# Patient Record
Sex: Female | Born: 1960 | Race: White | Hispanic: No | Smoking: Current every day smoker
Health system: Southern US, Community
[De-identification: ages and names within clinical notes are randomized; demographics above are authoritative.]

## PROBLEM LIST (undated history)

## (undated) DIAGNOSIS — K219 Gastro-esophageal reflux disease without esophagitis: Secondary | ICD-10-CM

## (undated) DIAGNOSIS — R0602 Shortness of breath: Secondary | ICD-10-CM

## (undated) DIAGNOSIS — Z8669 Personal history of other diseases of the nervous system and sense organs: Secondary | ICD-10-CM

## (undated) DIAGNOSIS — J449 Chronic obstructive pulmonary disease, unspecified: Secondary | ICD-10-CM

## (undated) DIAGNOSIS — K08109 Complete loss of teeth, unspecified cause, unspecified class: Secondary | ICD-10-CM

## (undated) DIAGNOSIS — F1010 Alcohol abuse, uncomplicated: Secondary | ICD-10-CM

## (undated) DIAGNOSIS — Z972 Presence of dental prosthetic device (complete) (partial): Secondary | ICD-10-CM

## (undated) DIAGNOSIS — B1920 Unspecified viral hepatitis C without hepatic coma: Secondary | ICD-10-CM

## (undated) DIAGNOSIS — M1990 Unspecified osteoarthritis, unspecified site: Secondary | ICD-10-CM

## (undated) DIAGNOSIS — Z8614 Personal history of Methicillin resistant Staphylococcus aureus infection: Secondary | ICD-10-CM

## (undated) DIAGNOSIS — F419 Anxiety disorder, unspecified: Secondary | ICD-10-CM

## (undated) DIAGNOSIS — G4700 Insomnia, unspecified: Secondary | ICD-10-CM

## (undated) HISTORY — PX: OTHER SURGICAL HISTORY: SHX169

## (undated) HISTORY — PX: SHOULDER SURGERY: SHX246

---

## 1988-12-21 HISTORY — PX: HAND SURGERY: SHX662

## 1988-12-21 HISTORY — PX: ORIF ULNAR / RADIAL SHAFT FRACTURE: SUR966

## 2005-06-29 ENCOUNTER — Emergency Department (HOSPITAL_COMMUNITY): Admission: EM | Admit: 2005-06-29 | Discharge: 2005-06-29 | Payer: Self-pay | Admitting: Emergency Medicine

## 2005-07-03 ENCOUNTER — Emergency Department (HOSPITAL_COMMUNITY): Admission: EM | Admit: 2005-07-03 | Discharge: 2005-07-03 | Payer: Self-pay | Admitting: Emergency Medicine

## 2005-12-06 ENCOUNTER — Emergency Department (HOSPITAL_COMMUNITY): Admission: EM | Admit: 2005-12-06 | Discharge: 2005-12-06 | Payer: Self-pay | Admitting: Emergency Medicine

## 2006-02-18 ENCOUNTER — Emergency Department (HOSPITAL_COMMUNITY): Admission: EM | Admit: 2006-02-18 | Discharge: 2006-02-18 | Payer: Self-pay | Admitting: Emergency Medicine

## 2008-07-08 ENCOUNTER — Emergency Department (HOSPITAL_BASED_OUTPATIENT_CLINIC_OR_DEPARTMENT_OTHER): Admission: EM | Admit: 2008-07-08 | Discharge: 2008-07-08 | Payer: Self-pay | Admitting: Emergency Medicine

## 2008-07-16 ENCOUNTER — Emergency Department (HOSPITAL_BASED_OUTPATIENT_CLINIC_OR_DEPARTMENT_OTHER): Admission: EM | Admit: 2008-07-16 | Discharge: 2008-07-16 | Payer: Self-pay | Admitting: Emergency Medicine

## 2008-10-18 ENCOUNTER — Emergency Department (HOSPITAL_BASED_OUTPATIENT_CLINIC_OR_DEPARTMENT_OTHER): Admission: EM | Admit: 2008-10-18 | Discharge: 2008-10-18 | Payer: Self-pay | Admitting: Emergency Medicine

## 2008-12-04 ENCOUNTER — Emergency Department (HOSPITAL_BASED_OUTPATIENT_CLINIC_OR_DEPARTMENT_OTHER): Admission: EM | Admit: 2008-12-04 | Discharge: 2008-12-04 | Payer: Self-pay | Admitting: Emergency Medicine

## 2009-05-26 ENCOUNTER — Emergency Department (HOSPITAL_BASED_OUTPATIENT_CLINIC_OR_DEPARTMENT_OTHER): Admission: EM | Admit: 2009-05-26 | Discharge: 2009-05-26 | Payer: Self-pay | Admitting: Emergency Medicine

## 2009-05-26 ENCOUNTER — Ambulatory Visit: Payer: Self-pay | Admitting: Diagnostic Radiology

## 2009-05-27 ENCOUNTER — Encounter: Admission: RE | Admit: 2009-05-27 | Discharge: 2009-05-27 | Payer: Self-pay | Admitting: Emergency Medicine

## 2009-06-03 ENCOUNTER — Emergency Department (HOSPITAL_BASED_OUTPATIENT_CLINIC_OR_DEPARTMENT_OTHER): Admission: EM | Admit: 2009-06-03 | Discharge: 2009-06-03 | Payer: Self-pay | Admitting: Emergency Medicine

## 2010-05-17 ENCOUNTER — Ambulatory Visit: Payer: Self-pay | Admitting: Diagnostic Radiology

## 2010-05-17 ENCOUNTER — Emergency Department (HOSPITAL_BASED_OUTPATIENT_CLINIC_OR_DEPARTMENT_OTHER): Admission: EM | Admit: 2010-05-17 | Discharge: 2010-05-17 | Payer: Self-pay | Admitting: Emergency Medicine

## 2010-05-21 ENCOUNTER — Emergency Department (HOSPITAL_COMMUNITY): Admission: EM | Admit: 2010-05-21 | Discharge: 2010-05-21 | Payer: Self-pay | Admitting: Family Medicine

## 2011-03-30 LAB — BASIC METABOLIC PANEL
CO2: 26 mEq/L (ref 19–32)
Creatinine, Ser: 0.7 mg/dL (ref 0.4–1.2)
GFR calc Af Amer: >60 mL/min (ref >60)
GFR calc non Af Amer: >60 mL/min (ref >60)
Glucose, Bld: 87 mg/dL (ref 70–99)

## 2011-03-30 LAB — POCT CARDIAC MARKERS
CKMB, poc: <1 ng/mL — ABNORMAL LOW (ref 1.0–8.0)
Myoglobin, poc: 19.7 ng/mL (ref 12–200)
Troponin i, poc: <0.05 ng/mL (ref 0.00–0.09)

## 2011-03-30 LAB — DIFFERENTIAL
Basophils Relative: 1 % (ref 0–1)
Eosinophils Relative: 2 % (ref 0–5)
Lymphocytes Relative: 25 % (ref 12–46)
Monocytes Relative: 6 % (ref 3–12)
Neutro Abs: 7.1 10*3/uL (ref 1.7–7.7)
Neutrophils Relative %: 66 % (ref 43–77)

## 2011-03-30 LAB — CBC
MCV: 96.6 fL (ref 78.0–100.0)
Platelets: 280 10*3/uL (ref 150–400)
RBC: 3.96 MIL/uL (ref 3.87–5.11)
RDW: 12.5 % (ref 11.5–15.5)
WBC: 10.8 10*3/uL — ABNORMAL HIGH (ref 4.0–10.5)

## 2013-08-02 ENCOUNTER — Emergency Department (HOSPITAL_BASED_OUTPATIENT_CLINIC_OR_DEPARTMENT_OTHER)
Admission: EM | Admit: 2013-08-02 | Discharge: 2013-08-02 | Disposition: A | Discharge status: Home / Self Care | Payer: Self-pay | Attending: Emergency Medicine | Admitting: Emergency Medicine

## 2013-08-02 ENCOUNTER — Encounter (HOSPITAL_BASED_OUTPATIENT_CLINIC_OR_DEPARTMENT_OTHER): Payer: Self-pay | Admitting: *Deleted

## 2013-08-02 DIAGNOSIS — F17200 Nicotine dependence, unspecified, uncomplicated: Secondary | ICD-10-CM | POA: Insufficient documentation

## 2013-08-02 DIAGNOSIS — J449 Chronic obstructive pulmonary disease, unspecified: Secondary | ICD-10-CM | POA: Insufficient documentation

## 2013-08-02 DIAGNOSIS — R112 Nausea with vomiting, unspecified: Secondary | ICD-10-CM | POA: Insufficient documentation

## 2013-08-02 DIAGNOSIS — Z79899 Other long term (current) drug therapy: Secondary | ICD-10-CM | POA: Insufficient documentation

## 2013-08-02 DIAGNOSIS — IMO0002 Reserved for concepts with insufficient information to code with codable children: Secondary | ICD-10-CM | POA: Insufficient documentation

## 2013-08-02 DIAGNOSIS — Z23 Encounter for immunization: Secondary | ICD-10-CM | POA: Insufficient documentation

## 2013-08-02 DIAGNOSIS — J4489 Other specified chronic obstructive pulmonary disease: Secondary | ICD-10-CM | POA: Insufficient documentation

## 2013-08-02 DIAGNOSIS — L0291 Cutaneous abscess, unspecified: Secondary | ICD-10-CM

## 2013-08-02 HISTORY — DX: Chronic obstructive pulmonary disease, unspecified: J44.9

## 2013-08-02 MED ORDER — OXYCODONE-ACETAMINOPHEN 5-325 MG PO TABS
2.0000 | ORAL_TABLET | Freq: Once | ORAL | Status: AC
  Administered 2013-08-02: 2 via ORAL
  Filled 2013-08-02 (×2): qty 2

## 2013-08-02 MED ORDER — SULFAMETHOXAZOLE-TRIMETHOPRIM 800-160 MG PO TABS: 1.0000 | ORAL_TABLET | Freq: Two times a day (BID) | ORAL | Status: DC

## 2013-08-02 MED ORDER — SULFAMETHOXAZOLE-TMP DS 800-160 MG PO TABS
1.0000 | ORAL_TABLET | Freq: Once | ORAL | Status: AC
  Administered 2013-08-02: 1 via ORAL
  Filled 2013-08-02: qty 1

## 2013-08-02 MED ORDER — ONDANSETRON 8 MG PO TBDP
8.0000 mg | ORAL_TABLET | Freq: Once | ORAL | Status: AC
  Administered 2013-08-02: 8 mg via ORAL
  Filled 2013-08-02: qty 1

## 2013-08-02 MED ORDER — LIDOCAINE HCL (PF) 1 % IJ SOLN
INTRAMUSCULAR | Status: AC
  Administered 2013-08-02: 05:00:00
  Filled 2013-08-02: qty 10

## 2013-08-02 MED ORDER — OXYCODONE-ACETAMINOPHEN 5-325 MG PO TABS: 2.0000 | ORAL_TABLET | ORAL | Status: DC | PRN

## 2013-08-02 MED ORDER — HYDROMORPHONE HCL PF 1 MG/ML IJ SOLN
1.0000 mg | Freq: Once | INTRAMUSCULAR | Status: AC
  Administered 2013-08-02: 1 mg via INTRAMUSCULAR
  Filled 2013-08-02: qty 1

## 2013-08-02 MED ORDER — TETANUS-DIPHTH-ACELL PERTUSSIS 5-2.5-18.5 LF-MCG/0.5 IM SUSP
0.5000 mL | Freq: Once | INTRAMUSCULAR | Status: AC
  Administered 2013-08-02: 0.5 mL via INTRAMUSCULAR
  Filled 2013-08-02: qty 0.5

## 2013-08-02 NOTE — ED Notes (Signed)
rx x 2 given for septra and percocet- pt has a ride

## 2013-08-02 NOTE — ED Provider Notes (Signed)
CSN: [id number]     Arrival date & time 08/02/13  4098 History     First MD Initiated Contact with Patient 08/02/13 0325     Chief Complaint  Patient presents with  . Wound Infection   (Consider location/radiation/quality/duration/timing/severity/associated sxs/prior Treatment) HPI Comments: Patient presents with a sore on her right arm. She says that it started out as a small pimple and then has progressed over the last 3 days. She's had some surrounding redness. She denies he fevers or chills. She's had some nausea and vomiting associated with the pain. She denies any history of skin infections in the past. She's unsure when her last tetanus shot was.   Past Medical History  Diagnosis Date  . COPD (chronic obstructive pulmonary disease)    Past Surgical History  Procedure Laterality Date  . Arm surgery    . Hand surgery    . Shoulder surgery     No family history on file. History  Substance Use Topics  . Smoking status: Current Every Day Smoker    Types: Cigarettes  . Smokeless tobacco: Never Used  . Alcohol Use: 1.8 oz/week    3 Glasses of wine per week   OB History   Grav Para Term Preterm Abortions TAB SAB Ect Mult Living                 Review of Systems  Constitutional: Negative for fever.  Gastrointestinal: Positive for nausea and vomiting.  Musculoskeletal: Negative for arthralgias.  Skin: Positive for wound. Negative for rash.  Neurological: Negative for dizziness and headaches.    Allergies  Review of patient's allergies indicates no known allergies.  Home Medications   Current Outpatient Rx  Name  Route  Sig  Dispense  Refill  . albuterol-ipratropium (COMBIVENT) 18-103 MCG/ACT inhaler   Inhalation   Inhale 2 puffs into the lungs every 6 (six) hours as needed for wheezing.         . tiotropium (SPIRIVA) 18 MCG inhalation capsule   Inhalation   Place 18 mcg into inhaler and inhale daily.         Marland Kitchen oxyCODONE-acetaminophen (PERCOCET) 5-325 MG  per tablet   Oral   Take 2 tablets by mouth every 4 (four) hours as needed for pain.   20 tablet   0   . sulfamethoxazole-trimethoprim (BACTRIM DS,SEPTRA DS) 800-160 MG per tablet   Oral   Take 1 tablet by mouth 2 (two) times daily. One po bid x 10 days   20 tablet   0    BP 121/93  Pulse 82  Temp(Src) 98.8 F (37.1 C) (Oral)  Resp 22  Ht 5' (1.524 m)  Wt 125 lb (56.7 kg)  BMI 24.41 kg/m2  SpO2 96% Physical Exam  Constitutional: She is oriented to person, place, and time. She appears well-developed and well-nourished.  Cardiovascular: Normal rate.   Pulmonary/Chest: Effort normal.  Neurological: She is alert and oriented to person, place, and time.  Skin: Skin is warm and dry.  There is a 2 cm indurated fluctuant area to the palmar surface of the right mid forearm. There is surrounding area. The amount about 4 cm in diameter. There is no streaking up the arm.    ED Course   INCISION AND DRAINAGE Date/Time: 08/02/2013 4:19 AM Performed by: Jestin Burbach Authorized by: Rolan Bucco Consent: Verbal consent obtained. Risks and benefits: risks, benefits and alternatives were discussed Consent given by: patient Type: abscess Body area: upper extremity Location details: right  arm Anesthesia: local infiltration Local anesthetic: lidocaine 1% without epinephrine Anesthetic total: 2 ml Patient sedated: no Scalpel size: 11 Incision type: elliptical Drainage: purulent Drainage amount: moderate Wound treatment: wound left open Patient tolerance: Patient tolerated the procedure well with no immediate complications.   (including critical care time)  Labs Reviewed - No data to display No results found. 1. Abscess     MDM  Patient started on antibiotics. She was given wound care instructions. Her tetanus shot was updated. She was advised to return in 2 days if her symptoms are not improved or sooner if she has any worsening redness or streaking up her arm.  Rolan Bucco, MD 08/02/13 [phone]

## 2013-08-02 NOTE — ED Notes (Signed)
Wound to right forearm x 3 days

## 2014-01-06 ENCOUNTER — Emergency Department (HOSPITAL_BASED_OUTPATIENT_CLINIC_OR_DEPARTMENT_OTHER): Payer: Self-pay

## 2014-01-06 ENCOUNTER — Emergency Department (HOSPITAL_BASED_OUTPATIENT_CLINIC_OR_DEPARTMENT_OTHER)
Admission: EM | Admit: 2014-01-06 | Discharge: 2014-01-06 | Disposition: A | Discharge status: Home / Self Care | Payer: Self-pay | Attending: Emergency Medicine | Admitting: Emergency Medicine

## 2014-01-06 ENCOUNTER — Encounter (HOSPITAL_BASED_OUTPATIENT_CLINIC_OR_DEPARTMENT_OTHER): Payer: Self-pay | Admitting: Emergency Medicine

## 2014-01-06 DIAGNOSIS — R079 Chest pain, unspecified: Secondary | ICD-10-CM | POA: Insufficient documentation

## 2014-01-06 DIAGNOSIS — Z79899 Other long term (current) drug therapy: Secondary | ICD-10-CM | POA: Insufficient documentation

## 2014-01-06 DIAGNOSIS — R609 Edema, unspecified: Secondary | ICD-10-CM | POA: Insufficient documentation

## 2014-01-06 DIAGNOSIS — J449 Chronic obstructive pulmonary disease, unspecified: Secondary | ICD-10-CM | POA: Insufficient documentation

## 2014-01-06 DIAGNOSIS — J4489 Other specified chronic obstructive pulmonary disease: Secondary | ICD-10-CM | POA: Insufficient documentation

## 2014-01-06 DIAGNOSIS — R0781 Pleurodynia: Secondary | ICD-10-CM

## 2014-01-06 DIAGNOSIS — F17200 Nicotine dependence, unspecified, uncomplicated: Secondary | ICD-10-CM | POA: Insufficient documentation

## 2014-01-06 MED ORDER — HYDROCODONE-ACETAMINOPHEN 5-325 MG PO TABS
1.0000 | ORAL_TABLET | Freq: Once | ORAL | Status: AC
  Administered 2014-01-06: 1 via ORAL
  Filled 2014-01-06: qty 1

## 2014-01-06 MED ORDER — HYDROCODONE-ACETAMINOPHEN 5-325 MG PO TABS: 1.0000 | ORAL_TABLET | Freq: Four times a day (QID) | ORAL | Status: DC | PRN
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
## 2014-01-06 NOTE — Discharge Instructions (Signed)
As discussed, it is important that you follow up with your physician for continued management of your condition.  In addition to the provided medication, please take ibuprofen, 800mg , three times daily for three days.  If you develop any new, or concerning changes in your condition, please return to the emergency department immediately.
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
## 2014-04-23 ENCOUNTER — Emergency Department (HOSPITAL_BASED_OUTPATIENT_CLINIC_OR_DEPARTMENT_OTHER)
Admission: EM | Admit: 2014-04-23 | Discharge: 2014-04-23 | Disposition: A | Discharge status: Home / Self Care | Payer: Self-pay | Attending: Emergency Medicine | Admitting: Emergency Medicine

## 2014-04-23 ENCOUNTER — Emergency Department (HOSPITAL_BASED_OUTPATIENT_CLINIC_OR_DEPARTMENT_OTHER): Payer: Self-pay

## 2014-04-23 ENCOUNTER — Encounter (HOSPITAL_BASED_OUTPATIENT_CLINIC_OR_DEPARTMENT_OTHER): Payer: Self-pay | Admitting: Emergency Medicine

## 2014-04-23 DIAGNOSIS — S301XXA Contusion of abdominal wall, initial encounter: Secondary | ICD-10-CM | POA: Insufficient documentation

## 2014-04-23 DIAGNOSIS — F411 Generalized anxiety disorder: Secondary | ICD-10-CM | POA: Insufficient documentation

## 2014-04-23 DIAGNOSIS — S3011XA Contusion of abdominal wall, initial encounter: Secondary | ICD-10-CM

## 2014-04-23 DIAGNOSIS — R071 Chest pain on breathing: Secondary | ICD-10-CM | POA: Insufficient documentation

## 2014-04-23 DIAGNOSIS — S060X9A Concussion with loss of consciousness of unspecified duration, initial encounter: Secondary | ICD-10-CM | POA: Insufficient documentation

## 2014-04-23 DIAGNOSIS — Z79899 Other long term (current) drug therapy: Secondary | ICD-10-CM | POA: Insufficient documentation

## 2014-04-23 DIAGNOSIS — S4980XA Other specified injuries of shoulder and upper arm, unspecified arm, initial encounter: Secondary | ICD-10-CM | POA: Insufficient documentation

## 2014-04-23 DIAGNOSIS — S52202A Unspecified fracture of shaft of left ulna, initial encounter for closed fracture: Secondary | ICD-10-CM

## 2014-04-23 DIAGNOSIS — Y9389 Activity, other specified: Secondary | ICD-10-CM | POA: Insufficient documentation

## 2014-04-23 DIAGNOSIS — Z792 Long term (current) use of antibiotics: Secondary | ICD-10-CM | POA: Insufficient documentation

## 2014-04-23 DIAGNOSIS — IMO0002 Reserved for concepts with insufficient information to code with codable children: Secondary | ICD-10-CM | POA: Insufficient documentation

## 2014-04-23 DIAGNOSIS — S0280XA Fracture of other specified skull and facial bones, unspecified side, initial encounter for closed fracture: Secondary | ICD-10-CM | POA: Insufficient documentation

## 2014-04-23 DIAGNOSIS — S20219A Contusion of unspecified front wall of thorax, initial encounter: Secondary | ICD-10-CM

## 2014-04-23 DIAGNOSIS — J441 Chronic obstructive pulmonary disease with (acute) exacerbation: Secondary | ICD-10-CM | POA: Insufficient documentation

## 2014-04-23 DIAGNOSIS — Y92410 Unspecified street and highway as the place of occurrence of the external cause: Secondary | ICD-10-CM | POA: Insufficient documentation

## 2014-04-23 DIAGNOSIS — S5000XA Contusion of unspecified elbow, initial encounter: Secondary | ICD-10-CM | POA: Insufficient documentation

## 2014-04-23 DIAGNOSIS — S52209A Unspecified fracture of shaft of unspecified ulna, initial encounter for closed fracture: Secondary | ICD-10-CM | POA: Insufficient documentation

## 2014-04-23 DIAGNOSIS — S199XXA Unspecified injury of neck, initial encounter: Secondary | ICD-10-CM

## 2014-04-23 DIAGNOSIS — F17200 Nicotine dependence, unspecified, uncomplicated: Secondary | ICD-10-CM | POA: Insufficient documentation

## 2014-04-23 DIAGNOSIS — S0990XA Unspecified injury of head, initial encounter: Secondary | ICD-10-CM

## 2014-04-23 DIAGNOSIS — S139XXA Sprain of joints and ligaments of unspecified parts of neck, initial encounter: Secondary | ICD-10-CM | POA: Insufficient documentation

## 2014-04-23 DIAGNOSIS — S0292XA Unspecified fracture of facial bones, initial encounter for closed fracture: Secondary | ICD-10-CM

## 2014-04-23 DIAGNOSIS — S46909A Unspecified injury of unspecified muscle, fascia and tendon at shoulder and upper arm level, unspecified arm, initial encounter: Secondary | ICD-10-CM | POA: Insufficient documentation

## 2014-04-23 DIAGNOSIS — Z9889 Other specified postprocedural states: Secondary | ICD-10-CM | POA: Insufficient documentation

## 2014-04-23 DIAGNOSIS — S0993XA Unspecified injury of face, initial encounter: Secondary | ICD-10-CM | POA: Insufficient documentation

## 2014-04-23 DIAGNOSIS — S161XXA Strain of muscle, fascia and tendon at neck level, initial encounter: Secondary | ICD-10-CM

## 2014-04-23 LAB — CBC WITH DIFFERENTIAL/PLATELET
BASOS PCT: 0 % (ref 0–1)
Basophils Absolute: 0 10*3/uL (ref 0.0–0.1)
EOS ABS: 0 10*3/uL (ref 0.0–0.7)
EOS PCT: 1 % (ref 0–5)
HCT: 39 % (ref 36.0–46.0)
HEMOGLOBIN: 13.2 g/dL (ref 12.0–15.0)
Lymphocytes Relative: 25 % (ref 12–46)
Lymphs Abs: 1.4 10*3/uL (ref 0.7–4.0)
MCH: 37.2 pg — AB (ref 26.0–34.0)
MCHC: 33.8 g/dL (ref 30.0–36.0)
MCV: 109.9 fL — AB (ref 78.0–100.0)
MONO ABS: 0.7 10*3/uL (ref 0.1–1.0)
MONOS PCT: 11 % (ref 3–12)
NEUTROS PCT: 64 % (ref 43–77)
Neutro Abs: 3.7 10*3/uL (ref 1.7–7.7)
Platelets: 212 10*3/uL (ref 150–400)
RBC: 3.55 MIL/uL — ABNORMAL LOW (ref 3.87–5.11)
RDW: 13 % (ref 11.5–15.5)
WBC: 5.8 10*3/uL (ref 4.0–10.5)

## 2014-04-23 LAB — COMPREHENSIVE METABOLIC PANEL
ALBUMIN: 3.9 g/dL (ref 3.5–5.2)
ALT: 70 U/L — ABNORMAL HIGH (ref 0–35)
AST: 158 U/L — ABNORMAL HIGH (ref 0–37)
Alkaline Phosphatase: 68 U/L (ref 39–117)
BUN: 10 mg/dL (ref 6–23)
CALCIUM: 8.9 mg/dL (ref 8.4–10.5)
CO2: 22 mEq/L (ref 19–32)
CREATININE: 0.7 mg/dL (ref 0.50–1.10)
Chloride: 101 mEq/L (ref 96–112)
GFR calc non Af Amer: >90 mL/min (ref >90)
GLUCOSE: 104 mg/dL — AB (ref 70–99)
POTASSIUM: 4.7 meq/L (ref 3.7–5.3)
Sodium: 143 mEq/L (ref 137–147)
TOTAL PROTEIN: 7.5 g/dL (ref 6.0–8.3)
Total Bilirubin: 0.3 mg/dL (ref 0.3–1.2)

## 2014-04-23 MED ORDER — SODIUM CHLORIDE 0.9 % IV BOLUS (SEPSIS)
1000.0000 mL | Freq: Once | INTRAVENOUS | Status: AC
  Administered 2014-04-23: 1000 mL via INTRAVENOUS

## 2014-04-23 MED ORDER — MORPHINE SULFATE 4 MG/ML IJ SOLN
4.0000 mg | Freq: Once | INTRAMUSCULAR | Status: AC
  Administered 2014-04-23: 4 mg via INTRAVENOUS

## 2014-04-23 MED ORDER — OXYCODONE-ACETAMINOPHEN 5-325 MG PO TABS: 1.0000 | ORAL_TABLET | ORAL | Status: DC | PRN

## 2014-04-23 MED ORDER — MORPHINE SULFATE 4 MG/ML IJ SOLN
4.0000 mg | Freq: Once | INTRAMUSCULAR | Status: AC
Start: 2014-04-23 — End: 2014-04-23
  Administered 2014-04-23: 4 mg via INTRAVENOUS
  Filled 2014-04-23: qty 1

## 2014-04-23 MED ORDER — MORPHINE SULFATE 4 MG/ML IJ SOLN
INTRAMUSCULAR | Status: AC
  Filled 2014-04-23: qty 1

## 2014-04-23 MED ORDER — ALBUTEROL SULFATE (2.5 MG/3ML) 0.083% IN NEBU
5.0000 mg | INHALATION_SOLUTION | Freq: Once | RESPIRATORY_TRACT | Status: AC
  Administered 2014-04-23: 5 mg via RESPIRATORY_TRACT
  Filled 2014-04-23: qty 6

## 2014-04-23 MED ORDER — ONDANSETRON HCL 4 MG/2ML IJ SOLN
4.0000 mg | Freq: Once | INTRAMUSCULAR | Status: AC
  Administered 2014-04-23: 4 mg via INTRAVENOUS
  Filled 2014-04-23: qty 2

## 2014-04-23 MED ORDER — MORPHINE SULFATE 4 MG/ML IJ SOLN
4.0000 mg | Freq: Once | INTRAMUSCULAR | Status: AC
  Administered 2014-04-23: 4 mg via INTRAVENOUS
  Filled 2014-04-23: qty 1

## 2014-04-23 MED ORDER — IOHEXOL 300 MG/ML  SOLN
100.0000 mL | Freq: Once | INTRAMUSCULAR | Status: AC | PRN
  Administered 2014-04-23: 100 mL via INTRAVENOUS

## 2014-04-23 NOTE — ED Notes (Signed)
Pt placed on bp, cardiac and pulse ox monitoring.  

## 2014-04-23 NOTE — ED Notes (Signed)
Pt back from xray and ct.

## 2014-04-23 NOTE — ED Notes (Signed)
Mouth swab offered per request d/t npo status.

## 2014-04-23 NOTE — ED Notes (Signed)
Patient transported to X-ray and ct via stretcher.

## 2014-04-23 NOTE — ED Notes (Addendum)
Called fiance to update regarding pts status per pt request.  Blanket placed under left arm for elevation and comfort.
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
## 2014-04-23 NOTE — ED Notes (Signed)
Pt requesting something to drink, notified to remain npo until testing completed.  Verbalized understanding.
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
## 2014-04-23 NOTE — Discharge Instructions (Signed)
Followup with Dr. Izora Ribasoley for your arm fracture, Dr. Burgess Estelleanner for your eye injury, and Dr. Annalee GentaShoemaker for your facial fractures.  Call to arrange followup appointments with these individuals. The contact information has been provided on this discharge summary   Blunt Trauma You have been evaluated for injuries. You have been examined and your caregiver has not found injuries serious enough to require hospitalization. It is common to have multiple bruises and sore muscles following an accident. These tend to feel worse for the first 24 hours. You will feel more stiffness and soreness over the next several hours and worse when you wake up the first morning after your accident. After this point, you should begin to improve with each passing day. The amount of improvement depends on the amount of damage done in the accident. Following your accident, if some part of your body does not work as it should, or if the pain in any area continues to increase, you should return to the Emergency Department for re-evaluation.  HOME CARE INSTRUCTIONS  Routine care for sore areas should include:  Ice to sore areas every 2 hours for 20 minutes while awake for the next 2 days.  Drink extra fluids (not alcohol).  Take a hot or warm shower or bath once or twice a day to increase blood flow to sore muscles. This will help you "limber up".  Activity as tolerated. Lifting may aggravate neck or back pain.  Only take over-the-counter or prescription medicines for pain, discomfort, or fever as directed by your caregiver. Do not use aspirin. This may increase bruising or increase bleeding if there are small areas where this is happening. SEEK IMMEDIATE MEDICAL CARE IF:  Numbness, tingling, weakness, or problem with the use of your arms or legs.  A severe headache is not relieved with medications.  There is a change in bowel or bladder control.  Increasing pain in any areas of the body.  Short of breath or  dizzy.  Nauseated, vomiting, or sweating.  Increasing belly (abdominal) discomfort.  Blood in urine, stool, or vomiting blood.  Pain in either shoulder in an area where a shoulder strap would be.  Feelings of lightheadedness or if you have a fainting episode. Sometimes it is not possible to identify all injuries immediately after the trauma. It is important that you continue to monitor your condition after the emergency department visit. If you feel you are not improving, or improving more slowly than should be expected, call your physician. If you feel your symptoms (problems) are worsening, return to the Emergency Department immediately. Document Released: 09/02/2001 Document Revised: 02/29/2012 Document Reviewed: 07/25/2008 [facility]ExitCare Patient Information 2014 [place], [place].  Facial Fracture A facial fracture is a break in one of the bones of your face. HOME CARE INSTRUCTIONS   Protect the injured part of your face until it is healed.  Do not participate in activities which give chance for re-injury until your doctor approves.  Gently wash and dry your face.  Wear head and facial protection while riding a bicycle, motorcycle, or snowmobile. SEEK MEDICAL CARE IF:   An oral temperature above 102 F (38.9 C) develops.  You have severe headaches or notice changes in your vision.  You have new numbness or tingling in your face.  You develop nausea (feeling sick to your stomach), vomiting or a stiff neck. SEEK IMMEDIATE MEDICAL CARE IF:   You develop difficulty seeing or experience double vision.  You become dizzy, lightheaded, or faint.  You develop trouble  speaking, breathing, or swallowing.  You have a watery discharge from your nose or ear. MAKE SURE YOU:   Understand these instructions.  Will watch your condition.  Will get help right away if you are not doing well or get worse. Document Released: 12/07/2005 Document Revised: 02/29/2012 Document Reviewed:  07/26/2008 [facility] LtdExitCare Patient Information 2014 [place], [place].  Forearm Fracture Your caregiver has diagnosed you as having a broken bone (fracture) of the forearm. This is the part of your arm between the elbow and your wrist. Your forearm is made up of two bones. These are the radius and ulna. A fracture is a break in one or both bones. A cast or splint is used to protect and keep your injured bone from moving. The cast or splint will be on generally for about 5 to 6 weeks, with individual variations. HOME CARE INSTRUCTIONS   Keep the injured part elevated while sitting or lying down. Keeping the injury above the level of your heart (the center of the chest). This will decrease swelling and pain.  Apply ice to the injury for 15-20 minutes, 03-04 times per day while awake, for 2 days. Put the ice in a plastic bag and place a thin towel between the bag of ice and your cast or splint.  If you have a plaster or fiberglass cast:  Do not try to scratch the skin under the cast using sharp or pointed objects.  Check the skin around the cast every day. You may put lotion on any red or sore areas.  Keep your cast dry and clean.  If you have a plaster splint:  Wear the splint as directed.  You may loosen the elastic around the splint if your fingers become numb, tingle, or turn cold or blue.  Do not put pressure on any part of your cast or splint. It may break. Rest your cast only on a pillow the first 24 hours until it is fully hardened.  Your cast or splint can be protected during bathing with a plastic bag. Do not lower the cast or splint into water.  Only take over-the-counter or prescription medicines for pain, discomfort, or fever as directed by your caregiver. SEEK IMMEDIATE MEDICAL CARE IF:   Your cast gets damaged or breaks.  You have more severe pain or swelling than you did before the cast.  Your skin or nails below the injury turn blue or gray, or feel cold or numb.  There is  a bad smell or new stains and/or pus like (purulent) drainage coming from under the cast. MAKE SURE YOU:   Understand these instructions.  Will watch your condition.  Will get help right away if you are not doing well or get worse. Document Released: 12/04/2000 Document Revised: 02/29/2012 Document Reviewed: 07/26/2008 [facility] HealthExit[facility] Patient Information 2014 [place], [place].

## 2014-04-23 NOTE — ED Notes (Signed)
Patient transported to CT via stretcher per tech. 

## 2014-04-30 ENCOUNTER — Encounter (HOSPITAL_BASED_OUTPATIENT_CLINIC_OR_DEPARTMENT_OTHER): Payer: Self-pay | Admitting: Emergency Medicine

## 2014-04-30 ENCOUNTER — Emergency Department (HOSPITAL_BASED_OUTPATIENT_CLINIC_OR_DEPARTMENT_OTHER): Payer: Self-pay

## 2014-04-30 ENCOUNTER — Emergency Department (HOSPITAL_BASED_OUTPATIENT_CLINIC_OR_DEPARTMENT_OTHER)
Admission: EM | Admit: 2014-04-30 | Discharge: 2014-04-30 | Disposition: A | Discharge status: Home / Self Care | Payer: Self-pay | Attending: Emergency Medicine | Admitting: Emergency Medicine

## 2014-04-30 DIAGNOSIS — Y929 Unspecified place or not applicable: Secondary | ICD-10-CM | POA: Insufficient documentation

## 2014-04-30 DIAGNOSIS — Z79899 Other long term (current) drug therapy: Secondary | ICD-10-CM | POA: Insufficient documentation

## 2014-04-30 DIAGNOSIS — J4489 Other specified chronic obstructive pulmonary disease: Secondary | ICD-10-CM | POA: Insufficient documentation

## 2014-04-30 DIAGNOSIS — Z792 Long term (current) use of antibiotics: Secondary | ICD-10-CM | POA: Insufficient documentation

## 2014-04-30 DIAGNOSIS — J449 Chronic obstructive pulmonary disease, unspecified: Secondary | ICD-10-CM | POA: Insufficient documentation

## 2014-04-30 DIAGNOSIS — S52209A Unspecified fracture of shaft of unspecified ulna, initial encounter for closed fracture: Secondary | ICD-10-CM | POA: Insufficient documentation

## 2014-04-30 DIAGNOSIS — S52202A Unspecified fracture of shaft of left ulna, initial encounter for closed fracture: Secondary | ICD-10-CM

## 2014-04-30 DIAGNOSIS — W1809XA Striking against other object with subsequent fall, initial encounter: Secondary | ICD-10-CM | POA: Insufficient documentation

## 2014-04-30 DIAGNOSIS — F17200 Nicotine dependence, unspecified, uncomplicated: Secondary | ICD-10-CM | POA: Insufficient documentation

## 2014-04-30 DIAGNOSIS — Y9389 Activity, other specified: Secondary | ICD-10-CM | POA: Insufficient documentation

## 2014-04-30 MED ORDER — OXYCODONE-ACETAMINOPHEN 5-325 MG PO TABS
1.0000 | ORAL_TABLET | Freq: Once | ORAL | Status: AC
  Administered 2014-04-30: 1 via ORAL
  Filled 2014-04-30: qty 1

## 2014-04-30 MED ORDER — IPRATROPIUM BROMIDE 0.02 % IN SOLN
0.5000 mg | Freq: Once | RESPIRATORY_TRACT | Status: AC
  Administered 2014-04-30: 0.5 mg via RESPIRATORY_TRACT
  Filled 2014-04-30: qty 2.5

## 2014-04-30 MED ORDER — ALBUTEROL SULFATE (2.5 MG/3ML) 0.083% IN NEBU
5.0000 mg | INHALATION_SOLUTION | Freq: Once | RESPIRATORY_TRACT | Status: AC
  Administered 2014-04-30: 5 mg via RESPIRATORY_TRACT
  Filled 2014-04-30: qty 6
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
## 2014-04-30 NOTE — ED Notes (Signed)
[facility] [address] Ponzo [address]

## 2014-04-30 NOTE — Discharge Instructions (Signed)
Forearm Fracture  The forearm is between your elbow and your wrist. It has two bones (ulna and radius). A fracture is a break in one or both of these bones.  HOME CARE  · Raise (elevate) your arm above the level of the heart.  · Put ice on the injured area.  · Put ice in a plastic bag.  · Place a towel between the skin and the bag.  · Leave the ice on for 15-20 minutes, 03-04 times a day.  · If given a plaster or fiberglass cast:  · Do not try to scratch the skin under the cast with sharp or pointed objects.  · Check the skin around the cast every day. You may put lotion on any red or sore areas.  · Keep the cast dry and clean.  · If given a plaster splint:  · Wear the splint as told.  · You may loosen the elastic around the splint if the fingers become numb, tingle, or turn cold or blue.  · Do not put pressure on any part of the cast or splint. It may break. Rest the cast only on a pillow the first 24 hours until it is fully hardened.  · The cast or splint can be protected during bathing with a plastic bag. Do not lower the cast or splint into water.  · Only take medicine as told by your doctor.  GET HELP RIGHT AWAY IF:   · The cast gets damaged or breaks.  · You have pain or puffiness (swelling).  · The skin or nails below the injury turn blue or gray, or feel cold or numb.  · There is a bad smell, new stains, or fluid coming from under the cast.  MAKE SURE YOU:   · Understand these instructions.  · Will watch your condition.  · Will get help right away if you are not doing well or get worse.  Document Released: 05/25/2008 Document Revised: 02/29/2012 Document Reviewed: 05/25/2008  [employer]® Patient Information ©2014 [employer].
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
## 2014-05-22 ENCOUNTER — Encounter (HOSPITAL_BASED_OUTPATIENT_CLINIC_OR_DEPARTMENT_OTHER): Payer: Self-pay | Admitting: *Deleted

## 2014-05-22 NOTE — Progress Notes (Signed)
Pt has copd-had neb tx labs and ekg er 04/23/14-to bring aLL MEDS AND INHALERS -a neighbor taking her home post op

## 2014-05-23 ENCOUNTER — Encounter (HOSPITAL_BASED_OUTPATIENT_CLINIC_OR_DEPARTMENT_OTHER): Payer: Self-pay | Admitting: Certified Registered"

## 2014-05-23 ENCOUNTER — Encounter (HOSPITAL_BASED_OUTPATIENT_CLINIC_OR_DEPARTMENT_OTHER)
Admission: RE | Disposition: A | Discharge status: Home / Self Care | Payer: Self-pay | Source: Ambulatory Visit | Attending: Orthopedic Surgery

## 2014-05-23 ENCOUNTER — Ambulatory Visit (HOSPITAL_BASED_OUTPATIENT_CLINIC_OR_DEPARTMENT_OTHER)
Admission: RE | Admit: 2014-05-23 | Discharge: 2014-05-23 | Disposition: A | Discharge status: Home / Self Care | Payer: Self-pay | Source: Ambulatory Visit | Attending: Orthopedic Surgery | Admitting: Orthopedic Surgery

## 2014-05-23 ENCOUNTER — Ambulatory Visit (HOSPITAL_BASED_OUTPATIENT_CLINIC_OR_DEPARTMENT_OTHER): Payer: Self-pay | Admitting: Certified Registered"

## 2014-05-23 DIAGNOSIS — K219 Gastro-esophageal reflux disease without esophagitis: Secondary | ICD-10-CM | POA: Insufficient documentation

## 2014-05-23 DIAGNOSIS — F411 Generalized anxiety disorder: Secondary | ICD-10-CM | POA: Insufficient documentation

## 2014-05-23 DIAGNOSIS — J4489 Other specified chronic obstructive pulmonary disease: Secondary | ICD-10-CM | POA: Insufficient documentation

## 2014-05-23 DIAGNOSIS — X58XXXA Exposure to other specified factors, initial encounter: Secondary | ICD-10-CM | POA: Insufficient documentation

## 2014-05-23 DIAGNOSIS — M129 Arthropathy, unspecified: Secondary | ICD-10-CM | POA: Insufficient documentation

## 2014-05-23 DIAGNOSIS — Z79899 Other long term (current) drug therapy: Secondary | ICD-10-CM | POA: Insufficient documentation

## 2014-05-23 DIAGNOSIS — S52209A Unspecified fracture of shaft of unspecified ulna, initial encounter for closed fracture: Secondary | ICD-10-CM | POA: Insufficient documentation

## 2014-05-23 DIAGNOSIS — J449 Chronic obstructive pulmonary disease, unspecified: Secondary | ICD-10-CM | POA: Insufficient documentation

## 2014-05-23 DIAGNOSIS — F17200 Nicotine dependence, unspecified, uncomplicated: Secondary | ICD-10-CM | POA: Insufficient documentation

## 2014-05-23 DIAGNOSIS — G4700 Insomnia, unspecified: Secondary | ICD-10-CM | POA: Insufficient documentation

## 2014-05-23 DIAGNOSIS — Y929 Unspecified place or not applicable: Secondary | ICD-10-CM | POA: Insufficient documentation

## 2014-05-23 HISTORY — DX: Insomnia, unspecified: G47.00

## 2014-05-23 HISTORY — DX: Unspecified osteoarthritis, unspecified site: M19.90

## 2014-05-23 HISTORY — DX: Gastro-esophageal reflux disease without esophagitis: K21.9

## 2014-05-23 HISTORY — DX: Complete loss of teeth, unspecified cause, unspecified class: K08.109

## 2014-05-23 HISTORY — DX: Shortness of breath: R06.02

## 2014-05-23 HISTORY — DX: Anxiety disorder, unspecified: F41.9

## 2014-05-23 HISTORY — PX: ORIF ULNAR FRACTURE: SHX5417

## 2014-05-23 HISTORY — DX: Complete loss of teeth, unspecified cause, unspecified class: Z97.2

## 2014-05-23 SURGERY — OPEN REDUCTION INTERNAL FIXATION (ORIF) ULNAR FRACTURE
Anesthesia: Monitor Anesthesia Care | Laterality: Left

## 2014-05-23 MED ORDER — CHLORHEXIDINE GLUCONATE 4 % EX LIQD: 60.0000 mL | Freq: Once | CUTANEOUS | Status: DC

## 2014-05-23 MED ORDER — OXYCODONE-ACETAMINOPHEN 5-325 MG PO TABS: 1.0000 | ORAL_TABLET | Freq: Four times a day (QID) | ORAL | Status: DC | PRN

## 2014-05-23 MED ORDER — MIDAZOLAM HCL 2 MG/2ML IJ SOLN
INTRAMUSCULAR | Status: AC
  Filled 2014-05-23: qty 2

## 2014-05-23 MED ORDER — LIDOCAINE HCL 1 % IJ SOLN
INTRAMUSCULAR | Status: DC | PRN
  Administered 2014-05-23: 2 mL via INTRADERMAL

## 2014-05-23 MED ORDER — PROPOFOL 10 MG/ML IV BOLUS
INTRAVENOUS | Status: DC | PRN
  Administered 2014-05-23 (×2): 20 mg via INTRAVENOUS

## 2014-05-23 MED ORDER — CEFAZOLIN SODIUM-DEXTROSE 2-3 GM-% IV SOLR
INTRAVENOUS | Status: AC
  Filled 2014-05-23: qty 50

## 2014-05-23 MED ORDER — FENTANYL CITRATE 0.05 MG/ML IJ SOLN
INTRAMUSCULAR | Status: AC
  Filled 2014-05-23: qty 2

## 2014-05-23 MED ORDER — ROPIVACAINE HCL 5 MG/ML IJ SOLN
INTRAMUSCULAR | Status: DC | PRN
  Administered 2014-05-23: 40 mL via PERINEURAL

## 2014-05-23 MED ORDER — ONDANSETRON HCL 4 MG/2ML IJ SOLN
INTRAMUSCULAR | Status: DC | PRN
  Administered 2014-05-23: 4 mg via INTRAVENOUS

## 2014-05-23 MED ORDER — METOCLOPRAMIDE HCL 5 MG/ML IJ SOLN: 10.0000 mg | Freq: Once | INTRAMUSCULAR | Status: DC | PRN

## 2014-05-23 MED ORDER — OXYCODONE HCL 5 MG/5ML PO SOLN: 5.0000 mg | Freq: Once | ORAL | Status: DC | PRN

## 2014-05-23 MED ORDER — CEFAZOLIN SODIUM 1-5 GM-% IV SOLN
1.0000 g | Freq: Once | INTRAVENOUS | Status: AC
  Administered 2014-05-23: 1 g via INTRAVENOUS

## 2014-05-23 MED ORDER — LIDOCAINE HCL (CARDIAC) 20 MG/ML IV SOLN
INTRAVENOUS | Status: DC | PRN
  Administered 2014-05-23: 25 mg via INTRAVENOUS

## 2014-05-23 MED ORDER — HYDROMORPHONE HCL PF 1 MG/ML IJ SOLN
0.2500 mg | INTRAMUSCULAR | Status: DC | PRN
  Administered 2014-05-23: 0.5 mg via INTRAVENOUS

## 2014-05-23 MED ORDER — LACTATED RINGERS IV SOLN
INTRAVENOUS | Status: DC
  Administered 2014-05-23 (×2): via INTRAVENOUS

## 2014-05-23 MED ORDER — BUPIVACAINE-EPINEPHRINE (PF) 0.5% -1:200000 IJ SOLN
INTRAMUSCULAR | Status: AC
  Filled 2014-05-23: qty 30

## 2014-05-23 MED ORDER — MIDAZOLAM HCL 5 MG/5ML IJ SOLN
INTRAMUSCULAR | Status: DC | PRN
  Administered 2014-05-23: 2 mg via INTRAVENOUS

## 2014-05-23 MED ORDER — FENTANYL CITRATE 0.05 MG/ML IJ SOLN
INTRAMUSCULAR | Status: DC | PRN
  Administered 2014-05-23 (×6): 25 ug via INTRAVENOUS

## 2014-05-23 MED ORDER — MIDAZOLAM HCL 2 MG/2ML IJ SOLN
1.0000 mg | INTRAMUSCULAR | Status: DC | PRN
  Administered 2014-05-23: 1 mg via INTRAVENOUS
  Administered 2014-05-23: 2 mg via INTRAVENOUS
  Administered 2014-05-23: 1 mg via INTRAVENOUS

## 2014-05-23 MED ORDER — HYDROMORPHONE HCL PF 1 MG/ML IJ SOLN
INTRAMUSCULAR | Status: AC
  Filled 2014-05-23: qty 1

## 2014-05-23 MED ORDER — FENTANYL CITRATE 0.05 MG/ML IJ SOLN
INTRAMUSCULAR | Status: AC
  Filled 2014-05-23: qty 6

## 2014-05-23 MED ORDER — FENTANYL CITRATE 0.05 MG/ML IJ SOLN
50.0000 ug | INTRAMUSCULAR | Status: DC | PRN
  Administered 2014-05-23: 100 ug via INTRAVENOUS

## 2014-05-23 MED ORDER — OXYCODONE HCL 5 MG PO TABS: 5.0000 mg | ORAL_TABLET | Freq: Once | ORAL | Status: DC | PRN

## 2014-05-23 MED ORDER — PROPOFOL INFUSION 10 MG/ML OPTIME
INTRAVENOUS | Status: DC | PRN
  Administered 2014-05-23: 200 ug/kg/min via INTRAVENOUS

## 2014-05-23 MED ORDER — CEFAZOLIN SODIUM-DEXTROSE 2-3 GM-% IV SOLR
INTRAVENOUS | Status: DC | PRN
  Administered 2014-05-23: 2 g via INTRAVENOUS

## 2014-05-23 MED ORDER — CEFAZOLIN SODIUM-DEXTROSE 2-3 GM-% IV SOLR: 2.0000 g | INTRAVENOUS | Status: DC

## 2014-05-23 MED ORDER — CEFAZOLIN SODIUM 1-5 GM-% IV SOLN
INTRAVENOUS | Status: AC
  Filled 2014-05-23: qty 50

## 2014-05-23 SURGICAL SUPPLY — 69 items
BANDAGE ELASTIC 3 VELCRO ST LF (GAUZE/BANDAGES/DRESSINGS) ×4 IMPLANT
BIT DRILL 2.0 (BIT) ×2
BIT DRILL 2.0MM (BIT) ×1
BIT DRILL 2.5X2.75 QC CALB (BIT) ×2 IMPLANT
BIT DRILL 2XNS DISP SS SM FRAG (BIT) IMPLANT
BIT DRILL CALIBRATED 2.7 (BIT) ×1 IMPLANT
BIT DRILL CALIBRATED 2.7MM (BIT) ×1
BIT DRL 2XNS DISP SS SM FRAG (BIT) ×1
BLADE 15 SAFETY STRL DISP (BLADE) ×1 IMPLANT
BNDG CMPR 9X4 STRL LF SNTH (GAUZE/BANDAGES/DRESSINGS) ×1
BNDG ESMARK 4X9 LF (GAUZE/BANDAGES/DRESSINGS) ×2 IMPLANT
CANISTER SUCT 1200ML W/VALVE (MISCELLANEOUS) ×2 IMPLANT
CLOSURE WOUND 1/4X4 (GAUZE/BANDAGES/DRESSINGS)
COVER MAYO STAND STRL (DRAPES) ×3 IMPLANT
COVER TABLE BACK 60X90 (DRAPES) ×3 IMPLANT
CUFF TOURNIQUET SINGLE 18IN (TOURNIQUET CUFF) ×2 IMPLANT
DECANTER SPIKE VIAL GLASS SM (MISCELLANEOUS) IMPLANT
DRAPE EXTREMITY T 121X128X90 (DRAPE) ×3 IMPLANT
DRAPE OEC MINIVIEW 54X84 (DRAPES) ×3 IMPLANT
DRAPE SURG 17X23 STRL (DRAPES) ×3 IMPLANT
DRAPE U-SHAPE 47X51 STRL (DRAPES) IMPLANT
DRSG EMULSION OIL 3X3 NADH (GAUZE/BANDAGES/DRESSINGS) ×3 IMPLANT
DURAPREP 26ML APPLICATOR (WOUND CARE) ×3 IMPLANT
ELECT NDL TIP 2.8 STRL (NEEDLE) IMPLANT
ELECT NEEDLE TIP 2.8 STRL (NEEDLE) IMPLANT
ELECT REM PT RETURN 9FT ADLT (ELECTROSURGICAL) ×3
ELECTRODE REM PT RTRN 9FT ADLT (ELECTROSURGICAL) IMPLANT
GAUZE SPONGE 4X4 12PLY STRL (GAUZE/BANDAGES/DRESSINGS) ×3 IMPLANT
GLOVE BIOGEL PI IND STRL 7.0 (GLOVE) IMPLANT
GLOVE BIOGEL PI IND STRL 8 (GLOVE) ×2 IMPLANT
GLOVE BIOGEL PI INDICATOR 7.0 (GLOVE) ×2
GLOVE BIOGEL PI INDICATOR 8 (GLOVE) ×4
GLOVE ECLIPSE 6.5 STRL STRAW (GLOVE) ×2 IMPLANT
GLOVE ECLIPSE 7.5 STRL STRAW (GLOVE) ×6 IMPLANT
GLOVE EXAM NITRILE LRG STRL (GLOVE) ×2 IMPLANT
GOWN STRL REUS W/ TWL LRG LVL3 (GOWN DISPOSABLE) ×1 IMPLANT
GOWN STRL REUS W/ TWL XL LVL3 (GOWN DISPOSABLE) ×1 IMPLANT
GOWN STRL REUS W/TWL LRG LVL3 (GOWN DISPOSABLE) ×3
GOWN STRL REUS W/TWL XL LVL3 (GOWN DISPOSABLE) ×6 IMPLANT
NDL HYPO 25X1 1.5 SAFETY (NEEDLE) IMPLANT
NEEDLE HYPO 25X1 1.5 SAFETY (NEEDLE) IMPLANT
NS IRRIG 1000ML POUR BTL (IV SOLUTION) ×3 IMPLANT
PACK BASIN DAY SURGERY FS (CUSTOM PROCEDURE TRAY) ×3 IMPLANT
PAD CAST 3X4 CTTN HI CHSV (CAST SUPPLIES) ×1 IMPLANT
PADDING CAST ABS 4INX4YD NS (CAST SUPPLIES) ×2
PADDING CAST ABS COTTON 4X4 ST (CAST SUPPLIES) ×1 IMPLANT
PADDING CAST COTTON 3X4 STRL (CAST SUPPLIES) ×3
PENCIL BUTTON HOLSTER BLD 10FT (ELECTRODE) ×2 IMPLANT
PLATE LOCK COMP 7H FOOT (Plate) ×2 IMPLANT
SCREW CORTICAL 2.7MM  20MM (Screw) ×2 IMPLANT
SCREW CORTICAL 2.7MM 20MM (Screw) IMPLANT
SCREW CORTICAL 3.5MM  16MM (Screw) ×2 IMPLANT
SCREW CORTICAL 3.5MM 16MM (Screw) IMPLANT
SCREW CORTICAL 3.5MM 18MM (Screw) ×2 IMPLANT
SCREW LOCK CORT STAR 3.5X12 (Screw) ×8 IMPLANT
SCREW LOCK CORT STAR 3.5X14 (Screw) ×2 IMPLANT
SCREW LOCK CORT STAR 3.5X16 (Screw) ×2 IMPLANT
SPLINT PLASTER CAST XFAST 4X15 (CAST SUPPLIES) IMPLANT
SPLINT PLASTER XTRA FAST SET 4 (CAST SUPPLIES) ×4
STOCKINETTE 4X48 STRL (DRAPES) ×3 IMPLANT
STRIP CLOSURE SKIN 1/4X4 (GAUZE/BANDAGES/DRESSINGS) ×1 IMPLANT
SUCTION FRAZIER TIP 10 FR DISP (SUCTIONS) ×2 IMPLANT
SYR BULB 3OZ (MISCELLANEOUS) ×3 IMPLANT
SYR CONTROL 10ML LL (SYRINGE) IMPLANT
TOWEL OR 17X24 6PK STRL BLUE (TOWEL DISPOSABLE) ×7 IMPLANT
TOWEL OR NON WOVEN STRL DISP B (DISPOSABLE) ×3 IMPLANT
TUBE CONNECTING 20'X1/4 (TUBING) ×1
TUBE CONNECTING 20X1/4 (TUBING) ×1 IMPLANT
UNDERPAD 30X30 INCONTINENT (UNDERPADS AND DIAPERS) ×3 IMPLANT

## 2014-05-23 NOTE — Anesthesia Postprocedure Evaluation (Signed)
Anesthesia Post Note  Patient: Rhonda Mitchell  Procedure(s) Performed: Procedure(s) (LRB): OPEN REDUCTION INTERNAL FIXATION (ORIF) LEFT ULNAR FRACTURE (Left)  Anesthesia type: MAC  Patient location: PACU  Post pain: Pain level controlled  Post assessment: Patient's Cardiovascular Status Stable  Last Vitals:  Filed Vitals:   05/23/14 1330  BP: 137/122  Pulse: 62  Temp:   Resp: 18    Post vital signs: Reviewed and stable  Level of consciousness: alert  Complications: No apparent anesthesia complications

## 2014-05-23 NOTE — H&P (Signed)
PREOPERATIVE H&P  Chief Complaint: l arm pain  HPI: Rhonda Mitchell is a 53 y.o. female who presents for evaluation of l arm pain. It has been present for 4 weeks and has been worsening. She has failed conservative measures as pt has fractured ulna with ongoing pain even with splinting. Pain is rated as severe.  Past Medical History  Diagnosis Date  . COPD (chronic obstructive pulmonary disease)   . Anxiety   . Shortness of breath   . Arthritis   . GERD (gastroesophageal reflux disease)   . Full dentures   . Insomnia    Past Surgical History  Procedure Laterality Date  . Arm surgery    . Hand surgery  1990    both rt/lt carpal tunnels  . Shoulder surgery      right and left-age [age]  . Orif ulnar / radial shaft fracture  1990    right-got infection-had total 10 surgeies 1990   History   Social History  . Marital Status: Legally Separated    Spouse Name: N/A    Number of Children: N/A  . Years of Education: N/A   Social History Main Topics  . Smoking status: Current Every Day Smoker -- 1.00 packs/day    Types: Cigarettes  . Smokeless tobacco: Never Used  . Alcohol Use: 1.8 oz/week    3 Glasses of wine per week  . Drug Use: No  . Sexual Activity: Yes    Birth Control/ Protection: Post-menopausal   Other Topics Concern  . None   Social History Narrative  . None   History reviewed. No pertinent family history. No Known Allergies Prior to Admission medications   Medication Sig Start Date End Date Taking? Authorizing Provider  albuterol-ipratropium (COMBIVENT) 18-103 MCG/ACT inhaler Inhale 2 puffs into the lungs every 6 (six) hours as needed for wheezing.   Yes Historical Provider, MD  b complex vitamins tablet Take 1 tablet by mouth daily.   Yes Historical Provider, MD  budesonide-formoterol (SYMBICORT) 80-4.5 MCG/ACT inhaler Inhale 2 puffs into the lungs 2 (two) times daily.   Yes Historical Provider, MD  diphenhydramine-acetaminophen (TYLENOL PM) 25-500 MG  TABS Take 1 tablet by mouth at bedtime as needed.   Yes Historical Provider, MD  DULoxetine (CYMBALTA) 60 MG capsule Take 60 mg by mouth daily.   Yes Historical Provider, MD  HYDROcodone-acetaminophen (NORCO/VICODIN) 5-325 MG per tablet Take 1-2 tablets by mouth every 6 (six) hours as needed for moderate pain. 01/06/14  Yes Gerhard Munchobert Lockwood, MD  loratadine (CLARITIN) 10 MG tablet Take 10 mg by mouth daily.   Yes Historical Provider, MD  magnesium citrate SOLN Take 1 Bottle by mouth once.   Yes Historical Provider, MD  oxyCODONE-acetaminophen (PERCOCET) 5-325 MG per tablet Take 1-2 tablets by mouth every 4 (four) hours as needed. 04/23/14  Yes Geoffery Lyonsouglas Delo, MD     Positive ROS: none  All other systems have been reviewed and were otherwise negative with the exception of those mentioned in the HPI and as above.  Physical Exam: Filed Vitals:   05/23/14 1030  BP: 161/89  Pulse: 69  Temp:   Resp: 23    General: Alert, no acute distress Cardiovascular: No pedal edema Respiratory: No cyanosis, no use of accessory musculature GI: No organomegaly, abdomen is soft and non-tender Skin: No lesions in the area of chief complaint Neurologic: Sensation intact distally Psychiatric: Patient is competent for consent with normal mood and affect Lymphatic: No axillary or cervical lymphadenopathy  MUSCULOSKELETAL: l arm  painful rotation  X-ray: displaced ulna fx Assessment/Plan: painful left forearm s/p midshaft ulna fracture Plan for Procedure(s): OPEN REDUCTION INTERNAL FIXATION (ORIF) LEFT ULNAR FRACTURE  The risks benefits and alternatives were discussed with the patient including but not limited to the risks of nonoperative treatment, versus surgical intervention including infection, bleeding, nerve injury, malunion, nonunion, hardware prominence, hardware failure, need for hardware removal, blood clots, cardiopulmonary complications, morbidity, mortality, among others, and they were willing to  proceed.  Predicted outcome is good, although there will be at least a six to nine month expected recovery.  Harvie Junior, MD 05/23/2014 11:16 AM

## 2014-05-23 NOTE — Anesthesia Procedure Notes (Addendum)
Anesthesia Regional Block:  Axillary brachial plexus block  Pre-Anesthetic Checklist: ,, timeout performed, Correct Patient, Correct Site, Correct Laterality, Correct Procedure, Correct Position, site marked, Risks and benefits discussed,  Surgical consent,  Pre-op evaluation,  At surgeon's request and post-op pain management  Laterality: Left  Prep: chloraprep       Needles:   Needle Type: Other     Needle Length: 9cm 9 cm Needle Gauge: 21 and 21 G    Additional Needles:  Procedures: ultrasound guided (picture in chart) Axillary brachial plexus block Narrative:  Start time: 05/23/2014 9:38 AM End time: 05/23/2014 9:48 AM Injection made incrementally with aspirations every 5 mL.  Performed by: Personally    Procedure Name: MAC Date/Time: 05/23/2014 11:43 AM Performed by: [place] Pre-anesthesia Checklist: Patient identified, Emergency Drugs available, Suction available and Patient being monitored Patient Re-evaluated:Patient Re-evaluated prior to inductionOxygen Delivery Method: Simple face mask Preoxygenation: Pre-oxygenation with 100% oxygen Placement Confirmation: positive ETCO2

## 2014-05-23 NOTE — Discharge Instructions (Signed)
Wear sling and elevate left arm above heart.    Post Anesthesia Home Care Instructions  Activity: Get plenty of rest for the remainder of the day. A responsible adult should stay with you for 24 hours following the procedure.  For the next 24 hours, DO NOT: -Drive a car -Advertising copywriter -Drink alcoholic beverages -Take any medication unless instructed by your physician -Make any legal decisions or sign important papers.  Meals: Start with liquid foods such as gelatin or soup. Progress to regular foods as tolerated. Avoid greasy, spicy, heavy foods. If nausea and/or vomiting occur, drink only clear liquids until the nausea and/or vomiting subsides. Call your physician if vomiting continues.  Special Instructions/Symptoms: Your throat may feel dry or sore from the anesthesia or the breathing tube placed in your throat during surgery. If this causes discomfort, gargle with warm salt water. The discomfort should disappear within 24 hours.  Regional Anesthesia Blocks  1. Numbness or the inability to move the "blocked" extremity may last from 3-48 hours after placement. The length of time depends on the medication injected and your individual response to the medication. If the numbness is not going away after 48 hours, call your surgeon.  2. The extremity that is blocked will need to be protected until the numbness is gone and the  Strength has returned. Because you cannot feel it, you will need to take extra care to avoid injury. Because it may be weak, you may have difficulty moving it or using it. You may not know what position it is in without looking at it while the block is in effect.  3. For blocks in the legs and feet, returning to weight bearing and walking needs to be done carefully. You will need to wait until the numbness is entirely gone and the strength has returned. You should be able to move your leg and foot normally before you try and bear weight or walk. You will need  someone to be with you when you first try to ensure you do not fall and possibly risk injury.  4. Bruising and tenderness at the needle site are common side effects and will resolve in a few days.  5. Persistent numbness or new problems with movement should be communicated to the surgeon or the [facility] [phone] [facility] [phone]).

## 2014-05-23 NOTE — Brief Op Note (Signed)
05/23/2014  1:11 PM  PATIENT:  [name]  53 y.o. female  PRE-OPERATIVE DIAGNOSIS:  painful left forearm s/p midshaft ulna fracture  POST-OPERATIVE DIAGNOSIS:  painful left forearm s/p midshaft ulna fracture  PROCEDURE:  Procedure(s): OPEN REDUCTION INTERNAL FIXATION (ORIF) LEFT ULNAR FRACTURE (Left)  SURGEON:  Surgeon(s) and Role:    * Harvie Junior, MD - Primary  PHYSICIAN ASSISTANT:   ASSISTANTS: bethune   ANESTHESIA:   MAC  EBL:  Total I/O In: 900 [I.V.:900] Out: -   BLOOD ADMINISTERED:none  DRAINS: none   LOCAL MEDICATIONS USED:  NONE  SPECIMEN:  No Specimen  DISPOSITION OF SPECIMEN:  N/A  COUNTS:  YES  TOURNIQUET:    DICTATION: .Other Dictation: Dictation Number [id number]  PLAN OF CARE: Discharge to home after PACU  PATIENT DISPOSITION:  PACU - hemodynamically stable.   Delay start of Pharmacological VTE agent (>24hrs) due to surgical blood loss or risk of bleeding: no

## 2014-05-23 NOTE — Transfer of Care (Signed)
Immediate Anesthesia Transfer of Care Note  Patient: Rhonda Mitchell  Procedure(s) Performed: Procedure(s): OPEN REDUCTION INTERNAL FIXATION (ORIF) LEFT ULNAR FRACTURE (Left)  Patient Location: PACU  Anesthesia Type:MAC combined with regional for post-op pain  Level of Consciousness: awake, alert , oriented and patient cooperative  Airway & Oxygen Therapy: Patient Spontanous Breathing and Patient connected to face mask oxygen  Post-op Assessment: Report given to PACU RN and Post -op Vital signs reviewed and stable  Post vital signs: Reviewed and stable  Complications: No apparent anesthesia complications

## 2014-05-23 NOTE — Progress Notes (Signed)
Assisted Dr. Gelene Mink with left, ultrasound guided, axillary block. Side rails up, monitors on throughout procedure. See vital signs in flow sheet. Tolerated Procedure well.

## 2014-05-23 NOTE — Anesthesia Preprocedure Evaluation (Addendum)
Anesthesia Evaluation  Patient identified by MRN, date of birth, ID band Patient awake    Reviewed: Allergy & Precautions, H&P , NPO status , Patient's Chart, lab work & pertinent test results, reviewed documented beta blocker date and time   Airway Mallampati: II TM Distance: >3 FB Neck ROM: full    Dental   Pulmonary neg pulmonary ROS, shortness of breath and with exertion, COPDCurrent Smoker,  breath sounds clear to auscultation        Cardiovascular negative cardio ROS  Rhythm:regular     Neuro/Psych negative neurological ROS  negative psych ROS   GI/Hepatic Neg liver ROS, GERD-  Medicated and Controlled,  Endo/Other  negative endocrine ROS  Renal/GU negative Renal ROS  negative genitourinary   Musculoskeletal   Abdominal   Peds  Hematology negative hematology ROS (+)   Anesthesia Other Findings See surgeon's H&P   Reproductive/Obstetrics negative OB ROS                          Anesthesia Physical Anesthesia Plan  ASA: III  Anesthesia Plan: Regional and MAC   Post-op Pain Management:    Induction: Intravenous  Airway Management Planned: Simple Face Mask  Additional Equipment:   Intra-op Plan:   Post-operative Plan:   Informed Consent: I have reviewed the patients History and Physical, chart, labs and discussed the procedure including the risks, benefits and alternatives for the proposed anesthesia with the patient or authorized representative who has indicated his/her understanding and acceptance.   Dental Advisory Given  Plan Discussed with: CRNA and Surgeon  Anesthesia Plan Comments:        Anesthesia Quick Evaluation

## 2014-05-24 ENCOUNTER — Encounter (HOSPITAL_BASED_OUTPATIENT_CLINIC_OR_DEPARTMENT_OTHER): Payer: Self-pay | Admitting: Orthopedic Surgery

## 2014-05-24 NOTE — Op Note (Signed)
NAMENARELY, DEGRAW            ACCOUNT NO.:  [id number]  MEDICAL RECORD NO.:  [id number]  LOCATION:                                 FACILITY:  PHYSICIAN:  Harvie Junior, M.D.   DATE OF BIRTH:  10-29-1961  DATE OF PROCEDURE:  05/23/2014 DATE OF DISCHARGE:  05/23/2014                              OPERATIVE REPORT   PREOPERATIVE DIAGNOSIS:  Nonunited comminuted humerus fracture.  POSTOPERATIVE DIAGNOSIS:  Nonunited comminuted humerus fracture.  PROCEDURE:  Open reduction and internal fixation of a nonunited humerus fracture.  SURGEON:  Harvie Junior, M.D.  ASSISTANT:  Marshia Ly, P.A.  ANESTHESIA:  General.  BRIEF HISTORY:  Ms. Virrueta is a 53 year old female with a history of having had a displaced comminuted ulnar fracture.  We saw her in the office and felt that open reduction and internal fixation was appropriate.  We talked about potential for nonoperative treatment.  We treated her with a sugar-tong splint for several weeks.  She was getting no evidence of significant healing and she was having worsening pain. Her x-ray showed displacement of the ulna by greater than 50% of the width of the shaft and as well as a comminuted piece.  She is a smoker, and we felt that internal fixation was appropriate.  We encouraged her stopping smoking and we brought her to the operating room for open reduction and internal fixation.  DESCRIPTION OF PROCEDURE:  The patient was taken to the operating room. After adequate anesthesia was obtained with MAC and block, she was placed supine on the operating table.  The left arm was then prepped and draped in usual sterile fashion.  Following this, the arm was exsanguinated, blood pressure tourniquet inflated to 250 mmHg. Following this, a linear incision was made in the subcutaneous tissue down to the level of the ulna and the ulna was identified and the __________ were debrided.  There was no evidence of early healing of this  fracture.  The comminuted piece was completely free and loose. This was taken and debrided and ultimately, we were able to finally achieve what was __________ as good reduction as we could achieve with a level of healing that had happened and smoothing off of the fractured edges and a compressive 7-hole plate was used to put a compressive plate __________ neutral plate initially and then a compressive hole and this gave good compression across the fracture site and 2 locking screws proximally and distally.  Once this was achieved, the butterfly fragmented piece had been wedged under the plate was in good position. The wound was irrigated, suctioned dry.  Fluoro images were used to make sure that we had a good compression plating of the fracture and at this point, the __________ periosteum over the bone was closed completely and then the skin with 0 and 2-0 Vicryl and staples.  Sterile pressure dressing was applied.  The patient was taken to the recovery room and she was noted to be in satisfactory condition.  Estimated blood loss for the procedure was none.  Multiple intraoperative fluoroscopic images were taken to assess the adequacy of reduction as well as the plate position.     Harvie Junior, M.D.  JLG/MEDQ  D:  05/23/2014  T:  05/24/2014  Job:  [id number]

## 2014-11-30 ENCOUNTER — Emergency Department (HOSPITAL_COMMUNITY): Payer: Self-pay

## 2014-11-30 ENCOUNTER — Inpatient Hospital Stay (HOSPITAL_COMMUNITY)
Admission: EM | Admit: 2014-11-30 | Discharge: 2014-12-01 | DRG: 313 | Discharge status: Against Medical Advice | Payer: Self-pay | Attending: Internal Medicine | Admitting: Internal Medicine

## 2014-11-30 ENCOUNTER — Encounter (HOSPITAL_COMMUNITY): Payer: Self-pay | Admitting: Emergency Medicine

## 2014-11-30 DIAGNOSIS — L0291 Cutaneous abscess, unspecified: Secondary | ICD-10-CM

## 2014-11-30 DIAGNOSIS — R000 Tachycardia, unspecified: Secondary | ICD-10-CM | POA: Diagnosis present

## 2014-11-30 DIAGNOSIS — J45909 Unspecified asthma, uncomplicated: Secondary | ICD-10-CM | POA: Diagnosis present

## 2014-11-30 DIAGNOSIS — G4700 Insomnia, unspecified: Secondary | ICD-10-CM | POA: Diagnosis present

## 2014-11-30 DIAGNOSIS — K219 Gastro-esophageal reflux disease without esophagitis: Secondary | ICD-10-CM | POA: Diagnosis present

## 2014-11-30 DIAGNOSIS — L0231 Cutaneous abscess of buttock: Secondary | ICD-10-CM | POA: Diagnosis present

## 2014-11-30 DIAGNOSIS — E860 Dehydration: Secondary | ICD-10-CM

## 2014-11-30 DIAGNOSIS — F419 Anxiety disorder, unspecified: Secondary | ICD-10-CM | POA: Diagnosis present

## 2014-11-30 DIAGNOSIS — L0390 Cellulitis, unspecified: Secondary | ICD-10-CM

## 2014-11-30 DIAGNOSIS — F1010 Alcohol abuse, uncomplicated: Secondary | ICD-10-CM | POA: Diagnosis present

## 2014-11-30 DIAGNOSIS — J449 Chronic obstructive pulmonary disease, unspecified: Secondary | ICD-10-CM | POA: Diagnosis present

## 2014-11-30 DIAGNOSIS — F17210 Nicotine dependence, cigarettes, uncomplicated: Secondary | ICD-10-CM | POA: Diagnosis present

## 2014-11-30 DIAGNOSIS — R0602 Shortness of breath: Secondary | ICD-10-CM

## 2014-11-30 DIAGNOSIS — M1990 Unspecified osteoarthritis, unspecified site: Secondary | ICD-10-CM | POA: Diagnosis present

## 2014-11-30 DIAGNOSIS — J42 Unspecified chronic bronchitis: Secondary | ICD-10-CM

## 2014-11-30 DIAGNOSIS — R079 Chest pain, unspecified: Principal | ICD-10-CM | POA: Diagnosis present

## 2014-11-30 DIAGNOSIS — E876 Hypokalemia: Secondary | ICD-10-CM | POA: Diagnosis present

## 2014-11-30 DIAGNOSIS — R0789 Other chest pain: Secondary | ICD-10-CM | POA: Insufficient documentation

## 2014-11-30 LAB — BASIC METABOLIC PANEL
Anion gap: 20 — ABNORMAL HIGH (ref 5–15)
BUN: 5 mg/dL — AB (ref 6–23)
CO2: 24 mEq/L (ref 19–32)
CREATININE: 0.54 mg/dL (ref 0.50–1.10)
Calcium: 8.6 mg/dL (ref 8.4–10.5)
Chloride: 95 mEq/L — ABNORMAL LOW (ref 96–112)
GFR calc non Af Amer: >90 mL/min (ref >90)
Glucose, Bld: 113 mg/dL — ABNORMAL HIGH (ref 70–99)
Potassium: 3.9 mEq/L (ref 3.7–5.3)
Sodium: 139 mEq/L (ref 137–147)

## 2014-11-30 LAB — CBC
HEMATOCRIT: 36.8 % (ref 36.0–46.0)
HEMATOCRIT: 31.1 % — AB (ref 36.0–46.0)
Hemoglobin: 12.1 g/dL (ref 12.0–15.0)
Hemoglobin: 10.2 g/dL — ABNORMAL LOW (ref 12.0–15.0)
MCH: 36.3 pg — ABNORMAL HIGH (ref 26.0–34.0)
MCH: 36.2 pg — ABNORMAL HIGH (ref 26.0–34.0)
MCHC: 32.9 g/dL (ref 30.0–36.0)
MCHC: 32.8 g/dL (ref 30.0–36.0)
MCV: 110.5 fL — AB (ref 78.0–100.0)
MCV: 110.3 fL — AB (ref 78.0–100.0)
PLATELETS: 246 10*3/uL (ref 150–400)
Platelets: 208 10*3/uL (ref 150–400)
RBC: 3.33 MIL/uL — ABNORMAL LOW (ref 3.87–5.11)
RBC: 2.82 MIL/uL — AB (ref 3.87–5.11)
RDW: 14.3 % (ref 11.5–15.5)
RDW: 14.5 % (ref 11.5–15.5)
WBC: 5.6 10*3/uL (ref 4.0–10.5)
WBC: 4.1 10*3/uL (ref 4.0–10.5)

## 2014-11-30 LAB — COMPREHENSIVE METABOLIC PANEL
ALT: 16 U/L (ref 0–35)
ANION GAP: 16 — AB (ref 5–15)
AST: 37 U/L (ref 0–37)
Albumin: 2.9 g/dL — ABNORMAL LOW (ref 3.5–5.2)
Alkaline Phosphatase: 113 U/L (ref 39–117)
BILIRUBIN TOTAL: 0.8 mg/dL (ref 0.3–1.2)
BUN: 8 mg/dL (ref 6–23)
CO2: 25 mEq/L (ref 19–32)
Calcium: 8.1 mg/dL — ABNORMAL LOW (ref 8.4–10.5)
Chloride: 97 mEq/L (ref 96–112)
Creatinine, Ser: 0.48 mg/dL — ABNORMAL LOW (ref 0.50–1.10)
GFR calc Af Amer: >90 mL/min (ref >90)
GFR calc non Af Amer: >90 mL/min (ref >90)
Glucose, Bld: 90 mg/dL (ref 70–99)
POTASSIUM: 3.9 meq/L (ref 3.7–5.3)
Sodium: 138 mEq/L (ref 137–147)
TOTAL PROTEIN: 7.7 g/dL (ref 6.0–8.3)

## 2014-11-30 LAB — MAGNESIUM: MAGNESIUM: 1.3 mg/dL — AB (ref 1.5–2.5)

## 2014-11-30 LAB — D-DIMER, QUANTITATIVE: D-Dimer, Quant: 3.1 ug/mL-FEU — ABNORMAL HIGH (ref 0.00–0.48)

## 2014-11-30 LAB — CREATININE, SERUM
Creatinine, Ser: 0.53 mg/dL (ref 0.50–1.10)
GFR calc non Af Amer: >90 mL/min (ref >90)

## 2014-11-30 LAB — I-STAT TROPONIN, ED: Troponin i, poc: 0.03 ng/mL (ref 0.00–0.08)

## 2014-11-30 LAB — ETHANOL: Alcohol, Ethyl (B): <11 mg/dL (ref 0–11)

## 2014-11-30 LAB — TROPONIN I

## 2014-11-30 LAB — PHOSPHORUS: PHOSPHORUS: 3.1 mg/dL (ref 2.3–4.6)

## 2014-11-30 MED ORDER — ONDANSETRON HCL 4 MG PO TABS
4.0000 mg | ORAL_TABLET | Freq: Four times a day (QID) | ORAL | Status: DC | PRN
Start: 2014-11-30 — End: 2014-12-01

## 2014-11-30 MED ORDER — GUAIFENESIN ER 600 MG PO TB12
600.0000 mg | ORAL_TABLET | Freq: Two times a day (BID) | ORAL | Status: DC
  Administered 2014-11-30 – 2014-12-01 (×2): 600 mg via ORAL
  Filled 2014-11-30 (×2): qty 1

## 2014-11-30 MED ORDER — ONDANSETRON HCL 4 MG/2ML IJ SOLN: 4.0000 mg | Freq: Four times a day (QID) | INTRAMUSCULAR | Status: DC | PRN

## 2014-11-30 MED ORDER — ACETAMINOPHEN 650 MG RE SUPP: 650.0000 mg | Freq: Four times a day (QID) | RECTAL | Status: DC | PRN

## 2014-11-30 MED ORDER — TRAMADOL HCL 50 MG PO TABS: 100.0000 mg | ORAL_TABLET | Freq: Four times a day (QID) | ORAL | Status: DC | PRN

## 2014-11-30 MED ORDER — BUDESONIDE-FORMOTEROL FUMARATE 80-4.5 MCG/ACT IN AERO
2.0000 | INHALATION_SPRAY | Freq: Two times a day (BID) | RESPIRATORY_TRACT | Status: DC
  Administered 2014-12-01 (×2): 2 via RESPIRATORY_TRACT
  Filled 2014-11-30: qty 6.9

## 2014-11-30 MED ORDER — ACETAMINOPHEN 325 MG PO TABS: 650.0000 mg | ORAL_TABLET | Freq: Four times a day (QID) | ORAL | Status: DC | PRN

## 2014-11-30 MED ORDER — MORPHINE SULFATE 4 MG/ML IJ SOLN
4.0000 mg | Freq: Once | INTRAMUSCULAR | Status: AC
  Administered 2014-11-30: 4 mg via INTRAVENOUS
  Filled 2014-11-30: qty 1

## 2014-11-30 MED ORDER — IPRATROPIUM BROMIDE 0.02 % IN SOLN
0.5000 mg | Freq: Four times a day (QID) | RESPIRATORY_TRACT | Status: DC
  Administered 2014-12-01 (×2): 0.5 mg via RESPIRATORY_TRACT
  Filled 2014-11-30 (×2): qty 2.5

## 2014-11-30 MED ORDER — SODIUM CHLORIDE 0.9 % IV SOLN
INTRAVENOUS | Status: AC
  Administered 2014-11-30: 23:00:00 via INTRAVENOUS

## 2014-11-30 MED ORDER — SODIUM CHLORIDE 0.9 % IV BOLUS (SEPSIS)
1000.0000 mL | Freq: Once | INTRAVENOUS | Status: AC
  Administered 2014-11-30: 1000 mL via INTRAVENOUS

## 2014-11-30 MED ORDER — VITAMIN B-1 100 MG PO TABS
100.0000 mg | ORAL_TABLET | Freq: Every day | ORAL | Status: DC
  Administered 2014-12-01: 100 mg via ORAL
  Filled 2014-11-30: qty 1

## 2014-11-30 MED ORDER — LORAZEPAM 2 MG/ML IJ SOLN
1.0000 mg | Freq: Once | INTRAMUSCULAR | Status: AC
  Administered 2014-11-30: 1 mg via INTRAVENOUS
  Filled 2014-11-30: qty 1

## 2014-11-30 MED ORDER — VANCOMYCIN HCL IN DEXTROSE 750-5 MG/150ML-% IV SOLN
750.0000 mg | Freq: Two times a day (BID) | INTRAVENOUS | Status: DC
  Administered 2014-11-30: 750 mg via INTRAVENOUS
  Filled 2014-11-30 (×3): qty 150

## 2014-11-30 MED ORDER — ALBUTEROL SULFATE (2.5 MG/3ML) 0.083% IN NEBU: 2.5000 mg | INHALATION_SOLUTION | RESPIRATORY_TRACT | Status: DC | PRN

## 2014-11-30 MED ORDER — DOCUSATE SODIUM 100 MG PO CAPS
100.0000 mg | ORAL_CAPSULE | Freq: Two times a day (BID) | ORAL | Status: DC
  Administered 2014-11-30 – 2014-12-01 (×2): 100 mg via ORAL
  Filled 2014-11-30 (×2): qty 1

## 2014-11-30 MED ORDER — DEXTROSE 5 % IV SOLN
1.0000 g | INTRAVENOUS | Status: DC
  Administered 2014-11-30: 1 g via INTRAVENOUS
  Filled 2014-11-30 (×2): qty 10

## 2014-11-30 MED ORDER — ASPIRIN EC 81 MG PO TBEC
81.0000 mg | DELAYED_RELEASE_TABLET | Freq: Every day | ORAL | Status: DC
  Administered 2014-12-01: 81 mg via ORAL
  Filled 2014-11-30: qty 1

## 2014-11-30 MED ORDER — HYDROCODONE-ACETAMINOPHEN 5-325 MG PO TABS
1.0000 | ORAL_TABLET | ORAL | Status: DC | PRN
  Administered 2014-11-30 – 2014-12-01 (×2): 2 via ORAL
  Administered 2014-12-01: 1 via ORAL
  Filled 2014-11-30 (×2): qty 2
  Filled 2014-11-30: qty 1

## 2014-11-30 MED ORDER — SODIUM CHLORIDE 0.9 % IJ SOLN: 3.0000 mL | Freq: Two times a day (BID) | INTRAMUSCULAR | Status: DC

## 2014-11-30 MED ORDER — ASPIRIN 81 MG PO CHEW: 324.0000 mg | CHEWABLE_TABLET | Freq: Once | ORAL | Status: DC

## 2014-11-30 MED ORDER — SODIUM CHLORIDE 0.9 % IV BOLUS (SEPSIS): 1000.0000 mL | Freq: Once | INTRAVENOUS | Status: DC

## 2014-11-30 MED ORDER — DULOXETINE HCL 60 MG PO CPEP
60.0000 mg | ORAL_CAPSULE | Freq: Every day | ORAL | Status: DC
  Administered 2014-12-01: 60 mg via ORAL
  Filled 2014-11-30: qty 1

## 2014-11-30 MED ORDER — HYDROXYZINE HCL 25 MG PO TABS
25.0000 mg | ORAL_TABLET | Freq: Three times a day (TID) | ORAL | Status: DC | PRN
Start: 2014-11-30 — End: 2014-12-01
  Administered 2014-11-30: 25 mg via ORAL
  Filled 2014-11-30: qty 1

## 2014-11-30 MED ORDER — LIDOCAINE-EPINEPHRINE 2 %-1:100000 IJ SOLN
20.0000 mL | Freq: Once | INTRAMUSCULAR | Status: DC
  Filled 2014-11-30: qty 20

## 2014-11-30 MED ORDER — ENOXAPARIN SODIUM 40 MG/0.4ML ~~LOC~~ SOLN
40.0000 mg | SUBCUTANEOUS | Status: DC
  Administered 2014-11-30: 40 mg via SUBCUTANEOUS
  Filled 2014-11-30: qty 0.4

## 2014-11-30 NOTE — H&P (Signed)
PCP: [name], MD    Chief Complaint:  Chest pain  HPI: Rhonda Mitchell is a 53 y.o. female   has a past medical history of COPD (chronic obstructive pulmonary disease); Anxiety; Shortness of breath; Arthritis; GERD (gastroesophageal reflux disease); Full dentures; and Insomnia.   Presented with  Patient reports having chest pain for over 1 month. She has been having worsening shortness of breath she has hx of COPD and continues to smoke. She reports constant chest pain sometimes worse with deep breaths sometimes positional. Patient for the past 1 year having been having ulcerations all over her body. She reports recently having fevers, diarrhea, night sweats. For the past 3 years she has had worsening hoarseness. She has seen ENT in the past for this had no follow up. In ER patietn was noted to have elevated d.dimer and tachycardia. CT angio of the chest was ordered but was unable to be obtained due to difficulties with IV acsess. She reports remote hx of IV drug use.  Patient have been reporting swelling on the inside of her nose with pus drainage and on her left buttocks.  Hospitalist was called for admission for atypical chest pain  Review of Systems:    Pertinent positives include: shortness of breath at rest. Fevers, chills, weight loss lesions.  Constitutional:  No weight loss, night sweats, fatigue, HEENT:  No headaches, Difficulty swallowing,Tooth/dental problems,Sore throat,  No sneezing, itching, ear ache, nasal congestion, post nasal drip,  Cardio-vascular:  No chest pain, Orthopnea, PND, anasarca, dizziness, palpitations.no Bilateral lower extremity swelling  GI:  No heartburn, indigestion, abdominal pain, nausea, vomiting, diarrhea, change in bowel habits, loss of appetite, melena, blood in stool, hematemesis Resp:  no  No dyspnea on exertion, No excess mucus, no productive cough, No non-productive cough, No coughing up of blood.No change in color of mucus.No  wheezing. Skin:  no rash or  No jaundice GU:  no dysuria, change in color of urine, no urgency or frequency. No straining to urinate.  No flank pain.  Musculoskeletal:  No joint pain or no joint swelling. No decreased range of motion. No back pain.  Psych:  No change in mood or affect. No depression or anxiety. No memory loss.  Neuro: no localizing neurological complaints, no tingling, no weakness, no double vision, no gait abnormality, no slurred speech, no confusion  Otherwise ROS are negative except for above, 10 systems were reviewed  Past Medical History: Past Medical History  Diagnosis Date  . COPD (chronic obstructive pulmonary disease)   . Anxiety   . Shortness of breath   . Arthritis   . GERD (gastroesophageal reflux disease)   . Full dentures   . Insomnia    Past Surgical History  Procedure Laterality Date  . Arm surgery    . Hand surgery  1990    both rt/lt carpal tunnels  . Shoulder surgery      right and left-age [age]  . Orif ulnar / radial shaft fracture  1990    right-got infection-had total 10 surgeies 1990  . Orif ulnar fracture Left 05/23/2014    Procedure: OPEN REDUCTION INTERNAL FIXATION (ORIF) LEFT ULNAR FRACTURE;  Surgeon: Harvie Junior, MD;  Location: [facility];  Service: Orthopedics;  Laterality: Left;     Medications: Prior to Admission medications   Medication Sig Start Date End Date Taking? Authorizing Provider  albuterol-ipratropium (COMBIVENT) 18-103 MCG/ACT inhaler Inhale 2 puffs into the lungs every 6 (six) hours as needed for wheezing.  Yes Historical Provider, MD  budesonide-formoterol (SYMBICORT) 80-4.5 MCG/ACT inhaler Inhale 2 puffs into the lungs 2 (two) times daily.   Yes Historical Provider, MD  ibuprofen (ADVIL,MOTRIN) 200 MG tablet Take 400 mg by mouth every 6 (six) hours as needed for mild pain.   Yes Historical Provider, MD  tiotropium (SPIRIVA) 18 MCG inhalation capsule Place 18 mcg into inhaler and inhale daily.    Yes Historical Provider, MD  b complex vitamins tablet Take 1 tablet by mouth daily.    Historical Provider, MD  diphenhydramine-acetaminophen (TYLENOL PM) 25-500 MG TABS Take 1 tablet by mouth at bedtime as needed.    Historical Provider, MD  DULoxetine (CYMBALTA) 60 MG capsule Take 60 mg by mouth daily.    Historical Provider, MD  loratadine (CLARITIN) 10 MG tablet Take 10 mg by mouth daily.    Historical Provider, MD  magnesium citrate SOLN Take 1 Bottle by mouth once.    Historical Provider, MD  oxyCODONE-acetaminophen (PERCOCET) 5-325 MG per tablet Take 1-2 tablets by mouth every 4 (four) hours as needed. Patient not taking: Reported on 11/30/2014 04/23/14   Geoffery Lyons, MD  oxyCODONE-acetaminophen (PERCOCET/ROXICET) 5-325 MG per tablet Take 1-2 tablets by mouth every 6 (six) hours as needed for severe pain. Patient not taking: Reported on 11/30/2014 05/23/14   Matthew Folks, PA-C    Allergies:  No Known Allergies  Social History:  Ambulatory  independently   Lives at home  With family   reports that she has been smoking Cigarettes.  She has been smoking about 1.00 pack per day. She has never used smokeless tobacco. She reports that she drinks about 1.8 oz of alcohol per week. She reports that she does not use illicit drugs.    Family History: family history includes Breast cancer in her mother.    Physical Exam: Patient Vitals for the past 24 hrs:  BP Temp Temp src Pulse Resp SpO2  11/30/14 2015 120/68 mmHg - - - 19 -  11/30/14 1730 129/93 mmHg - - 72 16 97 %  11/30/14 1645 154/99 mmHg - - 83 23 99 %  11/30/14 1637 134/75 mmHg - - - - -  11/30/14 1630 - - - 69 21 94 %  11/30/14 1615 114/94 mmHg - - 78 21 95 %  11/30/14 1600 152/83 mmHg - - 70 17 94 %  11/30/14 1531 - 98.7 F (37.1 C) Oral - - -  11/30/14 1430 (!) 145/121 mmHg - - (!) 123 (!) 31 99 %    1. General:  in No Acute distress 2. Psychological: Alert and  Oriented 3. Head/ENT:   Dry Mucous Membranes                           Head Non traumatic, neck supple                           Poor Dentition 4. SKIN:  decreased Skin turgor,  Skin clean Dry, multiple excoriations, small abscess of the left butock, likely abcsess of the nasal wing on the right.  5. Heart: Regular rate and rhythm no Murmur, Rub or gallop 6. Lungs: no wheezes or crackles   7. Abdomen: Soft, non-tender, Non distended 8. Lower extremities: no clubbing, cyanosis, or edema 9. Neurologically Grossly intact, moving all 4 extremities equally 10. MSK: Normal range of motion  body mass index is unknown because there is no weight  on file.   Labs on Admission:   Results for orders placed or performed during the hospital encounter of 11/30/14 (from the past 24 hour(s))  CBC     Status: Abnormal   Collection Time: 11/30/14  2:01 PM  Result Value Ref Range   WBC 5.6 4.0 - 10.5 K/uL   RBC 3.33 (L) 3.87 - 5.11 MIL/uL   Hemoglobin 12.1 12.0 - 15.0 g/dL   HCT 40.936.8 81.136.0 - 91.446.0 %   MCV 110.5 (H) 78.0 - 100.0 fL   MCH 36.3 (H) 26.0 - 34.0 pg   MCHC 32.9 30.0 - 36.0 g/dL   RDW 78.214.3 95.611.5 - 21.315.5 %   Platelets 246 150 - 400 K/uL  Basic metabolic panel     Status: Abnormal   Collection Time: 11/30/14  2:01 PM  Result Value Ref Range   Sodium 139 137 - 147 mEq/L   Potassium 3.9 3.7 - 5.3 mEq/L   Chloride 95 (L) 96 - 112 mEq/L   CO2 24 19 - 32 mEq/L   Glucose, Bld 113 (H) 70 - 99 mg/dL   BUN 5 (L) 6 - 23 mg/dL   Creatinine, Ser 0.860.54 0.50 - 1.10 mg/dL   Calcium 8.6 8.4 - 57.810.5 mg/dL   GFR calc non Af Amer >90 >90 mL/min   GFR calc Af Amer >90 >90 mL/min   Anion gap 20 (H) 5 - 15  [facility] trop[facility])     Status: None   Collection Time: 11/30/14  2:05 PM  Result Value Ref Range   Troponin i, poc 0.03 0.00 - 0.08 ng/mL   Comment 3          D-dimer, quantitative     Status: Abnormal   Collection Time: 11/30/14  4:11 PM  Result Value Ref Range   D-Dimer, Quant 3.10 (H) 0.00 - 0.48 ug/mL-FEU    UA not obtained  No  results found for: HGBA1C  CrCl cannot be calculated (Unknown ideal weight.).  BNP (last 3 results) No results for input(s): PROBNP in the last 8760 hours.  Other results:  I have pearsonaly reviewed this: ECG REPORT  Rate:73  Rhythm: nSR ST&T Change: no sichemia   There were no vitals filed for this visit.   Cultures: No results found for: SDES, SPECREQUEST, CULT, REPTSTATUS   Radiological Exams on Admission: Dg Chest 2 View  11/30/2014   CLINICAL DATA:  One month history of right-sided chest pain, shortness of breath, numbness and tingling. Three week history of cough and fever.  EXAM: CHEST  2 VIEW  COMPARISON:  Two-view chest x-ray 07/11/2014, 01/06/2014, 05/17/2010. CT chest 04/23/2014.  FINDINGS: Cardiac silhouette normal in size, unchanged. Thoracic aorta atherosclerotic, unchanged. Hilar and mediastinal contours otherwise unremarkable. Mildly prominent bronchovascular markings diffusely and mild central peribronchial thickening, more so than on the most recent prior examination. Lungs otherwise clear. No localized airspace consolidation. No pleural effusions. No pneumothorax. Normal pulmonary vascularity. Healed fractures involving the left anterolateral 4th, 5th and sixth ribs again noted.  IMPRESSION: Stable mild changes of chronic bronchitis and/or asthma. No acute cardiopulmonary disease.   Electronically Signed   By: Hulan Saashomas  Lawrence M.D.   On: 11/30/2014 15:16    Chart has been reviewed  Assessment/Plan  53 year old female with past history of drug abuse presents with chronic chest pain, tachycardia, evidence of dehydration, no mass excoriations with abscess of the left buttock and right side of the nose.   Present on Admission:  . Chest pain -  chronic, given some pleuritic component and evidence of tachycardia shortness of breath we'll attempt CT angiogram in the morning once able to obtain better access meanwhile will cycle cardiac enzymes repeat serial EKG and  obtain echo  . Tachycardia patient appears to be dehydrated we'll give IV fluids check orthostatics  . COPD (chronic obstructive pulmonary disease)  - albuterol as needed scheduled Atrovent currently does not appear to be in acute exacerbation  . Cellulitis and abscess - Regarding abscess of a nose ENT consult in a.m. ER will assist with drainage of abscess of the buttocks, cover with IV antibiotics for now sent for cultures given numerous abscess we'll check for HIV status    Prophylaxis:  Lovenox, Protonix  CODE STATUS:  FULL CODE   Other plan as per orders.  I have spent a total of 55 min on this admission  Naomie Crow 11/30/2014, 8:39 PM  [facility]  Pager [phone]   after 2 AM please page floor coverage PA If 7AM-7PM, please contact the day team taking care of the patient  [email or web address]  Password [email or web address]

## 2014-11-30 NOTE — ED Notes (Signed)
PA at bedside.

## 2014-11-30 NOTE — ED Provider Notes (Signed)
Patient [facility] in the Emergency Department [facility] Patient reports chest pain for one month Exam : awake/alert, very anxious and has chest wall tenderness Plan: will need repeat EKG as initial EKG with significant artifact    Joya Gaskinsonald W Mandi Mattioli, MD 11/30/14 1538

## 2014-11-30 NOTE — ED Notes (Signed)
Pt continues to finish first bolus will start second when finished.

## 2014-11-30 NOTE — Progress Notes (Signed)
ANTIBIOTIC CONSULT NOTE - INITIAL  Pharmacy Consult for vancomycin Indication: buttock wound infection  No Known Allergies  Patient Measurements: height 69 inches, weight 45 kg (per pt)    Vital Signs: Temp: 98.7 F (37.1 C) (12/11 1531) Temp Source: Oral (12/11 1531) BP: 120/68 mmHg (12/11 2015) Pulse Rate: 72 (12/11 1730) Intake/Output from previous day:   Intake/Output from this shift:    Labs:  Recent Labs  11/30/14 1401  WBC 5.6  HGB 12.1  PLT 246  CREATININE 0.54   CrCl cannot be calculated (Unknown ideal weight.). No results for input(s): VANCOTROUGH, VANCOPEAK, VANCORANDOM, GENTTROUGH, GENTPEAK, GENTRANDOM, TOBRATROUGH, TOBRAPEAK, TOBRARND, AMIKACINPEAK, AMIKACINTROU, AMIKACIN in the last 72 hours.   Microbiology: No results found for this or any previous visit (from the past 720 hour(s)).  Medical History: Past Medical History  Diagnosis Date  . COPD (chronic obstructive pulmonary disease)   . Anxiety   . Shortness of breath   . Arthritis   . GERD (gastroesophageal reflux disease)   . Full dentures   . Insomnia     Assessment: Patient is a 53 y.o. F with nose and buttocks abscess-- s/p I&D of buttock abscess in the ED.  To start vancomycin for wound infection.  Goal of Therapy:  Vancomycin trough level 10-15 mcg/ml  Plan:  1) vancomycin 750mg  Iv q12h  Chapin Arduini P 11/30/2014,9:11 PM

## 2014-11-30 NOTE — ED Notes (Signed)
Pt from comes from home with complaints of CP x1 month to the r side denies any radiation.  Pt states in tub and pain got worst 9/10. Vitals with EMS 98%  HR 80 . EMS states unable to get BP because pt tightens arm. Pt received 324 of asprin.

## 2014-11-30 NOTE — ED Provider Notes (Addendum)
Angiocath insertion Performed by: Dagmar HaitWALDEN, WILLIAM Garcia Dalzell  Consent: Verbal consent obtained. Risks and benefits: risks, benefits and alternatives were discussed Time out: Immediately prior to procedure a "time out" was called to verify the correct patient, procedure, equipment, support staff and site/side marked as required.  Preparation: Patient was prepped and draped in the usual sterile fashion.  Vein Location: L AC  Yes Ultrasound Guided  Gauge: 20, short  Normal blood return and flush without difficulty Patient tolerance: Patient tolerated the procedure well with no immediate complications.   IV placed for CT Angio - PE study.   1st IV blew, [facility]  Angiocath insertion Performed by: Dagmar HaitWALDEN, WILLIAM Sabir Charters  Consent: Verbal consent obtained. Risks and benefits: risks, benefits and alternatives were discussed Time out: Immediately prior to procedure a "time out" was called to verify the correct patient, procedure, equipment, support staff and site/side marked as required.  Preparation: Patient was prepped and draped in the usual sterile fashion.  Vein Location: R AC  Yes Ultrasound Guided  Gauge: 20, long  Normal blood return and flush without difficulty Patient tolerance: Patient tolerated the procedure well with no immediate complications.     Elwin MochaBlair Allani Reber, MD 12/01/14 [phone]

## 2014-11-30 NOTE — ED Provider Notes (Signed)
CSN: [id number]     Arrival date & time 11/30/14  1349 History   First MD Initiated Contact with Patient 11/30/14 1406     Chief Complaint  Patient presents with  . Chest Pain     (Consider location/radiation/quality/duration/timing/severity/associated sxs/prior Treatment) HPI Comments: Patient presents to the emergency department with a history of COPD, shortness breath, and GERD with chief complaint of chest pain 1 month. She states that the pain has been constant for the past month. She states that the severity does fluctuate. Patient complains of pain that worsened today while she was getting in the tub. She states the pain was 9 out of 10. She denies any radiating symptoms. She states that she always feels out of breath because of her COPD.  Denies any cardiac history. Denies any history of PE or DVT.  The history is provided by the patient. No language interpreter was used.    Past Medical History  Diagnosis Date  . COPD (chronic obstructive pulmonary disease)   . Anxiety   . Shortness of breath   . Arthritis   . GERD (gastroesophageal reflux disease)   . Full dentures   . Insomnia    Past Surgical History  Procedure Laterality Date  . Arm surgery    . Hand surgery  1990    both rt/lt carpal tunnels  . Shoulder surgery      right and left-age [age]  . Orif ulnar / radial shaft fracture  1990    right-got infection-had total 10 surgeies 1990  . Orif ulnar fracture Left 05/23/2014    Procedure: OPEN REDUCTION INTERNAL FIXATION (ORIF) LEFT ULNAR FRACTURE;  Surgeon: Harvie JuniorJohn L Graves, MD;  Location: [facility];  Service: Orthopedics;  Laterality: Left;   No family history on file. History  Substance Use Topics  . Smoking status: Current Every Day Smoker -- 1.00 packs/day    Types: Cigarettes  . Smokeless tobacco: Never Used  . Alcohol Use: 1.8 oz/week    3 Glasses of wine per week   OB History    No data available     Review of Systems  Constitutional:  Negative for fever and chills.  Respiratory: Negative for shortness of breath.   Cardiovascular: Positive for chest pain.  Gastrointestinal: Negative for nausea, vomiting, diarrhea and constipation.  Genitourinary: Negative for dysuria.      Allergies  Review of patient's allergies indicates no known allergies.  Home Medications   Prior to Admission medications   Medication Sig Start Date End Date Taking? Authorizing Provider  albuterol-ipratropium (COMBIVENT) 18-103 MCG/ACT inhaler Inhale 2 puffs into the lungs every 6 (six) hours as needed for wheezing.   Yes Historical Provider, MD  budesonide-formoterol (SYMBICORT) 80-4.5 MCG/ACT inhaler Inhale 2 puffs into the lungs 2 (two) times daily.   Yes Historical Provider, MD  ibuprofen (ADVIL,MOTRIN) 200 MG tablet Take 400 mg by mouth every 6 (six) hours as needed for mild pain.   Yes Historical Provider, MD  tiotropium (SPIRIVA) 18 MCG inhalation capsule Place 18 mcg into inhaler and inhale daily.   Yes Historical Provider, MD  b complex vitamins tablet Take 1 tablet by mouth daily.    Historical Provider, MD  diphenhydramine-acetaminophen (TYLENOL PM) 25-500 MG TABS Take 1 tablet by mouth at bedtime as needed.    Historical Provider, MD  DULoxetine (CYMBALTA) 60 MG capsule Take 60 mg by mouth daily.    Historical Provider, MD  loratadine (CLARITIN) 10 MG tablet Take 10 mg by mouth  daily.    Historical Provider, MD  magnesium citrate SOLN Take 1 Bottle by mouth once.    Historical Provider, MD  oxyCODONE-acetaminophen (PERCOCET) 5-325 MG per tablet Take 1-2 tablets by mouth every 4 (four) hours as needed. Patient not taking: Reported on 11/30/2014 04/23/14   Geoffery Lyons, MD  oxyCODONE-acetaminophen (PERCOCET/ROXICET) 5-325 MG per tablet Take 1-2 tablets by mouth every 6 (six) hours as needed for severe pain. Patient not taking: Reported on 11/30/2014 05/23/14   Matthew Folks, PA-C   There were no vitals taken for this visit. Physical Exam   Constitutional: She is oriented to person, place, and time. She appears well-developed and well-nourished.  Appears older than stated age, slightly tremulous   HENT:  Head: Normocephalic and atraumatic.  Eyes: Conjunctivae and EOM are normal. Pupils are equal, round, and reactive to light.  Neck: Normal range of motion. Neck supple.  Cardiovascular: Normal rate and regular rhythm.  Exam reveals no gallop and no friction rub.   No murmur heard. Normal rate on my exam  Pulmonary/Chest: Effort normal and breath sounds normal. No respiratory distress. She has no wheezes. She has no rales. She exhibits no tenderness.  Abdominal: Soft. She exhibits no distension and no mass. There is no tenderness. There is no rebound and no guarding.  Musculoskeletal: Normal range of motion. She exhibits no edema or tenderness.  Neurological: She is alert and oriented to person, place, and time.  Skin: Skin is warm and dry.  Scattered sores on face and extremities, no abscess or cellulitis.  Psychiatric: She has a normal mood and affect. Her behavior is normal. Judgment and thought content normal.  Nursing note and vitals reviewed.   ED Course  Procedures (including critical care time) Results for orders placed or performed during the hospital encounter of 11/30/14  CBC  Result Value Ref Range   WBC 5.6 4.0 - 10.5 K/uL   RBC 3.33 (L) 3.87 - 5.11 MIL/uL   Hemoglobin 12.1 12.0 - 15.0 g/dL   HCT 16.1 09.6 - 04.5 %   MCV 110.5 (H) 78.0 - 100.0 fL   MCH 36.3 (H) 26.0 - 34.0 pg   MCHC 32.9 30.0 - 36.0 g/dL   RDW 40.9 81.1 - 91.4 %   Platelets 246 150 - 400 K/uL  Basic metabolic panel  Result Value Ref Range   Sodium 139 137 - 147 mEq/L   Potassium 3.9 3.7 - 5.3 mEq/L   Chloride 95 (L) 96 - 112 mEq/L   CO2 24 19 - 32 mEq/L   Glucose, Bld 113 (H) 70 - 99 mg/dL   BUN 5 (L) 6 - 23 mg/dL   Creatinine, Ser 7.82 0.50 - 1.10 mg/dL   Calcium 8.6 8.4 - 95.6 mg/dL   GFR calc non Af Amer >90 >90 mL/min   GFR  calc Af Amer >90 >90 mL/min   Anion gap 20 (H) 5 - 15  D-dimer, quantitative  Result Value Ref Range   D-Dimer, Quant 3.10 (H) 0.00 - 0.48 ug/mL-FEU  I-stat troponin, ED (not at [facility])  Result Value Ref Range   Troponin i, poc 0.03 0.00 - 0.08 ng/mL   Comment 3           Dg Chest 2 View  11/30/2014   CLINICAL DATA:  One month history of right-sided chest pain, shortness of breath, numbness and tingling. Three week history of cough and fever.  EXAM: CHEST  2 VIEW  COMPARISON:  Two-view chest x-ray 07/11/2014, 01/06/2014,  05/17/2010. CT chest 04/23/2014.  FINDINGS: Cardiac silhouette normal in size, unchanged. Thoracic aorta atherosclerotic, unchanged. Hilar and mediastinal contours otherwise unremarkable. Mildly prominent bronchovascular markings diffusely and mild central peribronchial thickening, more so than on the most recent prior examination. Lungs otherwise clear. No localized airspace consolidation. No pleural effusions. No pneumothorax. Normal pulmonary vascularity. Healed fractures involving the left anterolateral 4th, 5th and sixth ribs again noted.  IMPRESSION: Stable mild changes of chronic bronchitis and/or asthma. No acute cardiopulmonary disease.   Electronically Signed   By: Hulan Saashomas  Lawrence M.D.   On: 11/30/2014 15:16     Imaging Review No results found.   EKG Interpretation   Date/Time:  Friday November 30 2014 13:55:57 EST Ventricular Rate:  90 PR Interval:  139 QRS Duration: 90 QT Interval:  414 QTC Calculation: 507 R Axis:   149 Text Interpretation:  Sinus rhythm Anterior infarct, age indeterminate  Artifact Confirmed by Bebe ShaggyWICKLINE  MD, DONALD ([id number]) on 11/30/2014 2:01:33  PM      MDM   Final diagnoses:  SOB (shortness of breath)  Atypical chest pain    Patient with chest pain 1 month. She states that the pain worsened today while getting into the bathtub. She has known COPD, and states that she always feel short of breath because of this. Cardiac risk  factors include age and smoking history. No history of PE or DVT.  Filed Vitals:   11/30/14 1600  BP: 152/83  Pulse: 70  Temp:   Resp: 17    Patient seen by and discussed with Dr. Bebe ShaggyWickline.  4:51 PM D-dimer elevated to >3.  Will order CT angio.  Patient told to plan for admission.  [facility] signed [facility] to [facility], PA-C.   Roxy Horsemanobert Merlen Gurry, PA-C 11/30/14 1701  Roxy Horsemanobert Isaack Preble, PA-C 12/03/14 1515  Joya Gaskinsonald W Wickline, MD 12/03/14 [phone]

## 2014-11-30 NOTE — ED Notes (Signed)
Transporting patient to new room assignment. 

## 2014-11-30 NOTE — ED Provider Notes (Signed)
Care assumed from Rhonda Horsemanobert Browning, PA-C at shift change. Pt with chest pain, constant for the past month, worsening today. Cardiac workup with non-specific T wave changes on EKG, no old to compare, otherwise negative. HEART score 4. Tachycardic on arrival. D-dimer positive. CT angio pending to evaluate for PE. Pain controlled with morphine. Plan to admit. 8:25 PM Angio-cath placement attempted multiple times, however the catheter does not remain in place, pt most likely pulling it out. Pt will be admitted for VQ scan. Admission accepted by Dr. Adela Glimpseoutova, [facility]. Abscess to left buttock area noticed by Dr. Adela Glimpseoutova. Pt states she gets these frequently. I&D requested.   INCISION AND DRAINAGE Performed by: Rhonda Mitchell, Rhonda Mitchell Consent: Verbal consent obtained. Risks and benefits: risks, benefits and alternatives were discussed Type: abscess  Body area: left buttock area  Anesthesia: local infiltration  Incision was made with a scalpel.  Local anesthetic: lidocaine 1% without epinephrine  Anesthetic total: 2 ml  Complexity: complex Blunt dissection to break up loculations  Drainage: purulent  Drainage amount: small  Packing material: none  Patient tolerance: Patient tolerated the procedure well with no immediate complications.    Discussed with attending Dr. Gwendolyn GrantWalden who also evaluated patient and agrees with plan of care.   Rhonda SpeedRobyn M Abimelec Grochowski, PA-C 11/30/14 2127  Rhonda Speedobyn M Hanifah Royse, PA-C 11/30/14 2127  Rhonda MochaBlair Walden, MD 12/01/14 [phone]

## 2014-11-30 NOTE — ED Notes (Signed)
Spoke with Phlebotomy and they stated they are on the way for LAB.

## 2014-12-01 ENCOUNTER — Inpatient Hospital Stay (HOSPITAL_COMMUNITY): Payer: Self-pay

## 2014-12-01 DIAGNOSIS — R000 Tachycardia, unspecified: Secondary | ICD-10-CM

## 2014-12-01 DIAGNOSIS — R079 Chest pain, unspecified: Principal | ICD-10-CM

## 2014-12-01 LAB — TROPONIN I: Troponin I: <0.3 ng/mL (ref <0.30)

## 2014-12-01 LAB — URINALYSIS, ROUTINE W REFLEX MICROSCOPIC
Bilirubin Urine: NEGATIVE
Glucose, UA: NEGATIVE mg/dL
Hgb urine dipstick: NEGATIVE
KETONES UR: NEGATIVE mg/dL
Leukocytes, UA: NEGATIVE
NITRITE: NEGATIVE
Protein, ur: NEGATIVE mg/dL
Specific Gravity, Urine: >1.046 — ABNORMAL HIGH (ref 1.005–1.030)
UROBILINOGEN UA: 0.2 mg/dL (ref 0.0–1.0)
pH: 6.5 (ref 5.0–8.0)

## 2014-12-01 LAB — CBC
HEMATOCRIT: 32.6 % — AB (ref 36.0–46.0)
Hemoglobin: 10.8 g/dL — ABNORMAL LOW (ref 12.0–15.0)
MCH: 36.6 pg — AB (ref 26.0–34.0)
MCHC: 33.1 g/dL (ref 30.0–36.0)
MCV: 110.5 fL — AB (ref 78.0–100.0)
Platelets: 219 10*3/uL (ref 150–400)
RBC: 2.95 MIL/uL — AB (ref 3.87–5.11)
RDW: 14.6 % (ref 11.5–15.5)
WBC: 4.4 10*3/uL (ref 4.0–10.5)

## 2014-12-01 LAB — COMPREHENSIVE METABOLIC PANEL
ALBUMIN: 2.7 g/dL — AB (ref 3.5–5.2)
ALT: 16 U/L (ref 0–35)
AST: 36 U/L (ref 0–37)
Alkaline Phosphatase: 106 U/L (ref 39–117)
Anion gap: 17 — ABNORMAL HIGH (ref 5–15)
BILIRUBIN TOTAL: 0.5 mg/dL (ref 0.3–1.2)
BUN: 13 mg/dL (ref 6–23)
CHLORIDE: 100 meq/L (ref 96–112)
CO2: 25 mEq/L (ref 19–32)
CREATININE: 0.56 mg/dL (ref 0.50–1.10)
Calcium: 8.4 mg/dL (ref 8.4–10.5)
GFR calc Af Amer: >90 mL/min (ref >90)
GFR calc non Af Amer: >90 mL/min (ref >90)
Glucose, Bld: 109 mg/dL — ABNORMAL HIGH (ref 70–99)
Potassium: 3.6 mEq/L — ABNORMAL LOW (ref 3.7–5.3)
Sodium: 142 mEq/L (ref 137–147)
Total Protein: 7.3 g/dL (ref 6.0–8.3)

## 2014-12-01 LAB — PREALBUMIN: Prealbumin: 18.9 mg/dL (ref 17.0–34.0)

## 2014-12-01 LAB — PHOSPHORUS: PHOSPHORUS: 4.5 mg/dL (ref 2.3–4.6)

## 2014-12-01 LAB — TSH: TSH: 7.17 u[IU]/mL — AB (ref 0.350–4.500)

## 2014-12-01 LAB — RAPID URINE DRUG SCREEN, HOSP PERFORMED
AMPHETAMINES: NOT DETECTED
BARBITURATES: NOT DETECTED
BENZODIAZEPINES: NOT DETECTED
Cocaine: NOT DETECTED
Opiates: POSITIVE — AB
TETRAHYDROCANNABINOL: NOT DETECTED

## 2014-12-01 LAB — HEMOGLOBIN A1C
Hgb A1c MFr Bld: 5.7 % — ABNORMAL HIGH (ref <5.7)
Mean Plasma Glucose: 117 mg/dL — ABNORMAL HIGH (ref <117)

## 2014-12-01 LAB — MAGNESIUM: Magnesium: 1.3 mg/dL — ABNORMAL LOW (ref 1.5–2.5)

## 2014-12-01 LAB — HIV ANTIBODY (ROUTINE TESTING W REFLEX): HIV: NONREACTIVE

## 2014-12-01 MED ORDER — MAGNESIUM SULFATE 2 GM/50ML IV SOLN
2.0000 g | Freq: Once | INTRAVENOUS | Status: AC
  Administered 2014-12-01: 2 g via INTRAVENOUS
  Filled 2014-12-01: qty 50

## 2014-12-01 MED ORDER — LORAZEPAM 1 MG PO TABS
1.0000 mg | ORAL_TABLET | Freq: Once | ORAL | Status: AC
  Administered 2014-12-01: 1 mg via ORAL
  Filled 2014-12-01: qty 1

## 2014-12-01 MED ORDER — IOHEXOL 350 MG/ML SOLN
100.0000 mL | Freq: Once | INTRAVENOUS | Status: AC | PRN
  Administered 2014-12-01: 100 mL via INTRAVENOUS

## 2014-12-01 MED ORDER — SULFAMETHOXAZOLE-TRIMETHOPRIM 800-160 MG PO TABS
1.0000 | ORAL_TABLET | Freq: Two times a day (BID) | ORAL | Status: DC
  Filled 2014-12-01 (×2): qty 1

## 2014-12-01 MED ORDER — DOXYCYCLINE HYCLATE 100 MG IV SOLR: 100.0000 mg | Freq: Two times a day (BID) | INTRAVENOUS | Status: DC

## 2014-12-01 MED ORDER — SULFAMETHOXAZOLE-TRIMETHOPRIM 800-160 MG PO TABS: 1.0000 | ORAL_TABLET | Freq: Two times a day (BID) | ORAL | Status: DC

## 2014-12-01 NOTE — Discharge Summary (Addendum)
Physician Discharge Summary  Rhonda Mitchell [name] DOB: [birth year] DOA: 11/30/2014       Left AMA PCP: [name], [name], MD  Admit date: 11/30/2014 Discharge date: 12/01/2014  Time spent: 35 minutes  Recommendations for Outpatient Follow-up:  1. Follow-up with PCP next week  Discharge Diagnoses:  Active Problems:   Chest pain   Tachycardia   COPD (chronic obstructive pulmonary disease)   Cellulitis and abscess   Discharge Condition: Stable  Diet recommendation: Heart healthy  Filed Weights   11/30/14 2226 12/01/14 0406  Weight: 46.131 kg (101 lb 11.2 oz) 46.04 kg (101 lb 8 oz)    History of present illness:  53 y.o. female   has a past medical history of COPD (chronic obstructive pulmonary disease); Anxiety; Shortness of breath; Arthritis; GERD (gastroesophageal reflux disease); Full dentures; and Insomnia.   Presented with  Patient reports having chest pain for over 1 month. She has been having worsening shortness of breath she has hx of COPD and continues to smoke. She reports constant chest pain sometimes worse with deep breaths sometimes positional. Patient for the past 1 year having been having ulcerations all over her body. She reports recently having fevers, diarrhea, night sweats. For the past 3 years she has had worsening hoarseness. She has seen ENT in the past for this had no follow up. In ER patietn was noted to have elevated d.dimer and tachycardia. CT angio of the chest was ordered but was unable to be obtained due to difficulties with IV acsess. She reports remote hx of IV drug use.   Hospital Course:  Chest pain - CT Angelica the chest is negative for PE. - Cardiac enzymes are negative 3, EKG shows normal sinus rhythm no T wave inversions.  Tachycardia: - Resolved. - UDS was positive for opiates, alcohol level was less than 11.   Cellulitis and abscess - started on vanc and rocephin de-escalate to doxy. - She relates she wants to leave today  and she will follow-up with her primary care doctor, so I will change her antibiotics of Bactrim orally.  - I have explained the risk and benefits to her she understands and she was satting like to go home. - It seems like she's crashing herself as she has multiple scratch over her body question of disc including to her multiple abscesses. HIV is negative.  Hypomagnesemia/Hypokalemia: - repleted, recheck.  EtOH abuse: Started on thiamine and folate check an alcohol level and a UDS. - She does have a history of IV drug abuse.  Procedures:  Ct angio as below  Consultations:  none  Discharge Exam: Filed Vitals:   12/01/14 0908  BP:   Pulse: 74  Temp:   Resp: 18    General: See progress note  Discharge Instructions You were cared for by a hospitalist during your hospital stay. If you have any questions about your discharge medications or the care you received while you were in the hospital after you are discharged, you can call the unit and asked to speak with the hospitalist on call if the hospitalist that took care of you is not available. Once you are discharged, your primary care physician will handle any further medical issues. Please note that NO REFILLS for any discharge medications will be authorized once you are discharged, as it is imperative that you return to your primary care physician (or establish a relationship with a primary care physician if you do not have one) for your aftercare needs so that they can  reassess your need for medications and monitor your lab values.   Current Discharge Medication List    START taking these medications   Details  sulfamethoxazole-trimethoprim (BACTRIM DS,SEPTRA DS) 800-160 MG per tablet Take 1 tablet by mouth every 12 (twelve) hours. Qty: 20 tablet, Refills: 0      CONTINUE these medications which have NOT CHANGED   Details  albuterol-ipratropium (COMBIVENT) 18-103 MCG/ACT inhaler Inhale 2 puffs into the lungs every 6 (six)  hours as needed for wheezing.    budesonide-formoterol (SYMBICORT) 80-4.5 MCG/ACT inhaler Inhale 2 puffs into the lungs 2 (two) times daily.    ibuprofen (ADVIL,MOTRIN) 200 MG tablet Take 400 mg by mouth every 6 (six) hours as needed for mild pain.    tiotropium (SPIRIVA) 18 MCG inhalation capsule Place 18 mcg into inhaler and inhale daily.    b complex vitamins tablet Take 1 tablet by mouth daily.    diphenhydramine-acetaminophen (TYLENOL PM) 25-500 MG TABS Take 1 tablet by mouth at bedtime as needed.    DULoxetine (CYMBALTA) 60 MG capsule Take 60 mg by mouth daily.    loratadine (CLARITIN) 10 MG tablet Take 10 mg by mouth daily.    magnesium citrate SOLN Take 1 Bottle by mouth once.    !! oxyCODONE-acetaminophen (PERCOCET) 5-325 MG per tablet Take 1-2 tablets by mouth every 4 (four) hours as needed. Qty: 25 tablet, Refills: 0    !! oxyCODONE-acetaminophen (PERCOCET/ROXICET) 5-325 MG per tablet Take 1-2 tablets by mouth every 6 (six) hours as needed for severe pain. Qty: 40 tablet, Refills: 0     !! - Potential duplicate medications found. Please discuss with provider.     No Known Allergies    The results of significant diagnostics from this hospitalization (including imaging, microbiology, ancillary and laboratory) are listed below for reference.    Significant Diagnostic Studies: Dg Chest 2 View  11/30/2014   CLINICAL DATA:  One month history of right-sided chest pain, shortness of breath, numbness and tingling. Three week history of cough and fever.  EXAM: CHEST  2 VIEW  COMPARISON:  Two-view chest x-ray 07/11/2014, 01/06/2014, 05/17/2010. CT chest 04/23/2014.  FINDINGS: Cardiac silhouette normal in size, unchanged. Thoracic aorta atherosclerotic, unchanged. Hilar and mediastinal contours otherwise unremarkable. Mildly prominent bronchovascular markings diffusely and mild central peribronchial thickening, more so than on the most recent prior examination. Lungs otherwise  clear. No localized airspace consolidation. No pleural effusions. No pneumothorax. Normal pulmonary vascularity. Healed fractures involving the left anterolateral 4th, 5th and sixth ribs again noted.  IMPRESSION: Stable mild changes of chronic bronchitis and/or asthma. No acute cardiopulmonary disease.   Electronically Signed   By: Hulan Saas M.D.   On: 11/30/2014 15:16   Ct Angio Chest Pe W/cm &/or Wo Cm  12/01/2014   CLINICAL DATA:  Shortness of breath and pain in the right chest upon exertion for months.  EXAM: CT ANGIOGRAPHY CHEST WITH CONTRAST  TECHNIQUE: Multidetector CT imaging of the chest was performed using the standard protocol during bolus administration of intravenous contrast. Multiplanar CT image reconstructions and MIPs were obtained to evaluate the vascular anatomy.  CONTRAST:  OMNIPAQUE IOHEXOL 350 MG/ML SOLN  COMPARISON:  05/26/2009  FINDINGS: Technically adequate study with good opacification of the central and segmental pulmonary arteries. No focal filling defects demonstrated. No evidence for significant pulmonary embolus.  Normal heart size. Normal caliber thoracic aorta. No aortic dissection. Great vessel origins are patent. Esophagus is decompressed. No significant lymphadenopathy in the chest.  Emphysematous changes throughout  the lungs. No focal airspace disease or consolidation. Calcified granuloma in the left lung base. No pleural effusions. No pneumothorax. Multiple old bilateral rib fractures.  Included portions of the upper abdominal organs demonstrate heterogeneous fatty infiltration in the liver. Cysts in the left kidney.  Review of the MIP images confirms the above findings.  IMPRESSION: No evidence of significant pulmonary embolus. Emphysematous changes in the lungs. No evidence of active pulmonary disease. Heterogeneous fatty infiltration of the liver.   Electronically Signed   By: Burman NievesWilliam  Stevens M.D.   On: 12/01/2014 05:56    Microbiology: No results found  for this or any previous visit (from the past 240 hour(s)).   Labs: Basic Metabolic Panel:  Recent Labs Lab 11/30/14 1401 11/30/14 2129 11/30/14 2240 12/01/14 0401  NA 139 138  --  142  K 3.9 3.9  --  3.6*  CL 95* 97  --  100  CO2 24 25  --  25  GLUCOSE 113* 90  --  109*  BUN 5* 8  --  13  CREATININE 0.54 0.48* 0.53 0.56  CALCIUM 8.6 8.1*  --  8.4  MG  --   --  1.3* 1.3*  PHOS  --   --  3.1 4.5   Liver Function Tests:  Recent Labs Lab 11/30/14 2129 12/01/14 0401  AST 37 36  ALT 16 16  ALKPHOS 113 106  BILITOT 0.8 0.5  PROT 7.7 7.3  ALBUMIN 2.9* 2.7*   No results for input(s): LIPASE, AMYLASE in the last 168 hours. No results for input(s): AMMONIA in the last 168 hours. CBC:  Recent Labs Lab 11/30/14 1401 11/30/14 2240 12/01/14 0401  WBC 5.6 4.1 4.4  HGB 12.1 10.2* 10.8*  HCT 36.8 31.1* 32.6*  MCV 110.5* 110.3* 110.5*  PLT 246 208 219   Cardiac Enzymes:  Recent Labs Lab 11/30/14 2240 12/01/14 0401  TROPONINI <0.30 <0.30   BNP: BNP (last 3 results) No results for input(s): PROBNP in the last 8760 hours. CBG: No results for input(s): GLUCAP in the last 168 hours.     Signed:  Marinda ElkFELIZ ORTIZ, ABRAHAM  [facility] 12/01/2014, 9:27 AM

## 2014-12-01 NOTE — Progress Notes (Signed)
[facility] PROGRESS NOTE  Assessment/Plan: Chest pain - CT Angelica the chest is negative for PE. - Cardiac enzymes are negative 3, EKG shows normal sinus rhythm no T wave inversions.  Tachycardia: - Resolved  Cellulitis and abscess - started on vanc and rocephin de-escalate to doxy. - She relates she wants to leave today and she will follow-up with her primary care doctor, so I will change her antibiotics of Bactrim orally. - I have explained the risk and benefits to her she understands and she was satting like to go home.   Hypomagnesemia/Hypokalemia: - repleted, recheck.  EtOH abuse: Started on thiamine and folate check an alcohol level and a UDS. Code Status: full Family Communication: none  Disposition Plan: inpatient   Consultants:  none  Procedures:  CT angio  Antibiotics:  Vank and Rocephin started on the day of admission.  HPI/Subjective: Wants to home today despite her workup has not been done  Objective: Filed Vitals:   11/30/14 2154 11/30/14 2226 12/01/14 0406 12/01/14 0908  BP: 141/114 118/90    Pulse: 82 72  74  Temp:  98.5 F (36.9 C) 98.2 F (36.8 C)   TempSrc:  Oral Oral   Resp: 16   18  Height:  5' (1.524 m)    Weight:  46.131 kg (101 lb 11.2 oz) 46.04 kg (101 lb 8 oz)   SpO2: 95% 94% 98% 100%   No intake or output data in the 24 hours ending 12/01/14 0912 Filed Weights   11/30/14 2226 12/01/14 0406  Weight: 46.131 kg (101 lb 11.2 oz) 46.04 kg (101 lb 8 oz)    Exam:  General: Alert, awake, oriented x3, in no acute distress.  HEENT: No bruits, no goiter.  Heart: Regular rate and rhythm. Lungs: Good air movement, clear Abdomen: Soft, nontender, nondistended, positive bowel sounds.   Data Reviewed: Basic Metabolic Panel:  Recent Labs Lab 11/30/14 1401 11/30/14 2129 11/30/14 2240 12/01/14 0401  NA 139 138  --  142  K 3.9 3.9  --  3.6*  CL 95* 97  --  100  CO2 24 25  --  25  GLUCOSE 113* 90  --  109*  BUN 5*  8  --  13  CREATININE 0.54 0.48* 0.53 0.56  CALCIUM 8.6 8.1*  --  8.4  MG  --   --  1.3* 1.3*  PHOS  --   --  3.1 4.5   Liver Function Tests:  Recent Labs Lab 11/30/14 2129 12/01/14 0401  AST 37 36  ALT 16 16  ALKPHOS 113 106  BILITOT 0.8 0.5  PROT 7.7 7.3  ALBUMIN 2.9* 2.7*   No results for input(s): LIPASE, AMYLASE in the last 168 hours. No results for input(s): AMMONIA in the last 168 hours. CBC:  Recent Labs Lab 11/30/14 1401 11/30/14 2240 12/01/14 0401  WBC 5.6 4.1 4.4  HGB 12.1 10.2* 10.8*  HCT 36.8 31.1* 32.6*  MCV 110.5* 110.3* 110.5*  PLT 246 208 219   Cardiac Enzymes:  Recent Labs Lab 11/30/14 2240 12/01/14 0401  TROPONINI <0.30 <0.30   BNP (last 3 results) No results for input(s): PROBNP in the last 8760 hours. CBG: No results for input(s): GLUCAP in the last 168 hours.  No results found for this or any previous visit (from the past 240 hour(s)).   Studies: Dg Chest 2 View  11/30/2014   CLINICAL DATA:  One month history of right-sided chest pain, shortness of breath, numbness and tingling. Three  week history of cough and fever.  EXAM: CHEST  2 VIEW  COMPARISON:  Two-view chest x-ray 07/11/2014, 01/06/2014, 05/17/2010. CT chest 04/23/2014.  FINDINGS: Cardiac silhouette normal in size, unchanged. Thoracic aorta atherosclerotic, unchanged. Hilar and mediastinal contours otherwise unremarkable. Mildly prominent bronchovascular markings diffusely and mild central peribronchial thickening, more so than on the most recent prior examination. Lungs otherwise clear. No localized airspace consolidation. No pleural effusions. No pneumothorax. Normal pulmonary vascularity. Healed fractures involving the left anterolateral 4th, 5th and sixth ribs again noted.  IMPRESSION: Stable mild changes of chronic bronchitis and/or asthma. No acute cardiopulmonary disease.   Electronically Signed   By: Hulan Saashomas  Lawrence M.D.   On: 11/30/2014 15:16   Ct Angio Chest Pe W/cm &/or  Wo Cm  12/01/2014   CLINICAL DATA:  Shortness of breath and pain in the right chest upon exertion for months.  EXAM: CT ANGIOGRAPHY CHEST WITH CONTRAST  TECHNIQUE: Multidetector CT imaging of the chest was performed using the standard protocol during bolus administration of intravenous contrast. Multiplanar CT image reconstructions and MIPs were obtained to evaluate the vascular anatomy.  CONTRAST:  100mL OMNIPAQUE IOHEXOL 350 MG/ML SOLN  COMPARISON:  05/26/2009  FINDINGS: Technically adequate study with good opacification of the central and segmental pulmonary arteries. No focal filling defects demonstrated. No evidence for significant pulmonary embolus.  Normal heart size. Normal caliber thoracic aorta. No aortic dissection. Great vessel origins are patent. Esophagus is decompressed. No significant lymphadenopathy in the chest.  Emphysematous changes throughout the lungs. No focal airspace disease or consolidation. Calcified granuloma in the left lung base. No pleural effusions. No pneumothorax. Multiple old bilateral rib fractures.  Included portions of the upper abdominal organs demonstrate heterogeneous fatty infiltration in the liver. Cysts in the left kidney.  Review of the MIP images confirms the above findings.  IMPRESSION: No evidence of significant pulmonary embolus. Emphysematous changes in the lungs. No evidence of active pulmonary disease. Heterogeneous fatty infiltration of the liver.   Electronically Signed   By: Burman NievesWilliam  Stevens M.D.   On: 12/01/2014 05:56    Scheduled Meds: . aspirin EC  81 mg Oral Daily  . budesonide-formoterol  2 puff Inhalation BID  . cefTRIAXone (ROCEPHIN) IVPB 1 gram/50 mL D5W  1 g Intravenous Q24H  . docusate sodium  100 mg Oral BID  . DULoxetine  60 mg Oral Daily  . enoxaparin (LOVENOX) injection  40 mg Subcutaneous Q24H  . guaiFENesin  600 mg Oral BID  . ipratropium  0.5 mg Nebulization Q6H  . lidocaine-EPINEPHrine  20 mL Infiltration Once  . sodium chloride   1,000 mL Intravenous Once  . sodium chloride  3 mL Intravenous Q12H  . thiamine  100 mg Oral Daily  . vancomycin  750 mg Intravenous Q12H   Continuous Infusions:    Marinda ElkFELIZ ORTIZ, ABRAHAM  [facility] Pager [phone]. If 8PM-8AM, please contact night-coverage at [email or web address], password [email or web address] 12/01/2014, 9:12 AM  LOS: 1 day

## 2014-12-02 LAB — URINE CULTURE
CULTURE: NO GROWTH
Colony Count: NO GROWTH

## 2014-12-07 ENCOUNTER — Inpatient Hospital Stay (HOSPITAL_COMMUNITY)
Admission: EM | Admit: 2014-12-07 | Discharge: 2014-12-09 | DRG: 894 | Discharge status: Against Medical Advice | Payer: Self-pay | Attending: Internal Medicine | Admitting: Internal Medicine

## 2014-12-07 ENCOUNTER — Encounter (HOSPITAL_COMMUNITY): Payer: Self-pay | Admitting: Emergency Medicine

## 2014-12-07 ENCOUNTER — Emergency Department (HOSPITAL_COMMUNITY): Payer: MEDICAID

## 2014-12-07 DIAGNOSIS — F10230 Alcohol dependence with withdrawal, uncomplicated: Secondary | ICD-10-CM

## 2014-12-07 DIAGNOSIS — R079 Chest pain, unspecified: Secondary | ICD-10-CM

## 2014-12-07 DIAGNOSIS — K219 Gastro-esophageal reflux disease without esophagitis: Secondary | ICD-10-CM | POA: Diagnosis present

## 2014-12-07 DIAGNOSIS — Z720 Tobacco use: Secondary | ICD-10-CM | POA: Diagnosis present

## 2014-12-07 DIAGNOSIS — D539 Nutritional anemia, unspecified: Secondary | ICD-10-CM | POA: Diagnosis present

## 2014-12-07 DIAGNOSIS — F10939 Alcohol use, unspecified with withdrawal, unspecified: Secondary | ICD-10-CM | POA: Diagnosis present

## 2014-12-07 DIAGNOSIS — F1020 Alcohol dependence, uncomplicated: Secondary | ICD-10-CM | POA: Diagnosis present

## 2014-12-07 DIAGNOSIS — F419 Anxiety disorder, unspecified: Secondary | ICD-10-CM | POA: Diagnosis present

## 2014-12-07 DIAGNOSIS — L0233 Carbuncle of buttock: Secondary | ICD-10-CM | POA: Diagnosis present

## 2014-12-07 DIAGNOSIS — R0789 Other chest pain: Secondary | ICD-10-CM | POA: Diagnosis present

## 2014-12-07 DIAGNOSIS — Z6820 Body mass index (BMI) 20.0-20.9, adult: Secondary | ICD-10-CM

## 2014-12-07 DIAGNOSIS — Z803 Family history of malignant neoplasm of breast: Secondary | ICD-10-CM

## 2014-12-07 DIAGNOSIS — J340 Abscess, furuncle and carbuncle of nose: Secondary | ICD-10-CM | POA: Diagnosis present

## 2014-12-07 DIAGNOSIS — E43 Unspecified severe protein-calorie malnutrition: Secondary | ICD-10-CM | POA: Diagnosis present

## 2014-12-07 DIAGNOSIS — L0292 Furuncle, unspecified: Secondary | ICD-10-CM | POA: Diagnosis present

## 2014-12-07 DIAGNOSIS — J449 Chronic obstructive pulmonary disease, unspecified: Secondary | ICD-10-CM | POA: Diagnosis present

## 2014-12-07 DIAGNOSIS — M1990 Unspecified osteoarthritis, unspecified site: Secondary | ICD-10-CM | POA: Diagnosis present

## 2014-12-07 DIAGNOSIS — F10239 Alcohol dependence with withdrawal, unspecified: Principal | ICD-10-CM | POA: Diagnosis present

## 2014-12-07 DIAGNOSIS — L0293 Carbuncle, unspecified: Secondary | ICD-10-CM

## 2014-12-07 LAB — URINALYSIS, ROUTINE W REFLEX MICROSCOPIC
BILIRUBIN URINE: NEGATIVE
Glucose, UA: NEGATIVE mg/dL
HGB URINE DIPSTICK: NEGATIVE
KETONES UR: NEGATIVE mg/dL
Leukocytes, UA: NEGATIVE
NITRITE: NEGATIVE
PROTEIN: NEGATIVE mg/dL
Specific Gravity, Urine: 1.025 (ref 1.005–1.030)
UROBILINOGEN UA: 0.2 mg/dL (ref 0.0–1.0)
pH: 7 (ref 5.0–8.0)

## 2014-12-07 LAB — COMPREHENSIVE METABOLIC PANEL
ALBUMIN: 2.9 g/dL — AB (ref 3.5–5.2)
ALK PHOS: 82 U/L (ref 39–117)
ALT: 18 U/L (ref 0–35)
AST: 63 U/L — AB (ref 0–37)
Anion gap: 18 — ABNORMAL HIGH (ref 5–15)
BUN: 9 mg/dL (ref 6–23)
CALCIUM: 8.6 mg/dL (ref 8.4–10.5)
CO2: 22 mEq/L (ref 19–32)
Chloride: 100 mEq/L (ref 96–112)
Creatinine, Ser: 0.58 mg/dL (ref 0.50–1.10)
GFR calc Af Amer: >90 mL/min (ref >90)
GFR calc non Af Amer: >90 mL/min (ref >90)
Glucose, Bld: 87 mg/dL (ref 70–99)
POTASSIUM: 4.2 meq/L (ref 3.7–5.3)
SODIUM: 140 meq/L (ref 137–147)
TOTAL PROTEIN: 6.8 g/dL (ref 6.0–8.3)
Total Bilirubin: <0.2 mg/dL — ABNORMAL LOW (ref 0.3–1.2)

## 2014-12-07 LAB — CBC WITH DIFFERENTIAL/PLATELET
BASOS ABS: 0.1 10*3/uL (ref 0.0–0.1)
Basophils Relative: 1 % (ref 0–1)
EOS ABS: 0.1 10*3/uL (ref 0.0–0.7)
Eosinophils Relative: 1 % (ref 0–5)
HCT: 31 % — ABNORMAL LOW (ref 36.0–46.0)
Hemoglobin: 10.2 g/dL — ABNORMAL LOW (ref 12.0–15.0)
LYMPHS ABS: 1.6 10*3/uL (ref 0.7–4.0)
Lymphocytes Relative: 24 % (ref 12–46)
MCH: 37 pg — ABNORMAL HIGH (ref 26.0–34.0)
MCHC: 32.9 g/dL (ref 30.0–36.0)
MCV: 112.3 fL — AB (ref 78.0–100.0)
MONOS PCT: 9 % (ref 3–12)
Monocytes Absolute: 0.6 10*3/uL (ref 0.1–1.0)
Neutro Abs: 4.2 10*3/uL (ref 1.7–7.7)
Neutrophils Relative %: 65 % (ref 43–77)
Platelets: 286 10*3/uL (ref 150–400)
RBC: 2.76 MIL/uL — ABNORMAL LOW (ref 3.87–5.11)
RDW: 16.9 % — AB (ref 11.5–15.5)
WBC: 6.6 10*3/uL (ref 4.0–10.5)

## 2014-12-07 LAB — ETHANOL: ALCOHOL ETHYL (B): 69 mg/dL — AB (ref 0–11)

## 2014-12-07 LAB — TROPONIN I: Troponin I: <0.3 ng/mL (ref <0.30)

## 2014-12-07 LAB — RAPID URINE DRUG SCREEN, HOSP PERFORMED
Amphetamines: NOT DETECTED
BARBITURATES: NOT DETECTED
Benzodiazepines: NOT DETECTED
Cocaine: NOT DETECTED
Opiates: NOT DETECTED
TETRAHYDROCANNABINOL: NOT DETECTED

## 2014-12-07 MED ORDER — MAGNESIUM HYDROXIDE 400 MG/5ML PO SUSP: 30.0000 mL | Freq: Every day | ORAL | Status: DC | PRN

## 2014-12-07 MED ORDER — ONDANSETRON HCL 4 MG PO TABS: 4.0000 mg | ORAL_TABLET | Freq: Four times a day (QID) | ORAL | Status: DC | PRN

## 2014-12-07 MED ORDER — SODIUM CHLORIDE 0.9 % IV BOLUS (SEPSIS)
1000.0000 mL | Freq: Once | INTRAVENOUS | Status: AC
  Administered 2014-12-07: 1000 mL via INTRAVENOUS

## 2014-12-07 MED ORDER — OXYCODONE-ACETAMINOPHEN 5-325 MG PO TABS
1.0000 | ORAL_TABLET | Freq: Four times a day (QID) | ORAL | Status: DC | PRN
  Administered 2014-12-08: 1 via ORAL
  Administered 2014-12-08: 2 via ORAL
  Filled 2014-12-07: qty 1
  Filled 2014-12-07: qty 2

## 2014-12-07 MED ORDER — ONDANSETRON HCL 4 MG/2ML IJ SOLN: 4.0000 mg | Freq: Four times a day (QID) | INTRAMUSCULAR | Status: DC | PRN

## 2014-12-07 MED ORDER — DIPHENHYDRAMINE HCL 25 MG PO CAPS: 50.0000 mg | ORAL_CAPSULE | Freq: Four times a day (QID) | ORAL | Status: DC | PRN

## 2014-12-07 MED ORDER — IBUPROFEN 400 MG PO TABS
400.0000 mg | ORAL_TABLET | Freq: Four times a day (QID) | ORAL | Status: DC | PRN
  Filled 2014-12-07: qty 1

## 2014-12-07 MED ORDER — LORAZEPAM 2 MG/ML IJ SOLN
4.0000 mg | Freq: Once | INTRAMUSCULAR | Status: AC
  Administered 2014-12-07: 4 mg via INTRAVENOUS
  Filled 2014-12-07: qty 2

## 2014-12-07 MED ORDER — ENSURE COMPLETE PO LIQD: 237.0000 mL | Freq: Two times a day (BID) | ORAL | Status: DC

## 2014-12-07 MED ORDER — LORAZEPAM 2 MG/ML IJ SOLN
1.0000 mg | Freq: Once | INTRAMUSCULAR | Status: AC
  Administered 2014-12-07: 1 mg via INTRAVENOUS
  Filled 2014-12-07: qty 1

## 2014-12-07 MED ORDER — DULOXETINE HCL 60 MG PO CPEP
60.0000 mg | ORAL_CAPSULE | Freq: Every day | ORAL | Status: DC
  Administered 2014-12-07 – 2014-12-08 (×2): 60 mg via ORAL
  Filled 2014-12-07 (×3): qty 1

## 2014-12-07 MED ORDER — KETOROLAC TROMETHAMINE 30 MG/ML IJ SOLN
30.0000 mg | Freq: Once | INTRAMUSCULAR | Status: AC
  Administered 2014-12-07: 30 mg via INTRAVENOUS
  Filled 2014-12-07: qty 1

## 2014-12-07 MED ORDER — BACITRACIN ZINC 500 UNIT/GM EX OINT
TOPICAL_OINTMENT | Freq: Two times a day (BID) | CUTANEOUS | Status: DC
  Administered 2014-12-07 – 2014-12-08 (×2): via TOPICAL
  Administered 2014-12-08 (×2): 1 via TOPICAL
  Filled 2014-12-07 (×2): qty 28.35

## 2014-12-07 MED ORDER — IPRATROPIUM-ALBUTEROL 0.5-2.5 (3) MG/3ML IN SOLN: 3.0000 mL | Freq: Four times a day (QID) | RESPIRATORY_TRACT | Status: DC | PRN

## 2014-12-07 MED ORDER — BOOST / RESOURCE BREEZE PO LIQD
1.0000 | Freq: Three times a day (TID) | ORAL | Status: DC
  Administered 2014-12-08 (×3): 1 via ORAL

## 2014-12-07 MED ORDER — BUDESONIDE-FORMOTEROL FUMARATE 80-4.5 MCG/ACT IN AERO
2.0000 | INHALATION_SPRAY | Freq: Two times a day (BID) | RESPIRATORY_TRACT | Status: DC
  Administered 2014-12-07 – 2014-12-08 (×3): 2 via RESPIRATORY_TRACT
  Filled 2014-12-07 (×2): qty 6.9

## 2014-12-07 MED ORDER — BACITRACIN 500 UNIT/GM EX OINT
1.0000 "application " | TOPICAL_OINTMENT | Freq: Two times a day (BID) | CUTANEOUS | Status: DC
  Filled 2014-12-07 (×2): qty 0.9

## 2014-12-07 MED ORDER — LORAZEPAM 2 MG/ML IJ SOLN
2.0000 mg | INTRAMUSCULAR | Status: DC
  Administered 2014-12-07 (×2): 2 mg via INTRAVENOUS
  Filled 2014-12-07 (×2): qty 1

## 2014-12-07 MED ORDER — LORAZEPAM 1 MG PO TABS
1.0000 mg | ORAL_TABLET | Freq: Once | ORAL | Status: AC
  Administered 2014-12-07: 1 mg via ORAL
  Filled 2014-12-07: qty 1

## 2014-12-07 MED ORDER — ACETAMINOPHEN 650 MG RE SUPP: 650.0000 mg | Freq: Four times a day (QID) | RECTAL | Status: DC | PRN

## 2014-12-07 MED ORDER — LORAZEPAM 0.5 MG PO TABS: 1.0000 mg | ORAL_TABLET | Freq: Once | ORAL | Status: DC

## 2014-12-07 MED ORDER — IPRATROPIUM-ALBUTEROL 18-103 MCG/ACT IN AERO: 2.0000 | INHALATION_SPRAY | Freq: Four times a day (QID) | RESPIRATORY_TRACT | Status: DC | PRN

## 2014-12-07 MED ORDER — POTASSIUM CHLORIDE IN NACL 20-0.9 MEQ/L-% IV SOLN
INTRAVENOUS | Status: DC
  Administered 2014-12-07 – 2014-12-08 (×2): via INTRAVENOUS
  Administered 2014-12-08: 1000 mL via INTRAVENOUS
  Administered 2014-12-09: 02:00:00 via INTRAVENOUS
  Filled 2014-12-07 (×8): qty 1000

## 2014-12-07 MED ORDER — NICOTINE 21 MG/24HR TD PT24
21.0000 mg | MEDICATED_PATCH | Freq: Every day | TRANSDERMAL | Status: DC
  Administered 2014-12-07 – 2014-12-08 (×2): 21 mg via TRANSDERMAL
  Filled 2014-12-07 (×3): qty 1

## 2014-12-07 MED ORDER — LORAZEPAM 2 MG/ML IJ SOLN: 2.0000 mg | INTRAMUSCULAR | Status: DC

## 2014-12-07 MED ORDER — LORAZEPAM 0.5 MG PO TABS
0.0000 mg | ORAL_TABLET | Freq: Four times a day (QID) | ORAL | Status: AC
  Administered 2014-12-08: 3 mg via ORAL
  Administered 2014-12-08: 1 mg via ORAL
  Filled 2014-12-07: qty 2
  Filled 2014-12-07: qty 6

## 2014-12-07 MED ORDER — LORAZEPAM 2 MG/ML IJ SOLN
2.0000 mg | Freq: Once | INTRAMUSCULAR | Status: AC
  Administered 2014-12-07: 2 mg via INTRAVENOUS
  Filled 2014-12-07: qty 1

## 2014-12-07 MED ORDER — VITAMIN B-1 100 MG PO TABS
100.0000 mg | ORAL_TABLET | Freq: Every day | ORAL | Status: DC
  Administered 2014-12-08: 100 mg via ORAL
  Filled 2014-12-07 (×3): qty 1

## 2014-12-07 MED ORDER — ONDANSETRON 8 MG PO TBDP: 8.0000 mg | ORAL_TABLET | Freq: Once | ORAL | Status: DC

## 2014-12-07 MED ORDER — ENOXAPARIN SODIUM 40 MG/0.4ML ~~LOC~~ SOLN
40.0000 mg | Freq: Every day | SUBCUTANEOUS | Status: DC
  Administered 2014-12-07 – 2014-12-08 (×2): 40 mg via SUBCUTANEOUS
  Filled 2014-12-07 (×3): qty 0.4

## 2014-12-07 MED ORDER — TIOTROPIUM BROMIDE MONOHYDRATE 18 MCG IN CAPS
18.0000 ug | ORAL_CAPSULE | Freq: Every day | RESPIRATORY_TRACT | Status: DC
  Administered 2014-12-08: 18 ug via RESPIRATORY_TRACT
  Filled 2014-12-07: qty 5

## 2014-12-07 MED ORDER — LORAZEPAM 2 MG/ML IJ SOLN
1.0000 mg | Freq: Four times a day (QID) | INTRAMUSCULAR | Status: DC
  Administered 2014-12-07: 1 mg via INTRAVENOUS
  Filled 2014-12-07: qty 1

## 2014-12-07 MED ORDER — THIAMINE HCL 100 MG/ML IJ SOLN
100.0000 mg | Freq: Every day | INTRAMUSCULAR | Status: DC
  Administered 2014-12-07: 100 mg via INTRAVENOUS
  Filled 2014-12-07 (×3): qty 1

## 2014-12-07 MED ORDER — SULFAMETHOXAZOLE-TRIMETHOPRIM 800-160 MG PO TABS
1.0000 | ORAL_TABLET | Freq: Two times a day (BID) | ORAL | Status: DC
  Administered 2014-12-07 – 2014-12-08 (×4): 1 via ORAL
  Filled 2014-12-07 (×7): qty 1

## 2014-12-07 MED ORDER — LORAZEPAM 2 MG/ML IJ SOLN
0.0000 mg | Freq: Four times a day (QID) | INTRAMUSCULAR | Status: AC
Start: 2014-12-07 — End: 2014-12-09
  Administered 2014-12-07 (×2): 2 mg via INTRAVENOUS
  Administered 2014-12-08: 4 mg via INTRAVENOUS
  Administered 2014-12-08 – 2014-12-09 (×3): 2 mg via INTRAVENOUS
  Filled 2014-12-07 (×5): qty 1

## 2014-12-07 MED ORDER — ACETAMINOPHEN 325 MG PO TABS: 650.0000 mg | ORAL_TABLET | Freq: Four times a day (QID) | ORAL | Status: DC | PRN

## 2014-12-07 MED ORDER — SODIUM CHLORIDE 0.9 % IV SOLN
INTRAVENOUS | Status: DC
  Administered 2014-12-07: 08:00:00 via INTRAVENOUS

## 2014-12-07 NOTE — ED Notes (Signed)
Kendal HymenBonnie with IV team called to report she is coming to see patient for IV attempt

## 2014-12-07 NOTE — ED Notes (Signed)
Pt c/o "big round ball" in nose.  Swelling visible in right nare.

## 2014-12-07 NOTE — Progress Notes (Signed)
Patient transferred via bed to 2C15.

## 2014-12-07 NOTE — H&P (Signed)
[facility] History and Physical  Rhonda Mitchell WUJ:[id number] DOB: [birth year] DOA: 12/07/2014  Referring physician: EDP PCP: [name], MD   Chief Complaint: chest pain  HPI: Rhonda Mitchell is a 53 y.o. female  With h/o alcoholism presents to ed with right sided CP. Reproducible with palpation.  Admitted recently for same and ruled out for MI. Had negative CTA chest.  Also c/o "lump" in right nare.  Was started on antibiotics and was supposed to f/u with ENT, but never did.  Also had carbuncle on buttock I and d'd at that time.  Has been compliant with bactrim.  While in ED, noted to be tremulous.  Admits to drinking a pint of liquor daily and has tried to quit over the past few days. Admitted for acute alcohol withdrawal.   Review of Systems:  As per HPI. Complete ROS otherwise negative.  Past Medical History  Diagnosis Date  . COPD (chronic obstructive pulmonary disease)   . Anxiety   . Shortness of breath   . Arthritis   . GERD (gastroesophageal reflux disease)   . Full dentures   . Insomnia    Past Surgical History  Procedure Laterality Date  . Arm surgery    . Hand surgery  1990    both rt/lt carpal tunnels  . Shoulder surgery      right and left-age [age]  . Orif ulnar / radial shaft fracture  1990    right-got infection-had total 10 surgeies 1990  . Orif ulnar fracture Left 05/23/2014    Procedure: OPEN REDUCTION INTERNAL FIXATION (ORIF) LEFT ULNAR FRACTURE;  Surgeon: Harvie Junior, MD;  Location: [facility];  Service: Orthopedics;  Laterality: Left;   Social History:  reports that she has been smoking Cigarettes.  She has been smoking about 1.00 pack per day. She has never used smokeless tobacco. She reports that she drinks about 1.8 oz of alcohol per week. She reports that she does not use illicit drugs.  No Known Allergies  Family History  Problem Relation Age of Onset  . Breast cancer Mother      Prior to Admission medications     Medication Sig Start Date End Date Taking? Authorizing Provider  albuterol-ipratropium (COMBIVENT) 18-103 MCG/ACT inhaler Inhale 2 puffs into the lungs every 6 (six) hours as needed for wheezing.   Yes Historical Provider, MD  budesonide-formoterol (SYMBICORT) 80-4.5 MCG/ACT inhaler Inhale 2 puffs into the lungs 2 (two) times daily.   Yes Historical Provider, MD  ibuprofen (ADVIL,MOTRIN) 200 MG tablet Take 400 mg by mouth every 6 (six) hours as needed for mild pain.   Yes Historical Provider, MD  loratadine (CLARITIN) 10 MG tablet Take 10 mg by mouth daily.   Yes Historical Provider, MD  magnesium citrate SOLN Take 1 Bottle by mouth once.   Yes Historical Provider, MD  MAGNESIUM PO Take 1 tablet by mouth daily.   Yes Historical Provider, MD  POTASSIUM PO Take 1 tablet by mouth daily.   Yes Historical Provider, MD  sulfamethoxazole-trimethoprim (BACTRIM DS,SEPTRA DS) 800-160 MG per tablet Take 1 tablet by mouth every 12 (twelve) hours. 12/01/14  Yes Marinda Elk, MD  tiotropium (SPIRIVA) 18 MCG inhalation capsule Place 18 mcg into inhaler and inhale daily.   Yes Historical Provider, MD  b complex vitamins tablet Take 1 tablet by mouth daily.    Historical Provider, MD  diphenhydramine-acetaminophen (TYLENOL PM) 25-500 MG TABS Take 1 tablet by mouth at bedtime as needed.  Historical Provider, MD  DULoxetine (CYMBALTA) 60 MG capsule Take 60 mg by mouth daily.    Historical Provider, MD  oxyCODONE-acetaminophen (PERCOCET) 5-325 MG per tablet Take 1-2 tablets by mouth every 4 (four) hours as needed. Patient not taking: Reported on 11/30/2014 04/23/14   Geoffery Lyonsouglas Delo, MD  oxyCODONE-acetaminophen (PERCOCET/ROXICET) 5-325 MG per tablet Take 1-2 tablets by mouth every 6 (six) hours as needed for severe pain. Patient not taking: Reported on 11/30/2014 05/23/14   Matthew FolksJames G Bethune, PA-C   Physical Exam: Filed Vitals:   12/07/14 0745 12/07/14 0757 12/07/14 0800 12/07/14 0838  BP: 142/76 144/84 159/79  139/77  Pulse:  82 79 79  Temp:  98.4 F (36.9 C)  98.8 F (37.1 C)  TempSrc:  Oral  Oral  Resp: 25 20 23 20   Height:      Weight:      SpO2:  98% 94% 95%    Wt Readings from Last 3 Encounters:  12/07/14 47.174 kg (104 lb)  12/01/14 46.04 kg (101 lb 8 oz)  05/23/14 50.803 kg (112 lb)  BP 152/73 mmHg  Pulse 81  Temp(Src) 98.5 F (36.9 C) (Axillary)  Resp 28  Ht 5' (1.524 m)  Wt 48.399 kg (106 lb 11.2 oz)  BMI 20.84 kg/m2  SpO2 93%  General Appearance:    Alert, cooperative, EXTREMELY tremulous. oriented  Head:    Normocephalic, without obvious abnormality, atraumatic  Eyes:    PERRL, conjunctiva/corneas clear, EOM's intact  Nose:   Right nare with nodule. No drainage, erytherma, drainage.  Throat:   Lips, mucosa, and tongue normal; teeth and gums normal  Neck:   Supple, symmetrical, trachea midline, no adenopathy;    thyroid:  no enlargement/tenderness/nodules; no carotid   bruit or JVD  Back:     Symmetric, no curvature, ROM normal, no CVA tenderness  Lungs:     Clear to auscultation bilaterally, respirations unlabored  Chest Wall:    Right chest wall tenderness, reproduces chest pain   Heart:    Regular rate and rhythm, S1 and S2 normal, no murmur, rub   or gallop  Abdomen:     Soft, non-tender, bowel sounds active all four quadrants,    no masses, no organomegaly  Genitalia:   deferred  Rectal:   deferred  Extremities:   Extremities normal, atraumatic, no cyanosis or edema  Pulses:   2+ and symmetric all extremities  Skin:   Buttock with carbuncle, nondraining  Lymph nodes:   Cervical, supraclavicular, and axillary nodes normal  Neurologic:   CNII-XII intact, normal strength, very tremulous     Psych: cooperative        Labs on Admission:  Basic Metabolic Panel:  Recent Labs Lab 11/30/14 1401 11/30/14 2129 11/30/14 2240 12/01/14 0401 12/07/14 0544  NA 139 138  --  142 140  K 3.9 3.9  --  3.6* 4.2  CL 95* 97  --  100 100  CO2 24 25  --  25 22    GLUCOSE 113* 90  --  109* 87  BUN 5* 8  --  13 9  CREATININE 0.54 0.48* 0.53 0.56 0.58  CALCIUM 8.6 8.1*  --  8.4 8.6  MG  --   --  1.3* 1.3*  --   PHOS  --   --  3.1 4.5  --    Liver Function Tests:  Recent Labs Lab 11/30/14 2129 12/01/14 0401 12/07/14 0544  AST 37 36 63*  ALT 16 16 18  ALKPHOS 113 106 82  BILITOT 0.8 0.5 <0.2*  PROT 7.7 7.3 6.8  ALBUMIN 2.9* 2.7* 2.9*   No results for input(s): LIPASE, AMYLASE in the last 168 hours. No results for input(s): AMMONIA in the last 168 hours. CBC:  Recent Labs Lab 11/30/14 1401 11/30/14 2240 12/01/14 0401 12/07/14 0437  WBC 5.6 4.1 4.4 6.6  NEUTROABS  --   --   --  4.2  HGB 12.1 10.2* 10.8* 10.2*  HCT 36.8 31.1* 32.6* 31.0*  MCV 110.5* 110.3* 110.5* 112.3*  PLT 246 208 219 286   Cardiac Enzymes:  Recent Labs Lab 11/30/14 2240 12/01/14 0401 12/07/14 0437 12/07/14 0544  TROPONINI <0.30 <0.30 <0.30 <0.30    BNP (last 3 results) No results for input(s): PROBNP in the last 8760 hours. CBG: No results for input(s): GLUCAP in the last 168 hours.  Radiological Exams on Admission: Dg Chest Port 1 View  12/07/2014   CLINICAL DATA:  RIGHT-sided chest drainage shortness of breath, unable to sleep due to pain. Tremor.  EXAM: PORTABLE CHEST - 1 VIEW  COMPARISON:  CT angiogram of the chest December 01, 2014  FINDINGS: Cardiomediastinal silhouette is unremarkable. Stable mild bronchitic changes. The lungs are clear without pleural effusions or focal consolidations. Trachea projects midline and there is no pneumothorax. Soft tissue planes and included osseous structures are non-suspicious. Remote LEFT lateral rib fractures better seen on prior imaging.  IMPRESSION: Stable mild bronchitic changes.   Electronically Signed   By: Awilda Metroourtnay  Bloomer   On: 12/07/2014 04:35    EKG: tracing reviewed. Wandering baseline. NSR. Unchanged anteroseptal flipped Ts  Assessment/Plan Principal Problem:   Alcohol withdrawal: ciwa  protocol. Thiamine. Social work consult Active Problems:   COPD (chronic obstructive pulmonary disease): continue home regimen   Alcohol dependence   Protein-calorie malnutrition, severe   Musculoskeletal chest pain: will give a dose of toradol and prn tylenol, ibuprofen, percocet.   Tobacco abuse: nicotine patch   Carbuncle and furuncle, buttock and right nare: warm compress and sitz bath when more stable/less tremulous. May need I and D, but defer until more stable. Continue bactrim.   Macrocytic anemia, alcohol related   Time spent: 60 MIN  Rhonda Mitchell L [facility]

## 2014-12-07 NOTE — ED Notes (Signed)
Dr. Ranae PalmsYelverton states to start patient on CIWA protocol

## 2014-12-07 NOTE — Progress Notes (Signed)
INITIAL NUTRITION ASSESSMENT  DOCUMENTATION CODES Per approved criteria  -Severe malnutrition in the context of chronic illness   INTERVENTION: Resource Breeze po TID, each supplement provides 250 kcal and 9 grams of protein  NUTRITION DIAGNOSIS: Malnutrition related to chronic alcoholism as evidenced by severe fat and muscle depletion.   Goal: Pt to meet >/= 90% of their estimated nutrition needs   Monitor:  PO intake, supplement acceptance, skin, labs  Reason for Assessment: Pt identified as at nutrition risk on the Malnutrition Screen Tool  53 y.o. female  Admitting Dx: Chest pain  ASSESSMENT: Pt with past history of drug abuse presents with chronic chest pain, tachycardia, evidence of dehydration, no mass excoriations with abscess of the left buttock and right side of the nose.   Pt very sleepy. Pt ate well this am, lunch at bedside but pt was sleeping. Per pt she drinks 1 pint of Vodka per day and starts drinking when she gets up. Pt endorses a 40 lb weight loss x 8 months due to poor appetite.  Per chart review pt with at least 15% weight loss over the last year.  Breakfast: 0 Lunch: grilled cheese Dinner: meat, veg, starch she cooks for husband.  Pt willing to try [employer] Breeze.  Nutrition Focused Physical Exam:  Subcutaneous Fat:  Orbital Region: WDL Upper Arm Region: severe depletion Thoracic and Lumbar Region: moderate depletion  Muscle:  Temple Region: mild depletion Clavicle Bone Region: moderate depletion Clavicle and Acromion Bone Region: moderate depletion Scapular Bone Region: severe depletion Dorsal Hand: WDL Patellar Region: severe depletion Anterior Thigh Region: severe depletion Posterior Calf Region: WDL  Edema: not present   Height: Ht Readings from Last 1 Encounters:  12/07/14 5' (1.524 m)    Weight: Wt Readings from Last 1 Encounters:  12/07/14 106 lb 11.2 oz (48.399 kg)    Ideal Body Weight: 45.4 kg   % Ideal Body Weight:  106%  Wt Readings from Last 10 Encounters:  12/07/14 106 lb 11.2 oz (48.399 kg)  12/01/14 101 lb 8 oz (46.04 kg)  05/23/14 112 lb (50.803 kg)  04/23/14 120 lb (54.432 kg)  01/06/14 125 lb (56.7 kg)  08/02/13 125 lb (56.7 kg)    Usual Body Weight: 125 lb   % Usual Body Weight: 85%  BMI:  Body mass index is 20.84 kg/(m^2).  Estimated Nutritional Needs: Kcal: 1500-1700 Protein: 70-90 grams Fluid: > 1.5 L/day  Skin:  Abscess on left buttocks and right side of nose  Diet Order: Diet regular Meal Completion: 80%  EDUCATION NEEDS: -No education needs identified at this time   Intake/Output Summary (Last 24 hours) at 12/07/14 1403 Last data filed at 12/07/14 1204  Gross per 24 hour  Intake    120 ml  Output    100 ml  Net     20 ml    Last BM: PTA   Labs:   Recent Labs Lab 11/30/14 2129 11/30/14 2240 12/01/14 0401 12/07/14 0544  NA 138  --  142 140  K 3.9  --  3.6* 4.2  CL 97  --  100 100  CO2 25  --  25 22  BUN 8  --  13 9  CREATININE 0.48* 0.53 0.56 0.58  CALCIUM 8.1*  --  8.4 8.6  MG  --  1.3* 1.3*  --   PHOS  --  3.1 4.5  --   GLUCOSE 90  --  109* 87    CBG (last 3)  No results  for input(s): GLUCAP in the last 72 hours.  Scheduled Meds: . bacitracin   Topical BID  . budesonide-formoterol  2 puff Inhalation BID  . DULoxetine  60 mg Oral Daily  . enoxaparin (LOVENOX) injection  40 mg Subcutaneous Daily  . feeding supplement (ENSURE COMPLETE)  237 mL Oral BID BM  . LORazepam  0-4 mg Intravenous 4 times per day  . LORazepam  2 mg Intravenous Q4H  . LORazepam  0-4 mg Oral 4 times per day  . LORazepam  1 mg Oral Once  . nicotine  21 mg Transdermal Daily  . ondansetron  8 mg Oral Once  . sulfamethoxazole-trimethoprim  1 tablet Oral Q12H  . thiamine  100 mg Intravenous Daily  . thiamine  100 mg Oral Daily  . tiotropium  18 mcg Inhalation Daily    Continuous Infusions: . 0.9 % NaCl with KCl 20 mEq / L 125 mL/hr at 12/07/14 1122    Past  Medical History  Diagnosis Date  . COPD (chronic obstructive pulmonary disease)   . Anxiety   . Shortness of breath   . Arthritis   . GERD (gastroesophageal reflux disease)   . Full dentures   . Insomnia     Past Surgical History  Procedure Laterality Date  . Arm surgery    . Hand surgery  1990    both rt/lt carpal tunnels  . Shoulder surgery      right and left-age [age]  . Orif ulnar / radial shaft fracture  1990    right-got infection-had total 10 surgeies 1990  . Orif ulnar fracture Left 05/23/2014    Procedure: OPEN REDUCTION INTERNAL FIXATION (ORIF) LEFT ULNAR FRACTURE;  Surgeon: Harvie JuniorJohn L Graves, MD;  Location: [facility];  Service: Orthopedics;  Laterality: Left;    Kendell BaneHeather Alakai Macbride RD, LDN, CNSC [phone] Pager [phone] After Hours Pager

## 2014-12-07 NOTE — ED Notes (Signed)
Called Dr. Ranae PalmsYelverton to report patient's anxiety, MD acknowledges, gives verbal order for 1mg  of ativan IV, and 1L bolus of NS.

## 2014-12-07 NOTE — Progress Notes (Signed)
Report called to [facility] for transfer of patient. Patient to be packed up by NT and transferred to room 2C15.

## 2014-12-07 NOTE — ED Notes (Signed)
Per EMS, patient from home with complaints of right sided chest pain and shortness of breath.  Unable to sleep from pain. Has not followed up with cardio yet for stress test as scheduled.  Uncontrollable tremor noted on arrival to ER.  10mg  of albuterol given by EMS and 0.5mg  of atrovent. 12 lead unremarkable. Hx  Of COPD, emphasema. ETOH consumption daily, 1 pint of vodka, last drink was yesterday at 4pm.

## 2014-12-07 NOTE — Progress Notes (Signed)
Shift Event: Pt with an episode of non-observed fall, as she was trying to go to the bathroom. States she slipped, and landed on left elbow.  Admits to hitting her head, denies LOC. Pt on CIWA protocol for alcohol withdrawal and received 2mg  Ativan 3 hours prior to fall. She denies any headache, lightheadedness, dizziness, nausea, rhinorrhea, or any new complaints since fall. Gen-pt is somnolent, yet easily arousable, AOX3. HEENT-PERRL with no signs of head/periorbital/post auricular ecchymosis. Heart- rrr;  Lungs-good air movement bilaterally. MSK- left elbow- no tenderness to palpation, good ROM.  Skin- multiple excoriations.   Will continue to monitor  Illa LevelSahar Danica Camarena [facility] [phone]

## 2014-12-07 NOTE — Progress Notes (Signed)
Rechecked CIWA after 4mg  Ativan and was 15. Paged MD to let her know of CIWA remaining 15 or greater. Will continue to monitor.

## 2014-12-07 NOTE — ED Provider Notes (Signed)
CSN: [id number]     Arrival date & time 12/07/14  0351 History   First MD Initiated Contact with Patient 12/07/14 0357     Chief Complaint  Patient presents with  . Chest Pain  . Shortness of Breath  . Anxiety     (Consider location/radiation/quality/duration/timing/severity/associated sxs/prior Treatment) HPI Patient with a history of chronic alcohol abuse and COPD presents with tremors starting this evening. She also complains of right-sided chest pain and shortness of breath. She's had the right-sided chest pains she was recently admitted to the hospital. She had a CT angiogram of the chest without any evidence of PE. She had negative troponins 3. Patient states the pain is unchanged. He's had minimal cough. No fever or chills. Patient states that she drinks a pint of vodka every day. She last drank yesterday at 4 PM. She states she is attempting to stop drinking alcohol. She denies any other coingestants. Patient was given 10 mg of albuterol in route by EMS. Past Medical History  Diagnosis Date  . COPD (chronic obstructive pulmonary disease)   . Anxiety   . Shortness of breath   . Arthritis   . GERD (gastroesophageal reflux disease)   . Full dentures   . Insomnia    Past Surgical History  Procedure Laterality Date  . Arm surgery    . Hand surgery  1990    both rt/lt carpal tunnels  . Shoulder surgery      right and left-age [age]  . Orif ulnar / radial shaft fracture  1990    right-got infection-had total 10 surgeies 1990  . Orif ulnar fracture Left 05/23/2014    Procedure: OPEN REDUCTION INTERNAL FIXATION (ORIF) LEFT ULNAR FRACTURE;  Surgeon: Harvie JuniorJohn L Graves, MD;  Location: [facility];  Service: Orthopedics;  Laterality: Left;   Family History  Problem Relation Age of Onset  . Breast cancer Mother    History  Substance Use Topics  . Smoking status: Current Every Day Smoker -- 1.00 packs/day    Types: Cigarettes  . Smokeless tobacco: Never Used  . Alcohol  Use: 1.8 oz/week    3 Glasses of wine per week     Comment: 6-pack a week   OB History    No data available     Review of Systems  Constitutional: Negative for fever.  Respiratory: Positive for cough, shortness of breath and wheezing.   Cardiovascular: Positive for chest pain. Negative for palpitations and leg swelling.  Gastrointestinal: Negative for nausea, vomiting, abdominal pain and diarrhea.  Musculoskeletal: Negative for back pain, neck pain and neck stiffness.  Skin: Negative for rash and wound.  Neurological: Positive for tremors. Negative for dizziness, weakness, numbness and headaches.  Psychiatric/Behavioral: Negative for suicidal ideas and hallucinations.  All other systems reviewed and are negative.     Allergies  Review of patient's allergies indicates no known allergies.  Home Medications   Prior to Admission medications   Medication Sig Start Date End Date Taking? Authorizing Provider  albuterol-ipratropium (COMBIVENT) 18-103 MCG/ACT inhaler Inhale 2 puffs into the lungs every 6 (six) hours as needed for wheezing.   Yes Historical Provider, MD  budesonide-formoterol (SYMBICORT) 80-4.5 MCG/ACT inhaler Inhale 2 puffs into the lungs 2 (two) times daily.   Yes Historical Provider, MD  ibuprofen (ADVIL,MOTRIN) 200 MG tablet Take 400 mg by mouth every 6 (six) hours as needed for mild pain.   Yes Historical Provider, MD  loratadine (CLARITIN) 10 MG tablet Take 10 mg by mouth  daily.   Yes Historical Provider, MD  magnesium citrate SOLN Take 1 Bottle by mouth once.   Yes Historical Provider, MD  MAGNESIUM PO Take 1 tablet by mouth daily.   Yes Historical Provider, MD  POTASSIUM PO Take 1 tablet by mouth daily.   Yes Historical Provider, MD  sulfamethoxazole-trimethoprim (BACTRIM DS,SEPTRA DS) 800-160 MG per tablet Take 1 tablet by mouth every 12 (twelve) hours. 12/01/14  Yes Marinda Elk, MD  tiotropium (SPIRIVA) 18 MCG inhalation capsule Place 18 mcg into inhaler  and inhale daily.   Yes Historical Provider, MD  b complex vitamins tablet Take 1 tablet by mouth daily.    Historical Provider, MD  diphenhydramine-acetaminophen (TYLENOL PM) 25-500 MG TABS Take 1 tablet by mouth at bedtime as needed.    Historical Provider, MD  DULoxetine (CYMBALTA) 60 MG capsule Take 60 mg by mouth daily.    Historical Provider, MD  oxyCODONE-acetaminophen (PERCOCET) 5-325 MG per tablet Take 1-2 tablets by mouth every 4 (four) hours as needed. Patient not taking: Reported on 11/30/2014 04/23/14   Geoffery Lyons, MD  oxyCODONE-acetaminophen (PERCOCET/ROXICET) 5-325 MG per tablet Take 1-2 tablets by mouth every 6 (six) hours as needed for severe pain. Patient not taking: Reported on 11/30/2014 05/23/14   Matthew Folks, PA-C   BP 117/64 mmHg  Pulse 81  Temp(Src) 98.2 F (36.8 C) (Oral)  Resp 27  Ht 5' (1.524 m)  Wt 104 lb (47.174 kg)  BMI 20.31 kg/m2  SpO2 91% Physical Exam  Constitutional: She is oriented to person, place, and time. She appears well-developed and well-nourished. No distress.  Tremulous  HENT:  Head: Normocephalic and atraumatic.  Mouth/Throat: Oropharynx is clear and moist.  Eyes: EOM are normal. Pupils are equal, round, and reactive to light.  Neck: Normal range of motion. Neck supple.  No meningismus  Cardiovascular: Normal rate and regular rhythm.   Pulmonary/Chest: Effort normal and breath sounds normal. No respiratory distress. She has no wheezes. She has no rales. She exhibits tenderness.  Mildly prolonged expiratory phase.  Abdominal: Soft. Bowel sounds are normal. She exhibits no distension and no mass. There is no tenderness. There is no rebound and no guarding.  Musculoskeletal: Normal range of motion. She exhibits no edema or tenderness.  No calf swelling or tenderness.  Neurological: She is alert and oriented to person, place, and time.  Patient moves all extremities without deficit. Sensation is intact. She does have diffuse tremors   Skin: Skin is warm and dry. No rash noted. No erythema.  Psychiatric: She has a normal mood and affect. Her behavior is normal.  Nursing note and vitals reviewed.   ED Course  Procedures (including critical care time) Labs Review Labs Reviewed  CBC WITH DIFFERENTIAL - Abnormal; Notable for the following:    RBC 2.76 (*)    Hemoglobin 10.2 (*)    HCT 31.0 (*)    MCV 112.3 (*)    MCH 37.0 (*)    RDW 16.9 (*)    All other components within normal limits  ETHANOL - Abnormal; Notable for the following:    Alcohol, Ethyl (B) 69 (*)    All other components within normal limits  COMPREHENSIVE METABOLIC PANEL - Abnormal; Notable for the following:    Albumin 2.9 (*)    AST 63 (*)    Total Bilirubin <0.2 (*)    Anion gap 18 (*)    All other components within normal limits  URINALYSIS, ROUTINE W REFLEX MICROSCOPIC  URINE  RAPID DRUG SCREEN (HOSP PERFORMED)  TROPONIN I  TROPONIN I    Imaging Review Dg Chest Port 1 View  12/07/2014   CLINICAL DATA:  RIGHT-sided chest drainage shortness of breath, unable to sleep due to pain. Tremor.  EXAM: PORTABLE CHEST - 1 VIEW  COMPARISON:  CT angiogram of the chest December 01, 2014  FINDINGS: Cardiomediastinal silhouette is unremarkable. Stable mild bronchitic changes. The lungs are clear without pleural effusions or focal consolidations. Trachea projects midline and there is no pneumothorax. Soft tissue planes and included osseous structures are non-suspicious. Remote LEFT lateral rib fractures better seen on prior imaging.  IMPRESSION: Stable mild bronchitic changes.   Electronically Signed   By: Awilda Metroourtnay  Bloomer   On: 12/07/2014 04:35     EKG Interpretation None      MDM   Final diagnoses:  Chest pain  Alcohol dependence with uncomplicated withdrawal  Chest wall pain    Patient with diffuse tremors. Improved with Ativan. Patient appears to be going through withdrawal. Placed on CIWA protocol. Discussed with Dr. Lendell CapriceSullivan. Will admit to  MedSurg bed.  Chest pain is completely reproduced with palpation of the right chest. Lungs are clear. Patient with recent workup for chest pain including CT angios which was normal. This is inconsistent with coronary artery disease.    Loren Raceravid Daly Whipkey, MD 12/07/14 [phone]

## 2014-12-07 NOTE — ED Notes (Signed)
IV team at the bedside. 

## 2014-12-07 NOTE — Progress Notes (Signed)
CIWA score 16. Will recheck in 1 hour after ativan given. If still greater than 15, will page MD per protocol.

## 2014-12-08 DIAGNOSIS — F10231 Alcohol dependence with withdrawal delirium: Secondary | ICD-10-CM

## 2014-12-08 DIAGNOSIS — L0292 Furuncle, unspecified: Secondary | ICD-10-CM

## 2014-12-08 DIAGNOSIS — J449 Chronic obstructive pulmonary disease, unspecified: Secondary | ICD-10-CM

## 2014-12-08 DIAGNOSIS — L0293 Carbuncle, unspecified: Secondary | ICD-10-CM

## 2014-12-08 LAB — MRSA PCR SCREENING: MRSA by PCR: NEGATIVE

## 2014-12-08 MED ORDER — LORAZEPAM 2 MG/ML IJ SOLN
4.0000 mg | INTRAMUSCULAR | Status: DC
  Administered 2014-12-09: 4 mg via INTRAVENOUS
  Filled 2014-12-08 (×2): qty 2

## 2014-12-08 NOTE — Progress Notes (Signed)
PROGRESS NOTE  Rhonda GillesMichelle Mitchell [id number] DOB: 08-22-1961 DOA: 12/07/2014 PCP: [name], [name], MD  HPI: Rhonda GillesMichelle Mitchell is a 53 y.o. female with h/o alcoholism presents to ed with right sided CP. Reproducible with palpation. Admitted recently for same and ruled out for MI. Had negative CTA chest. Also c/o "lump" in right nare. Was started on antibiotics and was supposed to f/u with ENT, but never did. Also had carbuncle on buttock I and d'd at that time. Has been compliant with bactrim. While in ED, noted to be tremulous. Admits to drinking a pint of liquor daily and has tried to quit over the past few days. Admitted for acute alcohol withdrawal.  Subjective / 24 H Interval events Patient with fall last night, confused, in ETOH withdrawal  Assessment/Plan: Principal Problem:   Alcohol withdrawal Active Problems:   COPD (chronic obstructive pulmonary disease)   Alcohol dependence   Protein-calorie malnutrition, severe   Musculoskeletal chest pain   Tobacco abuse   Carbuncle and furuncle   Macrocytic anemia  Alcohol withdrawal: ciwa protocol. Thiamine. Social work consult - continue to monitor in stepdown  COPD (chronic obstructive pulmonary disease): continue home regimen Alcohol dependence Protein-calorie malnutrition, severe Musculoskeletal chest pain: will give a dose of toradol and prn tylenol, ibuprofen, percocet. Tobacco abuse: nicotine patch Carbuncle and furuncle, buttock and right nare: warm compress and sitz bath when more stable/less tremulous. May need I and D, but defer until more stable. Continue bactrim. Macrocytic anemia, alcohol related   Diet: Diet regular Fluids: NS DVT Prophylaxis: Lovenox  Code Status: Full Code Family Communication: none  Disposition Plan: remain in SDU  Consultants:  None   Procedures:  None    Antibiotics  Anti-infectives    Start     Dose/Rate Route Frequency Ordered Stop   12/07/14 1100   sulfamethoxazole-trimethoprim (BACTRIM DS,SEPTRA DS) 800-160 MG per tablet 1 tablet     1 tablet Oral Every 12 hours 12/07/14 0947         Studies  Dg Chest Port 1 View  12/07/2014   CLINICAL DATA:  RIGHT-sided chest drainage shortness of breath, unable to sleep due to pain. Tremor.  EXAM: PORTABLE CHEST - 1 VIEW  COMPARISON:  CT angiogram of the chest December 01, 2014  FINDINGS: Cardiomediastinal silhouette is unremarkable. Stable mild bronchitic changes. The lungs are clear without pleural effusions or focal consolidations. Trachea projects midline and there is no pneumothorax. Soft tissue planes and included osseous structures are non-suspicious. Remote LEFT lateral rib fractures better seen on prior imaging.  IMPRESSION: Stable mild bronchitic changes.   Electronically Signed   By: Awilda Metroourtnay  Bloomer   On: 12/07/2014 04:35   Objective  Filed Vitals:   12/07/14 2115 12/07/14 2355 12/08/14 0014 12/08/14 0425  BP: 154/87 161/96 164/92 151/86  Pulse: 75 72 79 72  Temp: 98.3 F (36.8 C) 99.1 F (37.3 C) 96.8 F (36 C) 97.5 F (36.4 C)  TempSrc: Oral Oral Axillary Axillary  Resp: 21 27 28 22   Height:      Weight:    47.038 kg (103 lb 11.2 oz)  SpO2: 96% 95% 93% 91%    Intake/Output Summary (Last 24 hours) at 12/08/14 0819 Last data filed at 12/08/14 0459  Gross per 24 hour  Intake 936.67 ml  Output    600 ml  Net 336.67 ml   Filed Weights   12/07/14 0404 12/07/14 0838 12/08/14 0425  Weight: 47.174 kg (104 lb) 48.399 kg (106 lb 11.2 oz) 47.038 kg (  103 lb 11.2 oz)    Exam:  General:  NAD, drowsy but wakes up when called  Cardiovascular: RRR, no MRG  Respiratory: no wheezing, clear on anterior auscultation  Abdomen: soft, non tender  MSK: no edema  Data Reviewed: Basic Metabolic Panel:  Recent Labs Lab 12/07/14 0544  NA 140  K 4.2  CL 100  CO2 22  GLUCOSE 87  BUN 9  CREATININE 0.58  CALCIUM 8.6   Liver Function Tests:  Recent Labs Lab 12/07/14 0544    AST 63*  ALT 18  ALKPHOS 82  BILITOT <0.2*  PROT 6.8  ALBUMIN 2.9*   CBC:  Recent Labs Lab 12/07/14 0437  WBC 6.6  NEUTROABS 4.2  HGB 10.2*  HCT 31.0*  MCV 112.3*  PLT 286   Cardiac Enzymes:  Recent Labs Lab 12/07/14 0437 12/07/14 0544  TROPONINI <0.30 <0.30    Recent Results (from the past 240 hour(s))  Culture, Urine     Status: None   Collection Time: 12/01/14  7:29 AM  Result Value Ref Range Status   Specimen Description URINE, CLEAN CATCH  Final   Special Requests NONE  Final   Culture  Setup Time   Final    12/01/2014 19:15 Performed at [facility]    Colony Count NO GROWTH Performed at [facility]   Final   Culture NO GROWTH Performed at [facility]   Final   Report Status 12/02/2014 FINAL  Final  MRSA PCR Screening     Status: None   Collection Time: 12/07/14 10:47 PM  Result Value Ref Range Status   MRSA by PCR NEGATIVE NEGATIVE Final    Comment:        The GeneXpert MRSA Assay (FDA approved for NASAL specimens only), is one component of a comprehensive MRSA colonization surveillance program. It is not intended to diagnose MRSA infection nor to guide or monitor treatment for MRSA infections.      Scheduled Meds: . bacitracin   Topical BID  . budesonide-formoterol  2 puff Inhalation BID  . DULoxetine  60 mg Oral Daily  . enoxaparin (LOVENOX) injection  40 mg Subcutaneous Daily  . feeding supplement (RESOURCE BREEZE)  1 Container Oral TID BM  . LORazepam  0-4 mg Intravenous 4 times per day  . LORazepam  4 mg Intravenous Q4H  . LORazepam  0-4 mg Oral 4 times per day  . LORazepam  1 mg Oral Once  . nicotine  21 mg Transdermal Daily  . ondansetron  8 mg Oral Once  . sulfamethoxazole-trimethoprim  1 tablet Oral Q12H  . thiamine  100 mg Intravenous Daily  . thiamine  100 mg Oral Daily  . tiotropium  18 mcg Inhalation Daily   Continuous Infusions: . 0.9 % NaCl with KCl 20 mEq / L 125 mL/hr at 12/08/14 0050     Time spent: 35 minutes  Pamella Pertostin Gherghe, MD [facility] Pager [phone]. If 7 PM - 7 AM, please contact night-coverage at [email or web address], password [email or web address] 12/08/2014, 8:19 AM  LOS: 1 day

## 2014-12-08 NOTE — Progress Notes (Signed)
Upon morning report, Night Nurse reported that pt does not follow commands and fell last night. Upon assessment this morning pt tried to get up and get to restroom without calling to nursing station. Pt appears to be confused. Dr. Lafe GarinGherge at bedside. Made the MD aware of additional findings. Received new order for sitter. Will continue to monitor pt.

## 2014-12-09 DIAGNOSIS — F10239 Alcohol dependence with withdrawal, unspecified: Principal | ICD-10-CM

## 2014-12-09 MED ORDER — LORAZEPAM 2 MG/ML IJ SOLN: 2.0000 mg | Freq: Once | INTRAMUSCULAR | Status: DC

## 2014-12-09 MED ORDER — LORAZEPAM 2 MG/ML IJ SOLN: 4.0000 mg | Freq: Once | INTRAMUSCULAR | Status: DC

## 2014-12-09 MED ORDER — LORAZEPAM 2 MG/ML IJ SOLN: 1.0000 mg | INTRAMUSCULAR | Status: DC | PRN

## 2014-12-09 MED ORDER — CHLORDIAZEPOXIDE HCL 25 MG PO CAPS
50.0000 mg | ORAL_CAPSULE | Freq: Once | ORAL | Status: AC
  Administered 2014-12-09: 50 mg via ORAL
  Filled 2014-12-09: qty 2

## 2014-12-09 MED ORDER — LORAZEPAM 2 MG/ML IJ SOLN: 0.0000 mg | Freq: Four times a day (QID) | INTRAMUSCULAR | Status: DC

## 2014-12-09 NOTE — Progress Notes (Signed)
Patient significant other was contact and he stated, " He was not coming to get patient." informed patient that at this time the temperature outside was very cold, educated on the consequences of leaving against medical advise. At this time calls have been made to Ac, MD, security, and significant other to inform of patients wish to leave against medical advise. Patient still insist on signing AMA papers and leaving care. Will continue to monitor patient care. Melany GuernseyNakeiaRN

## 2014-12-09 NOTE — Progress Notes (Signed)
Patient is still demanding to go home, patient is unstable on her feet and is a fall risk. Orders carried out that were given by MD but were very ineffective. Patient is still sitting at the side of the bed placing call to significant other, and is very weary of hospital staff. Nursing interventions have included frequent reorientation, education on patient safety, medication education, alcohol withdrawal/detox education have all been repetitously provided. Will continue to monitor patient status. Melany GuernseyNakeiaRN

## 2014-12-09 NOTE — Discharge Summary (Signed)
Physician Against Medical Advice Discharge Summary  Rhonda GillesMichelle Mitchell [id number] DOB: 05-22-1961 DOA: 12/07/2014   PCP: [name], [name], MD   Admit date: 12/07/2014 Discharge date: 12/09/2014  Time spent: 15 minutes  Recommendations for Outpatient Follow-up:  1. Follow up with PCP in 1-2 weeks   Discharge Diagnoses:  Principal Problem:   Alcohol withdrawal Active Problems:   COPD (chronic obstructive pulmonary disease)   Alcohol dependence   Protein-calorie malnutrition, severe   Musculoskeletal chest pain   Tobacco abuse   Carbuncle and furuncle   Macrocytic anemia  Discharge Condition: Against Medical Advise, guarded  Filed Weights   12/07/14 0838 12/08/14 0425 12/09/14 0339  Weight: 48.399 kg (106 lb 11.2 oz) 47.038 kg (103 lb 11.2 oz) 47.2 kg (104 lb 0.9 oz)    History of present illness:  Rhonda Mitchell is a 53 y.o. femalewith h/o alcoholism presents to ed with right sided CP. Reproducible with palpation. Admitted recently for same and ruled out for MI. Had negative CTA chest. Also c/o "lump" in right nare. Was started on antibiotics and was supposed to f/u with ENT, but never did. Also had carbuncle on buttock I and d'd at that time. Has been compliant with bactrim. While in ED, noted to be tremulous. Admits to drinking a pint of liquor daily and has tried to quit over the past few days. Admitted for acute alcohol withdrawal.  Hospital Course:  Patient was admitted with ETOH withdrawal in SDU and was placed on CIWA protocol. Overnight 12/19-12/20 patient decided to leave AMA, this MD was unable to round, evaluate or discuss with the patient since she left prior to being notified. On my prior evaluation she experienced intermittent confusion alternating with clear periods. Per overnight staff, she was AxOx4 and had capacity to make medical decisions and signed AMA papers.  Carbuncle and furuncle, buttock and right nare:  patient left prior to antibiotics  being prescribed  Procedures:  None    Consultations:  None   Discharge Exam: Unable to complete      Medication List    ASK your doctor about these medications        albuterol-ipratropium 18-103 MCG/ACT inhaler  Commonly known as:  COMBIVENT  Inhale 2 puffs into the lungs every 6 (six) hours as needed for wheezing.     b complex vitamins tablet  Take 1 tablet by mouth daily.     budesonide-formoterol 80-4.5 MCG/ACT inhaler  Commonly known as:  SYMBICORT  Inhale 2 puffs into the lungs 2 (two) times daily.     diphenhydramine-acetaminophen 25-500 MG Tabs  Commonly known as:  TYLENOL PM  Take 1 tablet by mouth at bedtime as needed.     DULoxetine 60 MG capsule  Commonly known as:  CYMBALTA  Take 60 mg by mouth daily.     ibuprofen 200 MG tablet  Commonly known as:  ADVIL,MOTRIN  Take 400 mg by mouth every 6 (six) hours as needed for mild pain.     loratadine 10 MG tablet  Commonly known as:  CLARITIN  Take 10 mg by mouth daily.     magnesium citrate Soln  Take 1 Bottle by mouth once.     MAGNESIUM PO  Take 1 tablet by mouth daily.     oxyCODONE-acetaminophen 5-325 MG per tablet  Commonly known as:  PERCOCET/ROXICET  Take 1-2 tablets by mouth every 6 (six) hours as needed for severe pain.     POTASSIUM PO  Take 1 tablet by mouth  daily.     sulfamethoxazole-trimethoprim 800-160 MG per tablet  Commonly known as:  BACTRIM DS,SEPTRA DS  Take 1 tablet by mouth every 12 (twelve) hours.     tiotropium 18 MCG inhalation capsule  Commonly known as:  SPIRIVA  Place 18 mcg into inhaler and inhale daily.         The results of significant diagnostics from this hospitalization (including imaging, microbiology, ancillary and laboratory) are listed below for reference.    Significant Diagnostic Studies: Dg Chest 2 View  11/30/2014   CLINICAL DATA:  One month history of right-sided chest pain, shortness of breath, numbness and tingling. Three week history of  cough and fever.  EXAM: CHEST  2 VIEW  COMPARISON:  Two-view chest x-ray 07/11/2014, 01/06/2014, 05/17/2010. CT chest 04/23/2014.  FINDINGS: Cardiac silhouette normal in size, unchanged. Thoracic aorta atherosclerotic, unchanged. Hilar and mediastinal contours otherwise unremarkable. Mildly prominent bronchovascular markings diffusely and mild central peribronchial thickening, more so than on the most recent prior examination. Lungs otherwise clear. No localized airspace consolidation. No pleural effusions. No pneumothorax. Normal pulmonary vascularity. Healed fractures involving the left anterolateral 4th, 5th and sixth ribs again noted.  IMPRESSION: Stable mild changes of chronic bronchitis and/or asthma. No acute cardiopulmonary disease.   Electronically Signed   By: Hulan Saas M.D.   On: 11/30/2014 15:16   Ct Angio Chest Pe W/cm &/or Wo Cm  12/01/2014   CLINICAL DATA:  Shortness of breath and pain in the right chest upon exertion for months.  EXAM: CT ANGIOGRAPHY CHEST WITH CONTRAST  TECHNIQUE: Multidetector CT imaging of the chest was performed using the standard protocol during bolus administration of intravenous contrast. Multiplanar CT image reconstructions and MIPs were obtained to evaluate the vascular anatomy.  CONTRAST:  OMNIPAQUE IOHEXOL 350 MG/ML SOLN  COMPARISON:  05/26/2009  FINDINGS: Technically adequate study with good opacification of the central and segmental pulmonary arteries. No focal filling defects demonstrated. No evidence for significant pulmonary embolus.  Normal heart size. Normal caliber thoracic aorta. No aortic dissection. Great vessel origins are patent. Esophagus is decompressed. No significant lymphadenopathy in the chest.  Emphysematous changes throughout the lungs. No focal airspace disease or consolidation. Calcified granuloma in the left lung base. No pleural effusions. No pneumothorax. Multiple old bilateral rib fractures.  Included portions of the upper  abdominal organs demonstrate heterogeneous fatty infiltration in the liver. Cysts in the left kidney.  Review of the MIP images confirms the above findings.  IMPRESSION: No evidence of significant pulmonary embolus. Emphysematous changes in the lungs. No evidence of active pulmonary disease. Heterogeneous fatty infiltration of the liver.   Electronically Signed   By: Burman Nieves M.D.   On: 12/01/2014 05:56   Dg Chest Port 1 View  12/07/2014   CLINICAL DATA:  RIGHT-sided chest drainage shortness of breath, unable to sleep due to pain. Tremor.  EXAM: PORTABLE CHEST - 1 VIEW  COMPARISON:  CT angiogram of the chest December 01, 2014  FINDINGS: Cardiomediastinal silhouette is unremarkable. Stable mild bronchitic changes. The lungs are clear without pleural effusions or focal consolidations. Trachea projects midline and there is no pneumothorax. Soft tissue planes and included osseous structures are non-suspicious. Remote LEFT lateral rib fractures better seen on prior imaging.  IMPRESSION: Stable mild bronchitic changes.   Electronically Signed   By: Awilda Metro   On: 12/07/2014 04:35    Microbiology: Recent Results (from the past 240 hour(s))  Culture, Urine     Status: None  Collection Time: 12/01/14  7:29 AM  Result Value Ref Range Status   Specimen Description URINE, CLEAN CATCH  Final   Special Requests NONE  Final   Culture  Setup Time   Final    12/01/2014 19:15 Performed at [facility]    Colony Count NO GROWTH Performed at [facility]   Final   Culture NO GROWTH Performed at [facility]   Final   Report Status 12/02/2014 FINAL  Final  MRSA PCR Screening     Status: None   Collection Time: 12/07/14 10:47 PM  Result Value Ref Range Status   MRSA by PCR NEGATIVE NEGATIVE Final    Comment:        The GeneXpert MRSA Assay (FDA approved for NASAL specimens only), is one component of a comprehensive MRSA colonization surveillance program. It is  not intended to diagnose MRSA infection nor to guide or monitor treatment for MRSA infections.      Labs: Basic Metabolic Panel:  Recent Labs Lab 12/07/14 0544  NA 140  K 4.2  CL 100  CO2 22  GLUCOSE 87  BUN 9  CREATININE 0.58  CALCIUM 8.6   Liver Function Tests:  Recent Labs Lab 12/07/14 0544  AST 63*  ALT 18  ALKPHOS 82  BILITOT <0.2*  PROT 6.8  ALBUMIN 2.9*   CBC:  Recent Labs Lab 12/07/14 0437  WBC 6.6  NEUTROABS 4.2  HGB 10.2*  HCT 31.0*  MCV 112.3*  PLT 286   Cardiac Enzymes:  Recent Labs Lab 12/07/14 0437 12/07/14 0544  TROPONINI <0.30 <0.30    Signed:  Pamella PertGHERGHE, Tin Engram  [facility] 12/09/2014, 8:07 AM

## 2014-12-09 NOTE — Progress Notes (Signed)
Patient escorted off floor and off premises by security at this time. Melany GuernseyNakeiaRN

## 2014-12-09 NOTE — Progress Notes (Signed)
Charge Nurse attempted to talk with patient again about the consequences of leaving AMA and the affects of leaving without a ride in such extreme and dangerous temperatures. Patient still wants to leave and has signed AMA papers. Melany GuernseyNakeiaRN

## 2014-12-09 NOTE — Progress Notes (Signed)
Patient has been having periods of extreme agitation and confusion follow by docile periods upon which patient is hard to arouse at times. With last episode, patient was laying in bed asleep, quickly awoken and started taking off her gown, telemetry leads, pulling at her IV site. As a result of this episode, patient  IV access is infiltrated and no longer good. Patient will not allow placement of new IV stating that she is going home and will sign herself out. Called and notified charge RN, Rapid Response, MD, and Significant other of patient. Will continue to monitor patient status. Melany GuernseyNakeiaRN

## 2014-12-09 NOTE — Progress Notes (Signed)
Patient is combative toward nurse and sitter.  Tried to reorient and educate patient again. Asked patient to not swing at sitter or RN. Patient states," we are holding her against her will." She is adamant about leaving. She had put her street clothes on and was about to walk out of room when  [employer] nurse intervened. Will continue to monitor patient status. Melany GuernseyNakeiaRN

## 2015-01-03 ENCOUNTER — Emergency Department (HOSPITAL_BASED_OUTPATIENT_CLINIC_OR_DEPARTMENT_OTHER)
Admission: EM | Admit: 2015-01-03 | Discharge: 2015-01-03 | Disposition: A | Discharge status: Home / Self Care | Payer: Self-pay | Attending: Emergency Medicine | Admitting: Emergency Medicine

## 2015-01-03 ENCOUNTER — Encounter (HOSPITAL_BASED_OUTPATIENT_CLINIC_OR_DEPARTMENT_OTHER): Payer: Self-pay | Admitting: *Deleted

## 2015-01-03 DIAGNOSIS — Z8669 Personal history of other diseases of the nervous system and sense organs: Secondary | ICD-10-CM | POA: Insufficient documentation

## 2015-01-03 DIAGNOSIS — Z7951 Long term (current) use of inhaled steroids: Secondary | ICD-10-CM | POA: Insufficient documentation

## 2015-01-03 DIAGNOSIS — Z79899 Other long term (current) drug therapy: Secondary | ICD-10-CM | POA: Insufficient documentation

## 2015-01-03 DIAGNOSIS — J449 Chronic obstructive pulmonary disease, unspecified: Secondary | ICD-10-CM | POA: Insufficient documentation

## 2015-01-03 DIAGNOSIS — Z8781 Personal history of (healed) traumatic fracture: Secondary | ICD-10-CM | POA: Insufficient documentation

## 2015-01-03 DIAGNOSIS — Z720 Tobacco use: Secondary | ICD-10-CM | POA: Insufficient documentation

## 2015-01-03 DIAGNOSIS — M1990 Unspecified osteoarthritis, unspecified site: Secondary | ICD-10-CM | POA: Insufficient documentation

## 2015-01-03 DIAGNOSIS — K219 Gastro-esophageal reflux disease without esophagitis: Secondary | ICD-10-CM | POA: Insufficient documentation

## 2015-01-03 DIAGNOSIS — J3489 Other specified disorders of nose and nasal sinuses: Secondary | ICD-10-CM | POA: Insufficient documentation

## 2015-01-03 DIAGNOSIS — F419 Anxiety disorder, unspecified: Secondary | ICD-10-CM | POA: Insufficient documentation

## 2015-01-03 MED ORDER — AMOXICILLIN 500 MG PO CAPS: 500.0000 mg | ORAL_CAPSULE | Freq: Three times a day (TID) | ORAL | Status: DC

## 2015-01-03 MED ORDER — HYDROCODONE-ACETAMINOPHEN 5-325 MG PO TABS: 1.0000 | ORAL_TABLET | Freq: Four times a day (QID) | ORAL | Status: DC | PRN

## 2015-01-03 NOTE — Discharge Instructions (Signed)
Amoxicillin as prescribed.  Hydrocodone as prescribed as needed for pain.  Follow-up with ear nose and throat. The contact information for Dr. Pollyann Kennedyosen has been provided in this discharge summary. Call to arrange an appointment for the next few days.  Return to the ER if he developed bleeding, high fever, difficulty breathing, or other new and concerning symptoms.

## 2015-01-03 NOTE — ED Provider Notes (Signed)
CSN: [id number]     Arrival date & time 01/03/15  1039 History   First MD Initiated Contact with Patient 01/03/15 1109     Chief Complaint  Patient presents with  . Nasal Congestion     (Consider location/radiation/quality/duration/timing/severity/associated sxs/prior Treatment) HPI Comments: Patient is a 54 year old female with past medical history of advanced COPD. She presents today with complaints of pain in her nose. She has been having progressive discomfort and swelling to the inside of her right nares for the past 2 months. There has been no injury or trauma. There has been no bleeding or purulent discharge. She spoke with her primary doctor who told her to come here for further evaluation.  The history is provided by the patient and the spouse.    Past Medical History  Diagnosis Date  . COPD (chronic obstructive pulmonary disease)   . Anxiety   . Shortness of breath   . Arthritis   . GERD (gastroesophageal reflux disease)   . Full dentures   . Insomnia    Past Surgical History  Procedure Laterality Date  . Arm surgery    . Hand surgery  1990    both rt/lt carpal tunnels  . Shoulder surgery      right and left-age [age]  . Orif ulnar / radial shaft fracture  1990    right-got infection-had total 10 surgeies 1990  . Orif ulnar fracture Left 05/23/2014    Procedure: OPEN REDUCTION INTERNAL FIXATION (ORIF) LEFT ULNAR FRACTURE;  Surgeon: Harvie JuniorJohn L Graves, MD;  Location: [facility];  Service: Orthopedics;  Laterality: Left;   Family History  Problem Relation Age of Onset  . Breast cancer Mother    History  Substance Use Topics  . Smoking status: Current Every Day Smoker -- 1.00 packs/day    Types: Cigarettes  . Smokeless tobacco: Never Used  . Alcohol Use: 1.8 oz/week    3 Glasses of wine per week     Comment: 6-pack a week   OB History    No data available     Review of Systems  All other systems reviewed and are negative.     Allergies   Review of patient's allergies indicates no known allergies.  Home Medications   Prior to Admission medications   Medication Sig Start Date End Date Taking? Authorizing Provider  albuterol-ipratropium (COMBIVENT) 18-103 MCG/ACT inhaler Inhale 2 puffs into the lungs every 6 (six) hours as needed for wheezing.    Historical Provider, MD  b complex vitamins tablet Take 1 tablet by mouth daily.    Historical Provider, MD  budesonide-formoterol (SYMBICORT) 80-4.5 MCG/ACT inhaler Inhale 2 puffs into the lungs 2 (two) times daily.    Historical Provider, MD  diphenhydramine-acetaminophen (TYLENOL PM) 25-500 MG TABS Take 1 tablet by mouth at bedtime as needed.    Historical Provider, MD  DULoxetine (CYMBALTA) 60 MG capsule Take 60 mg by mouth daily.    Historical Provider, MD  ibuprofen (ADVIL,MOTRIN) 200 MG tablet Take 400 mg by mouth every 6 (six) hours as needed for mild pain.    Historical Provider, MD  loratadine (CLARITIN) 10 MG tablet Take 10 mg by mouth daily.    Historical Provider, MD  magnesium citrate SOLN Take 1 Bottle by mouth once.    Historical Provider, MD  MAGNESIUM PO Take 1 tablet by mouth daily.    Historical Provider, MD  oxyCODONE-acetaminophen (PERCOCET/ROXICET) 5-325 MG per tablet Take 1-2 tablets by mouth every 6 (six) hours as  needed for severe pain. Patient not taking: Reported on 11/30/2014 05/23/14   Matthew Folks, PA-C  POTASSIUM PO Take 1 tablet by mouth daily.    Historical Provider, MD  sulfamethoxazole-trimethoprim (BACTRIM DS,SEPTRA DS) 800-160 MG per tablet Take 1 tablet by mouth every 12 (twelve) hours. 12/01/14   Marinda Elk, MD  tiotropium (SPIRIVA) 18 MCG inhalation capsule Place 18 mcg into inhaler and inhale daily.    Historical Provider, MD   BP 115/72 mmHg  Pulse 104  Temp(Src) 98.1 F (36.7 C) (Oral)  Resp 20  Ht 5' (1.524 m)  Wt 98 lb (44.453 kg)  BMI 19.14 kg/m2  SpO2 95% Physical Exam  Constitutional: She is oriented to person, place,  and time. She appears well-developed and well-nourished. No distress.  HENT:  Head: Normocephalic and atraumatic.  Mouth/Throat: Oropharynx is clear and moist.  There is no obvious growth or abnormality within the nasal mucosa that I can visualize.  Neck: Normal range of motion.  Neurological: She is alert and oriented to person, place, and time.  Skin: Skin is warm and dry. She is not diaphoretic.  Nursing note and vitals reviewed.   ED Course  Procedures (including critical care time) Labs Review Labs Reviewed - No data to display  Imaging Review No results found.   EKG Interpretation None      MDM   Final diagnoses:  None    I see no obvious abnormality within the nasal mucosa. She states she has the sensation of "a penny in her upper nose". I will treat her with antibiotics, pain meds, and will provide her with the follow-up information for your nose and throat. She may require a nasal scope to further evaluate.    Geoffery Lyons, MD 01/03/15 1134

## 2015-01-03 NOTE — ED Notes (Signed)
Pt to room 11 in w/c, reporting feeling sensation of "a bump" inside her right nares. Pt states this bump has been there since the beginning of November, she called her pcp about it today and was told to come to ed for eval. Pt states she has copd and this bump in her nose is making it more difficult to breathe.

## 2015-03-15 ENCOUNTER — Emergency Department (HOSPITAL_BASED_OUTPATIENT_CLINIC_OR_DEPARTMENT_OTHER): Payer: Self-pay

## 2015-03-15 ENCOUNTER — Emergency Department (HOSPITAL_BASED_OUTPATIENT_CLINIC_OR_DEPARTMENT_OTHER)
Admission: EM | Admit: 2015-03-15 | Discharge: 2015-03-16 | Disposition: A | Discharge status: Home / Self Care | Payer: Self-pay | Attending: Emergency Medicine | Admitting: Emergency Medicine

## 2015-03-15 ENCOUNTER — Encounter (HOSPITAL_BASED_OUTPATIENT_CLINIC_OR_DEPARTMENT_OTHER): Payer: Self-pay | Admitting: Emergency Medicine

## 2015-03-15 DIAGNOSIS — Y9389 Activity, other specified: Secondary | ICD-10-CM | POA: Insufficient documentation

## 2015-03-15 DIAGNOSIS — M1990 Unspecified osteoarthritis, unspecified site: Secondary | ICD-10-CM | POA: Insufficient documentation

## 2015-03-15 DIAGNOSIS — Z8669 Personal history of other diseases of the nervous system and sense organs: Secondary | ICD-10-CM | POA: Insufficient documentation

## 2015-03-15 DIAGNOSIS — Z720 Tobacco use: Secondary | ICD-10-CM | POA: Insufficient documentation

## 2015-03-15 DIAGNOSIS — Z23 Encounter for immunization: Secondary | ICD-10-CM | POA: Insufficient documentation

## 2015-03-15 DIAGNOSIS — F419 Anxiety disorder, unspecified: Secondary | ICD-10-CM | POA: Insufficient documentation

## 2015-03-15 DIAGNOSIS — Y998 Other external cause status: Secondary | ICD-10-CM | POA: Insufficient documentation

## 2015-03-15 DIAGNOSIS — IMO0002 Reserved for concepts with insufficient information to code with codable children: Secondary | ICD-10-CM

## 2015-03-15 DIAGNOSIS — Y9289 Other specified places as the place of occurrence of the external cause: Secondary | ICD-10-CM | POA: Insufficient documentation

## 2015-03-15 DIAGNOSIS — S01111D Laceration without foreign body of right eyelid and periocular area, subsequent encounter: Secondary | ICD-10-CM | POA: Insufficient documentation

## 2015-03-15 DIAGNOSIS — Z8719 Personal history of other diseases of the digestive system: Secondary | ICD-10-CM | POA: Insufficient documentation

## 2015-03-15 DIAGNOSIS — S51011A Laceration without foreign body of right elbow, initial encounter: Secondary | ICD-10-CM | POA: Insufficient documentation

## 2015-03-15 DIAGNOSIS — R52 Pain, unspecified: Secondary | ICD-10-CM

## 2015-03-15 DIAGNOSIS — J449 Chronic obstructive pulmonary disease, unspecified: Secondary | ICD-10-CM | POA: Insufficient documentation

## 2015-03-15 DIAGNOSIS — W1830XA Fall on same level, unspecified, initial encounter: Secondary | ICD-10-CM | POA: Insufficient documentation

## 2015-03-15 DIAGNOSIS — Z792 Long term (current) use of antibiotics: Secondary | ICD-10-CM | POA: Insufficient documentation

## 2015-03-15 MED ORDER — TETANUS-DIPHTH-ACELL PERTUSSIS 5-2.5-18.5 LF-MCG/0.5 IM SUSP: 0.5000 mL | Freq: Once | INTRAMUSCULAR | Status: DC

## 2015-03-15 NOTE — ED Provider Notes (Signed)
CSN: [id number]     Arrival date & time 03/15/15  2158 History  This chart was scribed for Shakerria Parran, MD by Evon Slack, ED Scribe. This patient was seen in room MH06/MH06 and the patient's care was started at 11:02 PM.    Chief Complaint  Patient presents with  . Extremity Laceration   Patient is a 54 y.o. female presenting with skin laceration. The history is provided by the patient and the spouse. No language interpreter was used.  Laceration Location:  Shoulder/arm Shoulder/arm laceration location:  R elbow Depth:  Through dermis Quality: straight   Bleeding: controlled   Laceration mechanism:  Fall Pain details:    Quality:  Aching   Severity:  Mild   Timing:  Constant   Progression:  Unchanged Relieved by:  Nothing Worsened by:  Nothing tried Ineffective treatments:  None tried Tetanus status:  Unknown  HPI Comments: Rhonda Mitchell is a 54 y.o. female who presents to the Emergency Department complaining of fall from standing positing onset tonight PTA. Pt presents with new right arm laceration. Pt states that she fell on to a candle holder. Pt presents with several lacerations on her face from previous fall a week ago while feeding horse.  All facial lacerations are from the trauma a week ago. Pt states that she has consumed about 1 pint of alcohol today. Pt states that she did hit her head but denies LOC. Pt denies any other medications PTA. Pt doesn't report any other symptoms.   Past Medical History  Diagnosis Date  . COPD (chronic obstructive pulmonary disease)   . Anxiety   . Shortness of breath   . Arthritis   . GERD (gastroesophageal reflux disease)   . Full dentures   . Insomnia    Past Surgical History  Procedure Laterality Date  . Arm surgery    . Hand surgery  1990    both rt/lt carpal tunnels  . Shoulder surgery      right and left-age [age]  . Orif ulnar / radial shaft fracture  1990    right-got infection-had total 10 surgeies 1990  . Orif  ulnar fracture Left 05/23/2014    Procedure: OPEN REDUCTION INTERNAL FIXATION (ORIF) LEFT ULNAR FRACTURE;  Surgeon: Harvie Junior, MD;  Location: [facility];  Service: Orthopedics;  Laterality: Left;   Family History  Problem Relation Age of Onset  . Breast cancer Mother    History  Substance Use Topics  . Smoking status: Current Every Day Smoker -- 1.00 packs/day    Types: Cigarettes  . Smokeless tobacco: Never Used  . Alcohol Use: 1.8 oz/week    3 Glasses of wine per week     Comment: 6-pack a week   OB History    No data available     Review of Systems  Skin: Positive for wound.  All other systems reviewed and are negative.    Allergies  Review of patient's allergies indicates no known allergies.  Home Medications   Prior to Admission medications   Medication Sig Start Date End Date Taking? Authorizing Provider  albuterol-ipratropium (COMBIVENT) 18-103 MCG/ACT inhaler Inhale 2 puffs into the lungs every 6 (six) hours as needed for wheezing.    Historical Provider, MD  amoxicillin (AMOXIL) 500 MG capsule Take 1 capsule (500 mg total) by mouth 3 (three) times daily. 01/03/15   Geoffery Lyons, MD  b complex vitamins tablet Take 1 tablet by mouth daily.    Historical Provider, MD  budesonide-formoterol (SYMBICORT) 80-4.5 MCG/ACT inhaler Inhale 2 puffs into the lungs 2 (two) times daily.    Historical Provider, MD  diphenhydramine-acetaminophen (TYLENOL PM) 25-500 MG TABS Take 1 tablet by mouth at bedtime as needed.    Historical Provider, MD  DULoxetine (CYMBALTA) 60 MG capsule Take 60 mg by mouth daily.    Historical Provider, MD  HYDROcodone-acetaminophen (NORCO) 5-325 MG per tablet Take 1-2 tablets by mouth every 6 (six) hours as needed. 01/03/15   Geoffery Lyonsouglas Delo, MD  ibuprofen (ADVIL,MOTRIN) 200 MG tablet Take 400 mg by mouth every 6 (six) hours as needed for mild pain.    Historical Provider, MD  loratadine (CLARITIN) 10 MG tablet Take 10 mg by mouth daily.     Historical Provider, MD  magnesium citrate SOLN Take 1 Bottle by mouth once.    Historical Provider, MD  MAGNESIUM PO Take 1 tablet by mouth daily.    Historical Provider, MD  oxyCODONE-acetaminophen (PERCOCET/ROXICET) 5-325 MG per tablet Take 1-2 tablets by mouth every 6 (six) hours as needed for severe pain. Patient not taking: Reported on 11/30/2014 05/23/14   Marshia LyJames Bethune, PA-C  POTASSIUM PO Take 1 tablet by mouth daily.    Historical Provider, MD  sulfamethoxazole-trimethoprim (BACTRIM DS,SEPTRA DS) 800-160 MG per tablet Take 1 tablet by mouth every 12 (twelve) hours. 12/01/14   Marinda ElkAbraham Feliz Ortiz, MD  tiotropium (SPIRIVA) 18 MCG inhalation capsule Place 18 mcg into inhaler and inhale daily.    Historical Provider, MD   BP 127/80 mmHg  Pulse 79  Temp(Src) 97.8 F (36.6 C) (Oral)  Resp 18  Ht 5' (1.524 m)  Wt 100 lb (45.36 kg)  BMI 19.53 kg/m2  SpO2 97%   Physical Exam  Constitutional: She is oriented to person, place, and time. She appears well-developed and well-nourished. No distress.  HENT:  Head: Normocephalic and atraumatic. Head is without raccoon's eyes and without Battle's sign.    Right Ear: No mastoid tenderness. No hemotympanum.  Left Ear: No mastoid tenderness. No hemotympanum.  Mouth/Throat: Oropharynx is clear and moist.  multiple superficial lacerations on face that are not new. large vertical laceration next to right eye brow also not new.   Eyes: EOM are normal. Pupils are equal, round, and reactive to light. Right conjunctiva has a hemorrhage.  Neck: Normal range of motion. Neck supple. No tracheal deviation present.  Cardiovascular: Normal rate, regular rhythm, normal heart sounds and intact distal pulses.   Pulmonary/Chest: Effort normal and breath sounds normal. No respiratory distress. She has no wheezes. She has no rales.  Abdominal: Soft. Bowel sounds are normal. There is no tenderness. There is no rebound and no guarding.  Musculoskeletal: Normal range  of motion.  Neurological: She is alert and oriented to person, place, and time.  Skin: Skin is warm and dry.  2 inch lac on next to right elbow contaminated with animal hair.  Psychiatric: She has a normal mood and affect. Her behavior is normal.  Nursing note and vitals reviewed.   ED Course  Procedures (including critical care time) DIAGNOSTIC STUDIES: Oxygen Saturation is 97% on RA, normal by my interpretation.    COORDINATION OF CARE: 11:10 PM-Discussed treatment plan with family at bedside and family agreed to plan.      Labs Review Labs Reviewed - No data to display  Imaging Review Ct Head Wo Contrast  03/15/2015   CLINICAL DATA:  Fall right eyebrow laceration  EXAM: CT HEAD WITHOUT CONTRAST  TECHNIQUE: Contiguous axial images were obtained  from the base of the skull through the vertex without intravenous contrast.  COMPARISON:  04/23/2014  FINDINGS: No skull fracture is noted. There is moderate cerebral atrophy. Bilateral symmetrical mild prominent of extra-axial space probable due to atrophy. There is mild scalp swelling in right frontal region see axial image 15. Mild right periorbital soft tissue swelling and subcutaneous stranding. Question laceration/skin irregularity head right lateral periorbital.  No intracranial hemorrhage, mass effect or midline shift. No hydrocephalus. The gray and white-matter differentiation is preserved. No mass lesion is noted on this unenhanced scan.  IMPRESSION: No acute intracranial abnormality. Moderate cerebral atrophy. Mild scalp swelling in right frontal region. There is soft tissue swelling subcutaneous stranding and skin irregularity in right periorbital region. Clinical correlation is necessary.   Electronically Signed   By: Natasha Mead M.D.   On: 03/15/2015 22:41     EKG Interpretation None      MDM   Final diagnoses:  None    LACERATION REPAIR Performed by: Jasmine Awe Authorized by: Jasmine Awe Consent:  Verbal consent obtained. Risks and benefits: risks, benefits and alternatives were discussed Consent given by: patient Patient identity confirmed: provided demographic data Prepped and Draped in normal sterile fashion Wound explored  Laceration Location right elbow  Laceration Length: 4.5  cm  No Foreign Bodies seen or palpated  Anesthesia: topical  Local anesthetic: LET  Irrigation method: syringe Amount of cleaning: extensive  Skin closure: staples  Number of sutures: 7  Technique: staples  Patient tolerance: Patient tolerated the procedure well with no immediate complications.    Removal at urgent care in 12 days.  Stop all alcohol usage   I personally performed the services described in this documentation, which was scribed in my presence. The recorded information has been reviewed and is accurate.       Cy Blamer, MD 03/16/15 [phone]

## 2015-03-15 NOTE — ED Notes (Addendum)
Pt reports to me that she fell while outside when feeding her horses.  Noted to have a large laceration to her (R) elbow and to her (R) eyebrow-bleeding controlled to both.  (R) eye is bloodshot red, pupils equal, very sluggish to react.  Pt reports drinking a pint of liquor a day.

## 2015-03-15 NOTE — ED Notes (Addendum)
Patient states that she fell at home onto a lamp. Patient smells like ETOH. Multiple sites that have lacerations, some old and some new. The one that the person is concerned about today is the right elbow

## 2015-03-16 ENCOUNTER — Encounter (HOSPITAL_BASED_OUTPATIENT_CLINIC_OR_DEPARTMENT_OTHER): Payer: Self-pay | Admitting: Emergency Medicine

## 2015-03-16 MED ORDER — LIDOCAINE-EPINEPHRINE-TETRACAINE (LET) SOLUTION
3.0000 mL | Freq: Once | NASAL | Status: AC
  Administered 2015-03-16: 6 mL via TOPICAL

## 2015-03-16 MED ORDER — LIDOCAINE-EPINEPHRINE-TETRACAINE (LET) SOLUTION
NASAL | Status: AC
  Administered 2015-03-16: 6 mL via TOPICAL
  Filled 2015-03-16: qty 6

## 2015-03-18 ENCOUNTER — Encounter (HOSPITAL_COMMUNITY): Payer: Self-pay | Admitting: *Deleted

## 2015-03-18 ENCOUNTER — Inpatient Hospital Stay (HOSPITAL_COMMUNITY)
Admission: EM | Admit: 2015-03-18 | Discharge: 2015-03-25 | DRG: 190 | Discharge status: Against Medical Advice | Payer: Self-pay | Attending: Internal Medicine | Admitting: Internal Medicine

## 2015-03-18 ENCOUNTER — Emergency Department (HOSPITAL_COMMUNITY): Payer: Self-pay

## 2015-03-18 DIAGNOSIS — D539 Nutritional anemia, unspecified: Secondary | ICD-10-CM | POA: Diagnosis present

## 2015-03-18 DIAGNOSIS — G4700 Insomnia, unspecified: Secondary | ICD-10-CM | POA: Diagnosis present

## 2015-03-18 DIAGNOSIS — Z23 Encounter for immunization: Secondary | ICD-10-CM

## 2015-03-18 DIAGNOSIS — M1990 Unspecified osteoarthritis, unspecified site: Secondary | ICD-10-CM | POA: Diagnosis present

## 2015-03-18 DIAGNOSIS — F1020 Alcohol dependence, uncomplicated: Secondary | ICD-10-CM | POA: Diagnosis present

## 2015-03-18 DIAGNOSIS — E871 Hypo-osmolality and hyponatremia: Secondary | ICD-10-CM

## 2015-03-18 DIAGNOSIS — R740 Nonspecific elevation of levels of transaminase and lactic acid dehydrogenase [LDH]: Secondary | ICD-10-CM | POA: Diagnosis present

## 2015-03-18 DIAGNOSIS — Y906 Blood alcohol level of 120-199 mg/100 ml: Secondary | ICD-10-CM | POA: Diagnosis present

## 2015-03-18 DIAGNOSIS — M25551 Pain in right hip: Secondary | ICD-10-CM | POA: Diagnosis present

## 2015-03-18 DIAGNOSIS — K219 Gastro-esophageal reflux disease without esophagitis: Secondary | ICD-10-CM | POA: Diagnosis present

## 2015-03-18 DIAGNOSIS — F10239 Alcohol dependence with withdrawal, unspecified: Secondary | ICD-10-CM

## 2015-03-18 DIAGNOSIS — F419 Anxiety disorder, unspecified: Secondary | ICD-10-CM | POA: Diagnosis present

## 2015-03-18 DIAGNOSIS — E876 Hypokalemia: Secondary | ICD-10-CM | POA: Diagnosis not present

## 2015-03-18 DIAGNOSIS — R739 Hyperglycemia, unspecified: Secondary | ICD-10-CM

## 2015-03-18 DIAGNOSIS — W19XXXA Unspecified fall, initial encounter: Secondary | ICD-10-CM | POA: Diagnosis present

## 2015-03-18 DIAGNOSIS — Y92230 Patient room in hospital as the place of occurrence of the external cause: Secondary | ICD-10-CM

## 2015-03-18 DIAGNOSIS — L03113 Cellulitis of right upper limb: Secondary | ICD-10-CM | POA: Diagnosis present

## 2015-03-18 DIAGNOSIS — F1026 Alcohol dependence with alcohol-induced persisting amnestic disorder: Secondary | ICD-10-CM | POA: Diagnosis present

## 2015-03-18 DIAGNOSIS — F10230 Alcohol dependence with withdrawal, uncomplicated: Secondary | ICD-10-CM | POA: Diagnosis present

## 2015-03-18 DIAGNOSIS — D696 Thrombocytopenia, unspecified: Secondary | ICD-10-CM | POA: Diagnosis present

## 2015-03-18 DIAGNOSIS — J441 Chronic obstructive pulmonary disease with (acute) exacerbation: Principal | ICD-10-CM

## 2015-03-18 DIAGNOSIS — G9340 Encephalopathy, unspecified: Secondary | ICD-10-CM | POA: Diagnosis present

## 2015-03-18 DIAGNOSIS — R0602 Shortness of breath: Secondary | ICD-10-CM

## 2015-03-18 DIAGNOSIS — F17210 Nicotine dependence, cigarettes, uncomplicated: Secondary | ICD-10-CM | POA: Diagnosis present

## 2015-03-18 HISTORY — DX: Alcohol abuse, uncomplicated: F10.10

## 2015-03-18 LAB — CBC
HCT: 35.2 % — ABNORMAL LOW (ref 36.0–46.0)
Hemoglobin: 12.3 g/dL (ref 12.0–15.0)
MCH: 36.2 pg — ABNORMAL HIGH (ref 26.0–34.0)
MCHC: 34.9 g/dL (ref 30.0–36.0)
MCV: 103.5 fL — ABNORMAL HIGH (ref 78.0–100.0)
PLATELETS: 135 10*3/uL — AB (ref 150–400)
RBC: 3.4 MIL/uL — ABNORMAL LOW (ref 3.87–5.11)
RDW: 12.9 % (ref 11.5–15.5)
WBC: 9.2 10*3/uL (ref 4.0–10.5)

## 2015-03-18 LAB — COMPREHENSIVE METABOLIC PANEL
ALT: 49 U/L — ABNORMAL HIGH (ref 0–35)
AST: 120 U/L — AB (ref 0–37)
Albumin: 3.9 g/dL (ref 3.5–5.2)
Alkaline Phosphatase: 92 U/L (ref 39–117)
Anion gap: 14 (ref 5–15)
CALCIUM: 8.3 mg/dL — AB (ref 8.4–10.5)
CO2: 24 mmol/L (ref 19–32)
CREATININE: 0.67 mg/dL (ref 0.50–1.10)
Chloride: 87 mmol/L — ABNORMAL LOW (ref 96–112)
Glucose, Bld: 130 mg/dL — ABNORMAL HIGH (ref 70–99)
Potassium: 3.8 mmol/L (ref 3.5–5.1)
Sodium: 125 mmol/L — ABNORMAL LOW (ref 135–145)
TOTAL PROTEIN: 7.2 g/dL (ref 6.0–8.3)
Total Bilirubin: 1.1 mg/dL (ref 0.3–1.2)

## 2015-03-18 LAB — I-STAT TROPONIN, ED: TROPONIN I, POC: 0.02 ng/mL (ref 0.00–0.08)

## 2015-03-18 LAB — I-STAT ARTERIAL BLOOD GAS, ED
Acid-base deficit: 1 mmol/L (ref 0.0–2.0)
Bicarbonate: 22.2 mEq/L (ref 20.0–24.0)
O2 Saturation: 100 %
PH ART: 7.449 (ref 7.350–7.450)
Patient temperature: 98.6
TCO2: 23 mmol/L (ref 0–100)
pCO2 arterial: 32 mmHg — ABNORMAL LOW (ref 35.0–45.0)
pO2, Arterial: 266 mmHg — ABNORMAL HIGH (ref 80.0–100.0)

## 2015-03-18 LAB — I-STAT CHEM 8, ED
BUN: 5 mg/dL — ABNORMAL LOW (ref 6–23)
CREATININE: 0.8 mg/dL (ref 0.50–1.10)
Calcium, Ion: 0.93 mmol/L — ABNORMAL LOW (ref 1.12–1.23)
Chloride: 87 mmol/L — ABNORMAL LOW (ref 96–112)
GLUCOSE: 128 mg/dL — AB (ref 70–99)
HCT: 49 % — ABNORMAL HIGH (ref 36.0–46.0)
HEMOGLOBIN: 16.7 g/dL — AB (ref 12.0–15.0)
Potassium: 3.8 mmol/L (ref 3.5–5.1)
SODIUM: 125 mmol/L — AB (ref 135–145)
TCO2: 21 mmol/L (ref 0–100)

## 2015-03-18 LAB — BRAIN NATRIURETIC PEPTIDE: B Natriuretic Peptide: 234 pg/mL — ABNORMAL HIGH (ref 0.0–100.0)

## 2015-03-18 MED ORDER — LORAZEPAM 2 MG/ML IJ SOLN
0.5000 mg | Freq: Once | INTRAMUSCULAR | Status: AC
  Administered 2015-03-18: 0.5 mg via INTRAVENOUS
  Filled 2015-03-18: qty 1

## 2015-03-18 MED ORDER — LEVOFLOXACIN IN D5W 500 MG/100ML IV SOLN
500.0000 mg | INTRAVENOUS | Status: DC
  Administered 2015-03-19 – 2015-03-21 (×3): 500 mg via INTRAVENOUS
  Filled 2015-03-18 (×5): qty 100

## 2015-03-18 MED ORDER — TIOTROPIUM BROMIDE MONOHYDRATE 18 MCG IN CAPS
18.0000 ug | ORAL_CAPSULE | Freq: Every day | RESPIRATORY_TRACT | Status: DC
  Administered 2015-03-19: 18 ug via RESPIRATORY_TRACT
  Filled 2015-03-18: qty 5

## 2015-03-18 MED ORDER — ALBUTEROL SULFATE (2.5 MG/3ML) 0.083% IN NEBU: 2.5000 mg | INHALATION_SOLUTION | Freq: Four times a day (QID) | RESPIRATORY_TRACT | Status: DC | PRN

## 2015-03-18 MED ORDER — METHYLPREDNISOLONE SODIUM SUCC 125 MG IJ SOLR
80.0000 mg | Freq: Three times a day (TID) | INTRAMUSCULAR | Status: DC
  Administered 2015-03-19: 80 mg via INTRAVENOUS
  Filled 2015-03-18: qty 1.28
  Filled 2015-03-18: qty 2
  Filled 2015-03-18 (×2): qty 1.28

## 2015-03-18 MED ORDER — ALBUTEROL SULFATE (2.5 MG/3ML) 0.083% IN NEBU
2.5000 mg | INHALATION_SOLUTION | Freq: Four times a day (QID) | RESPIRATORY_TRACT | Status: DC
  Administered 2015-03-19 (×2): 2.5 mg via RESPIRATORY_TRACT
  Filled 2015-03-18 (×2): qty 3

## 2015-03-18 NOTE — ED Notes (Signed)
Phlebotomy has successfully obtained lab specimens

## 2015-03-18 NOTE — H&P (Signed)
Rhonda Mitchell is an 54 y.o. female.  Pcp: unassigned    Chief Complaint: dyspnea HPI: 54 yo female with hx of Copd , tobacco use, etoh abuse, apparently c/o dyspnea x2 days, along with cough with yellow sputum.  Breathing was worse today and pt was brought in by EMS.  Pt received solumedrol, epi pen, bipap en route.  CXR in ED negative for acute process.  Pt was tremulous,   Pt will be admitted for Copd exacerbation.   Past Medical History  Diagnosis Date  . COPD (chronic obstructive pulmonary disease)   . Anxiety   . Shortness of breath   . Arthritis   . GERD (gastroesophageal reflux disease)   . Full dentures   . Insomnia   . Alcohol abuse     Past Surgical History  Procedure Laterality Date  . Arm surgery    . Hand surgery  1990    both rt/lt carpal tunnels  . Shoulder surgery      right and left-age [age]  . Orif ulnar / radial shaft fracture  1990    right-got infection-had total 10 surgeies 1990  . Orif ulnar fracture Left 05/23/2014    Procedure: OPEN REDUCTION INTERNAL FIXATION (ORIF) LEFT ULNAR FRACTURE;  Surgeon: Alta Corning, MD;  Location: [facility];  Service: Orthopedics;  Laterality: Left;    Family History  Problem Relation Age of Onset  . Breast cancer Mother    Social History:  reports that she has been smoking Cigarettes.  She has a 20 pack-year smoking history. She has never used smokeless tobacco. She reports that she drinks about 1.8 oz of alcohol per week. She reports that she does not use illicit drugs.  Allergies: No Known Allergies   (Not in a hospital admission)  Results for orders placed or performed during the hospital encounter of 03/18/15 (from the past 48 hour(s))  I-Stat arterial blood gas, ED     Status: Abnormal   Collection Time: 03/18/15  8:19 PM  Result Value Ref Range   pH, Arterial 7.449 7.350 - 7.450   pCO2 arterial 32.0 (L) 35.0 - 45.0 mmHg   pO2, Arterial 266.0 (H) 80.0 - 100.0 mmHg   Bicarbonate 22.2 20.0 -  24.0 mEq/L   TCO2 23 0 - 100 mmol/L   O2 Saturation 100.0 %   Acid-base deficit 1.0 0.0 - 2.0 mmol/L   Patient temperature 98.6 F    Collection site RADIAL, ALLEN'S TEST ACCEPTABLE    Drawn by RT    Sample type ARTERIAL   CBC     (if pt has PMH of COPD)     Status: Abnormal   Collection Time: 03/18/15  9:09 PM  Result Value Ref Range   WBC 9.2 4.0 - 10.5 K/uL   RBC 3.40 (L) 3.87 - 5.11 MIL/uL   Hemoglobin 12.3 12.0 - 15.0 g/dL   HCT 35.2 (L) 36.0 - 46.0 %   MCV 103.5 (H) 78.0 - 100.0 fL   MCH 36.2 (H) 26.0 - 34.0 pg   MCHC 34.9 30.0 - 36.0 g/dL   RDW 12.9 11.5 - 15.5 %   Platelets 135 (L) 150 - 400 K/uL  Comprehensive metabolic panel     Status: Abnormal   Collection Time: 03/18/15  9:09 PM  Result Value Ref Range   Sodium 125 (L) 135 - 145 mmol/L   Potassium 3.8 3.5 - 5.1 mmol/L   Chloride 87 (L) 96 - 112 mmol/L   CO2 24 19 -  32 mmol/L   Glucose, Bld 130 (H) 70 - 99 mg/dL   BUN <5 (L) 6 - 23 mg/dL    Comment: REPEATED TO VERIFY   Creatinine, Ser 0.67 0.50 - 1.10 mg/dL   Calcium 8.3 (L) 8.4 - 10.5 mg/dL   Total Protein 7.2 6.0 - 8.3 g/dL   Albumin 3.9 3.5 - 5.2 g/dL   AST 120 (H) 0 - 37 U/L   ALT 49 (H) 0 - 35 U/L   Alkaline Phosphatase 92 39 - 117 U/L   Total Bilirubin 1.1 0.3 - 1.2 mg/dL   GFR calc non Af Amer >90 >90 mL/min   GFR calc Af Amer >90 >90 mL/min    Comment: (NOTE) The eGFR has been calculated using the CKD EPI equation. This calculation has not been validated in all clinical situations. eGFR's persistently <90 mL/min signify possible Chronic Kidney Disease.    Anion gap 14 5 - 15  Brain natriuretic peptide     Status: Abnormal   Collection Time: 03/18/15  9:09 PM  Result Value Ref Range   B Natriuretic Peptide 234.0 (H) 0.0 - 100.0 pg/mL  I-stat troponin, ED (if patient has history of COPD)     Status: None   Collection Time: 03/18/15  9:18 PM  Result Value Ref Range   Troponin i, poc 0.02 0.00 - 0.08 ng/mL   Comment 3            Comment: Due to  the release kinetics of cTnI, a negative result within the first hours of the onset of symptoms does not rule out myocardial infarction with certainty. If myocardial infarction is still suspected, repeat the test at appropriate intervals.   I-Stat Chem 8, ED     Status: Abnormal   Collection Time: 03/18/15 10:02 PM  Result Value Ref Range   Sodium 125 (L) 135 - 145 mmol/L   Potassium 3.8 3.5 - 5.1 mmol/L   Chloride 87 (L) 96 - 112 mmol/L   BUN 5 (L) 6 - 23 mg/dL   Creatinine, Ser 0.80 0.50 - 1.10 mg/dL   Glucose, Bld 128 (H) 70 - 99 mg/dL   Calcium, Ion 0.93 (L) 1.12 - 1.23 mmol/L   TCO2 21 0 - 100 mmol/L   Hemoglobin 16.7 (H) 12.0 - 15.0 g/dL   HCT 49.0 (H) 36.0 - 46.0 %   Dg Chest Portable 1 View  03/18/2015   CLINICAL DATA:  Shortness of breath.  EXAM: PORTABLE CHEST - 1 VIEW  COMPARISON:  12/07/2014  FINDINGS: Artifact overlies the chest. Heart size is normal. Mediastinal shadows are normal. The lungs are clear. No effusions. No bony abnormalities.  IMPRESSION: No active disease.   Electronically Signed   By: Nelson Chimes M.D.   On: 03/18/2015 21:04    Review of Systems  Constitutional: Negative.   HENT: Negative.   Eyes: Negative for blurred vision, double vision, photophobia, pain, discharge and redness.  Respiratory: Positive for cough, sputum production, shortness of breath and wheezing.   Cardiovascular: Negative for chest pain, palpitations, orthopnea, claudication, leg swelling and PND.  Gastrointestinal: Negative.   Genitourinary: Negative.   Musculoskeletal: Negative.   Skin: Negative.   Neurological: Negative.   Endo/Heme/Allergies: Negative.   Psychiatric/Behavioral: Negative.     Blood pressure 140/82, pulse 85, temperature 99 F (37.2 C), temperature source Oral, resp. rate 23, SpO2 95 %. Physical Exam  Constitutional: She is oriented to person, place, and time. She appears well-developed and well-nourished.  HENT:  Head:  Normocephalic and atraumatic.   Mouth/Throat: No oropharyngeal exudate.  Eyes: Conjunctivae and EOM are normal. Pupils are equal, round, and reactive to light. No scleral icterus.  Neck: Normal range of motion. Neck supple. No JVD present. No tracheal deviation present. No thyromegaly present.  Cardiovascular: Normal rate and regular rhythm.  Exam reveals no gallop and no friction rub.   No murmur heard. Respiratory: She is in respiratory distress. She has wheezes. She has no rales. She exhibits tenderness.  GI: Soft. Bowel sounds are normal. She exhibits no distension. There is no tenderness. There is no rebound and no guarding.  Musculoskeletal: Normal range of motion. She exhibits no edema or tenderness.  Lymphadenopathy:    She has no cervical adenopathy.  Neurological: She is alert and oriented to person, place, and time. She has normal reflexes. She displays normal reflexes. No cranial nerve deficit. She exhibits normal muscle tone. Coordination normal.  Shakiness, tremor of arms, bil  Skin: Skin is warm and dry. No rash noted. No erythema. No pallor.  Psychiatric: She has a normal mood and affect. Her behavior is normal. Judgment and thought content normal.     Assessment/Plan Copd exacerbation Solumedrol 58m iv q8h Spiriva 1puff qday Albuterol neb 1 neb po q6h and q6h prn levaquin 5046miv qday  Hyponatremia Check serum osm, tsh, cortisol, urine sodium, urine osm Hydrate gently with ns iv  Tobacco use counselled for 49m74mtes on smoking cessation  Hyperglycemia Check hga1c  Etoh use: ciwa protocol  DVT prophylaxis: scd, lovenox  KIMJani Gravel28/2016, 11:34 PM

## 2015-03-18 NOTE — ED Notes (Addendum)
Pt assisted to bedside commode. Very weak in assisting to commode; significantly short of breath on exertion. sats maintained 97%. No urine output at this time. Given Coke and Mal[place]urkey sandwich

## 2015-03-18 NOTE — ED Notes (Signed)
Patient is refusing to allow phlebotomy to draw labs until her ativan is given.

## 2015-03-18 NOTE — ED Notes (Signed)
Phlebotomy called.  Attempt at 2nd IV and phlebotomy draw failed twice by this nurse and another once.

## 2015-03-18 NOTE — ED Notes (Signed)
Dr. Kim at bedside   

## 2015-03-18 NOTE — ED Notes (Addendum)
Pt to ED from home via [facility] c/o respiratory distress. Reports shortness of breath x several days. Initial says 80%. EMS gave 125mg  Solumedrol; 2g Mg; 1.5 ml Epi IM; and breathing treatments x 3. Hx of alcoholism, with last drink today. Pt on CPAP for EMS

## 2015-03-19 DIAGNOSIS — F10231 Alcohol dependence with withdrawal delirium: Secondary | ICD-10-CM

## 2015-03-19 DIAGNOSIS — F1029 Alcohol dependence with unspecified alcohol-induced disorder: Secondary | ICD-10-CM

## 2015-03-19 LAB — COMPREHENSIVE METABOLIC PANEL
ALT: 46 U/L — AB (ref 0–35)
ANION GAP: 13 (ref 5–15)
AST: 100 U/L — ABNORMAL HIGH (ref 0–37)
Albumin: 3.7 g/dL (ref 3.5–5.2)
Alkaline Phosphatase: 86 U/L (ref 39–117)
BILIRUBIN TOTAL: 1.2 mg/dL (ref 0.3–1.2)
BUN: 9 mg/dL (ref 6–23)
CO2: 23 mmol/L (ref 19–32)
Calcium: 8.5 mg/dL (ref 8.4–10.5)
Chloride: 89 mmol/L — ABNORMAL LOW (ref 96–112)
Creatinine, Ser: 0.75 mg/dL (ref 0.50–1.10)
Glucose, Bld: 160 mg/dL — ABNORMAL HIGH (ref 70–99)
Potassium: 4.5 mmol/L (ref 3.5–5.1)
Sodium: 125 mmol/L — ABNORMAL LOW (ref 135–145)
TOTAL PROTEIN: 7.5 g/dL (ref 6.0–8.3)

## 2015-03-19 LAB — VITAMIN B12: VITAMIN B 12: 567 pg/mL (ref 211–911)

## 2015-03-19 LAB — HEPATITIS PANEL, ACUTE
HCV AB: REACTIVE — AB
HEP B S AG: NEGATIVE
Hep A IgM: NONREACTIVE
Hep B C IgM: NONREACTIVE

## 2015-03-19 LAB — IRON AND TIBC
Iron: 280 ug/dL — ABNORMAL HIGH (ref 42–145)
SATURATION RATIOS: 73 % — AB (ref 20–55)
TIBC: 386 ug/dL (ref 250–470)
UIBC: 106 ug/dL — ABNORMAL LOW (ref 125–400)

## 2015-03-19 LAB — OSMOLALITY, URINE: OSMOLALITY UR: 581 mosm/kg (ref 390–1090)

## 2015-03-19 LAB — CBC
HCT: 33.6 % — ABNORMAL LOW (ref 36.0–46.0)
HEMOGLOBIN: 11.6 g/dL — AB (ref 12.0–15.0)
MCH: 36.6 pg — ABNORMAL HIGH (ref 26.0–34.0)
MCHC: 34.5 g/dL (ref 30.0–36.0)
MCV: 106 fL — ABNORMAL HIGH (ref 78.0–100.0)
Platelets: 130 10*3/uL — ABNORMAL LOW (ref 150–400)
RBC: 3.17 MIL/uL — ABNORMAL LOW (ref 3.87–5.11)
RDW: 13.1 % (ref 11.5–15.5)
WBC: 4.2 10*3/uL (ref 4.0–10.5)

## 2015-03-19 LAB — FERRITIN: Ferritin: 331 ng/mL — ABNORMAL HIGH (ref 10–291)

## 2015-03-19 LAB — OSMOLALITY: Osmolality: 273 mOsm/kg — ABNORMAL LOW (ref 275–300)

## 2015-03-19 LAB — CORTISOL: Cortisol, Plasma: 30.5 ug/dL

## 2015-03-19 LAB — TSH: TSH: 1.171 u[IU]/mL (ref 0.350–4.500)

## 2015-03-19 LAB — SODIUM, URINE, RANDOM: SODIUM UR: 89 mmol/L

## 2015-03-19 LAB — MRSA PCR SCREENING: MRSA BY PCR: POSITIVE — AB

## 2015-03-19 LAB — ETHANOL: Alcohol, Ethyl (B): 139 mg/dL — ABNORMAL HIGH (ref 0–9)

## 2015-03-19 LAB — FOLATE: Folate: 17.4 ng/mL

## 2015-03-19 MED ORDER — KCL IN DEXTROSE-NACL 20-5-0.45 MEQ/L-%-% IV SOLN
INTRAVENOUS | Status: DC
  Administered 2015-03-19: 16:00:00 via INTRAVENOUS
  Filled 2015-03-19 (×2): qty 1000

## 2015-03-19 MED ORDER — ENOXAPARIN SODIUM 40 MG/0.4ML ~~LOC~~ SOLN
40.0000 mg | SUBCUTANEOUS | Status: DC
  Filled 2015-03-19: qty 0.4

## 2015-03-19 MED ORDER — LORAZEPAM 2 MG/ML IJ SOLN
2.0000 mg | Freq: Once | INTRAMUSCULAR | Status: AC
  Administered 2015-03-19: 2 mg via INTRAMUSCULAR

## 2015-03-19 MED ORDER — SODIUM CHLORIDE 0.9 % IV SOLN
INTRAVENOUS | Status: DC
  Administered 2015-03-19: 02:00:00 via INTRAVENOUS

## 2015-03-19 MED ORDER — SODIUM CHLORIDE 0.9 % IJ SOLN
10.0000 mL | Freq: Two times a day (BID) | INTRAMUSCULAR | Status: DC
  Administered 2015-03-19 – 2015-03-20 (×4): 10 mL

## 2015-03-19 MED ORDER — BUDESONIDE 0.5 MG/2ML IN SUSP
0.5000 mg | Freq: Two times a day (BID) | RESPIRATORY_TRACT | Status: DC
Start: 2015-03-19 — End: 2015-03-25
  Administered 2015-03-20 – 2015-03-25 (×10): 0.5 mg via RESPIRATORY_TRACT
  Filled 2015-03-19 (×14): qty 2

## 2015-03-19 MED ORDER — SODIUM CHLORIDE 0.9 % IV SOLN
INTRAVENOUS | Status: DC
  Administered 2015-03-19 – 2015-03-21 (×3): via INTRAVENOUS

## 2015-03-19 MED ORDER — LORAZEPAM 1 MG PO TABS: 1.0000 mg | ORAL_TABLET | Freq: Four times a day (QID) | ORAL | Status: DC | PRN

## 2015-03-19 MED ORDER — LORAZEPAM 2 MG/ML IJ SOLN: 0.0000 mg | INTRAMUSCULAR | Status: DC

## 2015-03-19 MED ORDER — LORAZEPAM 2 MG/ML IJ SOLN
1.0000 mg | Freq: Four times a day (QID) | INTRAMUSCULAR | Status: DC | PRN
  Administered 2015-03-19: 1 mg via INTRAVENOUS
  Filled 2015-03-19 (×2): qty 1

## 2015-03-19 MED ORDER — DEXMEDETOMIDINE HCL IN NACL 200 MCG/50ML IV SOLN
0.2000 ug/kg/h | INTRAVENOUS | Status: AC
  Administered 2015-03-19 – 2015-03-20 (×2): 0.6 ug/kg/h via INTRAVENOUS
  Administered 2015-03-20: 0.2 ug/kg/h via INTRAVENOUS
  Filled 2015-03-19: qty 100
  Filled 2015-03-19: qty 50

## 2015-03-19 MED ORDER — IPRATROPIUM-ALBUTEROL 0.5-2.5 (3) MG/3ML IN SOLN
3.0000 mL | Freq: Four times a day (QID) | RESPIRATORY_TRACT | Status: DC
  Administered 2015-03-19 – 2015-03-22 (×10): 3 mL via RESPIRATORY_TRACT
  Filled 2015-03-19 (×10): qty 3

## 2015-03-19 MED ORDER — LORAZEPAM 2 MG/ML IJ SOLN
2.0000 mg | Freq: Once | INTRAMUSCULAR | Status: AC
  Administered 2015-03-19: 2 mg via INTRAMUSCULAR
  Filled 2015-03-19: qty 1

## 2015-03-19 MED ORDER — LORAZEPAM 2 MG/ML IJ SOLN
0.0000 mg | Freq: Four times a day (QID) | INTRAMUSCULAR | Status: DC
  Administered 2015-03-19: 2 mg via INTRAVENOUS
  Filled 2015-03-19: qty 1

## 2015-03-19 MED ORDER — THIAMINE HCL 100 MG/ML IJ SOLN
100.0000 mg | Freq: Every day | INTRAMUSCULAR | Status: DC
  Administered 2015-03-20: 100 mg via INTRAVENOUS
  Filled 2015-03-19 (×4): qty 1

## 2015-03-19 MED ORDER — FOLIC ACID 1 MG PO TABS
1.0000 mg | ORAL_TABLET | Freq: Every day | ORAL | Status: DC
  Administered 2015-03-19: 1 mg via ORAL
  Filled 2015-03-19: qty 1

## 2015-03-19 MED ORDER — ADULT MULTIVITAMIN W/MINERALS CH
1.0000 | ORAL_TABLET | Freq: Every day | ORAL | Status: DC
  Administered 2015-03-19 – 2015-03-21 (×2): 1 via ORAL
  Filled 2015-03-19 (×4): qty 1

## 2015-03-19 MED ORDER — BUDESONIDE 0.25 MG/2ML IN SUSP
0.2500 mg | Freq: Two times a day (BID) | RESPIRATORY_TRACT | Status: DC
  Filled 2015-03-19 (×3): qty 2

## 2015-03-19 MED ORDER — SODIUM CHLORIDE 0.9 % IJ SOLN: 10.0000 mL | INTRAMUSCULAR | Status: DC | PRN

## 2015-03-19 MED ORDER — LORAZEPAM 2 MG/ML IJ SOLN
0.0000 mg | INTRAMUSCULAR | Status: DC
  Administered 2015-03-19: 2 mg via INTRAVENOUS
  Administered 2015-03-19 (×2): 4 mg via INTRAVENOUS
  Filled 2015-03-19 (×3): qty 1
  Filled 2015-03-19: qty 2

## 2015-03-19 MED ORDER — LORAZEPAM 2 MG/ML IJ SOLN
1.0000 mg | Freq: Once | INTRAMUSCULAR | Status: AC
  Administered 2015-03-19: 1 mg via INTRAVENOUS

## 2015-03-19 MED ORDER — HYDROCODONE-HOMATROPINE 5-1.5 MG/5ML PO SYRP
5.0000 mL | ORAL_SOLUTION | Freq: Four times a day (QID) | ORAL | Status: DC | PRN
  Administered 2015-03-20 – 2015-03-24 (×5): 5 mL via ORAL
  Filled 2015-03-19 (×6): qty 5

## 2015-03-19 MED ORDER — LORAZEPAM 2 MG/ML IJ SOLN
0.0000 mg | Freq: Two times a day (BID) | INTRAMUSCULAR | Status: DC
Start: 2015-03-21 — End: 2015-03-19

## 2015-03-19 MED ORDER — BUDESONIDE-FORMOTEROL FUMARATE 80-4.5 MCG/ACT IN AERO
2.0000 | INHALATION_SPRAY | Freq: Two times a day (BID) | RESPIRATORY_TRACT | Status: DC
  Administered 2015-03-19: 2 via RESPIRATORY_TRACT
  Filled 2015-03-19: qty 6.9

## 2015-03-19 MED ORDER — ENOXAPARIN SODIUM 30 MG/0.3ML ~~LOC~~ SOLN
30.0000 mg | SUBCUTANEOUS | Status: DC
  Administered 2015-03-19: 30 mg via SUBCUTANEOUS
  Filled 2015-03-19: qty 0.3

## 2015-03-19 MED ORDER — ALBUTEROL SULFATE (2.5 MG/3ML) 0.083% IN NEBU: 2.5000 mg | INHALATION_SOLUTION | RESPIRATORY_TRACT | Status: DC | PRN

## 2015-03-19 MED ORDER — ACETAMINOPHEN 650 MG RE SUPP: 650.0000 mg | Freq: Four times a day (QID) | RECTAL | Status: DC | PRN

## 2015-03-19 MED ORDER — LORAZEPAM 2 MG/ML IJ SOLN
1.0000 mg | INTRAMUSCULAR | Status: DC | PRN
  Administered 2015-03-19 – 2015-03-22 (×16): 2 mg via INTRAVENOUS
  Filled 2015-03-19 (×18): qty 1

## 2015-03-19 MED ORDER — METHYLPREDNISOLONE SODIUM SUCC 40 MG IJ SOLR
40.0000 mg | Freq: Three times a day (TID) | INTRAMUSCULAR | Status: DC
  Administered 2015-03-19: 40 mg via INTRAVENOUS
  Filled 2015-03-19 (×3): qty 1

## 2015-03-19 MED ORDER — METHYLPREDNISOLONE SODIUM SUCC 40 MG IJ SOLR
40.0000 mg | Freq: Two times a day (BID) | INTRAMUSCULAR | Status: DC
  Administered 2015-03-20: 40 mg via INTRAVENOUS
  Filled 2015-03-19 (×3): qty 1

## 2015-03-19 MED ORDER — DULOXETINE HCL 60 MG PO CPEP
60.0000 mg | ORAL_CAPSULE | Freq: Every day | ORAL | Status: DC
  Administered 2015-03-19: 60 mg via ORAL
  Filled 2015-03-19: qty 1

## 2015-03-19 MED ORDER — NICOTINE 21 MG/24HR TD PT24
21.0000 mg | MEDICATED_PATCH | Freq: Every day | TRANSDERMAL | Status: DC
  Administered 2015-03-19 – 2015-03-25 (×8): 21 mg via TRANSDERMAL
  Filled 2015-03-19 (×8): qty 1

## 2015-03-19 MED ORDER — IPRATROPIUM-ALBUTEROL 0.5-2.5 (3) MG/3ML IN SOLN
3.0000 mL | RESPIRATORY_TRACT | Status: DC
  Filled 2015-03-19: qty 3

## 2015-03-19 MED ORDER — VITAMIN B-1 100 MG PO TABS
100.0000 mg | ORAL_TABLET | Freq: Every day | ORAL | Status: DC
  Administered 2015-03-19 – 2015-03-21 (×2): 100 mg via ORAL
  Filled 2015-03-19 (×4): qty 1

## 2015-03-19 MED ORDER — ACETAMINOPHEN 325 MG PO TABS
650.0000 mg | ORAL_TABLET | Freq: Four times a day (QID) | ORAL | Status: DC | PRN
  Administered 2015-03-21 – 2015-03-25 (×8): 650 mg via ORAL
  Filled 2015-03-19 (×8): qty 2

## 2015-03-19 MED ORDER — SODIUM CHLORIDE 0.9 % IJ SOLN
3.0000 mL | Freq: Two times a day (BID) | INTRAMUSCULAR | Status: DC
  Administered 2015-03-19 – 2015-03-21 (×5): 3 mL via INTRAVENOUS

## 2015-03-19 MED ORDER — ARFORMOTEROL TARTRATE 15 MCG/2ML IN NEBU
15.0000 ug | INHALATION_SOLUTION | Freq: Two times a day (BID) | RESPIRATORY_TRACT | Status: DC
  Administered 2015-03-20 – 2015-03-25 (×9): 15 ug via RESPIRATORY_TRACT
  Filled 2015-03-19 (×15): qty 2

## 2015-03-19 NOTE — Progress Notes (Signed)
INITIAL NUTRITION ASSESSMENT  DOCUMENTATION CODES Per approved criteria  -Not Applicable   INTERVENTION: Diet advancement per MD Add Resource Breeze po TID, each supplement provides 250 kcal and 9 grams of protein Continue Multivitamin with minerals, folic acid, and thiamine supplements  NUTRITION DIAGNOSIS: Inadequate oral intake related to alcohol withdrawal as evidenced by NPO status.   Goal: Pt to meet >/= 90% of their estimated nutrition needs   Monitor:  Diet advancement, PO intake, Weight trend, Labs  Reason for Assessment: Malnutrition Screening Tool  54 y.o. female  Admitting Dx: <principal problem not specified>  ASSESSMENT: 54 yo female with hx of Copd , tobacco use, etoh abuse, apparently c/o dyspnea x2 days, along with cough with yellow sputum.Pt admitted for Copd exacerbation.  Pt unavailable x 3 attempts. Sterile procedure in progress at third visit. Pt NPO. Per MD note, advance to regular diet with supplements when able to tolerate. Weight history shows pt has lost 15% of her body weight in the past 11 months. During previous admission, pt was provided with Resource Breeze supplements.   Per MD note, pt withdrawing and difficult to understand; "states she came here to detox".   Labs: low sodium low hemoglobin, elevated AST and ALT  Height: Ht Readings from Last 1 Encounters:  03/19/15  (1.549 m)    Weight: Wt Readings from Last 1 Encounters:  03/19/15 102 lb 11.2 oz (46.584 kg)    Ideal Body Weight: 105 lbs  % Ideal Body Weight: 97%  Wt Readings from Last 10 Encounters:  03/19/15 102 lb 11.2 oz (46.584 kg)  01/03/15 98 lb (44.453 kg)  12/09/14 104 lb 0.9 oz (47.2 kg)  12/01/14 101 lb 8 oz (46.04 kg)  05/23/14 112 lb (50.803 kg)  04/23/14 120 lb (54.432 kg)  01/06/14 125 lb (56.7 kg)  08/02/13 125 lb (56.7 kg)    Usual Body Weight: 125 lbs  % Usual Body Weight: 82%  BMI:  Body mass index is 19.41 kg/(m^2).  Estimated Nutritional  Needs: Kcal: 1400-1600 Protein: 70-80 grams Fluid: 1.4-1.6 L/day  Skin: closed incision on right arm  Diet Order: Diet NPO time specified  EDUCATION NEEDS: -No education needs identified at this time   Intake/Output Summary (Last 24 hours) at 03/19/15 1515 Last data filed at 03/19/15 0942  Gross per 24 hour  Intake    990 ml  Output      0 ml  Net    990 ml    Last BM: 3/28  Labs:   Recent Labs Lab 03/18/15 2109 03/18/15 2202 03/19/15 0505  NA 125* 125* 125*  K 3.8 3.8 4.5  CL 87* 87* 89*  CO2 24  --  23  BUN <5* 5* 9  CREATININE 0.67 0.80 0.75  CALCIUM 8.3*  --  8.5  GLUCOSE 130* 128* 160*    CBG (last 3)  No results for input(s): GLUCAP in the last 72 hours.  Scheduled Meds: . arformoterol  15 mcg Nebulization BID  . budesonide (PULMICORT) nebulizer solution  0.25 mg Nebulization BID  . DULoxetine  60 mg Oral Daily  . enoxaparin (LOVENOX) injection  30 mg Subcutaneous Q24H  . folic acid  1 mg Oral Daily  . ipratropium-albuterol  3 mL Nebulization Q4H  . levofloxacin (LEVAQUIN) IV  500 mg Intravenous Q24H  . LORazepam  0-4 mg Intravenous Q2H   Followed by  . [START ON 03/21/2015] LORazepam  0-4 mg Intravenous Q4H  . methylPREDNISolone (SOLU-MEDROL) injection  40 mg Intravenous  3 times per day  . multivitamin with minerals  1 tablet Oral Daily  . nicotine  21 mg Transdermal Daily  . sodium chloride  10-40 mL Intracatheter Q12H  . sodium chloride  3 mL Intravenous Q12H  . thiamine  100 mg Oral Daily   Or  . thiamine  100 mg Intravenous Daily    Continuous Infusions: . dextrose 5 % and 0.45 % NaCl with KCl 20 mEq/L      Past Medical History  Diagnosis Date  . COPD (chronic obstructive pulmonary disease)   . Anxiety   . Shortness of breath   . Arthritis   . GERD (gastroesophageal reflux disease)   . Full dentures   . Insomnia   . Alcohol abuse     Past Surgical History  Procedure Laterality Date  . Arm surgery    . Hand surgery  1990     both rt/lt carpal tunnels  . Shoulder surgery      right and left-age [age]  . Orif ulnar / radial shaft fracture  1990    right-got infection-had total 10 surgeies 1990  . Orif ulnar fracture Left 05/23/2014    Procedure: OPEN REDUCTION INTERNAL FIXATION (ORIF) LEFT ULNAR FRACTURE;  Surgeon: Harvie JuniorJohn L Graves, MD;  Location: [facility];  Service: Orthopedics;  Laterality: Left;    Ian Malkineanne Barnett RD, LDN Inpatient Clinical Dietitian Pager: [phone] After Hours Pager: [phone]

## 2015-03-19 NOTE — Significant Event (Signed)
Rapid Response Event Note  Overview: Time Called: 1714 Arrival Time: 1717 Event Type: Other (Comment)  Initial Focused Assessment:  Called by Rn as per protocol for high CIWA score of 28.  Upon my arrival to patients room, RN at bedside.  Patient in wrist restraints and posey belt, very agitiated, and severe tremors.  VSS, skin warm and dry   Interventions:  RN administered 4 mg IV ativan as ordered with no relief, patient still agitated and attempting to sit up and pull at things.  AS per RN has had a total of 15 mg ativan today    Event Summary:  Recommend RN to call MD and update, MD updated.  RN to call if assistance needed   at      at          [facility], Maryagnes Amosenise Ann

## 2015-03-19 NOTE — Consult Note (Signed)
PULMONARY / CRITICAL CARE MEDICINE   Name: Rhonda Mitchell MRN: [id number] DOB: Sep 02, 1961    ADMISSION DATE:  03/18/2015 CONSULTATION DATE:  03/19/2015  REFERRING MD :  IM (Short)  CHIEF COMPLAINT:  ETOH withdrawal  INITIAL PRESENTATION:  54 y.o. F admitted 3/28 for AECOPD.  On 3/29, had ETOH withdrawal with high CIWA scores despite high doses Ativan.  PCCM called for consideration precedex.   STUDIES:  CXR 3/28 >>> no acute process.  SIGNIFICANT EVENTS: 3/28 - admit 3/29 - transfer to ICU, started on precedex   HISTORY OF PRESENT ILLNESS:  Pt is encephalopathic; therefore, this HPI is obtained from chart review. Rhonda Mitchell is a 54 y.o. F with PMH as outlined below.  She presented to [facility] ED 3/28 with dyspnea and productive cough x 2 days.  She was admitted for AECOPD and was placed on CIWA protocol for her ETOH abuse. On 3/29, pt began to have increased agitation and signs of ETOH withdrawal.  She had received  Ativan over 12 hours and continued to have tremors and agitation.  Rapid Response called for CIWA score 28. PCCM was called for consideration of precedex infusion.   PAST MEDICAL HISTORY :   has a past medical history of COPD (chronic obstructive pulmonary disease); Anxiety; Shortness of breath; Arthritis; GERD (gastroesophageal reflux disease); Full dentures; Insomnia; and Alcohol abuse.  has past surgical history that includes arm surgery; Hand surgery (1990); Shoulder surgery; ORIF ulnar / radial shaft fracture (1990); and ORIF ulnar fracture (Left, 05/23/2014). Prior to Admission medications   Medication Sig Start Date End Date Taking? Authorizing Provider  albuterol-ipratropium (COMBIVENT) 18-103 MCG/ACT inhaler Inhale 2 puffs into the lungs every 6 (six) hours as needed for wheezing.   Yes Historical Provider, MD  b complex vitamins tablet Take 1 tablet by mouth daily.   Yes Historical Provider, MD  budesonide-formoterol (SYMBICORT) 80-4.5 MCG/ACT inhaler  Inhale 2 puffs into the lungs 2 (two) times daily.   Yes Historical Provider, MD  diphenhydramine-acetaminophen (TYLENOL PM) 25-500 MG TABS Take 1 tablet by mouth at bedtime as needed.   Yes Historical Provider, MD  DULoxetine (CYMBALTA) 60 MG capsule Take 60 mg by mouth daily.   Yes Historical Provider, MD  ibuprofen (ADVIL,MOTRIN) 200 MG tablet Take 400 mg by mouth every 6 (six) hours as needed for mild pain.   Yes Historical Provider, MD  loratadine (CLARITIN) 10 MG tablet Take 10 mg by mouth daily.   Yes Historical Provider, MD  magnesium citrate SOLN Take 1 Bottle by mouth once.   Yes Historical Provider, MD  POTASSIUM PO Take 1 tablet by mouth daily.   Yes Historical Provider, MD  tiotropium (SPIRIVA) 18 MCG inhalation capsule Place 18 mcg into inhaler and inhale daily.   Yes Historical Provider, MD  amoxicillin (AMOXIL) 500 MG capsule Take 1 capsule (500 mg total) by mouth 3 (three) times daily. Patient not taking: Reported on 03/18/2015 01/03/15   Geoffery Lyons, MD  HYDROcodone-acetaminophen [facility]) 5-325 MG per tablet Take 1-2 tablets by mouth every 6 (six) hours as needed. Patient not taking: Reported on 03/18/2015 01/03/15   Geoffery Lyons, MD  oxyCODONE-acetaminophen (PERCOCET/ROXICET) 5-325 MG per tablet Take 1-2 tablets by mouth every 6 (six) hours as needed for severe pain. Patient not taking: Reported on 11/30/2014 05/23/14   Marshia Ly, PA-C  sulfamethoxazole-trimethoprim (BACTRIM DS,SEPTRA DS) 800-160 MG per tablet Take 1 tablet by mouth every 12 (twelve) hours. Patient not taking: Reported on 03/18/2015 12/01/14   Marinda Elk,  MD   No Known Allergies  FAMILY HISTORY:  Family History  Problem Relation Age of Onset  . Breast cancer Mother     SOCIAL HISTORY:  reports that she has been smoking Cigarettes.  She has a 20 pack-year smoking history. She has never used smokeless tobacco. She reports that she drinks about 1.8 oz of alcohol per week. She reports that she does not  use illicit drugs.  REVIEW OF SYSTEMS:  Unable to obtain as pt is encephalopathic.  SUBJECTIVE:   VITAL SIGNS: Temp:  [97.5 F (36.4 C)-99 F (37.2 C)] 98.2 F (36.8 C) (03/29 1500) Pulse Rate:  [76-91] 85 (03/29 1212) Resp:  [13-36] 23 (03/29 1212) BP: (125-153)/(70-103) 151/79 mmHg (03/29 1212) SpO2:  [89 %-100 %] 95 % (03/29 1212) FiO2 (%):  [60 %] 60 % (03/28 1906) Weight:  [46.584 kg (102 lb 11.2 oz)] 46.584 kg (102 lb 11.2 oz) (03/29 0126) HEMODYNAMICS:   VENTILATOR SETTINGS: Vent Mode:  [-]  FiO2 (%):  [60 %] 60 % INTAKE / OUTPUT: Intake/Output      03/28 0701 - 03/29 0700 03/29 0701 - 03/30 0700   P.O. 240 240   I.V. (mL/kg) 410 (8.8)    IV Piggyback 100    Total Intake(mL/kg) 750 (16.1) 240 (5.2)   Net +750 +240        Urine Occurrence 2 x      PHYSICAL EXAMINATION: General: Chronically ill appearing female, in NAD. Neuro: Awake but does not answer questions appropriately.  MAE's.  No focal deficits.  + Asterixis. HEENT: [place]/AT. PERRL, sclerae anicteric. Cardiovascular: RRR, no M/R/G.  Lungs: Respirations even and unlabored.  Faint expiratory wheeze bilaterally. Abdomen: BS x 4, soft, NT/ND.  Musculoskeletal: No gross deformities, no edema.  Skin: Intact, warm, no rashes.  LABS:  CBC  Recent Labs Lab 03/18/15 2109 03/18/15 2202 03/19/15 0505  WBC 9.2  --  4.2  HGB 12.3 16.7* 11.6*  HCT 35.2* 49.0* 33.6*  PLT 135*  --  130*   Coag's No results for input(s): APTT, INR in the last 168 hours. BMET  Recent Labs Lab 03/18/15 2109 03/18/15 2202 03/19/15 0505  NA 125* 125* 125*  K 3.8 3.8 4.5  CL 87* 87* 89*  CO2 24  --  23  BUN <5* 5* 9  CREATININE 0.67 0.80 0.75  GLUCOSE 130* 128* 160*   Electrolytes  Recent Labs Lab 03/18/15 2109 03/19/15 0505  CALCIUM 8.3* 8.5   Sepsis Markers No results for input(s): LATICACIDVEN, PROCALCITON, O2SATVEN in the last 168 hours. ABG  Recent Labs Lab 03/18/15 2019  PHART 7.449  PCO2ART 32.0*   PO2ART 266.0*   Liver Enzymes  Recent Labs Lab 03/18/15 2109 03/19/15 0505  AST 120* 100*  ALT 49* 46*  ALKPHOS 92 86  BILITOT 1.1 1.2  ALBUMIN 3.9 3.7   Cardiac Enzymes No results for input(s): TROPONINI, PROBNP in the last 168 hours. Glucose No results for input(s): GLUCAP in the last 168 hours.  Imaging Dg Chest Portable 1 View  03/18/2015   CLINICAL DATA:  Shortness of breath.  EXAM: PORTABLE CHEST - 1 VIEW  COMPARISON:  12/07/2014  FINDINGS: Artifact overlies the chest. Heart size is normal. Mediastinal shadows are normal. The lungs are clear. No effusions. No bony abnormalities.  IMPRESSION: No active disease.   Electronically Signed   By: Paulina Fusi M.D.   On: 03/18/2015 21:04     ASSESSMENT / PLAN:  NEUROLOGIC A:   Korsakoff syndrome /  encephalopathy ETOH abuse with withdrawal and concern for early DT's - did not respond well to multiple doses of Ativan At risk ETOH withdrawal seizures Hx Anxiety, insomnia P:   Transfer to ICU for initiation of precedex infusion. Ativan PRN. Continue thiamine / folate / multivitamin. Seizure precautions. Hold outpatient cybalta, norco, percocet.  PULMONARY A: AECOPD Tobacco use disorder Protecting airway on own currently P:   Continue BD's, solumedrol, abx (On levaquin, day 2). CXR intermittently. Nicotine patch. Smoking cessation counseling. At risk intubation if deteriorates.  CARDIOVASCULAR A:  No acute issues P:  Monitor hemodynamics.  RENAL A:   Hyponatremia likely due to beer potomania (Serum Osm 273) P:   NS @ 75. BMP in AM.  GASTROINTESTINAL A:   Transaminitis with AST > ALT c/w ETOH abuse.  Doubt hepatitis given level of increased LFTs. Nutrition P:   Trend LFT's. F/u hepatitis panel. NPO for now given concern for airway compromise.  HEMATOLOGIC A:   Macrocytic anemia - liely due to ETOH. Folate, B12, TSH normal. Thrombocytopenia - likely due to ETOH VTE Prophylaxis P:  Transfuse per  usual ICU guidelines. SCD's / Lovenox. CBC in AM.  INFECTIOUS A:   AECOPD P:   Abx: Levaquin, start date 3/29, day 2/7.  ENDOCRINE A:   Hyperglycemia  P:   SSI if glucose consistently > 180.    Family updated: None.  Interdisciplinary Family Meeting v Palliative Care Meeting:  Due by: 4/4.  CC time:  30 minutes.   Rutherford Guysahul Glennys Schorsch, [place]PA - [facility] Pager: [phone]  or [phone] 03/19/2015, 6:30 PM

## 2015-03-19 NOTE — Progress Notes (Signed)
Pt transferred to 2M01 per order. Report called to RN on unit also notified significant other/James of room change. CCMD aware of new room assignment

## 2015-03-19 NOTE — Progress Notes (Signed)
Pt agitated asking food and something to drink, order was NPO. Tried to paged Dr. Ricki MillerPang ( MD on call) 3x, still awaiting for the reply. Will continue to monitor pt

## 2015-03-19 NOTE — Progress Notes (Signed)
Dr.James Selena BattenKim was aware CIWA was 19 upon admission and ordered 1mg  IV Ativan now. Will continue to monitor pt

## 2015-03-19 NOTE — Progress Notes (Signed)
Pt has orders to be transferred at this time. Pt agitated and attempting to get out of bed. [facility] R[facility]. Taking pt down at this time.

## 2015-03-19 NOTE — Progress Notes (Signed)
Pt arrived to 2M01 via bed from 3S; appears anxious, restless, tremors noted; sitter at bedside; pt does not answer questions or follow commands; oriented pt to staff and unit; side rails up; restraints per order; precedex gtt initiated Marvia PicklesJames, Jorgina Binning Sara, RN

## 2015-03-19 NOTE — Progress Notes (Signed)
md and Rapid response called regarding pt elevated CIWA scores and continued agitation.

## 2015-03-19 NOTE — ED Provider Notes (Signed)
CSN: [id number]     Arrival date & time 03/18/15  1907 History   First MD Initiated Contact with Patient 03/18/15 1921     Chief Complaint  Patient presents with  . Respiratory Distress     (Consider location/radiation/quality/duration/timing/severity/associated sxs/prior Treatment) Patient is a 54 y.o. female presenting with shortness of breath.  Shortness of Breath Severity:  Severe Onset quality:  Gradual Duration:  3 days Timing:  Constant Progression:  Worsening Chronicity:  Recurrent Relieved by:  Nothing Worsened by:  Nothing tried Associated symptoms: cough   Associated symptoms: no abdominal pain, no chest pain, no fever, no rash, no sore throat and no vomiting   Risk factors: no hx of cancer and no hx of PE/DVT     Past Medical History  Diagnosis Date  . COPD (chronic obstructive pulmonary disease)   . Anxiety   . Shortness of breath   . Arthritis   . GERD (gastroesophageal reflux disease)   . Full dentures   . Insomnia   . Alcohol abuse    Past Surgical History  Procedure Laterality Date  . Arm surgery    . Hand surgery  1990    both rt/lt carpal tunnels  . Shoulder surgery      right and left-age [age]  . Orif ulnar / radial shaft fracture  1990    right-got infection-had total 10 surgeies 1990  . Orif ulnar fracture Left 05/23/2014    Procedure: OPEN REDUCTION INTERNAL FIXATION (ORIF) LEFT ULNAR FRACTURE;  Surgeon: Harvie Junior, MD;  Location: [facility];  Service: Orthopedics;  Laterality: Left;   Family History  Problem Relation Age of Onset  . Breast cancer Mother    History  Substance Use Topics  . Smoking status: Current Every Day Smoker -- 1.00 packs/day for 20 years    Types: Cigarettes  . Smokeless tobacco: Never Used  . Alcohol Use: 1.8 oz/week    3 Glasses of wine per week     Comment: 6-pack a week   OB History    No data available     Review of Systems  Constitutional: Negative for fever and chills.  HENT:  Negative for nosebleeds and sore throat.   Eyes: Negative for visual disturbance.  Respiratory: Positive for cough and shortness of breath.   Cardiovascular: Negative for chest pain.  Gastrointestinal: Negative for nausea, vomiting, abdominal pain, diarrhea and constipation.  Genitourinary: Negative for dysuria.  Skin: Negative for rash.  Neurological: Negative for weakness.  All other systems reviewed and are negative.     Allergies  Review of patient's allergies indicates no known allergies.  Home Medications   Prior to Admission medications   Medication Sig Start Date End Date Taking? Authorizing Provider  albuterol-ipratropium (COMBIVENT) 18-103 MCG/ACT inhaler Inhale 2 puffs into the lungs every 6 (six) hours as needed for wheezing.   Yes Historical Provider, MD  b complex vitamins tablet Take 1 tablet by mouth daily.   Yes Historical Provider, MD  budesonide-formoterol (SYMBICORT) 80-4.5 MCG/ACT inhaler Inhale 2 puffs into the lungs 2 (two) times daily.   Yes Historical Provider, MD  diphenhydramine-acetaminophen (TYLENOL PM) 25-500 MG TABS Take 1 tablet by mouth at bedtime as needed.   Yes Historical Provider, MD  DULoxetine (CYMBALTA) 60 MG capsule Take 60 mg by mouth daily.   Yes Historical Provider, MD  ibuprofen (ADVIL,MOTRIN) 200 MG tablet Take 400 mg by mouth every 6 (six) hours as needed for mild pain.   Yes Historical  Provider, MD  loratadine (CLARITIN) 10 MG tablet Take 10 mg by mouth daily.   Yes Historical Provider, MD  magnesium citrate SOLN Take 1 Bottle by mouth once.   Yes Historical Provider, MD  POTASSIUM PO Take 1 tablet by mouth daily.   Yes Historical Provider, MD  tiotropium (SPIRIVA) 18 MCG inhalation capsule Place 18 mcg into inhaler and inhale daily.   Yes Historical Provider, MD  amoxicillin (AMOXIL) 500 MG capsule Take 1 capsule (500 mg total) by mouth 3 (three) times daily. Patient not taking: Reported on 03/18/2015 01/03/15   Geoffery Lyons, MD   HYDROcodone-acetaminophen [facility]) 5-325 MG per tablet Take 1-2 tablets by mouth every 6 (six) hours as needed. Patient not taking: Reported on 03/18/2015 01/03/15   Geoffery Lyons, MD  oxyCODONE-acetaminophen (PERCOCET/ROXICET) 5-325 MG per tablet Take 1-2 tablets by mouth every 6 (six) hours as needed for severe pain. Patient not taking: Reported on 11/30/2014 05/23/14   Marshia Ly, PA-C  sulfamethoxazole-trimethoprim (BACTRIM DS,SEPTRA DS) 800-160 MG per tablet Take 1 tablet by mouth every 12 (twelve) hours. Patient not taking: Reported on 03/18/2015 12/01/14   Marinda Elk, MD   BP 153/78 mmHg  Pulse 87  Temp(Src) 98.9 F (37.2 C) (Oral)  Resp 24  Ht  (1.549 m)  Wt 102 lb 11.2 oz (46.584 kg)  BMI 19.41 kg/m2  SpO2 96% Physical Exam  Constitutional: She is oriented to person, place, and time. No distress.  HENT:  Head: Normocephalic and atraumatic.  Eyes: EOM are normal. Pupils are equal, round, and reactive to light.  Neck: Normal range of motion. Neck supple.  Cardiovascular: Normal rate and intact distal pulses.   Pulmonary/Chest: She is in respiratory distress.  Decreased air movement bilaterally  Abdominal: Soft. There is no tenderness.  Musculoskeletal: Normal range of motion.  Neurological: She is alert and oriented to person, place, and time.  Skin: No rash noted. She is not diaphoretic.  Psychiatric: She has a normal mood and affect.    ED Course  Procedures (including critical care time) Labs Review Labs Reviewed  CBC - Abnormal; Notable for the following:    RBC 3.40 (*)    HCT 35.2 (*)    MCV 103.5 (*)    MCH 36.2 (*)    Platelets 135 (*)    All other components within normal limits  COMPREHENSIVE METABOLIC PANEL - Abnormal; Notable for the following:    Sodium 125 (*)    Chloride 87 (*)    Glucose, Bld 130 (*)    BUN <5 (*)    Calcium 8.3 (*)    AST 120 (*)    ALT 49 (*)    All other components within normal limits  BRAIN NATRIURETIC  PEPTIDE - Abnormal; Notable for the following:    B Natriuretic Peptide 234.0 (*)    All other components within normal limits  ETHANOL - Abnormal; Notable for the following:    Alcohol, Ethyl (B) 139 (*)    All other components within normal limits  I-STAT ARTERIAL BLOOD GAS, ED - Abnormal; Notable for the following:    pCO2 arterial 32.0 (*)    pO2, Arterial 266.0 (*)    All other components within normal limits  I-STAT CHEM 8, ED - Abnormal; Notable for the following:    Sodium 125 (*)    Chloride 87 (*)    BUN 5 (*)    Glucose, Bld 128 (*)    Calcium, Ion 0.93 (*)  Hemoglobin 16.7 (*)    HCT 49.0 (*)    All other components within normal limits  TSH  BLOOD GAS, ARTERIAL  CORTISOL  OSMOLALITY  SODIUM, URINE, RANDOM  OSMOLALITY, URINE  HEMOGLOBIN A1C  CBC  COMPREHENSIVE METABOLIC PANEL  I-STAT TROPOININ, ED    Imaging Review Dg Chest Portable 1 View  03/18/2015   CLINICAL DATA:  Shortness of breath.  EXAM: PORTABLE CHEST - 1 VIEW  COMPARISON:  12/07/2014  FINDINGS: Artifact overlies the chest. Heart size is normal. Mediastinal shadows are normal. The lungs are clear. No effusions. No bony abnormalities.  IMPRESSION: No active disease.   Electronically Signed   By: Paulina FusiMark  Shogry M.D.   On: 03/18/2015 21:04     EKG Interpretation   Date/Time:  Monday March 18 2015 19:12:06 EDT Ventricular Rate:  94 PR Interval:  153 QRS Duration: 86 QT Interval:  346 QTC Calculation: 433 R Axis:   -174 Text Interpretation:  Sinus rhythm Probable left atrial enlargement Low  voltage with right axis deviation Baseline wander in lead(s) II III aVR  aVL aVF V1 V3 V5 V6 artifact present, which limits interpretation  Confirmed by Lincoln Brighamees, Liz [phone]([id number]) on 03/18/2015 7:19:22 PM      MDM   Final diagnoses:  Shortness of breath  Hyponatremia    54 y/o F w/ PMH COPD, alcoholism p/w a few days of shortness of breath and cough  When EMS arrived, patient in severe distress.  Placed on  CPAP, mag, solumedrol, epi all given PTA  On arrival, patient w/ sig increased WOB and tremulous in the upper/lower extremities.  Descent air movement bilat but somewhat decreased w/o obvious wheezing.  Concern for COPD exacerbation  Placed on bipap, VS sig for tachypnea.  CXR w/o infiltrate.  Cbc/trp WNL.  bnp w/ hyponatremia.  Ethanol 139.  Feel hyponatremia likely beer potomania.  Patient w/ continued increased WOB, but improved since presentation, will admit for hyponatremia and COPD exacerbation.    Silas FloodErik Shaneca Orne, MD 03/19/15 [id number]  Tilden FossaElizabeth Rees, MD 03/23/15 [phone]

## 2015-03-19 NOTE — Progress Notes (Addendum)
[facility] PROGRESS NOTE  Sherrilee GillesMichelle Swinford ZOX:[id number] DOB: 02-03-1961 DOA: 03/18/2015 PCP: [name], [name], MD  Brief summary  [age] yo female with hx of Copd , tobacco use, etoh abuse, apparently c/o dyspnea x2 days, along with cough with yellow sputum. Pt received solumedrol, epi pen, bipap en route. CXR in ED negative for acute process. Pt was tremulous, Pt will be admitted for Copd exacerbation.  She has been through DTs in the past.  CIWA scores have been 19-24 this morning.  Changing CIWA to q2h and transferring to stepdown for now.  CAse discussed with Dr. Molli KnockYacoub PCCM who recommended we call critical care if she has respiratory suppression.  Agreed with transfer to stepdown.    Assessment/Plan  EtOH withdrawal.  Difficult to get history right now but I think she is saying she drinks several pints of liquor a day and beer.  Has been through DTs previously. This is day 1. -  Stepdown status -  continuous pulse ox -  CIWA q2h -  Continue thiamine, folate, MVI -  NPO for now -  CT from last year with fatty liver -  Continue sitter -  Seizure precautions  Acute Copd exacerbation -  Decrease to Solumedrol 40mg  iv q8h -  D/c Spiriva as too jittery to take inhalers at this time -  duonebs q6h -  Continue IV abx day 2  Hyponatremia, likely beer potomania  -  Low serum osms -  TSH 1.17 -  Cortisol level pending -  Continue IVF  Tobacco use -  Nicotine patch -  Counsel when mentation more appropriate  Hyperglycemia - hga1c pending  Macrocytic anemia and thrombocytopenia likely secondary to marrow suppression from EtOH abuse.   -  Occult stool -  Iron studies, b12, folate -  TSH wnl  Transaminitis likely due to ETOH given ratio -  Acute hepatitis panel   Staples in right arm placed 3/25 now with surrounding cellulitis -  Continue levoflox -  Removal of 7 staples on 4/5  Severe protein calorie malnutritino -  Regular diet and supplements when able to tolerate  diet   Diet:  NPO Access:  None >> pulled out IV IVF:  yes Proph:  lovenox  Code Status: full Family Communication: patient alone Disposition Plan: transfer to stepdown   Consultants:  none  Procedures:  CXR  Antibiotics:  Levoflox 3/28 >>  HPI/Subjective:  Muffled voice and withdrawing therefore difficult to understand.  Drinks about 1-2 pints of liquor plus unknown amount of beer.  Breathing is a little better.  States she came here to detox.    Objective: Filed Vitals:   03/19/15 0126 03/19/15 0142 03/19/15 0527 03/19/15 0942  BP:   149/88 138/92  Pulse:   83 84  Temp:   97.5 F (36.4 C) 98.1 F (36.7 C)  TempSrc:   Oral Oral  Resp:   22 20  Height:      Weight: 46.584 kg (102 lb 11.2 oz)     SpO2:  96% 99% 98%    Intake/Output Summary (Last 24 hours) at 03/19/15 1008 Last data filed at 03/19/15 0942  Gross per 24 hour  Intake    990 ml  Output      0 ml  Net    990 ml   Filed Weights   03/19/15 0126  Weight: 46.584 kg (102 lb 11.2 oz)    Exam:   General:  Cachectic F, tremulous  HEENT:  NCAT, MMM, muffled voice, tongue  fasciculations  Cardiovascular:  Tachycardic RR, nl S1, S2 no mrg, 2+ pulses, warm extremities  Respiratory:  Diminished bilateral breath sounds, no increased WOB  Abdomen:   NABS, soft, NT/ND  MSK:   Normal tone and bulk, no LEE  Neuro:  Full body tremors  Data Reviewed: Basic Metabolic Panel:  Recent Labs Lab 03/18/15 2109 03/18/15 2202 03/19/15 0505  NA 125* 125* 125*  K 3.8 3.8 4.5  CL 87* 87* 89*  CO2 24  --  23  GLUCOSE 130* 128* 160*  BUN <5* 5* 9  CREATININE 0.67 0.80 0.75  CALCIUM 8.3*  --  8.5   Liver Function Tests:  Recent Labs Lab 03/18/15 2109 03/19/15 0505  AST 120* 100*  ALT 49* 46*  ALKPHOS 92 86  BILITOT 1.1 1.2  PROT 7.2 7.5  ALBUMIN 3.9 3.7   No results for input(s): LIPASE, AMYLASE in the last 168 hours. No results for input(s): AMMONIA in the last 168 hours. CBC:  Recent  Labs Lab 03/18/15 2109 03/18/15 2202 03/19/15 0505  WBC 9.2  --  4.2  HGB 12.3 16.7* 11.6*  HCT 35.2* 49.0* 33.6*  MCV 103.5*  --  106.0*  PLT 135*  --  130*   Cardiac Enzymes: No results for input(s): CKTOTAL, CKMB, CKMBINDEX, TROPONINI in the last 168 hours. BNP (last 3 results)  Recent Labs  03/18/15 2109  BNP 234.0*    ProBNP (last 3 results) No results for input(s): PROBNP in the last 8760 hours.  CBG: No results for input(s): GLUCAP in the last 168 hours.  No results found for this or any previous visit (from the past 240 hour(s)).   Studies: Dg Chest Portable 1 View  03/18/2015   CLINICAL DATA:  Shortness of breath.  EXAM: PORTABLE CHEST - 1 VIEW  COMPARISON:  12/07/2014  FINDINGS: Artifact overlies the chest. Heart size is normal. Mediastinal shadows are normal. The lungs are clear. No effusions. No bony abnormalities.  IMPRESSION: No active disease.   Electronically Signed   By: Paulina Fusi M.D.   On: 03/18/2015 21:04    Scheduled Meds: . arformoterol  15 mcg Nebulization BID  . budesonide (PULMICORT) nebulizer solution  0.25 mg Nebulization BID  . DULoxetine  60 mg Oral Daily  . enoxaparin (LOVENOX) injection  30 mg Subcutaneous Q24H  . folic acid  1 mg Oral Daily  . ipratropium-albuterol  3 mL Nebulization Q4H  . levofloxacin (LEVAQUIN) IV  500 mg Intravenous Q24H  . LORazepam  0-4 mg Intravenous Q2H   Followed by  . [START ON 03/21/2015] LORazepam  0-4 mg Intravenous Q4H  . methylPREDNISolone (SOLU-MEDROL) injection  40 mg Intravenous 3 times per day  . multivitamin with minerals  1 tablet Oral Daily  . nicotine  21 mg Transdermal Daily  . sodium chloride  3 mL Intravenous Q12H  . thiamine  100 mg Oral Daily   Or  . thiamine  100 mg Intravenous Daily   Continuous Infusions: . dextrose 5 % and 0.45 % NaCl with KCl 20 mEq/L      Active Problems:   Alcohol dependence   COPD exacerbation   Hyponatremia   Hyperglycemia    Time spent: 30  min    Rhonda Mitchell  [facility] Pager [phone]. If 7PM-7AM, please contact night-coverage at [email or web address], password [facility] 03/19/2015, 10:08 AM  LOS: 1 day

## 2015-03-19 NOTE — Care Management Note (Unsigned)
    Page 1 of 1   03/19/2015     2:47:21 PM CARE MANAGEMENT NOTE 03/19/2015  Patient:  Rhonda Mitchell,Rhonda Mitchell   Account Number:  [id number]  Date Initiated:  03/19/2015  Documentation initiated by:  Vala Raffo  Subjective/Objective Assessment:   Pt adm on 03/18/15 with respiratory failure/COPD.  Current ETOH use and withdrawl. PTA, pt resides at home with spouse.     Action/Plan:   Will follow for dc needs as pt progresses.  May transfer to stepdown. Will consult CSW for ETOH counseling.   Anticipated DC Date:  03/22/2015   Anticipated DC Plan:  HOME/SELF CARE  In-house referral  Clinical Social Worker      DC Planning Services  CM consult      Choice offered to / List presented to:             Status of service:  In process, will continue to follow Medicare Important Message given?   (If response is "NO", the following Medicare IM given date fields will be blank) Date Medicare IM given:   Medicare IM given by:   Date Additional Medicare IM given:   Additional Medicare IM given by:    Discharge Disposition:    Per UR Regulation:  Reviewed for med. necessity/level of care/duration of stay  If discussed at Long Length of Stay Meetings, dates discussed:    Comments:

## 2015-03-20 LAB — GLUCOSE, CAPILLARY: Glucose-Capillary: 130 mg/dL — ABNORMAL HIGH (ref 70–99)

## 2015-03-20 LAB — MAGNESIUM: MAGNESIUM: 2.3 mg/dL (ref 1.5–2.5)

## 2015-03-20 LAB — HEMOGLOBIN A1C
Hgb A1c MFr Bld: 5.5 % (ref 4.8–5.6)
MEAN PLASMA GLUCOSE: 111 mg/dL

## 2015-03-20 LAB — PHOSPHORUS: Phosphorus: 4.2 mg/dL (ref 2.3–4.6)

## 2015-03-20 MED ORDER — CHLORHEXIDINE GLUCONATE CLOTH 2 % EX PADS
6.0000 | MEDICATED_PAD | Freq: Every day | CUTANEOUS | Status: AC
Start: 2015-03-20 — End: 2015-03-24
  Administered 2015-03-20 – 2015-03-24 (×5): 6 via TOPICAL

## 2015-03-20 MED ORDER — INFLUENZA VAC SPLIT QUAD 0.5 ML IM SUSY
0.5000 mL | PREFILLED_SYRINGE | INTRAMUSCULAR | Status: AC
  Administered 2015-03-21: 0.5 mL via INTRAMUSCULAR
  Filled 2015-03-20: qty 0.5

## 2015-03-20 MED ORDER — MUPIROCIN 2 % EX OINT
1.0000 "application " | TOPICAL_OINTMENT | Freq: Two times a day (BID) | CUTANEOUS | Status: AC
  Administered 2015-03-20 – 2015-03-24 (×10): 1 via NASAL
  Filled 2015-03-20 (×2): qty 22

## 2015-03-20 MED ORDER — ENOXAPARIN SODIUM 30 MG/0.3ML ~~LOC~~ SOLN
30.0000 mg | SUBCUTANEOUS | Status: DC
  Administered 2015-03-20: 30 mg via SUBCUTANEOUS
  Filled 2015-03-20 (×2): qty 0.3

## 2015-03-20 MED ORDER — METHYLPREDNISOLONE SODIUM SUCC 40 MG IJ SOLR
40.0000 mg | Freq: Every day | INTRAMUSCULAR | Status: DC
  Filled 2015-03-20: qty 1

## 2015-03-20 NOTE — Progress Notes (Signed)
Patient reports she arrived at hospital with clothing and cell phone. No patient belongs were brought to upon transfer. Departments 3S and 3E were called and they report no belongings present.

## 2015-03-20 NOTE — Progress Notes (Signed)
PULMONARY / CRITICAL CARE MEDICINE   Name: Rhonda Mitchell MRN: [id number] DOB: 13-Mar-1961    ADMISSION DATE:  03/18/2015 CONSULTATION DATE:  03/20/2015  REFERRING MD :  IM (Short)  CHIEF COMPLAINT:  ETOH withdrawal  INITIAL PRESENTATION:  54 y.o. F admitted 3/28 for AECOPD.  On 3/29, had ETOH withdrawal with high CIWA scores despite high doses Ativan.  PCCM called for consideration precedex.   STUDIES:  CXR 3/28 >>> no acute process.  SIGNIFICANT EVENTS: 3/28 - admit 3/29 - transfer to ICU, started on precedex   SUBJECTIVE: RASS-3 on precedex gtt afebrile  VITAL SIGNS: Temp:  [97.2 F (36.2 C)-99 F (37.2 C)] 97.2 F (36.2 C) (03/30 0700) Pulse Rate:  [56-86] 58 (03/30 0844) Resp:  [17-25] 18 (03/30 0844) BP: (113-171)/(72-97) 143/83 mmHg (03/30 0800) SpO2:  [93 %-100 %] 100 % (03/30 0844) Weight:  [48.7 kg (107 lb 5.8 oz)] 48.7 kg (107 lb 5.8 oz) (03/29 2314) HEMODYNAMICS:   VENTILATOR SETTINGS:   INTAKE / OUTPUT: Intake/Output      03/29 0701 - 03/30 0700 03/30 0701 - 03/31 0700   P.O. 240    I.V. (mL/kg) 647.5 (13.3) 164 (3.4)   IV Piggyback 100    Total Intake(mL/kg) 987.5 (20.3) 164 (3.4)   Net +987.5 +164        Urine Occurrence 1 x      PHYSICAL EXAMINATION: General: Chronically ill appearing female, in NAD. Neuro: RASS-4,sedate, MAE's.  No focal deficits.  + Asterixis. HEENT: [place]/AT. PERRL, sclerae anicteric. Cardiovascular: RRR, no M/R/G.  Lungs: Respirations even and unlabored.  no wheeze bilaterally. Abdomen: BS x 4, soft, NT/ND.  Musculoskeletal: No gross deformities, no edema.  Skin: Intact, warm, no rashes.  LABS:  CBC  Recent Labs Lab 03/18/15 2109 03/18/15 2202 03/19/15 0505  WBC 9.2  --  4.2  HGB 12.3 16.7* 11.6*  HCT 35.2* 49.0* 33.6*  PLT 135*  --  130*   Coag's No results for input(s): APTT, INR in the last 168 hours. BMET  Recent Labs Lab 03/18/15 2109 03/18/15 2202 03/19/15 0505  NA 125* 125* 125*  K 3.8 3.8  4.5  CL 87* 87* 89*  CO2 24  --  23  BUN <5* 5* 9  CREATININE 0.67 0.80 0.75  GLUCOSE 130* 128* 160*   Electrolytes  Recent Labs Lab 03/18/15 2109 03/19/15 0505 03/20/15 0221  CALCIUM 8.3* 8.5  --   MG  --   --  2.3  PHOS  --   --  4.2   Sepsis Markers No results for input(s): LATICACIDVEN, PROCALCITON, O2SATVEN in the last 168 hours. ABG  Recent Labs Lab 03/18/15 2019  PHART 7.449  PCO2ART 32.0*  PO2ART 266.0*   Liver Enzymes  Recent Labs Lab 03/18/15 2109 03/19/15 0505  AST 120* 100*  ALT 49* 46*  ALKPHOS 92 86  BILITOT 1.1 1.2  ALBUMIN 3.9 3.7   Cardiac Enzymes No results for input(s): TROPONINI, PROBNP in the last 168 hours. Glucose  Recent Labs Lab 03/19/15 2342  GLUCAP 130*    Imaging No results found.   ASSESSMENT / PLAN:  NEUROLOGIC A:   Korsakoff syndrome / encephalopathy ETOH abuse with withdrawal and concern for early DT's - did not respond well to multiple doses of Ativan At risk ETOH withdrawal seizures Hx Anxiety, insomnia P:   Decrease precedex infusion , taper using Ativan PRN. Continue thiamine / folate / multivitamin. Seizure precautions. Hold outpatient cymbalta, norco, percocet.  PULMONARY A: AECOPD Tobacco use  disorder Protecting airway on own currently P:   Continue BD's,abx (On levaquin 3/5). Drop solumedrol Nicotine patch. Smoking cessation counseling. At risk intubation if deteriorates.  CARDIOVASCULAR A:  No acute issues P:  Monitor hemodynamics.  RENAL A:   Hyponatremia likely due to beer potomania (Serum Osm 273) P:   NS @ 75. BMP in AM.  GASTROINTESTINAL A:   Transaminitis with AST > ALT c/w ETOH abuse.  Doubt hepatitis given level of increased LFTs. Hep C Ab + Nutrition P:   NPO for now given concern for airway compromise.  HEMATOLOGIC A:   Macrocytic anemia - liely due to ETOH. Folate, B12, TSH normal. Thrombocytopenia - likely due to ETOH VTE Prophylaxis P:  Transfuse per usual  ICU guidelines. SCD's / Lovenox. CBC in AM.  INFECTIOUS A:   AECOPD P:   Abx: Levaquin, start date 3/29>>  ENDOCRINE A:   Hyperglycemia  P:   SSI if glucose consistently > 180.    Family updated: None.  Interdisciplinary Family Meeting v Palliative Care Meeting:  Due by: 4/4.  The patient is critically ill with multiple organ systems failure and requires high complexity decision making for assessment and support, frequent evaluation and titration of therapies, application of advanced monitoring technologies and extensive interpretation of multiple databases. Critical Care Time devoted to patient care services described in this note independent of APP time is 31 minutes.    Oretha MilchALVA,Juanya Villavicencio V. MD  03/20/2015, 9:24 AM

## 2015-03-21 ENCOUNTER — Inpatient Hospital Stay (HOSPITAL_COMMUNITY): Payer: Self-pay

## 2015-03-21 LAB — BASIC METABOLIC PANEL
Anion gap: 7 (ref 5–15)
BUN: 13 mg/dL (ref 6–23)
CALCIUM: 8 mg/dL — AB (ref 8.4–10.5)
CO2: 24 mmol/L (ref 19–32)
Chloride: 97 mmol/L (ref 96–112)
Creatinine, Ser: 0.52 mg/dL (ref 0.50–1.10)
GFR calc Af Amer: >90 mL/min (ref >90)
Glucose, Bld: 104 mg/dL — ABNORMAL HIGH (ref 70–99)
Potassium: 3.8 mmol/L (ref 3.5–5.1)
SODIUM: 128 mmol/L — AB (ref 135–145)

## 2015-03-21 LAB — CBC
HCT: 30.7 % — ABNORMAL LOW (ref 36.0–46.0)
Hemoglobin: 10.3 g/dL — ABNORMAL LOW (ref 12.0–15.0)
MCH: 36.3 pg — ABNORMAL HIGH (ref 26.0–34.0)
MCHC: 33.6 g/dL (ref 30.0–36.0)
MCV: 108.1 fL — ABNORMAL HIGH (ref 78.0–100.0)
Platelets: 135 10*3/uL — ABNORMAL LOW (ref 150–400)
RBC: 2.84 MIL/uL — ABNORMAL LOW (ref 3.87–5.11)
RDW: 12.8 % (ref 11.5–15.5)
WBC: 8.3 10*3/uL (ref 4.0–10.5)

## 2015-03-21 LAB — PHOSPHORUS: Phosphorus: 2.4 mg/dL (ref 2.3–4.6)

## 2015-03-21 LAB — MAGNESIUM: MAGNESIUM: 1.8 mg/dL (ref 1.5–2.5)

## 2015-03-21 MED ORDER — ENOXAPARIN SODIUM 30 MG/0.3ML ~~LOC~~ SOLN
30.0000 mg | SUBCUTANEOUS | Status: DC
  Administered 2015-03-21: 30 mg via SUBCUTANEOUS
  Filled 2015-03-21 (×2): qty 0.3

## 2015-03-21 MED ORDER — LEVOFLOXACIN 500 MG PO TABS
500.0000 mg | ORAL_TABLET | Freq: Every day | ORAL | Status: DC
  Administered 2015-03-21 – 2015-03-22 (×2): 500 mg via ORAL
  Filled 2015-03-21 (×2): qty 1

## 2015-03-21 NOTE — Progress Notes (Signed)
   03/21/15 1100  What Happened  Was fall witnessed? No  Was patient injured? Yes (fall on right hip )  Patient found on floor  Found by Staff-comment Conservator, museum/gallery(Amber Greeson )  Stated prior activity other (comment) (in bed )  Follow Up  MD notified Vassie LollAlva   Time MD notified [phone]  Family notified Yes-comment (attempted to call significant other, no answer to both phone)  Time family notified 1123  Additional tests Yes-comment (xray of right hip )  Adult Fall Risk Assessment  Risk Factor Category (scoring not indicated) Fall has occurred during this admission (document High fall risk)  Patient's Fall Risk High Fall Risk (>13 points)  Adult Fall Risk Interventions  Required Bundle Interventions *See Row Information* High fall risk - low, moderate, and high requirements implemented  Additional Interventions Secure all tubes/drains;Individualized elimination schedule;Room near nurses station   Bed alarm was in place when fall occurred but not ringing out to nurses desk. Pt had been informed to call staff if she need to get up when admitted to the unit. Pt currently has a sitter at the bedside. Pt was already assessed as a high fall risk prior to transfer to [facility] due to multiple falls at homes. Pt already had generalized brusing and scabs due to prior falls. No new injuries noted. Xray pf the Right hip ordered due to pt falling on it, but denies injury to head and none noted on the falls camera.

## 2015-03-21 NOTE — Progress Notes (Signed)
Rhonda Mitchell [id number]  Transfer Data: 03/21/2015 10:16 AM  Attending Provider: Alyson ReedyWesam G Yacoub, MD  WNU:UVOZDPCP:NNODI, [name], MD  Code Status: Full  Rhonda Mitchell is a 54 y.o. female patient transferred from 17[facility]  -No acute distress noted.  -No complaints of shortness of breath.  -No complaints of chest pain.  Cardiac Monitoring:  Box # 17 in place.  Cardiac monitor yields:normal sinus rhythm.  Blood pressure 128/96, pulse 85, temperature 98.2 F (36.8 C), temperature source Oral, resp. rate 16, height 5\' 1"  (1.549 m), weight 48.7 kg (107 lb 5.8 oz), SpO2 100 %.  ?   Allergies: Review of patient's allergies indicates no known allergies.  Past Medical History:  has a past medical history of COPD (chronic obstructive pulmonary disease); Anxiety; Shortness of breath; Arthritis; GERD (gastroesophageal reflux disease); Full dentures; Insomnia; and Alcohol abuse.  Past Surgical History:  has past surgical history that includes arm surgery; Hand surgery (1990); Shoulder surgery; ORIF ulnar / radial shaft fracture (1990); and ORIF ulnar fracture (Left, 05/23/2014).  Social History:  reports that she has been smoking Cigarettes.  She has a 20 pack-year smoking history. She has never used smokeless tobacco. She reports that she drinks about 1.8 oz of alcohol per week. She reports that she does not use illicit drugs.   Patient orientated to room. Information packet given to patient/family. Admission inpatient armband information verified with patient/family to include name and date of birth and placed on patient arm. Side rails up x 2, fall assessment and education completed with patient/family.Call light within reach. Patient able to voice and demonstrate understanding of unit orientation instructions.  Will continue to evaluate and treat per MD orders.

## 2015-03-21 NOTE — Progress Notes (Signed)
Attempted to call report to 5W. Nurse not available. Will call back in 20 min.

## 2015-03-21 NOTE — Progress Notes (Addendum)
PULMONARY / CRITICAL CARE MEDICINE   Name: Sherrilee GillesMichelle Dauria MRN: [id number] DOB: Jun 28, 1961    ADMISSION DATE:  03/18/2015 CONSULTATION DATE:  03/21/2015  REFERRING MD :  IM (Short)  CHIEF COMPLAINT:  ETOH withdrawal  INITIAL PRESENTATION:  [age] y.o. F admitted 3/28 for AECOPD.  On 3/29, had ETOH withdrawal with high CIWA scores despite high doses Ativan.  PCCM called for consideration precedex.   STUDIES:  CXR 3/28 >>> no acute process.  SIGNIFICANT EVENTS: 3/28 - admit 3/29 - transfer to ICU, started on precedex   SUBJECTIVE: Off precedex gtt Afebrile Taking PO Required 2 doses of ativan overnight  VITAL SIGNS: Temp:  [98 F (36.7 C)-98.3 F (36.8 C)] 98.1 F (36.7 C) (03/31 0700) Pulse Rate:  [58-99] 90 (03/31 0700) Resp:  [14-28] 14 (03/31 0700) BP: (103-171)/(64-108) 130/77 mmHg (03/31 0700) SpO2:  [91 %-100 %] 97 % (03/31 0700) HEMODYNAMICS:   VENTILATOR SETTINGS:   INTAKE / OUTPUT: Intake/Output      03/30 0701 - 03/31 0700 03/31 0701 - 04/01 0700   P.O.     I.V. (mL/kg) 1905.2 (39.1)    IV Piggyback 100    Total Intake(mL/kg) 2005.2 (41.2)    Urine (mL/kg/hr) 500 (0.4)    Total Output 500     Net +1505.2          Urine Occurrence 1 x      PHYSICAL EXAMINATION: General: Chronically ill appearing female, in NAD. Neuro: RASS 0,sedate, MAE's.  No focal deficits.  + Asterixis. HEENT: [place]/AT. PERRL, sclerae anicteric. Cardiovascular: RRR, no M/R/G.  Lungs: Respirations even and unlabored.  no wheeze bilaterally. Abdomen: BS x 4, soft, NT/ND.  Musculoskeletal: No gross deformities, no edema, staples over rt elbow Skin: Intact, warm, no rashes.  LABS:  CBC  Recent Labs Lab 03/18/15 2109 03/18/15 2202 03/19/15 0505 03/21/15 0450  WBC 9.2  --  4.2 8.3  HGB 12.3 16.7* 11.6* 10.3*  HCT 35.2* 49.0* 33.6* 30.7*  PLT 135*  --  130* 135*   Coag's No results for input(s): APTT, INR in the last 168 hours. BMET  Recent Labs Lab 03/18/15 2109  03/18/15 2202 03/19/15 0505 03/21/15 0450  NA 125* 125* 125* 128*  K 3.8 3.8 4.5 3.8  CL 87* 87* 89* 97  CO2 24  --  23 24  BUN <5* 5* 9 13  CREATININE 0.67 0.80 0.75 0.52  GLUCOSE 130* 128* 160* 104*   Electrolytes  Recent Labs Lab 03/18/15 2109 03/19/15 0505 03/20/15 0221 03/21/15 0450  CALCIUM 8.3* 8.5  --  8.0*  MG  --   --  2.3 1.8  PHOS  --   --  4.2 2.4   Sepsis Markers No results for input(s): LATICACIDVEN, PROCALCITON, O2SATVEN in the last 168 hours. ABG  Recent Labs Lab 03/18/15 2019  PHART 7.449  PCO2ART 32.0*  PO2ART 266.0*   Liver Enzymes  Recent Labs Lab 03/18/15 2109 03/19/15 0505  AST 120* 100*  ALT 49* 46*  ALKPHOS 92 86  BILITOT 1.1 1.2  ALBUMIN 3.9 3.7   Cardiac Enzymes No results for input(s): TROPONINI, PROBNP in the last 168 hours. Glucose  Recent Labs Lab 03/19/15 2342  GLUCAP 130*    Imaging No results found.   ASSESSMENT / PLAN:  NEUROLOGIC A:   Korsakoff syndrome / encephalopathy ETOH abuse with withdrawal and concern for early DT's - did not respond well to multiple doses of Ativan At risk ETOH withdrawal seizures Hx Anxiety, insomnia P:  dc precedex infusion ,  Ativan PRN CIWA protocol. Continue thiamine / folate / multivitamin. Seizure precautions. Hold outpatient cymbalta, norco, percocet.  PULMONARY A: AECOPD Tobacco use disorder Protecting airway on own currently P:   Continue BD's,abx (On levaquin 4/5). Dc solumedrol Nicotine patch. Smoking cessation counseling.  CARDIOVASCULAR A:  No acute issues P:  Monitor hemodynamics.  RENAL A:   Hyponatremia likely due to beer potomania (Serum Osm 273) P:   NS @ 50. BMP in AM.  GASTROINTESTINAL A:   Transaminitis with AST > ALT c/w ETOH abuse.  Doubt hepatitis given level of increased LFTs. Hep C Ab +  P:   Advance PO  HEMATOLOGIC A:   Macrocytic anemia - liely due to ETOH. Folate, B12, TSH normal. Thrombocytopenia - likely due to  ETOH VTE Prophylaxis P:  Transfuse per usual ICU guidelines. SCD's / Lovenox. CBC in AM.  INFECTIOUS A:   AECOPD P:   Abx: Levaquin, start date 3/29>>  ENDOCRINE A:   Hyperglycemia  P:   SSI if glucose consistently > 180.    Family updated: None.  Interdisciplinary Family Meeting v Palliative Care Meeting:  NA  Transfer to floor & [facility] 4/1 , staple removal fro rt elbow by 4/3   Oretha Milch. MD  03/21/2015, 8:04 AM

## 2015-03-21 NOTE — Progress Notes (Signed)
Attempted to call report to 5W. Nurse still not available. Will call back in 5 min.

## 2015-03-21 NOTE — Progress Notes (Signed)
Called 5W to give report. Secretary said nurse was in [employer]'s meeting and could not be reached and that nurse would call me back when she was available

## 2015-03-21 NOTE — Progress Notes (Signed)
Pt suffered a fall bed alarm was in place and pt currently in a camera room. Vassie LollAlva MD made aware. Order for Right Hip xray to be placed.

## 2015-03-21 NOTE — Progress Notes (Signed)
   03/21/15 1100  Vitals  BP (!) 141/90 mmHg  MAP (mmHg) 102  BP Location Right Arm  BP Method Automatic  Patient Position (if appropriate) Lying  Pulse Rate 82  Pulse Rate Source Monitor  Resp 18  Vitals after fall occurred.

## 2015-03-21 NOTE — Progress Notes (Signed)
Report called to 2M 

## 2015-03-22 LAB — BASIC METABOLIC PANEL
Anion gap: 4 — ABNORMAL LOW (ref 5–15)
BUN: 9 mg/dL (ref 6–23)
CALCIUM: 7.8 mg/dL — AB (ref 8.4–10.5)
CO2: 29 mmol/L (ref 19–32)
Chloride: 97 mmol/L (ref 96–112)
Creatinine, Ser: 0.49 mg/dL — ABNORMAL LOW (ref 0.50–1.10)
GFR calc Af Amer: >90 mL/min (ref >90)
Glucose, Bld: 119 mg/dL — ABNORMAL HIGH (ref 70–99)
Potassium: 3 mmol/L — ABNORMAL LOW (ref 3.5–5.1)
Sodium: 130 mmol/L — ABNORMAL LOW (ref 135–145)

## 2015-03-22 LAB — OSMOLALITY: OSMOLALITY: 269 mosm/kg — AB (ref 275–300)

## 2015-03-22 LAB — SODIUM, URINE, RANDOM: Sodium, Ur: 108 mmol/L

## 2015-03-22 LAB — CREATININE, URINE, RANDOM: Creatinine, Urine: 93.2 mg/dL

## 2015-03-22 LAB — OSMOLALITY, URINE: Osmolality, Ur: 549 mOsm/kg (ref 390–1090)

## 2015-03-22 LAB — MAGNESIUM: MAGNESIUM: 1.6 mg/dL (ref 1.5–2.5)

## 2015-03-22 MED ORDER — LORAZEPAM 1 MG PO TABS
1.0000 mg | ORAL_TABLET | Freq: Four times a day (QID) | ORAL | Status: AC | PRN
  Administered 2015-03-22 – 2015-03-24 (×3): 1 mg via ORAL
  Filled 2015-03-22 (×4): qty 1

## 2015-03-22 MED ORDER — IPRATROPIUM-ALBUTEROL 0.5-2.5 (3) MG/3ML IN SOLN
3.0000 mL | Freq: Two times a day (BID) | RESPIRATORY_TRACT | Status: DC
  Administered 2015-03-22 – 2015-03-25 (×6): 3 mL via RESPIRATORY_TRACT
  Filled 2015-03-22 (×6): qty 3

## 2015-03-22 MED ORDER — ENOXAPARIN SODIUM 40 MG/0.4ML ~~LOC~~ SOLN
40.0000 mg | SUBCUTANEOUS | Status: DC
  Administered 2015-03-22 – 2015-03-25 (×4): 40 mg via SUBCUTANEOUS
  Filled 2015-03-22 (×4): qty 0.4

## 2015-03-22 MED ORDER — MAGNESIUM SULFATE IN D5W 10-5 MG/ML-% IV SOLN
1.0000 g | Freq: Once | INTRAVENOUS | Status: AC
  Administered 2015-03-22: 1 g via INTRAVENOUS
  Filled 2015-03-22: qty 100

## 2015-03-22 MED ORDER — ADULT MULTIVITAMIN W/MINERALS CH
1.0000 | ORAL_TABLET | Freq: Every day | ORAL | Status: DC
  Administered 2015-03-22 – 2015-03-25 (×5): 1 via ORAL
  Filled 2015-03-22 (×4): qty 1

## 2015-03-22 MED ORDER — VITAMIN B-1 100 MG PO TABS
100.0000 mg | ORAL_TABLET | Freq: Every day | ORAL | Status: DC
  Administered 2015-03-22 – 2015-03-25 (×5): 100 mg via ORAL
  Filled 2015-03-22 (×4): qty 1

## 2015-03-22 MED ORDER — POTASSIUM CHLORIDE CRYS ER 20 MEQ PO TBCR
40.0000 meq | EXTENDED_RELEASE_TABLET | ORAL | Status: AC
  Administered 2015-03-22 (×2): 40 meq via ORAL
  Filled 2015-03-22 (×2): qty 2

## 2015-03-22 MED ORDER — THIAMINE HCL 100 MG/ML IJ SOLN
100.0000 mg | Freq: Every day | INTRAMUSCULAR | Status: DC
  Filled 2015-03-22 (×2): qty 1

## 2015-03-22 MED ORDER — LORAZEPAM 2 MG/ML IJ SOLN
1.0000 mg | Freq: Four times a day (QID) | INTRAMUSCULAR | Status: AC | PRN
  Administered 2015-03-23 – 2015-03-25 (×4): 1 mg via INTRAVENOUS
  Filled 2015-03-22 (×4): qty 1

## 2015-03-22 MED ORDER — LORAZEPAM 2 MG/ML IJ SOLN
0.0000 mg | Freq: Four times a day (QID) | INTRAMUSCULAR | Status: AC
Start: 2015-03-22 — End: 2015-03-24

## 2015-03-22 MED ORDER — LORAZEPAM 2 MG/ML IJ SOLN: 0.0000 mg | Freq: Two times a day (BID) | INTRAMUSCULAR | Status: DC

## 2015-03-22 MED ORDER — CHLORDIAZEPOXIDE HCL 5 MG PO CAPS
10.0000 mg | ORAL_CAPSULE | Freq: Three times a day (TID) | ORAL | Status: DC
  Administered 2015-03-22 – 2015-03-23 (×3): 10 mg via ORAL
  Filled 2015-03-22 (×4): qty 2

## 2015-03-22 MED ORDER — FOLIC ACID 1 MG PO TABS
1.0000 mg | ORAL_TABLET | Freq: Every day | ORAL | Status: DC
  Administered 2015-03-22 – 2015-03-25 (×4): 1 mg via ORAL
  Filled 2015-03-22 (×4): qty 1

## 2015-03-22 NOTE — Clinical Social Work Psychosocial (Signed)
Venita LickJoseph B Laverne Hursey, LCSW Social Worker Signed Clinical Social Work Clinical Social Work Psychosocial 03/22/2015 3:01 PM    Expand All Collapse All   Clinical Social Work Department BRIEF PSYCHOSOCIAL ASSESSMENT 03/22/2015  Patient: Rhonda Mitchell,Rhonda Mitchell Account Number: [id number] Admit date: 03/18/2015  Clinical Social Worker: Lavell LusterAMPBELL,Fabiha Rougeau BRYANT, LCSWA Date/Time: 03/22/2015 02:42 PM  Referred by: Physician Date Referred: 03/22/2015 Referred for  SNF Placement   Other Referral:  NA   Interview type: Other - See comment Other interview type:  Patient's significant other Lavena BullionJames Kellam interviewed to complete assessment.    PSYCHOSOCIAL DATA Living Status: SIGNIFICANT OTHER Admitted from facility:  Level of care:  Primary support name: Lavena BullionJames Kellam Primary support relationship to patient: NONE Degree of support available:  Support is limited.    CURRENT CONCERNS Current Concerns  Post-Acute Placement   Other Concerns:  NA    SOCIAL WORK ASSESSMENT / PLAN CSW spoke with patiet's significant other by phone to complete assessment. CSW received call from Lavena BullionJames Kellam regarding concern for the patient's discharge plan. Per Fayrene FearingJames the patient is a severe alcoholic. He states the patient drinks about a fifth of liqour per day and has been doing this for 3 years. Prior to her alcohol use, the patient used cocaine and heroin for many years. Fayrene FearingJames is very concerned for the patient and fears that she will die if she returns home as she will continue to drink. Fayrene FearingJames states he is a Naval architecttruck driver and is not always home with the patient. In addition to drinking a lot, the patient has also been experiencing frequent falls at home along with "shakes".    Fayrene FearingJames reports that the patient has lost two brothers to heroin overdose. One of her brothers is still alive and currently lives in [place]. James requests assistance with having the patient placed in an  "alcohol rehab". CSW explained that patient will likely be recommended for SNF placement at discharge and that substance abuse treatment facilities will usually not take patient's if they require the amount of assistance the patient is currently needed. Fayrene FearingJames is agreeable to placement in a SNF if this is needed at discharge. CSW explained that placement will likely be very difficult for patient given her excessive ETOH use, and her lack of insurance. CSW explained LOG placement process and how patient will likely [facility]. Fayrene FearingJames is agreeable to this and states, "We need to do whatever is going to save her life." It's clear that Fayrene FearingJames is overwhelmed by the patient's excessive ETOH use and is not sure how to help the patient. CSW will wait for PT/OT recommendations and will speak with patient if/when she becomes less disoriented.   Assessment/plan status: Psychosocial Support/Ongoing Assessment of Needs Other assessment/ plan:  COmplete FL2, Fax, PASRR   Information/referral to community resources:  CSW contact information given.    PATIENT'S/FAMILY'S RESPONSE TO PLAN OF CARE: Patient's significant other Fayrene FearingJames would like for the patient to DC to a facility, either SNF or ETOH rehab at discharge so that she will be in a monitored setting. Fayrene FearingJames is apprehensive about patient ruturning home as he believes she will die from continued alcohol use. Fayrene FearingJames does state that the patient does have a history of leaving the hospital AMA. CSW will continue to follow.       Roddie McBryant Blayke Pinera MSW, [place]CSWA, [place], [phone]            Roddie McBryant Samnang Shugars MSW, [place], [place], [phone]

## 2015-03-22 NOTE — Progress Notes (Signed)
SLP Cancellation Note  Patient Details Name: Sherrilee GillesMichelle Bjelland MRN: [id number] DOB: 23-Jan-1961   Cancelled treatment:       Reason Eval/Treat Not Completed: Fatigue/lethargy limiting ability to participate. Pt given Ativan, sleeping.    Ryhanna Dunsmore, Riley NearingBonnie Caroline 03/22/2015, 1:38 PM

## 2015-03-22 NOTE — Progress Notes (Signed)
Patient Demographics  Rhonda Mitchell, is a 54 y.o. female, DOB - September 13, 1961, ZOX:[id number]  Admit date - 03/18/2015   Admitting Physician Pearson Grippe, MD  Outpatient Primary MD for the patient is NNODI, Alesia Richards, MD  LOS - 4   Chief Complaint  Patient presents with  . Respiratory Distress      INITIAL PRESENTATION: 54 y.o. F admitted 3/28 for AECOPD. On 3/29, had ETOH withdrawal with high CIWA scores despite high doses Ativan. PCCM called for consideration precedex. She was stabilized and transferred to hospitalist service on 03/22/2015 on day 4 of her hospital stay.   Subjective:   Rhonda Mitchell today has, No headache, No chest pain, No abdominal pain - No Nausea, No new weakness tingling or numbness, No Cough - SOB.    Assessment & Plan    1. Alcohol intoxication and now withdrawal. Counseled to quit, appears to be an early DTs, continue bedside sitter, initiate CIWA protocol-add scheduled Librium. Feeding assistance and aspiration precautions. We will have speech evaluate as well.   2. COPD. And smoking. Counseled to quit. Neck and him patch, no wheezing, supportive care with nebulizers and oxygen as needed.   3. Hypokalemia. Replaced and monitor.   4. Hyponatremia. Etiology unclear, urine sodium was greater than 73 and ICU, will repeat urine sodium-urine osmolality-serum osmolality, hold IV fluids for now. Repeat BMP in the morning. This could be [facility].     Code Status: full  Family Communication: none  Disposition Plan: TBD, PT eval   Procedures      Consults  Speech, PT, PCCM   Medications  Scheduled Meds: . arformoterol  15 mcg Nebulization BID  . budesonide (PULMICORT) nebulizer solution  0.5 mg Nebulization BID  . Chlorhexidine Gluconate Cloth   6 each Topical Q0600  . enoxaparin (LOVENOX) injection  40 mg Subcutaneous Q24H  . ipratropium-albuterol  3 mL Nebulization BID  . levofloxacin  500 mg Oral Daily  . multivitamin with minerals  1 tablet Oral Daily  . mupirocin ointment  1 application Nasal BID  . nicotine  21 mg Transdermal Daily  . potassium chloride  40 mEq Oral Q4H  . sodium chloride  10-40 mL Intracatheter Q12H  . sodium chloride  3 mL Intravenous Q12H  . thiamine  100 mg Oral Daily   Or  . thiamine  100 mg Intravenous Daily   Continuous Infusions:  PRN Meds:.acetaminophen **OR** acetaminophen, albuterol, HYDROcodone-homatropine, LORazepam, sodium chloride  DVT Prophylaxis  Lovenox    Lab Results  Component Value Date   PLT 135* 03/21/2015    Antibiotics     Anti-infectives    Start     Dose/Rate Route Frequency Ordered Stop   03/21/15 1000  levofloxacin (LEVAQUIN) tablet 500 mg     500 mg Oral Daily 03/21/15 0810 03/23/15 0959   03/18/15 2345  levofloxacin (LEVAQUIN) IVPB 500 mg  Status:  Discontinued     500 mg 100 mL/hr over 60 Minutes Intravenous Every 24 hours 03/18/15 2342 03/21/15 0810          Objective:   Filed Vitals:   03/21/15 1321 03/21/15 1403 03/21/15 2232 03/22/15 0615  BP: 154/95  137/86 125/78  Pulse: 87  91 84  Temp: 98.5 F (36.9  C)  98.2 F (36.8 C) 97.5 F (36.4 C)  TempSrc: Axillary  Oral Oral  Resp: 18  18   Height:      Weight:      SpO2: 96% 96% 100% 94%    Wt Readings from Last 3 Encounters:  03/19/15 48.7 kg (107 lb 5.8 oz)  01/03/15 44.453 kg (98 lb)  12/09/14 47.2 kg (104 lb 0.9 oz)     Intake/Output Summary (Last 24 hours) at 03/22/15 1119 Last data filed at 03/22/15 0914  Gross per 24 hour  Intake  742.5 ml  Output    400 ml  Net  342.5 ml     Physical Exam  Awake , mildly confused, No new F.N deficits, Normal affect [place].AT,PERRAL Supple Neck,No JVD, No cervical lymphadenopathy appriciated.  Symmetrical Chest wall movement, Good air  movement bilaterally, CTAB RRR,No Gallops,Rubs or new Murmurs, No Parasternal Heave +ve B.Sounds, Abd Soft, No tenderness, No organomegaly appriciated, No rebound - guarding or rigidity. No Cyanosis, Clubbing or edema, No new Rash or bruise      Data Review   Micro Results Recent Results (from the past 240 hour(s))  MRSA PCR Screening     Status: Abnormal   Collection Time: 03/19/15 12:48 PM  Result Value Ref Range Status   MRSA by PCR POSITIVE (A) NEGATIVE Final    Comment:        The GeneXpert MRSA Assay (FDA approved for NASAL specimens only), is one component of a comprehensive MRSA colonization surveillance program. It is not intended to diagnose MRSA infection nor to guide or monitor treatment for MRSA infections. RESULT CALLED TO, READ BACK BY AND VERIFIED WITH: W. DAVIS RN 15:20 03/19/15 ([email or web address])     Radiology Reports Dg Elbow Complete Right  03/15/2015   CLINICAL DATA:  Fall from standing position tonight, now with right elbow pain. Right arm laceration.  EXAM: RIGHT ELBOW - COMPLETE 3+ VIEW  COMPARISON:  None.  FINDINGS: No fracture or dislocation. The alignment and joint spaces are maintained. Mild degenerative type olecranon spurring. Soft tissue edema noted about the medial elbow with question laceration. No radiopaque foreign body. No elbow joint effusion.  IMPRESSION: Soft tissue injury. No radiopaque foreign body. No acute fracture or dislocation.   Electronically Signed   By: Rubye OaksMelanie  Ehinger M.D.   On: 03/15/2015 23:49   Ct Head Wo Contrast  03/15/2015   CLINICAL DATA:  Fall right eyebrow laceration  EXAM: CT HEAD WITHOUT CONTRAST  TECHNIQUE: Contiguous axial images were obtained from the base of the skull through the vertex without intravenous contrast.  COMPARISON:  04/23/2014  FINDINGS: No skull fracture is noted. There is moderate cerebral atrophy. Bilateral symmetrical mild prominent of extra-axial space probable due to atrophy. There is mild scalp swelling in  right frontal region see axial image 15. Mild right periorbital soft tissue swelling and subcutaneous stranding. Question laceration/skin irregularity head right lateral periorbital.  No intracranial hemorrhage, mass effect or midline shift. No hydrocephalus. The gray and white-matter differentiation is preserved. No mass lesion is noted on this unenhanced scan.  IMPRESSION: No acute intracranial abnormality. Moderate cerebral atrophy. Mild scalp swelling in right frontal region. There is soft tissue swelling subcutaneous stranding and skin irregularity in right periorbital region. Clinical correlation is necessary.   Electronically Signed   By: Natasha MeadLiviu  Pop M.D.   On: 03/15/2015 22:41   Ct Cervical Spine Wo Contrast  03/16/2015   CLINICAL DATA:  Initial valuation for acute trauma, fall from standing.  EXAM: CT CERVICAL SPINE WITHOUT CONTRAST  TECHNIQUE: Multidetector CT imaging of the cervical spine was performed without intravenous contrast. Multiplanar CT image reconstructions were also generated.  COMPARISON:  None.  FINDINGS: There is trace anterior listhesis of C3 on C4 and C6 on C7, likely chronic in nature. Otherwise, vertebral bodies are normally aligned with preservation of the normal cervical lordosis. Vertebral body heights maintained. Prevertebral soft tissues normal. Normal C1-2 articulations intact. No acute fracture or listhesis.  Advanced degenerative disc disease is evidenced by intervertebral disc space narrowing, endplate sclerosis, and osteophytosis present at C5-6. Prominent degenerative changes present about the anterior C1-2 articulation. Multilevel facet arthropathy present throughout the cervical spine, greatest on the right.  No soft tissue abnormality within the neck.  No apical pneumothorax.  IMPRESSION: 1. No acute traumatic injury within the cervical spine. 2. Advanced multilevel degenerative disc disease and facet arthropathy, most severe at C5-6.   Electronically Signed   By:  Rise Mu M.D.   On: 03/16/2015 00:01   Dg Chest Portable 1 View  03/18/2015   CLINICAL DATA:  Shortness of breath.  EXAM: PORTABLE CHEST - 1 VIEW  COMPARISON:  12/07/2014  FINDINGS: Artifact overlies the chest. Heart size is normal. Mediastinal shadows are normal. The lungs are clear. No effusions. No bony abnormalities.  IMPRESSION: No active disease.   Electronically Signed   By: Paulina Fusi M.D.   On: 03/18/2015 21:04   Dg Hip Unilat With Pelvis 2-3 Views Right  03/21/2015   CLINICAL DATA:  54 year old female who fell in hospital room. Right hip pain. Initial encounter.  EXAM: RIGHT HIP (WITH PELVIS) 2-3 VIEWS  COMPARISON:  CT Abdomen and Pelvis 04/23/2014.  FINDINGS: Bone mineralization is within normal limits. Femoral heads normally located. Pelvis intact. SI joints within normal limits. Proximal left femur grossly intact.  No proximal right femur fracture identified.  IMPRESSION: No acute fracture or dislocation identified about the right hip or pelvis.   Electronically Signed   By: Odessa Fleming M.D.   On: 03/21/2015 13:08   Dg Hip Unilat With Pelvis 2-3 Views Right  03/15/2015   CLINICAL DATA:  Fall from standing position.  Pain.  EXAM: RIGHT HIP (WITH PELVIS) 2-3 VIEWS  COMPARISON:  None.  FINDINGS: No acute bony abnormality. Specifically, no fracture, subluxation, or dislocation. Soft tissues are intact. SI joints and hip joints are symmetric and unremarkable.  IMPRESSION: No acute bony abnormality.   Electronically Signed   By: Charlett Nose M.D.   On: 03/15/2015 23:49     CBC  Recent Labs Lab 03/18/15 2109 03/18/15 2202 03/19/15 0505 03/21/15 0450  WBC 9.2  --  4.2 8.3  HGB 12.3 16.7* 11.6* 10.3*  HCT 35.2* 49.0* 33.6* 30.7*  PLT 135*  --  130* 135*  MCV 103.5*  --  106.0* 108.1*  MCH 36.2*  --  36.6* 36.3*  MCHC 34.9  --  34.5 33.6  RDW 12.9  --  13.1 12.8    Chemistries   Recent Labs Lab 03/18/15 2109 03/18/15 2202 03/19/15 0505 03/20/15 0221 03/21/15 0450  03/22/15 0530  NA 125* 125* 125*  --  128* 130*  K 3.8 3.8 4.5  --  3.8 3.0*  CL 87* 87* 89*  --  97 97  CO2 24  --  23  --  24 29  GLUCOSE 130* 128* 160*  --  104* 119*  BUN <5* 5* 9  --  13 9  CREATININE 0.67 0.80 0.75  --  0.52 0.49*  CALCIUM 8.3*  --  8.5  --  8.0* 7.8*  MG  --   --   --  2.3 1.8 1.6  AST 120*  --  100*  --   --   --   ALT 49*  --  46*  --   --   --   ALKPHOS 92  --  86  --   --   --   BILITOT 1.1  --  1.2  --   --   --    ------------------------------------------------------------------------------------------------------------------ estimated creatinine clearance is 58.4 mL/min (by C-G formula based on Cr of 0.49). ------------------------------------------------------------------------------------------------------------------ No results for input(s): HGBA1C in the last 72 hours. ------------------------------------------------------------------------------------------------------------------ No results for input(s): CHOL, HDL, LDLCALC, TRIG, CHOLHDL, LDLDIRECT in the last 72 hours. ------------------------------------------------------------------------------------------------------------------ No results for input(s): TSH, T4TOTAL, T3FREE, THYROIDAB in the last 72 hours.  Invalid input(s): FREET3 ------------------------------------------------------------------------------------------------------------------ No results for input(s): VITAMINB12, FOLATE, FERRITIN, TIBC, IRON, RETICCTPCT in the last 72 hours.  Coagulation profile No results for input(s): INR, PROTIME in the last 168 hours.  No results for input(s): DDIMER in the last 72 hours.  Cardiac Enzymes No results for input(s): CKMB, TROPONINI, MYOGLOBIN in the last 168 hours.  Invalid input(s): CK ------------------------------------------------------------------------------------------------------------------ Invalid input(s): POCBNP     Time Spent in minutes  35   SINGH,PRASHANT K M.D  on 03/22/2015 at 11:19 AM  Between 7am to 7pm - Pager - [phone]  After 7pm go to [email or web address] - password [facility]  [facility]   Office  [phone]

## 2015-03-23 LAB — MAGNESIUM: Magnesium: 2 mg/dL (ref 1.5–2.5)

## 2015-03-23 LAB — BASIC METABOLIC PANEL
Anion gap: 3 — ABNORMAL LOW (ref 5–15)
BUN: 9 mg/dL (ref 6–23)
CO2: 28 mmol/L (ref 19–32)
Calcium: 8.6 mg/dL (ref 8.4–10.5)
Chloride: 100 mmol/L (ref 96–112)
Creatinine, Ser: 0.51 mg/dL (ref 0.50–1.10)
GFR calc Af Amer: >90 mL/min (ref >90)
GFR calc non Af Amer: >90 mL/min (ref >90)
Glucose, Bld: 111 mg/dL — ABNORMAL HIGH (ref 70–99)
POTASSIUM: 4.1 mmol/L (ref 3.5–5.1)
Sodium: 131 mmol/L — ABNORMAL LOW (ref 135–145)

## 2015-03-23 MED ORDER — DOXYCYCLINE HYCLATE 100 MG PO TABS
100.0000 mg | ORAL_TABLET | Freq: Two times a day (BID) | ORAL | Status: DC
  Administered 2015-03-23 – 2015-03-25 (×5): 100 mg via ORAL
  Filled 2015-03-23 (×7): qty 1

## 2015-03-23 MED ORDER — DOXYCYCLINE HYCLATE 100 MG PO TABS
100.0000 mg | ORAL_TABLET | Freq: Two times a day (BID) | ORAL | Status: DC
  Filled 2015-03-23: qty 1

## 2015-03-23 MED ORDER — CHLORDIAZEPOXIDE HCL 5 MG PO CAPS
5.0000 mg | ORAL_CAPSULE | Freq: Three times a day (TID) | ORAL | Status: DC
  Administered 2015-03-23 – 2015-03-24 (×2): 5 mg via ORAL
  Filled 2015-03-23 (×2): qty 1

## 2015-03-23 NOTE — Evaluation (Signed)
Clinical/Bedside Swallow Evaluation Patient Details  Name: Rhonda Mitchell MRN: [id number] Date of Birth: 01/27/1961  Today's Date: 03/23/2015 Time: SLP Start Time (ACUTE ONLY): 1048 SLP Stop Time (ACUTE ONLY): 1117 SLP Time Calculation (min) (ACUTE ONLY): 29 min  Past Medical History:  Past Medical History  Diagnosis Date  . COPD (chronic obstructive pulmonary disease)   . Anxiety   . Shortness of breath   . Arthritis   . GERD (gastroesophageal reflux disease)   . Full dentures   . Insomnia   . Alcohol abuse    Past Surgical History:  Past Surgical History  Procedure Laterality Date  . Arm surgery    . Hand surgery  1990    both rt/lt carpal tunnels  . Shoulder surgery      right and left-age [age]  . Orif ulnar / radial shaft fracture  1990    right-got infection-had total 10 surgeies 1990  . Orif ulnar fracture Left 05/23/2014    Procedure: OPEN REDUCTION INTERNAL FIXATION (ORIF) LEFT ULNAR FRACTURE;  Surgeon: Harvie JuniorJohn L Graves, MD;  Location:  SURGERY CENTER;  Service: Orthopedics;  Laterality: Left;   HPI:  54 y.o. F admitted 3/28 for AECOPD. On 3/29, had ETOH withdrawal with high CIWA scores despite high doses Ativan.Pt in early DT's per MD. CXR negative.    Assessment / Plan / Recommendation Clinical Impression  Pt with mild dysphagia symptoms likely due to her lethargy from ? DTs.  Pt was also poorly positioned - self positioned after SLP/sitter slid her up.  She is nearly aphonic indicating decreased airway protection.  Cough - strong - noted x1 with repositioning.  Cough x3 noted during intake - may be due to her COPD.  CXR negative.  Recommend to continue regular/thin diet with strict precautions.  Pt is edentulous and SLP did not locate dentures in room.- therefore recommend to order softer foods for her.    Will follow up x1 to assure tolerance of po diet and for pt/family education.  Anticipate swallow function to return to baseline with improved  mental/medical status.  Educated pt to findings and encouraged use of compensation strategies = including sitting fully upright and using caution.      Aspiration Risk  Moderate    Diet Recommendation Regular;Thin liquid   Liquid Administration via: Cup;Straw Medication Administration:  (as tolerated) Supervision: Full supervision/cueing for compensatory strategies;Staff to assist with self feeding Compensations: Slow rate;Small sips/bites Postural Changes and/or Swallow Maneuvers: Seated upright 90 degrees;Upright 30-60 min after meal    Other  Recommendations Oral Care Recommendations: Oral care BID   Follow Up Recommendations   (tbd)    Frequency and Duration min 1 x/week  1 week   Pertinent Vitals/Pain Afebrile, decreased      Swallow Study Prior Functional Status    see hhx    General Date of Onset: 03/23/15 HPI: 54 y.o. F admitted 3/28 for AECOPD. On 3/29, had ETOH withdrawal with high CIWA scores despite high doses Ativan.Pt in early DT's per MD. CXR negative.  Type of Study: Bedside swallow evaluation Diet Prior to this Study: Regular;Thin liquids Temperature Spikes Noted: No Respiratory Status: Room air History of Recent Intubation: No Behavior/Cognition: Impulsive;Distractible;Uncooperative;Decreased sustained attention;Requires cueing (tired but participative) Oral Cavity - Dentition: Edentulous (no dentures present) Self-Feeding Abilities: Able to feed self;Needs set up;Needs assist (able to feed self with slp placing food/cup in hand) Patient Positioning: Upright in bed Baseline Vocal Quality: Aphonic;Low vocal intensity Volitional Cough: Strong Volitional Swallow: Able to  elicit    Oral/Motor/Sensory Function Overall Oral Motor/Sensory Function:  (generalized weakness, no focal cn deficits, pt is dysarthric)   Ice Chips Ice chips: Not tested   Thin Liquid Presentation: Cup;Straw Other Comments: delayed cough x2 during entire "snack",  baseline cough  noted with repositioning    Nectar Thick Nectar Thick Liquid: Not tested   Honey Thick Honey Thick Liquid: Not tested   Puree Puree: Within functional limits Presentation: Self Fed;Spoon   Solid   GO    Solid: Within functional limits Presentation: Spoon;Self Fed Other Comments: cracker softened with applesauce- slow but effective mastication       Donavan Burnet, MS [facility] SLP [phone]

## 2015-03-23 NOTE — Progress Notes (Addendum)
PT Cancellation Note  Patient Details Name: Rhonda Mitchell MRN: [id number] DOB: [birth year]   Cancelled Treatment:    Reason Eval/Treat Not Completed: Fatigue/lethargy limiting ability to participate. Pt unable to be aroused to voice or touch. Per sitter, pt has been asleep for about an hour. Will continue to follow and check back as able to complete PT eval.  Addendum: Attempted PT eval again at 1330 and pt is arousable but refuses to work with therapy. Will attempt again tomorrow.   Conni SlipperKirkman, Mariachristina Holle 03/23/2015, 11:03 AM   Conni SlipperLaura Bryanna Yim, PT, DPT Acute Rehabilitation Services Pager: [phone]

## 2015-03-23 NOTE — Progress Notes (Signed)
[facility] PROGRESS NOTE  Rhonda Mitchell ZOX:[id number] DOB: Apr 11, 1961 DOA: 03/18/2015 PCP: [name], MD  Assessment/Plan: 54 y.o. With PMH of COPC , tobacco use, etoh abuse, apparently c/o dyspnea x2 days, along with cough with yellow sputum. On 3/29, had ETOH withdrawal with high CIWA scores despite high doses Ativan. PCCM called for consideration precedex. She was stabilized and transferred to hospitalist service on 03/22/2015    1. Alcohol intoxication and now withdrawal. Counseled to quit, appears to be an early DTs, continue bedside sitter, initiated CIWA protocol-add scheduled Librium. Feeding assistance and aspiration precautions. We will have speech evaluate as well. -clinically improving, no hallucinations;  2. COPD/smoking. Counseled to quit. no wheezing, supportive care with nebulizers and oxygen as needed. 3. Hypokalemia. Replaced and monitor. 4. Hyponatremia likely due to chronic alcoholism   Code Status: full Family Communication: d/w patient, no family at the bedside  (indicate person spoken with, relationship, and if by phone, the number) Disposition Plan: pend clinical improvement    Consultants:  PCCM  Procedures:    Antibiotics: Anti-infectives    Start   Dose/Rate Route Frequency Ordered Stop   03/21/15 1000  levofloxacin (LEVAQUIN) tablet 500 mg    500 mg Oral Daily 03/21/15 0810 03/23/15 0959   03/18/15 2345  levofloxacin (LEVAQUIN) IVPB 500 mg Status: Discontinued    500 mg 100 mL/hr over 60 Minutes Intravenous Every 24 hours 03/18/15 2342 03/21/15 0810        (indicate start date, and stop date if known)  HPI/Subjective: Alert, denies hallucinations, no SOb  Objective: Filed Vitals:   03/23/15 1100  BP: 130/73  Pulse: 69  Temp: 97.9 F (36.6 C)  Resp: 20    Intake/Output Summary (Last 24 hours) at 03/23/15 1442 Last data filed at 03/23/15 0804  Gross per 24 hour  Intake    117 ml   Output    700 ml  Net   -583 ml   Filed Weights   03/19/15 0126 03/19/15 2314  Weight: 46.584 kg (102 lb 11.2 oz) 48.7 kg (107 lb 5.8 oz)    Exam:   General:  No distress   Cardiovascular: s1,s2 rrr  Respiratory: no wheezing   Abdomen: soft,. Nt, nd   Musculoskeletal: no leg edema   Data Reviewed: Basic Metabolic Panel:  Recent Labs Lab 03/18/15 2109 03/18/15 2202 03/19/15 0505 03/20/15 0221 03/21/15 0450 03/22/15 0530 03/23/15 0447  NA 125* 125* 125*  --  128* 130* 131*  K 3.8 3.8 4.5  --  3.8 3.0* 4.1  CL 87* 87* 89*  --  97 97 100  CO2 24  --  23  --  GLUCOSE 130* 128* 160*  --  104* 119* 111*  BUN <5* 5* 9  --  CREATININE 0.67 0.80 0.75  --  0.52 0.49* 0.51  CALCIUM 8.3*  --  8.5  --  8.0* 7.8* 8.6  MG  --   --   --  2.3 1.8 1.6 2.0  PHOS  --   --   --  4.2 2.4  --   --    Liver Function Tests:  Recent Labs Lab 03/18/15 2109 03/19/15 0505  AST 120* 100*  ALT 49* 46*  ALKPHOS 92 86  BILITOT 1.1 1.2  PROT 7.2 7.5  ALBUMIN 3.9 3.7   No results for input(s): LIPASE, AMYLASE in the last 168 hours. No results for input(s): AMMONIA in the last 168 hours. CBC:  Recent Labs Lab 03/18/15 2109 03/18/15 2202 03/19/15 0505 03/21/15 0450  WBC 9.2  --  4.2 8.3  HGB 12.3 16.7* 11.6* 10.3*  HCT 35.2* 49.0* 33.6* 30.7*  MCV 103.5*  --  106.0* 108.1*  PLT 135*  --  130* 135*   Cardiac Enzymes: No results for input(s): CKTOTAL, CKMB, CKMBINDEX, TROPONINI in the last 168 hours. BNP (last 3 results)  Recent Labs  03/18/15 2109  BNP 234.0*    ProBNP (last 3 results) No results for input(s): PROBNP in the last 8760 hours.  CBG:  Recent Labs Lab 03/19/15 2342  GLUCAP 130*    Recent Results (from the past 240 hour(s))  MRSA PCR Screening     Status: Abnormal   Collection Time: 03/19/15 12:48 PM  Result Value Ref Range Status   MRSA by PCR POSITIVE (A) NEGATIVE Final    Comment:        The GeneXpert MRSA Assay  (FDA approved for NASAL specimens only), is one component of a comprehensive MRSA colonization surveillance program. It is not intended to diagnose MRSA infection nor to guide or monitor treatment for MRSA infections. RESULT CALLED TO, READ BACK BY AND VERIFIED WITH: W. DAVIS RN 15:20 03/19/15 ([email or web address])      Studies: No results found.  Scheduled Meds: . arformoterol  15 mcg Nebulization BID  . budesonide (PULMICORT) nebulizer solution  0.5 mg Nebulization BID  . chlordiazePOXIDE  10 mg Oral TID  . Chlorhexidine Gluconate Cloth  6 each Topical Q0600  . enoxaparin (LOVENOX) injection  40 mg Subcutaneous Q24H  . folic acid  1 mg Oral Daily  . ipratropium-albuterol  3 mL Nebulization BID  . LORazepam  0-4 mg Intravenous Q6H   Followed by  . [START ON 03/24/2015] LORazepam  0-4 mg Intravenous Q12H  . multivitamin with minerals  1 tablet Oral Daily  . mupirocin ointment  1 application Nasal BID  . nicotine  21 mg Transdermal Daily  . thiamine  100 mg Oral Daily   Continuous Infusions:   Active Problems:   Alcohol dependence   COPD exacerbation   Hyponatremia   Hyperglycemia    Time spent: >35 minutes     Esperanza SheetsBURIEV, Rhonda Mitchell  [facility] Pager [phone]. If 7PM-7AM, please contact night-coverage at [email or web address], password [email or web address] 03/23/2015, 2:42 PM  LOS: 5 days

## 2015-03-24 LAB — BASIC METABOLIC PANEL
Anion gap: 8 (ref 5–15)
BUN: 11 mg/dL (ref 6–23)
CO2: 25 mmol/L (ref 19–32)
CREATININE: 0.51 mg/dL (ref 0.50–1.10)
Calcium: 8.5 mg/dL (ref 8.4–10.5)
Chloride: 96 mmol/L (ref 96–112)
GFR calc Af Amer: >90 mL/min (ref >90)
GFR calc non Af Amer: >90 mL/min (ref >90)
GLUCOSE: 117 mg/dL — AB (ref 70–99)
Potassium: 3.8 mmol/L (ref 3.5–5.1)
Sodium: 129 mmol/L — ABNORMAL LOW (ref 135–145)

## 2015-03-24 LAB — CBC
HCT: 30.1 % — ABNORMAL LOW (ref 36.0–46.0)
Hemoglobin: 10.3 g/dL — ABNORMAL LOW (ref 12.0–15.0)
MCH: 36.3 pg — AB (ref 26.0–34.0)
MCHC: 34.2 g/dL (ref 30.0–36.0)
MCV: 106 fL — AB (ref 78.0–100.0)
PLATELETS: 190 10*3/uL (ref 150–400)
RBC: 2.84 MIL/uL — ABNORMAL LOW (ref 3.87–5.11)
RDW: 12.6 % (ref 11.5–15.5)
WBC: 5.7 10*3/uL (ref 4.0–10.5)

## 2015-03-24 LAB — MAGNESIUM: Magnesium: 1.6 mg/dL (ref 1.5–2.5)

## 2015-03-24 MED ORDER — LORAZEPAM 2 MG/ML IJ SOLN
1.0000 mg | Freq: Once | INTRAMUSCULAR | Status: AC
  Administered 2015-03-24: 1 mg via INTRAVENOUS
  Filled 2015-03-24: qty 1

## 2015-03-24 MED ORDER — CHLORDIAZEPOXIDE HCL 5 MG PO CAPS
10.0000 mg | ORAL_CAPSULE | Freq: Three times a day (TID) | ORAL | Status: DC
  Administered 2015-03-24 – 2015-03-25 (×3): 10 mg via ORAL
  Filled 2015-03-24 (×3): qty 2

## 2015-03-24 MED ORDER — LORAZEPAM 2 MG/ML IJ SOLN
0.5000 mg | Freq: Once | INTRAMUSCULAR | Status: AC
  Administered 2015-03-24: 0.5 mg via INTRAVENOUS
  Filled 2015-03-24: qty 1

## 2015-03-24 NOTE — Evaluation (Signed)
Physical Therapy Evaluation Patient Details Name: Rhonda Mitchell MRN: [id number] DOB: 1961-01-07 Today's Date: 03/24/2015   History of Present Illness  Pt admit for COPD exacerbation with respiratory disress.  Alcohol withdrawal as well .   Clinical Impression  Pt admitted with above diagnosis. Pt currently with functional limitations due to the deficits listed below (see PT Problem List). Pt will need SNF with therapy at d/c.  Tearful today during session about weakness.  Husband works and is only home 1 day a week.  Will follow acutely.   Pt will benefit from skilled PT to increase their independence and safety with mobility to allow discharge to the venue listed below.      Follow Up Recommendations SNF;Supervision/Assistance - 24 hour    Equipment Recommendations  Other (comment) (TBA)    Recommendations for Other Services       Precautions / Restrictions Precautions Precautions: Fall Restrictions Weight Bearing Restrictions: No      Mobility  Bed Mobility Overal bed mobility: Needs Assistance;+2 for physical assistance Bed Mobility: Supine to Sit     Supine to sit: Mod assist     General bed mobility comments: Needed assist for elevation of trunk.  Poor postural stability.    Transfers Overall transfer level: Needs assistance Equipment used: 2 person hand held assist Transfers: Sit to/from Stand Sit to Stand: Min assist;Mod assist         General transfer comment: Needed assist for power up and assist to steady pt once pt in standing.    Ambulation/Gait             General Gait Details: pt too unsteady to ambulate  Stairs            Wheelchair Mobility    Modified Rankin (Stroke Patients Only)       Balance Overall balance assessment: Needs assistance;History of Falls Sitting-balance support: No upper extremity supported;Feet supported Sitting balance-Leahy Scale: Fair   Postural control: Posterior lean Standing balance support:  Single extremity supported;During functional activity Standing balance-Leahy Scale: Poor Standing balance comment: Required single UE support in standing.  Needed min steadying assist to mod assist to stand for 1 minute at EOB.  Pt leaning posteriorly.  Shaky on her feet and shaky in general.                               Pertinent Vitals/Pain Pain Assessment: No/denies pain  VSS    Home Living Family/patient expects to be discharged to:: Private residence Living Arrangements: Spouse/significant other Available Help at Discharge: Family;Available PRN/intermittently (husband a truck driver and home 1 day week) Type of Home: House Home Access: Ramped entrance     Home Layout: One level Home Equipment: Walker - 2 wheels;Cane - single point      Prior Function Level of Independence: Independent with assistive device(s)         Comments: sometimes used the RW and sometimes did not     Hand Dominance   Dominant Hand: Right    Extremity/Trunk Assessment   Upper Extremity Assessment: Defer to OT evaluation           Lower Extremity Assessment: Generalized weakness         Communication   Communication: No difficulties  Cognition Arousal/Alertness: Awake/alert Behavior During Therapy: WFL for tasks assessed/performed Overall Cognitive Status: Impaired/Different from baseline Area of Impairment: Safety/judgement;Awareness;Problem solving         Safety/Judgement: Decreased  awareness of safety;Decreased awareness of deficits Awareness: Intellectual Problem Solving: Difficulty sequencing;Requires verbal cues;Requires tactile cues      General Comments      Exercises General Exercises - Lower Extremity Ankle Circles/Pumps: AROM;Both;10 reps;Supine Long Arc Quad: AROM;Both;5 reps;Seated Hip Flexion/Marching: AROM;Both;10 reps;Seated      Assessment/Plan    PT Assessment Patient needs continued PT services  PT Diagnosis Generalized  weakness;Acute pain   PT Problem List Decreased activity tolerance;Decreased balance;Decreased mobility;Decreased knowledge of use of DME;Decreased safety awareness;Decreased knowledge of precautions;Pain;Decreased strength  PT Treatment Interventions DME instruction;Gait training;Functional mobility training;Therapeutic activities;Therapeutic exercise;Balance training;Patient/family education   PT Goals (Current goals can be found in the Care Plan section) Acute Rehab PT Goals Patient Stated Goal: to get better  PT Goal Formulation: With patient Time For Goal Achievement: 04/07/15 Potential to Achieve Goals: Good    Frequency Min 3X/week   Barriers to discharge Decreased caregiver support      Co-evaluation               End of Session Equipment Utilized During Treatment: Gait belt Activity Tolerance: Patient limited by fatigue Patient left: in bed;with call bell/phone within reach;with bed alarm set Nurse Communication: Mobility status;Need for lift equipment         Time: 1100-1123 PT Time Calculation (min) (ACUTE ONLY): 23 min   Charges:   PT Evaluation $Initial PT Evaluation Tier I: 1 Procedure PT Treatments $Therapeutic Activity: 8-22 mins   PT G CodesBerline Mitchell:        Rhonda Mitchell 03/24/2015, 2:32 PM Faviola Klare,PT Acute Rehabilitation [phone] (pager)

## 2015-03-24 NOTE — Progress Notes (Signed)
[facility] PROGRESS NOTE  Rhonda Mitchell WUJ:[id number] DOB: 05-21-1961 DOA: 03/18/2015 PCP: [name], [name], MD  Assessment/Plan: 54 y.o. with PMH of COPD , tobacco use, etoh abuse, apparently c/o dyspnea x2 days, along with cough with yellow sputum. On 3/29, had ETOH withdrawal with high CIWA scores despite high doses Ativan. PCCM called for consideration precedex. She was stabilized and transferred to hospitalist service on 03/22/2015    1. Alcohol intoxication and now withdrawal. Clinically improving, no hallucinations, but very anxious; cont CIWA protocol, cont scheduled Librium. Feeding assistance and aspiration precautions. 2. COPD/smoking. Counseled to quit. no wheezing, supportive care with nebulizers and oxygen as needed. 3. Hypokalemia. Replaced and monitor 4. Hyponatremia likely due to chronic alcoholism 5. Acute encephalopathy on admission likely due to DTs; neuro exam no focal; CT head: chronci cerebral atrophy;  -encephalopathy is improving   D/w patient, called updated  Rhonda Mitchell Significant other [phone]  [phone]    Code Status: full Family Communication: d/w patient, no family at the bedside  (indicate person spoken with, relationship, and if by phone, the number) Disposition Plan: pend clinical improvement, PT   Consultants:  PCCM  Procedures:    Antibiotics: Anti-infectives    Start   Dose/Rate Route Frequency Ordered Stop   03/21/15 1000  levofloxacin (LEVAQUIN) tablet 500 mg    500 mg Oral Daily 03/21/15 0810 03/23/15 0959   03/18/15 2345  levofloxacin (LEVAQUIN) IVPB 500 mg Status: Discontinued    500 mg 100 mL/hr over 60 Minutes Intravenous Every 24 hours 03/18/15 2342 03/21/15 0810        (indicate start date, and stop date if known)  HPI/Subjective: Alert, denies hallucinations, no SOb  Objective: Filed Vitals:   03/24/15 0605  BP: 118/75  Pulse: 76  Temp: 98.3 F (36.8 C)  Resp:  18   No intake or output data in the 24 hours ending 03/24/15 0945 Filed Weights   03/19/15 0126 03/19/15 2314  Weight: 46.584 kg (102 lb 11.2 oz) 48.7 kg (107 lb 5.8 oz)    Exam:   General:  No distress   Cardiovascular: s1,s2 rrr  Respiratory: no wheezing   Abdomen: soft,. Nt, nd   Musculoskeletal: no leg edema   Data Reviewed: Basic Metabolic Panel:  Recent Labs Lab 03/19/15 0505 03/20/15 0221 03/21/15 0450 03/22/15 0530 03/23/15 0447 03/24/15 0543  NA 125*  --  128* 130* 131* 129*  K 4.5  --  3.8 3.0* 4.1 3.8  CL 89*  --  97 97 100 96  CO2 23  --  24 29 28 25   GLUCOSE 160*  --  104* 119* 111* 117*  BUN 9  --  13 9 9 11   CREATININE 0.75  --  0.52 0.49* 0.51 0.51  CALCIUM 8.5  --  8.0* 7.8* 8.6 8.5  MG  --  2.3 1.8 1.6 2.0 1.6  PHOS  --  4.2 2.4  --   --   --    Liver Function Tests:  Recent Labs Lab 03/18/15 2109 03/19/15 0505  AST 120* 100*  ALT 49* 46*  ALKPHOS 92 86  BILITOT 1.1 1.2  PROT 7.2 7.5  ALBUMIN 3.9 3.7   No results for input(s): LIPASE, AMYLASE in the last 168 hours. No results for input(s): AMMONIA in the last 168 hours. CBC:  Recent Labs Lab 03/18/15 2109 03/18/15 2202 03/19/15 0505 03/21/15 0450 03/24/15 0543  WBC 9.2  --  4.2 8.3 5.7  HGB 12.3 16.7* 11.6* 10.3* 10.3*  HCT 35.2*  49.0* 33.6* 30.7* 30.1*  MCV 103.5*  --  106.0* 108.1* 106.0*  PLT 135*  --  130* 135* 190   Cardiac Enzymes: No results for input(s): CKTOTAL, CKMB, CKMBINDEX, TROPONINI in the last 168 hours. BNP (last 3 results)  Recent Labs  03/18/15 2109  BNP 234.0*    ProBNP (last 3 results) No results for input(s): PROBNP in the last 8760 hours.  CBG:  Recent Labs Lab 03/19/15 2342  GLUCAP 130*    Recent Results (from the past 240 hour(s))  MRSA PCR Screening     Status: Abnormal   Collection Time: 03/19/15 12:48 PM  Result Value Ref Range Status   MRSA by PCR POSITIVE (A) NEGATIVE Final    Comment:        The GeneXpert MRSA Assay  (FDA approved for NASAL specimens only), is one component of a comprehensive MRSA colonization surveillance program. It is not intended to diagnose MRSA infection nor to guide or monitor treatment for MRSA infections. RESULT CALLED TO, READ BACK BY AND VERIFIED WITH: W. DAVIS RN 15:20 03/19/15 ([email or web address])      Studies: No results found.  Scheduled Meds: . arformoterol  15 mcg Nebulization BID  . budesonide (PULMICORT) nebulizer solution  0.5 mg Nebulization BID  . chlordiazePOXIDE  5 mg Oral TID  . doxycycline  100 mg Oral Q12H  . enoxaparin (LOVENOX) injection  40 mg Subcutaneous Q24H  . folic acid  1 mg Oral Daily  . ipratropium-albuterol  3 mL Nebulization BID  . LORazepam  0-4 mg Intravenous Q6H   Followed by  . LORazepam  0-4 mg Intravenous Q12H  . multivitamin with minerals  1 tablet Oral Daily  . nicotine  21 mg Transdermal Daily  . thiamine  100 mg Oral Daily   Continuous Infusions:   Active Problems:   Alcohol dependence   COPD exacerbation   Hyponatremia   Hyperglycemia    Time spent: >35 minutes     Esperanza Sheets  [facility] Pager [phone]. If 7PM-7AM, please contact night-coverage at [email or web address], password [facility] 03/24/2015, 9:45 AM  LOS: 6 days

## 2015-03-25 LAB — BASIC METABOLIC PANEL
Anion gap: 6 (ref 5–15)
BUN: 15 mg/dL (ref 6–23)
CO2: 27 mmol/L (ref 19–32)
Calcium: 8.7 mg/dL (ref 8.4–10.5)
Chloride: 98 mmol/L (ref 96–112)
Creatinine, Ser: 0.57 mg/dL (ref 0.50–1.10)
GFR calc Af Amer: >90 mL/min (ref >90)
Glucose, Bld: 103 mg/dL — ABNORMAL HIGH (ref 70–99)
Potassium: 4.1 mmol/L (ref 3.5–5.1)
Sodium: 131 mmol/L — ABNORMAL LOW (ref 135–145)

## 2015-03-25 LAB — MAGNESIUM: Magnesium: 1.6 mg/dL (ref 1.5–2.5)

## 2015-03-25 LAB — HCV RNA QUANT
HCV Quantitative Log: 5.63 {Log} — ABNORMAL HIGH (ref <1.18)
HCV Quantitative: 429263 IU/mL — ABNORMAL HIGH (ref <15)

## 2015-03-25 MED ORDER — IPRATROPIUM-ALBUTEROL 18-103 MCG/ACT IN AERO: 2.0000 | INHALATION_SPRAY | Freq: Four times a day (QID) | RESPIRATORY_TRACT | Status: DC | PRN

## 2015-03-25 MED ORDER — CHLORDIAZEPOXIDE HCL 5 MG PO CAPS: 5.0000 mg | ORAL_CAPSULE | Freq: Two times a day (BID) | ORAL | Status: DC

## 2015-03-25 MED ORDER — BUDESONIDE-FORMOTEROL FUMARATE 80-4.5 MCG/ACT IN AERO: 2.0000 | INHALATION_SPRAY | Freq: Two times a day (BID) | RESPIRATORY_TRACT | Status: DC

## 2015-03-25 MED ORDER — THIAMINE HCL 100 MG PO TABS: 100.0000 mg | ORAL_TABLET | Freq: Every day | ORAL | Status: DC

## 2015-03-25 MED ORDER — LORAZEPAM 2 MG/ML IJ SOLN: 1.0000 mg | Freq: Four times a day (QID) | INTRAMUSCULAR | Status: DC | PRN

## 2015-03-25 MED ORDER — DULOXETINE HCL 60 MG PO CPEP
60.0000 mg | ORAL_CAPSULE | Freq: Every day | ORAL | Status: DC
Start: 2015-03-25 — End: 2015-05-11

## 2015-03-25 MED ORDER — TIOTROPIUM BROMIDE MONOHYDRATE 18 MCG IN CAPS: 18.0000 ug | ORAL_CAPSULE | Freq: Every day | RESPIRATORY_TRACT | Status: DC

## 2015-03-25 MED ORDER — LORAZEPAM 1 MG PO TABS
1.0000 mg | ORAL_TABLET | Freq: Four times a day (QID) | ORAL | Status: DC | PRN
  Administered 2015-03-25 (×2): 1 mg via ORAL
  Filled 2015-03-25: qty 1

## 2015-03-25 MED ORDER — FOLIC ACID 1 MG PO TABS: 1.0000 mg | ORAL_TABLET | Freq: Every day | ORAL | Status: DC

## 2015-03-25 NOTE — Progress Notes (Signed)
Pt. Leaving AMA, Md is aware.

## 2015-03-25 NOTE — Progress Notes (Signed)
Notified by RN that patient insisting on leaving today!. Patient is completely awake and alert, she is oriented X 4. She ambulated across the hall without any major issues or assistance. She is now signing out AMA. She is aware of the risks.

## 2015-03-25 NOTE — Progress Notes (Signed)
Speech Language Pathology Treatment: Dysphagia  Patient Details Name: Rhonda Mitchell MRN: [id number] DOB: [birth year] Today's Date: 03/25/2015 Time: 1030-1314 SLP Time Calculation (min) (ACUTE ONLY): 20 min  Assessment / Plan / Recommendation Clinical Impression  Instructed pt to seek assistance to brush her gums - SLP set pt up to completed.    Pt tolerating po well and reports to be back to baseline regarding swallow ability.  Observed pt consuming breakfast - no s/s of aspiration and clear voice throughout.  Pt admits to occasionally coughing at home but does not associate it with po intake-attributes it to smoking.    Recommend continue regular/thin diet - SLP to sign off as pt returned to baseline and all education completed.     HPI HPI: 54 y.o. F admitted 3/28 for AECOPD. On 3/29, had ETOH withdrawal with high CIWA scores despite high doses Ativan.Pt in early DT's per MD. CXR negative. Followup indicated to assess tolerance of po diet and for education.     Pertinent Vitals Pain Assessment: No/denies pain  SLP Plan  All goals met    Recommendations Diet recommendations: Regular;Thin liquid Liquids provided via: Cup;Straw Medication Administration:  (as tolerated) Supervision: Staff to assist with self feeding Compensations: Slow rate;Small sips/bites Postural Changes and/or Swallow Maneuvers: Seated upright 90 degrees;Upright 30-60 min after meal              Oral Care Recommendations: Oral care BID Plan: All goals met    [place], [place] [facility] SLP [phone]

## 2015-03-25 NOTE — Discharge Summary (Signed)
PATIENT DETAILS Name: Rhonda Mitchell Age: 54 y.o. Sex: female Date of Birth: [birth year] MRN: [id number]. Admitting Physician: Pearson Grippe, MD WUJ:[id number], Alesia Richards, MD  Admit Date: 03/18/2015 Discharge date: 03/25/2015   Please note patient signing out AGAINST MEDICAL ADVICE  Recommendations for Outpatient Follow-up:  1. Please check CBC and chemistries next week 2. Continue counseling regarding avoidance of further alcohol and tobacco use  PRIMARY DISCHARGE DIAGNOSIS:  Active Problems:   Alcohol dependence   COPD exacerbation   Hyponatremia   Hyperglycemia      PAST MEDICAL HISTORY: Past Medical History  Diagnosis Date  . COPD (chronic obstructive pulmonary disease)   . Anxiety   . Shortness of breath   . Arthritis   . GERD (gastroesophageal reflux disease)   . Full dentures   . Insomnia   . Alcohol abuse     DISCHARGE MEDICATIONS: Current Discharge Medication List    START taking these medications   Details  folic acid (FOLVITE) 1 MG tablet Take 1 tablet (1 mg total) by mouth daily. Qty: 30 tablet, Refills: 0    thiamine 100 MG tablet Take 1 tablet (100 mg total) by mouth daily. Qty: 30 tablet, Refills: 0      CONTINUE these medications which have CHANGED   Details  albuterol-ipratropium (COMBIVENT) 18-103 MCG/ACT inhaler Inhale 2 puffs into the lungs every 6 (six) hours as needed for wheezing. Qty: 1 Inhaler, Refills: 0    budesonide-formoterol (SYMBICORT) 80-4.5 MCG/ACT inhaler Inhale 2 puffs into the lungs 2 (two) times daily. Qty: 1 Inhaler, Refills: 0    DULoxetine (CYMBALTA) 60 MG capsule Take 1 capsule (60 mg total) by mouth daily. Qty: 30 capsule, Refills: 0    tiotropium (SPIRIVA) 18 MCG inhalation capsule Place 1 capsule (18 mcg total) into inhaler and inhale daily. Qty: 30 capsule, Refills: 0      CONTINUE these medications which have NOT CHANGED   Details  loratadine (CLARITIN) 10 MG tablet Take 10 mg by mouth daily.      STOP taking  these medications     b complex vitamins tablet      diphenhydramine-acetaminophen (TYLENOL PM) 25-500 MG TABS      ibuprofen (ADVIL,MOTRIN) 200 MG tablet      magnesium citrate SOLN      POTASSIUM PO      amoxicillin (AMOXIL) 500 MG capsule      HYDROcodone-acetaminophen (NORCO) 5-325 MG per tablet      oxyCODONE-acetaminophen (PERCOCET/ROXICET) 5-325 MG per tablet      sulfamethoxazole-trimethoprim (BACTRIM DS,SEPTRA DS) 800-160 MG per tablet         ALLERGIES:  No Known Allergies  BRIEF HPI:  See H&P, Labs, Consult and Test reports for all details in brief, paIs a 54 year old female with history of occult and tobacco use, COPD who presented with cough and shortness of breath. Patient was felt to have COPD exacerbation and admitted for further evaluation and treatment.   CONSULTATIONS:   pulmonary/intensive care  PERTINENT RADIOLOGIC STUDIES: Dg Elbow Complete Right  03/15/2015   CLINICAL DATA:  Fall from standing position tonight, now with right elbow pain. Right arm laceration.  EXAM: RIGHT ELBOW - COMPLETE 3+ VIEW  COMPARISON:  None.  FINDINGS: No fracture or dislocation. The alignment and joint spaces are maintained. Mild degenerative type olecranon spurring. Soft tissue edema noted about the medial elbow with question laceration. No radiopaque foreign body. No elbow joint effusion.  IMPRESSION: Soft tissue injury. No radiopaque foreign body. No acute fracture  or dislocation.   Electronically Signed   By: Rubye Oaks M.D.   On: 03/15/2015 23:49   Ct Head Wo Contrast  03/15/2015   CLINICAL DATA:  Fall right eyebrow laceration  EXAM: CT HEAD WITHOUT CONTRAST  TECHNIQUE: Contiguous axial images were obtained from the base of the skull through the vertex without intravenous contrast.  COMPARISON:  04/23/2014  FINDINGS: No skull fracture is noted. There is moderate cerebral atrophy. Bilateral symmetrical mild prominent of extra-axial space probable due to atrophy. There is  mild scalp swelling in right frontal region see axial image 15. Mild right periorbital soft tissue swelling and subcutaneous stranding. Question laceration/skin irregularity head right lateral periorbital.  No intracranial hemorrhage, mass effect or midline shift. No hydrocephalus. The gray and white-matter differentiation is preserved. No mass lesion is noted on this unenhanced scan.  IMPRESSION: No acute intracranial abnormality. Moderate cerebral atrophy. Mild scalp swelling in right frontal region. There is soft tissue swelling subcutaneous stranding and skin irregularity in right periorbital region. Clinical correlation is necessary.   Electronically Signed   By: Natasha Mead M.D.   On: 03/15/2015 22:41   Ct Cervical Spine Wo Contrast  03/16/2015   CLINICAL DATA:  Initial valuation for acute trauma, fall from standing.  EXAM: CT CERVICAL SPINE WITHOUT CONTRAST  TECHNIQUE: Multidetector CT imaging of the cervical spine was performed without intravenous contrast. Multiplanar CT image reconstructions were also generated.  COMPARISON:  None.  FINDINGS: There is trace anterior listhesis of C3 on C4 and C6 on C7, likely chronic in nature. Otherwise, vertebral bodies are normally aligned with preservation of the normal cervical lordosis. Vertebral body heights maintained. Prevertebral soft tissues normal. Normal C1-2 articulations intact. No acute fracture or listhesis.  Advanced degenerative disc disease is evidenced by intervertebral disc space narrowing, endplate sclerosis, and osteophytosis present at C5-6. Prominent degenerative changes present about the anterior C1-2 articulation. Multilevel facet arthropathy present throughout the cervical spine, greatest on the right.  No soft tissue abnormality within the neck.  No apical pneumothorax.  IMPRESSION: 1. No acute traumatic injury within the cervical spine. 2. Advanced multilevel degenerative disc disease and facet arthropathy, most severe at C5-6.    Electronically Signed   By: Rise Mu M.D.   On: 03/16/2015 00:01   Dg Chest Portable 1 View  03/18/2015   CLINICAL DATA:  Shortness of breath.  EXAM: PORTABLE CHEST - 1 VIEW  COMPARISON:  12/07/2014  FINDINGS: Artifact overlies the chest. Heart size is normal. Mediastinal shadows are normal. The lungs are clear. No effusions. No bony abnormalities.  IMPRESSION: No active disease.   Electronically Signed   By: Paulina Fusi M.D.   On: 03/18/2015 21:04   Dg Hip Unilat With Pelvis 2-3 Views Right  03/21/2015   CLINICAL DATA:  54 year old female who fell in hospital room. Right hip pain. Initial encounter.  EXAM: RIGHT HIP (WITH PELVIS) 2-3 VIEWS  COMPARISON:  CT Abdomen and Pelvis 04/23/2014.  FINDINGS: Bone mineralization is within normal limits. Femoral heads normally located. Pelvis intact. SI joints within normal limits. Proximal left femur grossly intact.  No proximal right femur fracture identified.  IMPRESSION: No acute fracture or dislocation identified about the right hip or pelvis.   Electronically Signed   By: Odessa Fleming M.D.   On: 03/21/2015 13:08   Dg Hip Unilat With Pelvis 2-3 Views Right  03/15/2015   CLINICAL DATA:  Fall from standing position.  Pain.  EXAM: RIGHT HIP (WITH PELVIS) 2-3 VIEWS  COMPARISON:  None.  FINDINGS: No acute bony abnormality. Specifically, no fracture, subluxation, or dislocation. Soft tissues are intact. SI joints and hip joints are symmetric and unremarkable.  IMPRESSION: No acute bony abnormality.   Electronically Signed   By: Charlett NoseKevin  Dover M.D.   On: 03/15/2015 23:49     PERTINENT LAB RESULTS: CBC:  Recent Labs  03/24/15 0543  WBC 5.7  HGB 10.3*  HCT 30.1*  PLT 190   CMET CMP     Component Value Date/Time   NA 131* 03/25/2015 0330   K 4.1 03/25/2015 0330   CL 98 03/25/2015 0330   CO2 27 03/25/2015 0330   GLUCOSE 103* 03/25/2015 0330   BUN 15 03/25/2015 0330   CREATININE 0.57 03/25/2015 0330   CALCIUM 8.7 03/25/2015 0330   PROT 7.5  03/19/2015 0505   ALBUMIN 3.7 03/19/2015 0505   AST 100* 03/19/2015 0505   ALT 46* 03/19/2015 0505   ALKPHOS 86 03/19/2015 0505   BILITOT 1.2 03/19/2015 0505   GFRNONAA >90 03/25/2015 0330   GFRAA >90 03/25/2015 0330    GFR Estimated Creatinine Clearance: 58.4 mL/min (by C-G formula based on Cr of 0.57). No results for input(s): LIPASE, AMYLASE in the last 72 hours. No results for input(s): CKTOTAL, CKMB, CKMBINDEX, TROPONINI in the last 72 hours. Invalid input(s): POCBNP No results for input(s): DDIMER in the last 72 hours. No results for input(s): HGBA1C in the last 72 hours. No results for input(s): CHOL, HDL, LDLCALC, TRIG, CHOLHDL, LDLDIRECT in the last 72 hours. No results for input(s): TSH, T4TOTAL, T3FREE, THYROIDAB in the last 72 hours.  Invalid input(s): FREET3 No results for input(s): VITAMINB12, FOLATE, FERRITIN, TIBC, IRON, RETICCTPCT in the last 72 hours. Coags: No results for input(s): INR in the last 72 hours.  Invalid input(s): PT Microbiology: Recent Results (from the past 240 hour(s))  MRSA PCR Screening     Status: Abnormal   Collection Time: 03/19/15 12:48 PM  Result Value Ref Range Status   MRSA by PCR POSITIVE (A) NEGATIVE Final    Comment:        The GeneXpert MRSA Assay (FDA approved for NASAL specimens only), is one component of a comprehensive MRSA colonization surveillance program. It is not intended to diagnose MRSA infection nor to guide or monitor treatment for MRSA infections. RESULT CALLED TO, READ BACK BY AND VERIFIED WITH: Jenetta DownerW. DAVIS RN 15:20 03/19/15 ([email or web address])      BRIEF HOSPITAL COURSE:  Brief narrative:  54 y.o. F admitted 3/28 for AECOPD. On 3/29, had ETOH withdrawal with high CIWA scores despite high doses Ativan. PCCM called for consideration precedex. She was stabilized and transferred to hospitalist service on 03/22/2015 on day 4 of her hospital stay.  Hospital course by problem list : Alcohol dependence with withdrawal:  Patient was admitted and started on Ativan per CIWA protocol. Unfortunately, patient continued to have withdrawal and progressed into DTs. Patient was then transferred to intensive care unit for precedex infusion. Now much improved, with no signs of withdrawal. Plans were to taper off Librium and Ativan, however on 4/4 patient decided to leave the hospital AGAINST MEDICAL ADVICE. She was completely awake alert and oriented 4. She refused to consider SNF on discharge and wanted to go home. This M.D. asked the patient walk in the hallway and demonstrated that she could walk independently. This M.D. also attempted to call patient's significant other at both numbers listed in the face sheet, however was unable to get in contact  Acute encephalopathy: Secondary to above. Resolved, now awake and alertat time of signing out AMA    Mild hyponatremia:  plans were to continue supportive care and monitor sodium levels   COPD exacerbation: She admitted for shortness of breath, started on Solu-Medrol, Levaquin and scheduled bronchodilators. Currently much improved with clear lungs and no wheezing. Steroids have now been discontinued. she was asked to continue with her inhaler regimen on discharge    Deconditioning: Likely secondary to acute illness.  patient was seen by physical therapy, and recommended SNF. However patient  Patient was  refusing to go to SNF. Since she is signing out AGAINST MEDICAL ADVICE, I have asked RN to get in touch with case management and see if we can arrange her some home health services which has already been ordered. As noted above, this M.D. saw patient walking up and down the hallway independently.Her gait was stable   TODAY-DAY OF DISCHARGE:  Subjective:   [name] today has no headache,no chest abdominal pain,no new weakness tingling or numbness, feels much better wants to go home today.  She is now signing out AGAINST MEDICAL ADVICE   Objective:   Blood pressure  121/89, pulse 73, temperature 98.7 F (37.1 C), temperature source Oral, resp. rate 18, height 5' (1.524 m), weight 48.7 kg (107 lb 5.8 oz), SpO2 100 %.  Intake/Output Summary (Last 24 hours) at 03/25/15 1847 Last data filed at 03/25/15 1500  Gross per 24 hour  Intake    720 ml  Output   1200 ml  Net   -480 ml   Filed Weights   03/19/15 0126 03/19/15 2314  Weight: 46.584 kg (102 lb 11.2 oz) 48.7 kg (107 lb 5.8 oz)    Exam Awake Alert, Oriented *3, No new F.N deficits, Normal affect [place].AT,PERRAL Supple Neck,No JVD, No cervical lymphadenopathy appriciated.  Symmetrical Chest wall movement, Good air movement bilaterally, CTAB RRR,No Gallops,Rubs or new Murmurs, No Parasternal Heave +ve B.Sounds, Abd Soft, Non tender, No organomegaly appriciated, No rebound -guarding or rigidity. No Cyanosis, Clubbing or edema, No new Rash or bruise  DISCHARGE CONDITION: Stable  DISPOSITION:  Home-patient's leaving against medical advise    DISCHARGE INSTRUCTIONS:    Activity:  As tolerated with Full fall precautions use walker/cane & assistance as needed  Diet recommendation: Heart Healthy diet   Discharge Instructions    Call MD for:  extreme fatigue    Complete by:  As directed      Call MD for:  persistant dizziness or light-headedness    Complete by:  As directed      Diet - low sodium heart healthy    Complete by:  As directed      Increase activity slowly    Complete by:  As directed            Follow-up Information    Follow up with NNODI, [name], MD. Schedule an appointment as soon as possible for a visit in 1 week.   Specialty:  Family Medicine   Contact information:   [address] [place] [address] [phone]         Total Time spent on discharge equals 45 minutes.  SignedJeoffrey Massed 03/25/2015 6:47 PM

## 2015-03-25 NOTE — Progress Notes (Signed)
                                                         AMA  Patient at this time expresses desire to leave the Hospital immidiately, patient has been warned that this is not Medically advisable at this time, and can result in Medical complications like Death and Disability, patient understands and accepts the risks involved and assumes full responsibilty of this decision.   Rhonda Mitchell,SHANKER M.D on 03/25/2015 at 6:10 PM  [facility]

## 2015-03-25 NOTE — Progress Notes (Addendum)
PATIENT DETAILS Name: Rhonda Mitchell Age: 54 y.o. Sex: female Date of Birth: [birth year] Admit Date: 03/18/2015 Admitting Physician Pearson GrippeJames Kim, MD RUE:AVWUJPCP:NNODI, Alesia RichardsADAKU, MD  Brief narrative:  54 y.o. F admitted 3/28 for AECOPD. On 3/29, had ETOH withdrawal with high CIWA scores despite high doses Ativan. PCCM called for consideration precedex. She was stabilized and transferred to hospitalist service on 03/22/2015 on day 4 of her hospital stay.  Subjective: Does not want to go to SNF, wants to go home. Patient is awake and alert. Tearful at times.  Assessment/Plan: Active Problems:   Alcohol dependence with withdrawal: Patient was admitted and started on Ativan per CIWA protocol. Unfortunately, patient continued to have withdrawal and progressed into DTs. Patient was then transferred to intensive care unit for precedex infusion. Now much improved, with no signs of withdrawal. We will taper off Librium, and continue Ativan per CIWA protocol.    Acute encephalopathy: Secondary to above. Resolved, now awake and alert    Mild hyponatremia: Continue supportive care.    COPD exacerbation: She admitted for shortness of breath, started on Solu-Medrol, Levaquin and scheduled bronchodilators. Currently much improved with clear lungs and no wheezing. Steroids have now been discontinued. He remains on empiric doxycycline and nebulized bronchodilators     Deconditioning: Likely secondary to acute illness. Continue PT eval. Patient refusing SNF, we will discuss with patient's family. If continues to refuse SNF, then will discharge home with home health services. Please note, patient is completely awake and alert at this time.  Disposition: Remain inpatient-refuses SNF-wants to go home with HHPT  Antibiotics:  See below   Anti-infectives    Start     Dose/Rate Route Frequency Ordered Stop   03/23/15 1800  doxycycline (VIBRA-TABS) tablet 100 mg     100 mg Oral Every 12 hours 03/23/15 1533      03/23/15 1600  doxycycline (VIBRA-TABS) tablet 100 mg  Status:  Discontinued     100 mg Oral Every 12 hours 03/23/15 1532 03/23/15 1533   03/21/15 1000  levofloxacin (LEVAQUIN) tablet 500 mg  Status:  Discontinued     500 mg Oral Daily 03/21/15 0810 03/22/15 1126   03/18/15 2345  levofloxacin (LEVAQUIN) IVPB 500 mg  Status:  Discontinued     500 mg 100 mL/hr over 60 Minutes Intravenous Every 24 hours 03/18/15 2342 03/21/15 0810      DVT Prophylaxis: Prophylactic Lovenox   Code Status: Full code   Family Communication None at bedside  Procedures:  None  CONSULTS:  pulmonary/intensive care  Time spent 40 minutes-which includes 50% of the time with face-to-face with patient/ family and coordinating care related to the above assessment and plan.  MEDICATIONS: Scheduled Meds: . arformoterol  15 mcg Nebulization BID  . budesonide (PULMICORT) nebulizer solution  0.5 mg Nebulization BID  . chlordiazePOXIDE  10 mg Oral TID  . doxycycline  100 mg Oral Q12H  . enoxaparin (LOVENOX) injection  40 mg Subcutaneous Q24H  . folic acid  1 mg Oral Daily  . ipratropium-albuterol  3 mL Nebulization BID  . LORazepam  0-4 mg Intravenous Q12H  . multivitamin with minerals  1 tablet Oral Daily  . nicotine  21 mg Transdermal Daily  . thiamine  100 mg Oral Daily   Continuous Infusions:  PRN Meds:.acetaminophen **OR** [DISCONTINUED] acetaminophen, albuterol, HYDROcodone-homatropine, LORazepam **OR** LORazepam    PHYSICAL EXAM: Vital signs in last 24 hours: Filed Vitals:   03/24/15 2353 03/25/15 0521 03/25/15 1227  03/25/15 1356  BP: 144/89 137/67 112/65 125/77  Pulse: 75 74 73   Temp: 99 F (37.2 C) 98 F (36.7 C)  98.7 F (37.1 C)  TempSrc: Oral Oral    Resp: Height:      Weight:      SpO2: 95% 96%  99%    Weight change:  Filed Weights   03/19/15 0126 03/19/15 2314  Weight: 46.584 kg (102 lb 11.2 oz) 48.7 kg (107 lb 5.8 oz)   Body mass index is 20.97 kg/(m^2).    Gen Exam: Awake and alert with clear speech.   Neck: Supple, No JVD.   Chest: B/L Clear.   CVS: S1 S2 Regular, no murmurs.  Abdomen: soft, BS +, non tender, non distended.  Extremities: no edema, lower extremities warm to touch. Neurologic: Non Focal.   Skin: No Rash.   Wounds: N/A.   Intake/Output from previous day:  Intake/Output Summary (Last 24 hours) at 03/25/15 1617 Last data filed at 03/25/15 1500  Gross per 24 hour  Intake    720 ml  Output   1200 ml  Net   -480 ml     LAB RESULTS: CBC  Recent Labs Lab 03/18/15 2109 03/18/15 2202 03/19/15 0505 03/21/15 0450 03/24/15 0543  WBC 9.2  --  4.2 8.3 5.7  HGB 12.3 16.7* 11.6* 10.3* 10.3*  HCT 35.2* 49.0* 33.6* 30.7* 30.1*  PLT 135*  --  130* 135* 190  MCV 103.5*  --  106.0* 108.1* 106.0*  MCH 36.2*  --  36.6* 36.3* 36.3*  MCHC 34.9  --  34.5 33.6 34.2  RDW 12.9  --  13.1 12.8 12.6    Chemistries   Recent Labs Lab 03/21/15 0450 03/22/15 0530 03/23/15 0447 03/24/15 0543 03/25/15 0330  NA 128* 130* 131* 129* 131*  K 3.8 3.0* 4.1 3.8 4.1  CL 97 97 100 96 98  CO2 GLUCOSE 104* 119* 111* 117* 103*  BUN CREATININE 0.52 0.49* 0.51 0.51 0.57  CALCIUM 8.0* 7.8* 8.6 8.5 8.7  MG 1.8 1.6 2.0 1.6 1.6    CBG:  Recent Labs Lab 03/19/15 2342  GLUCAP 130*    GFR Estimated Creatinine Clearance: 58.4 mL/min (by C-G formula based on Cr of 0.57).  Coagulation profile No results for input(s): INR, PROTIME in the last 168 hours.  Cardiac Enzymes No results for input(s): CKMB, TROPONINI, MYOGLOBIN in the last 168 hours.  Invalid input(s): CK  Invalid input(s): POCBNP No results for input(s): DDIMER in the last 72 hours. No results for input(s): HGBA1C in the last 72 hours. No results for input(s): CHOL, HDL, LDLCALC, TRIG, CHOLHDL, LDLDIRECT in the last 72 hours. No results for input(s): TSH, T4TOTAL, T3FREE, THYROIDAB in the last 72 hours.  Invalid input(s): FREET3 No  results for input(s): VITAMINB12, FOLATE, FERRITIN, TIBC, IRON, RETICCTPCT in the last 72 hours. No results for input(s): LIPASE, AMYLASE in the last 72 hours.  Urine Studies No results for input(s): UHGB, CRYS in the last 72 hours.  Invalid input(s): UACOL, UAPR, USPG, UPH, UTP, UGL, UKET, UBIL, UNIT, UROB, ULEU, UEPI, UWBC, URBC, UBAC, CAST, UCOM, BILUA  MICROBIOLOGY: Recent Results (from the past 240 hour(s))  MRSA PCR Screening     Status: Abnormal   Collection Time: 03/19/15 12:48 PM  Result Value Ref Range Status   MRSA by PCR POSITIVE (A) NEGATIVE Final    Comment:  The GeneXpert MRSA Assay (FDA approved for NASAL specimens only), is one component of a comprehensive MRSA colonization surveillance program. It is not intended to diagnose MRSA infection nor to guide or monitor treatment for MRSA infections. RESULT CALLED TO, READ BACK BY AND VERIFIED WITH: W. DAVIS RN 15:20 03/19/15 ([email or web address])     RADIOLOGY STUDIES/RESULTS: Dg Elbow Complete Right  03/15/2015   CLINICAL DATA:  Fall from standing position tonight, now with right elbow pain. Right arm laceration.  EXAM: RIGHT ELBOW - COMPLETE 3+ VIEW  COMPARISON:  None.  FINDINGS: No fracture or dislocation. The alignment and joint spaces are maintained. Mild degenerative type olecranon spurring. Soft tissue edema noted about the medial elbow with question laceration. No radiopaque foreign body. No elbow joint effusion.  IMPRESSION: Soft tissue injury. No radiopaque foreign body. No acute fracture or dislocation.   Electronically Signed   By: Rubye Oaks M.D.   On: 03/15/2015 23:49   Ct Head Wo Contrast  03/15/2015   CLINICAL DATA:  Fall right eyebrow laceration  EXAM: CT HEAD WITHOUT CONTRAST  TECHNIQUE: Contiguous axial images were obtained from the base of the skull through the vertex without intravenous contrast.  COMPARISON:  04/23/2014  FINDINGS: No skull fracture is noted. There is moderate cerebral atrophy.  Bilateral symmetrical mild prominent of extra-axial space probable due to atrophy. There is mild scalp swelling in right frontal region see axial image 15. Mild right periorbital soft tissue swelling and subcutaneous stranding. Question laceration/skin irregularity head right lateral periorbital.  No intracranial hemorrhage, mass effect or midline shift. No hydrocephalus. The gray and white-matter differentiation is preserved. No mass lesion is noted on this unenhanced scan.  IMPRESSION: No acute intracranial abnormality. Moderate cerebral atrophy. Mild scalp swelling in right frontal region. There is soft tissue swelling subcutaneous stranding and skin irregularity in right periorbital region. Clinical correlation is necessary.   Electronically Signed   By: Natasha Mead M.D.   On: 03/15/2015 22:41   Ct Cervical Spine Wo Contrast  03/16/2015   CLINICAL DATA:  Initial valuation for acute trauma, fall from standing.  EXAM: CT CERVICAL SPINE WITHOUT CONTRAST  TECHNIQUE: Multidetector CT imaging of the cervical spine was performed without intravenous contrast. Multiplanar CT image reconstructions were also generated.  COMPARISON:  None.  FINDINGS: There is trace anterior listhesis of C3 on C4 and C6 on C7, likely chronic in nature. Otherwise, vertebral bodies are normally aligned with preservation of the normal cervical lordosis. Vertebral body heights maintained. Prevertebral soft tissues normal. Normal C1-2 articulations intact. No acute fracture or listhesis.  Advanced degenerative disc disease is evidenced by intervertebral disc space narrowing, endplate sclerosis, and osteophytosis present at C5-6. Prominent degenerative changes present about the anterior C1-2 articulation. Multilevel facet arthropathy present throughout the cervical spine, greatest on the right.  No soft tissue abnormality within the neck.  No apical pneumothorax.  IMPRESSION: 1. No acute traumatic injury within the cervical spine. 2. Advanced  multilevel degenerative disc disease and facet arthropathy, most severe at C5-6.   Electronically Signed   By: Rise Mu M.D.   On: 03/16/2015 00:01   Dg Chest Portable 1 View  03/18/2015   CLINICAL DATA:  Shortness of breath.  EXAM: PORTABLE CHEST - 1 VIEW  COMPARISON:  12/07/2014  FINDINGS: Artifact overlies the chest. Heart size is normal. Mediastinal shadows are normal. The lungs are clear. No effusions. No bony abnormalities.  IMPRESSION: No active disease.   Electronically Signed   By: Scherrie Bateman.D.  On: 03/18/2015 21:04   Dg Hip Unilat With Pelvis 2-3 Views Right  03/21/2015   CLINICAL DATA:  54 year old female who fell in hospital room. Right hip pain. Initial encounter.  EXAM: RIGHT HIP (WITH PELVIS) 2-3 VIEWS  COMPARISON:  CT Abdomen and Pelvis 04/23/2014.  FINDINGS: Bone mineralization is within normal limits. Femoral heads normally located. Pelvis intact. SI joints within normal limits. Proximal left femur grossly intact.  No proximal right femur fracture identified.  IMPRESSION: No acute fracture or dislocation identified about the right hip or pelvis.   Electronically Signed   By: Odessa Fleming M.D.   On: 03/21/2015 13:08   Dg Hip Unilat With Pelvis 2-3 Views Right  03/15/2015   CLINICAL DATA:  Fall from standing position.  Pain.  EXAM: RIGHT HIP (WITH PELVIS) 2-3 VIEWS  COMPARISON:  None.  FINDINGS: No acute bony abnormality. Specifically, no fracture, subluxation, or dislocation. Soft tissues are intact. SI joints and hip joints are symmetric and unremarkable.  IMPRESSION: No acute bony abnormality.   Electronically Signed   By: Charlett Nose M.D.   On: 03/15/2015 23:49    Jeoffrey Massed, MD  [facility] Pager:[phone]  If 7PM-7AM, please contact night-coverage [email or web address] Password [email or web address] 03/25/2015, 4:17 PM   LOS: 7 days

## 2015-03-25 NOTE — Clinical Social Work Note (Signed)
CSW met with patient at bedside. Patient is refusing SNF placement at discharge. Patient would like to be seen by PT again. Per sitter patient has been getting up on her own and has been completing ADLs (washing herself etc.). CSW provided patient with SA resources, but patient appears to be adamant about not seeking treatment at this time. Patient states that she wants to return to her "50 acre farm". CSW signing off at this time, as no further intervention needed.    Liz Beach MSW, [place], [place], [phone]

## 2015-03-25 NOTE — Progress Notes (Signed)
NUTRITION FOLLOW UP  Intervention:   -Continue with current nutrition plan of care  Nutrition Dx:   Inadequate oral intake related to alcohol withdrawal as evidenced by NPO status; resolved  Goal:   Pt to meet >/= 90% of their estimated nutrition needs; met  Monitor:   PO intake, Weight trend, Labs  Assessment:   54 yo female with hx of Copd , tobacco use, etoh abuse, apparently c/o dyspnea x2 days, along with cough with yellow sputum.Pt admitted for Copd exacerbation.  Pt sleeping soundly at time of visit. Pt has been advanced to a regular diet with thin liquids. She is eating well; noted 75-100% meal completion. She continues on MVI, thiamine and folic acid supplements. CSW following. Discharge disposition is SNF vs ETOH rehab.  Labs reviewed. Na: 131, Glucose: 103.   Height: Ht Readings from Last 1 Encounters:  03/21/15 5' (1.524 m)    Weight Status:   Wt Readings from Last 1 Encounters:  03/19/15 107 lb 5.8 oz (48.7 kg)   03/19/15 102 lb 11.2 oz (46.584 kg)       Re-estimated needs:  Kcal: 1400-1600 Protein: 65-75 grams Fluid: 1.4-1.6 L  Skin: ecchymosis, closed medical rt arm incision  Diet Order: Diet regular Room service appropriate?: Yes; Fluid consistency:: Thin   Intake/Output Summary (Last 24 hours) at 03/25/15 1021 Last data filed at 03/25/15 0900  Gross per 24 hour  Intake    480 ml  Output   1000 ml  Net   -520 ml    Last BM: 03/24/15   Labs:   Recent Labs Lab 03/20/15 0221 03/21/15 0450  03/23/15 0447 03/24/15 0543 03/25/15 0330  NA  --  128*  < > 131* 129* 131*  K  --  3.8  < > 4.1 3.8 4.1  CL  --  97  < > 100 96 98  CO2  --  24  < > 28 25 27   BUN  --  13  < > 9 11 15   CREATININE  --  0.52  < > 0.51 0.51 0.57  CALCIUM  --  8.0*  < > 8.6 8.5 8.7  MG 2.3 1.8  < > 2.0 1.6 1.6  PHOS 4.2 2.4  --   --   --   --   GLUCOSE  --  104*  < > 111* 117* 103*  < > = values in this interval not displayed.  CBG (last 3)  No results for  input(s): GLUCAP in the last 72 hours.  Scheduled Meds: . arformoterol  15 mcg Nebulization BID  . budesonide (PULMICORT) nebulizer solution  0.5 mg Nebulization BID  . chlordiazePOXIDE  10 mg Oral TID  . doxycycline  100 mg Oral Q12H  . enoxaparin (LOVENOX) injection  40 mg Subcutaneous Q24H  . folic acid  1 mg Oral Daily  . ipratropium-albuterol  3 mL Nebulization BID  . LORazepam  0-4 mg Intravenous Q12H  . multivitamin with minerals  1 tablet Oral Daily  . nicotine  21 mg Transdermal Daily  . thiamine  100 mg Oral Daily    Continuous Infusions:   Zavion Sleight A. Jimmye Norman, RD, LDN, CDE Pager: [phone] After hours Pager: [phone]

## 2015-03-25 NOTE — Progress Notes (Signed)
ED CM [facility], reviewed record,  [facility] uninsured [facility]. [facility] is leaving [facility]. No further CM needs identified.

## 2015-03-26 NOTE — Progress Notes (Signed)
CARE MANAGEMENT NOTE 03/26/2015  Patient:  Rhonda Mitchell,Rhonda Mitchell   Account Number:  [id number]  Date Initiated:  03/19/2015  Documentation initiated by:  AMERSON,JULIE  Subjective/Objective Assessment:   Pt adm on 03/18/15 with respiratory failure/COPD.  Current ETOH use and withdrawl. PTA, pt resides at home with spouse.     Action/Plan:   Will follow for dc needs as pt progresses.  May transfer to stepdown. Will consult CSW for ETOH counseling.   Anticipated DC Date:  03/22/2015   Anticipated DC Plan:  HOME/SELF CARE  In-house referral  Clinical Social Worker      DC Planning Services  CM consult      Choice offered to / List presented to:             Status of service:  Completed, signed off Medicare Important Message given?   (If response is "NO", the following Medicare IM given date fields will be blank) Date Medicare IM given:   Medicare IM given by:   Date Additional Medicare IM given:   Additional Medicare IM given by:    Discharge Disposition:  AGAINST MEDICAL ADVICE  Per UR Regulation:  Reviewed for med. necessity/level of care/duration of stay  If discussed at Long Length of Stay Meetings, dates discussed:    Comments:  03/26/2015 1500 NCM contacted pt at home. States she does not have PCP at this time. NCM [facility] post dc from hospital. Pt signed out AM. Pt states she has a 53 acre farm but does [facility] feel [facility]. Will follow up at her PCP appt.   Isidoro DonningAlesia Kaelene Elliston RN CCM Case Mgmt phone [phone]

## 2015-03-31 ENCOUNTER — Telehealth: Payer: Self-pay | Admitting: Internal Medicine

## 2015-03-31 NOTE — Telephone Encounter (Signed)
Told patient her hepatitis C virus test was positive and that she needs to get an appointment with the infectious disease clinic to discuss treatment.  Gave her the phone number to call to schedule an appointment.  It does not appear that she has had Hepatitis B s Ab testing.  Hep A IgM was negative.  HIV NR.    Patient voiced her appreciation for the care she received during her recent hospitalization.

## 2015-04-01 NOTE — Telephone Encounter (Signed)
Thanks Thea SilversmithMackenzie, we will get her in once she has seen her PCP.

## 2015-04-01 NOTE — Telephone Encounter (Signed)
Thanks Thea SilversmithMackenzie, I am ccng to Dr Luciana Axeomer who heads up our Hep C treatment program. If patient is actively still abusing alcohol it may be difficult to get her approved for drugs IF she has insurance. If she does not it may be possible but she would need to get her stuff together because it is not trivial doing the leg work to get 86K worth of drugs for her. Is she getting established with PCP as well?

## 2015-05-07 ENCOUNTER — Emergency Department (HOSPITAL_COMMUNITY): Payer: Self-pay | Admitting: Certified Registered Nurse Anesthetist

## 2015-05-07 ENCOUNTER — Encounter (HOSPITAL_COMMUNITY)
Admission: EM | Disposition: A | Discharge status: Home Health | Payer: Self-pay | Source: Home / Self Care | Attending: Orthopedic Surgery

## 2015-05-07 ENCOUNTER — Emergency Department (HOSPITAL_BASED_OUTPATIENT_CLINIC_OR_DEPARTMENT_OTHER): Payer: Self-pay

## 2015-05-07 ENCOUNTER — Encounter (HOSPITAL_BASED_OUTPATIENT_CLINIC_OR_DEPARTMENT_OTHER): Payer: Self-pay | Admitting: *Deleted

## 2015-05-07 ENCOUNTER — Inpatient Hospital Stay (HOSPITAL_BASED_OUTPATIENT_CLINIC_OR_DEPARTMENT_OTHER)
Admission: EM | Admit: 2015-05-07 | Discharge: 2015-05-11 | DRG: 500 | Disposition: A | Discharge status: Home Health | Payer: Self-pay | Attending: Orthopedic Surgery | Admitting: Orthopedic Surgery

## 2015-05-07 DIAGNOSIS — F17210 Nicotine dependence, cigarettes, uncomplicated: Secondary | ICD-10-CM | POA: Diagnosis present

## 2015-05-07 DIAGNOSIS — E871 Hypo-osmolality and hyponatremia: Secondary | ICD-10-CM | POA: Diagnosis present

## 2015-05-07 DIAGNOSIS — K219 Gastro-esophageal reflux disease without esophagitis: Secondary | ICD-10-CM | POA: Diagnosis present

## 2015-05-07 DIAGNOSIS — M1990 Unspecified osteoarthritis, unspecified site: Secondary | ICD-10-CM | POA: Diagnosis present

## 2015-05-07 DIAGNOSIS — E876 Hypokalemia: Secondary | ICD-10-CM

## 2015-05-07 DIAGNOSIS — D539 Nutritional anemia, unspecified: Secondary | ICD-10-CM | POA: Diagnosis present

## 2015-05-07 DIAGNOSIS — M71029 Abscess of bursa, unspecified elbow: Secondary | ICD-10-CM | POA: Diagnosis present

## 2015-05-07 DIAGNOSIS — E43 Unspecified severe protein-calorie malnutrition: Secondary | ICD-10-CM | POA: Diagnosis present

## 2015-05-07 DIAGNOSIS — B9562 Methicillin resistant Staphylococcus aureus infection as the cause of diseases classified elsewhere: Secondary | ICD-10-CM | POA: Diagnosis present

## 2015-05-07 DIAGNOSIS — Z23 Encounter for immunization: Secondary | ICD-10-CM

## 2015-05-07 DIAGNOSIS — D696 Thrombocytopenia, unspecified: Secondary | ICD-10-CM

## 2015-05-07 DIAGNOSIS — F419 Anxiety disorder, unspecified: Secondary | ICD-10-CM | POA: Diagnosis present

## 2015-05-07 DIAGNOSIS — Z8614 Personal history of Methicillin resistant Staphylococcus aureus infection: Secondary | ICD-10-CM

## 2015-05-07 DIAGNOSIS — F10231 Alcohol dependence with withdrawal delirium: Secondary | ICD-10-CM | POA: Diagnosis present

## 2015-05-07 DIAGNOSIS — M71129 Other infective bursitis, unspecified elbow: Secondary | ICD-10-CM

## 2015-05-07 DIAGNOSIS — Z681 Body mass index (BMI) 19 or less, adult: Secondary | ICD-10-CM

## 2015-05-07 DIAGNOSIS — M25421 Effusion, right elbow: Secondary | ICD-10-CM

## 2015-05-07 DIAGNOSIS — G4700 Insomnia, unspecified: Secondary | ICD-10-CM | POA: Diagnosis present

## 2015-05-07 DIAGNOSIS — Z79899 Other long term (current) drug therapy: Secondary | ICD-10-CM

## 2015-05-07 DIAGNOSIS — D6959 Other secondary thrombocytopenia: Secondary | ICD-10-CM | POA: Diagnosis present

## 2015-05-07 DIAGNOSIS — J449 Chronic obstructive pulmonary disease, unspecified: Secondary | ICD-10-CM | POA: Diagnosis present

## 2015-05-07 DIAGNOSIS — B182 Chronic viral hepatitis C: Secondary | ICD-10-CM

## 2015-05-07 DIAGNOSIS — M71121 Other infective bursitis, right elbow: Principal | ICD-10-CM | POA: Diagnosis present

## 2015-05-07 DIAGNOSIS — M71021 Abscess of bursa, right elbow: Secondary | ICD-10-CM | POA: Diagnosis present

## 2015-05-07 DIAGNOSIS — F329 Major depressive disorder, single episode, unspecified: Secondary | ICD-10-CM | POA: Diagnosis present

## 2015-05-07 HISTORY — PX: I & D EXTREMITY: SHX5045

## 2015-05-07 HISTORY — DX: Personal history of Methicillin resistant Staphylococcus aureus infection: Z86.14

## 2015-05-07 HISTORY — DX: Personal history of other diseases of the nervous system and sense organs: Z86.69

## 2015-05-07 HISTORY — DX: Unspecified viral hepatitis C without hepatic coma: B19.20

## 2015-05-07 LAB — CBC WITH DIFFERENTIAL/PLATELET
Basophils Absolute: 0 10*3/uL (ref 0.0–0.1)
Basophils Relative: 0 % (ref 0–1)
EOS PCT: 1 % (ref 0–5)
Eosinophils Absolute: 0.1 10*3/uL (ref 0.0–0.7)
HCT: 35 % — ABNORMAL LOW (ref 36.0–46.0)
HEMOGLOBIN: 11.8 g/dL — AB (ref 12.0–15.0)
LYMPHS ABS: 1 10*3/uL (ref 0.7–4.0)
Lymphocytes Relative: 11 % — ABNORMAL LOW (ref 12–46)
MCH: 34.6 pg — ABNORMAL HIGH (ref 26.0–34.0)
MCHC: 33.7 g/dL (ref 30.0–36.0)
MCV: 102.6 fL — AB (ref 78.0–100.0)
MONO ABS: 1 10*3/uL (ref 0.1–1.0)
Monocytes Relative: 12 % (ref 3–12)
Neutro Abs: 6.9 10*3/uL (ref 1.7–7.7)
Neutrophils Relative %: 76 % (ref 43–77)
Platelets: 156 10*3/uL (ref 150–400)
RBC: 3.41 MIL/uL — ABNORMAL LOW (ref 3.87–5.11)
RDW: 13.6 % (ref 11.5–15.5)
WBC: 9.1 10*3/uL (ref 4.0–10.5)

## 2015-05-07 LAB — BASIC METABOLIC PANEL
Anion gap: 15 (ref 5–15)
BUN: 6 mg/dL (ref 6–20)
CO2: 24 mmol/L (ref 22–32)
CREATININE: 0.51 mg/dL (ref 0.44–1.00)
Calcium: 8.9 mg/dL (ref 8.9–10.3)
Chloride: 93 mmol/L — ABNORMAL LOW (ref 101–111)
GFR calc non Af Amer: >60 mL/min (ref >60)
Glucose, Bld: 98 mg/dL (ref 65–99)
Potassium: 3.2 mmol/L — ABNORMAL LOW (ref 3.5–5.1)
Sodium: 132 mmol/L — ABNORMAL LOW (ref 135–145)

## 2015-05-07 LAB — SEDIMENTATION RATE: Sed Rate: 33 mm/hr — ABNORMAL HIGH (ref 0–22)

## 2015-05-07 LAB — C-REACTIVE PROTEIN: CRP: 3.6 mg/dL — AB (ref <1.0)

## 2015-05-07 LAB — VITAMIN B12: VITAMIN B 12: 350 pg/mL (ref 180–914)

## 2015-05-07 SURGERY — IRRIGATION AND DEBRIDEMENT EXTREMITY
Anesthesia: Regional | Site: Elbow | Laterality: Right

## 2015-05-07 MED ORDER — MORPHINE SULFATE 2 MG/ML IJ SOLN
1.0000 mg | INTRAMUSCULAR | Status: DC | PRN
  Administered 2015-05-08 (×6): 1 mg via INTRAVENOUS
  Filled 2015-05-07 (×6): qty 1

## 2015-05-07 MED ORDER — VANCOMYCIN HCL IN DEXTROSE 1-5 GM/200ML-% IV SOLN
1000.0000 mg | INTRAVENOUS | Status: DC
  Administered 2015-05-08 – 2015-05-10 (×3): 1000 mg via INTRAVENOUS
  Filled 2015-05-07 (×4): qty 200

## 2015-05-07 MED ORDER — LORAZEPAM 2 MG/ML IJ SOLN
1.0000 mg | Freq: Four times a day (QID) | INTRAMUSCULAR | Status: DC | PRN
  Administered 2015-05-08 (×2): 1 mg via INTRAVENOUS
  Filled 2015-05-07 (×2): qty 1

## 2015-05-07 MED ORDER — LACTATED RINGERS IV SOLN
INTRAVENOUS | Status: DC
  Administered 2015-05-07 – 2015-05-09 (×2): via INTRAVENOUS
  Administered 2015-05-10: 20 mL/h via INTRAVENOUS

## 2015-05-07 MED ORDER — PROPOFOL INFUSION 10 MG/ML OPTIME
INTRAVENOUS | Status: DC | PRN
  Administered 2015-05-07: 50 ug/kg/min via INTRAVENOUS

## 2015-05-07 MED ORDER — FENTANYL CITRATE (PF) 100 MCG/2ML IJ SOLN
INTRAMUSCULAR | Status: DC | PRN
  Administered 2015-05-07: 25 ug via INTRAVENOUS
  Administered 2015-05-07: 50 ug via INTRAVENOUS
  Administered 2015-05-07: 25 ug via INTRAVENOUS

## 2015-05-07 MED ORDER — FOLIC ACID 1 MG PO TABS: 1.0000 mg | ORAL_TABLET | Freq: Every day | ORAL | Status: DC

## 2015-05-07 MED ORDER — SODIUM CHLORIDE 0.9 % IV SOLN
Freq: Once | INTRAVENOUS | Status: AC
  Administered 2015-05-07: 22:00:00 via INTRAVENOUS
  Filled 2015-05-07: qty 1000

## 2015-05-07 MED ORDER — HYDROMORPHONE HCL 1 MG/ML IJ SOLN
1.0000 mg | Freq: Once | INTRAMUSCULAR | Status: AC
  Administered 2015-05-07: 1 mg via INTRAMUSCULAR
  Filled 2015-05-07: qty 1

## 2015-05-07 MED ORDER — VITAMIN C 500 MG PO TABS
1000.0000 mg | ORAL_TABLET | Freq: Every day | ORAL | Status: DC
  Administered 2015-05-07 – 2015-05-11 (×5): 1000 mg via ORAL
  Filled 2015-05-07 (×5): qty 2

## 2015-05-07 MED ORDER — OXYCODONE-ACETAMINOPHEN 5-325 MG PO TABS
2.0000 | ORAL_TABLET | Freq: Once | ORAL | Status: AC
  Administered 2015-05-07: 2 via ORAL
  Filled 2015-05-07: qty 2

## 2015-05-07 MED ORDER — MIDAZOLAM HCL 2 MG/2ML IJ SOLN
INTRAMUSCULAR | Status: AC
  Filled 2015-05-07: qty 2

## 2015-05-07 MED ORDER — THIAMINE HCL 100 MG/ML IJ SOLN
100.0000 mg | Freq: Every day | INTRAMUSCULAR | Status: DC
  Administered 2015-05-07: 100 mg via INTRAVENOUS
  Filled 2015-05-07: qty 2

## 2015-05-07 MED ORDER — ONDANSETRON HCL 4 MG/2ML IJ SOLN: 4.0000 mg | Freq: Once | INTRAMUSCULAR | Status: DC | PRN

## 2015-05-07 MED ORDER — LORAZEPAM 1 MG PO TABS
1.0000 mg | ORAL_TABLET | Freq: Four times a day (QID) | ORAL | Status: DC | PRN
  Administered 2015-05-07 – 2015-05-08 (×2): 1 mg via ORAL
  Filled 2015-05-07 (×2): qty 1

## 2015-05-07 MED ORDER — VANCOMYCIN HCL IN DEXTROSE 1-5 GM/200ML-% IV SOLN
1000.0000 mg | INTRAVENOUS | Status: AC
  Administered 2015-05-07: 1000 mg via INTRAVENOUS
  Filled 2015-05-07: qty 200

## 2015-05-07 MED ORDER — VITAMIN B-1 100 MG PO TABS
100.0000 mg | ORAL_TABLET | Freq: Every day | ORAL | Status: DC
  Administered 2015-05-08 – 2015-05-11 (×4): 100 mg via ORAL
  Filled 2015-05-07 (×4): qty 1

## 2015-05-07 MED ORDER — MIDAZOLAM HCL 5 MG/5ML IJ SOLN
INTRAMUSCULAR | Status: DC | PRN
  Administered 2015-05-07: 2 mg via INTRAVENOUS

## 2015-05-07 MED ORDER — DULOXETINE HCL 60 MG PO CPEP
60.0000 mg | ORAL_CAPSULE | Freq: Every day | ORAL | Status: DC
  Administered 2015-05-08 – 2015-05-11 (×4): 60 mg via ORAL
  Filled 2015-05-07 (×4): qty 1

## 2015-05-07 MED ORDER — FENTANYL CITRATE (PF) 250 MCG/5ML IJ SOLN
INTRAMUSCULAR | Status: AC
  Filled 2015-05-07: qty 5

## 2015-05-07 MED ORDER — LIDOCAINE HCL (CARDIAC) 20 MG/ML IV SOLN
INTRAVENOUS | Status: AC
  Filled 2015-05-07: qty 5

## 2015-05-07 MED ORDER — TIOTROPIUM BROMIDE MONOHYDRATE 18 MCG IN CAPS
18.0000 ug | ORAL_CAPSULE | Freq: Every day | RESPIRATORY_TRACT | Status: DC
  Administered 2015-05-09 – 2015-05-11 (×3): 18 ug via RESPIRATORY_TRACT
  Filled 2015-05-07: qty 5

## 2015-05-07 MED ORDER — FOLIC ACID 1 MG PO TABS
1.0000 mg | ORAL_TABLET | Freq: Every day | ORAL | Status: DC
  Administered 2015-05-07 – 2015-05-11 (×5): 1 mg via ORAL
  Filled 2015-05-07 (×5): qty 1

## 2015-05-07 MED ORDER — BUDESONIDE-FORMOTEROL FUMARATE 160-4.5 MCG/ACT IN AERO
2.0000 | INHALATION_SPRAY | Freq: Two times a day (BID) | RESPIRATORY_TRACT | Status: DC
  Administered 2015-05-08 – 2015-05-10 (×5): 2 via RESPIRATORY_TRACT
  Filled 2015-05-07 (×2): qty 6

## 2015-05-07 MED ORDER — ALBUTEROL SULFATE (2.5 MG/3ML) 0.083% IN NEBU: 2.5000 mg | INHALATION_SOLUTION | Freq: Four times a day (QID) | RESPIRATORY_TRACT | Status: DC | PRN

## 2015-05-07 MED ORDER — ONDANSETRON HCL 4 MG/2ML IJ SOLN
4.0000 mg | Freq: Four times a day (QID) | INTRAMUSCULAR | Status: DC | PRN
  Administered 2015-05-09: 4 mg via INTRAVENOUS
  Filled 2015-05-07: qty 2

## 2015-05-07 MED ORDER — ONDANSETRON HCL 4 MG PO TABS: 4.0000 mg | ORAL_TABLET | Freq: Four times a day (QID) | ORAL | Status: DC | PRN

## 2015-05-07 MED ORDER — SODIUM CHLORIDE 0.9 % IR SOLN
Status: DC | PRN
  Administered 2015-05-07: 3000 mL

## 2015-05-07 MED ORDER — BUPIVACAINE HCL (PF) 0.25 % IJ SOLN
30.0000 mL | INTRAMUSCULAR | Status: AC
  Filled 2015-05-07: qty 30

## 2015-05-07 MED ORDER — DOUBLE ANTIBIOTIC 500-10000 UNIT/GM EX OINT
TOPICAL_OINTMENT | CUTANEOUS | Status: AC
  Filled 2015-05-07: qty 1

## 2015-05-07 MED ORDER — POTASSIUM CHLORIDE CRYS ER 20 MEQ PO TBCR
20.0000 meq | EXTENDED_RELEASE_TABLET | Freq: Once | ORAL | Status: AC
  Administered 2015-05-07: 20 meq via ORAL
  Filled 2015-05-07: qty 1

## 2015-05-07 MED ORDER — HYDROMORPHONE HCL 1 MG/ML IJ SOLN: 0.2500 mg | INTRAMUSCULAR | Status: DC | PRN

## 2015-05-07 MED ORDER — ADULT MULTIVITAMIN W/MINERALS CH
1.0000 | ORAL_TABLET | Freq: Every day | ORAL | Status: DC
  Administered 2015-05-07 – 2015-05-11 (×5): 1 via ORAL
  Filled 2015-05-07 (×5): qty 1

## 2015-05-07 MED ORDER — NICOTINE 21 MG/24HR TD PT24
21.0000 mg | MEDICATED_PATCH | Freq: Every day | TRANSDERMAL | Status: DC
  Administered 2015-05-07 – 2015-05-10 (×4): 21 mg via TRANSDERMAL
  Filled 2015-05-07 (×4): qty 1

## 2015-05-07 MED ORDER — BUPIVACAINE-EPINEPHRINE (PF) 0.5% -1:200000 IJ SOLN
INTRAMUSCULAR | Status: DC | PRN
  Administered 2015-05-07: 30 mL via PERINEURAL

## 2015-05-07 MED ORDER — MEPERIDINE HCL 25 MG/ML IJ SOLN: 6.2500 mg | INTRAMUSCULAR | Status: DC | PRN

## 2015-05-07 SURGICAL SUPPLY — 46 items
BANDAGE ELASTIC 3 VELCRO ST LF (GAUZE/BANDAGES/DRESSINGS) ×2 IMPLANT
BANDAGE ELASTIC 4 VELCRO ST LF (GAUZE/BANDAGES/DRESSINGS) ×7 IMPLANT
BNDG CONFORM 2 STRL LF (GAUZE/BANDAGES/DRESSINGS) ×2 IMPLANT
BNDG GAUZE ELAST 4 BULKY (GAUZE/BANDAGES/DRESSINGS) ×7 IMPLANT
CORDS BIPOLAR (ELECTRODE) ×1 IMPLANT
CUFF TOURNIQUET SINGLE 18IN (TOURNIQUET CUFF) ×3 IMPLANT
CUFF TOURNIQUET SINGLE 24IN (TOURNIQUET CUFF) IMPLANT
DRSG ADAPTIC 3X8 NADH LF (GAUZE/BANDAGES/DRESSINGS) ×5 IMPLANT
ELECT REM PT RETURN 9FT ADLT (ELECTROSURGICAL)
ELECTRODE REM PT RTRN 9FT ADLT (ELECTROSURGICAL) IMPLANT
GAUZE SPONGE 4X4 12PLY STRL (GAUZE/BANDAGES/DRESSINGS) ×3 IMPLANT
GAUZE XEROFORM 1X8 LF (GAUZE/BANDAGES/DRESSINGS) ×1 IMPLANT
GAUZE XEROFORM 5X9 LF (GAUZE/BANDAGES/DRESSINGS) ×4 IMPLANT
GLOVE BIOGEL M STRL SZ7.5 (GLOVE) ×3 IMPLANT
GLOVE SS BIOGEL STRL SZ 8 (GLOVE) ×1 IMPLANT
GLOVE SUPERSENSE BIOGEL SZ 8 (GLOVE) ×2
GOWN STRL REUS W/ TWL LRG LVL3 (GOWN DISPOSABLE) ×3 IMPLANT
GOWN STRL REUS W/ TWL XL LVL3 (GOWN DISPOSABLE) ×3 IMPLANT
GOWN STRL REUS W/TWL LRG LVL3 (GOWN DISPOSABLE) ×9
GOWN STRL REUS W/TWL XL LVL3 (GOWN DISPOSABLE) ×9
HANDPIECE INTERPULSE COAX TIP (DISPOSABLE)
KIT BASIN OR (CUSTOM PROCEDURE TRAY) ×3 IMPLANT
KIT ROOM TURNOVER OR (KITS) ×3 IMPLANT
MANIFOLD NEPTUNE II (INSTRUMENTS) ×3 IMPLANT
NDL HYPO 25GX1X1/2 BEV (NEEDLE) IMPLANT
NEEDLE HYPO 25GX1X1/2 BEV (NEEDLE) IMPLANT
NS IRRIG 1000ML POUR BTL (IV SOLUTION) ×3 IMPLANT
PACK ORTHO EXTREMITY (CUSTOM PROCEDURE TRAY) ×3 IMPLANT
PAD ARMBOARD 7.5X6 YLW CONV (MISCELLANEOUS) ×6 IMPLANT
PAD CAST 4YDX4 CTTN HI CHSV (CAST SUPPLIES) ×1 IMPLANT
PADDING CAST COTTON 4X4 STRL (CAST SUPPLIES) ×3
SET HNDPC FAN SPRY TIP SCT (DISPOSABLE) IMPLANT
SPLINT FIBERGLASS 4X30 (CAST SUPPLIES) ×2 IMPLANT
SPONGE GAUZE 4X4 12PLY STER LF (GAUZE/BANDAGES/DRESSINGS) ×2 IMPLANT
SPONGE LAP 18X18 X RAY DECT (DISPOSABLE) ×3 IMPLANT
SPONGE LAP 4X18 X RAY DECT (DISPOSABLE) ×3 IMPLANT
SUT PROLENE 3 0 PS 2 (SUTURE) ×2 IMPLANT
SYR CONTROL 10ML LL (SYRINGE) IMPLANT
TOWEL OR 17X24 6PK STRL BLUE (TOWEL DISPOSABLE) ×3 IMPLANT
TOWEL OR 17X26 10 PK STRL BLUE (TOWEL DISPOSABLE) ×3 IMPLANT
TUBE ANAEROBIC SPECIMEN COL (MISCELLANEOUS) ×2 IMPLANT
TUBE CONNECTING 12'X1/4 (SUCTIONS) ×1
TUBE CONNECTING 12X1/4 (SUCTIONS) ×2 IMPLANT
TUBING CYSTO DISP (UROLOGICAL SUPPLIES) ×2 IMPLANT
WATER STERILE IRR 1000ML POUR (IV SOLUTION) ×1 IMPLANT
YANKAUER SUCT BULB TIP NO VENT (SUCTIONS) ×3 IMPLANT

## 2015-05-07 NOTE — ED Provider Notes (Signed)
CSN: [id number]     Arrival date & time 05/07/15  0830 History   First MD Initiated Contact with Patient 05/07/15 [phone]     Chief Complaint  Patient presents with  . Arm Swelling     (Consider location/radiation/quality/duration/timing/severity/associated sxs/prior Treatment) Patient is a 54 y.o. female presenting with extremity pain.  Extremity Pain This is a new problem. Episode onset: 4 day\ The problem occurs constantly. The problem has been gradually worsening. Pertinent negatives include no chest pain, no abdominal pain and no shortness of breath. Associated symptoms comments: Chills, nausea. Exacerbated by: movement, palpation. Nothing relieves the symptoms. She has tried nothing for the symptoms.    Past Medical History  Diagnosis Date  . COPD (chronic obstructive pulmonary disease)   . Anxiety   . Shortness of breath   . Arthritis   . GERD (gastroesophageal reflux disease)   . Full dentures   . Insomnia   . Alcohol abuse    Past Surgical History  Procedure Laterality Date  . Arm surgery    . Hand surgery  1990    both rt/lt carpal tunnels  . Shoulder surgery      right and left-age [age]  . Orif ulnar / radial shaft fracture  1990    right-got infection-had total 10 surgeies 1990  . Orif ulnar fracture Left 05/23/2014    Procedure: OPEN REDUCTION INTERNAL FIXATION (ORIF) LEFT ULNAR FRACTURE;  Surgeon: Harvie JuniorJohn L Graves, MD;  Location: [facility];  Service: Orthopedics;  Laterality: Left;   Family History  Problem Relation Age of Onset  . Breast cancer Mother    History  Substance Use Topics  . Smoking status: Current Every Day Smoker -- 1.00 packs/day for 20 years    Types: Cigarettes  . Smokeless tobacco: Never Used  . Alcohol Use: 1.8 oz/week    3 Glasses of wine per week     Comment: 6-pack a week   OB History    No data available     Review of Systems  Respiratory: Negative for shortness of breath.   Cardiovascular: Negative for chest  pain.  Gastrointestinal: Negative for abdominal pain.  All other systems reviewed and are negative.     Allergies  Review of patient's allergies indicates no known allergies.  Home Medications   Prior to Admission medications   Medication Sig Start Date End Date Taking? Authorizing Provider  albuterol-ipratropium (COMBIVENT) 18-103 MCG/ACT inhaler Inhale 2 puffs into the lungs every 6 (six) hours as needed for wheezing. 03/25/15   Shanker Levora DredgeM Ghimire, MD  budesonide-formoterol (SYMBICORT) 80-4.5 MCG/ACT inhaler Inhale 2 puffs into the lungs 2 (two) times daily. 03/25/15   Shanker Levora DredgeM Ghimire, MD  DULoxetine (CYMBALTA) 60 MG capsule Take 1 capsule (60 mg total) by mouth daily. 03/25/15   Shanker Levora DredgeM Ghimire, MD  folic acid (FOLVITE) 1 MG tablet Take 1 tablet (1 mg total) by mouth daily. 03/25/15   Shanker Levora DredgeM Ghimire, MD  loratadine (CLARITIN) 10 MG tablet Take 10 mg by mouth daily.    Historical Provider, MD  thiamine 100 MG tablet Take 1 tablet (100 mg total) by mouth daily. 03/25/15   Shanker Levora DredgeM Ghimire, MD  tiotropium (SPIRIVA) 18 MCG inhalation capsule Place 1 capsule (18 mcg total) into inhaler and inhale daily. 03/25/15   Shanker Levora DredgeM Ghimire, MD   BP 146/99 mmHg  Pulse 80  Temp(Src) 97.9 F (36.6 C) (Oral)  Resp 18  Ht 5' (1.524 m)  Wt 100 lb (45.36 kg)  BMI 19.53 kg/m2  SpO2 100% Physical Exam  Constitutional: She is oriented to person, place, and time. She appears well-developed and well-nourished. No distress.  HENT:  Head: Normocephalic and atraumatic.  Eyes: Conjunctivae are normal. No scleral icterus.  Neck: Neck supple.  Cardiovascular: Normal rate and intact distal pulses.   Pulmonary/Chest: Effort normal. No stridor. No respiratory distress.  Abdominal: Soft. Normal appearance. She exhibits no distension. There is no tenderness. There is no rebound and no guarding.  Musculoskeletal:       Right elbow: She exhibits decreased range of motion (pain with ROm) and swelling (over olecranon  process with associated erythema and edema to mid forearm). She exhibits no effusion, no deformity and no laceration. Tenderness found.  Neurological: She is alert and oriented to person, place, and time.  Skin: Skin is warm and dry. No rash noted.  Psychiatric: She has a normal mood and affect. Her behavior is normal.  Nursing note and vitals reviewed.   ED Course  Procedures (including critical care time) Labs Review Labs Reviewed  CBC WITH DIFFERENTIAL/PLATELET - Abnormal; Notable for the following:    RBC 3.41 (*)    Hemoglobin 11.8 (*)    HCT 35.0 (*)    MCV 102.6 (*)    MCH 34.6 (*)    Lymphocytes Relative 11 (*)    All other components within normal limits  BASIC METABOLIC PANEL - Abnormal; Notable for the following:    Sodium 132 (*)    Potassium 3.2 (*)    Chloride 93 (*)    All other components within normal limits  SEDIMENTATION RATE - Abnormal; Notable for the following:    Sed Rate 33 (*)    All other components within normal limits  C-REACTIVE PROTEIN    Imaging Review Dg Elbow Complete Right  05/07/2015   CLINICAL DATA:  Larey SeatFell with swelling and oozing from the elbow.  EXAM: RIGHT ELBOW - COMPLETE 3+ VIEW  COMPARISON:  03/15/2015  FINDINGS: Negative for a fracture or dislocation. There is soft tissue swelling adjacent to the olecranon. No evidence for an elbow joint effusion.  IMPRESSION: Soft tissue swelling without acute bone abnormality.   Electronically Signed   By: Richarda OverlieAdam  Henn M.D.   On: 05/07/2015 09:26  All radiology studies independently viewed by me.      EKG Interpretation None      MDM   Final diagnoses:  Elbow swelling, right    Pt has pain and swelling to right elbow over olecranon.  I suspect septic bursitis.  Less likely septic arthritis.  Discussed with Dr. Amanda PeaGramig, who [facility], possibly take to OR.  Pt NPO, pain controlled.  No abx per Dr. Amanda PeaGramig.  Tetanus UTD. [facility].      Blake DivineJohn Vermon Grays, MD 05/08/15 [phone]

## 2015-05-07 NOTE — H&P (Signed)
Rhonda Mitchell is an 54 y.o. female.   Chief Complaint: Infected right elbow HPI: Patient presents for [facility] with an infection in her right elbow. We have counseled her in regards to risk and benefits of surgery and we'll proceed to the operative theater for drainage  I discussed all issues with the patient and her family.  Patient understands the risk and benefits and desires to proceed  Past Medical History  Diagnosis Date  . COPD (chronic obstructive pulmonary disease)   . Anxiety   . Shortness of breath   . Arthritis   . GERD (gastroesophageal reflux disease)   . Full dentures   . Insomnia   . Alcohol abuse   . Hepatitis C   . Alcohol abuse   . History of MRSA infection   . History of encephalopathy     Past Surgical History  Procedure Laterality Date  . Arm surgery    . Hand surgery  1990    both rt/lt carpal tunnels  . Shoulder surgery      right and left-age [age]  . Orif ulnar / radial shaft fracture  1990    right-got infection-had total 10 surgeies 1990  . Orif ulnar fracture Left 05/23/2014    Procedure: OPEN REDUCTION INTERNAL FIXATION (ORIF) LEFT ULNAR FRACTURE;  Surgeon: Alta Corning, MD;  Location: [facility];  Service: Orthopedics;  Laterality: Left;    Family History  Problem Relation Age of Onset  . Breast cancer Mother    Social History:  reports that she has been smoking Cigarettes.  She has a 20 pack-year smoking history. She has never used smokeless tobacco. She reports that she drinks about 1.8 oz of alcohol per week. She reports that she does not use illicit drugs.  Allergies: No Known Allergies  Medications Prior to Admission  Medication Sig Dispense Refill  . albuterol-ipratropium (COMBIVENT) 18-103 MCG/ACT inhaler Inhale 2 puffs into the lungs every 6 (six) hours as needed for wheezing. 1 Inhaler 0  . budesonide-formoterol (SYMBICORT) 80-4.5 MCG/ACT inhaler Inhale 2 puffs into the lungs 2 (two) times daily. 1  Inhaler 0  . DULoxetine (CYMBALTA) 60 MG capsule Take 1 capsule (60 mg total) by mouth daily. 30 capsule 0  . folic acid (FOLVITE) 1 MG tablet Take 1 tablet (1 mg total) by mouth daily. 30 tablet 0  . loratadine (CLARITIN) 10 MG tablet Take 10 mg by mouth daily.    Marland Kitchen thiamine 100 MG tablet Take 1 tablet (100 mg total) by mouth daily. 30 tablet 0  . tiotropium (SPIRIVA) 18 MCG inhalation capsule Place 1 capsule (18 mcg total) into inhaler and inhale daily. 30 capsule 0    Results for orders placed or performed during the hospital encounter of 05/07/15 (from the past 48 hour(s))  CBC with Differential/Platelet     Status: Abnormal   Collection Time: 05/07/15  8:56 AM  Result Value Ref Range   WBC 9.1 4.0 - 10.5 K/uL   RBC 3.41 (L) 3.87 - 5.11 MIL/uL   Hemoglobin 11.8 (L) 12.0 - 15.0 g/dL   HCT 35.0 (L) 36.0 - 46.0 %   MCV 102.6 (H) 78.0 - 100.0 fL   MCH 34.6 (H) 26.0 - 34.0 pg   MCHC 33.7 30.0 - 36.0 g/dL   RDW 13.6 11.5 - 15.5 %   Platelets 156 150 - 400 K/uL   Neutrophils Relative % 76 43 - 77 %   Neutro Abs 6.9 1.7 - 7.7 K/uL  Lymphocytes Relative 11 (L) 12 - 46 %   Lymphs Abs 1.0 0.7 - 4.0 K/uL   Monocytes Relative 12 3 - 12 %   Monocytes Absolute 1.0 0.1 - 1.0 K/uL   Eosinophils Relative 1 0 - 5 %   Eosinophils Absolute 0.1 0.0 - 0.7 K/uL   Basophils Relative 0 0 - 1 %   Basophils Absolute 0.0 0.0 - 0.1 K/uL  Basic metabolic panel     Status: Abnormal   Collection Time: 05/07/15  8:56 AM  Result Value Ref Range   Sodium 132 (L) 135 - 145 mmol/L   Potassium 3.2 (L) 3.5 - 5.1 mmol/L   Chloride 93 (L) 101 - 111 mmol/L   CO2 24 22 - 32 mmol/L   Glucose, Bld 98 65 - 99 mg/dL   BUN 6 6 - 20 mg/dL   Creatinine, Ser 0.51 0.44 - 1.00 mg/dL   Calcium 8.9 8.9 - 10.3 mg/dL   GFR calc non Af Amer >60 >60 mL/min   GFR calc Af Amer >60 >60 mL/min    Comment: (NOTE) The eGFR has been calculated using the CKD EPI equation. This calculation has not been validated in all clinical  situations. eGFR's persistently <60 mL/min signify possible Chronic Kidney Disease.    Anion gap 15 5 - 15  Sedimentation rate     Status: Abnormal   Collection Time: 05/07/15  8:56 AM  Result Value Ref Range   Sed Rate 33 (H) 0 - 22 mm/hr  C-reactive protein     Status: Abnormal   Collection Time: 05/07/15 11:20 AM  Result Value Ref Range   CRP 3.6 (H) <1.0 mg/dL    Comment: Performed at [facility]   Dg Elbow Complete Right  05/07/2015   CLINICAL DATA:  [place] with swelling and oozing from the elbow.  EXAM: RIGHT ELBOW - COMPLETE 3+ VIEW  COMPARISON:  03/15/2015  FINDINGS: Negative for a fracture or dislocation. There is soft tissue swelling adjacent to the olecranon. No evidence for an elbow joint effusion.  IMPRESSION: Soft tissue swelling without acute bone abnormality.   Electronically Signed   By: Markus Daft M.D.   On: 05/07/2015 09:26    Review of Systems  Respiratory: Positive for shortness of breath.        History of COPD  Gastrointestinal: Negative.   Genitourinary: Negative.   Musculoskeletal:       Infected olecranon bursa right elbow  Neurological: Negative.     Blood pressure 147/91, pulse 74, temperature 98.6 F (37 C), temperature source Oral, resp. rate 18, height 5' (1.524 m), weight 45.36 kg (100 lb), SpO2 94 %. Physical Exam female who appears older than her stated age.  She does have intact chest expansion and no obvious wheeze at present time.  Abdomen is nontender  Her right upper extremity has notable septic features about her olecranon bursa with a small draining sinus. She has no signs of instability about the elbow. Her x-rays are negative for fracture dislocation or space at time lesion. Radial median and ulnar nerve function is intact without abnormality.  Lower extremity examination shows a thin female with intact motor function. Pelvis is stable. She is neurovascularly intact distally  Left upper extremity is intact without obvious  abnormality  Assessment/Plan Infected right elbow olecranon bursa  We will plan for irrigation debridement. She'll be made for IV antibiotic general postop observation.  I've asked the hospitalist to help [place] with her care of course and  appreciate there medical expertise.  Patient understands risk and benefits these and does and desires to proceed with the above-mentioned surgical algorithm of irrigation and debridement to try and rest her septic arm predicament  Esdras Delair III,Layni Kreamer M 05/07/2015, 5:48 PM

## 2015-05-07 NOTE — Progress Notes (Addendum)
ANTIBIOTIC CONSULT NOTE - INITIAL  Pharmacy Consult for vancomycin Indication: septic bursitis  No Known Allergies  Patient Measurements: Height: 5' (152.4 cm) Weight: 100 lb (45.36 kg) IBW/kg (Calculated) : 45.5 Adjusted Body Weight: n/a  Vital Signs: Temp: 98.6 F (37 C) (05/17 1723) Temp Source: Oral (05/17 1339) BP: 147/91 mmHg (05/17 1723) Pulse Rate: 74 (05/17 1723) Intake/Output from previous day:   Intake/Output from this shift:    Labs:  Recent Labs  05/07/15 0856  WBC 9.1  HGB 11.8*  PLT 156  CREATININE 0.51   Estimated Creatinine Clearance: 57.6 mL/min (by C-G formula based on Cr of 0.51). No results for input(s): VANCOTROUGH, VANCOPEAK, VANCORANDOM, GENTTROUGH, GENTPEAK, GENTRANDOM, TOBRATROUGH, TOBRAPEAK, TOBRARND, AMIKACINPEAK, AMIKACINTROU, AMIKACIN in the last 72 hours.   Microbiology: No results found for this or any previous visit (from the past 720 hour(s)).  Medical History: Past Medical History  Diagnosis Date  . COPD (chronic obstructive pulmonary disease)   . Anxiety   . Shortness of breath   . Arthritis   . GERD (gastroesophageal reflux disease)   . Full dentures   . Insomnia   . Alcohol abuse   . Hepatitis C   . Alcohol abuse   . History of MRSA infection   . History of encephalopathy     Assessment: 54 yo female admitted for septic bursitis, currently having I&D in OR.  Pharmacy asked to begin empiric vancomycin.  Scr 0.51, CrCl ~ 57 ml/min.  Goal of Therapy:  Vancomycin trough level 15-20 mcg/ml  Plan:  1. Vancomycin 1g IV q 24 hrs (start after I&D in OR) 2. Check vancomycin trough at steady-state as indicated. 3. F/u culture results.  Tad MooreJessica Carney, Pharm D, BCPS  Clinical Pharmacist Pager [phone]  05/07/2015 6:37 PM   Addum:  Add zosyn 3.375 gm IV q8 hours  Talbert CageLora Tarin Navarez, PharmD

## 2015-05-07 NOTE — ED Notes (Signed)
Supplies gathered and placed in room for edp.

## 2015-05-07 NOTE — Consult Note (Signed)
Medical Consultation  Rhonda Mitchell JXB:[id number] DOB: 08-17-1961 DOA: 05/07/2015 PCP: Rhonda Mitchell, [name], MD   Requesting physician: Dr. Amanda Mitchell Date of consultation: 05/07/2015 Reason for consultation: Medical management  Impression/Recommendations Bursitis right elbow -Concern about septic bursitis -After irrigation debridement, start the patient on vancomycin pending culture data -Dr. Amanda Mitchell taking pt to OR 05/07/15 -CRP 3.6 -Please send surgical samples for Gram stain and culture -blood cultures x 2 sets Alcohol dependence/abuse -Start the patient on alcohol withdrawal protocol -Daily vitamin -Patient was discharged from the hospital on 03/25/2015 after an episode of alcohol withdrawal and DTs requiring Precedex  chronic hepatitis C -Appears clinically compensated at present -Unfortunate, the patient continues to drink 424 ounces of beer daily -She will need outpatient GI follow-up Thrombocytopenia -The patient has chronic, cytopenia secondary to her hepatic disease -Mild worsening likely due to infectious process -Check serum B12 -Monitor for signs of bleeding -am cbc Hyponatremia -likely a degree of volume depletion in setting of chronic liver disease -will give one liter fluid Hypokalemia -replete -check mag COPD -Clinically compensated without any wheezing -Clinically stable on room air -Assurance saturation 99% on room air without wheezing -Continue Symbicort and Spiriva -Albuterol when necessary shortness of breath and wheeze Tobacco abuse -Tobacco cessation discussed -NicoDerm patch Depression -Continue Cymbalta I will followup again tomorrow. Please contact me if I can be of assistance in the meanwhile. Thank you for this consultation.  Chief Complaint: L-elbow pain  HPI:  54 year old female with a history of alcohol abuse, COPD, depression, chronic hepatitis C  presents with 5 day history of right elbow pain. The patient states that she lost her  balance and fell onto her right elbow 5 days prior to this admission. On the following day, she noticed increasing swelling and erythema about the right elbow which has continually worsened until the day of admission. The patient has been taking 6 ibuprofen daily for the past 4-5 days. She had subjective fevers and chills at home. She denied any nausea, vomiting, diarrhea, chest pain, shortness breath, dysuria, hematuria, hematochezia, melena. Unfortunately, the patient continues to drink alcohol. The patient has been checking 424 ounces of beer daily since her discharge from the hospital in April 2016. The patient previously drank liquor, and was discharged from the hospital on 03/25/2015 after being treated for delirium tremens. During the hospitalization, the patient required Precedex.  Dr. Amanda Mitchell was consulted by the emergency department and is taking the patient to surgery today for her right elbow bursitis. Internal medicine has been asked for medical management. The patient continues to smoke 1-1-1/2 packs per day. She denies any coughing, hemoptysis, worsening shortness of breath.  Review of Systems:  Constitutional:  No weight loss, night sweats, Head&Eyes: No headache.  No vision loss.  No eye pain or scotoma ENT:  No Difficulty swallowing,Tooth/dental problems,Sore throat,  No ear ache, post nasal drip,  Cardio-vascular:  No chest pain, Orthopnea, PND, swelling in lower extremities,  dizziness, palpitations  GI:  No heartburn, indigestion, abdominal pain, nausea, vomiting, diarrhea, loss of appetite, hematochezia, melena Resp:  No shortness of breath with exertion or at rest. No excess mucus, no productive cough, No non-productive cough, No coughing up of blood.No change in color of mucus.No wheezing.No chest wall deformity  Skin:  no rash or lesions.  GU:  no dysuria, change in color of urine, no urgency or frequency. No flank pain.  Musculoskeletal:  Right elbow pain and swelling..  No decreased range of motion. No back pain.  Psych:  No  change in mood or affect. No depression or anxiety. Neurologic: No headache, no dysesthesia, no focal weakness, no vision loss. No syncope   Past Medical History  Diagnosis Date  . COPD (chronic obstructive pulmonary disease)   . Anxiety   . Shortness of breath   . Arthritis   . GERD (gastroesophageal reflux disease)   . Full dentures   . Insomnia   . Alcohol abuse   . Hepatitis C   . Alcohol abuse   . History of MRSA infection   . History of encephalopathy    Past Surgical History  Procedure Laterality Date  . Arm surgery    . Hand surgery  1990    both rt/lt carpal tunnels  . Shoulder surgery      right and left-age [age]  . Orif ulnar / radial shaft fracture  1990    right-got infection-had total 10 surgeies 1990  . Orif ulnar fracture Left 05/23/2014    Procedure: OPEN REDUCTION INTERNAL FIXATION (ORIF) LEFT ULNAR FRACTURE;  Surgeon: Harvie Junior, MD;  Location: [facility];  Service: Orthopedics;  Laterality: Left;   Social History:  reports that she has been smoking Cigarettes.  She has a 20 pack-year smoking history. She has never used smokeless tobacco. She reports that she drinks about 1.8 oz of alcohol per week. She reports that she does not use illicit drugs.  Family History  Problem Relation Age of Onset  . Breast cancer Mother     No Known Allergies   Prior to Admission medications   Medication Sig Start Date End Date Taking? Authorizing Provider  albuterol-ipratropium (COMBIVENT) 18-103 MCG/ACT inhaler Inhale 2 puffs into the lungs every 6 (six) hours as needed for wheezing. 03/25/15   Shanker Levora Dredge, MD  budesonide-formoterol (SYMBICORT) 80-4.5 MCG/ACT inhaler Inhale 2 puffs into the lungs 2 (two) times daily. 03/25/15   Shanker Levora Dredge, MD  DULoxetine (CYMBALTA) 60 MG capsule Take 1 capsule (60 mg total) by mouth daily. 03/25/15   Shanker Levora Dredge, MD  folic acid (FOLVITE) 1 MG tablet  Take 1 tablet (1 mg total) by mouth daily. 03/25/15   Shanker Levora Dredge, MD  loratadine (CLARITIN) 10 MG tablet Take 10 mg by mouth daily.    Historical Provider, MD  thiamine 100 MG tablet Take 1 tablet (100 mg total) by mouth daily. 03/25/15   Shanker Levora Dredge, MD  tiotropium (SPIRIVA) 18 MCG inhalation capsule Place 1 capsule (18 mcg total) into inhaler and inhale daily. 03/25/15   Shanker Levora Dredge, MD    Physical Exam: Filed Vitals:   05/07/15 1339 05/07/15 1400 05/07/15 1645 05/07/15 1723  BP: 151/90 129/76  147/91  Pulse: 79 77 79 74  Temp: 98.3 F (36.8 C)   98.6 F (37 C)  TempSrc: Oral     Resp: 18   18  Height:      Weight:      SpO2: 99% 96% 98% 94%   General:  A&O x 3, NAD, nontoxic, pleasant/cooperative Head/Eye: No conjunctival hemorrhage, no icterus, [place]/AT, No nystagmus ENT:  No icterus,  No thrush, edentulous  no pharyngeal exudate Neck:  No masses, no lymphadenpathy, no bruits CV:  RRR, no rub, no gallop, no S3 Lung:  Diminished breath sounds at the bases but clear to auscultation. No wheezes.  Abdomen: soft/NT, +BS, nondistended, no peritoneal signs Ext: No cyanosis, No rashes, No petechiae, No lymphangitis, right elbow with synovitis, and erythema extending to the mid humerus proximally.  There is no crepitance. No necrosis. No draining wound. Right upper extremity is neurovascularly intact.  Neuro: CNII-XII intact, strength 4/5 in bilateral upper and lower extremities, no dysmetria  Labs on Admission:  Basic Metabolic Panel:  Recent Labs Lab 05/07/15 0856  NA 132*  K 3.2*  CL 93*  CO2 24  GLUCOSE 98  BUN 6  CREATININE 0.51  CALCIUM 8.9   Liver Function Tests: No results for input(s): AST, ALT, ALKPHOS, BILITOT, PROT, ALBUMIN in the last 168 hours. No results for input(s): LIPASE, AMYLASE in the last 168 hours. No results for input(s): AMMONIA in the last 168 hours. CBC:  Recent Labs Lab 05/07/15 0856  WBC 9.1  NEUTROABS 6.9  HGB 11.8*  HCT 35.0*   MCV 102.6*  PLT 156   Cardiac Enzymes: No results for input(s): CKTOTAL, CKMB, CKMBINDEX, TROPONINI in the last 168 hours. BNP: Invalid input(s): POCBNP CBG: No results for input(s): GLUCAP in the last 168 hours.  Radiological Exams on Admission: Dg Elbow Complete Right  05/07/2015   CLINICAL DATA:  Larey SeatFell with swelling and oozing from the elbow.  EXAM: RIGHT ELBOW - COMPLETE 3+ VIEW  COMPARISON:  03/15/2015  FINDINGS: Negative for a fracture or dislocation. There is soft tissue swelling adjacent to the olecranon. No evidence for an elbow joint effusion.  IMPRESSION: Soft tissue swelling without acute bone abnormality.   Electronically Signed   By: Richarda OverlieAdam  Henn M.D.   On: 05/07/2015 09:26      Time spent: 60 min  Glory Graefe [facility] Pager [phone]  If 7PM-7AM, please contact night-coverage [email or web address] Password [email or web address] 05/07/2015, 5:47 PM

## 2015-05-07 NOTE — ED Notes (Addendum)
Pt insists on amb outside to smoke despite encouragement to remain in room. amb with sig other, gait steady and strong, denies any c/o at this time.

## 2015-05-07 NOTE — ED Notes (Signed)
OR called and advised ready for pt. Requested RN to call report to [phone].

## 2015-05-07 NOTE — ED Notes (Signed)
Misty StanleyLisa, IV Team, aware of need for IV start.

## 2015-05-07 NOTE — ED Notes (Signed)
Pt amb to room 11 with quick steady gait in nad. Pt reports swelling and pain to her right elbow since Saturday. Pt states "on Sunday, pus came shooting out of it across the room and running down my arm." elbow is swollen, tender and red, warm to touch.

## 2015-05-07 NOTE — ED Notes (Signed)
PT REMOVED RING X 1 AND BRACELETS X 2 FROM RIGHT HAND/WRIST AS REQUESTED - PT GAVE TO SPOUSE.

## 2015-05-07 NOTE — Anesthesia Preprocedure Evaluation (Addendum)
Anesthesia Evaluation  Patient identified by MRN, date of birth, ID band Patient awake    Reviewed: Allergy & Precautions, NPO status , Patient's Chart, lab work & pertinent test results  Airway Mallampati: II  TM Distance: >3 FB Neck ROM: Full    Dental   Pulmonary shortness of breath and with exertion, COPDCurrent Smoker,    Pulmonary exam normal       Cardiovascular Normal cardiovascular exam    Neuro/Psych    GI/Hepatic (+)     substance abuse  alcohol use, Hepatitis -, C  Endo/Other    Renal/GU      Musculoskeletal   Abdominal   Peds  Hematology   Anesthesia Other Findings   Reproductive/Obstetrics                           Anesthesia Physical Anesthesia Plan  ASA: III and emergent  Anesthesia Plan: Regional   Post-op Pain Management:    Induction: Intravenous  Airway Management Planned: Natural Airway  Additional Equipment:   Intra-op Plan:   Post-operative Plan:   Informed Consent: I have reviewed the patients History and Physical, chart, labs and discussed the procedure including the risks, benefits and alternatives for the proposed anesthesia with the patient or authorized representative who has indicated his/her understanding and acceptance.     Plan Discussed with: CRNA and Surgeon  Anesthesia Plan Comments:         Anesthesia Quick Evaluation

## 2015-05-07 NOTE — ED Notes (Signed)
Per Victorino DikeJennifer, Secretary, Dr Amanda PeaGramig aware pt in ED.

## 2015-05-07 NOTE — Progress Notes (Signed)
Report given to BelgiumJenna, CRNA at bedside

## 2015-05-07 NOTE — Op Note (Signed)
See [id number]#[id number] Amanda PeaGramig MD

## 2015-05-07 NOTE — Transfer of Care (Signed)
Immediate Anesthesia Transfer of Care Note  Patient: Rhonda Mitchell  Procedure(s) Performed: Procedure(s): IRRIGATION AND DEBRIDEMENT  ELBOW  (Right)  Patient Location: PACU  Anesthesia Type:MAC and MAC combined with regional for post-op pain  Level of Consciousness: awake and alert   Airway & Oxygen Therapy: Patient Spontanous Breathing and Patient connected to nasal cannula oxygen  Post-op Assessment: Report given to RN and Post -op Vital signs reviewed and stable  Post vital signs: Reviewed and stable  Last Vitals:  Filed Vitals:   05/07/15 2014  BP:   Pulse:   Temp: 36.7 C  Resp:     Complications: No apparent anesthesia complications

## 2015-05-07 NOTE — ED Notes (Signed)
Meriam SpragueBeverly, Diplomatic Services operational officerecretary, paging Dr Amanda PeaGramig.

## 2015-05-07 NOTE — Anesthesia Postprocedure Evaluation (Signed)
Anesthesia Post Note  Patient: Rhonda Mitchell  Procedure(s) Performed: Procedure(s) (LRB): IRRIGATION AND DEBRIDEMENT  ELBOW  (Right)  Anesthesia type: regional  Patient location: PACU  Post pain: Pain level controlled  Post assessment: Patient's Cardiovascular Status Stable  Last Vitals:  Filed Vitals:   05/07/15 2137  BP: 127/62  Pulse: 83  Temp: 36.6 C  Resp: 20    Post vital signs: Reviewed and stable  Level of consciousness: awake  Complications: No apparent anesthesia complications

## 2015-05-07 NOTE — Anesthesia Procedure Notes (Addendum)
Anesthesia Regional Block:  Supraclavicular block  Pre-Anesthetic Checklist: ,, timeout performed, Correct Patient, Correct Site, Correct Laterality, Correct Procedure, Correct Position, site marked, Risks and benefits discussed,  Surgical consent,  Pre-op evaluation,  At surgeon's request and post-op pain management  Laterality: Right  Prep: chloraprep       Needles:  Injection technique: Single-shot  Needle Type: Echogenic Stimulator Needle     Needle Length: 9cm 9 cm Needle Gauge: 21 and 21 G    Additional Needles:  Procedures: ultrasound guided (picture in chart) and nerve stimulator Supraclavicular block  Nerve Stimulator or Paresthesia:  Response: 0.4 mA,   Additional Responses:   Narrative:  Start time: 05/07/2015 6:20 PM End time: 05/07/2015 6:35 PM Injection made incrementally with aspirations every 5 mL.  Performed by: Personally  Anesthesiologist: Arta BruceSSEY, KEVIN  Additional Notes: Monitors applied. Patient sedated. Sterile prep and drape,hand hygiene and sterile gloves were used. Relevant anatomy identified.Needle position confirmed.Local anesthetic injected incrementally after negative aspiration. Local anesthetic spread visualized around nerve(s). Vascular puncture avoided. No complications. Image printed for medical record.The patient tolerated the procedure well.        Procedure Name: MAC Date/Time: 05/07/2015 6:55 PM Performed by: Nicholos JohnsMCPHAIL, Avo Schlachter S Pre-anesthesia Checklist: Patient identified, Emergency Drugs available, Timeout performed, Suction available and Patient being monitored Patient Re-evaluated:Patient Re-evaluated prior to inductionOxygen Delivery Method: Nasal cannula

## 2015-05-07 NOTE — ED Notes (Signed)
IV team being paged by Diplomatic Services operational officersecretary. RN attempted x 1 - unsuccessful.

## 2015-05-07 NOTE — ED Notes (Addendum)
[facility] so may admit pt. Pt signed OR consent form - on chart.

## 2015-05-07 NOTE — ED Notes (Signed)
Per pharmacy, med sent to OR.

## 2015-05-07 NOTE — ED Notes (Signed)
Per Robby, RN, OR, cancel IV team, IV can be inserted in OR. Victorino DikeJennifer, Diplomatic Services operational officerecretary, paging IV Team.

## 2015-05-08 ENCOUNTER — Encounter (HOSPITAL_COMMUNITY): Payer: Self-pay

## 2015-05-08 DIAGNOSIS — M71021 Abscess of bursa, right elbow: Secondary | ICD-10-CM

## 2015-05-08 DIAGNOSIS — M71121 Other infective bursitis, right elbow: Secondary | ICD-10-CM | POA: Diagnosis present

## 2015-05-08 LAB — BASIC METABOLIC PANEL
ANION GAP: 9 (ref 5–15)
CO2: 28 mmol/L (ref 22–32)
Calcium: 9.1 mg/dL (ref 8.9–10.3)
Chloride: 96 mmol/L — ABNORMAL LOW (ref 101–111)
Creatinine, Ser: 0.44 mg/dL (ref 0.44–1.00)
GFR calc non Af Amer: >60 mL/min (ref >60)
GLUCOSE: 96 mg/dL (ref 65–99)
POTASSIUM: 3.9 mmol/L (ref 3.5–5.1)
Sodium: 133 mmol/L — ABNORMAL LOW (ref 135–145)

## 2015-05-08 LAB — MAGNESIUM: MAGNESIUM: 1.8 mg/dL (ref 1.7–2.4)

## 2015-05-08 LAB — CBC
HCT: 33.6 % — ABNORMAL LOW (ref 36.0–46.0)
HEMOGLOBIN: 11.2 g/dL — AB (ref 12.0–15.0)
MCH: 34.4 pg — ABNORMAL HIGH (ref 26.0–34.0)
MCHC: 33.3 g/dL (ref 30.0–36.0)
MCV: 103.1 fL — ABNORMAL HIGH (ref 78.0–100.0)
PLATELETS: 162 10*3/uL (ref 150–400)
RBC: 3.26 MIL/uL — AB (ref 3.87–5.11)
RDW: 14.1 % (ref 11.5–15.5)
WBC: 7.7 10*3/uL (ref 4.0–10.5)

## 2015-05-08 MED ORDER — PIPERACILLIN-TAZOBACTAM 3.375 G IVPB
3.3750 g | Freq: Three times a day (TID) | INTRAVENOUS | Status: DC
  Administered 2015-05-08 – 2015-05-10 (×5): 3.375 g via INTRAVENOUS
  Filled 2015-05-08 (×8): qty 50

## 2015-05-08 MED ORDER — METHOCARBAMOL 500 MG PO TABS
500.0000 mg | ORAL_TABLET | Freq: Four times a day (QID) | ORAL | Status: DC | PRN
  Administered 2015-05-08: 500 mg via ORAL
  Filled 2015-05-08: qty 1

## 2015-05-08 MED ORDER — HYDROMORPHONE HCL 1 MG/ML IJ SOLN
0.5000 mg | INTRAMUSCULAR | Status: DC | PRN
  Administered 2015-05-08 (×2): 1 mg via INTRAVENOUS
  Administered 2015-05-09: 0.5 mg via INTRAVENOUS
  Administered 2015-05-09 – 2015-05-11 (×6): 1 mg via INTRAVENOUS
  Filled 2015-05-08 (×9): qty 1

## 2015-05-08 MED ORDER — METHOCARBAMOL 1000 MG/10ML IJ SOLN
500.0000 mg | Freq: Four times a day (QID) | INTRAVENOUS | Status: DC | PRN
  Filled 2015-05-08: qty 5

## 2015-05-08 MED ORDER — SENNA 8.6 MG PO TABS
1.0000 | ORAL_TABLET | Freq: Two times a day (BID) | ORAL | Status: DC
  Administered 2015-05-08 – 2015-05-11 (×6): 8.6 mg via ORAL
  Filled 2015-05-08 (×6): qty 1

## 2015-05-08 MED ORDER — LORAZEPAM 1 MG PO TABS
1.0000 mg | ORAL_TABLET | ORAL | Status: DC | PRN
  Administered 2015-05-10 (×2): 1 mg via ORAL
  Filled 2015-05-08 (×2): qty 1

## 2015-05-08 MED ORDER — OXYCODONE HCL 5 MG PO TABS
5.0000 mg | ORAL_TABLET | ORAL | Status: DC | PRN
  Administered 2015-05-10: 5 mg via ORAL
  Administered 2015-05-11: 10 mg via ORAL
  Filled 2015-05-08: qty 1
  Filled 2015-05-08 (×2): qty 2

## 2015-05-08 MED ORDER — PNEUMOCOCCAL VAC POLYVALENT 25 MCG/0.5ML IJ INJ
0.5000 mL | INJECTION | INTRAMUSCULAR | Status: AC
  Administered 2015-05-10: 0.5 mL via INTRAMUSCULAR
  Filled 2015-05-08: qty 0.5

## 2015-05-08 MED ORDER — LORAZEPAM 2 MG/ML IJ SOLN
1.0000 mg | INTRAMUSCULAR | Status: DC | PRN
  Administered 2015-05-09 – 2015-05-11 (×5): 1 mg via INTRAVENOUS
  Filled 2015-05-08 (×5): qty 1

## 2015-05-08 NOTE — Progress Notes (Signed)
Subjective: 1 Day Post-Op Procedure(s) (LRB): IRRIGATION AND DEBRIDEMENT  ELBOW  (Right) Patient reports pain to the right upper extremity as well as the left wrist IV site. He complains of generalized pain and anxiety at this juncture. She states she feels feverish and overall very nervous. She states she did not sleep last night.  Objective: Vital signs in last 24 hours: Temp:  [97.9 F (36.6 C)-98.8 F (37.1 C)] 98.7 F (37.1 C) (05/18 1405) Pulse Rate:  [70-96] 96 (05/18 1800) Resp:  [18-20] 18 (05/18 1405) BP: (127-179)/(62-96) 164/91 mmHg (05/18 1800) SpO2:  [94 %-98 %] 98 % (05/18 1405)  Intake/Output from previous day: 05/17 0701 - 05/18 0700 In: 1393.8 [P.O.:60; I.V.:1333.8] Out: 15 [Blood:15] Intake/Output this shift:     Recent Labs  05/07/15 0856 05/08/15 0336  HGB 11.8* 11.2*    Recent Labs  05/07/15 0856 05/08/15 0336  WBC 9.1 7.7  RBC 3.41* 3.26*  HCT 35.0* 33.6*  PLT 156 162    Recent Labs  05/07/15 0856 05/08/15 0336  NA 132* 133*  K 3.2* 3.9  CL 93* 96*  CO2 24 28  BUN 6 <5*  CREATININE 0.51 0.44  GLUCOSE 98 96  CALCIUM 8.9 9.1   No results for input(s): LABPT, INR in the last 72 hours.  Patient is awake she is very tremulous, Ativan protocol has been initiated is obviously anxious and intermittently tearful Evaluation of the right upper extremity shows that her splint is clean dry and intact, she has mild swelling of the dorsal aspect of the hand but is maintained excellent digital range of motion. Sensation is intact. Evaluation of the right hand shows that her IV site is not optimally placed as this is at the distal wrist crease region, midline and in close proximity to the median nerve, line is frequently pinched with any flexion endeavors of the wrist no signs of infiltrate at this juncture  Assessment/Plan: 1 Day Post-Op Procedure(s) (LRB): IRRIGATION AND DEBRIDEMENT  ELBOW  (Right) We have discussed with the patient all issues  she will need hydrotherapy tomorrow and begin wound care. Therapy will remove her splint removed her in her dressing pull the drain and begin hydrotherapy followed by redressing the wound. Discussed with her the nature of her infection and our concerns. We will plan for better pain control adding by mouth oxycodone as well as IV Dilaudid as needed. IV Robaxin will be added as well. We have also last that her IV site be changed. In addition to vancomycin and Zosyn will be added for antibiotic prophylaxis until final cultures are obtained. We have discussed with the patient the issues regarding their infection to the extremity. We will continue antibiotics and await culture results. Often times it will take 3-5 days for cultures to become final. During this time we will typically have the patient on intravenous antibiotics until we can find a parenteral route of antibiotic regime specific for the bacteria or organism isolated. We have discussed with the patient the need for daily irrigation and debridement as well as therapy to the area. We have discussed with the patient the necessity of range of motion to the involved joints as discussed today. We have discussed with the patient the unpredictability of infections at times. We'll continue to work towards good pain control and restoration of function. The patient understands the need for meticulous wound care and the necessity of proper followup.  The possible complications of stiffness (loss of motion), resistant infection, possible deep bone  infection, possible chronic pain issues, possible need for multiple surgeries and even amputation.  With this in mind the patient understands our goal is to eradicate the infection to quiesence. We will continue to work towards these goals.  Rhonda Mitchell L 05/08/2015, 7:24 PM

## 2015-05-08 NOTE — Op Note (Signed)
NAMSherrilee Mitchell:  Rhonda, Mitchell            ACCOUNT NO.:  [id number]  MEDICAL RECORD NO.:  [id number]  LOCATION:  [id number]                        FACILITY:  [facility]  PHYSICIAN:  Dionne AnoWilliam M. Floriene Jeschke, M.D.DATE OF BIRTH:  1961-02-16  DATE OF PROCEDURE:  05/07/2015 DATE OF DISCHARGE:                              OPERATIVE REPORT   PREOPERATIVE DIAGNOSIS:  Infected right elbow olecranon bursa with draining fluid.  POSTOPERATIVE DIAGNOSIS:  Infected right elbow olecranon bursa with draining fluid.  PROCEDURE: 1. Irrigation and debridement, deep abscess, right elbow. 2. Extensive bursectomy of right elbow.  This was a complete     bursectomy of the elbow through a windowing technique. 3. Triceps tenolysis, tenosynovectomy, and debridement of the right     elbow.  SURGEON:  Dionne AnoWilliam M. Amanda PeaGramig, M.D.  ASSISTANT:  Karie ChimeraBrian Buchanan, P.A.-C.  COMPLICATION:  None.  ANESTHESIA:  Block anesthetic with IV sedation.  TOURNIQUET TIME:  Less than an hour.  DRAINS:  One Penrose drain.  INDICATIONS:  This female is 54 years of age.  She is certainly appearing  older than stated age and has a significant medical problems. I have counseled her in regard to risks and benefits of the surgery, including risk of infection, bleeding, anesthesia, and damage to normal structures.  She was brought to the operative theater after being [facility] and evaluated by myself.  [facility].  She was transferred [facility].  I have asked the hospitalist to see and treat as well.  Following preop discussion, she understood the gravity of her situation. All questions have been addressed.  DESCRIPTION OF PROCEDURE:  The patient was seen by myself and Anesthesia, given a block by Dr. Arta BruceKevin Ossey of Anesthesia.  She was prepped and draped in usual sterile fashion.  Betadine scrub and paint about the right upper extremity.  In the operative theater,  this was a 10-minute surgical Betadine scrub.  A block had been placed by Dr. Ceceilia Piperssey, which is in excellent working fashion.  She was placed in a modified lateral position.  Body parts well padded and after sterile field was secured and time-out was called,  the operation commenced with 2 incisions.  I used a windowing  technique for the incisions 1 distal and 1 proximal to the olecranon tip.  Through these incisions, I performed aerobic  and anaerobic cultures as well as tissue culture. Following culturing and irrigation debridement of the deep mass , I then set out on removal of a thickened bursa.  The patient underwent an extensive bursectomy, this was large bursa, approximately 6 x 6 cm.  I removed this through a windowing technique as I did not want to expose all of the soft tissues for fear of poor healing capabilities in this obvious poor healing individual.  Through the winding technique, we performed an extensive bursectomy as described.  Following this, we performed an extensive tenolysis and  tenosynovectomy of the triceps tendon.  She had dense adherence to the surrounding architecture.  We removed all of the infectious material from it and noted that it was competent.  Unfortunately, she did have raw bone exposed but there is no  evidence of osteomyelitis on plain film radiographs correlated preoperatively. Following this, we irrigated with greater than 3 L of saline.  Following this, we then performed very careful and cautious placement of Penrose drain through the windows and one lone suture in the most distal incision.   The sinus tract was curetted I should note, and she did present with a sinus tract of the olecranon tip.  I would give her a variable poor prognosis.  I feel that she needs IV antibiotics, general postop observation, and close monitoring.  We will begin Waterpik and wet-to-dry dressing changes.  She will need to be taut and adhered to this. I have  placed her on DVT precautions.  She tolerated the procedure well.  She had soft compartments.  She takes very poor care of herself, has multiple lesions,  history of MRSA, and I query whether she will be compliant  with her followup given her chart history; nevertheless, we are going to do everything in our power to give her an elbow that is not infected, and one that is capable and that she is competent with.  These notes have been discussed and all questions have been encouraged and answered.  She was dressed with sterile dressing and a posterior splint.     Dionne AnoWilliam M. Amanda PeaGramig, M.D.     [facility]/MED[facility]  D:  05/07/2015  T:  05/08/2015  Job:  [id number]

## 2015-05-08 NOTE — Progress Notes (Signed)
CONSULT PROGRESS NOTE  Rhonda Mitchell:[id number]:[id number] DOB: 1961/01/18 DOA: 05/07/2015 PCP: [name], [name], MD  Requesting physician: Dr. Amanda PeaGramig Date of consultation: 05/07/2015 Reason for consultation: Medical management   Subjective / 24 H Interval events - tremulous this morning  Assessment/Plan: Active Problems:   COPD (chronic obstructive pulmonary disease)   Hyponatremia   Septic bursitis of elbow   Thrombocytopenia   Chronic hepatitis C without hepatic coma   Hypokalemia   Abscess of bursa of elbow   Bursitis right elbow - s/p irrigation and debridement, deep abscess, right elbow with bursectomy of right elbow; and triceps tenolysis, tenosynovectomy, and debridement of the right elbow. - cultures pending - she has active ETOH abuse, history if IVDU "in the past" would not put PICC line. If ortho feels like she needs prolonged IV Abx will need to consult ID for consideration for Oritavancin. She is unlikely to accept SNF as she tells me today that she needs to go home ASAP as yesterday was her birthday and has a cabin rented in [place]. Poor insight.  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   Hyponatremia - likely a degree of volume depletion in setting of chronic liver disease - stable  Hypokalemia - replete - Mg OK  COPD - Clinically compensated without any wheezing - Clinically stable on room air - Continue Symbicort and Spiriva - Albuterol when necessary shortness of breath and wheeze  Tobacco abuse - Tobacco cessation discussed - NicoDerm  patch  Depression - Continue Cymbalta  I will followup again tomorrow. Please contact me if I can be of assistance in the meanwhile. Thank you for this consultation.   Diet: Diet regular Room service appropriate?: Yes; Fluid consistency:: Thin Fluids: none  DVT Prophylaxis: SCD  Code Status: Full Code Family Communication: d/w patient  Disposition Plan: per primary   Procedures:  I&D R elbow 5/17   Antibiotics Vancomycin 5/17 >>   Studies  Dg Elbow Complete Right  05/07/2015   CLINICAL DATA:  Larey SeatFell with swelling and oozing from the elbow.  EXAM: RIGHT ELBOW - COMPLETE 3+ VIEW  COMPARISON:  03/15/2015  FINDINGS: Negative for a fracture or dislocation. There is soft tissue swelling adjacent to the olecranon. No evidence for an elbow joint effusion.  IMPRESSION: Soft tissue swelling without acute bone abnormality.   Electronically Signed   By: Richarda OverlieAdam  Henn M.D.   On: 05/07/2015 09:26    Objective  Filed Vitals:   05/08/15 0526 05/08/15 0621 05/08/15 0958 05/08/15 1139  BP: 179/89 170/96 153/96 150/90  Pulse: 87  87 86  Temp: 98.4 F (36.9 C)  98.4 F (36.9 C)   TempSrc: Oral  Oral   Resp: 18  18   Height:      Weight:      SpO2: 98%  97%     Intake/Output Summary (Last 24 hours) at 05/08/15 1344 Last data filed at 05/08/15 0940  Gross per 24 hour  Intake 1513.75 ml  Output     15 ml  Net 1498.75 ml   Filed Weights   05/07/15 0840  Weight: 45.36 kg (100 lb)  Exam:  General:  NAD, tremulous  HEENT: no scleral icterus, PERRL  Cardiovascular: RRR without MRG, 2+ peripheral pulses, no edema  Respiratory: CTA biL, good air movement, no wheezing, no crackles, no rales  Abdomen: soft, non tender, BS +, no guarding  MSK/Extremities: no clubbing/cyanosis, no joint swelling, right wlbow wrapped  Skin: no rashes  Neuro: non focal  Data Reviewed: Basic Metabolic Panel:  Recent Labs Lab 05/07/15 0856 05/08/15 0336  NA 132* 133*  K 3.2* 3.9  CL 93* 96*   CO2 24 28  GLUCOSE 98 96  BUN 6 <5*  CREATININE 0.51 0.44  CALCIUM 8.9 9.1  MG  --  1.8   CBC:  Recent Labs Lab 05/07/15 0856 05/08/15 0336  WBC 9.1 7.7  NEUTROABS 6.9  --   HGB 11.8* 11.2*  HCT 35.0* 33.6*  MCV 102.6* 103.1*  PLT 156 162   BNP (last 3 results)  Recent Labs  03/18/15 2109  BNP 234.0*   CBG: No results for input(s): GLUCAP in the last 168 hours.  Recent Results (from the past 240 hour(s))  Tissue culture     Status: None (Preliminary result)   Collection Time: 05/07/15  7:24 PM  Result Value Ref Range Status   Specimen Description TISSUE  Final   Special Requests RIGHT ARM  Final   Gram Stain   Final    RARE WBC PRESENT,BOTH PMN AND MONONUCLEAR NO ORGANISMS SEEN Performed at [facility]    Culture PENDING  Incomplete   Report Status PENDING  Incomplete  Anaerobic culture     Status: None (Preliminary result)   Collection Time: 05/07/15  7:26 PM  Result Value Ref Range Status   Specimen Description ABSCESS  Final   Special Requests RIGHT ARM  Final   Gram Stain   Final    RARE WBC PRESENT,BOTH PMN AND MONONUCLEAR NO SQUAMOUS EPITHELIAL CELLS SEEN NO ORGANISMS SEEN Performed at [facility]    Culture   Final    NO ANAEROBES ISOLATED; CULTURE IN PROGRESS FOR 5 DAYS Performed at [facility]    Report Status PENDING  Incomplete  Culture, routine-abscess     Status: None (Preliminary result)   Collection Time: 05/07/15  7:26 PM  Result Value Ref Range Status   Specimen Description ABSCESS  Final   Special Requests RIGHT ARM  Final   Gram Stain   Final    RARE WBC PRESENT,BOTH PMN AND MONONUCLEAR NO SQUAMOUS EPITHELIAL CELLS SEEN NO ORGANISMS SEEN Performed at [facility]    Culture PENDING  Incomplete   Report Status PENDING  Incomplete     Scheduled Meds: . budesonide-formoterol  2 puff Inhalation BID  . bupivacaine (PF)  30 mL Infiltration To OR  . DULoxetine  60 mg Oral Daily  . folic acid   1 mg Oral Daily  . multivitamin with minerals  1 tablet Oral Daily  . nicotine  21 mg Transdermal QHS  . thiamine  100 mg Oral Daily   Or  . thiamine  100 mg Intravenous Daily  . tiotropium  18 mcg Inhalation Daily  . vancomycin  1,000 mg Intravenous Q24H  . vitamin C  1,000 mg Oral Daily   Continuous Infusions: . lactated ringers 10 mL/hr at 05/07/15 1742   Time spent: 25 minutes, > 50 % counseling  Pamella Pertostin Gherghe, MD [facility] Pager [phone]. If 7 PM - 7 AM, please contact night-coverage at [email or web address], password [email or web address] 05/08/2015, 1:44 PM

## 2015-05-09 DIAGNOSIS — J449 Chronic obstructive pulmonary disease, unspecified: Secondary | ICD-10-CM

## 2015-05-09 DIAGNOSIS — E876 Hypokalemia: Secondary | ICD-10-CM

## 2015-05-09 MED ORDER — LORAZEPAM 2 MG/ML IJ SOLN: 1.0000 mg | Freq: Four times a day (QID) | INTRAMUSCULAR | Status: DC | PRN

## 2015-05-09 MED ORDER — LORAZEPAM 2 MG/ML IJ SOLN
0.0000 mg | Freq: Two times a day (BID) | INTRAMUSCULAR | Status: DC
Start: 2015-05-11 — End: 2015-05-11
  Filled 2015-05-09: qty 1

## 2015-05-09 MED ORDER — LORAZEPAM 1 MG PO TABS
1.0000 mg | ORAL_TABLET | Freq: Four times a day (QID) | ORAL | Status: DC | PRN
Start: 2015-05-09 — End: 2015-05-09

## 2015-05-09 MED ORDER — HALOPERIDOL LACTATE 5 MG/ML IJ SOLN
5.0000 mg | Freq: Once | INTRAMUSCULAR | Status: AC
  Administered 2015-05-09: 5 mg via INTRAVENOUS
  Filled 2015-05-09: qty 1

## 2015-05-09 MED ORDER — LORAZEPAM 2 MG/ML IJ SOLN
0.0000 mg | Freq: Four times a day (QID) | INTRAMUSCULAR | Status: AC
  Administered 2015-05-09: 1 mg via INTRAVENOUS
  Administered 2015-05-10: 2 mg via INTRAVENOUS
  Administered 2015-05-10: 1 mg via INTRAVENOUS
  Filled 2015-05-09 (×5): qty 1

## 2015-05-09 NOTE — Progress Notes (Signed)
Subjective: 2 Days Post-Op Procedure(s) (LRB): IRRIGATION AND DEBRIDEMENT  ELBOW  (Right) Patient reports pain as better controlled in regards to her elbow. We have discussed all issues with her today. She had tolerated hydrotherapy without difficulties. She is feeling better overall per her description., Currently, therapy is working with her in terms of ambulation. She denies nausea or vomiting, fever or chills at this point in time    Objective: Vital signs in last 24 hours: Temp:  [97 F (36.1 C)-98.6 F (37 C)] 98.6 F (37 C) (05/19 1300) Pulse Rate:  [73-97] 83 (05/19 1300) Resp:  [16-18] 16 (05/19 1300) BP: (117-164)/(62-110) 117/62 mmHg (05/19 1300) SpO2:  [91 %-98 %] 93 % (05/19 1300)  Intake/Output from previous day: 05/18 0701 - 05/19 0700 In: 600 [P.O.:600] Out: -  Intake/Output this shift: Total I/O In: 180 [P.O.:180] Out: -    Recent Labs  05/07/15 0856 05/08/15 0336  HGB 11.8* 11.2*    Recent Labs  05/07/15 0856 05/08/15 0336  WBC 9.1 7.7  RBC 3.41* 3.26*  HCT 35.0* 33.6*  PLT 156 162    Recent Labs  05/07/15 0856 05/08/15 0336  NA 132* 133*  K 3.2* 3.9  CL 93* 96*  CO2 24 28  BUN 6 <5*  CREATININE 0.51 0.44  GLUCOSE 98 96  CALCIUM 8.9 9.1   No results for input(s): LABPT, INR in the last 72 hours.  Examination shows that she is awake, conversant with pressured speech, mildly tremulous HEENT atraumatic Chest equal expansions are present respirations are nonlabored Examination of the right upper extremity: Dressings are removed the wound is improved in regards to the swelling, there is no purulence she has no signs of ascending cellulitis and only has local hyperemia/erythema irrigation and debridement sites she is nontender to passive or active range of motion about the elbow Assessment/Plan: Plan: I have discussed with the patient all issues were going to continue daily wound care and her IV antibiotics. Final cultures are pending once we  the final cultures depending on her wound conditions will then transition her to by mouth antibiotics. Her questions were encouraged and answered Patient Active Problem List   Diagnosis Date Noted  . Septic olecranon bursitis of right elbow 05/08/2015  . Septic bursitis of elbow 05/07/2015  . Thrombocytopenia 05/07/2015  . Chronic hepatitis C without hepatic coma 05/07/2015  . Hypokalemia 05/07/2015  . Abscess of bursa of elbow 05/07/2015  . COPD exacerbation 03/18/2015  . Hyponatremia 03/18/2015  . Hyperglycemia 03/18/2015  . Alcohol dependence 12/07/2014  . Alcohol withdrawal 12/07/2014  . Protein-calorie malnutrition, severe 12/07/2014  . Musculoskeletal chest pain 12/07/2014  . Tobacco abuse 12/07/2014  . Carbuncle and furuncle 12/07/2014  . Macrocytic anemia 12/07/2014  . Chest pain 11/30/2014  . Tachycardia 11/30/2014  . COPD (chronic obstructive pulmonary disease) 11/30/2014  . Cellulitis and abscess 11/30/2014  . Atypical chest pain   . Dehydration    2 Days Post-Op Procedure(s) (LRB): IRRIGATION AND DEBRIDEMENT  ELBOW  (Right)   Anastazja Isaac L 05/09/2015, 5:10 PM

## 2015-05-09 NOTE — Evaluation (Signed)
Physical Therapy Evaluation Patient Details Name: Sherrilee GillesMichelle Bear MRN: [id number] DOB: [birth year] Today's Date: 05/09/2015   History of Present Illness  Pt admit for COPD exacerbation with respiratory disress.  Alcohol withdrawal as well. Septic bursitis of rt elbow, abscess of bursa of elbow, septic olecranon bursitis of right elbow.  PMH includes: Hyponatremia, Thrombocytopenia, Chronic hepatitis C without hepatic coma, Hypokalemia.   Clinical Impression  Patient demonstrates deficits in functional mobility as indicated below. Will need continued skilled PT to address deficits and maximize function. Will see as indicated and progress as tolerated.  Patient very unsteady with shakes and ataxia during mobility. Per OT family is aware and able to provide assist, plan to take her home despite recommendation for SNF.  Patient is high fall risk and demonstrates poor awareness of care for recovery.     Follow Up Recommendations Supervision/Assistance - 24 hour (refusing SNF)    Equipment Recommendations  Other (comment) (tbd)    Recommendations for Other Services       Precautions / Restrictions Precautions Precautions: Fall Restrictions Weight Bearing Restrictions: No      Mobility  Bed Mobility               General bed mobility comments: received in chair  Transfers Overall transfer level: Needs assistance Equipment used: Rolling walker (2 wheeled) Transfers: Sit to/from Stand Sit to Stand: Min assist         General transfer comment: min A for balance secondary to impulsivity  Ambulation/Gait Ambulation/Gait assistance: Min assist;+2 physical assistance Ambulation Distance (Feet): 240 Feet Assistive device: 2 person hand held assist Gait Pattern/deviations: Step-through pattern;Ataxic;Staggering left;Staggering right;Narrow base of support     General Gait Details: Significantly unsteady, ataxic gait with staggering LOB frequently  Stairs             Wheelchair Mobility    Modified Rankin (Stroke Patients Only)       Balance Overall balance assessment: Needs assistance Sitting-balance support: Feet supported Sitting balance-Leahy Scale: Poor Sitting balance - Comments: Pt with 1 incident of falling back while sitting EOB ~2 minutes needing assist from therpist. Pt reports this has happened to her PTA.   Standing balance support: Single extremity supported Standing balance-Leahy Scale: Poor Standing balance comment: gait belt and 2 persons to control LOB secondary to severe instability at times                             Pertinent Vitals/Pain Pain Assessment: No/denies pain    Home Living Family/patient expects to be discharged to:: Private residence Living Arrangements: Spouse/significant other Available Help at Discharge: Family;Available PRN/intermittently Type of Home: House Home Access: Ramped entrance     Home Layout: One level Home Equipment: Cane - single point;Cane - quad (rollator) Additional Comments: educated on sitting to bath/dress    Prior Function Level of Independence: Independent         Comments: Pt reports she did not use AD PTA. Previous note stated pt reported that she occasionally used AD to ambulate.     Hand Dominance   Dominant Hand: Right    Extremity/Trunk Assessment   Upper Extremity Assessment: RUE deficits/detail RUE Deficits / Details: s/p I&D of Rt elbow         Lower Extremity Assessment: RLE deficits/detail;LLE deficits/detail         Communication   Communication: Other (comment) (slurred speech)  Cognition Arousal/Alertness: Awake/alert Behavior During Therapy: Impulsive;Restless Overall Cognitive Status:  Impaired/Different from baseline Area of Impairment: Safety/judgement;Problem solving;Attention;Following commands   Current Attention Level: Sustained Memory: Decreased short-term memory Following Commands: Follows one step commands  inconsistently;Follows one step commands with increased time Safety/Judgement: Decreased awareness of safety;Decreased awareness of deficits   Problem Solving: Difficulty sequencing;Requires verbal cues;Requires tactile cues General Comments: Patient significantly impuslive getting tangled in wires, no awareness or insight    General Comments      Exercises Other Exercises Other Exercises: Reinforced education regarding positioning and edema control      Assessment/Plan    PT Assessment Patient needs continued PT services  PT Diagnosis Abnormality of gait   PT Problem List Decreased activity tolerance;Decreased balance;Decreased mobility;Decreased coordination;Decreased cognition;Decreased safety awareness;Pain  PT Treatment Interventions DME instruction;Gait training;Stair training;Functional mobility training;Therapeutic activities;Therapeutic exercise;Balance training;Patient/family education   PT Goals (Current goals can be found in the Care Plan section) Acute Rehab PT Goals Patient Stated Goal: home PT Goal Formulation: With patient Time For Goal Achievement: 05/23/15 Potential to Achieve Goals: Good    Frequency Min 3X/week   Barriers to discharge        Co-evaluation               End of Session Equipment Utilized During Treatment: Gait belt Activity Tolerance: No increased pain Patient left: in chair;with call bell/phone within reach;with nursing/sitter in room Nurse Communication: Mobility status         Time: [id number] PT Time Calculation (min) (ACUTE ONLY): 17 min   Charges:   PT Evaluation $Initial PT Evaluation Tier I: 1 Procedure     PT G CodesFabio Asa:        Larkin Morelos J 05/09/2015, 4:20 PM Charlotte Crumbevon Sennie Borden, PT DPT  [phone]

## 2015-05-09 NOTE — Progress Notes (Signed)
Physical Therapy Wound Treatment Patient Details  Name: Rhonda Mitchell MRN: [id number] Date of Birth: 27-May-1961  Today's Date: 05/09/2015 Time: 9509-3267 Time Calculation (min): 41 min  Subjective  Subjective: mumbling incoherently Patient and Family Stated Goals: Pt unable to stated due to confusion Prior Treatments: Surgical I&D  Pain Score: Pain Score: Pt confused, shaky, and restless. Pt was premedicated.  Wound Assessment  Wound / Incision (Open or Dehisced) 05/09/15 Incision - Open Elbow Right;Distal s/p I &D (Active)  Dressing Type ABD;Compression wrap;Gauze (Comment) 05/09/2015 10:20 AM  Dressing Changed Changed 05/09/2015 10:20 AM  Dressing Status New drainage;Old drainage 05/09/2015 10:20 AM  Dressing Change Frequency Daily 05/09/2015 10:20 AM  Site / Wound Assessment Bleeding;Red 05/09/2015 10:20 AM  % Wound base Red or Granulating 100% 05/09/2015 10:20 AM  % Wound base Yellow 0% 05/09/2015 10:20 AM  % Wound base Black 0% 05/09/2015 10:20 AM  % Wound base Other (Comment) 0% 05/09/2015 10:20 AM  Peri-wound Assessment Edema;Erythema (blanchable);Purple 05/09/2015 10:20 AM  Wound Length (cm) 1 cm 05/09/2015 10:20 AM  Wound Width (cm) 0.75 cm 05/09/2015 10:20 AM  Closure None 05/09/2015 10:20 AM  Drainage Amount Copious 05/09/2015 10:20 AM  Drainage Description Serosanguineous 05/09/2015 10:20 AM  Treatment Hydrotherapy (Pulse lavage) 05/09/2015 10:20 AM     Wound / Incision (Open or Dehisced) 05/09/15 Incision - Open Elbow Right;Proximal s/p I&D (Active)  Dressing Type ABD;Compression wrap;Gauze (Comment) 05/09/2015 10:20 AM  Dressing Changed Changed 05/09/2015 10:20 AM  Dressing Status New drainage;Old drainage 05/09/2015 10:20 AM  Dressing Change Frequency Daily 05/09/2015 10:20 AM  Site / Wound Assessment Bleeding;Red 05/09/2015 10:20 AM  % Wound base Red or Granulating 100% 05/09/2015 10:20 AM  % Wound base Yellow 0% 05/09/2015 10:20 AM  % Wound base Black 0% 05/09/2015 10:20 AM  %  Wound base Other (Comment) 0% 05/09/2015 10:20 AM  Peri-wound Assessment Edema;Erythema (blanchable);Purple 05/09/2015 10:20 AM  Wound Length (cm) 0.5 cm 05/09/2015 10:20 AM  Wound Width (cm) 0.5 cm 05/09/2015 10:20 AM  Margins Unattached edges (unapproximated) 05/09/2015 10:20 AM  Closure None 05/09/2015 10:20 AM  Drainage Amount Copious 05/09/2015 10:20 AM  Drainage Description Serosanguineous 05/09/2015 10:20 AM  Treatment Hydrotherapy (Pulse lavage) 05/09/2015 10:20 AM      Hydrotherapy Pulsed lavage therapy - wound location: rt elbow Pulsed Lavage with Suction (psi): 4 psi Pulsed Lavage with Suction - Normal Saline Used: 1000 mL Pulsed Lavage Tip: Tip with splash shield   Wound Assessment and Plan  Wound Therapy - Assess/Plan/Recommendations Wound Therapy - Clinical Statement: Pt presents with open connecting wounds s/p I&D to rt elbow. Can benefit from hydrotherapy to cleanse wound and promote healing. Wound Therapy - Functional Problem List: Decr use of RUE Factors Delaying/Impairing Wound Healing: Substance abuse;Infection - systemic/local Hydrotherapy Plan: Dressing change;Patient/family education;Pulsatile lavage with suction Wound Therapy - Frequency: 6X / week Wound Therapy - Follow Up Recommendations: Home health RN Wound Plan: See above  Wound Therapy Goals- Improve the function of patient's integumentary system by progressing the wound(s) through the phases of wound healing (inflammation - proliferation - remodeling) by: Improve Drainage Characteristics: Min;Serous Improve Drainage Characteristics - Progress: Goal set today Patient/Family will be able to : verbalize understanding of dressing changes Patient/Family Instruction Goal - Progress: Goal set today  Goals will be updated until maximal potential achieved or discharge criteria met.  Discharge criteria: when goals achieved, discharge from hospital, MD decision/surgical intervention, no progress towards goals,  refusal/missing three consecutive treatments without notification or medical reason.  GP  Keilani Terrance 05/09/2015, 10:33 AM  Suanne Marker PT [phone]

## 2015-05-09 NOTE — Progress Notes (Signed)
Occupational Therapy Evaluation Patient Details Name: Sherrilee GillesMichelle Gethers MRN: [id number] DOB: [birth year] Today's Date: 05/09/2015    History of Present Illness Pt admit for COPD exacerbation with respiratory disress.  Alcohol withdrawal as well. Septic bursitis of rt elbow, abscess of bursa of elbow, septic olecranon bursitis of right elbow.  PMH includes: Hyponatremia, Thrombocytopenia, Chronic hepatitis C without hepatic coma, Hypokalemia.    Clinical Impression   Pt admitted with the above diagnoses and presents with below problem list. Pt will benefit from continued OT to address the below listed deficits and maximize independence with BADLs prior to d/c to venue below. PTA pt reports she was independent with ADLs and did not use AD to ambulate. Pt presents with decreased balance and cognition impacting level of assist with ADLs. Pt is mod A for most ADLs; +2 safety/equipment for functional mobility. ADLs completed and education provided as detailed below. Recommend SNF at d/c. If pt d/c home then recommend HHOT, 3n1, and shower seat. OT to continue to follow acutely.     Follow Up Recommendations  SNF;Supervision/Assistance - 24 hour    Equipment Recommendations  Other (comment) (If d/c home recommend 3n1 and shower seat)    Recommendations for Other Services PT consult     Precautions / Restrictions Precautions Precautions: Fall Restrictions Weight Bearing Restrictions: No      Mobility Bed Mobility Overal bed mobility: Needs Assistance Bed Mobility: Supine to Sit;Sit to Supine     Supine to sit: Modified independent (Device/Increase time);HOB elevated Sit to supine: Modified independent (Device/Increase time);HOB elevated   General bed mobility comments: used rails  Transfers Overall transfer level: Needs assistance Equipment used: Rolling walker (2 wheeled) Transfers: Sit to/from Stand Sit to Stand: Min assist         General transfer comment: min A for  balance    Balance Overall balance assessment: Needs assistance;History of Falls Sitting-balance support: Feet supported Sitting balance-Leahy Scale: Poor Sitting balance - Comments: Pt with 1 incident of falling back while sitting EOB ~2 minutes needing assist from therpist. Pt reports this has happened to her PTA.   Standing balance support: Single extremity supported;During functional activity Standing balance-Leahy Scale: Poor Standing balance comment: pt needed mod A at gait belt to control balance during in-room ambulation. Pt very unsteady with notable swaying and LOBs.                             ADL Overall ADL's : Needs assistance/impaired Eating/Feeding: Set up;Sitting   Grooming: Set up;Sitting   Upper Body Bathing: Sitting;Minimal assitance;Min guard   Lower Body Bathing: Moderate assistance;Sit to/from stand   Upper Body Dressing : Min guard;Minimal assistance;Sitting   Lower Body Dressing: Moderate assistance;Sit to/from stand   Toilet Transfer: Moderate assistance;+2 for safety/equipment;Ambulation   Toileting- Clothing Manipulation and Hygiene: Moderate assistance;Sit to/from stand Toileting - Clothing Manipulation Details (indicate cue type and reason): for balance Tub/ Shower Transfer: Moderate assistance;Ambulation;+2 for safety/equipment   Functional mobility during ADLs: Moderate assistance;+2 for safety/equipment General ADL Comments: Pt presents with decreased balance and cognition impacting level of assist with ADLs. Pt completed bed mobility and in-room ambulation this session. Pt is mod A for most ADLs; +2 safety/equipment for functional mobility. Pt's spouse entered room at the end of OT session and reports pt had a fall recently and had some balance issues PTA. Discussed pt's performance this session and SNF recommendation. Pt strongly indicated she plans to go home and would  decline SNF.Reinforced the need for someone to stay with patient  24/7 at d/c and educated on fall prevention and safety with home setup and ADLs.      Vision     Perception     Praxis      Pertinent Vitals/Pain Pain Assessment: Faces Faces Pain Scale: Hurts even more Pain Location: Rt elbow Pain Intervention(s): Monitored during session;Limited activity within patient's tolerance;Repositioned     Hand Dominance Right   Extremity/Trunk Assessment Upper Extremity Assessment Upper Extremity Assessment: RUE deficits/detail RUE Deficits / Details: s/p I&D of Rt elbow RUE: Unable to fully assess due to immobilization   Lower Extremity Assessment Lower Extremity Assessment: Defer to PT evaluation       Communication Communication Communication: Other (comment) (slurred speech)   Cognition Arousal/Alertness: Lethargic Behavior During Therapy: Impulsive;Restless Overall Cognitive Status: Impaired/Different from baseline Area of Impairment: Safety/judgement;Problem solving;Attention;Following commands   Current Attention Level: Sustained Memory: Decreased short-term memory Following Commands: Follows one step commands inconsistently;Follows one step commands with increased time Safety/Judgement: Decreased awareness of safety;Decreased awareness of deficits   Problem Solving: Difficulty sequencing;Requires verbal cues;Requires tactile cues General Comments: Pt quickly ambulating once in standing position despite max cues from therapist to wait. Ambulating with no regard for IV lines/poles.    General Comments       Exercises Exercises: Other exercises Other Exercises Other Exercises: Educated on finger movements for edema control with pt giving good return demonstration.   Shoulder Instructions      Home Living Family/patient expects to be discharged to:: Private residence Living Arrangements: Spouse/significant other Available Help at Discharge: Family;Available PRN/intermittently Type of Home: House Home Access: Ramped entrance      Home Layout: One level     Bathroom Shower/Tub: Tub/shower unit         Home Equipment: Cane - single point;Cane - quad (rollator)   Additional Comments: educated on sitting to bath/dress      Prior Functioning/Environment Level of Independence: Independent        Comments: Pt reports she did not use AD PTA. Previous note stated pt reported that she occasionally used AD to ambulate.    OT Diagnosis: Cognitive deficits;Acute pain   OT Problem List: Decreased activity tolerance;Impaired balance (sitting and/or standing);Decreased cognition;Decreased safety awareness;Decreased knowledge of use of DME or AE;Decreased knowledge of precautions;Impaired UE functional use;Pain   OT Treatment/Interventions: Self-care/ADL training;Therapeutic exercise;Energy conservation;DME and/or AE instruction;Therapeutic activities;Cognitive remediation/compensation;Patient/family education;Balance training    OT Goals(Current goals can be found in the care plan section) Acute Rehab OT Goals Patient Stated Goal: home OT Goal Formulation: With patient/family Time For Goal Achievement: 05/16/15 Potential to Achieve Goals: Fair ADL Goals Pt Will Perform Upper Body Dressing: with modified independence;sitting Pt Will Perform Lower Body Dressing: sit to/from stand;with adaptive equipment;with min assist Pt Will Transfer to Toilet: ambulating;with mod assist;regular height toilet;grab bars Pt Will Perform Toileting - Clothing Manipulation and hygiene: sit to/from stand;with supervision Pt Will Perform Tub/Shower Transfer: with mod assist;ambulating;shower seat Additional ADL Goal #1: Pt will verbalize 2 edema control measures for RUE with mod I.  OT Frequency: Min 2X/week   Barriers to D/C:            Co-evaluation              End of Session Equipment Utilized During Treatment: Gait belt Nurse Communication: Mobility status  Activity Tolerance: Patient tolerated treatment well;Other  (comment) (impulsive) Patient left: in bed;with call bell/phone within reach;with nursing/sitter in room;with family/visitor present  Time: [id number] OT Time Calculation (min): 32 min Charges:  OT General Charges $OT Visit: 1 Procedure OT Evaluation $Initial OT Evaluation Tier I: 1 Procedure OT Treatments $Self Care/Home Management : 8-22 mins G-Codes:    Pilar GrammesMathews, Candies Palm H 05/09/2015, 1:03 PM

## 2015-05-09 NOTE — Progress Notes (Signed)
CONSULT PROGRESS NOTE  Rhonda GillesMichelle Mitchell XBJ:[id number] DOB: 28-Apr-1961 DOA: 05/07/2015 PCP: [name], [name], MD  Requesting physician: Dr. Amanda PeaGramig Date of consultation: 05/07/2015 Reason for consultation: Medical management   Subjective / 24 H Interval events - tremulous this morning, denies hallucinations, alert to place, person, thinks year is 2010 - pain controlled  Assessment/Plan: Active Problems:   COPD (chronic obstructive pulmonary disease)   Hyponatremia   Septic bursitis of elbow   Thrombocytopenia   Chronic hepatitis C without hepatic coma   Hypokalemia   Abscess of bursa of elbow   Septic olecranon bursitis of right elbow   Bursitis right elbow - s/p irrigation and debridement, deep abscess, right elbow with bursectomy of right elbow; and triceps tenolysis, tenosynovectomy, and debridement of the right elbow. 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   Hyponatremia - likely a degree of volume depletion in setting of chronic liver disease - stable  Hypokalemia - replete - Mg OK  COPD - Clinically compensated without any wheezing - Continue Symbicort and Spiriva, albuterol prn  Tobacco abuse - Tobacco cessation discussed 5/18 - NicoDerm patch  Depression - Continue Cymbalta  I will followup again tomorrow. Please contact me if I can be of assistance in the meanwhile. Thank you for this consultation.   Diet: Diet regular Room  service appropriate?: Yes; Fluid consistency:: Thin Fluids: none  DVT Prophylaxis: SCD  Code Status: Full Code Family Communication: d/w patient  Disposition Plan: per primary   Procedures:  I&D R elbow 5/17   Antibiotics Vancomycin 5/17 >>   Studies  No results found.  Objective  Filed Vitals:   05/08/15 2055 05/08/15 2214 05/09/15 0538 05/09/15 1019  BP: 154/90  137/110   Pulse: 97  73   Temp: 98.3 F (36.8 C)  97 F (36.1 C)   TempSrc: Oral  Axillary   Resp: 18  18   Height:      Weight:      SpO2: 98% 98% 91% 96%    Intake/Output Summary (Last 24 hours) at 05/09/15 1151 Last data filed at 05/08/15 1836  Gross per 24 hour  Intake    480 ml  Output      0 ml  Net    480 ml   Filed Weights   05/07/15 0840  Weight: 45.36 kg (100 lb)   Exam:  General:  NAD, tremulous  HEENT: no scleral icterus, PERRL  Cardiovascular: RRR without MRG, 2+ peripheral pulses, no edema  Respiratory: CTA biL, good air movement, no wheezing, no crackles, no rales  Abdomen: soft, non tender, BS +, no guarding  MSK/Extremities: no clubbing/cyanosis, no joint swelling, right wlbow wrapped  Skin: no rashes  Neuro: non focal  Data Reviewed: Basic Metabolic Panel:  Recent Labs Lab 05/07/15 0856 05/08/15 0336  NA 132* 133*  K 3.2* 3.9  CL 93* 96*  CO2 24 28  GLUCOSE 98 96  BUN 6 <5*  CREATININE 0.51 0.44  CALCIUM 8.9 9.1  MG  --  1.8   CBC:  Recent Labs Lab 05/07/15 0856 05/08/15 0336  WBC 9.1 7.7  NEUTROABS 6.9  --   HGB 11.8* 11.2*  HCT 35.0* 33.6*  MCV 102.6* 103.1*  PLT 156 162   BNP (last 3 results)  Recent Labs  03/18/15 2109  BNP 234.0*    Recent Results (from the past 240 hour(s))  Tissue culture     Status: None (Preliminary result)   Collection Time: 05/07/15  7:24 PM  Result Value Ref Range Status   Specimen Description TISSUE  Final   Special Requests RIGHT ARM  Final   Gram Stain   Final    RARE WBC PRESENT,BOTH PMN AND  MONONUCLEAR NO ORGANISMS SEEN Performed at [facility]    Culture   Final    FEW STAPHYLOCOCCUS AUREUS Note: RIFAMPIN AND GENTAMICIN SHOULD NOT BE USED AS SINGLE DRUGS FOR TREATMENT OF STAPH INFECTIONS. Performed at [facility]    Report Status PENDING  Incomplete  Anaerobic culture     Status: None (Preliminary result)   Collection Time: 05/07/15  7:26 PM  Result Value Ref Range Status   Specimen Description ABSCESS  Final   Special Requests RIGHT ARM  Final   Gram Stain   Final    RARE WBC PRESENT,BOTH PMN AND MONONUCLEAR NO SQUAMOUS EPITHELIAL CELLS SEEN NO ORGANISMS SEEN Performed at [facility]    Culture   Final    NO ANAEROBES ISOLATED; CULTURE IN PROGRESS FOR 5 DAYS Performed at [facility]    Report Status PENDING  Incomplete  Culture, routine-abscess     Status: None (Preliminary result)   Collection Time: 05/07/15  7:26 PM  Result Value Ref Range Status   Specimen Description ABSCESS  Final   Special Requests RIGHT ARM  Final   Gram Stain   Final    RARE WBC PRESENT,BOTH PMN AND MONONUCLEAR NO SQUAMOUS EPITHELIAL CELLS SEEN NO ORGANISMS SEEN Performed at [facility]    Culture   Final    MODERATE STAPHYLOCOCCUS AUREUS Note: RIFAMPIN AND GENTAMICIN SHOULD NOT BE USED AS SINGLE DRUGS FOR TREATMENT OF STAPH INFECTIONS. Performed at [facility]    Report Status PENDING  Incomplete  Culture, blood (routine x 2)     Status: None (Preliminary result)   Collection Time: 05/07/15 10:05 PM  Result Value Ref Range Status   Specimen Description BLOOD LEFT HAND  Final   Special Requests BOTTLES DRAWN AEROBIC ONLY 5CC  Final   Culture   Final           BLOOD CULTURE RECEIVED NO GROWTH TO DATE CULTURE WILL BE HELD FOR 5 DAYS BEFORE ISSUING A FINAL NEGATIVE REPORT Performed at [facility]    Report Status PENDING  Incomplete  Culture, blood (routine x 2)     Status: None (Preliminary result)   Collection  Time: 05/07/15 10:23 PM  Result Value Ref Range Status   Specimen Description BLOOD LEFT HAND  Final   Special Requests BOTTLES DRAWN AEROBIC ONLY 2CC  Final   Culture   Final           BLOOD CULTURE RECEIVED NO GROWTH TO DATE CULTURE WILL BE HELD FOR 5 DAYS BEFORE ISSUING A FINAL NEGATIVE REPORT Note: Culture results may be compromised due to an inadequate volume of blood received in culture bottles. Performed at [facility]    Report Status PENDING  Incomplete     Scheduled Meds: . budesonide-formoterol  2 puff Inhalation BID  .  DULoxetine  60 mg Oral Daily  . folic acid  1 mg Oral Daily  . LORazepam  0-4 mg Intravenous 4 times per day   Followed by  . [START ON 05/11/2015] LORazepam  0-4 mg Intravenous Q12H  . multivitamin with minerals  1 tablet Oral Daily  . nicotine  21 mg Transdermal QHS  . piperacillin-tazobactam (ZOSYN)  IV  3.375 g Intravenous Q8H  . pneumococcal 23 valent vaccine  0.5 mL Intramuscular Tomorrow-1000  . senna  1 tablet Oral BID  . thiamine  100 mg Oral Daily  . tiotropium  18 mcg Inhalation Daily  . vancomycin  1,000 mg Intravenous Q24H  . vitamin C  1,000 mg Oral Daily   Continuous Infusions: . lactated ringers 10 mL/hr at 05/07/15 1742   Pamella Pertostin Gherghe, MD [facility] Pager [phone]. If 7 PM - 7 AM, please contact night-coverage at [email or web address], password [email or web address] 05/09/2015, 11:51 AM  LOS: 1 day

## 2015-05-10 LAB — TISSUE CULTURE

## 2015-05-10 LAB — CULTURE, ROUTINE-ABSCESS

## 2015-05-10 MED ORDER — BACITRACIN-NEOMYCIN-POLYMYXIN OINTMENT TUBE
TOPICAL_OINTMENT | Freq: Every day | CUTANEOUS | Status: DC
  Administered 2015-05-11: 10:00:00 via TOPICAL
  Filled 2015-05-10: qty 15

## 2015-05-10 NOTE — Progress Notes (Signed)
CONSULT PROGRESS NOTE  Rhonda GillesMichelle Mitchell AOZ:[id number] DOB: 24-Sep-1961 DOA: 05/07/2015 PCP: [name], [name], MD  Requesting physician: Dr. Amanda PeaGramig Date of consultation: 05/07/2015 Reason for consultation: Medical management   Subjective / 24 H Interval events - tremors improved, much more alert today - endorses elbow pain - no chest pain, shortness of breath, no abdominal pain, nausea or vomiting.   Assessment/Plan: Active Problems:   COPD (chronic obstructive pulmonary disease)   Hyponatremia   Septic bursitis of elbow   Thrombocytopenia   Chronic hepatitis C without hepatic coma   Hypokalemia   Abscess of bursa of elbow   Septic olecranon bursitis of right elbow   Bursitis right elbow - s/p irrigation and debridement, deep abscess, right elbow with bursectomy of right elbow; and triceps tenolysis, tenosynovectomy, and debridement of the right elbow. - now undergoing irrigations - cultures with MRSA, will discontinue Zosyn, continue Vancomycin.  - per primary the decision when and if to transition to po antibiotics. MRSA is sensitive to Clinda and Bactrim.   Alcohol dependence/abuse - continue CIWA - Daily vitamin - Patient was discharged from the hospital on 03/25/2015 after an episode of alcohol withdrawal and DTs requiring Precedex - withdrawals better, more alert - tells me that she wishes to quit - this is stable, she looks clinically much better, she has been detox now for 3 days  Chronic hepatitis C - Unfortunate, the patient continues to drink 424 ounces of beer daily - She will need outpatient GI follow-up - Appears clinically compensated at present  Thrombocytopenia -The patient has chronic thrombocytopenia secondary to her hepatic disease;  - stable.   Hyponatremia - likely a degree of volume depletion in setting of chronic liver disease - stable  Hypokalemia - stable  COPD - Clinically compensated without any wheezing - Continue Symbicort and  Spiriva, albuterol prn  Tobacco abuse - Tobacco cessation discussed 5/18 - NicoDerm patch  Depression - Continue Cymbalta  I will followup again tomorrow. Please contact me if I can be of assistance in the meanwhile. Thank you for this consultation.   Diet: Diet regular Room service appropriate?: Yes; Fluid consistency:: Thin Fluids: none  DVT Prophylaxis: SCD  Code Status: Full Code Family Communication: d/w patient  Disposition Plan: per primary   Procedures:  I&D R elbow 5/17   Antibiotics Vancomycin 5/17 >>   Studies  No results found.  Objective  Filed Vitals:   05/09/15 2138 05/09/15 2200 05/10/15 0650 05/10/15 1124  BP:  106/74 121/71   Pulse:  90 68   Temp:  98.9 F (37.2 C) 98.3 F (36.8 C)   TempSrc:  Oral Oral   Resp:  18 18   Height:      Weight:      SpO2: 95% 95% 94% 94%    Intake/Output Summary (Last 24 hours) at 05/10/15 1258 Last data filed at 05/09/15 1800  Gross per 24 hour  Intake    300 ml  Output      0 ml  Net    300 ml   Filed Weights   05/07/15 0840  Weight: 45.36 kg (100 lb)   Exam:  General:  NAD, tremulous  HEENT: no scleral icterus, PERRL  Cardiovascular: RRR without MRG, 2+ peripheral pulses, no edema  Respiratory: CTA biL, good air movement, no wheezing, no crackles, no rales  Abdomen: soft, non tender, BS +, no guarding  MSK/Extremities: no clubbing/cyanosis, no joint swelling, right wlbow wrapped  Skin: no rashes  Neuro: non focal  Data Reviewed: Basic Metabolic Panel:  Recent Labs Lab 05/07/15 0856 05/08/15 0336  NA 132* 133*  K 3.2* 3.9  CL 93* 96*  CO2 24 28  GLUCOSE 98 96  BUN 6 <5*  CREATININE 0.51 0.44  CALCIUM 8.9 9.1  MG  --  1.8   CBC:  Recent Labs Lab 05/07/15 0856 05/08/15 0336  WBC 9.1 7.7  NEUTROABS 6.9  --   HGB 11.8* 11.2*  HCT 35.0* 33.6*  MCV 102.6* 103.1*  PLT 156 162   BNP (last 3 results)  Recent Labs  03/18/15 2109  BNP 234.0*    Recent Results  (from the past 240 hour(s))  Tissue culture     Status: None   Collection Time: 05/07/15  7:24 PM  Result Value Ref Range Status   Specimen Description TISSUE  Final   Special Requests RIGHT ARM  Final   Gram Stain   Final    RARE WBC PRESENT,BOTH PMN AND MONONUCLEAR NO ORGANISMS SEEN Performed at [facility]   Final    FEW METHICILLIN RESISTANT STAPHYLOCOCCUS AUREUS Note: RIFAMPIN AND GENTAMICIN SHOULD NOT BE USED AS SINGLE DRUGS FOR TREATMENT OF STAPH INFECTIONS. This organism DOES NOT demonstrate inducible Clindamycin resistance in vitro. CRITICAL RESULT CALLED TO, READ BACK BY AND VERIFIED WITH: JOAN K @ 0915 ON  [id number] BY [facility] Performed at [facility]    Report Status 05/10/2015 FINAL  Final   Organism ID, Bacteria METHICILLIN RESISTANT STAPHYLOCOCCUS AUREUS  Final      Susceptibility   Methicillin resistant staphylococcus aureus - MIC*    CLINDAMYCIN <=0.25 SENSITIVE Sensitive     ERYTHROMYCIN >=8 RESISTANT Resistant     GENTAMICIN <=0.5 SENSITIVE Sensitive     LEVOFLOXACIN 4 INTERMEDIATE Intermediate     OXACILLIN >=4 RESISTANT Resistant     PENICILLIN >=0.5 RESISTANT Resistant     RIFAMPIN <=0.5 SENSITIVE Sensitive     TRIMETH/SULFA <=10 SENSITIVE Sensitive     VANCOMYCIN <=0.5 SENSITIVE Sensitive     TETRACYCLINE <=1 SENSITIVE Sensitive     * FEW METHICILLIN RESISTANT STAPHYLOCOCCUS AUREUS  Anaerobic culture     Status: None (Preliminary result)   Collection Time: 05/07/15  7:26 PM  Result Value Ref Range Status   Specimen Description ABSCESS  Final   Special Requests RIGHT ARM  Final   Gram Stain   Final    RARE WBC PRESENT,BOTH PMN AND MONONUCLEAR NO SQUAMOUS EPITHELIAL CELLS SEEN NO ORGANISMS SEEN Performed at [facility]    Culture   Final    NO ANAEROBES ISOLATED; CULTURE IN PROGRESS FOR 5 DAYS Performed at [facility]    Report Status PENDING  Incomplete  Culture, routine-abscess     Status: None    Collection Time: 05/07/15  7:26 PM  Result Value Ref Range Status   Specimen Description ABSCESS  Final   Special Requests RIGHT ARM  Final   Gram Stain   Final    RARE WBC PRESENT,BOTH PMN AND MONONUCLEAR NO SQUAMOUS EPITHELIAL CELLS SEEN NO ORGANISMS SEEN Performed at [facility]    Culture   Final    MODERATE METHICILLIN RESISTANT STAPHYLOCOCCUS AUREUS Note: RIFAMPIN AND GENTAMICIN SHOULD NOT BE USED AS SINGLE DRUGS FOR TREATMENT OF STAPH INFECTIONS. This organism DOES NOT demonstrate inducible Clindamycin resistance in vitro. CRITICAL RESULT CALLED TO, READ BACK BY AND VERIFIED WITH: JOAN K @ 0915 ON  [id number] BY [facility] Performed at [facility]  Report Status 05/10/2015 FINAL  Final   Organism ID, Bacteria METHICILLIN RESISTANT STAPHYLOCOCCUS AUREUS  Final      Susceptibility   Methicillin resistant staphylococcus aureus - MIC*    CLINDAMYCIN <=0.25 SENSITIVE Sensitive     ERYTHROMYCIN >=8 RESISTANT Resistant     GENTAMICIN <=0.5 SENSITIVE Sensitive     LEVOFLOXACIN 4 INTERMEDIATE Intermediate     OXACILLIN >=4 RESISTANT Resistant     PENICILLIN >=0.5 RESISTANT Resistant     RIFAMPIN <=0.5 SENSITIVE Sensitive     TRIMETH/SULFA <=10 SENSITIVE Sensitive     VANCOMYCIN <=0.5 SENSITIVE Sensitive     TETRACYCLINE <=1 SENSITIVE Sensitive     * MODERATE METHICILLIN RESISTANT STAPHYLOCOCCUS AUREUS  Culture, blood (routine x 2)     Status: None (Preliminary result)   Collection Time: 05/07/15 10:05 PM  Result Value Ref Range Status   Specimen Description BLOOD LEFT HAND  Final   Special Requests BOTTLES DRAWN AEROBIC ONLY 5CC  Final   Culture   Final           BLOOD CULTURE RECEIVED NO GROWTH TO DATE CULTURE WILL BE HELD FOR 5 DAYS BEFORE ISSUING A FINAL NEGATIVE REPORT [facility]formed at [facility]    Report Status PENDING  Incomplete  Culture, blood (routine x 2)     Status: None (Preliminary result)   Collection Time: 05/07/15 10:23 PM  Result Value Ref  Range Status   Specimen Description BLOOD LEFT HAND  Final   Special Requests BOTTLES DRAWN AEROBIC ONLY 2CC  Final   Culture   Final           BLOOD CULTURE RECEIVED NO GROWTH TO DATE CULTURE WILL BE HELD FOR 5 DAYS BEFORE ISSUING A FINAL NEGATIVE REPORT Note: Culture results may be compromised due to an inadequate volume of blood received in culture bottles. Performed at [facility]    Report Status PENDING  Incomplete     Scheduled Meds: . budesonide-formoterol  2 puff Inhalation BID  . DULoxetine  60 mg Oral Daily  . folic acid  1 mg Oral Daily  . LORazepam  0-4 mg Intravenous 4 times per day   Followed by  . [START ON 05/11/2015] LORazepam  0-4 mg Intravenous Q12H  . multivitamin with minerals  1 tablet Oral Daily  . nicotine  21 mg Transdermal QHS  . senna  1 tablet Oral BID  . thiamine  100 mg Oral Daily  . tiotropium  18 mcg Inhalation Daily  . vancomycin  1,000 mg Intravenous Q24H  . vitamin C  1,000 mg Oral Daily   Continuous Infusions: . lactated ringers 20 mL/hr (05/10/15 1031)   Pamella Pertostin Renad Jenniges, MD [facility] Pager [phone]. If 7 PM - 7 AM, please contact night-coverage at [email or web address], password [email or web address] 05/10/2015, 12:58 PM  LOS: 2 days

## 2015-05-10 NOTE — Progress Notes (Signed)
ANTIBIOTIC CONSULT NOTE   Pharmacy Consult for vancomycin and zosyn Indication: septic bursitis  No Known Allergies  Patient Measurements: Height: 5' (152.4 cm) Weight: 100 lb (45.36 kg) IBW/kg (Calculated) : 45.5 Adjusted Body Weight: n/a  Vital Signs: Temp: 98.3 F (36.8 C) (05/20 0650) Temp Source: Oral (05/20 0650) BP: 121/71 mmHg (05/20 0650) Pulse Rate: 68 (05/20 0650) Intake/Output from previous day: 05/19 0701 - 05/20 0700 In: 300 [P.O.:300] Out: -  Intake/Output from this shift:    Labs:  Recent Labs  05/08/15 0336  WBC 7.7  HGB 11.2*  PLT 162  CREATININE 0.44   Estimated Creatinine Clearance: 57.6 mL/min (by C-G formula based on Cr of 0.44). No results for input(s): VANCOTROUGH, VANCOPEAK, VANCORANDOM, GENTTROUGH, GENTPEAK, GENTRANDOM, TOBRATROUGH, TOBRAPEAK, TOBRARND, AMIKACINPEAK, AMIKACINTROU, AMIKACIN in the last 72 hours.   Microbiology: Recent Results (from the past 720 hour(s))  Tissue culture     Status: None   Collection Time: 05/07/15  7:24 PM  Result Value Ref Range Status   Specimen Description TISSUE  Final   Special Requests RIGHT ARM  Final   Gram Stain   Final    RARE WBC PRESENT,BOTH PMN AND MONONUCLEAR NO ORGANISMS SEEN Performed at [facility]    Culture   Final    FEW METHICILLIN RESISTANT STAPHYLOCOCCUS AUREUS Note: RIFAMPIN AND GENTAMICIN SHOULD NOT BE USED AS SINGLE DRUGS FOR TREATMENT OF STAPH INFECTIONS. This organism DOES NOT demonstrate inducible Clindamycin resistance in vitro. CRITICAL RESULT CALLED TO, READ BACK BY AND VERIFIED WITH: JOAN K @ 0915 ON  [id number] [facility]formed at [facility]    Report Status 05/10/2015 FINAL  Final   Organism ID, Bacteria METHICILLIN RESISTANT STAPHYLOCOCCUS AUREUS  Final      Susceptibility   Methicillin resistant staphylococcus aureus - MIC*    CLINDAMYCIN <=0.25 SENSITIVE Sensitive     ERYTHROMYCIN >=8 RESISTANT Resistant     GENTAMICIN <=0.5 SENSITIVE  Sensitive     LEVOFLOXACIN 4 INTERMEDIATE Intermediate     OXACILLIN >=4 RESISTANT Resistant     PENICILLIN >=0.5 RESISTANT Resistant     RIFAMPIN <=0.5 SENSITIVE Sensitive     TRIMETH/SULFA <=10 SENSITIVE Sensitive     VANCOMYCIN <=0.5 SENSITIVE Sensitive     TETRACYCLINE <=1 SENSITIVE Sensitive     * FEW METHICILLIN RESISTANT STAPHYLOCOCCUS AUREUS  Anaerobic culture     Status: None (Preliminary result)   Collection Time: 05/07/15  7:26 PM  Result Value Ref Range Status   Specimen Description ABSCESS  Final   Special Requests RIGHT ARM  Final   Gram Stain   Final    RARE WBC PRESENT,BOTH PMN AND MONONUCLEAR NO SQUAMOUS EPITHELIAL CELLS SEEN NO ORGANISMS SEEN Performed at [facility]    Culture   Final    NO ANAEROBES ISOLATED; CULTURE IN PROGRESS FOR 5 DAYS Performed at [facility]    Report Status PENDING  Incomplete  Culture, routine-abscess     Status: None   Collection Time: 05/07/15  7:26 PM  Result Value Ref Range Status   Specimen Description ABSCESS  Final   Special Requests RIGHT ARM  Final   Gram Stain   Final    RARE WBC PRESENT,BOTH PMN AND MONONUCLEAR NO SQUAMOUS EPITHELIAL CELLS SEEN NO ORGANISMS SEEN Performed at [facility]    Culture   Final    MODERATE METHICILLIN RESISTANT STAPHYLOCOCCUS AUREUS Note: RIFAMPIN AND GENTAMICIN SHOULD NOT BE USED AS SINGLE DRUGS FOR TREATMENT OF STAPH INFECTIONS.  This organism DOES NOT demonstrate inducible Clindamycin resistance in vitro. CRITICAL RESULT CALLED TO, READ BACK BY AND VERIFIED WITH: JOAN K @ 0915 ON  [id number] [facility]formed at [facility]    Report Status 05/10/2015 FINAL  Final   Organism ID, Bacteria METHICILLIN RESISTANT STAPHYLOCOCCUS AUREUS  Final      Susceptibility   Methicillin resistant staphylococcus aureus - MIC*    CLINDAMYCIN <=0.25 SENSITIVE Sensitive     ERYTHROMYCIN >=8 RESISTANT Resistant     GENTAMICIN <=0.5 SENSITIVE Sensitive     LEVOFLOXACIN 4  INTERMEDIATE Intermediate     OXACILLIN >=4 RESISTANT Resistant     PENICILLIN >=0.5 RESISTANT Resistant     RIFAMPIN <=0.5 SENSITIVE Sensitive     TRIMETH/SULFA <=10 SENSITIVE Sensitive     VANCOMYCIN <=0.5 SENSITIVE Sensitive     TETRACYCLINE <=1 SENSITIVE Sensitive     * MODERATE METHICILLIN RESISTANT STAPHYLOCOCCUS AUREUS  Culture, blood (routine x 2)     Status: None (Preliminary result)   Collection Time: 05/07/15 10:05 PM  Result Value Ref Range Status   Specimen Description BLOOD LEFT HAND  Final   Special Requests BOTTLES DRAWN AEROBIC ONLY 5CC  Final   Culture   Final           BLOOD CULTURE RECEIVED NO GROWTH TO DATE CULTURE WILL BE HELD FOR 5 DAYS BEFORE ISSUING A FINAL NEGATIVE REPORT Performed at [facility]    Report Status PENDING  Incomplete  Culture, blood (routine x 2)     Status: None (Preliminary result)   Collection Time: 05/07/15 10:23 PM  Result Value Ref Range Status   Specimen Description BLOOD LEFT HAND  Final   Special Requests BOTTLES DRAWN AEROBIC ONLY 2CC  Final   Culture   Final           BLOOD CULTURE RECEIVED NO GROWTH TO DATE CULTURE WILL BE HELD FOR 5 DAYS BEFORE ISSUING A FINAL NEGATIVE REPORT Note: Culture results may be compromised due to an inadequate volume of blood received in culture bottles. Performed at [facility]    Report Status PENDING  Incomplete    Medical History: Past Medical History  Diagnosis Date  . COPD (chronic obstructive pulmonary disease)   . Anxiety   . Shortness of breath   . Arthritis   . GERD (gastroesophageal reflux disease)   . Full dentures   . Insomnia   . Alcohol abuse   . Hepatitis C   . Alcohol abuse   . History of MRSA infection   . History of encephalopathy     Assessment: 54 yo female admitted for septic bursitis on vancomycin and zosyn.  Abscess and tissue cultures few MRSA,. Afeb, WBC 7.7   Goal of Therapy:  Vancomycin trough level 15-20 mcg/ml  Plan:  1. Vancomycin 1g  IV q 24 hrs  2. Cont zosyn 3. Check vancomycin trough at steady-state as indicated.  Thanks for allowing pharmacy to be a part of this patient's care.  Talbert CageLora Jahayra Mazo, PharmD Clinical Pharmacist, [phone]  05/10/2015 9:51 AM

## 2015-05-10 NOTE — Progress Notes (Signed)
Physical Therapy Wound Treatment Patient Details  Name: Rhonda Mitchell MRN: [id number] Date of Birth: [birth year]  Today's Date: 05/10/2015 Time: 0086-7619 Time Calculation (min): 22 min  Subjective  Subjective: Mumbling and very difficult to understand.   Patient and Family Stated Goals: Pt unable to state due to confusion Prior Treatments: Surgical I&D  Pain Score: Pain Score: pt premedicated and does not show signs of pain.  Wound Assessment  Wound / Incision (Open or Dehisced) 12/01/14 Other (Comment) Buttocks Left Hard reddened dime sized area with open center no drainage painful to touch (Active)     Wound / Incision (Open or Dehisced) 05/09/15 Incision - Open Elbow Right;Distal s/p I &D (Active)  Dressing Type ABD;Compression wrap;Gauze (Comment) 05/10/2015  1:00 PM  Dressing Changed Changed 05/10/2015  1:00 PM  Dressing Status New drainage;Old drainage 05/10/2015  1:00 PM  Dressing Change Frequency Daily 05/10/2015  1:00 PM  Site / Wound Assessment Bleeding;Red 05/10/2015  1:00 PM  % Wound base Red or Granulating 100% 05/10/2015  1:00 PM  % Wound base Yellow 0% 05/10/2015  1:00 PM  % Wound base Black 0% 05/10/2015  1:00 PM  % Wound base Other (Comment) 0% 05/10/2015  1:00 PM  Peri-wound Assessment Edema;Erythema (blanchable);Purple 05/10/2015  1:00 PM  Wound Length (cm) 1 cm 05/09/2015 10:20 AM  Wound Width (cm) 0.75 cm 05/09/2015 10:20 AM  Closure None 05/10/2015  1:00 PM  Drainage Amount Copious 05/10/2015  1:00 PM  Drainage Description Serosanguineous 05/10/2015  1:00 PM  Treatment Hydrotherapy (Pulse lavage);Packing (Saline gauze) 05/10/2015  1:00 PM     Wound / Incision (Open or Dehisced) 05/09/15 Incision - Open Elbow Right;Proximal s/p I&D (Active)  Dressing Type ABD;Compression wrap;Gauze (Comment) 05/10/2015  1:00 PM  Dressing Changed Changed 05/10/2015  1:00 PM  Dressing Status New drainage;Old drainage 05/10/2015  1:00 PM  Dressing Change Frequency Daily 05/10/2015  1:00 PM   Site / Wound Assessment Bleeding;Red 05/10/2015  1:00 PM  % Wound base Red or Granulating 100% 05/10/2015  1:00 PM  % Wound base Yellow 0% 05/10/2015  1:00 PM  % Wound base Black 0% 05/10/2015  1:00 PM  % Wound base Other (Comment) 0% 05/10/2015  1:00 PM  Peri-wound Assessment Edema;Erythema (blanchable);Purple 05/10/2015  1:00 PM  Wound Length (cm) 0.5 cm 05/09/2015 10:20 AM  Wound Width (cm) 0.5 cm 05/09/2015 10:20 AM  Margins Unattached edges (unapproximated) 05/10/2015  1:00 PM  Closure None 05/10/2015  1:00 PM  Drainage Amount Copious 05/10/2015  1:00 PM  Drainage Description Serosanguineous 05/10/2015  1:00 PM  Treatment Hydrotherapy (Pulse lavage);Packing (Saline gauze) 05/10/2015  1:00 PM     Incision (Closed) 03/19/15 Arm Right;Medial (Active)   Hydrotherapy Pulsed lavage therapy - wound location: rt elbow Pulsed Lavage with Suction (psi): 4 psi Pulsed Lavage with Suction - Normal Saline Used: 1000 mL Pulsed Lavage Tip: Tip with splash shield   Wound Assessment and Plan  Wound Therapy - Assess/Plan/Recommendations Wound Therapy - Clinical Statement: Pt presents with open connecting wounds s/p I&D to rt elbow. Can benefit from hydrotherapy to cleanse wound and promote healing. Wound Therapy - Functional Problem List: Decr use of RUE Factors Delaying/Impairing Wound Healing: Substance abuse;Infection - systemic/local Hydrotherapy Plan: Dressing change;Patient/family education;Pulsatile lavage with suction Wound Therapy - Frequency: 6X / week Wound Therapy - Follow Up Recommendations: Home health RN Wound Plan: See above  Wound Therapy Goals- Improve the function of patient's integumentary system by progressing the wound(s) through the phases of wound healing (inflammation -  proliferation - remodeling) by: Improve Drainage Characteristics: Min;Serous Improve Drainage Characteristics - Progress: Progressing toward goal Patient/Family will be able to : verbalize understanding of dressing  changes Patient/Family Instruction Goal - Progress: Progressing toward goal  Goals will be updated until maximal potential achieved or discharge criteria met.  Discharge criteria: when goals achieved, discharge from hospital, MD decision/surgical intervention, no progress towards goals, refusal/missing three consecutive treatments without notification or medical reason.  GP     Gayanne Prescott, Thornton Papas, [place] 05/10/2015, 1:57 PM

## 2015-05-10 NOTE — Progress Notes (Signed)
Patient ID: Rhonda Mitchell, female   DOB: 31-Mar-1961, 54 y.o.   MRN: [id number] Patient seen and examined  The right elbow looks very good. Her erythema and cellulitis is markedly improved without comp caring features.  She has intact sensation and motion to the fingers.  I do feel that she has alcohol withdrawal issues ongoing at present. We appreciate the hospitalist management  Her cultures show staph aureus methicillin-resistant. I feel that once the family is capable she can be transitioned to outpatient wet-to-dry dressing changes and follow-up in my office  Currently her biggest concern is the multiple medical issues.  Her elbow is quite stable at this time.  Where it not for the medical issues I would DC her home at this juncture. Will discuss with medicine  Patient Active Problem List   Diagnosis Date Noted  . Septic olecranon bursitis of right elbow 05/08/2015  . Septic bursitis of elbow 05/07/2015  . Thrombocytopenia 05/07/2015  . Chronic hepatitis C without hepatic coma 05/07/2015  . Hypokalemia 05/07/2015  . Abscess of bursa of elbow 05/07/2015  . COPD exacerbation 03/18/2015  . Hyponatremia 03/18/2015  . Hyperglycemia 03/18/2015  . Alcohol dependence 12/07/2014  . Alcohol withdrawal 12/07/2014  . Protein-calorie malnutrition, severe 12/07/2014  . Musculoskeletal chest pain 12/07/2014  . Tobacco abuse 12/07/2014  . Carbuncle and furuncle 12/07/2014  . Macrocytic anemia 12/07/2014  . Chest pain 11/30/2014  . Tachycardia 11/30/2014  . COPD (chronic obstructive pulmonary disease) 11/30/2014  . Cellulitis and abscess 11/30/2014  . Atypical chest pain   . Dehydration

## 2015-05-11 MED ORDER — FOLIC ACID 1 MG PO TABS: 1.0000 mg | ORAL_TABLET | Freq: Every day | ORAL | Status: DC

## 2015-05-11 MED ORDER — ADULT MULTIVITAMIN W/MINERALS CH: 1.0000 | ORAL_TABLET | Freq: Every day | ORAL | Status: DC

## 2015-05-11 MED ORDER — THIAMINE HCL 100 MG PO TABS: 100.0000 mg | ORAL_TABLET | Freq: Every day | ORAL | Status: DC

## 2015-05-11 MED ORDER — CLINDAMYCIN HCL 300 MG PO CAPS: 300.0000 mg | ORAL_CAPSULE | Freq: Four times a day (QID) | ORAL | Status: DC

## 2015-05-11 MED ORDER — VITAMIN B-12 1000 MCG PO TABS: 1000.0000 ug | ORAL_TABLET | Freq: Every day | ORAL | Status: DC

## 2015-05-11 NOTE — Care Management Note (Signed)
Case Management Note  Patient Details  Name: Rhonda Mitchell MRN: [id number] Date of Birth: 1961/01/25  Subjective/Objective:                  infected right elbow olecranon bursa  Action/Plan:  Discharge planning Expected Discharge Date:  05/11/15               Expected Discharge Plan:  Home w Home Health Services  In-House Referral:     Discharge planning Services  CM Consult, MATCH Program, [facility], Medication Assistance  Post Acute Care Choice:    Choice offered to:  Patient  DME Arranged:    DME Agency:     HH Arranged:  RN HH Agency:  [facility]  Status of Service:  Completed, signed off  Medicare Important Message Given:    Date Medicare IM Given:    Medicare IM give by:    Date Additional Medicare IM Given:    Additional Medicare Important Message give by:     If discussed at Long Length of Stay Meetings, dates discussed:    Additional Comments: CM gave pt MATCH letter with list of participating pharmacies.  Pt verbalized understanding of all MATCH parameters.  CM gave pt [facility] pamphlet and pt verbalized understanding she will go to the clinic and weekday morning from 9-10 am and ask for' AN APPOINTMENT WITH A NAVIGATOR TO SECURE INSURANCE; AN APPOINTMENT TO SECURE A PRIMARY CARE PHYSICIAN; AN APPOINTMENT FOR FOLLOW UP MEDICAL CARE.  Pt will not qualify for HHPT but [facility] rep, [facility]. Best contact is pt's boyfriend, Fayrene FearingJames and he can be reached at [phone].  No other CM needs were communicated. Yves DillJeffries, Cassidie Veiga Christine, RN 05/11/2015, 2:47 PM

## 2015-05-11 NOTE — Progress Notes (Signed)
CONSULT PROGRESS NOTE  Rhonda Mitchell ZOX:[id number] DOB: [birth year] DOA: 05/07/2015 PCP: [name], MD  Requesting physician: Dr. Amanda Pea Date of consultation: 05/07/2015 Reason for consultation: Medical management   Subjective / 24 H Interval events - tremors stable, alert.  - endorses elbow pain - no chest pain, shortness of breath, no abdominal pain, nausea or vomiting.   Assessment/Plan: Active Problems:   COPD (chronic obstructive pulmonary disease)   Hyponatremia   Septic bursitis of elbow   Thrombocytopenia   Chronic hepatitis C without hepatic coma   Hypokalemia   Abscess of bursa of elbow   Septic olecranon bursitis of right elbow   Bursitis right elbow - s/p irrigation and debridement, deep abscess, right elbow with bursectomy of right elbow; and triceps tenolysis, tenosynovectomy, and debridement of the right elbow. - now undergoing irrigations - cultures with MRSA, will discontinue Zosyn, continue Vancomycin.  - per primary the decision when and if to transition to po antibiotics. MRSA is sensitive to Clinda and Bactrim.   Alcohol dependence/abuse - CIWA improving, she is now past 4 days without ETOH, long discussion with patient today, she has good knowledge of AA meetings and will attend on Monday - she has underlying tremor which is likely due to her long standing ETOH abuse with known cerebral atrophy - Daily vitamin, thiamine, B12 - stable for discharge  Chronic hepatitis C - Unfortunate, the patient continues to drink 424 ounces of beer daily - She will need outpatient GI follow-up - Appears clinically compensated at present, stable  Thrombocytopenia -The patient has chronic thrombocytopenia secondary to her hepatic disease;  - stable.   Hyponatremia - likely a degree of volume depletion in setting of chronic liver disease - stable  Hypokalemia - stable  COPD - Clinically compensated without any wheezing - Continue Symbicort and  Spiriva, albuterol prn  Tobacco abuse - Tobacco cessation discussed 5/18 - NicoDerm patch  Depression - Continue Cymbalta  I will followup again tomorrow. Please contact me if I can be of assistance in the meanwhile. Thank you for this consultation.   Diet: Diet regular Room service appropriate?: Yes; Fluid consistency:: Thin Fluids: none  DVT Prophylaxis: SCD  Code Status: Full Code Family Communication: d/w patient  Disposition Plan: per primary   Procedures:  I&D R elbow 5/17   Antibiotics Vancomycin 5/17 >>   Studies  No results found.  Objective  Filed Vitals:   05/10/15 1124 05/10/15 1406 05/10/15 2049 05/11/15 0553  BP:  123/82 128/75 146/81  Pulse:  83 80 83  Temp:  98 F (36.7 C) 97.4 F (36.3 C) 98.2 F (36.8 C)  TempSrc:  Oral Oral Oral  Resp:  Height:      Weight:      SpO2: 94% 93% 97% 93%    Intake/Output Summary (Last 24 hours) at 05/11/15 0948 Last data filed at 05/10/15 2147  Gross per 24 hour  Intake 1251.67 ml  Output      0 ml  Net 1251.67 ml   Filed Weights   05/07/15 0840  Weight: 45.36 kg (100 lb)   Exam:  General:  NAD, tremulous  HEENT: no scleral icterus, PERRL  Cardiovascular: RRR without MRG, 2+ peripheral pulses, no edema  Respiratory: CTA biL, good air movement, no wheezing, no crackles, no rales  Abdomen: soft, non tender, BS +, no guarding  MSK/Extremities: no clubbing/cyanosis, no joint swelling, right wlbow wrapped  Skin: no rashes  Neuro: non focal  Data Reviewed:  Basic Metabolic Panel:  Recent Labs Lab 05/07/15 0856 05/08/15 0336  NA 132* 133*  K 3.2* 3.9  CL 93* 96*  CO2 24 28  GLUCOSE 98 96  BUN 6 <5*  CREATININE 0.51 0.44  CALCIUM 8.9 9.1  MG  --  1.8   CBC:  Recent Labs Lab 05/07/15 0856 05/08/15 0336  WBC 9.1 7.7  NEUTROABS 6.9  --   HGB 11.8* 11.2*  HCT 35.0* 33.6*  MCV 102.6* 103.1*  PLT 156 162   BNP (last 3 results)  Recent Labs  03/18/15 2109  BNP  234.0*    Recent Results (from the past 240 hour(s))  Tissue culture     Status: None   Collection Time: 05/07/15  7:24 PM  Result Value Ref Range Status   Specimen Description TISSUE  Final   Special Requests RIGHT ARM  Final   Gram Stain   Final    RARE WBC PRESENT,BOTH PMN AND MONONUCLEAR NO ORGANISMS SEEN Performed at [facility]   Final    FEW METHICILLIN RESISTANT STAPHYLOCOCCUS AUREUS Note: RIFAMPIN AND GENTAMICIN SHOULD NOT BE USED AS SINGLE DRUGS FOR TREATMENT OF STAPH INFECTIONS. This organism DOES NOT demonstrate inducible Clindamycin resistance in vitro. CRITICAL RESULT CALLED TO, READ BACK BY AND VERIFIED WITH: JOAN K @ 0915 ON  [id number] BY [facility] Performed at [facility]    Report Status 05/10/2015 FINAL  Final   Organism ID, Bacteria METHICILLIN RESISTANT STAPHYLOCOCCUS AUREUS  Final      Susceptibility   Methicillin resistant staphylococcus aureus - MIC*    CLINDAMYCIN <=0.25 SENSITIVE Sensitive     ERYTHROMYCIN >=8 RESISTANT Resistant     GENTAMICIN <=0.5 SENSITIVE Sensitive     LEVOFLOXACIN 4 INTERMEDIATE Intermediate     OXACILLIN >=4 RESISTANT Resistant     PENICILLIN >=0.5 RESISTANT Resistant     RIFAMPIN <=0.5 SENSITIVE Sensitive     TRIMETH/SULFA <=10 SENSITIVE Sensitive     VANCOMYCIN <=0.5 SENSITIVE Sensitive     TETRACYCLINE <=1 SENSITIVE Sensitive     * FEW METHICILLIN RESISTANT STAPHYLOCOCCUS AUREUS  Anaerobic culture     Status: None (Preliminary result)   Collection Time: 05/07/15  7:26 PM  Result Value Ref Range Status   Specimen Description ABSCESS  Final   Special Requests RIGHT ARM  Final   Gram Stain   Final    RARE WBC PRESENT,BOTH PMN AND MONONUCLEAR NO SQUAMOUS EPITHELIAL CELLS SEEN NO ORGANISMS SEEN Performed at [facility]    Culture   Final    NO ANAEROBES ISOLATED; CULTURE IN PROGRESS FOR 5 DAYS Performed at [facility]    Report Status PENDING  Incomplete  Culture, routine-abscess      Status: None   Collection Time: 05/07/15  7:26 PM  Result Value Ref Range Status   Specimen Description ABSCESS  Final   Special Requests RIGHT ARM  Final   Gram Stain   Final    RARE WBC PRESENT,BOTH PMN AND MONONUCLEAR NO SQUAMOUS EPITHELIAL CELLS SEEN NO ORGANISMS SEEN Performed at [facility]    Culture   Final    MODERATE METHICILLIN RESISTANT STAPHYLOCOCCUS AUREUS Note: RIFAMPIN AND GENTAMICIN SHOULD NOT BE USED AS SINGLE DRUGS FOR TREATMENT OF STAPH INFECTIONS. This organism DOES NOT demonstrate inducible Clindamycin resistance in vitro. CRITICAL RESULT CALLED TO, READ BACK BY AND VERIFIED WITH: JOAN K @ 0915 ON  [id number] BY [facility] Performed at [facility]    Report  Status 05/10/2015 FINAL  Final   Organism ID, Bacteria METHICILLIN RESISTANT STAPHYLOCOCCUS AUREUS  Final      Susceptibility   Methicillin resistant staphylococcus aureus - MIC*    CLINDAMYCIN <=0.25 SENSITIVE Sensitive     ERYTHROMYCIN >=8 RESISTANT Resistant     GENTAMICIN <=0.5 SENSITIVE Sensitive     LEVOFLOXACIN 4 INTERMEDIATE Intermediate     OXACILLIN >=4 RESISTANT Resistant     PENICILLIN >=0.5 RESISTANT Resistant     RIFAMPIN <=0.5 SENSITIVE Sensitive     TRIMETH/SULFA <=10 SENSITIVE Sensitive     VANCOMYCIN <=0.5 SENSITIVE Sensitive     TETRACYCLINE <=1 SENSITIVE Sensitive     * MODERATE METHICILLIN RESISTANT STAPHYLOCOCCUS AUREUS  Culture, blood (routine x 2)     Status: None (Preliminary result)   Collection Time: 05/07/15 10:05 PM  Result Value Ref Range Status   Specimen Description BLOOD LEFT HAND  Final   Special Requests BOTTLES DRAWN AEROBIC ONLY 5CC  Final   Culture   Final           BLOOD CULTURE RECEIVED NO GROWTH TO DATE CULTURE WILL BE HELD FOR 5 DAYS BEFORE ISSUING A FINAL NEGATIVE REPORT [facility]formed at [facility]    Report Status PENDING  Incomplete  Culture, blood (routine x 2)     Status: None (Preliminary result)   Collection Time: 05/07/15 10:23 PM    Result Value Ref Range Status   Specimen Description BLOOD LEFT HAND  Final   Special Requests BOTTLES DRAWN AEROBIC ONLY 2CC  Final   Culture   Final           BLOOD CULTURE RECEIVED NO GROWTH TO DATE CULTURE WILL BE HELD FOR 5 DAYS BEFORE ISSUING A FINAL NEGATIVE REPORT Note: Culture results may be compromised due to an inadequate volume of blood received in culture bottles. Performed at [facility]    Report Status PENDING  Incomplete     Scheduled Meds: . budesonide-formoterol  2 puff Inhalation BID  . DULoxetine  60 mg Oral Daily  . folic acid  1 mg Oral Daily  . LORazepam  0-4 mg Intravenous Q12H  . multivitamin with minerals  1 tablet Oral Daily  . neomycin-bacitracin-polymyxin   Topical Daily  . nicotine  21 mg Transdermal QHS  . senna  1 tablet Oral BID  . thiamine  100 mg Oral Daily  . tiotropium  18 mcg Inhalation Daily  . vancomycin  1,000 mg Intravenous Q24H  . vitamin C  1,000 mg Oral Daily   Continuous Infusions: . lactated ringers 20 mL/hr (05/10/15 1031)   Pamella Pertostin Gherghe, MD [facility] Pager [phone]. If 7 PM - 7 AM, please contact night-coverage at [email or web address], password [email or web address] 05/11/2015, 9:48 AM  LOS: 3 days

## 2015-05-11 NOTE — Progress Notes (Signed)
Paged Freddy JakschSarah Jeffries, CM about home care needs.

## 2015-05-11 NOTE — Progress Notes (Signed)
Pt discharged to home accompy by boyfriend.  All DC instructions given and explained to pt. And boyfriend.  FU appts given and explained.  HHRN set up and Rx given and explained.  Supplies for dressing changes given.

## 2015-05-11 NOTE — Discharge Instructions (Signed)
Please take her antibiotics until they are complete Please cause for any problems Please call my office to be seen in 10 to 14 days Please make sure that you change her dressing daily and perform wet-to-dry dressing changes as instructed by Dr. Amanda PeaGramig     Keep bandage clean and dry.  Call for any problems.  No smoking.  Criteria for driving a car: you should be off your pain medicine for 7-8 hours, able to drive one handed(confident), thinking clearly and feeling able in your judgement to drive. Continue elevation as it will decrease swelling.  If instructed by MD move your fingers within the confines of the bandage/splint.  Use ice if instructed by your MD. Call immediately for any sudden loss of feeling in your hand/arm or change in functional abilities of the extremity.We recommend that you to take vitamin C 1000 mg a day to promote healing. We also recommend that if you require  pain medicine that you take a stool softener to prevent constipation as most pain medicines will have constipation side effects. We recommend either Peri-Colace or Senokot and recommend that you also consider adding MiraLAX to prevent the constipation affects from pain medicine if you are required to use them. These medicines are over the counter and maybe purchased at a local pharmacy. A cup of yogurt and a probiotic can also be helpful during the recovery process as the medicines can disrupt your intestinal environment.

## 2015-05-11 NOTE — Progress Notes (Signed)
PT Cancellation Note  Patient Details Name: Rhonda Mitchell MRN: [id number] DOB: 05/24/1961   Cancelled Treatment:    Reason Eval/Treat Not Completed: Other (comment).  Spoke with Dr Amanda PeaGramig - he had just changed patient's wound dressings.  He reports patient is being discharged today and does not need hydrotherapy since dressing just changed.   [name], Rodolfo Notaro H 05/11/2015, 3:27 PM [name], PT, [name]

## 2015-05-11 NOTE — Discharge Summary (Signed)
  Discharge summary  Patient was admitted postoperatively status post irrigation and debridement in the surgical theater for an infected right elbow olecranon bursa. She underwent debridement followed by daily debridement set bedside and [employer] whirlpools.  She's very challenging patient given her multiple medical issues. Ultimately she was stabilized in terms of her wound about the right upper extremity. She is now ready for discharge with wet-to-dry dressing changes. She has no obvious complications. She has no signs of urinary tract infection or DVT.  She is stable medically and is the best we've seen her.  I've counseled with the hospitalist in regards to her care and appreciate their help.  I discussed with she and her husband the plans for DC.  She will be discharged home on clindamycin 300 mg by mouth 4 times a day 3 weeks.  She will notify me should and problems occur.  She will continue elevation daily dressing changes instructed. We have recommended home health if it is approved for dressing care. I feel that given her balance issues Berry poor health and inability to drive and function that she is worthy and should qualify for the home care.  I'll see her in my office in 7-10 days.  I discussed all issues with her family. At time of discharge she was awake alert and oriented and tolerated a regular diet  She has clear lung fields heart regular rate abdomen is nontender.  She has a host of medical issues which she'll continue to contend with.  Final diagnosis status post irrigation and debridement infected right elbow olecranon bursa  Patient Active Problem List   Diagnosis Date Noted  . Septic olecranon bursitis of right elbow 05/08/2015  . Septic bursitis of elbow 05/07/2015  . Thrombocytopenia 05/07/2015  . Chronic hepatitis C without hepatic coma 05/07/2015  . Hypokalemia 05/07/2015  . Abscess of bursa of elbow 05/07/2015  . COPD exacerbation 03/18/2015  .  Hyponatremia 03/18/2015  . Hyperglycemia 03/18/2015  . Alcohol dependence 12/07/2014  . Alcohol withdrawal 12/07/2014  . Protein-calorie malnutrition, severe 12/07/2014  . Musculoskeletal chest pain 12/07/2014  . Tobacco abuse 12/07/2014  . Carbuncle and furuncle 12/07/2014  . Macrocytic anemia 12/07/2014  . Chest pain 11/30/2014  . Tachycardia 11/30/2014  . COPD (chronic obstructive pulmonary disease) 11/30/2014  . Cellulitis and abscess 11/30/2014  . Atypical chest pain   . Dehydration     Huber Mathers MD

## 2015-05-12 LAB — ANAEROBIC CULTURE

## 2015-05-14 LAB — CULTURE, BLOOD (ROUTINE X 2)
Culture: NO GROWTH
Culture: NO GROWTH

## 2015-05-23 ENCOUNTER — Inpatient Hospital Stay: Payer: Self-pay

## 2015-06-21 ENCOUNTER — Ambulatory Visit: Payer: Self-pay

## 2015-07-27 ENCOUNTER — Emergency Department (HOSPITAL_COMMUNITY): Payer: Self-pay

## 2015-07-27 ENCOUNTER — Emergency Department (HOSPITAL_COMMUNITY)
Admission: EM | Admit: 2015-07-27 | Discharge: 2015-07-27 | Disposition: A | Discharge status: Home / Self Care | Payer: Self-pay | Attending: Emergency Medicine | Admitting: Emergency Medicine

## 2015-07-27 ENCOUNTER — Encounter (HOSPITAL_COMMUNITY): Payer: Self-pay | Admitting: Nurse Practitioner

## 2015-07-27 DIAGNOSIS — M1990 Unspecified osteoarthritis, unspecified site: Secondary | ICD-10-CM | POA: Insufficient documentation

## 2015-07-27 DIAGNOSIS — Z8614 Personal history of Methicillin resistant Staphylococcus aureus infection: Secondary | ICD-10-CM | POA: Insufficient documentation

## 2015-07-27 DIAGNOSIS — E860 Dehydration: Secondary | ICD-10-CM | POA: Insufficient documentation

## 2015-07-27 DIAGNOSIS — Z8669 Personal history of other diseases of the nervous system and sense organs: Secondary | ICD-10-CM | POA: Insufficient documentation

## 2015-07-27 DIAGNOSIS — Z720 Tobacco use: Secondary | ICD-10-CM | POA: Insufficient documentation

## 2015-07-27 DIAGNOSIS — F419 Anxiety disorder, unspecified: Secondary | ICD-10-CM | POA: Insufficient documentation

## 2015-07-27 DIAGNOSIS — J449 Chronic obstructive pulmonary disease, unspecified: Secondary | ICD-10-CM | POA: Insufficient documentation

## 2015-07-27 DIAGNOSIS — Z8619 Personal history of other infectious and parasitic diseases: Secondary | ICD-10-CM | POA: Insufficient documentation

## 2015-07-27 DIAGNOSIS — Z792 Long term (current) use of antibiotics: Secondary | ICD-10-CM | POA: Insufficient documentation

## 2015-07-27 DIAGNOSIS — Z79899 Other long term (current) drug therapy: Secondary | ICD-10-CM | POA: Insufficient documentation

## 2015-07-27 DIAGNOSIS — K219 Gastro-esophageal reflux disease without esophagitis: Secondary | ICD-10-CM | POA: Insufficient documentation

## 2015-07-27 DIAGNOSIS — Z7951 Long term (current) use of inhaled steroids: Secondary | ICD-10-CM | POA: Insufficient documentation

## 2015-07-27 LAB — RAPID URINE DRUG SCREEN, HOSP PERFORMED
Amphetamines: NOT DETECTED
Barbiturates: NOT DETECTED
Benzodiazepines: NOT DETECTED
Cocaine: NOT DETECTED
Opiates: NOT DETECTED
Tetrahydrocannabinol: NOT DETECTED

## 2015-07-27 LAB — URINALYSIS, ROUTINE W REFLEX MICROSCOPIC
Bilirubin Urine: NEGATIVE
Glucose, UA: NEGATIVE mg/dL
Ketones, ur: NEGATIVE mg/dL
Nitrite: NEGATIVE
Protein, ur: NEGATIVE mg/dL
Specific Gravity, Urine: 1.01 (ref 1.005–1.030)
Urobilinogen, UA: 0.2 mg/dL (ref 0.0–1.0)
pH: 6 (ref 5.0–8.0)

## 2015-07-27 LAB — CBC WITH DIFFERENTIAL/PLATELET
Basophils Absolute: 0 10*3/uL (ref 0.0–0.1)
Basophils Relative: 1 % (ref 0–1)
Eosinophils Absolute: 0.1 10*3/uL (ref 0.0–0.7)
Eosinophils Relative: 1 % (ref 0–5)
HCT: 32.9 % — ABNORMAL LOW (ref 36.0–46.0)
Hemoglobin: 10.6 g/dL — ABNORMAL LOW (ref 12.0–15.0)
Lymphocytes Relative: 16 % (ref 12–46)
Lymphs Abs: 1 10*3/uL (ref 0.7–4.0)
MCH: 32.6 pg (ref 26.0–34.0)
MCHC: 32.2 g/dL (ref 30.0–36.0)
MCV: 101.2 fL — ABNORMAL HIGH (ref 78.0–100.0)
Monocytes Absolute: 0.5 10*3/uL (ref 0.1–1.0)
Monocytes Relative: 7 % (ref 3–12)
Neutro Abs: 4.9 10*3/uL (ref 1.7–7.7)
Neutrophils Relative %: 75 % (ref 43–77)
Platelets: 122 10*3/uL — ABNORMAL LOW (ref 150–400)
RBC: 3.25 MIL/uL — ABNORMAL LOW (ref 3.87–5.11)
RDW: 18.8 % — ABNORMAL HIGH (ref 11.5–15.5)
WBC: 6.5 10*3/uL (ref 4.0–10.5)

## 2015-07-27 LAB — COMPREHENSIVE METABOLIC PANEL
ALT: 47 U/L (ref 14–54)
AST: 200 U/L — ABNORMAL HIGH (ref 15–41)
Albumin: 3.5 g/dL (ref 3.5–5.0)
Alkaline Phosphatase: 171 U/L — ABNORMAL HIGH (ref 38–126)
Anion gap: 11 (ref 5–15)
BUN: 12 mg/dL (ref 6–20)
CO2: 24 mmol/L (ref 22–32)
Calcium: 8 mg/dL — ABNORMAL LOW (ref 8.9–10.3)
Chloride: 107 mmol/L (ref 101–111)
Creatinine, Ser: 1.2 mg/dL — ABNORMAL HIGH (ref 0.44–1.00)
GFR calc Af Amer: 58 mL/min — ABNORMAL LOW (ref >60)
GFR calc non Af Amer: 50 mL/min — ABNORMAL LOW (ref >60)
Glucose, Bld: 85 mg/dL (ref 65–99)
Potassium: 3.5 mmol/L (ref 3.5–5.1)
Sodium: 142 mmol/L (ref 135–145)
Total Bilirubin: 0.4 mg/dL (ref 0.3–1.2)
Total Protein: 7.7 g/dL (ref 6.5–8.1)

## 2015-07-27 LAB — URINE MICROSCOPIC-ADD ON

## 2015-07-27 LAB — LIPASE, BLOOD: Lipase: 56 U/L — ABNORMAL HIGH (ref 22–51)

## 2015-07-27 LAB — ETHANOL: Alcohol, Ethyl (B): 337 mg/dL (ref <5)

## 2015-07-27 MED ORDER — SODIUM CHLORIDE 0.9 % IV BOLUS (SEPSIS)
1000.0000 mL | Freq: Once | INTRAVENOUS | Status: AC
  Administered 2015-07-27: 1000 mL via INTRAVENOUS

## 2015-07-27 MED ORDER — CLINDAMYCIN HCL 150 MG PO CAPS: 450.0000 mg | ORAL_CAPSULE | Freq: Three times a day (TID) | ORAL | Status: DC

## 2015-07-27 MED ORDER — LORAZEPAM 2 MG/ML IJ SOLN
1.0000 mg | Freq: Once | INTRAMUSCULAR | Status: AC
  Administered 2015-07-27: 1 mg via INTRAVENOUS
  Filled 2015-07-27: qty 1

## 2015-07-27 NOTE — Discharge Instructions (Signed)
Return here as needed.  Follow-up with Dr. Amanda Pea for recheck

## 2015-07-27 NOTE — ED Notes (Signed)
Bed: WA08 Expected date:  Expected time:  Means of arrival:  Comments: EMS- syncopal episode, ETOH

## 2015-07-27 NOTE — ED Notes (Signed)
Pt is presented from home, report of syncope episode, report of head impact, positive of LOC per witness, pt states she "falls often" chat reviews do not indicate a neurological hx, pt is intoxicated and endorses alcoholism. Last drink today 1000 hrs.

## 2015-07-27 NOTE — ED Provider Notes (Signed)
CSN: [id number]     Arrival date & time 07/27/15  1505 History   First MD Initiated Contact with Patient 07/27/15 1520     Chief Complaint  Patient presents with  . Near Syncope     (Consider location/radiation/quality/duration/timing/severity/associated sxs/prior Treatment) HPI   Patient is a 54 year old female with PMHx of COPD, anxiety, shortness of breath, arthritis, alcohol abuse, and Hep C who presents to the ED after sustaining a fall at home.  Per a witness at home, patient lost consciousness.  Patient is seen at bedside, unaccompanied.  Patient states that she has been drinking alcohol today.  She states she typically drinks a fifth of vodka daily.  Patient is restless and provides an incomplete history.  She endorses pain "all over".  Patient denies chest pain, shortness of breath, nausea, vomiting, weakness, dizziness, headache, blurred vision, back pain, neck pain, fever or syncope  Past Medical History  Diagnosis Date  . COPD (chronic obstructive pulmonary disease)   . Anxiety   . Shortness of breath   . Arthritis   . GERD (gastroesophageal reflux disease)   . Full dentures   . Insomnia   . Alcohol abuse   . Hepatitis C   . Alcohol abuse   . History of MRSA infection   . History of encephalopathy    Past Surgical History  Procedure Laterality Date  . Arm surgery    . Hand surgery  1990    both rt/lt carpal tunnels  . Shoulder surgery      right and left-age [age]  . Orif ulnar / radial shaft fracture  1990    right-got infection-had total 10 surgeies 1990  . Orif ulnar fracture Left 05/23/2014    Procedure: OPEN REDUCTION INTERNAL FIXATION (ORIF) LEFT ULNAR FRACTURE;  Surgeon: Harvie Junior, MD;  Location: [facility];  Service: Orthopedics;  Laterality: Left;  . I&d extremity Right 05/07/2015    Procedure: IRRIGATION AND DEBRIDEMENT  ELBOW ;  Surgeon: Dominica Severin, MD;  Location: [facility] OR;  Service: Orthopedics;  Laterality: Right;   Family History    Problem Relation Age of Onset  . Breast cancer Mother    History  Substance Use Topics  . Smoking status: Current Every Day Smoker -- 1.00 packs/day for 20 years    Types: Cigarettes  . Smokeless tobacco: Never Used  . Alcohol Use: 1.8 oz/week    3 Glasses of wine per week     Comment: 6-pack a week   OB History    No data available     Review of Systems  All other systems negative except as documented in the HPI. All pertinent positives and negatives as reviewed in the HPI.=  Allergies  Review of patient's allergies indicates no known allergies.  Home Medications   Prior to Admission medications   Medication Sig Start Date End Date Taking? Authorizing Provider  albuterol-ipratropium (COMBIVENT) 18-103 MCG/ACT inhaler Inhale 2 puffs into the lungs every 6 (six) hours as needed for wheezing. 03/25/15  Yes Shanker Levora Dredge, MD  budesonide-formoterol (SYMBICORT) 80-4.5 MCG/ACT inhaler Inhale 2 puffs into the lungs 2 (two) times daily. 03/25/15  Yes Shanker Levora Dredge, MD  DULoxetine (CYMBALTA) 30 MG capsule Take 60 mg by mouth daily.   Yes Historical Provider, MD  fluticasone (FLONASE) 50 MCG/ACT nasal spray Place 1-2 sprays into both nostrils daily as needed for allergies.   Yes Historical Provider, MD  folic acid (FOLVITE) 1 MG tablet Take 1 tablet (  1 mg total) by mouth daily. 05/11/15  Yes Costin Otelia Sergeant, MD  ibuprofen (ADVIL,MOTRIN) 200 MG tablet Take 400-600 mg by mouth every 6 (six) hours as needed (knee pain).   Yes Historical Provider, MD  MAGNESIUM PO Take 1 tablet by mouth daily.   Yes Historical Provider, MD  Multiple Vitamin (MULTIVITAMIN WITH MINERALS) TABS tablet Take 1 tablet by mouth daily. 05/11/15  Yes Costin Otelia Sergeant, MD  Potassium (POTASSIMIN PO) Take 1 tablet by mouth daily.   Yes Historical Provider, MD  thiamine 100 MG tablet Take 1 tablet (100 mg total) by mouth daily. 05/11/15  Yes Costin Otelia Sergeant, MD  tiotropium (SPIRIVA) 18 MCG inhalation capsule Place 1  capsule (18 mcg total) into inhaler and inhale daily. 03/25/15  Yes Shanker Levora Dredge, MD  vitamin B-12 (CYANOCOBALAMIN) 1000 MCG tablet Take 1 tablet (1,000 mcg total) by mouth daily. 05/11/15  Yes Costin Otelia Sergeant, MD  clindamycin (CLEOCIN) 300 MG capsule Take 1 capsule (300 mg total) by mouth 4 (four) times daily. Patient not taking: Reported on 07/27/2015 05/11/15   Dominica Severin, MD   BP 103/81 mmHg  Pulse 98  Temp(Src) 98.9 F (37.2 C) (Oral)  Resp 19  SpO2 96% Physical Exam  Constitutional: She is oriented to person, place, and time. She appears well-developed and well-nourished. No distress.  HENT:  Head: Normocephalic and atraumatic.  Mouth/Throat: Oropharynx is clear and moist.  Eyes: Pupils are equal, round, and reactive to light.  Neck: Normal range of motion. Neck supple.  Cardiovascular: Normal rate, regular rhythm and normal heart sounds.  Exam reveals no gallop and no friction rub.   No murmur heard. Pulmonary/Chest: Effort normal and breath sounds normal. No respiratory distress.  Musculoskeletal:       Right elbow: She exhibits no swelling, no effusion and no deformity.       Arms: Neurological: She is alert and oriented to person, place, and time. She exhibits normal muscle tone. Coordination normal.  Skin: Skin is warm and dry.    ED Course  Procedures (including critical care time) Labs Review Labs Reviewed  URINE RAPID DRUG SCREEN, HOSP PERFORMED  URINALYSIS, ROUTINE W REFLEX MICROSCOPIC (NOT AT [facility])  CBC WITH DIFFERENTIAL/PLATELET  COMPREHENSIVE METABOLIC PANEL  LIPASE, BLOOD  ETHANOL    Imaging Review Ct Head Wo Contrast  07/27/2015   CLINICAL DATA:  Acute onset of syncope. Loss of consciousness. Multiple recent falls. Initial encounter.  EXAM: CT HEAD WITHOUT CONTRAST  TECHNIQUE: Contiguous axial images were obtained from the base of the skull through the vertex without intravenous contrast.  COMPARISON:  CT of the head performed 03/15/2015  FINDINGS:  There is no evidence of acute infarction, mass lesion, or intra- or extra-axial hemorrhage on CT.  Prominence of the ventricles and sulci reflects mild to moderate cortical volume loss. Cerebellar atrophy is noted. Scattered periventricular white matter change likely reflects small vessel ischemic microangiopathy.  The brainstem and fourth ventricle are within normal limits. The basal ganglia are unremarkable in appearance. The cerebral hemispheres demonstrate grossly normal gray-white differentiation. No mass effect or midline shift is seen.  There is no evidence of fracture; visualized osseous structures are unremarkable in appearance. The orbits are within normal limits. The paranasal sinuses and mastoid air cells are well-aerated. No significant soft tissue abnormalities are seen.  IMPRESSION: 1. No evidence of traumatic intracranial injury or fracture. 2. Mild to moderate cortical volume loss and scattered small vessel ischemic microangiopathy.   Electronically Signed  By: Roanna Raider M.D.   On: 07/27/2015 17:41     EKG Interpretation None      I advised the patient that she will need to see Dr. Gentry Roch. for follow-up of her elbow.  She has full range of motion.  This time and there is a wound of the elbow.  She states there for quite a while, got worse over the last couple weeks.   Charlestine Night, PA-C 07/27/15 2111  Samuel Jester, DO 07/31/15 [phone]

## 2015-07-27 NOTE — ED Notes (Signed)
Pt ambulatory to restroom without assistance. Gait steady 

## 2015-07-27 NOTE — ED Notes (Signed)
Patient transported to CT, anticipate delay in lab draw, first attempt unsuccessful.

## 2015-07-27 NOTE — ED Notes (Signed)
Pt continuing to be restless, moving around and getting in and out of bed. Pt complaining about machines beeping. Told patient due to her constant moving around the machines are having a hard time reading her vitals. Pt continues to take off monitoring equipment, instructed multiple times to keep the equipment on. Warm blanket given per request and pt hooked back up to the monitor.

## 2015-08-03 ENCOUNTER — Emergency Department (HOSPITAL_COMMUNITY): Payer: Self-pay

## 2015-08-03 ENCOUNTER — Inpatient Hospital Stay (HOSPITAL_COMMUNITY)
Admission: EM | Admit: 2015-08-03 | Discharge: 2015-08-28 | DRG: 917 | Disposition: A | Discharge status: Other Acute Inpatient Hospital | Payer: Self-pay | Attending: Internal Medicine | Admitting: Internal Medicine

## 2015-08-03 ENCOUNTER — Encounter (HOSPITAL_COMMUNITY): Payer: Self-pay

## 2015-08-03 DIAGNOSIS — F10931 Alcohol use, unspecified with withdrawal delirium: Secondary | ICD-10-CM

## 2015-08-03 DIAGNOSIS — F10939 Alcohol use, unspecified with withdrawal, unspecified: Secondary | ICD-10-CM | POA: Diagnosis present

## 2015-08-03 DIAGNOSIS — D7589 Other specified diseases of blood and blood-forming organs: Secondary | ICD-10-CM | POA: Diagnosis present

## 2015-08-03 DIAGNOSIS — E871 Hypo-osmolality and hyponatremia: Secondary | ICD-10-CM | POA: Diagnosis present

## 2015-08-03 DIAGNOSIS — Z781 Physical restraint status: Secondary | ICD-10-CM

## 2015-08-03 DIAGNOSIS — Z79899 Other long term (current) drug therapy: Secondary | ICD-10-CM

## 2015-08-03 DIAGNOSIS — Y908 Blood alcohol level of 240 mg/100 ml or more: Secondary | ICD-10-CM | POA: Diagnosis present

## 2015-08-03 DIAGNOSIS — B1920 Unspecified viral hepatitis C without hepatic coma: Secondary | ICD-10-CM | POA: Diagnosis present

## 2015-08-03 DIAGNOSIS — R740 Nonspecific elevation of levels of transaminase and lactic acid dehydrogenase [LDH]: Secondary | ICD-10-CM | POA: Diagnosis present

## 2015-08-03 DIAGNOSIS — Z7951 Long term (current) use of inhaled steroids: Secondary | ICD-10-CM

## 2015-08-03 DIAGNOSIS — K623 Rectal prolapse: Secondary | ICD-10-CM | POA: Diagnosis present

## 2015-08-03 DIAGNOSIS — J441 Chronic obstructive pulmonary disease with (acute) exacerbation: Secondary | ICD-10-CM | POA: Diagnosis present

## 2015-08-03 DIAGNOSIS — K6289 Other specified diseases of anus and rectum: Secondary | ICD-10-CM | POA: Diagnosis present

## 2015-08-03 DIAGNOSIS — F10239 Alcohol dependence with withdrawal, unspecified: Secondary | ICD-10-CM | POA: Diagnosis present

## 2015-08-03 DIAGNOSIS — E861 Hypovolemia: Secondary | ICD-10-CM | POA: Diagnosis present

## 2015-08-03 DIAGNOSIS — F1020 Alcohol dependence, uncomplicated: Secondary | ICD-10-CM | POA: Diagnosis present

## 2015-08-03 DIAGNOSIS — R739 Hyperglycemia, unspecified: Secondary | ICD-10-CM | POA: Diagnosis present

## 2015-08-03 DIAGNOSIS — F29 Unspecified psychosis not due to a substance or known physiological condition: Secondary | ICD-10-CM | POA: Clinically undetermined

## 2015-08-03 DIAGNOSIS — E876 Hypokalemia: Secondary | ICD-10-CM | POA: Diagnosis present

## 2015-08-03 DIAGNOSIS — K219 Gastro-esophageal reflux disease without esophagitis: Secondary | ICD-10-CM | POA: Diagnosis present

## 2015-08-03 DIAGNOSIS — R296 Repeated falls: Secondary | ICD-10-CM | POA: Diagnosis present

## 2015-08-03 DIAGNOSIS — R945 Abnormal results of liver function studies: Secondary | ICD-10-CM

## 2015-08-03 DIAGNOSIS — R768 Other specified abnormal immunological findings in serum: Secondary | ICD-10-CM | POA: Diagnosis present

## 2015-08-03 DIAGNOSIS — B182 Chronic viral hepatitis C: Secondary | ICD-10-CM | POA: Diagnosis present

## 2015-08-03 DIAGNOSIS — E43 Unspecified severe protein-calorie malnutrition: Secondary | ICD-10-CM | POA: Diagnosis present

## 2015-08-03 DIAGNOSIS — Z794 Long term (current) use of insulin: Secondary | ICD-10-CM

## 2015-08-03 DIAGNOSIS — J9601 Acute respiratory failure with hypoxia: Secondary | ICD-10-CM | POA: Diagnosis present

## 2015-08-03 DIAGNOSIS — F17210 Nicotine dependence, cigarettes, uncomplicated: Secondary | ICD-10-CM | POA: Diagnosis present

## 2015-08-03 DIAGNOSIS — R64 Cachexia: Secondary | ICD-10-CM | POA: Diagnosis present

## 2015-08-03 DIAGNOSIS — Z716 Tobacco abuse counseling: Secondary | ICD-10-CM | POA: Diagnosis present

## 2015-08-03 DIAGNOSIS — Z8614 Personal history of Methicillin resistant Staphylococcus aureus infection: Secondary | ICD-10-CM

## 2015-08-03 DIAGNOSIS — D539 Nutritional anemia, unspecified: Secondary | ICD-10-CM | POA: Diagnosis present

## 2015-08-03 DIAGNOSIS — T510X1A Toxic effect of ethanol, accidental (unintentional), initial encounter: Principal | ICD-10-CM | POA: Diagnosis present

## 2015-08-03 DIAGNOSIS — M1990 Unspecified osteoarthritis, unspecified site: Secondary | ICD-10-CM | POA: Diagnosis present

## 2015-08-03 DIAGNOSIS — E46 Unspecified protein-calorie malnutrition: Secondary | ICD-10-CM | POA: Insufficient documentation

## 2015-08-03 DIAGNOSIS — R7989 Other specified abnormal findings of blood chemistry: Secondary | ICD-10-CM | POA: Diagnosis present

## 2015-08-03 DIAGNOSIS — E875 Hyperkalemia: Secondary | ICD-10-CM | POA: Diagnosis present

## 2015-08-03 DIAGNOSIS — Z791 Long term (current) use of non-steroidal anti-inflammatories (NSAID): Secondary | ICD-10-CM

## 2015-08-03 DIAGNOSIS — F10231 Alcohol dependence with withdrawal delirium: Secondary | ICD-10-CM | POA: Diagnosis present

## 2015-08-03 LAB — I-STAT ARTERIAL BLOOD GAS, ED
ACID-BASE EXCESS: 1 mmol/L (ref 0.0–2.0)
Bicarbonate: 27.2 mEq/L — ABNORMAL HIGH (ref 20.0–24.0)
O2 SAT: 94 %
PCO2 ART: 52.8 mmHg — AB (ref 35.0–45.0)
PO2 ART: 78 mmHg — AB (ref 80.0–100.0)
TCO2: 29 mmol/L (ref 0–100)
pH, Arterial: 7.319 — ABNORMAL LOW (ref 7.350–7.450)

## 2015-08-03 LAB — HEPATIC FUNCTION PANEL
ALT: 67 U/L — ABNORMAL HIGH (ref 14–54)
AST: 226 U/L — AB (ref 15–41)
Albumin: 3 g/dL — ABNORMAL LOW (ref 3.5–5.0)
Alkaline Phosphatase: 201 U/L — ABNORMAL HIGH (ref 38–126)
BILIRUBIN INDIRECT: 0.2 mg/dL — AB (ref 0.3–0.9)
Bilirubin, Direct: 0.3 mg/dL (ref 0.1–0.5)
TOTAL PROTEIN: 6.3 g/dL — AB (ref 6.5–8.1)
Total Bilirubin: 0.5 mg/dL (ref 0.3–1.2)

## 2015-08-03 LAB — CBC
HCT: 29.6 % — ABNORMAL LOW (ref 36.0–46.0)
HEMOGLOBIN: 9.7 g/dL — AB (ref 12.0–15.0)
MCH: 33.2 pg (ref 26.0–34.0)
MCHC: 32.8 g/dL (ref 30.0–36.0)
MCV: 101.4 fL — ABNORMAL HIGH (ref 78.0–100.0)
PLATELETS: 128 10*3/uL — AB (ref 150–400)
RBC: 2.92 MIL/uL — ABNORMAL LOW (ref 3.87–5.11)
RDW: 19.8 % — AB (ref 11.5–15.5)
WBC: 7.3 10*3/uL (ref 4.0–10.5)

## 2015-08-03 LAB — BASIC METABOLIC PANEL
ANION GAP: 17 — AB (ref 5–15)
BUN: 8 mg/dL (ref 6–20)
CO2: 21 mmol/L — ABNORMAL LOW (ref 22–32)
CREATININE: 0.97 mg/dL (ref 0.44–1.00)
Calcium: 7.8 mg/dL — ABNORMAL LOW (ref 8.9–10.3)
Chloride: 99 mmol/L — ABNORMAL LOW (ref 101–111)
GFR calc Af Amer: >60 mL/min (ref >60)
GFR calc non Af Amer: >60 mL/min (ref >60)
Glucose, Bld: 207 mg/dL — ABNORMAL HIGH (ref 65–99)
Potassium: 3.1 mmol/L — ABNORMAL LOW (ref 3.5–5.1)
Sodium: 137 mmol/L (ref 135–145)

## 2015-08-03 LAB — AMMONIA: AMMONIA: 39 umol/L — AB (ref 9–35)

## 2015-08-03 LAB — BRAIN NATRIURETIC PEPTIDE: B Natriuretic Peptide: 55.1 pg/mL (ref 0.0–100.0)

## 2015-08-03 LAB — ETHANOL: Alcohol, Ethyl (B): 436 mg/dL (ref <5)

## 2015-08-03 MED ORDER — ALBUTEROL SULFATE (2.5 MG/3ML) 0.083% IN NEBU
5.0000 mg | INHALATION_SOLUTION | Freq: Once | RESPIRATORY_TRACT | Status: AC
  Administered 2015-08-03: 5 mg via RESPIRATORY_TRACT

## 2015-08-03 MED ORDER — LORAZEPAM 1 MG PO TABS: 0.0000 mg | ORAL_TABLET | Freq: Two times a day (BID) | ORAL | Status: DC

## 2015-08-03 MED ORDER — MAGNESIUM SULFATE 2 GM/50ML IV SOLN
2.0000 g | Freq: Once | INTRAVENOUS | Status: AC
Start: 2015-08-03 — End: 2015-08-04
  Administered 2015-08-03: 2 g via INTRAVENOUS
  Filled 2015-08-03: qty 50

## 2015-08-03 MED ORDER — LORAZEPAM 1 MG PO TABS: 0.0000 mg | ORAL_TABLET | Freq: Four times a day (QID) | ORAL | Status: DC

## 2015-08-03 MED ORDER — ALBUTEROL (5 MG/ML) CONTINUOUS INHALATION SOLN
10.0000 mg/h | INHALATION_SOLUTION | Freq: Once | RESPIRATORY_TRACT | Status: AC
  Administered 2015-08-03: 10 mg/h via RESPIRATORY_TRACT
  Filled 2015-08-03: qty 20

## 2015-08-03 MED ORDER — LORAZEPAM 2 MG/ML IJ SOLN
0.0000 mg | Freq: Four times a day (QID) | INTRAMUSCULAR | Status: DC
  Administered 2015-08-03: 2 mg via INTRAVENOUS

## 2015-08-03 MED ORDER — METHYLPREDNISOLONE SODIUM SUCC 125 MG IJ SOLR
125.0000 mg | Freq: Once | INTRAMUSCULAR | Status: AC
  Administered 2015-08-03: 125 mg via INTRAVENOUS
  Filled 2015-08-03: qty 2

## 2015-08-03 MED ORDER — ALBUTEROL SULFATE (2.5 MG/3ML) 0.083% IN NEBU
INHALATION_SOLUTION | RESPIRATORY_TRACT | Status: AC
  Filled 2015-08-03: qty 6

## 2015-08-03 MED ORDER — ONDANSETRON 4 MG PO TBDP
4.0000 mg | ORAL_TABLET | Freq: Once | ORAL | Status: AC
  Administered 2015-08-03: 4 mg via ORAL
  Filled 2015-08-03: qty 1

## 2015-08-03 MED ORDER — VITAMIN B-1 100 MG PO TABS: 100.0000 mg | ORAL_TABLET | Freq: Every day | ORAL | Status: DC

## 2015-08-03 MED ORDER — LORAZEPAM 2 MG/ML IJ SOLN
0.0000 mg | Freq: Two times a day (BID) | INTRAMUSCULAR | Status: DC
  Filled 2015-08-03: qty 1

## 2015-08-03 MED ORDER — THIAMINE HCL 100 MG/ML IJ SOLN: 100.0000 mg | Freq: Every day | INTRAMUSCULAR | Status: DC

## 2015-08-03 MED ORDER — IBUPROFEN 400 MG PO TABS
400.0000 mg | ORAL_TABLET | Freq: Once | ORAL | Status: AC
  Administered 2015-08-03: 400 mg via ORAL
  Filled 2015-08-03: qty 1

## 2015-08-03 MED ORDER — MORPHINE SULFATE 10 MG/ML IJ SOLN
10.0000 mg | Freq: Once | INTRAMUSCULAR | Status: AC
Start: 2015-08-03 — End: 2015-08-03
  Administered 2015-08-03: 10 mg via INTRAMUSCULAR
  Filled 2015-08-03: qty 1

## 2015-08-03 NOTE — ED Notes (Signed)
CRITICAL VALUE ALERT  Critical value received:  ETOH - 436  Date of notification:  08/03/15  Time of notification:  1825  MD notified:  Fayrene Fearing, MD

## 2015-08-03 NOTE — ED Notes (Signed)
Pt been very rude and using bad words, screaming and yelling, pt very restless and combative, MD notified and security called.

## 2015-08-03 NOTE — ED Notes (Signed)
Pt stated she was unable to stand on the weight at this time due to knee pain.

## 2015-08-03 NOTE — ED Notes (Signed)
Patient does not want an IV; yelling and cursing at nurse to not stick her.

## 2015-08-03 NOTE — ED Notes (Signed)
Patient asleep; even, unlabored respirations. Arouses to stimulation. When asleep sats dropped to 67. Placed on [place] 2L; improved to 92%

## 2015-08-03 NOTE — ED Notes (Signed)
Patient transported to X-ray 

## 2015-08-03 NOTE — ED Notes (Addendum)
Pt reports rectal prolapse x 3 days.  Pt went to [facility] walk in and was referred to ED.  Pt reports drinking 1/5th Vodka.  Pt c/o shortness of breath, states she has missed her COPD medications today.

## 2015-08-03 NOTE — ED Notes (Signed)
Patient returned from X-ray 

## 2015-08-03 NOTE — H&P (Signed)
[facility] History and Physical  Rhonda Mitchell WUJ:[id number] DOB: 1961/09/04 DOA: 08/03/2015  Referring physician: Dr. Fayrene Fearing PCP: [name], MD   Chief Complaint: SOB  HPI: Rhonda Mitchell is a 54 y.o. female past medical history of alcohol abuse ongoing tobacco abuse and COPD non-oxygen dependent at comes in for shortness of breath that started several days prior to admission. The patient is sleepy and cannot provide a history so most of the history is obtained from the chart. As per chart the patient had been short of breath last several days. She denies any cough or fever.  In the ED: She was found to be hypoxic and was placed on a nonrebreather and her oxygen saturations came up, she was given an hour-long treatment of albuterol but she was still desatted into the 70s. Chest x-ray was done that shows no infiltrates, her LFTs are mildly elevated, she was also found with an alcohol level of 436 she was started on Ativan protocol and she is only responsive to pain.   Review of Systems:  Cannot assess as patient is only responsive to pain she wakes up for which short period of time and can answer 1 or 2 questions and then goes back to sleep.  Past Medical History  Diagnosis Date  . COPD (chronic obstructive pulmonary disease)   . Anxiety   . Shortness of breath   . Arthritis   . GERD (gastroesophageal reflux disease)   . Full dentures   . Insomnia   . Alcohol abuse   . Hepatitis C   . Alcohol abuse   . History of MRSA infection   . History of encephalopathy    Past Surgical History  Procedure Laterality Date  . Arm surgery    . Hand surgery  1990    both rt/lt carpal tunnels  . Shoulder surgery      right and left-age [age]  . Orif ulnar / radial shaft fracture  1990    right-got infection-had total 10 surgeies 1990  . Orif ulnar fracture Left 05/23/2014    Procedure: OPEN REDUCTION INTERNAL FIXATION (ORIF) LEFT ULNAR FRACTURE;  Surgeon: Harvie Junior, MD;   Location: [facility];  Service: Orthopedics;  Laterality: Left;  . I&d extremity Right 05/07/2015    Procedure: IRRIGATION AND DEBRIDEMENT  ELBOW ;  Surgeon: Dominica Severin, MD;  Location: [facility] OR;  Service: Orthopedics;  Laterality: Right;   Social History:  reports that she has been smoking Cigarettes.  She has a 20 pack-year smoking history. She has never used smokeless tobacco. She reports that she drinks alcohol. She reports that she does not use illicit drugs.  No Known Allergies  Family History  Problem Relation Age of Onset  . Breast cancer Mother     Prior to Admission medications   Medication Sig Start Date End Date Taking? Authorizing Provider  albuterol-ipratropium (COMBIVENT) 18-103 MCG/ACT inhaler Inhale 2 puffs into the lungs every 6 (six) hours as needed for wheezing. 03/25/15   Shanker Levora Dredge, MD  budesonide-formoterol (SYMBICORT) 80-4.5 MCG/ACT inhaler Inhale 2 puffs into the lungs 2 (two) times daily. 03/25/15   Shanker Levora Dredge, MD  clindamycin (CLEOCIN) 150 MG capsule Take 3 capsules (450 mg total) by mouth 3 (three) times daily. 07/27/15   Charlestine Night, PA-C  DULoxetine (CYMBALTA) 30 MG capsule Take 60 mg by mouth daily.    Historical Provider, MD  fluticasone (FLONASE) 50 MCG/ACT nasal spray Place 1-2 sprays into both nostrils daily as needed  for allergies.    Historical Provider, MD  folic acid (FOLVITE) 1 MG tablet Take 1 tablet (1 mg total) by mouth daily. 05/11/15   Costin Otelia Sergeant, MD  ibuprofen (ADVIL,MOTRIN) 200 MG tablet Take 400-600 mg by mouth every 6 (six) hours as needed (knee pain).    Historical Provider, MD  MAGNESIUM PO Take 1 tablet by mouth daily.    Historical Provider, MD  Multiple Vitamin (MULTIVITAMIN WITH MINERALS) TABS tablet Take 1 tablet by mouth daily. 05/11/15   Costin Otelia Sergeant, MD  Potassium (POTASSIMIN PO) Take 1 tablet by mouth daily.    Historical Provider, MD  thiamine 100 MG tablet Take 1 tablet (100 mg total) by mouth  daily. 05/11/15   Costin Otelia Sergeant, MD  tiotropium (SPIRIVA) 18 MCG inhalation capsule Place 1 capsule (18 mcg total) into inhaler and inhale daily. 03/25/15   Shanker Levora Dredge, MD  vitamin B-12 (CYANOCOBALAMIN) 1000 MCG tablet Take 1 tablet (1,000 mcg total) by mouth daily. 05/11/15   Costin Otelia Sergeant, MD   Physical Exam: Filed Vitals:   08/03/15 2030 08/03/15 2100 08/03/15 2124 08/03/15 2200  BP: 100/55 116/76  143/73  Pulse: 98 95  87  Temp:      TempSrc:      Resp:    21  SpO2: 75% 90% 95% 100%    Wt Readings from Last 3 Encounters:  05/07/15 45.36 kg (100 lb)  03/19/15 48.7 kg (107 lb 5.8 oz)  03/15/15 45.36 kg (100 lb)    General:  Resting comfortably Eyes: PERRL, normal lids, irises & conjunctiva ENT: grossly normal hearing, lips & tongue Neck: no LAD, masses or thyromegaly Cardiovascular: RRR, no m/r/g. No LE edema. Telemetry: SR, no arrhythmias  Respiratory: Good air movement with wheezing bilaterally. Abdomen: soft, ntnd Skin: Multiple scabs throughout her skin Musculoskeletal: grossly normal tone BUE/BLE Psychiatric: grossly normal mood and affect, speech fluent and appropriate Neurologic: grossly non-focal.          Labs on Admission:  Basic Metabolic Panel:  Recent Labs Lab 08/03/15 1628  NA 137  K 3.1*  CL 99*  CO2 21*  GLUCOSE 207*  BUN 8  CREATININE 0.97  CALCIUM 7.8*   Liver Function Tests:  Recent Labs Lab 08/03/15 1730  AST 226*  ALT 67*  ALKPHOS 201*  BILITOT 0.5  PROT 6.3*  ALBUMIN 3.0*   No results for input(s): LIPASE, AMYLASE in the last 168 hours.  Recent Labs Lab 08/03/15 1733  AMMONIA 39*   CBC:  Recent Labs Lab 08/03/15 1628  WBC 7.3  HGB 9.7*  HCT 29.6*  MCV 101.4*  PLT 128*   Cardiac Enzymes: No results for input(s): CKTOTAL, CKMB, CKMBINDEX, TROPONINI in the last 168 hours.  BNP (last 3 results)  Recent Labs  03/18/15 2109 08/03/15 1820  BNP 234.0* 55.1    ProBNP (last 3 results) No results for  input(s): PROBNP in the last 8760 hours.  CBG: No results for input(s): GLUCAP in the last 168 hours.  Radiological Exams on Admission: Dg Abd Acute W/chest  08/03/2015   CLINICAL DATA:  Shortness of breath.  COPD.  Rectal prolapse.  EXAM: DG ABDOMEN ACUTE W/ 1V CHEST  COMPARISON:  One-view chest x-ray 03/18/2015.  FINDINGS: The heart is mildly enlarged. Mild interstitial coarsening is slightly increased.  Gas and stool are present throughout the colon. The bowel gas pattern is otherwise normal. There is no free air or obstruction. The axial skeleton is within normal limits.  IMPRESSION: 1. Mild increased interstitial coarsening could represent mild edema. 2. Negative two view abdomen.   Electronically Signed   By: Marin Roberts M.D.   On: 08/03/2015 17:39    EKG: Independently reviewed. Sinus rhythm with extrasystole nonspecific T-wave changes.  Assessment/Plan Acute respiratory failure with hypoxia due to COPD exacerbation: - She was started on IV Solu-Medrol, inhalers I will add antibiotics. - We'll admitted to step down and keep her on a Venturi mask and titrate to try to keep oxygen saturation greater than 90%. - She was hypoxic on admission setting 66% and ABG was done that shows partial pressure of oxygen 78  Alcohol dependence/ Alcohol withdrawal - Per ED records her CIWA   protocol was a changes she was started on Ativan IV. - Continue thiamine and folate IV.  Hyperglycemia - She is on no hypoglycemic agents at home and check a hemoglobin A1c will place patient nothing by mouth as she is inebriated. - Continue sliding scale insulin with CBG every 4 hours.  Hypokalemia: - Likely due to alcohol abuse, check a magnesium level and replete potassium IV  Protein-calorie malnutrition, severe - Consult nutrition once patient is awake, started on ensure 3 times a day as the patient is able to communicate.  Hyponatremia - Likely due to prerenal, started on IV fluids and recheck  a basic metabolic panel in the morning.  Elevated LFTs: Her LFTs as well as an 300, dyspnea 2-1. Likely due to alcohol abuse. Alkaline phosphatase is mildly elevated. Her bilirubin is within normal limits she has no abdominal pain on physical exam.  Macrocytic anemia: Likely due to alcohol abuse, when she is more awake and start her on folate and B-12.  Code Status: full DVT Prophylaxis:heparin Family Communication: none Disposition Plan: inpatient 3-4 days  Time spent: 65 min  Marinda Elk [facility] Pager [phone]

## 2015-08-03 NOTE — ED Provider Notes (Signed)
CSN: [id number]     Arrival date & time 08/03/15  1550 History   First MD Initiated Contact with Patient 08/03/15 1631     No chief complaint on file.     HPI  Patient presents for evaluation of multiple complaints. History of COPD and has been short of breath for the last several days. Also states that she was seen at her primary care physician [facility] cheddar rectal prolapse. She complained of a week of rectal pain. However she's not been constipated. As BM was yesterday. No blood. No vomiting.  History of alcohol isn't. Drinks daily. Cannot tell me her describe any syncopal which all symptoms that she's had. Again drinks daily.  Past Medical History  Diagnosis Date  . COPD (chronic obstructive pulmonary disease)   . Anxiety   . Shortness of breath   . Arthritis   . GERD (gastroesophageal reflux disease)   . Full dentures   . Insomnia   . Alcohol abuse   . Hepatitis C   . Alcohol abuse   . History of MRSA infection   . History of encephalopathy    Past Surgical History  Procedure Laterality Date  . Arm surgery    . Hand surgery  1990    both rt/lt carpal tunnels  . Shoulder surgery      right and left-age [age]  . Orif ulnar / radial shaft fracture  1990    right-got infection-had total 10 surgeies 1990  . Orif ulnar fracture Left 05/23/2014    Procedure: OPEN REDUCTION INTERNAL FIXATION (ORIF) LEFT ULNAR FRACTURE;  Surgeon: Harvie Junior, MD;  Location: [facility];  Service: Orthopedics;  Laterality: Left;  . I&d extremity Right 05/07/2015    Procedure: IRRIGATION AND DEBRIDEMENT  ELBOW ;  Surgeon: Dominica Severin, MD;  Location: [facility];  Service: Orthopedics;  Laterality: Right;   Family History  Problem Relation Age of Onset  . Breast cancer Mother    Social History  Substance Use Topics  . Smoking status: Current Every Day Smoker -- 1.00 packs/day for 20 years    Types: Cigarettes  . Smokeless tobacco: Never Used  . Alcohol Use: Yes     Comment: 1/5th  Vodka every day    OB History    No data available     Review of Systems  Constitutional: Negative for fever, chills, diaphoresis, appetite change and fatigue.  HENT: Negative for mouth sores, sore throat and trouble swallowing.   Eyes: Negative for visual disturbance.  Respiratory: Positive for shortness of breath and wheezing. Negative for cough and chest tightness.   Cardiovascular: Negative for chest pain.  Gastrointestinal: Negative for nausea, vomiting, abdominal pain, diarrhea and abdominal distention.       Intermittent rectal prolapse   Endocrine: Negative for polydipsia, polyphagia and polyuria.  Genitourinary: Negative for dysuria, frequency and hematuria.  Musculoskeletal: Negative for gait problem.  Skin: Negative for color change, pallor and rash.  Neurological: Positive for tremors. Negative for dizziness, syncope, light-headedness and headaches.  Hematological: Does not bruise/bleed easily.  Psychiatric/Behavioral: Negative for behavioral problems and confusion.      Allergies  Review of patient's allergies indicates no known allergies.  Home Medications   Prior to Admission medications   Medication Sig Start Date End Date Taking? Authorizing Provider  albuterol-ipratropium (COMBIVENT) 18-103 MCG/ACT inhaler Inhale 2 puffs into the lungs every 6 (six) hours as needed for wheezing. 03/25/15   Shanker Levora Dredge, MD  budesonide-formoterol (SYMBICORT) 80-4.5 MCG/ACT inhaler  Inhale 2 puffs into the lungs 2 (two) times daily. 03/25/15   Shanker Levora Dredge, MD  clindamycin (CLEOCIN) 150 MG capsule Take 3 capsules (450 mg total) by mouth 3 (three) times daily. 07/27/15   Charlestine Night, PA-C  DULoxetine (CYMBALTA) 30 MG capsule Take 60 mg by mouth daily.    Historical Provider, MD  fluticasone (FLONASE) 50 MCG/ACT nasal spray Place 1-2 sprays into both nostrils daily as needed for allergies.    Historical Provider, MD  folic acid (FOLVITE) 1 MG tablet Take 1 tablet (1 mg  total) by mouth daily. 05/11/15   Costin Otelia Sergeant, MD  ibuprofen (ADVIL,MOTRIN) 200 MG tablet Take 400-600 mg by mouth every 6 (six) hours as needed (knee pain).    Historical Provider, MD  MAGNESIUM PO Take 1 tablet by mouth daily.    Historical Provider, MD  Multiple Vitamin (MULTIVITAMIN WITH MINERALS) TABS tablet Take 1 tablet by mouth daily. 05/11/15   Costin Otelia Sergeant, MD  Potassium (POTASSIMIN PO) Take 1 tablet by mouth daily.    Historical Provider, MD  thiamine 100 MG tablet Take 1 tablet (100 mg total) by mouth daily. 05/11/15   Costin Otelia Sergeant, MD  tiotropium (SPIRIVA) 18 MCG inhalation capsule Place 1 capsule (18 mcg total) into inhaler and inhale daily. 03/25/15   Shanker Levora Dredge, MD  vitamin B-12 (CYANOCOBALAMIN) 1000 MCG tablet Take 1 tablet (1,000 mcg total) by mouth daily. 05/11/15   Costin Otelia Sergeant, MD   BP 149/80 mmHg  Pulse 34  Temp(Src) 98.3 F (36.8 C) (Oral)  Resp 21  SpO2 93% Physical Exam  Constitutional: She is oriented to person, place, and time. She appears well-developed and well-nourished. No distress.  HENT:  Head: Normocephalic.  Eyes: Conjunctivae are normal. Pupils are equal, round, and reactive to light. No scleral icterus.  Neck: Normal range of motion. Neck supple. No thyromegaly present.  Cardiovascular: Normal rate and regular rhythm.  Exam reveals no gallop and no friction rub.   No murmur heard. Pulmonary/Chest: Accessory muscle usage present. Tachypnea noted. No respiratory distress. She has decreased breath sounds. She has wheezes. She has no rales.  Abdominal: Soft. Bowel sounds are normal. She exhibits no distension. There is no tenderness. There is no rebound.  Genitourinary:     Musculoskeletal: Normal range of motion.  Neurological: She is alert and oriented to person, place, and time.  Skin: Skin is warm and dry. No rash noted.  Psychiatric: She has a normal mood and affect. Her behavior is normal.    ED Course  Procedures (including  critical care time) Labs Review Labs Reviewed  BASIC METABOLIC PANEL - Abnormal; Notable for the following:    Potassium 3.1 (*)    Chloride 99 (*)    CO2 21 (*)    Glucose, Bld 207 (*)    Calcium 7.8 (*)    Anion gap 17 (*)    All other components within normal limits  CBC - Abnormal; Notable for the following:    RBC 2.92 (*)    Hemoglobin 9.7 (*)    HCT 29.6 (*)    MCV 101.4 (*)    RDW 19.8 (*)    Platelets 128 (*)    All other components within normal limits  AMMONIA - Abnormal; Notable for the following:    Ammonia 39 (*)    All other components within normal limits  ETHANOL - Abnormal; Notable for the following:    Alcohol, Ethyl (B) 436 (*)  All other components within normal limits  HEPATIC FUNCTION PANEL - Abnormal; Notable for the following:    Total Protein 6.3 (*)    Albumin 3.0 (*)    AST 226 (*)    ALT 67 (*)    Alkaline Phosphatase 201 (*)    Indirect Bilirubin 0.2 (*)    All other components within normal limits  I-STAT ARTERIAL BLOOD GAS, ED - Abnormal; Notable for the following:    pH, Arterial 7.319 (*)    pCO2 arterial 52.8 (*)    pO2, Arterial 78.0 (*)    Bicarbonate 27.2 (*)    All other components within normal limits  CBG MONITORING, ED - Abnormal; Notable for the following:    Glucose-Capillary 185 (*)    All other components within normal limits  BRAIN NATRIURETIC PEPTIDE  URINE RAPID DRUG SCREEN, HOSP PERFORMED  BLOOD GAS, ARTERIAL    Imaging Review Dg Abd Acute W/chest  08/03/2015   CLINICAL DATA:  Shortness of breath.  COPD.  Rectal prolapse.  EXAM: DG ABDOMEN ACUTE W/ 1V CHEST  COMPARISON:  One-view chest x-ray 03/18/2015.  FINDINGS: The heart is mildly enlarged. Mild interstitial coarsening is slightly increased.  Gas and stool are present throughout the colon. The bowel gas pattern is otherwise normal. There is no free air or obstruction. The axial skeleton is within normal limits.  IMPRESSION: 1. Mild increased interstitial  coarsening could represent mild edema. 2. Negative two view abdomen.   Electronically Signed   By: Marin Roberts M.D.   On: 08/03/2015 17:39   I, Fayrene Fearing, Berniece Abid JOSEPH, personally reviewed and evaluated these images and lab results as part of my medical decision-making.   EKG Interpretation None      MDM   Final diagnoses:  COPD exacerbation  Alcohol withdrawal, with delirium  Rectal prolapse    Patient presents rather dramatically. He is noticeably short of breath and tachypneic. Complains of rectal pain. Initially has normal exam and rectal exam. However does intermittent rectal prolapse and reduces spontaneously. Admits to chronic cough from her COPD. Diminished with wheezing here. Given hour long neb. Given IV Solu-Medrol. Chest x-ray shows no obvious infiltrate.  Per discussion with on-call surgeon only needs referral for outpatient discussion with colorectal surgeon. Remains 82% on room air here. Has become somewhat more agitated and showing signs of tremor and withdrawal here.  I feel she needs admission for additional nebulized albuterol, sterilely's, O2, CIWA protocol.    Rolland Porter, MD 08/04/15 [phone]

## 2015-08-04 ENCOUNTER — Encounter (HOSPITAL_COMMUNITY): Payer: Self-pay | Admitting: *Deleted

## 2015-08-04 DIAGNOSIS — E43 Unspecified severe protein-calorie malnutrition: Secondary | ICD-10-CM | POA: Diagnosis present

## 2015-08-04 DIAGNOSIS — F10221 Alcohol dependence with intoxication delirium: Secondary | ICD-10-CM

## 2015-08-04 DIAGNOSIS — D539 Nutritional anemia, unspecified: Secondary | ICD-10-CM

## 2015-08-04 DIAGNOSIS — Z720 Tobacco use: Secondary | ICD-10-CM

## 2015-08-04 LAB — COMPREHENSIVE METABOLIC PANEL
ALK PHOS: 174 U/L — AB (ref 38–126)
ALT: 58 U/L — AB (ref 14–54)
ANION GAP: 10 (ref 5–15)
AST: 147 U/L — AB (ref 15–41)
Albumin: 2.7 g/dL — ABNORMAL LOW (ref 3.5–5.0)
BILIRUBIN TOTAL: 0.5 mg/dL (ref 0.3–1.2)
BUN: 10 mg/dL (ref 6–20)
CALCIUM: 7.6 mg/dL — AB (ref 8.9–10.3)
CHLORIDE: 104 mmol/L (ref 101–111)
CO2: 27 mmol/L (ref 22–32)
Creatinine, Ser: 0.84 mg/dL (ref 0.44–1.00)
Glucose, Bld: 84 mg/dL (ref 65–99)
Potassium: 4.8 mmol/L (ref 3.5–5.1)
SODIUM: 141 mmol/L (ref 135–145)
TOTAL PROTEIN: 6.3 g/dL — AB (ref 6.5–8.1)

## 2015-08-04 LAB — CBC
HCT: 28.4 % — ABNORMAL LOW (ref 36.0–46.0)
HCT: 26.2 % — ABNORMAL LOW (ref 36.0–46.0)
HEMATOCRIT: 29.2 % — AB (ref 36.0–46.0)
HEMOGLOBIN: 9.2 g/dL — AB (ref 12.0–15.0)
Hemoglobin: 9.4 g/dL — ABNORMAL LOW (ref 12.0–15.0)
Hemoglobin: 8.4 g/dL — ABNORMAL LOW (ref 12.0–15.0)
MCH: 32.9 pg (ref 26.0–34.0)
MCH: 33.1 pg (ref 26.0–34.0)
MCH: 32.4 pg (ref 26.0–34.0)
MCHC: 32.2 g/dL (ref 30.0–36.0)
MCHC: 32.4 g/dL (ref 30.0–36.0)
MCHC: 32.1 g/dL (ref 30.0–36.0)
MCV: 102.1 fL — AB (ref 78.0–100.0)
MCV: 102.2 fL — ABNORMAL HIGH (ref 78.0–100.0)
MCV: 101.2 fL — ABNORMAL HIGH (ref 78.0–100.0)
PLATELETS: 125 10*3/uL — AB (ref 150–400)
PLATELETS: 133 10*3/uL — AB (ref 150–400)
Platelets: 121 10*3/uL — ABNORMAL LOW (ref 150–400)
RBC: 2.78 MIL/uL — AB (ref 3.87–5.11)
RBC: 2.86 MIL/uL — AB (ref 3.87–5.11)
RBC: 2.59 MIL/uL — ABNORMAL LOW (ref 3.87–5.11)
RDW: 20.2 % — ABNORMAL HIGH (ref 11.5–15.5)
RDW: 20.3 % — AB (ref 11.5–15.5)
RDW: 20.2 % — ABNORMAL HIGH (ref 11.5–15.5)
WBC: 5.6 10*3/uL (ref 4.0–10.5)
WBC: 5.7 10*3/uL (ref 4.0–10.5)
WBC: 5 10*3/uL (ref 4.0–10.5)

## 2015-08-04 LAB — GLUCOSE, CAPILLARY
GLUCOSE-CAPILLARY: 214 mg/dL — AB (ref 65–99)
GLUCOSE-CAPILLARY: 86 mg/dL (ref 65–99)
GLUCOSE-CAPILLARY: 97 mg/dL (ref 65–99)
Glucose-Capillary: 156 mg/dL — ABNORMAL HIGH (ref 65–99)
Glucose-Capillary: 98 mg/dL (ref 65–99)

## 2015-08-04 LAB — BASIC METABOLIC PANEL
ANION GAP: 16 — AB (ref 5–15)
BUN: 6 mg/dL (ref 6–20)
CALCIUM: 7.3 mg/dL — AB (ref 8.9–10.3)
CHLORIDE: 105 mmol/L (ref 101–111)
CO2: 20 mmol/L — ABNORMAL LOW (ref 22–32)
CREATININE: 0.77 mg/dL (ref 0.44–1.00)
GFR calc non Af Amer: >60 mL/min (ref >60)
GLUCOSE: 174 mg/dL — AB (ref 65–99)
Potassium: 3.9 mmol/L (ref 3.5–5.1)
Sodium: 141 mmol/L (ref 135–145)

## 2015-08-04 LAB — RAPID HIV SCREEN (HIV 1/2 AB+AG)
HIV 1/2 ANTIBODIES: NONREACTIVE
HIV-1 P24 ANTIGEN - HIV24: NONREACTIVE

## 2015-08-04 LAB — IRON AND TIBC
IRON: 48 ug/dL (ref 28–170)
SATURATION RATIOS: 14 % (ref 10.4–31.8)
TIBC: 344 ug/dL (ref 250–450)
UIBC: 296 ug/dL

## 2015-08-04 LAB — CBG MONITORING, ED: Glucose-Capillary: 185 mg/dL — ABNORMAL HIGH (ref 65–99)

## 2015-08-04 LAB — RETICULOCYTES
RBC.: 2.56 MIL/uL — ABNORMAL LOW (ref 3.87–5.11)
RETIC COUNT ABSOLUTE: 51.2 10*3/uL (ref 19.0–186.0)
RETIC CT PCT: 2 % (ref 0.4–3.1)

## 2015-08-04 LAB — FERRITIN: FERRITIN: 117 ng/mL (ref 11–307)

## 2015-08-04 LAB — CREATININE, SERUM
Creatinine, Ser: 0.77 mg/dL (ref 0.44–1.00)
GFR calc non Af Amer: >60 mL/min (ref >60)

## 2015-08-04 LAB — MAGNESIUM
MAGNESIUM: 2.2 mg/dL (ref 1.7–2.4)
Magnesium: 1.8 mg/dL (ref 1.7–2.4)

## 2015-08-04 LAB — VITAMIN B12: VITAMIN B 12: 738 pg/mL (ref 180–914)

## 2015-08-04 LAB — MRSA PCR SCREENING: MRSA by PCR: NEGATIVE

## 2015-08-04 LAB — FOLATE: Folate: 67.2 ng/mL (ref >5.9)

## 2015-08-04 MED ORDER — FOLIC ACID 1 MG PO TABS
1.0000 mg | ORAL_TABLET | Freq: Every day | ORAL | Status: DC
  Filled 2015-08-04: qty 1

## 2015-08-04 MED ORDER — LORAZEPAM 1 MG PO TABS: 1.0000 mg | ORAL_TABLET | Freq: Four times a day (QID) | ORAL | Status: DC | PRN

## 2015-08-04 MED ORDER — POTASSIUM CHLORIDE 10 MEQ/100ML IV SOLN
10.0000 meq | INTRAVENOUS | Status: AC
  Administered 2015-08-04 (×3): 10 meq via INTRAVENOUS
  Filled 2015-08-04 (×3): qty 100

## 2015-08-04 MED ORDER — POTASSIUM CHLORIDE 10 MEQ/100ML IV SOLN: 10.0000 meq | INTRAVENOUS | Status: DC

## 2015-08-04 MED ORDER — FOLIC ACID 5 MG/ML IJ SOLN
1.0000 mg | Freq: Every day | INTRAMUSCULAR | Status: DC
  Filled 2015-08-04: qty 0.2

## 2015-08-04 MED ORDER — LORAZEPAM 2 MG/ML IJ SOLN
2.0000 mg | INTRAMUSCULAR | Status: DC | PRN
  Administered 2015-08-04 – 2015-08-05 (×12): 2 mg via INTRAVENOUS
  Administered 2015-08-05: 3 mg via INTRAVENOUS
  Administered 2015-08-05 – 2015-08-07 (×11): 2 mg via INTRAVENOUS
  Administered 2015-08-07: 3 mg via INTRAVENOUS
  Administered 2015-08-07 (×3): 2 mg via INTRAVENOUS
  Administered 2015-08-07 – 2015-08-08 (×10): 3 mg via INTRAVENOUS
  Filled 2015-08-04: qty 1
  Filled 2015-08-04: qty 2
  Filled 2015-08-04: qty 1
  Filled 2015-08-04: qty 2
  Filled 2015-08-04: qty 1
  Filled 2015-08-04: qty 2
  Filled 2015-08-04 (×4): qty 1
  Filled 2015-08-04: qty 2
  Filled 2015-08-04 (×10): qty 1
  Filled 2015-08-04: qty 2
  Filled 2015-08-04 (×5): qty 1
  Filled 2015-08-04 (×2): qty 2
  Filled 2015-08-04 (×2): qty 1
  Filled 2015-08-04: qty 2
  Filled 2015-08-04: qty 1
  Filled 2015-08-04: qty 2
  Filled 2015-08-04: qty 1
  Filled 2015-08-04: qty 2
  Filled 2015-08-04 (×2): qty 1
  Filled 2015-08-04 (×3): qty 2

## 2015-08-04 MED ORDER — DULOXETINE HCL 60 MG PO CPEP
60.0000 mg | ORAL_CAPSULE | Freq: Every day | ORAL | Status: DC
  Administered 2015-08-04 – 2015-08-28 (×22): 60 mg via ORAL
  Filled 2015-08-04 (×26): qty 1

## 2015-08-04 MED ORDER — CYANOCOBALAMIN 1000 MCG/ML IJ SOLN
1000.0000 ug | Freq: Once | INTRAMUSCULAR | Status: AC
  Administered 2015-08-04: 1000 ug via INTRAMUSCULAR
  Filled 2015-08-04: qty 1

## 2015-08-04 MED ORDER — BUDESONIDE-FORMOTEROL FUMARATE 80-4.5 MCG/ACT IN AERO
2.0000 | INHALATION_SPRAY | Freq: Two times a day (BID) | RESPIRATORY_TRACT | Status: DC
  Administered 2015-08-04 – 2015-08-28 (×40): 2 via RESPIRATORY_TRACT
  Filled 2015-08-04 (×2): qty 6.9

## 2015-08-04 MED ORDER — MAGNESIUM 200 MG PO TABS: ORAL_TABLET | Freq: Every day | ORAL | Status: DC

## 2015-08-04 MED ORDER — ADULT MULTIVITAMIN W/MINERALS CH
1.0000 | ORAL_TABLET | Freq: Every day | ORAL | Status: DC
  Administered 2015-08-04: 1 via ORAL
  Filled 2015-08-04 (×2): qty 1

## 2015-08-04 MED ORDER — INSULIN ASPART 100 UNIT/ML ~~LOC~~ SOLN
0.0000 [IU] | SUBCUTANEOUS | Status: DC
  Administered 2015-08-04: 2 [IU] via SUBCUTANEOUS
  Administered 2015-08-04: 3 [IU] via SUBCUTANEOUS

## 2015-08-04 MED ORDER — LORAZEPAM 1 MG PO TABS: 0.0000 mg | ORAL_TABLET | Freq: Two times a day (BID) | ORAL | Status: DC

## 2015-08-04 MED ORDER — TIOTROPIUM BROMIDE MONOHYDRATE 18 MCG IN CAPS
18.0000 ug | ORAL_CAPSULE | Freq: Every day | RESPIRATORY_TRACT | Status: DC
  Administered 2015-08-05 – 2015-08-28 (×16): 18 ug via RESPIRATORY_TRACT
  Filled 2015-08-04 (×4): qty 5

## 2015-08-04 MED ORDER — VITAMIN B-1 100 MG PO TABS
100.0000 mg | ORAL_TABLET | Freq: Every day | ORAL | Status: DC
  Administered 2015-08-04: 100 mg via ORAL
  Filled 2015-08-04 (×2): qty 1

## 2015-08-04 MED ORDER — MAGNESIUM OXIDE 400 (241.3 MG) MG PO TABS
400.0000 mg | ORAL_TABLET | Freq: Every day | ORAL | Status: DC
  Administered 2015-08-04: 400 mg via ORAL
  Filled 2015-08-04 (×2): qty 1

## 2015-08-04 MED ORDER — FOLIC ACID 1 MG PO TABS
1.0000 mg | ORAL_TABLET | Freq: Every day | ORAL | Status: DC
  Administered 2015-08-04: 1 mg via ORAL
  Filled 2015-08-04: qty 1

## 2015-08-04 MED ORDER — THIAMINE HCL 100 MG/ML IJ SOLN
100.0000 mg | Freq: Every day | INTRAMUSCULAR | Status: DC
  Administered 2015-08-05 – 2015-08-07 (×3): 100 mg via INTRAVENOUS
  Filled 2015-08-04 (×4): qty 1

## 2015-08-04 MED ORDER — FOLIC ACID 5 MG/ML IJ SOLN
1.0000 mg | Freq: Every day | INTRAMUSCULAR | Status: DC
  Administered 2015-08-04 – 2015-08-07 (×4): 1 mg via INTRAVENOUS
  Filled 2015-08-04 (×4): qty 0.2

## 2015-08-04 MED ORDER — DEXTROSE 5 % IV SOLN
100.0000 mg | Freq: Two times a day (BID) | INTRAVENOUS | Status: DC
  Administered 2015-08-04 – 2015-08-08 (×9): 100 mg via INTRAVENOUS
  Filled 2015-08-04 (×10): qty 100

## 2015-08-04 MED ORDER — LORAZEPAM 2 MG/ML IJ SOLN
0.0000 mg | Freq: Four times a day (QID) | INTRAMUSCULAR | Status: DC
  Administered 2015-08-04: 2 mg via INTRAVENOUS
  Filled 2015-08-04: qty 1

## 2015-08-04 MED ORDER — LORAZEPAM 2 MG/ML IJ SOLN: 0.0000 mg | Freq: Two times a day (BID) | INTRAMUSCULAR | Status: DC

## 2015-08-04 MED ORDER — SODIUM CHLORIDE 0.9 % IJ SOLN
3.0000 mL | Freq: Two times a day (BID) | INTRAMUSCULAR | Status: DC
  Administered 2015-08-04 – 2015-08-26 (×29): 3 mL via INTRAVENOUS

## 2015-08-04 MED ORDER — LORAZEPAM 2 MG/ML IJ SOLN
0.0000 mg | Freq: Two times a day (BID) | INTRAMUSCULAR | Status: DC
Start: 2015-08-06 — End: 2015-08-04

## 2015-08-04 MED ORDER — LORAZEPAM 1 MG PO TABS: 0.0000 mg | ORAL_TABLET | Freq: Four times a day (QID) | ORAL | Status: DC

## 2015-08-04 MED ORDER — HEPARIN SODIUM (PORCINE) 5000 UNIT/ML IJ SOLN
5000.0000 [IU] | Freq: Three times a day (TID) | INTRAMUSCULAR | Status: DC
  Administered 2015-08-04 – 2015-08-28 (×61): 5000 [IU] via SUBCUTANEOUS
  Filled 2015-08-04 (×64): qty 1

## 2015-08-04 MED ORDER — LORAZEPAM 2 MG/ML IJ SOLN: 0.0000 mg | Freq: Four times a day (QID) | INTRAMUSCULAR | Status: DC

## 2015-08-04 MED ORDER — POTASSIUM CHLORIDE 2 MEQ/ML IV SOLN
INTRAVENOUS | Status: DC
  Administered 2015-08-04: 03:00:00 via INTRAVENOUS
  Filled 2015-08-04 (×5): qty 1000

## 2015-08-04 MED ORDER — LORAZEPAM 2 MG/ML IJ SOLN: 1.0000 mg | Freq: Four times a day (QID) | INTRAMUSCULAR | Status: DC | PRN

## 2015-08-04 MED ORDER — NICOTINE 21 MG/24HR TD PT24
21.0000 mg | MEDICATED_PATCH | Freq: Every day | TRANSDERMAL | Status: DC
  Administered 2015-08-04 – 2015-08-28 (×24): 21 mg via TRANSDERMAL
  Filled 2015-08-04 (×27): qty 1

## 2015-08-04 NOTE — Progress Notes (Signed)
UR COMPLETED  

## 2015-08-04 NOTE — Progress Notes (Signed)
[place] TEAM 1 - Stepdown/ICU TEAM Progress Note  Rhonda Mitchell WUJ:[id number] DOB: 04/14/1961 DOA: 08/03/2015 PCP: [name], MD  Admit HPI / Brief Narrative: 54 y.o. WF PMHx alcohol abuse,tobacco abuse and COPD non-oxygen dependent, hepatitis C.  Comes in for shortness of breath that started several days prior to admission. The patient is sleepy and cannot provide a history so most of the history is obtained from the chart. As per chart the patient had been short of breath last several days. She denies any cough or fever.  In the ED: She was found to be hypoxic and was placed on a nonrebreather and her oxygen saturations came up, she was given an hour-long treatment of albuterol but she was still desatted into the 70s. Chest x-ray was done that shows no infiltrates, her LFTs are mildly elevated, she was also found with an alcohol level of 436 she was started on Ativan protocol and she is only responsive to pain.   HPI/Subjective: 8/14 A/O 4, patient cachectic, tremors, appears much older than stated age. States has been smoking 1/2 PPD 40+ years  Assessment/Plan: Acute respiratory failure with hypoxia due to COPD exacerbation: - She was started on IV Solu-Medrol, inhalers I will add antibiotics. - Will admitted to step down and keep her on a Venturi mask and titrate to try to keep oxygen saturation greater than 90%. - She was hypoxic on admission setting 66% and ABG was done that shows partial pressure of oxygen 78  Alcohol dependence/ Alcohol withdrawal -On admission alcohol 436 -UDS pending - Per ED records her CIWA protocol was a changes she was started on Ativan IV. - Continue thiamine and folate IV. -Bedside sitter -Restraints when appropriate  Tobacco abuse -Nicoderm patch  Hyperglycemia - Hemoglobin A1c pending  -Sensitive SSI   Hypokalemia: - Likely due to alcohol abuse, check a magnesium level and replete potassium IV  Protein-calorie malnutrition,  severe - Consult nutrition once patient is awake, started on ensure 3 times a day as the patient is able to communicate.  Hyponatremia - Likely due to prerenal, started on IV fluids and recheck a basic metabolic panel in the morning.  Elevated LFTs: Her LFTs as well as an 300, dyspnea 2-1. Likely due to alcohol abuse. Alkaline phosphatase is mildly elevated. Her bilirubin is within normal limits she has no abdominal pain on physical exam.  Macrocytic anemia: -Anemia panel  -After anemia panel drawn folate IV, B-12 IV   Infectious -HIV pending -RPR pending -Acute hepatitis panel pending     Code Status: FULL Family Communication: no family present at time of exam Disposition Plan: [facility] alcohol detoxification    Consultants:   Procedure/Significant Events:    Culture   Antibiotics:   DVT prophylaxis:    Devices    LINES / TUBES:      Continuous Infusions: . dextrose 5 % and 0.45% NaCl 1,000 mL with potassium chloride 40 mEq infusion 75 mL/hr at 08/04/15 0244    Objective: VITAL SIGNS: Temp: 97.9 F (36.6 C) (08/14 1144) Temp Source: Oral (08/14 1144) BP: 143/79 mmHg (08/14 1144) Pulse Rate: 86 (08/14 1144) SPO2; FIO2:   Intake/Output Summary (Last 24 hours) at 08/04/15 1403 Last data filed at 08/04/15 1200  Gross per 24 hour  Intake    450 ml  Output    300 ml  Net    150 ml     Exam: General: A/O 4, patient cachectic, tremors, appears much older than stated age, No acute respiratory  distress Eyes: Negative headache, eye pain, scleral hemorrhage ENT: Negative Runny nose, negative ear pain, negative gingival bleeding, Neck:  Negative scars, masses, torticollis, lymphadenopathy, JVD Lungs: Clear to auscultation bilaterally without wheezes or crackles Cardiovascular: Tachycardic, Regular rhythm without murmur gallop or rub normal S1 and S2 Abdomen:negative abdominal pain, negative dysphagia, nondistended, positive soft, bowel  sounds, no rebound, no ascites, no appreciable mass Extremities: No significant cyanosis, clubbing, or edema bilateral lower extremities Psychiatric:  Unable to assess  Neurologic:  Unable to fully assess, patient with general body tremors, patient does follow commands though becomes confused easily and requires redirection    Data Reviewed: Basic Metabolic Panel:  Recent Labs Lab 08/03/15 1628 08/04/15 0240 08/04/15 1107  NA 137 141 141  K 3.1* 3.9 4.8  CL 99* 105 104  CO2 21* 20* 27  GLUCOSE 207* 174* 84  BUN CREATININE 0.97 0.77  0.77 0.84  CALCIUM 7.8* 7.3* 7.6*  MG  --  2.2 1.8   Liver Function Tests:  Recent Labs Lab 08/03/15 1730 08/04/15 1107  AST 226* 147*  ALT 67* 58*  ALKPHOS 201* 174*  BILITOT 0.5 0.5  PROT 6.3* 6.3*  ALBUMIN 3.0* 2.7*   No results for input(s): LIPASE, AMYLASE in the last 168 hours.  Recent Labs Lab 08/03/15 1733  AMMONIA 39*   CBC:  Recent Labs Lab 08/03/15 1628 08/04/15 0240 08/04/15 1107  WBC 7.3 5.6  5.7 5.0  HGB 9.7* 9.2*  9.4* 8.4*  HCT 29.6* 28.4*  29.2* 26.2*  MCV 101.4* 102.2*  102.1* 101.2*  PLT 128* 133*  125* 121*   Cardiac Enzymes: No results for input(s): CKTOTAL, CKMB, CKMBINDEX, TROPONINI in the last 168 hours. BNP (last 3 results)  Recent Labs  03/18/15 2109 08/03/15 1820  BNP 234.0* 55.1    ProBNP (last 3 results) No results for input(s): PROBNP in the last 8760 hours.  CBG:  Recent Labs Lab 08/04/15 0016 08/04/15 0347 08/04/15 0750 08/04/15 1147  GLUCAP 185* 214* 156* 86    Recent Results (from the past 240 hour(s))  MRSA PCR Screening     Status: None   Collection Time: 08/04/15  2:00 AM  Result Value Ref Range Status   MRSA by PCR NEGATIVE NEGATIVE Final    Comment:        The GeneXpert MRSA Assay (FDA approved for NASAL specimens only), is one component of a comprehensive MRSA colonization surveillance program. It is not intended to diagnose MRSA infection  nor to guide or monitor treatment for MRSA infections.      Studies:  Recent x-ray studies have been reviewed in detail by the Attending Physician  Scheduled Meds:  Scheduled Meds: . budesonide-formoterol  2 puff Inhalation BID  . doxycycline (VIBRAMYCIN) IV  100 mg Intravenous Q12H  . DULoxetine  60 mg Oral Daily  . folic acid  1 mg Oral Daily  . heparin  5,000 Units Subcutaneous 3 times per day  . insulin aspart  0-9 Units Subcutaneous 6 times per day  . magnesium oxide  400 mg Oral Daily  . multivitamin with minerals  1 tablet Oral Daily  . sodium chloride  3 mL Intravenous Q12H  . thiamine  100 mg Oral Daily   Or  . thiamine  100 mg Intravenous Daily  . tiotropium  18 mcg Inhalation Daily    Time spent on care of this patient: 40 mins   Rhonda Mitchell, Rhonda Mitchell , MD  [facility] Office  [phone] Pager - [phone]  On-Call/Text Page:      [email or web address]      password [email or web address]  If 7PM-7AM, please contact night-coverage [email or web address] Password [email or web address] 08/04/2015, 2:03 PM   LOS: 1 day   Care during the described time interval was provided by me .  I have reviewed this patient's available data, including medical history, events of note, physical examination, and all test results as part of my evaluation. I have personally reviewed and interpreted all radiology studies.   Rhonda Littles, MD [phone] Pager

## 2015-08-04 NOTE — ED Notes (Signed)
Dr. Robb Matar paged about request from stepdown to change ciwa protocol to stepdown orders, Dr. Robb Matar called back states he is unable to change order due to pt been oversedated. Dr. Robb Matar to call the unit.

## 2015-08-05 LAB — GLUCOSE, CAPILLARY
GLUCOSE-CAPILLARY: 106 mg/dL — AB (ref 65–99)
GLUCOSE-CAPILLARY: 112 mg/dL — AB (ref 65–99)
GLUCOSE-CAPILLARY: 110 mg/dL — AB (ref 65–99)
Glucose-Capillary: 113 mg/dL — ABNORMAL HIGH (ref 65–99)
Glucose-Capillary: 104 mg/dL — ABNORMAL HIGH (ref 65–99)
Glucose-Capillary: 129 mg/dL — ABNORMAL HIGH (ref 65–99)

## 2015-08-05 LAB — RAPID URINE DRUG SCREEN, HOSP PERFORMED
AMPHETAMINES: NOT DETECTED
BARBITURATES: NOT DETECTED
BENZODIAZEPINES: POSITIVE — AB
COCAINE: NOT DETECTED
Opiates: POSITIVE — AB
Tetrahydrocannabinol: NOT DETECTED

## 2015-08-05 LAB — RPR: RPR: NONREACTIVE

## 2015-08-05 LAB — HEPATITIS PANEL, ACUTE
HCV Ab: >11 s/co ratio — ABNORMAL HIGH (ref 0.0–0.9)
HEP B C IGM: NEGATIVE
HEP B S AG: NEGATIVE
Hep A IgM: NEGATIVE

## 2015-08-05 LAB — HEMOGLOBIN A1C
HEMOGLOBIN A1C: 5.6 % (ref 4.8–5.6)
Mean Plasma Glucose: 114 mg/dL

## 2015-08-05 MED ORDER — MORPHINE SULFATE (PF) 4 MG/ML IV SOLN
4.0000 mg | Freq: Once | INTRAVENOUS | Status: AC
  Administered 2015-08-05: 4 mg via INTRAVENOUS
  Filled 2015-08-05: qty 1

## 2015-08-05 MED ORDER — KETOROLAC TROMETHAMINE 30 MG/ML IJ SOLN
30.0000 mg | Freq: Once | INTRAMUSCULAR | Status: AC
  Administered 2015-08-05: 30 mg via INTRAVENOUS
  Filled 2015-08-05: qty 1

## 2015-08-05 MED ORDER — CHLORHEXIDINE GLUCONATE 0.12 % MT SOLN
15.0000 mL | Freq: Two times a day (BID) | OROMUCOSAL | Status: DC
Start: 2015-08-05 — End: 2015-08-14
  Administered 2015-08-05 – 2015-08-14 (×15): 15 mL via OROMUCOSAL
  Filled 2015-08-05 (×15): qty 15

## 2015-08-05 MED ORDER — CETYLPYRIDINIUM CHLORIDE 0.05 % MT LIQD
7.0000 mL | Freq: Two times a day (BID) | OROMUCOSAL | Status: DC
  Administered 2015-08-08 – 2015-08-14 (×10): 7 mL via OROMUCOSAL

## 2015-08-05 NOTE — Care Management Note (Addendum)
Case Management Note  Patient Details  Name: Mavery Milling MRN: [id number] Date of Birth: [birth year]  Subjective/Objective:              Admitted with acute hypoxic respiratory failure due to COPD exacerbation and extreme EtOH intoxication. Hx of COPD, hepatitis, alcohol and tobacco abuse. Non oxygen dependent prior to admission.   Action/Plan: Discharge planning  Expected Discharge Date:                  Expected Discharge Plan:  Home/Self Care  In-House Referral:     Discharge planning Services  CM Consult  Post Acute Care Choice:    Choice offered to:     DME Arranged:    DME Agency:     HH Arranged:    HH Agency:     Status of Service:  In process, will continue to follow  Medicare Important Message Given:    Date Medicare IM Given:    Medicare IM give by:    Date Additional Medicare IM Given:    Additional Medicare Important Message give by:     If discussed at Long Length of Stay Meetings, dates discussed:    Additional Comments: Lavena Bullion [facility]) [phone] ,[address] ([place]) [phone]   Gae [place], [place] [phone] 08/05/2015, 9:09 PM

## 2015-08-05 NOTE — Progress Notes (Signed)
[place] TEAM 1 - Stepdown/ICU TEAM Progress Note  Rhonda Mitchell OZH:[id number] DOB: Dec 12, 1961 DOA: 08/03/2015 PCP: [name], MD  Admit HPI / Brief Narrative: 54 y.o. F Hx alcohol abuse, tobacco abuse, COPD (non-oxygen dependent), and hepatitis who presented w/ shortness of breath that started several days prior to admission.   In the ED she was found to be hypoxic and was placed on a nonrebreather. Chest x-ray showed no infiltrates, her LFTs were mildly elevated, and she was found with an alcohol level of 436.    HPI/Subjective: The patient is sedate at the time of my evaluation.  She will answer some questions with single words but then rapidly goes back to sleep.  She cannot provide a reliable history at this time.  Assessment/Plan:  Acute hypoxic respiratory failure due to COPD exacerbation and extreme EtOH intoxication Resolved - oxygen saturations approaching 100% on room air - most likely contributor severe alcohol intoxication with respiratory depression  Alcohol dependence / Alcohol withdrawal -On admission alcohol level 436 -UDS otherwise negative -Continue thiamine and folate IV -Continue CIWA  Tobacco abuse -Nicoderm patch  Hyperglycemia -A1c 5.6  Hypokalemia -Likely due to alcohol abuse - resolved w/ supplementation - Mg normal   Protein-calorie malnutrition, severe -Consult nutrition once patient is awake  Hyponatremia -corrected w/ volume expansion  Elevated LFTs -Likely due to alcohol abuse - improving   Hepatitis C + -Will check quant viral load   Macrocytic anemia -Anemia panel unrevealing    Code Status: FULL Family Communication: no family present at time of exam Disposition Plan: SDU  Consultants: None  Procedures: None  Antibiotics: None  DVT prophylaxis: SQ heparin   Objective: Blood pressure 148/92, pulse 68, temperature 98.2 F (36.8 C), temperature source Oral, resp. rate 25, height 5' (1.524 m), weight 61.3 kg (135  lb 2.3 oz), SpO2 98 %.  Intake/Output Summary (Last 24 hours) at 08/05/15 1622 Last data filed at 08/05/15 0217  Gross per 24 hour  Intake 2018.75 ml  Output     50 ml  Net 1968.75 ml   Exam: General: No acute respiratory distress Lungs: Clear to auscultation bilaterally without wheezes or crackles Cardiovascular: Regular rate and rhythm without murmur gallop or rub normal S1 and S2 Abdomen: Nontender, nondistended, soft, bowel sounds positive, no rebound, no ascites, no appreciable mass Extremities: No significant cyanosis, clubbing, or edema bilateral lower extremities  Data Reviewed: Basic Metabolic Panel:  Recent Labs Lab 08/03/15 1628 08/04/15 0240 08/04/15 1107  NA 137 141 141  K 3.1* 3.9 4.8  CL 99* 105 104  CO2 21* 20* 27  GLUCOSE 207* 174* 84  BUN 8 6 10   CREATININE 0.97 0.77  0.77 0.84  CALCIUM 7.8* 7.3* 7.6*  MG  --  2.2 1.8   Liver Function Tests:  Recent Labs Lab 08/03/15 1730 08/04/15 1107  AST 226* 147*  ALT 67* 58*  ALKPHOS 201* 174*  BILITOT 0.5 0.5  PROT 6.3* 6.3*  ALBUMIN 3.0* 2.7*    Recent Labs Lab 08/03/15 1733  AMMONIA 39*   CBC:  Recent Labs Lab 08/03/15 1628 08/04/15 0240 08/04/15 1107  WBC 7.3 5.6  5.7 5.0  HGB 9.7* 9.2*  9.4* 8.4*  HCT 29.6* 28.4*  29.2* 26.2*  MCV 101.4* 102.2*  102.1* 101.2*  PLT 128* 133*  125* 121*   CBG:  Recent Labs Lab 08/04/15 2014 08/05/15 0010 08/05/15 0351 08/05/15 0758 08/05/15 1243  GLUCAP 97 113* 104* 106* 112*    Recent Results (from  the past 240 hour(s))  MRSA PCR Screening     Status: None   Collection Time: 08/04/15  2:00 AM  Result Value Ref Range Status   MRSA by PCR NEGATIVE NEGATIVE Final    Comment:        The GeneXpert MRSA Assay (FDA approved for NASAL specimens only), is one component of a comprehensive MRSA colonization surveillance program. It is not intended to diagnose MRSA infection nor to guide or monitor treatment for MRSA infections.       Studies:  Recent x-ray studies have been reviewed in detail by the Attending Physician  Scheduled Meds:  Scheduled Meds: . budesonide-formoterol  2 puff Inhalation BID  . doxycycline (VIBRAMYCIN) IV  100 mg Intravenous Q12H  . DULoxetine  60 mg Oral Daily  . folic acid  1 mg Intravenous Daily  . heparin  5,000 Units Subcutaneous 3 times per day  . insulin aspart  0-9 Units Subcutaneous 6 times per day  . magnesium oxide  400 mg Oral Daily  . multivitamin with minerals  1 tablet Oral Daily  . nicotine  21 mg Transdermal Daily  . sodium chloride  3 mL Intravenous Q12H  . thiamine  100 mg Oral Daily   Or  . thiamine  100 mg Intravenous Daily  . tiotropium  18 mcg Inhalation Daily    Time spent on care of this patient: 35 mins  Lonia Blood, MD [facility] For Consults/Admissions - Flow Manager - [phone] Office  [phone]  Contact MD directly via text page:      [email or web address]      password [facility]  08/05/2015, 4:22 PM   LOS: 2 days

## 2015-08-06 DIAGNOSIS — E875 Hyperkalemia: Secondary | ICD-10-CM | POA: Diagnosis present

## 2015-08-06 DIAGNOSIS — R7989 Other specified abnormal findings of blood chemistry: Secondary | ICD-10-CM | POA: Diagnosis present

## 2015-08-06 DIAGNOSIS — R894 Abnormal immunological findings in specimens from other organs, systems and tissues: Secondary | ICD-10-CM

## 2015-08-06 DIAGNOSIS — R945 Abnormal results of liver function studies: Secondary | ICD-10-CM

## 2015-08-06 DIAGNOSIS — R768 Other specified abnormal immunological findings in serum: Secondary | ICD-10-CM | POA: Diagnosis present

## 2015-08-06 LAB — COMPREHENSIVE METABOLIC PANEL
ALBUMIN: 3.1 g/dL — AB (ref 3.5–5.0)
ALK PHOS: 188 U/L — AB (ref 38–126)
ALK PHOS: 207 U/L — AB (ref 38–126)
ALT: 49 U/L (ref 14–54)
ALT: 73 U/L — ABNORMAL HIGH (ref 14–54)
AST: 189 U/L — AB (ref 15–41)
AST: 272 U/L — AB (ref 15–41)
Albumin: 2.8 g/dL — ABNORMAL LOW (ref 3.5–5.0)
Anion gap: 10 (ref 5–15)
Anion gap: 14 (ref 5–15)
BILIRUBIN TOTAL: 1.3 mg/dL — AB (ref 0.3–1.2)
BILIRUBIN TOTAL: 1.4 mg/dL — AB (ref 0.3–1.2)
BUN: 9 mg/dL (ref 6–20)
CALCIUM: 7.8 mg/dL — AB (ref 8.9–10.3)
CALCIUM: 8.4 mg/dL — AB (ref 8.9–10.3)
CO2: 22 mmol/L (ref 22–32)
CO2: 23 mmol/L (ref 22–32)
CREATININE: 0.55 mg/dL (ref 0.44–1.00)
CREATININE: 0.71 mg/dL (ref 0.44–1.00)
Chloride: 93 mmol/L — ABNORMAL LOW (ref 101–111)
Chloride: 94 mmol/L — ABNORMAL LOW (ref 101–111)
GFR calc Af Amer: >60 mL/min (ref >60)
GFR calc non Af Amer: >60 mL/min (ref >60)
GLUCOSE: 103 mg/dL — AB (ref 65–99)
Glucose, Bld: 113 mg/dL — ABNORMAL HIGH (ref 65–99)
Potassium: 6.4 mmol/L (ref 3.5–5.1)
Potassium: 4.3 mmol/L (ref 3.5–5.1)
Sodium: 125 mmol/L — ABNORMAL LOW (ref 135–145)
Sodium: 131 mmol/L — ABNORMAL LOW (ref 135–145)
TOTAL PROTEIN: 7.1 g/dL (ref 6.5–8.1)
TOTAL PROTEIN: 7.9 g/dL (ref 6.5–8.1)

## 2015-08-06 LAB — CBC
HCT: 35.3 % — ABNORMAL LOW (ref 36.0–46.0)
Hemoglobin: 11.5 g/dL — ABNORMAL LOW (ref 12.0–15.0)
MCH: 33 pg (ref 26.0–34.0)
MCHC: 32.6 g/dL (ref 30.0–36.0)
MCV: 101.4 fL — AB (ref 78.0–100.0)
PLATELETS: 160 10*3/uL (ref 150–400)
RBC: 3.48 MIL/uL — ABNORMAL LOW (ref 3.87–5.11)
RDW: 19.8 % — AB (ref 11.5–15.5)
WBC: 6.9 10*3/uL (ref 4.0–10.5)

## 2015-08-06 LAB — GLUCOSE, CAPILLARY
GLUCOSE-CAPILLARY: 110 mg/dL — AB (ref 65–99)
GLUCOSE-CAPILLARY: 114 mg/dL — AB (ref 65–99)
Glucose-Capillary: 111 mg/dL — ABNORMAL HIGH (ref 65–99)
Glucose-Capillary: 114 mg/dL — ABNORMAL HIGH (ref 65–99)
Glucose-Capillary: 134 mg/dL — ABNORMAL HIGH (ref 65–99)
Glucose-Capillary: 122 mg/dL — ABNORMAL HIGH (ref 65–99)
Glucose-Capillary: 105 mg/dL — ABNORMAL HIGH (ref 65–99)

## 2015-08-06 LAB — MAGNESIUM: Magnesium: 1.6 mg/dL — ABNORMAL LOW (ref 1.7–2.4)

## 2015-08-06 MED ORDER — SODIUM POLYSTYRENE SULFONATE 15 GM/60ML PO SUSP
45.0000 g | Freq: Once | ORAL | Status: AC
  Administered 2015-08-06: 45 g via ORAL
  Filled 2015-08-06: qty 180

## 2015-08-06 MED ORDER — DEXTROSE-NACL 5-0.9 % IV SOLN
INTRAVENOUS | Status: DC
  Administered 2015-08-06 – 2015-08-11 (×4): via INTRAVENOUS

## 2015-08-06 MED ORDER — DEXTROSE-NACL 5-0.45 % IV SOLN
INTRAVENOUS | Status: DC
  Administered 2015-08-06: 05:00:00 via INTRAVENOUS

## 2015-08-06 MED ORDER — SODIUM BICARBONATE 8.4 % IV SOLN
50.0000 meq | Freq: Once | INTRAVENOUS | Status: DC
  Filled 2015-08-06: qty 50

## 2015-08-06 MED ORDER — KETOROLAC TROMETHAMINE 30 MG/ML IJ SOLN
30.0000 mg | Freq: Once | INTRAMUSCULAR | Status: AC
  Administered 2015-08-06: 30 mg via INTRAVENOUS
  Filled 2015-08-06: qty 1

## 2015-08-06 MED ORDER — HYDRALAZINE HCL 20 MG/ML IJ SOLN
10.0000 mg | Freq: Once | INTRAMUSCULAR | Status: AC
  Administered 2015-08-06: 10 mg via INTRAVENOUS
  Filled 2015-08-06: qty 1

## 2015-08-06 NOTE — Progress Notes (Signed)
CRITICAL VALUE ALERT  Critical value received:  Potassium 6.4  Date of notification:  08/06/2015  Time of notification:  0451  Critical value read back:Yes.    Nurse who received alert:  Dianna Rossetti, RN  MD notified (1st page):  Tama Gander, NP  Time of first page:  [phone]  MD notified (2nd page):  Time of second page:  Responding MD:  Tama Gander, NP  Time MD responded:  0507, orders received for EKG and to change fluids to D5-1/2NS. Will continue to monitor.  Herma Ard, RN

## 2015-08-06 NOTE — Progress Notes (Signed)
UR COMPLETED  

## 2015-08-06 NOTE — Progress Notes (Signed)
[place] TEAM 1 - Stepdown/ICU TEAM Progress Note  Rhonda Mitchell ZOX:[id number] DOB: [birth year] DOA: 08/03/2015 PCP: [name], MD  Admit HPI / Brief Narrative: 54 y.o. WF PMHx alcohol abuse,tobacco abuse and COPD non-oxygen dependent, hepatitis C.  Comes in for shortness of breath that started several days prior to admission. The patient is sleepy and cannot provide a history so most of the history is obtained from the chart. As per chart the patient had been short of breath last several days. She denies any cough or fever.  In the ED: She was found to be hypoxic and was placed on a nonrebreather and her oxygen saturations came up, she was given an hour-long treatment of albuterol but she was still desatted into the 70s. Chest x-ray was done that shows no infiltrates, her LFTs are mildly elevated, she was also found with an alcohol level of 436 she was started on Ativan protocol and she is only responsive to pain.   HPI/Subjective: 8/16 A/O 3, (does not know why), cachectic, tremors, appears much older than stated age. States has been smoking 1/2 PPD 40+ years  Assessment/Plan: Acute respiratory failure with hypoxia due to COPD exacerbation: - Symbicort 80-4 0.5 BID -Spiriva daily - Titrate O2 to maintain SPO2 88 -93%  -Continue antibiotics for 7 day course  Alcohol dependence/ Alcohol withdrawal -On admission alcohol 436 -UDS pending -Continue on CIWA protocol, Bedside sitter -Restraints when appropriate  Tobacco abuse -Nicoderm patch  Hyperglycemia - 8/14 Hemoglobin A1c= 5.6  -Sensitive SSI   Hyperkalemia -Kayexalate 45 gm x1 -1 amp sodium bicarbonate -Repeat BMP/magnesium  Protein-calorie malnutrition, severe - Consult nutrition once patient is awake, started on ensure 3 times a day as the patient is able to communicate.  Hyponatremia - Iatrogenic DC D5-0.45% saline  -Start D5-0.9% saline 75 ml/hr  Elevated LFTs: -Likely due to alcohol abuse - improving    Macrocytic anemia: -Anemia panel unrevealing   Infectious -HIV negative -RPR negative -Acute hepatitis panel positive hepatitis C virus antibody -Will check quant viral load  -Will need hepatitis B vaccination      Code Status: FULL Family Communication: no family present at time of exam Disposition Plan: [facility] alcohol detoxification    Consultants:   Procedure/Significant Events:    Culture   Antibiotics: Doxycycline 8/14>>   DVT prophylaxis: Subcutaneous heparin   Devices    LINES / TUBES:      Continuous Infusions: . dextrose 5 % and 0.45% NaCl 50 mL/hr at 08/06/15 0522    Objective: VITAL SIGNS: Temp: 98.1 F (36.7 C) (08/16 1131) Temp Source: Axillary (08/16 1131) BP: 130/89 mmHg (08/16 1131) Pulse Rate: 75 (08/16 1131) SPO2; FIO2:   Intake/Output Summary (Last 24 hours) at 08/06/15 1517 Last data filed at 08/06/15 0800  Gross per 24 hour  Intake 918.33 ml  Output     50 ml  Net 868.33 ml     Exam: General: A/O 4, patient cachectic, tremors, appears much older than stated age, No acute respiratory distress Eyes: Negative headache, eye pain, scleral hemorrhage ENT: Negative Runny nose, negative ear pain, negative gingival bleeding, Neck:  Negative scars, masses, torticollis, lymphadenopathy, JVD Lungs: Clear to auscultation bilaterally without wheezes or crackles Cardiovascular: Tachycardic, Regular rhythm without murmur gallop or rub normal S1 and S2 Abdomen:negative abdominal pain, negative dysphagia, nondistended, positive soft, bowel sounds, no rebound, no ascites, no appreciable mass Extremities: No significant cyanosis, clubbing, or edema bilateral lower extremities Psychiatric:  Unable to assess  Neurologic:  Unable to  fully assess, patient with general body tremors, patient does follow commands though becomes confused easily and requires redirection    Data Reviewed: Basic Metabolic Panel:  Recent Labs Lab  08/03/15 1628 08/04/15 0240 08/04/15 1107 08/06/15 0345  NA 137 141 141 125*  K 3.1* 3.9 4.8 6.4*  CL 99* 105 104 93*  CO2 21* 20* 27 22  GLUCOSE 207* 174* 84 113*  BUN <5*  CREATININE 0.97 0.77  0.77 0.84 0.55  CALCIUM 7.8* 7.3* 7.6* 7.8*  MG  --  2.2 1.8  --    Liver Function Tests:  Recent Labs Lab 08/03/15 1730 08/04/15 1107 08/06/15 0345  AST 226* 147* 189*  ALT 67* 58* 49  ALKPHOS 201* 174* 188*  BILITOT 0.5 0.5 1.3*  PROT 6.3* 6.3* 7.1  ALBUMIN 3.0* 2.7* 2.8*   No results for input(s): LIPASE, AMYLASE in the last 168 hours.  Recent Labs Lab 08/03/15 1733  AMMONIA 39*   CBC:  Recent Labs Lab 08/03/15 1628 08/04/15 0240 08/04/15 1107 08/06/15 0345  WBC 7.3 5.6  5.7 5.0 6.9  HGB 9.7* 9.2*  9.4* 8.4* 11.5*  HCT 29.6* 28.4*  29.2* 26.2* 35.3*  MCV 101.4* 102.2*  102.1* 101.2* 101.4*  PLT 128* 133*  125* 121* 160   Cardiac Enzymes: No results for input(s): CKTOTAL, CKMB, CKMBINDEX, TROPONINI in the last 168 hours. BNP (last 3 results)  Recent Labs  03/18/15 2109 08/03/15 1820  BNP 234.0* 55.1    ProBNP (last 3 results) No results for input(s): PROBNP in the last 8760 hours.  CBG:  Recent Labs Lab 08/05/15 2005 08/05/15 2346 08/06/15 0430 08/06/15 0748 08/06/15 1126  GLUCAP 110* 110* 111* 114* 114*    Recent Results (from the past 240 hour(s))  MRSA PCR Screening     Status: None   Collection Time: 08/04/15  2:00 AM  Result Value Ref Range Status   MRSA by PCR NEGATIVE NEGATIVE Final    Comment:        The GeneXpert MRSA Assay (FDA approved for NASAL specimens only), is one component of a comprehensive MRSA colonization surveillance program. It is not intended to diagnose MRSA infection nor to guide or monitor treatment for MRSA infections.      Studies:  Recent x-ray studies have been reviewed in detail by the Attending Physician  Scheduled Meds:  Scheduled Meds: . antiseptic oral rinse  7 mL Mouth  Rinse q12n4p  . budesonide-formoterol  2 puff Inhalation BID  . chlorhexidine  15 mL Mouth Rinse BID  . doxycycline (VIBRAMYCIN) IV  100 mg Intravenous Q12H  . DULoxetine  60 mg Oral Daily  . folic acid  1 mg Intravenous Daily  . heparin  5,000 Units Subcutaneous 3 times per day  . insulin aspart  0-9 Units Subcutaneous 6 times per day  . nicotine  21 mg Transdermal Daily  . sodium chloride  3 mL Intravenous Q12H  . thiamine  100 mg Intravenous Daily  . tiotropium  18 mcg Inhalation Daily    Time spent on care of this patient: 40 mins   Rhonda Mitchell, Roselind Messier , MD  [facility] Office  [phone] Pager [phone]  On-Call/Text Page:      [email or web address]      password [email or web address]  If 7PM-7AM, please contact night-coverage [email or web address] Password [email or web address] 08/06/2015, 3:17 PM   LOS: 3 days   Care during the described time interval was provided by me .  I have reviewed this patient's  available data, including medical history, events of note, physical examination, and all test results as part of my evaluation. I have personally reviewed and interpreted all radiology studies.   Dia Crawford, MD [phone] Pager

## 2015-08-07 LAB — COMPREHENSIVE METABOLIC PANEL
ALBUMIN: 2.7 g/dL — AB (ref 3.5–5.0)
ALT: 71 U/L — AB (ref 14–54)
AST: 249 U/L — AB (ref 15–41)
Alkaline Phosphatase: 184 U/L — ABNORMAL HIGH (ref 38–126)
Anion gap: 11 (ref 5–15)
BUN: 9 mg/dL (ref 6–20)
CHLORIDE: 101 mmol/L (ref 101–111)
CO2: 24 mmol/L (ref 22–32)
Calcium: 8.2 mg/dL — ABNORMAL LOW (ref 8.9–10.3)
Creatinine, Ser: 0.64 mg/dL (ref 0.44–1.00)
GFR calc Af Amer: >60 mL/min (ref >60)
Glucose, Bld: 118 mg/dL — ABNORMAL HIGH (ref 65–99)
POTASSIUM: 4.3 mmol/L (ref 3.5–5.1)
SODIUM: 136 mmol/L (ref 135–145)
Total Bilirubin: 0.9 mg/dL (ref 0.3–1.2)
Total Protein: 6.5 g/dL (ref 6.5–8.1)

## 2015-08-07 LAB — CBC WITH DIFFERENTIAL/PLATELET
Basophils Absolute: 0 10*3/uL (ref 0.0–0.1)
Basophils Relative: 0 % (ref 0–1)
EOS PCT: 2 % (ref 0–5)
Eosinophils Absolute: 0.1 10*3/uL (ref 0.0–0.7)
HCT: 36.1 % (ref 36.0–46.0)
HEMOGLOBIN: 12 g/dL (ref 12.0–15.0)
LYMPHS ABS: 1 10*3/uL (ref 0.7–4.0)
LYMPHS PCT: 19 % (ref 12–46)
MCH: 33.8 pg (ref 26.0–34.0)
MCHC: 33.2 g/dL (ref 30.0–36.0)
MCV: 101.7 fL — AB (ref 78.0–100.0)
MONOS PCT: 10 % (ref 3–12)
Monocytes Absolute: 0.5 10*3/uL (ref 0.1–1.0)
Neutro Abs: 3.6 10*3/uL (ref 1.7–7.7)
Neutrophils Relative %: 69 % (ref 43–77)
PLATELETS: 168 10*3/uL (ref 150–400)
RBC: 3.55 MIL/uL — AB (ref 3.87–5.11)
RDW: 19.9 % — ABNORMAL HIGH (ref 11.5–15.5)
WBC: 5.2 10*3/uL (ref 4.0–10.5)

## 2015-08-07 LAB — HCV RNA QUANT
HCV Quantitative Log: 5.455 log10 IU/mL (ref >1.70)
HCV Quantitative: 285000 IU/mL (ref >50)

## 2015-08-07 LAB — GLUCOSE, CAPILLARY
GLUCOSE-CAPILLARY: 101 mg/dL — AB (ref 65–99)
GLUCOSE-CAPILLARY: 103 mg/dL — AB (ref 65–99)
Glucose-Capillary: 123 mg/dL — ABNORMAL HIGH (ref 65–99)
Glucose-Capillary: 108 mg/dL — ABNORMAL HIGH (ref 65–99)

## 2015-08-07 LAB — MAGNESIUM: Magnesium: 1.7 mg/dL (ref 1.7–2.4)

## 2015-08-07 MED ORDER — VITAMIN B-1 100 MG PO TABS
100.0000 mg | ORAL_TABLET | Freq: Every day | ORAL | Status: DC
  Administered 2015-08-09 – 2015-08-12 (×4): 100 mg via ORAL
  Filled 2015-08-07 (×6): qty 1

## 2015-08-07 MED ORDER — KETOROLAC TROMETHAMINE 30 MG/ML IJ SOLN
30.0000 mg | Freq: Once | INTRAMUSCULAR | Status: AC
  Administered 2015-08-07: 30 mg via INTRAVENOUS
  Filled 2015-08-07: qty 1

## 2015-08-07 MED ORDER — MORPHINE SULFATE (PF) 2 MG/ML IV SOLN
2.0000 mg | Freq: Once | INTRAVENOUS | Status: AC
  Administered 2015-08-07: 2 mg via INTRAVENOUS
  Filled 2015-08-07: qty 1

## 2015-08-07 MED ORDER — HALOPERIDOL LACTATE 5 MG/ML IJ SOLN
2.5000 mg | Freq: Once | INTRAMUSCULAR | Status: AC
  Administered 2015-08-07: 2.5 mg via INTRAVENOUS
  Filled 2015-08-07: qty 1

## 2015-08-07 MED ORDER — MORPHINE SULFATE (PF) 4 MG/ML IV SOLN
4.0000 mg | Freq: Once | INTRAVENOUS | Status: AC
  Administered 2015-08-07: 4 mg via INTRAVENOUS
  Filled 2015-08-07: qty 1

## 2015-08-07 MED ORDER — FOLIC ACID 1 MG PO TABS
1.0000 mg | ORAL_TABLET | Freq: Every day | ORAL | Status: DC
Start: 2015-08-08 — End: 2015-08-28
  Administered 2015-08-09 – 2015-08-28 (×20): 1 mg via ORAL
  Filled 2015-08-07 (×22): qty 1

## 2015-08-07 MED ORDER — INSULIN ASPART 100 UNIT/ML ~~LOC~~ SOLN: 0.0000 [IU] | Freq: Three times a day (TID) | SUBCUTANEOUS | Status: DC

## 2015-08-07 MED ORDER — LORAZEPAM 1 MG PO TABS
1.0000 mg | ORAL_TABLET | ORAL | Status: DC | PRN
  Administered 2015-08-07: 1 mg via ORAL
  Filled 2015-08-07: qty 1

## 2015-08-07 NOTE — Progress Notes (Signed)
[place] TEAM 1 - Stepdown/ICU TEAM Progress Note  Rhonda Mitchell [name]:[id number] DOB: [birth year] DOA: 08/03/2015 PCP: [name], MD  Admit HPI / Brief Narrative: 54 y.o. F Hx alcohol abuse, tobacco abuse, COPD (non-oxygen dependent), and hepatitis who presented w/ shortness of breath that started several days prior to admission.   In the ED she was found to be hypoxic and was placed on a nonrebreather. Chest x-ray showed no infiltrates, her LFTs were mildly elevated, and she was found with an alcohol level of 436.    HPI/Subjective: The patient became increasingly agitated this morning and required additional doses of Ativan.  Presently she is somewhat alert but confused and unable to provide a reliable review of systems.  Assessment/Plan:  Acute hypoxic respiratory failure due to COPD exacerbation and extreme EtOH intoxication Resolved - most likely contributor severe alcohol intoxication with respiratory depression   Alcohol dependence / Alcohol withdrawal -On admission alcohol level 436 -UDS otherwise negative -Continue thiamine and folate IV -Continue CIWA - patient still an active withdrawal at this time  Tobacco abuse -Nicoderm patch  Hyperglycemia -A1c 5.6 - CBGs well controlled  Hypokalemia -Likely due to alcohol abuse - resolved w/ supplementation - Mg normal   Protein-calorie malnutrition, severe -Consult nutrition once patient is awake and able to consistently consume mills  Hyponatremia -corrected w/ volume expansion  Elevated LFTs -Likely due to alcohol abuse - slowly improving in absence of alcohol and with volume expansion  Hepatitis C + -quant viral load 285K - will need to be followed in outpatient setting though patient likely not presently a candidate for treatment given her ongoing alcohol abuse  Macrocytic anemia -Anemia panel unrevealing - hemoglobin has actually normalized and absence of alcohol  Code Status: FULL Family Communication: no  family present at time of exam Disposition Plan: SDU until withdrawal resolved  Consultants: None  Procedures: None  Antibiotics: Doxycycline 8/13 >  DVT prophylaxis: SQ heparin   Objective: Blood pressure 131/80, pulse 80, temperature 98.4 F (36.9 C), temperature source Oral, resp. rate 24, height 5' (1.524 m), weight 61.3 kg (135 lb 2.3 oz), SpO2 99 %.  Intake/Output Summary (Last 24 hours) at 08/07/15 1611 Last data filed at 08/07/15 0700  Gross per 24 hour  Intake   1075 ml  Output    200 ml  Net    875 ml   Exam: General: No acute respiratory distress - alert but delirious Lungs: Clear to auscultation bilaterally without wheezes / crackles Cardiovascular: Regular rate and rhythm without murmur gallop or rub  Abdomen: Nontender, nondistended, soft, bowel sounds positive, no rebound, no ascites, no appreciable mass Extremities: No significant cyanosis, clubbing, edema bilateral lower extremities  Data Reviewed: Basic Metabolic Panel:  Recent Labs Lab 08/04/15 0240 08/04/15 1107 08/06/15 0345 08/06/15 2020 08/06/15 2234 08/07/15 0245  NA 141 141 125* 131*  --  136  K 3.9 4.8 6.4* 4.3  --  4.3  CL 105 104 93* 94*  --  101  CO2 20* --  24  GLUCOSE 174* 84 113* 103*  --  118*  BUN 6 10 <5* 9  --  9  CREATININE 0.77  0.77 0.84 0.55 0.71  --  0.64  CALCIUM 7.3* 7.6* 7.8* 8.4*  --  8.2*  MG 2.2 1.8  --   --  1.6* 1.7   Liver Function Tests:  Recent Labs Lab 08/03/15 1730 08/04/15 1107 08/06/15 0345 08/06/15 2020 08/07/15 0245  AST 226* 147* 189*  272* 249*  ALT 67* 58* 49 73* 71*  ALKPHOS 201* 174* 188* 207* 184*  BILITOT 0.5 0.5 1.3* 1.4* 0.9  PROT 6.3* 6.3* 7.1 7.9 6.5  ALBUMIN 3.0* 2.7* 2.8* 3.1* 2.7*    Recent Labs Lab 08/03/15 1733  AMMONIA 39*   CBC:  Recent Labs Lab 08/03/15 1628 08/04/15 0240 08/04/15 1107 08/06/15 0345 08/07/15 0245  WBC 7.3 5.6  5.7 5.0 6.9 5.2  NEUTROABS  --   --   --   --  3.6  HGB 9.7* 9.2*   9.4* 8.4* 11.5* 12.0  HCT 29.6* 28.4*  29.2* 26.2* 35.3* 36.1  MCV 101.4* 102.2*  102.1* 101.2* 101.4* 101.7*  PLT 128* 133*  125* 121* 160 168   CBG:  Recent Labs Lab 08/06/15 1630 08/06/15 1930 08/06/15 2318 08/07/15 0402 08/07/15 0754  GLUCAP 134* 122* 105* 123* 101*    Recent Results (from the past 240 hour(s))  MRSA PCR Screening     Status: None   Collection Time: 08/04/15  2:00 AM  Result Value Ref Range Status   MRSA by PCR NEGATIVE NEGATIVE Final    Comment:        The GeneXpert MRSA Assay (FDA approved for NASAL specimens only), is one component of a comprehensive MRSA colonization surveillance program. It is not intended to diagnose MRSA infection nor to guide or monitor treatment for MRSA infections.      Studies:  Recent x-ray studies have been reviewed in detail by the Attending Physician  Scheduled Meds:  Scheduled Meds: . antiseptic oral rinse  7 mL Mouth Rinse q12n4p  . budesonide-formoterol  2 puff Inhalation BID  . chlorhexidine  15 mL Mouth Rinse BID  . doxycycline (VIBRAMYCIN) IV  100 mg Intravenous Q12H  . DULoxetine  60 mg Oral Daily  . [START ON 08/08/2015] folic acid  1 mg Oral Daily  . heparin  5,000 Units Subcutaneous 3 times per day  . insulin aspart  0-9 Units Subcutaneous 6 times per day  . nicotine  21 mg Transdermal Daily  . sodium bicarbonate  50 mEq Intravenous Once  . sodium chloride  3 mL Intravenous Q12H  . [START ON 08/08/2015] thiamine  100 mg Oral Daily  . tiotropium  18 mcg Inhalation Daily    Time spent on care of this patient: 35 mins  Lonia Blood, MD [facility] For Consults/Admissions - Flow Manager - [phone] Office  [phone]  Contact MD directly via text page:      [email or web address]      password [email or web address]  08/07/2015, 4:11 PM   LOS: 4 days

## 2015-08-07 NOTE — Progress Notes (Signed)
Was the fall witnessed: No staff at bedside.  Pt had sitter before fall occurred,  patient was found on all fours on the padded mat bedside of the bed  Patient condition before and after the fall: Patient has some confusion before and after all, no changes post fall.  Pt. Is still restless with frequent stools  Patient's reaction to the fall:  Pt states she will stay in bed and call for assistance when she needs help.  Educated pt about wrist restraints  Name of the doctor that was notified including date and time: Tama Gander NP notified:  8/16  2215.  Wrist restraints reordered from previous day  Any interventions and vital signs:  98.6 temp, BP 108/84, HR 96, RR 12 on 4L [place] at this time.  Door and curtains open, bed alarm on, wrist restraints applied, No sitter available  Pt. Remains stable, will continue to monitor closely

## 2015-08-08 DIAGNOSIS — F10231 Alcohol dependence with withdrawal delirium: Secondary | ICD-10-CM

## 2015-08-08 DIAGNOSIS — E46 Unspecified protein-calorie malnutrition: Secondary | ICD-10-CM

## 2015-08-08 LAB — COMPREHENSIVE METABOLIC PANEL
ALT: 87 U/L — AB (ref 14–54)
ANION GAP: 11 (ref 5–15)
AST: 278 U/L — ABNORMAL HIGH (ref 15–41)
Albumin: 2.9 g/dL — ABNORMAL LOW (ref 3.5–5.0)
Alkaline Phosphatase: 181 U/L — ABNORMAL HIGH (ref 38–126)
BUN: 7 mg/dL (ref 6–20)
CHLORIDE: 100 mmol/L — AB (ref 101–111)
CO2: 25 mmol/L (ref 22–32)
Calcium: 8.9 mg/dL (ref 8.9–10.3)
Creatinine, Ser: 0.62 mg/dL (ref 0.44–1.00)
Glucose, Bld: 105 mg/dL — ABNORMAL HIGH (ref 65–99)
POTASSIUM: 3.9 mmol/L (ref 3.5–5.1)
SODIUM: 136 mmol/L (ref 135–145)
Total Bilirubin: 1.3 mg/dL — ABNORMAL HIGH (ref 0.3–1.2)
Total Protein: 6.8 g/dL (ref 6.5–8.1)

## 2015-08-08 LAB — CBC WITH DIFFERENTIAL/PLATELET
Basophils Absolute: 0 10*3/uL (ref 0.0–0.1)
Basophils Relative: 0 % (ref 0–1)
Eosinophils Absolute: 0.1 10*3/uL (ref 0.0–0.7)
Eosinophils Relative: 2 % (ref 0–5)
HCT: 31.1 % — ABNORMAL LOW (ref 36.0–46.0)
Hemoglobin: 10.2 g/dL — ABNORMAL LOW (ref 12.0–15.0)
Lymphocytes Relative: 17 % (ref 12–46)
Lymphs Abs: 0.9 10*3/uL (ref 0.7–4.0)
MCH: 33.1 pg (ref 26.0–34.0)
MCHC: 32.8 g/dL (ref 30.0–36.0)
MCV: 101 fL — ABNORMAL HIGH (ref 78.0–100.0)
Monocytes Absolute: 0.6 10*3/uL (ref 0.1–1.0)
Monocytes Relative: 11 % (ref 3–12)
Neutro Abs: 3.6 10*3/uL (ref 1.7–7.7)
Neutrophils Relative %: 70 % (ref 43–77)
Platelets: 173 10*3/uL (ref 150–400)
RBC: 3.08 MIL/uL — ABNORMAL LOW (ref 3.87–5.11)
RDW: 19.9 % — ABNORMAL HIGH (ref 11.5–15.5)
WBC: 5.2 10*3/uL (ref 4.0–10.5)

## 2015-08-08 LAB — GLUCOSE, CAPILLARY
GLUCOSE-CAPILLARY: 111 mg/dL — AB (ref 65–99)
Glucose-Capillary: 100 mg/dL — ABNORMAL HIGH (ref 65–99)
Glucose-Capillary: 110 mg/dL — ABNORMAL HIGH (ref 65–99)
Glucose-Capillary: 109 mg/dL — ABNORMAL HIGH (ref 65–99)

## 2015-08-08 LAB — MAGNESIUM: Magnesium: 1.4 mg/dL — ABNORMAL LOW (ref 1.7–2.4)

## 2015-08-08 LAB — AMMONIA: AMMONIA: 37 umol/L — AB (ref 9–35)

## 2015-08-08 MED ORDER — LORAZEPAM 2 MG/ML IJ SOLN
3.0000 mg | INTRAMUSCULAR | Status: DC
  Administered 2015-08-08 (×2): 3 mg via INTRAVENOUS
  Filled 2015-08-08: qty 2

## 2015-08-08 MED ORDER — LORAZEPAM 2 MG/ML IJ SOLN
2.0000 mg | INTRAMUSCULAR | Status: DC | PRN
  Administered 2015-08-08 – 2015-08-09 (×2): 3 mg via INTRAVENOUS
  Administered 2015-08-09: 2 mg via INTRAVENOUS
  Administered 2015-08-09 (×2): 3 mg via INTRAVENOUS
  Filled 2015-08-08 (×5): qty 2

## 2015-08-08 MED ORDER — DEXMEDETOMIDINE HCL IN NACL 200 MCG/50ML IV SOLN
0.4000 ug/kg/h | INTRAVENOUS | Status: DC
  Filled 2015-08-08: qty 50

## 2015-08-08 MED ORDER — ADULT MULTIVITAMIN W/MINERALS CH
1.0000 | ORAL_TABLET | Freq: Every day | ORAL | Status: DC
  Administered 2015-08-09 – 2015-08-28 (×21): 1 via ORAL
  Filled 2015-08-08 (×22): qty 1

## 2015-08-08 MED ORDER — LORAZEPAM 2 MG/ML IJ SOLN
3.0000 mg | INTRAMUSCULAR | Status: DC
  Administered 2015-08-08 – 2015-08-09 (×3): 3 mg via INTRAVENOUS
  Filled 2015-08-08 (×3): qty 2

## 2015-08-08 MED ORDER — DOXYCYCLINE HYCLATE 100 MG PO TABS
100.0000 mg | ORAL_TABLET | Freq: Two times a day (BID) | ORAL | Status: DC
  Administered 2015-08-08 – 2015-08-09 (×2): 100 mg via ORAL
  Filled 2015-08-08 (×5): qty 1

## 2015-08-08 MED ORDER — MAGNESIUM SULFATE 50 % IJ SOLN
3.0000 g | Freq: Once | INTRAVENOUS | Status: AC
  Administered 2015-08-08: 3 g via INTRAVENOUS
  Filled 2015-08-08: qty 6

## 2015-08-08 MED ORDER — LORAZEPAM 2 MG/ML IJ SOLN
1.0000 mg/h | INTRAMUSCULAR | Status: DC
  Filled 2015-08-08: qty 25

## 2015-08-08 NOTE — Consult Note (Signed)
PULMONARY / CRITICAL CARE MEDICINE   Name: Rhonda Mitchell MRN: [id number] DOB: 24-Nov-1961    ADMISSION DATE:  08/03/2015 CONSULTATION DATE:  08/08/2015  REFERRING MD :  Joseph Art  CHIEF COMPLAINT:  ETOH withdrawal  INITIAL PRESENTATION:  54 y.o. F with excessive ETOH use, presented 8/13 with SOB.  Found to have transaminitis and ETOH level of 436.  She was admitted and treated with as needed ativan.  On 8/18, she had worsening agitation despite frequent agitation; therefore was transferred to the ICU for consideration of precedex.   STUDIES:  none  SIGNIFICANT EVENTS: 8/13 - admitted 8/18 - transferred to ICU   HISTORY OF PRESENT ILLNESS:  Rhonda Mitchell is a 54 y.o. F with PMH as outlined below including excessive daily alcohol use, she admits to 1 pint of vodka per day.  She was admitted to [facility] 8/13 with SOB.  In ED, she was hypoxic and was found to have transaminitis with elevated alcohol level of 436.  She was subsequently admitted by the hospitalist team for further management and was treated with as needed Ativan.  On 8/18, pt had increasing agitation despite being administered frequent ativan, apparently 3mg  every hour.  She was subsequently transferred to the ICU for consideration of precedex.  After arrival to ICU, pt appears to be relatively calm.  She is A&O x 3 and able to hold an appropriate conversation.  She has no tachycardia, tachypnea, diaphoresis, tremors, hallucinations.   PAST MEDICAL HISTORY :   has a past medical history of COPD (chronic obstructive pulmonary disease); Anxiety; Shortness of breath; Arthritis; GERD (gastroesophageal reflux disease); Full dentures; Insomnia; Alcohol abuse; Hepatitis C; Alcohol abuse; History of MRSA infection; and History of encephalopathy.  has past surgical history that includes arm surgery; Hand surgery (1990); Shoulder surgery; ORIF ulnar / radial shaft fracture (1990); ORIF ulnar fracture (Left, 05/23/2014); and I&D extremity  (Right, 05/07/2015). Prior to Admission medications   Medication Sig Start Date End Date Taking? Authorizing Provider  albuterol-ipratropium (COMBIVENT) 18-103 MCG/ACT inhaler Inhale 2 puffs into the lungs every 6 (six) hours as needed for wheezing. 03/25/15  Yes Shanker Levora Dredge, MD  budesonide-formoterol (SYMBICORT) 80-4.5 MCG/ACT inhaler Inhale 2 puffs into the lungs 2 (two) times daily. 03/25/15  Yes Shanker Levora Dredge, MD  clindamycin (CLEOCIN) 150 MG capsule Take 3 capsules (450 mg total) by mouth 3 (three) times daily. 07/27/15  Yes Charlestine Night, PA-C  diphenhydrAMINE (BENADRYL) 25 MG tablet Take 50 mg by mouth daily.   Yes Historical Provider, MD  DULoxetine (CYMBALTA) 30 MG capsule Take 60 mg by mouth daily.   Yes Historical Provider, MD  folic acid (FOLVITE) 1 MG tablet Take 1 tablet (1 mg total) by mouth daily. 05/11/15  Yes Costin Otelia Sergeant, MD  ibuprofen (ADVIL,MOTRIN) 200 MG tablet Take 400-600 mg by mouth every 6 (six) hours as needed (knee pain).   Yes Historical Provider, MD  MAGNESIUM PO Take 1 tablet by mouth daily.   Yes Historical Provider, MD  Menthol-Methyl Salicylate (ICY HOT EXTRA STRENGTH) 10-30 % CREA Apply 1 application topically daily as needed (for knee pain).   Yes Historical Provider, MD  Multiple Vitamin (MULTIVITAMIN WITH MINERALS) TABS tablet Take 1 tablet by mouth daily. 05/11/15  Yes Costin Otelia Sergeant, MD  Potassium (POTASSIMIN PO) Take 1 tablet by mouth daily.   Yes Historical Provider, MD  thiamine 100 MG tablet Take 1 tablet (100 mg total) by mouth daily. 05/11/15  Yes Costin Otelia Sergeant, MD  tiotropium [facility])  18 MCG inhalation capsule Place 1 capsule (18 mcg total) into inhaler and inhale daily. 03/25/15  Yes Shanker Levora Dredge, MD  vitamin B-12 (CYANOCOBALAMIN) 1000 MCG tablet Take 1 tablet (1,000 mcg total) by mouth daily. 05/11/15  Yes Costin Otelia Sergeant, MD  Witch Hazel (PREPARATION H EX) Apply 1 application topically daily as needed (for hemorrhoids).    Yes  Historical Provider, MD   No Known Allergies  FAMILY HISTORY:  Family History  Problem Relation Age of Onset  . Breast cancer Mother     SOCIAL HISTORY:  reports that she has been smoking Cigarettes.  She has a 20 pack-year smoking history. She has never used smokeless tobacco. She reports that she drinks alcohol. She reports that she does not use illicit drugs.  REVIEW OF SYSTEMS:   All negative; except for those that are bolded, which indicate positives.  Constitutional: weight loss, weight gain, night sweats, fevers, chills, fatigue, weakness.  HEENT: headaches, sore throat, sneezing, nasal congestion, post nasal drip, difficulty swallowing, tooth/dental problems, visual complaints, visual changes, ear aches. Neuro: difficulty with speech, weakness, numbness, ataxia. CV:  chest pain, orthopnea, PND, swelling in lower extremities, dizziness, palpitations, syncope.  Resp: cough, hemoptysis, dyspnea, wheezing. GI  heartburn, indigestion, abdominal pain, nausea, vomiting, diarrhea, constipation, change in bowel habits, loss of appetite, hematemesis, melena, hematochezia.  GU: dysuria, change in color of urine, urgency or frequency, flank pain, hematuria. MSK: joint pain or swelling, decreased range of motion. Psych: change in mood or affect, depression, anxiety, suicidal ideations, homicidal ideations. Skin: rash, itching, bruising.   SUBJECTIVE: Denies headache, SOB, chest pain, N/V/D, abd pain, tremor.  VITAL SIGNS: Temp:  [97.6 F (36.4 C)-98.6 F (37 C)] 97.9 F (36.6 C) (08/18 2026) Pulse Rate:  [78-102] 95 (08/18 2000) Resp:  [16-25] 19 (08/18 2000) BP: (115-175)/(39-113) 139/89 mmHg (08/18 1535) SpO2:  [93 %-100 %] 98 % (08/18 2000) HEMODYNAMICS:   VENTILATOR SETTINGS:   INTAKE / OUTPUT: Intake/Output      08/18 0701 - 08/19 0700   P.O.    I.V. (mL/kg) 350 (5.7)   IV Piggyback    Total Intake(mL/kg) 350 (5.7)   Urine (mL/kg/hr)    Stool    Total Output      Net +350       Urine Occurrence 2 x     PHYSICAL EXAMINATION: General: Chronically ill appearing female, in NAD. Neuro: A&O x 3, non-focal.  HEENT: [place]/AT. PERRL, sclerae anicteric. Cardiovascular: RRR, no M/R/G.  Lungs: Respirations even and unlabored.  CTA bilaterally, No W/R/R. Abdomen: BS x 4, soft, NT/ND.  Musculoskeletal: No gross deformities, no edema.  Skin: Intact, warm, no rashes.  LABS:  CBC  Recent Labs Lab 08/06/15 0345 08/07/15 0245 08/08/15 0315  WBC 6.9 5.2 5.2  HGB 11.5* 12.0 10.2*  HCT 35.3* 36.1 31.1*  PLT 160 168 173   Coag's No results for input(s): APTT, INR in the last 168 hours. BMET  Recent Labs Lab 08/06/15 2020 08/07/15 0245 08/08/15 0315  NA 131* 136 136  K 4.3 4.3 3.9  CL 94* 101 100*  CO2 23 24 25   BUN 9 9 7   CREATININE 0.71 0.64 0.62  GLUCOSE 103* 118* 105*   Electrolytes  Recent Labs Lab 08/06/15 2020 08/06/15 2234 08/07/15 0245 08/08/15 0315  CALCIUM 8.4*  --  8.2* 8.9  MG  --  1.6* 1.7 1.4*   Sepsis Markers No results for input(s): LATICACIDVEN, PROCALCITON, O2SATVEN in the last 168 hours. ABG  Recent  Labs Lab 08/03/15 2138  PHART 7.319*  PCO2ART 52.8*  PO2ART 78.0*   Liver Enzymes  Recent Labs Lab 08/06/15 2020 08/07/15 0245 08/08/15 0315  AST 272* 249* 278*  ALT 73* 71* 87*  ALKPHOS 207* 184* 181*  BILITOT 1.4* 0.9 1.3*  ALBUMIN 3.1* 2.7* 2.9*   Cardiac Enzymes No results for input(s): TROPONINI, PROBNP in the last 168 hours. Glucose  Recent Labs Lab 08/07/15 0754 08/07/15 2014 08/07/15 2331 08/08/15 0743 08/08/15 1207 08/08/15 1536  GLUCAP 101* 108* 103* 100* 110* 111*    Imaging No results found.   ASSESSMENT / PLAN:  NEUROLOGIC A:   ETOH abuse - ETOH level 436 on admission. Hx anxiety, insomnia. P:   Continue PRN ativan per CIWA protocol. RASS goal: 0 to -1. Continue duloxetine, thiamine, folate, multivitamin.  PULMONARY A: COPD by report (No PFT's in  system). Tobacco use disorder. P:   Continue symbicort, albuterol, spiriva. Nicotine patch. Tobacco cessation counseling.  CARDIOVASCULAR A:  No acute issues. P:  Monitor hemodynamics.  RENAL A:   Hypomagnesemia - repleted 8/18. P:   D5NS @ 75. BMP in AM.  GASTROINTESTINAL A:   Transaminitis with AST > ALT - c/w ETOH abuse. GERD, HCV. Nutrition. P:   Regular diet. Nutrition consult.  HEMATOLOGIC A:   Macrocytosis - c/w ETOH abuse. Mild anemia. VTE Prophylaxis. P:  Transfuse per usual guidelines. SCD's / Heparin. CBC in AM.  INFECTIOUS A:   No indication for infection - has order for doxycycline, ? Indication. P:   Consider d/c doxy (no fever/leukocytosis or clinical suspicion for infection).  ENDOCRINE A:   No known issues. P:   Monitor glucose on BMP.   Family updated: None.  Interdisciplinary Family Meeting v Palliative Care Meeting:  Due by: 8/24.   Rutherford Guys, [place] [facility] Pager: [phone]  or [phone] 08/08/2015, 9:23 PM

## 2015-08-08 NOTE — Progress Notes (Signed)
[place] TEAM 1 - Stepdown/ICU TEAM Progress Note  Rhonda Mitchell ZOX:[id number] DOB: 1960/12/29 DOA: 08/03/2015 PCP: [name], MD  Admit HPI / Brief Narrative: 54 y.o. WF PMHx alcohol abuse,tobacco abuse and COPD non-oxygen dependent, hepatitis C.  Comes in for shortness of breath that started several days prior to admission. The patient is sleepy and cannot provide a history so most of the history is obtained from the chart. As per chart the patient had been short of breath last several days. She denies any cough or fever.  In the ED: She was found to be hypoxic and was placed on a nonrebreather and her oxygen saturations came up, she was given an hour-long treatment of albuterol but she was still desatted into the 70s. Chest x-ray was done that shows no infiltrates, her LFTs are mildly elevated, she was also found with an alcohol level of 436 she was started on Ativan protocol and she is only responsive to pain.   HPI/Subjective: 8/18 A/O 2 (does not know where, why) paged by RN  [place] patient very combative this morning extremely confused RASS score +2 to +4, cachectic, tremors, appears much older than stated age. States has been smoking 1/2 PPD 40+ years ADDENDUM; even with schedule Ativan, and using non-ICU CIWA patient is extremely agitated and miserable. Patient now also having hallucinations.  Assessment/Plan: Acute respiratory failure with hypoxia due to COPD exacerbation: - Symbicort 80-4 0.5 BID -Spiriva daily - Titrate O2 to maintain SPO2 88 -93%  -Continue antibiotics for 7 day course  Alcohol dependence/ Alcohol withdrawal -On admission alcohol 436 -UDS pending -8/18 RASS score+ 2 to +4 , start Ativan 3 mg q4hr -Continue on CIWA protocol, Bedside sitter -Restraints when appropriate ADDENDUM; even on above treatment patient continues to have RASS score+ 2 to +4. -Spoke with Dr. Hosie Poisson (PCCM) and he okayed moving patient to ICU and starting on  Precedex.  Tobacco abuse -Continue Nicoderm patch  Hyperglycemia - 8/14 Hemoglobin A1c= 5.6  -Sensitive SSI   Hyperkalemia -Resolved monitor closely  Hypomagnesemia -Magnesium IV 3 gm 1  Protein-calorie malnutrition, severe - Consult nutrition once patient is awake, started on ensure 3 times a day as the patient is able to communicate.  Hyponatremia - Iatrogenic DC D5-0.45% saline  -Start D5-0.9% saline 75 ml/hr  Elevated LFTs: -Likely due to alcohol abuse - improving   Macrocytic anemia: -Anemia panel unrevealing   Infectious -HIV negative -RPR negative -Acute hepatitis panel positive hepatitis C virus antibody -HCV Quant viral load = 285,000 -Will need hepatitis B vaccination      Code Status: FULL Family Communication: no family present at time of exam Disposition Plan: Resolution alcohol detoxification    Consultants: Phone consult Dr. Hosie Poisson (PCCM)  Procedure/Significant Events:    Culture   Antibiotics: Doxycycline 8/14>>   DVT prophylaxis: Subcutaneous heparin   Devices    LINES / TUBES:      Continuous Infusions: . dextrose 5 % and 0.9% NaCl 50 mL/hr at 08/08/15 0542    Objective: VITAL SIGNS: Temp: 98.6 F (37 C) (08/18 0745) Temp Source: Oral (08/18 0745) BP: 149/75 mmHg (08/18 0745) Pulse Rate: 81 (08/18 0745) SPO2; FIO2:   Intake/Output Summary (Last 24 hours) at 08/08/15 0810 Last data filed at 08/08/15 0600  Gross per 24 hour  Intake 2064.58 ml  Output    475 ml  Net 1589.58 ml     Exam: General: A/O 2(does not know where, why), patient cachectic, tremors, appears much older than stated age,  hallucinating, No acute respiratory distress Eyes: Negative headache, eye pain, scleral hemorrhage ENT: Negative Runny nose, negative ear pain, negative gingival bleeding, Neck:  Negative scars, masses, torticollis, lymphadenopathy, JVD Lungs: Clear to auscultation bilaterally without wheezes or  crackles Cardiovascular: Tachycardic, Regular rhythm without murmur gallop or rub normal S1 and S2 Abdomen:negative abdominal pain, negative dysphagia, nondistended, positive soft, bowel sounds, no rebound, no ascites, no appreciable mass Extremities: No significant cyanosis, clubbing, or edema bilateral lower extremities Psychiatric:  Unable to assess  Neurologic:  Unable to fully assess, patient with general body tremors, patient does follow commands though becomes confused easily and requires redirection    Data Reviewed: Basic Metabolic Panel:  Recent Labs Lab 08/04/15 0240 08/04/15 1107 08/06/15 0345 08/06/15 2020 08/06/15 2234 08/07/15 0245 08/08/15 0315  NA 141 141 125* 131*  --  136 136  K 3.9 4.8 6.4* 4.3  --  4.3 3.9  CL 105 104 93* 94*  --  101 100*  CO2 20* 27 22 23   --  24 25  GLUCOSE 174* 84 113* 103*  --  118* 105*  BUN 6 10 <5* 9  --  9 7  CREATININE 0.77  0.77 0.84 0.55 0.71  --  0.64 0.62  CALCIUM 7.3* 7.6* 7.8* 8.4*  --  8.2* 8.9  MG 2.2 1.8  --   --  1.6* 1.7 1.4*   Liver Function Tests:  Recent Labs Lab 08/04/15 1107 08/06/15 0345 08/06/15 2020 08/07/15 0245 08/08/15 0315  AST 147* 189* 272* 249* 278*  ALT 58* 49 73* 71* 87*  ALKPHOS 174* 188* 207* 184* 181*  BILITOT 0.5 1.3* 1.4* 0.9 1.3*  PROT 6.3* 7.1 7.9 6.5 6.8  ALBUMIN 2.7* 2.8* 3.1* 2.7* 2.9*   No results for input(s): LIPASE, AMYLASE in the last 168 hours.  Recent Labs Lab 08/03/15 1733  AMMONIA 39*   CBC:  Recent Labs Lab 08/04/15 0240 08/04/15 1107 08/06/15 0345 08/07/15 0245 08/08/15 0315  WBC 5.6  5.7 5.0 6.9 5.2 5.2  NEUTROABS  --   --   --  3.6 3.6  HGB 9.2*  9.4* 8.4* 11.5* 12.0 10.2*  HCT 28.4*  29.2* 26.2* 35.3* 36.1 31.1*  MCV 102.2*  102.1* 101.2* 101.4* 101.7* 101.0*  PLT 133*  125* 121* 160 168 173   Cardiac Enzymes: No results for input(s): CKTOTAL, CKMB, CKMBINDEX, TROPONINI in the last 168 hours. BNP (last 3 results)  Recent Labs   03/18/15 2109 08/03/15 1820  BNP 234.0* 55.1    ProBNP (last 3 results) No results for input(s): PROBNP in the last 8760 hours.  CBG:  Recent Labs Lab 08/06/15 2318 08/07/15 0402 08/07/15 0754 08/07/15 2014 08/07/15 2331  GLUCAP 105* 123* 101* 108* 103*    Recent Results (from the past 240 hour(s))  MRSA PCR Screening     Status: None   Collection Time: 08/04/15  2:00 AM  Result Value Ref Range Status   MRSA by PCR NEGATIVE NEGATIVE Final    Comment:        The GeneXpert MRSA Assay (FDA approved for NASAL specimens only), is one component of a comprehensive MRSA colonization surveillance program. It is not intended to diagnose MRSA infection nor to guide or monitor treatment for MRSA infections.      Studies:  Recent x-ray studies have been reviewed in detail by the Attending Physician  Scheduled Meds:  Scheduled Meds: . antiseptic oral rinse  7 mL Mouth Rinse q12n4p  . budesonide-formoterol  2 puff  Inhalation BID  . chlorhexidine  15 mL Mouth Rinse BID  . doxycycline (VIBRAMYCIN) IV  100 mg Intravenous Q12H  . DULoxetine  60 mg Oral Daily  . folic acid  1 mg Oral Daily  . heparin  5,000 Units Subcutaneous 3 times per day  . insulin aspart  0-9 Units Subcutaneous TID WC  . nicotine  21 mg Transdermal Daily  . sodium chloride  3 mL Intravenous Q12H  . thiamine  100 mg Oral Daily  . tiotropium  18 mcg Inhalation Daily    Time spent on care of this patient: 40 mins   WOODS, Roselind Messier , MD  [facility] Office  [phone] Pager [phone]  On-Call/Text Page:      [email or web address]      password [email or web address]  If 7PM-7AM, please contact night-coverage [email or web address] Password [email or web address] 08/08/2015, 8:10 AM   LOS: 5 days   Care during the described time interval was provided by me .  I have reviewed this patient's available data, including medical history, events of note, physical examination, and all test results as part of my evaluation. I have personally  reviewed and interpreted all radiology studies.   Carolyne Littles, MD [phone] Pager

## 2015-08-08 NOTE — Progress Notes (Signed)
At 10 Am Pt confused, thrashing in bed, and pulling at restraints enough to redden skin of elbows. Foam dsg in place to protect. Pt is disoriented and has received  of Ativan nearly every hour over night shift.  Notified attending of findings. New orders given. Will continue to monitor.   Day shift RN has provided pt with  Ativan every hour of shift without relief of symptoms.  Pt continues to pull self up trying to get out of bed even with restraint use.  Concern for skin damage at this time-will monitor.   Will notify attending and continue to monitor for further changes.

## 2015-08-09 LAB — COMPREHENSIVE METABOLIC PANEL
ALBUMIN: 2.6 g/dL — AB (ref 3.5–5.0)
ALK PHOS: 158 U/L — AB (ref 38–126)
ALT: 64 U/L — AB (ref 14–54)
ANION GAP: 8 (ref 5–15)
AST: 115 U/L — ABNORMAL HIGH (ref 15–41)
BUN: <5 mg/dL — ABNORMAL LOW (ref 6–20)
CHLORIDE: 103 mmol/L (ref 101–111)
CO2: 28 mmol/L (ref 22–32)
Calcium: 8.4 mg/dL — ABNORMAL LOW (ref 8.9–10.3)
Creatinine, Ser: 0.52 mg/dL (ref 0.44–1.00)
GFR calc non Af Amer: >60 mL/min (ref >60)
GLUCOSE: 106 mg/dL — AB (ref 65–99)
Potassium: 2.9 mmol/L — ABNORMAL LOW (ref 3.5–5.1)
SODIUM: 139 mmol/L (ref 135–145)
Total Bilirubin: 1.1 mg/dL (ref 0.3–1.2)
Total Protein: 6.5 g/dL (ref 6.5–8.1)

## 2015-08-09 LAB — CBC WITH DIFFERENTIAL/PLATELET
Basophils Absolute: 0 10*3/uL (ref 0.0–0.1)
Basophils Relative: 0 % (ref 0–1)
Eosinophils Absolute: 0.2 10*3/uL (ref 0.0–0.7)
Eosinophils Relative: 4 % (ref 0–5)
HEMATOCRIT: 30.6 % — AB (ref 36.0–46.0)
HEMOGLOBIN: 10 g/dL — AB (ref 12.0–15.0)
LYMPHS ABS: 1.1 10*3/uL (ref 0.7–4.0)
Lymphocytes Relative: 18 % (ref 12–46)
MCH: 33.1 pg (ref 26.0–34.0)
MCHC: 32.7 g/dL (ref 30.0–36.0)
MCV: 101.3 fL — AB (ref 78.0–100.0)
MONO ABS: 1 10*3/uL (ref 0.1–1.0)
MONOS PCT: 17 % — AB (ref 3–12)
NEUTROS ABS: 3.6 10*3/uL (ref 1.7–7.7)
NEUTROS PCT: 61 % (ref 43–77)
Platelets: 202 10*3/uL (ref 150–400)
RBC: 3.02 MIL/uL — ABNORMAL LOW (ref 3.87–5.11)
RDW: 20 % — AB (ref 11.5–15.5)
WBC: 5.9 10*3/uL (ref 4.0–10.5)

## 2015-08-09 LAB — MAGNESIUM: Magnesium: 1.9 mg/dL (ref 1.7–2.4)

## 2015-08-09 LAB — PHOSPHORUS: Phosphorus: 4.9 mg/dL — ABNORMAL HIGH (ref 2.5–4.6)

## 2015-08-09 MED ORDER — LORAZEPAM 2 MG/ML IJ SOLN
1.0000 mg | INTRAMUSCULAR | Status: DC | PRN
  Administered 2015-08-10: 2 mg via INTRAVENOUS
  Administered 2015-08-11 (×2): 4 mg via INTRAVENOUS
  Administered 2015-08-11 (×2): 2 mg via INTRAVENOUS
  Administered 2015-08-11 – 2015-08-12 (×2): 4 mg via INTRAVENOUS
  Administered 2015-08-12: 2 mg via INTRAVENOUS
  Filled 2015-08-09 (×3): qty 1
  Filled 2015-08-09 (×2): qty 2
  Filled 2015-08-09: qty 1
  Filled 2015-08-09: qty 2
  Filled 2015-08-09: qty 1
  Filled 2015-08-09 (×3): qty 2

## 2015-08-09 MED ORDER — LORAZEPAM 1 MG PO TABS
1.0000 mg | ORAL_TABLET | ORAL | Status: DC | PRN
  Administered 2015-08-09: 4 mg via ORAL
  Administered 2015-08-10 – 2015-08-12 (×2): 1 mg via ORAL
  Filled 2015-08-09 (×2): qty 1
  Filled 2015-08-09: qty 4

## 2015-08-09 NOTE — Consult Note (Signed)
PULMONARY / CRITICAL CARE MEDICINE   Name: Rhonda Mitchell MRN: [id number] DOB: [birth year]    ADMISSION DATE:  08/03/2015 CONSULTATION DATE:  08/08/2015  REFERRING MD :  Joseph Art  CHIEF COMPLAINT:  ETOH withdrawal  INITIAL PRESENTATION:   54 yo female smoker presented with dyspnea.  Found to have elevated LFT's from ETOH (level 436 on admit).  Transferred to ICU for management of alcohol withdrawal.  Hx of COPD, Hep C, GERD.  STUDIES:  none  SIGNIFICANT EVENTS: 8/13 - admitted 8/18 - transferred to ICU  SUBJECTIVE:  Somnolent  VITAL SIGNS: Temp:  [97.4 F (36.3 C)-98.9 F (37.2 C)] 97.4 F (36.3 C) (08/19 0417) Pulse Rate:  [39-95] 74 (08/19 1100) Resp:  [12-39] 20 (08/19 1100) BP: (81-168)/(59-131) 114/61 mmHg (08/19 1100) SpO2:  [90 %-100 %] 94 % (08/19 1100) INTAKE / OUTPUT: Intake/Output      08/18 0701 - 08/19 0700 08/19 0701 - 08/20 0700   P.O. 200 100   I.V. (mL/kg) 1130 (18.4) 303 (4.9)   IV Piggyback     Total Intake(mL/kg) 1330 (21.7) 403 (6.6)   Urine (mL/kg/hr)     Stool     Total Output       Net +1330 +403        Urine Occurrence 5 x 1 x     PHYSICAL EXAMINATION: General: ill appearing Neuro: somnolent HEENT: no sinus tenderness Cardiovascular: regular, no murmur Lungs: no wheeze Abdomen: soft, non tender Musculoskeletal: No edema Skin: no rashes  LABS:  CBC  Recent Labs Lab 08/07/15 0245 08/08/15 0315 08/09/15 0712  WBC 5.2 5.2 5.9  HGB 12.0 10.2* 10.0*  HCT 36.1 31.1* 30.6*  PLT 168 173 202   BMET  Recent Labs Lab 08/07/15 0245 08/08/15 0315 08/09/15 0712  NA 136 136 139  K 4.3 3.9 2.9*  CL 101 100* 103  CO2 24 25 28   BUN 9 7 <5*  CREATININE 0.64 0.62 0.52  GLUCOSE 118* 105* 106*   Electrolytes  Recent Labs Lab 08/07/15 0245 08/08/15 0315 08/09/15 0712  CALCIUM 8.2* 8.9 8.4*  MG 1.7 1.4* 1.9  PHOS  --   --  4.9*   ABG  Recent Labs Lab 08/03/15 2138  PHART 7.319*  PCO2ART 52.8*  PO2ART 78.0*   Liver  Enzymes  Recent Labs Lab 08/07/15 0245 08/08/15 0315 08/09/15 0712  AST 249* 278* 115*  ALT 71* 87* 64*  ALKPHOS 184* 181* 158*  BILITOT 0.9 1.3* 1.1  ALBUMIN 2.7* 2.9* 2.6*   Glucose  Recent Labs Lab 08/07/15 2014 08/07/15 2331 08/08/15 0743 08/08/15 1207 08/08/15 1536 08/08/15 2214  GLUCAP 108* 103* 100* 110* 111* 109*    Imaging No results found.   ASSESSMENT / PLAN:  NEUROLOGIC A:   Alcohol withdrawal with delirium. Hx anxiety, insomnia. P:   CIWA q4h with prn ativan If no improvement, then add precedex Continue duloxetine, thiamine, folate, multivitamin  PULMONARY A: Tobacco abuse with reported hx of COPD. P:   Symbicort, spiriva Nicotine patch  CARDIOVASCULAR A:  Hypotension from hypovolemia and sedation. P:  Continue IV fluids  RENAL A:   Hypokalemia, hypomagnesia. P:   Replace electrolytes as needed  GASTROINTESTINAL A:   Elevated LFT's in setting of ETOH. Hx of Hep C, GERD. P:   Regular diet F/u LFT's  HEMATOLOGIC A:   Macrocytic anemia in setting of ETOH. P:  F/u CBC SQ heparin  INFECTIOUS A:   No evidence for infection. P:   Monitor clinically  ENDOCRINE  A:   No acute issues. P:   Monitor glucose on BMP  CC time 35 minutes.  Coralyn Helling, MD [facility] Pulmonary/Critical Care 08/09/2015, 12:12 PM Pager:  [phone] After 3pm call: [phone]

## 2015-08-09 NOTE — Progress Notes (Signed)
eLink Physician-Brief Progress Note Patient Name: Rhonda Mitchell DOB: Apr 23, 1961 MRN: [id number]   Date of Service  08/09/2015  HPI/Events of Note  RN notified patient c/o pain in back, shoulders, elbow, etc. BP normal & HR 74. Patient comfortable on camera check despite pain "8/10".  eICU Interventions  Close monitoring. No narcotics at this time.     Intervention Category Intermediate Interventions: Pain - evaluation and management  Lawanda Cousins 08/09/2015, 9:54 PM

## 2015-08-10 DIAGNOSIS — J9601 Acute respiratory failure with hypoxia: Secondary | ICD-10-CM

## 2015-08-10 DIAGNOSIS — K623 Rectal prolapse: Secondary | ICD-10-CM

## 2015-08-10 LAB — COMPREHENSIVE METABOLIC PANEL
ALBUMIN: 2.4 g/dL — AB (ref 3.5–5.0)
ALK PHOS: 148 U/L — AB (ref 38–126)
ALT: 59 U/L — ABNORMAL HIGH (ref 14–54)
ANION GAP: 11 (ref 5–15)
AST: 109 U/L — ABNORMAL HIGH (ref 15–41)
BUN: 6 mg/dL (ref 6–20)
CHLORIDE: 106 mmol/L (ref 101–111)
CO2: 22 mmol/L (ref 22–32)
Calcium: 8.1 mg/dL — ABNORMAL LOW (ref 8.9–10.3)
Creatinine, Ser: 0.57 mg/dL (ref 0.44–1.00)
GFR calc Af Amer: >60 mL/min (ref >60)
GFR calc non Af Amer: >60 mL/min (ref >60)
GLUCOSE: 99 mg/dL (ref 65–99)
POTASSIUM: 4.4 mmol/L (ref 3.5–5.1)
Sodium: 139 mmol/L (ref 135–145)
Total Bilirubin: 1 mg/dL (ref 0.3–1.2)
Total Protein: 5.9 g/dL — ABNORMAL LOW (ref 6.5–8.1)

## 2015-08-10 LAB — CBC
HEMATOCRIT: 32.3 % — AB (ref 36.0–46.0)
HEMOGLOBIN: 10.4 g/dL — AB (ref 12.0–15.0)
MCH: 32.5 pg (ref 26.0–34.0)
MCHC: 32.2 g/dL (ref 30.0–36.0)
MCV: 100.9 fL — AB (ref 78.0–100.0)
Platelets: 212 10*3/uL (ref 150–400)
RBC: 3.2 MIL/uL — AB (ref 3.87–5.11)
RDW: 19.8 % — ABNORMAL HIGH (ref 11.5–15.5)
WBC: 6.4 10*3/uL (ref 4.0–10.5)

## 2015-08-10 LAB — MAGNESIUM: Magnesium: 1.7 mg/dL (ref 1.7–2.4)

## 2015-08-10 LAB — PHOSPHORUS: PHOSPHORUS: 4.5 mg/dL (ref 2.5–4.6)

## 2015-08-10 MED ORDER — CYCLOBENZAPRINE HCL 10 MG PO TABS
10.0000 mg | ORAL_TABLET | Freq: Once | ORAL | Status: AC
  Administered 2015-08-10: 10 mg via ORAL
  Filled 2015-08-10: qty 1

## 2015-08-10 MED ORDER — HYDROCODONE-ACETAMINOPHEN 5-325 MG PO TABS
1.0000 | ORAL_TABLET | Freq: Once | ORAL | Status: AC
  Administered 2015-08-10: 1 via ORAL
  Filled 2015-08-10: qty 1

## 2015-08-10 MED ORDER — MAGNESIUM SULFATE 2 GM/50ML IV SOLN
2.0000 g | Freq: Once | INTRAVENOUS | Status: AC
  Administered 2015-08-10: 2 g via INTRAVENOUS
  Filled 2015-08-10: qty 50

## 2015-08-10 MED ORDER — KETOROLAC TROMETHAMINE 30 MG/ML IJ SOLN
30.0000 mg | Freq: Once | INTRAMUSCULAR | Status: AC
  Administered 2015-08-10: 30 mg via INTRAVENOUS
  Filled 2015-08-10: qty 1

## 2015-08-10 NOTE — Progress Notes (Signed)
PULMONARY / CRITICAL CARE MEDICINE   Name: Rhonda Mitchell MRN: [id number] DOB: June 16, 1961    ADMISSION DATE:  08/03/2015 CONSULTATION DATE:  08/08/2015  REFERRING MD :  Joseph Art  CHIEF COMPLAINT:  ETOH withdrawal  INITIAL PRESENTATION:   54 yo female smoker presented with dyspnea.  Found to have elevated LFT's from ETOH (level 436 on admit).  Transferred to ICU for management of alcohol withdrawal.  Hx of COPD, Hep C, GERD.  STUDIES:  none  SIGNIFICANT EVENTS: 8/13 - admitted 8/18 - transferred to ICU  SUBJECTIVE:  Wakes up and talks appropriately.  No ativan since 8pm last night , calm without agitation  Poor intake   VITAL SIGNS: Temp:  [97.2 F (36.2 C)-98 F (36.7 C)] 97.6 F (36.4 C) (08/20 1150) Pulse Rate:  [62-83] 72 (08/20 1100) Resp:  [20-31] 20 (08/20 1100) BP: (87-166)/(46-105) 135/79 mmHg (08/20 1100) SpO2:  [90 %-100 %] 95 % (08/20 1120) INTAKE / OUTPUT: Intake/Output      08/19 0701 - 08/20 0700 08/20 0701 - 08/21 0700   P.O. 100    I.V. (mL/kg) 1803 (29.4) 300 (4.9)   Total Intake(mL/kg) 1903 (31) 300 (4.9)   Net +1903 +300        Urine Occurrence 5 x      PHYSICAL EXAMINATION: General: ill appearing Neuro: sleeping but wakes up and talks appropriately  HEENT: no sinus tenderness Cardiovascular: regular, no murmur Lungs: no wheeze Abdomen: soft, non tender Musculoskeletal: No edema Skin: no rashes  LABS:  CBC  Recent Labs Lab 08/08/15 0315 08/09/15 0712 08/10/15 0516  WBC 5.2 5.9 6.4  HGB 10.2* 10.0* 10.4*  HCT 31.1* 30.6* 32.3*  PLT 173 202 212   BMET  Recent Labs Lab 08/08/15 0315 08/09/15 0712 08/10/15 0516  NA 136 139 139  K 3.9 2.9* 4.4  CL 100* 103 106  CO2 BUN 7 <5* 6  CREATININE 0.62 0.52 0.57  GLUCOSE 105* 106* 99   Electrolytes  Recent Labs Lab 08/08/15 0315 08/09/15 0712 08/10/15 0516  CALCIUM 8.9 8.4* 8.1*  MG 1.4* 1.9 1.7  PHOS  --  4.9* 4.5   ABG  Recent Labs Lab 08/03/15 2138   PHART 7.319*  PCO2ART 52.8*  PO2ART 78.0*   Liver Enzymes  Recent Labs Lab 08/08/15 0315 08/09/15 0712 08/10/15 0516  AST 278* 115* 109*  ALT 87* 64* 59*  ALKPHOS 181* 158* 148*  BILITOT 1.3* 1.1 1.0  ALBUMIN 2.9* 2.6* 2.4*   Glucose  Recent Labs Lab 08/07/15 2014 08/07/15 2331 08/08/15 0743 08/08/15 1207 08/08/15 1536 08/08/15 2214  GLUCAP 108* 103* 100* 110* 111* 109*    Imaging No results found.   ASSESSMENT / PLAN:  NEUROLOGIC A:   Alcohol withdrawal with delirium. Hx anxiety, insomnia. P:   CIWA q4h with prn ativan If no improvement, then add precedex Continue duloxetine, thiamine, folate, multivitamin  PULMONARY A: Tobacco abuse with reported hx of COPD. P:   Symbicort, spiriva Nicotine patch  CARDIOVASCULAR A:  Hypotension from hypovolemia and sedation.>resolved  P:  Continue IV fluids  RENAL A:   Hypokalemia, hypomagnesia. P:   Replace electrolytes as needed  GASTROINTESTINAL A:   Elevated LFT's in setting of ETOH. Hx of Hep C, GERD. P:   Regular diet F/u LFT's  HEMATOLOGIC A:   Macrocytic anemia in setting of ETOH. P:  F/u CBC SQ heparin  INFECTIOUS A:   No evidence for infection. P:   Monitor clinically  ENDOCRINE A:  No acute issues. P:   Monitor glucose on BMP    Tammy Parrett NP-C  [facility]  [phone]

## 2015-08-11 LAB — COMPREHENSIVE METABOLIC PANEL
ALBUMIN: 2.4 g/dL — AB (ref 3.5–5.0)
ALT: 51 U/L (ref 14–54)
AST: 87 U/L — AB (ref 15–41)
Alkaline Phosphatase: 148 U/L — ABNORMAL HIGH (ref 38–126)
Anion gap: 10 (ref 5–15)
BILIRUBIN TOTAL: 0.5 mg/dL (ref 0.3–1.2)
BUN: 5 mg/dL — AB (ref 6–20)
CO2: 25 mmol/L (ref 22–32)
CREATININE: 0.62 mg/dL (ref 0.44–1.00)
Calcium: 8.4 mg/dL — ABNORMAL LOW (ref 8.9–10.3)
Chloride: 106 mmol/L (ref 101–111)
GFR calc Af Amer: >60 mL/min (ref >60)
GLUCOSE: 120 mg/dL — AB (ref 65–99)
Potassium: 2.9 mmol/L — ABNORMAL LOW (ref 3.5–5.1)
Sodium: 141 mmol/L (ref 135–145)
TOTAL PROTEIN: 6 g/dL — AB (ref 6.5–8.1)

## 2015-08-11 LAB — MAGNESIUM: MAGNESIUM: 1.7 mg/dL (ref 1.7–2.4)

## 2015-08-11 MED ORDER — ACETAMINOPHEN 325 MG PO TABS
650.0000 mg | ORAL_TABLET | Freq: Four times a day (QID) | ORAL | Status: DC | PRN
  Administered 2015-08-11 – 2015-08-25 (×9): 650 mg via ORAL
  Filled 2015-08-11 (×12): qty 2

## 2015-08-11 MED ORDER — CYCLOBENZAPRINE HCL 10 MG PO TABS
10.0000 mg | ORAL_TABLET | Freq: Once | ORAL | Status: AC
  Administered 2015-08-11: 10 mg via ORAL
  Filled 2015-08-11: qty 1

## 2015-08-11 MED ORDER — HYDROCODONE-ACETAMINOPHEN 5-325 MG PO TABS
1.0000 | ORAL_TABLET | Freq: Once | ORAL | Status: AC
  Administered 2015-08-11: 1 via ORAL
  Filled 2015-08-11: qty 1

## 2015-08-11 MED ORDER — KETOROLAC TROMETHAMINE 30 MG/ML IJ SOLN
30.0000 mg | Freq: Once | INTRAMUSCULAR | Status: AC
  Administered 2015-08-11: 30 mg via INTRAVENOUS
  Filled 2015-08-11: qty 1

## 2015-08-11 NOTE — Progress Notes (Signed)
PROGRESS NOTE  Rhonda Mitchell ZOX:[id number] DOB: 05/05/1961 DOA: 08/03/2015 PCP: [name], MD  Brief history 54 year old female with a history of alcohol abuse (1 pint vodka daily), tobacco abuse, COPD (non-oxygen dependent), hepatitis C presented with shortness of breath for several days prior to admission. The patient was encephalopathic/obtunded and unable to provide history in the emergency department. She was found to be hypoxic and placed on nonrebreather. She continued to have desaturation into the 70s despite albuterol nebulizers. Alcohol level was 436 at the time of admission. It was thought that her respiratory failure was likely due to respiratory depression from alcohol intoxication. On the morning of 08/07/2015, the patient became increasingly agitated requiring additional doses of Ativan. On 08/08/2015, critical care medicine was consulted due to continued agitation and hallucinations. Fortunately, the patient improved without the use of Precedex. She was transferred back to the medical floor on 08/10/2015. Assessment/Plan: Acute respiratory failure with hypoxia due to hypoventilation and respiratory depression from alcoholic intoxication: - Symbicort 80-4 0.5 BID -Spiriva daily - Titrate O2 to maintain SPO2 88 -93%  -presently stable on RA  93-97%  Alcohol dependence/ Alcohol withdrawal -On admission alcohol 436 -UDS--had benzodiazepines and opiates -8/18 RASS score+ 2 to +4 --transferred to ICU -Continue on CIWA protocol -Restraints when appropriate -Fortunately did not require Precedex, transferred back to medical floor 08/10/2015 -08/11/2015--more alert today without any agitation Tobacco abuse -Continue Nicoderm patch -still smoking  Hyperglycemia - 8/14 Hemoglobin A1c= 5.6  -Sensitive SSI   Hypomagnesemia -Magnesium IV 3 gm 1  Protein-calorie malnutrition, severe - Consult nutrition once patient is awake, started on ensure 3 times a day as the  patient is able to communicate.  Hyponatremia - Iatrogenic DC D5-0.45% saline  -Start D5-0.9% saline 75 ml/hr-->improved  Elevated LFTs: -Likely due to alcohol abuse - improving   Macrocytic anemia: -Anemia panel unrevealing  -Likely due to continued alcohol use -B12 and folate are WNL  Infectious/Chronic Hep C without Coma -HIV negative -RPR negative -Acute hepatitis panel positive hepatitis C virus antibody -HCV Quant viral load = 285,000 -Will need hepatitis B vaccination   Family Communication:   Pt at beside Disposition Plan:   Home 8/22 if stable       Procedures/Studies: Ct Head Wo Contrast  07/27/2015   CLINICAL DATA:  Acute onset of syncope. Loss of consciousness. Multiple recent falls. Initial encounter.  EXAM: CT HEAD WITHOUT CONTRAST  TECHNIQUE: Contiguous axial images were obtained from the base of the skull through the vertex without intravenous contrast.  COMPARISON:  CT of the head performed 03/15/2015  FINDINGS: There is no evidence of acute infarction, mass lesion, or intra- or extra-axial hemorrhage on CT.  Prominence of the ventricles and sulci reflects mild to moderate cortical volume loss. Cerebellar atrophy is noted. Scattered periventricular white matter change likely reflects small vessel ischemic microangiopathy.  The brainstem and fourth ventricle are within normal limits. The basal ganglia are unremarkable in appearance. The cerebral hemispheres demonstrate grossly normal gray-white differentiation. No mass effect or midline shift is seen.  There is no evidence of fracture; visualized osseous structures are unremarkable in appearance. The orbits are within normal limits. The paranasal sinuses and mastoid air cells are well-aerated. No significant soft tissue abnormalities are seen.  IMPRESSION: 1. No evidence of traumatic intracranial injury or fracture. 2. Mild to moderate cortical volume loss and scattered small vessel ischemic microangiopathy.    Electronically Signed   By: Beryle Beams.D.  On: 07/27/2015 17:41   Dg Abd Acute W/chest  08/03/2015   CLINICAL DATA:  Shortness of breath.  COPD.  Rectal prolapse.  EXAM: DG ABDOMEN ACUTE W/ 1V CHEST  COMPARISON:  One-view chest x-ray 03/18/2015.  FINDINGS: The heart is mildly enlarged. Mild interstitial coarsening is slightly increased.  Gas and stool are present throughout the colon. The bowel gas pattern is otherwise normal. There is no free air or obstruction. The axial skeleton is within normal limits.  IMPRESSION: 1. Mild increased interstitial coarsening could represent mild edema. 2. Negative two view abdomen.   Electronically Signed   By: Marin Roberts M.D.   On: 08/03/2015 17:39         Subjective: Patient denies fevers, chills, headache, chest pain, dyspnea, nausea, vomiting, diarrhea, abdominal pain, dysuria, hematuria   Objective: Filed Vitals:   08/10/15 1901 08/10/15 2113 08/10/15 2123 08/11/15 0520  BP: 142/81 147/84  139/76  Pulse: 75 76  78  Temp: 97.5 F (36.4 C) 98.2 F (36.8 C)  97.6 F (36.4 C)  TempSrc: Oral Oral  Oral  Resp: 18 17  18   Height:      Weight:      SpO2: 96% 98% 97% 93%    Intake/Output Summary (Last 24 hours) at 08/11/15 0865 Last data filed at 08/11/15 7846  Gross per 24 hour  Intake   2278 ml  Output      0 ml  Net   2278 ml   Weight change:  Exam:   General:  Pt is alert, follows commands appropriately, not in acute distress  HEENT: No icterus, No thrush, No neck mass, [place]/AT  Cardiovascular: RRR, S1/S2, no rubs, no gallops  Respiratory: Bibasilar crackles. No wheezing. Good air movement.  Abdomen: Soft/+BS, non tender, non distended, no guarding  Extremities: No edema, No lymphangitis, No petechiae, No rashes, no synovitis  Data Reviewed: Basic Metabolic Panel:  Recent Labs Lab 08/06/15 2020 08/06/15 2234 08/07/15 0245 08/08/15 0315 08/09/15 0712 08/10/15 0516  NA 131*  --  136 136 139 139  K 4.3   --  4.3 3.9 2.9* 4.4  CL 94*  --  101 100* 103 106  CO2 23  --  24 25 28 22   GLUCOSE 103*  --  118* 105* 106* 99  BUN 9  --  9 7 <5* 6  CREATININE 0.71  --  0.64 0.62 0.52 0.57  CALCIUM 8.4*  --  8.2* 8.9 8.4* 8.1*  MG  --  1.6* 1.7 1.4* 1.9 1.7  PHOS  --   --   --   --  4.9* 4.5   Liver Function Tests:  Recent Labs Lab 08/06/15 2020 08/07/15 0245 08/08/15 0315 08/09/15 0712 08/10/15 0516  AST 272* 249* 278* 115* 109*  ALT 73* 71* 87* 64* 59*  ALKPHOS 207* 184* 181* 158* 148*  BILITOT 1.4* 0.9 1.3* 1.1 1.0  PROT 7.9 6.5 6.8 6.5 5.9*  ALBUMIN 3.1* 2.7* 2.9* 2.6* 2.4*   No results for input(s): LIPASE, AMYLASE in the last 168 hours.  Recent Labs Lab 08/08/15 1149  AMMONIA 37*   CBC:  Recent Labs Lab 08/06/15 0345 08/07/15 0245 08/08/15 0315 08/09/15 0712 08/10/15 0516  WBC 6.9 5.2 5.2 5.9 6.4  NEUTROABS  --  3.6 3.6 3.6  --   HGB 11.5* 12.0 10.2* 10.0* 10.4*  HCT 35.3* 36.1 31.1* 30.6* 32.3*  MCV 101.4* 101.7* 101.0* 101.3* 100.9*  PLT 160 168 173 202 212   Cardiac Enzymes:  No results for input(s): CKTOTAL, CKMB, CKMBINDEX, TROPONINI in the last 168 hours. BNP: Invalid input(s): POCBNP CBG:  Recent Labs Lab 08/07/15 2331 08/08/15 0743 08/08/15 1207 08/08/15 1536 08/08/15 2214  GLUCAP 103* 100* 110* 111* 109*    Recent Results (from the past 240 hour(s))  MRSA PCR Screening     Status: None   Collection Time: 08/04/15  2:00 AM  Result Value Ref Range Status   MRSA by PCR NEGATIVE NEGATIVE Final    Comment:        The GeneXpert MRSA Assay (FDA approved for NASAL specimens only), is one component of a comprehensive MRSA colonization surveillance program. It is not intended to diagnose MRSA infection nor to guide or monitor treatment for MRSA infections.      Scheduled Meds: . antiseptic oral rinse  7 mL Mouth Rinse q12n4p  . budesonide-formoterol  2 puff Inhalation BID  . chlorhexidine  15 mL Mouth Rinse BID  . DULoxetine  60 mg Oral  Daily  . folic acid  1 mg Oral Daily  . heparin  5,000 Units Subcutaneous 3 times per day  . multivitamin with minerals  1 tablet Oral Daily  . nicotine  21 mg Transdermal Daily  . sodium chloride  3 mL Intravenous Q12H  . thiamine  100 mg Oral Daily  . tiotropium  18 mcg Inhalation Daily   Continuous Infusions: . dextrose 5 % and 0.9% NaCl 75 mL/hr at 08/11/15 0342     Keaun Schnabel, DO  [facility] Pager [phone]  If 7PM-7AM, please contact night-coverage [email or web address] Password [email or web address] 08/11/2015, 7:22 AM   LOS: 8 days

## 2015-08-12 LAB — COMPREHENSIVE METABOLIC PANEL
ALT: 49 U/L (ref 14–54)
AST: 84 U/L — AB (ref 15–41)
Albumin: 2.5 g/dL — ABNORMAL LOW (ref 3.5–5.0)
Alkaline Phosphatase: 146 U/L — ABNORMAL HIGH (ref 38–126)
Anion gap: 9 (ref 5–15)
BILIRUBIN TOTAL: 0.5 mg/dL (ref 0.3–1.2)
CHLORIDE: 105 mmol/L (ref 101–111)
CO2: 25 mmol/L (ref 22–32)
CREATININE: 0.59 mg/dL (ref 0.44–1.00)
Calcium: 8.3 mg/dL — ABNORMAL LOW (ref 8.9–10.3)
Glucose, Bld: 113 mg/dL — ABNORMAL HIGH (ref 65–99)
Potassium: 3 mmol/L — ABNORMAL LOW (ref 3.5–5.1)
Sodium: 139 mmol/L (ref 135–145)
TOTAL PROTEIN: 6 g/dL — AB (ref 6.5–8.1)

## 2015-08-12 LAB — CBC
HCT: 28.5 % — ABNORMAL LOW (ref 36.0–46.0)
HEMOGLOBIN: 9.2 g/dL — AB (ref 12.0–15.0)
MCH: 32.1 pg (ref 26.0–34.0)
MCHC: 32.3 g/dL (ref 30.0–36.0)
MCV: 99.3 fL (ref 78.0–100.0)
PLATELETS: 248 10*3/uL (ref 150–400)
RBC: 2.87 MIL/uL — AB (ref 3.87–5.11)
RDW: 19.5 % — ABNORMAL HIGH (ref 11.5–15.5)
WBC: 4.1 10*3/uL (ref 4.0–10.5)

## 2015-08-12 MED ORDER — POTASSIUM CHLORIDE CRYS ER 20 MEQ PO TBCR
40.0000 meq | EXTENDED_RELEASE_TABLET | Freq: Once | ORAL | Status: AC
  Administered 2015-08-12: 40 meq via ORAL
  Filled 2015-08-12: qty 2

## 2015-08-12 MED ORDER — LORAZEPAM 2 MG/ML IJ SOLN
2.0000 mg | INTRAMUSCULAR | Status: DC | PRN
  Administered 2015-08-12 (×3): 4 mg via INTRAVENOUS
  Administered 2015-08-12: 2 mg via INTRAVENOUS
  Administered 2015-08-12 – 2015-08-13 (×4): 4 mg via INTRAVENOUS
  Administered 2015-08-17: 2 mg via INTRAVENOUS
  Administered 2015-08-17 (×2): 4 mg via INTRAVENOUS
  Administered 2015-08-17: 2 mg via INTRAVENOUS
  Administered 2015-08-17: 4 mg via INTRAVENOUS
  Administered 2015-08-17 – 2015-08-18 (×4): 2 mg via INTRAVENOUS
  Administered 2015-08-18 (×3): 4 mg via INTRAVENOUS
  Administered 2015-08-18: 2 mg via INTRAVENOUS
  Administered 2015-08-19 – 2015-08-22 (×14): 4 mg via INTRAVENOUS
  Administered 2015-08-22: 2 mg via INTRAVENOUS
  Administered 2015-08-22 – 2015-08-23 (×4): 4 mg via INTRAVENOUS
  Administered 2015-08-23: 2 mg via INTRAVENOUS
  Administered 2015-08-23 – 2015-08-25 (×9): 4 mg via INTRAVENOUS
  Filled 2015-08-12 (×5): qty 2
  Filled 2015-08-12: qty 1
  Filled 2015-08-12 (×5): qty 2
  Filled 2015-08-12: qty 1
  Filled 2015-08-12 (×2): qty 2
  Filled 2015-08-12: qty 1
  Filled 2015-08-12 (×4): qty 2
  Filled 2015-08-12: qty 1
  Filled 2015-08-12 (×2): qty 2
  Filled 2015-08-12: qty 1
  Filled 2015-08-12 (×10): qty 2
  Filled 2015-08-12: qty 1
  Filled 2015-08-12 (×2): qty 2
  Filled 2015-08-12: qty 1
  Filled 2015-08-12 (×10): qty 2
  Filled 2015-08-12: qty 1
  Filled 2015-08-12 (×3): qty 2
  Filled 2015-08-12 (×2): qty 1
  Filled 2015-08-12 (×3): qty 2

## 2015-08-12 MED ORDER — BOOST / RESOURCE BREEZE PO LIQD
1.0000 | Freq: Three times a day (TID) | ORAL | Status: DC
  Administered 2015-08-12 – 2015-08-21 (×18): 1 via ORAL

## 2015-08-12 MED ORDER — THIAMINE HCL 100 MG/ML IJ SOLN
500.0000 mg | Freq: Three times a day (TID) | INTRAVENOUS | Status: AC
  Administered 2015-08-12 – 2015-08-14 (×4): 500 mg via INTRAVENOUS
  Filled 2015-08-12 (×6): qty 5

## 2015-08-12 MED ORDER — LORAZEPAM 2 MG/ML IJ SOLN
2.0000 mg | Freq: Once | INTRAMUSCULAR | Status: AC
  Administered 2015-08-12: 2 mg via INTRAVENOUS
  Filled 2015-08-12: qty 1

## 2015-08-12 MED ORDER — POTASSIUM CHLORIDE 10 MEQ/100ML IV SOLN
10.0000 meq | Freq: Once | INTRAVENOUS | Status: AC
  Administered 2015-08-12: 10 meq via INTRAVENOUS
  Filled 2015-08-12: qty 100

## 2015-08-12 MED ORDER — LORAZEPAM 1 MG PO TABS
2.0000 mg | ORAL_TABLET | ORAL | Status: DC | PRN
  Administered 2015-08-13 – 2015-08-14 (×4): 4 mg via ORAL
  Administered 2015-08-15 (×3): 2 mg via ORAL
  Administered 2015-08-15: 4 mg via ORAL
  Administered 2015-08-16 (×5): 2 mg via ORAL
  Administered 2015-08-16 – 2015-08-28 (×27): 4 mg via ORAL
  Filled 2015-08-12: qty 4
  Filled 2015-08-12: qty 2
  Filled 2015-08-12 (×3): qty 4
  Filled 2015-08-12: qty 2
  Filled 2015-08-12: qty 4
  Filled 2015-08-12 (×2): qty 2
  Filled 2015-08-12 (×3): qty 4
  Filled 2015-08-12: qty 2
  Filled 2015-08-12 (×2): qty 4
  Filled 2015-08-12: qty 2
  Filled 2015-08-12 (×2): qty 4
  Filled 2015-08-12: qty 2
  Filled 2015-08-12 (×2): qty 4
  Filled 2015-08-12: qty 2
  Filled 2015-08-12 (×9): qty 4
  Filled 2015-08-12: qty 2
  Filled 2015-08-12: qty 4
  Filled 2015-08-12: qty 2
  Filled 2015-08-12 (×2): qty 4
  Filled 2015-08-12: qty 2
  Filled 2015-08-12 (×6): qty 4

## 2015-08-12 MED ORDER — HALOPERIDOL LACTATE 5 MG/ML IJ SOLN
5.0000 mg | Freq: Once | INTRAMUSCULAR | Status: AC
  Administered 2015-08-12: 5 mg via INTRAVENOUS
  Filled 2015-08-12: qty 1

## 2015-08-12 NOTE — Progress Notes (Signed)
Pt is noted to be very agitated and frequently tries to stand and ambulates unassissted, Ativan prn given with little effect, MD called and possey belt initiated. Mr. Lavena Bullion (significant others) informed.

## 2015-08-12 NOTE — Progress Notes (Signed)
Initial Nutrition Assessment  DOCUMENTATION CODES:   Not applicable  INTERVENTION:   Boost Breeze po TID, each supplement provides 250 kcal and 9 grams of protein  NUTRITION DIAGNOSIS:   Increased nutrient needs related to chronic illness as evidenced by estimated needs.  GOAL:   Patient will meet greater than or equal to 90% of their needs  MONITOR:   PO intake, Supplement acceptance, Labs, Weight trends, Skin, I & O's  REASON FOR ASSESSMENT:   Consult Assessment of nutrition requirement/status  ASSESSMENT:   Rhonda Mitchell is a 54 y.o. female past medical history of alcohol abuse ongoing tobacco abuse and COPD non-oxygen dependent at comes in for shortness of breath that started several days prior to admission. The patient is sleepy and cannot provide a history so most of the history is obtained from the chart. As per chart the patient had been short of breath last several days. She denies any cough or fever.  Pt admitted with COPD exacerbation and alcohol withdrawal.   Pt is currently on a regular diet (since 08/07/15). Intake has been variable; PO: 0-100%.   Pt was agitated at time of RD visit; attempted to get out of bed when attempting exam, so this RD was unable to complete full exam at this time. Pt was unable to participate in interview, mumbling with slurred speech and foul language. No fat or muscle depletion noted on temple, orbital, clavicle, dorsal hand, acronium, upper arm, or thoracic region, however, pt with generalized edema.   Noted pt has gained 35# in the past 3 months; pt has a previous hx of weight loss.   Chart review indicates that pt has a hx of severe malnutrition, however, pt noted has gained weight and physical exam did not reveal signs of fat or muscle depletion. Pt does not meet criteria for malnutrition at this time.   Labs reviewed: K: 3.0.   Diet Order:  Diet regular Room service appropriate?: Yes; Fluid consistency:: Thin  Skin:   Reviewed, no issues  Last BM:  08/07/15  Height:   Ht Readings from Last 1 Encounters:  08/04/15 5' (1.524 m)    Weight:   Wt Readings from Last 1 Encounters:  08/04/15 135 lb 2.3 oz (61.3 kg)    Ideal Body Weight:  45.5 kg  BMI:  Body mass index is 26.39 kg/(m^2).  Estimated Nutritional Needs:   Kcal:  1500-1700  Protein:  70-80 grams  Fluid:  1.5-1.7 L  EDUCATION NEEDS:   Education needs addressed  [name] A. Mayford Knife, RD, LDN, CDE Pager: [phone] After hours Pager: [phone]

## 2015-08-12 NOTE — Progress Notes (Signed)
PROGRESS NOTE  Rhonda Mitchell ZOX:[id number] DOB: [birth year] DOA: 08/03/2015 PCP: [name], MD  Brief history 54 year old female with a history of alcohol abuse (1 pint vodka daily), tobacco abuse, COPD (non-oxygen dependent), hepatitis C presented with shortness of breath for several days prior to admission. The patient was encephalopathic/obtunded and unable to provide history in the emergency department. She was found to be hypoxic and placed on nonrebreather. She continued to have desaturation into the 70s despite albuterol nebulizers. Alcohol level was 436 at the time of admission. It was thought that her respiratory failure was likely due to respiratory depression from alcohol intoxication. On the morning of 08/07/2015, the patient became increasingly agitated requiring additional doses of Ativan. On 08/08/2015, critical care medicine was consulted due to continued agitation and hallucinations. Fortunately, the patient improved without the use of Precedex. She was transferred back to the medical floor on 08/10/2015. Assessment/Plan: Acute respiratory failure with hypoxia due to hypoventilation and respiratory depression from alcoholic intoxication: - Symbicort 80-4 0.5 BID -Spiriva daily - Titrate O2 to maintain SPO2 88 -93%  -presently stable on RA 93-97%  Alcohol dependence/ Alcohol withdrawal/ Delirium -On admission alcohol 436 -UDS--had benzodiazepines and opiates -8/18 RASS score+ 2 to +4 --transferred to ICU -Continue on CIWA protocol -Fortunately did not require Precedex, transferred back to medical floor 08/10/2015 -08/12/2015--patient is alert and aware of her surroundings, but has bursts of agitation and impulsivity -Obtain capacity evaluation -?? Wernicke's encephalopathy -Start thiamine 500 mg IV every 8 hours 6 doses -UA and culture -check ammonia  Tobacco abuse -Continue Nicoderm patch -still smoking  Hyperglycemia - 8/14 Hemoglobin A1c= 5.6   -Sensitive SSI   Hypomagnesemia/Hypokalemia -repleted  Protein-calorie malnutrition, severe - Consult nutrition once patient is awake,  -Boost Breeze per nutrition  Hyponatremia - Iatrogenic DC D5-0.45% saline  -Start D5-0.9% saline 75 ml/hr-->improved -saline lock  Elevated LFTs: -Likely due to alcohol abuse - improving   Macrocytic anemia: -Anemia panel unrevealing  -Likely due to continued alcohol use -B12 and folate are WNL  Infectious/Chronic Hep C without Coma -HIV negative -RPR negative -Acute hepatitis panel positive hepatitis C virus antibody -HCV Quant viral load = 285,000 -Will need hepatitis B vaccination   Family Communication:attemped to contact significant other--left message Disposition Plan: Home 8/23 if stable     Procedures/Studies: Ct Head Wo Contrast  07/27/2015   CLINICAL DATA:  Acute onset of syncope. Loss of consciousness. Multiple recent falls. Initial encounter.  EXAM: CT HEAD WITHOUT CONTRAST  TECHNIQUE: Contiguous axial images were obtained from the base of the skull through the vertex without intravenous contrast.  COMPARISON:  CT of the head performed 03/15/2015  FINDINGS: There is no evidence of acute infarction, mass lesion, or intra- or extra-axial hemorrhage on CT.  Prominence of the ventricles and sulci reflects mild to moderate cortical volume loss. Cerebellar atrophy is noted. Scattered periventricular white matter change likely reflects small vessel ischemic microangiopathy.  The brainstem and fourth ventricle are within normal limits. The basal ganglia are unremarkable in appearance. The cerebral hemispheres demonstrate grossly normal gray-white differentiation. No mass effect or midline shift is seen.  There is no evidence of fracture; visualized osseous structures are unremarkable in appearance. The orbits are within normal limits. The paranasal sinuses and mastoid air cells are well-aerated. No significant soft tissue  abnormalities are seen.  IMPRESSION: 1. No evidence of traumatic intracranial injury or fracture. 2. Mild to moderate cortical volume loss and scattered small vessel  ischemic microangiopathy.   Electronically Signed   By: Roanna Raider M.D.   On: 07/27/2015 17:41   Dg Abd Acute W/chest  08/03/2015   CLINICAL DATA:  Shortness of breath.  COPD.  Rectal prolapse.  EXAM: DG ABDOMEN ACUTE W/ 1V CHEST  COMPARISON:  One-view chest x-ray 03/18/2015.  FINDINGS: The heart is mildly enlarged. Mild interstitial coarsening is slightly increased.  Gas and stool are present throughout the colon. The bowel gas pattern is otherwise normal. There is no free air or obstruction. The axial skeleton is within normal limits.  IMPRESSION: 1. Mild increased interstitial coarsening could represent mild edema. 2. Negative two view abdomen.   Electronically Signed   By: Marin Roberts M.D.   On: 08/03/2015 17:39         Subjective: Patient has bursts of intense pulsatility denies any fevers, chills, chest pain, shortness of breath, vomiting, diarrhea, abdominal pain, dysuria, hematuria. No rashes.  Objective: Filed Vitals:   08/11/15 0834 08/11/15 1500 08/11/15 1800 08/11/15 2225  BP:  131/69 124/81   Pulse:  84 79 80  Temp:  98.9 F (37.2 C) 98.2 F (36.8 C)   TempSrc:  Oral Tympanic   Resp:  20 18 18   Height:      Weight:      SpO2: 95% 96% 98%     Intake/Output Summary (Last 24 hours) at 08/12/15 1751 Last data filed at 08/12/15 0600  Gross per 24 hour  Intake   2303 ml  Output      0 ml  Net   2303 ml   Weight change:  Exam:   General:  Pt is alert, follows commands appropriately, not in acute distress  HEENT: No icterus, No thrush, No neck mass, [place]/AT  Cardiovascular: RRR, S1/S2, no rubs, no gallops  Respiratory: Diminished breath sounds but clear to auscultation.  Abdomen: Soft/+BS, non tender, non distended, no guarding; no hepatosplenomegaly  Extremities: No edema, No  lymphangitis, No petechiae, No rashes, no synovitis  Data Reviewed: Basic Metabolic Panel:  Recent Labs Lab 08/07/15 0245 08/08/15 0315 08/09/15 0712 08/10/15 0516 08/11/15 1705 08/12/15 0318  NA 136 136 139 139 141 139  K 4.3 3.9 2.9* 4.4 2.9* 3.0*  CL 101 100* 103 106 106 105  CO2 24 25 28 22 25 25   GLUCOSE 118* 105* 106* 99 120* 113*  BUN 9 7 <5* 6 5* <5*  CREATININE 0.64 0.62 0.52 0.57 0.62 0.59  CALCIUM 8.2* 8.9 8.4* 8.1* 8.4* 8.3*  MG 1.7 1.4* 1.9 1.7 1.7  --   PHOS  --   --  4.9* 4.5  --   --    Liver Function Tests:  Recent Labs Lab 08/08/15 0315 08/09/15 0712 08/10/15 0516 08/11/15 1705 08/12/15 0318  AST 278* 115* 109* 87* 84*  ALT 87* 64* 59* 51 49  ALKPHOS 181* 158* 148* 148* 146*  BILITOT 1.3* 1.1 1.0 0.5 0.5  PROT 6.8 6.5 5.9* 6.0* 6.0*  ALBUMIN 2.9* 2.6* 2.4* 2.4* 2.5*   No results for input(s): LIPASE, AMYLASE in the last 168 hours.  Recent Labs Lab 08/08/15 1149  AMMONIA 37*   CBC:  Recent Labs Lab 08/07/15 0245 08/08/15 0315 08/09/15 0712 08/10/15 0516 08/12/15 0318  WBC 5.2 5.2 5.9 6.4 4.1  NEUTROABS 3.6 3.6 3.6  --   --   HGB 12.0 10.2* 10.0* 10.4* 9.2*  HCT 36.1 31.1* 30.6* 32.3* 28.5*  MCV 101.7* 101.0* 101.3* 100.9* 99.3  PLT 168 173 202 212  248   Cardiac Enzymes: No results for input(s): CKTOTAL, CKMB, CKMBINDEX, TROPONINI in the last 168 hours. BNP: Invalid input(s): POCBNP CBG:  Recent Labs Lab 08/07/15 2331 08/08/15 0743 08/08/15 1207 08/08/15 1536 08/08/15 2214  GLUCAP 103* 100* 110* 111* 109*    Recent Results (from the past 240 hour(s))  MRSA PCR Screening     Status: None   Collection Time: 08/04/15  2:00 AM  Result Value Ref Range Status   MRSA by PCR NEGATIVE NEGATIVE Final    Comment:        The GeneXpert MRSA Assay (FDA approved for NASAL specimens only), is one component of a comprehensive MRSA colonization surveillance program. It is not intended to diagnose MRSA infection nor to guide  or monitor treatment for MRSA infections.      Scheduled Meds: . antiseptic oral rinse  7 mL Mouth Rinse q12n4p  . budesonide-formoterol  2 puff Inhalation BID  . chlorhexidine  15 mL Mouth Rinse BID  . DULoxetine  60 mg Oral Daily  . feeding supplement  1 Container Oral TID BM  . folic acid  1 mg Oral Daily  . heparin  5,000 Units Subcutaneous 3 times per day  . multivitamin with minerals  1 tablet Oral Daily  . nicotine  21 mg Transdermal Daily  . sodium chloride  3 mL Intravenous Q12H  . thiamine IV  500 mg Intravenous TID  . tiotropium  18 mcg Inhalation Daily   Continuous Infusions: . dextrose 5 % and 0.9% NaCl 75 mL/hr at 08/11/15 0342     Pattrick Bady, DO  [facility] Pager [phone]  If 7PM-7AM, please contact night-coverage [email or web address] Password [facility] 08/12/2015, 5:51 PM   LOS: 9 days

## 2015-08-12 NOTE — Evaluation (Signed)
Physical Therapy Evaluation Patient Details Name: Rhonda Mitchell MRN: [id number] DOB: [birth year] Today's Date: 08/12/2015   History of Present Illness  54 year old female with a history of alcohol abuse (1 pint vodka daily), tobacco abuse, COPD (non-oxygen dependent), hepatitis C presented with shortness of breath for several days prior to admission. The patient was encephalopathic/obtunded and unable to provide history in the emergency department. She was found to be hypoxic and placed on nonrebreather. She continued to have desaturation into the 70s despite albuterol nebulizers. Alcohol level was 436 at the time of admission. It was thought that her respiratory failure was likely due to respiratory depression from alcohol intoxication.  Pt also with PMH of chronic hepatitis C.   Clinical Impression  Pt admitted with above diagnosis. Pt currently with functional limitations due to the deficits listed below (see PT Problem List).  Pt will benefit from skilled PT to increase their independence and safety with mobility to allow discharge to the venue listed below.  At time of evaluation, pt is a HIGH FALL RISK with decreased ability to follow instructions or remember safety cues.  Recommend 24 hour S and SNF at this time.  If pt progresses in acute care and demonstrates increased balance then can re-assess d/c recs at that time.     Follow Up Recommendations SNF;Supervision/Assistance - 24 hour    Equipment Recommendations  Other (comment) (to be determined)    Recommendations for Other Services       Precautions / Restrictions Precautions Precautions: Fall Precaution Comments: Pt reports 15 falls in last 6 months      Mobility  Bed Mobility                  Transfers Overall transfer level: Needs assistance Equipment used: Rolling walker (2 wheeled) Transfers: Sit to/from Stand Sit to Stand: Min guard         General transfer comment: decreased balance with decreased  safety  Ambulation/Gait Ambulation/Gait assistance: Mod assist;Min assist;Min guard Ambulation Distance (Feet): 300 Feet Assistive device: Rolling walker (2 wheeled) Gait Pattern/deviations: Antalgic;Shuffle;Narrow base of support;Ataxic;Decreased stride length     General Gait Details: Pt with poor safety with use of RW running into wall and doorframes on the R frequently even with cues to anticipate obstacle.  However without RW, pt's balance is even unsteadier.  Pt is impulsive and unsafe with gait.  A level fluctuated between min/guard to MOD A.  Stairs            Wheelchair Mobility    Modified Rankin (Stroke Patients Only)       Balance Overall balance assessment: Needs assistance;History of Falls   Sitting balance-Leahy Scale: Good       Standing balance-Leahy Scale: Poor                 High Level Balance Comments: Pt reports 15 falls in last 6 months             Pertinent Vitals/Pain Pain Assessment: 0-10 Pain Score: 8  Pain Location: R arm where she has healing sores Pain Descriptors / Indicators: Burning ("like a blow torch")    Home Living Family/patient expects to be discharged to:: Private residence Living Arrangements: Spouse/significant other   Type of Home: House Home Access: Ramped entrance     Home Layout: One level Home Equipment: None Additional Comments: Pt states she has no DME, although on last admission states she had SPC, quad cane, rollator    Prior Function Level of  Independence: Independent               Hand Dominance        Extremity/Trunk Assessment   Upper Extremity Assessment: Overall WFL for tasks assessed           Lower Extremity Assessment: Overall WFL for tasks assessed;Generalized weakness         Communication      Cognition Arousal/Alertness: Awake/alert Behavior During Therapy: Restless;Agitated Overall Cognitive Status: Impaired/Different from baseline Area of Impairment:  Following commands;Memory;Safety/judgement;Awareness;Problem solving     Memory: Decreased short-term memory Following Commands: Follows one step commands inconsistently Safety/Judgement: Decreased awareness of safety Awareness: Intellectual Problem Solving: Slow processing;Requires verbal cues;Requires tactile cues General Comments: slurred speech    General Comments General comments (skin integrity, edema, etc.): While doing documentation outside of room, pt attempted to get up 4 times in about 20 mins with chair alarm.  Nursing in and got pt in bed and states they are requesting a sitter.    Exercises        Assessment/Plan    PT Assessment Patient needs continued PT services  PT Diagnosis Difficulty walking   PT Problem List Decreased balance;Decreased mobility;Decreased knowledge of use of DME;Decreased safety awareness  PT Treatment Interventions Gait training;Functional mobility training;Therapeutic activities;Therapeutic exercise;DME instruction;Balance training;Neuromuscular re-education   PT Goals (Current goals can be found in the Care Plan section) Acute Rehab PT Goals Patient Stated Goal: go home today PT Goal Formulation: With patient Time For Goal Achievement: 08/26/15 Potential to Achieve Goals: Good    Frequency Min 3X/week   Barriers to discharge        Co-evaluation               End of Session Equipment Utilized During Treatment: Gait belt Activity Tolerance: Treatment limited secondary to agitation Patient left: in chair;with call bell/phone within reach;with chair alarm set Nurse Communication: Mobility status         Time: 0925-0950 PT Time Calculation (min) (ACUTE ONLY): 25 min   Charges:   PT Evaluation $Initial PT Evaluation Tier I: 1 Procedure PT Treatments $Gait Training: 8-22 mins   PT G Codes:        Cristian Davitt LUBECK 08/12/2015, 10:23 AM

## 2015-08-12 NOTE — Progress Notes (Signed)
CSW saw PT recommendation for SNF placement.  Pt is uninsured and would need LOG placement- CSW discussed case with supervisor- pt is walking 381ft but do to fall concerns and cognitive ability right now would potentially qualify for placement depending on home care needs.  CSW attempted to call pt "significant other" Mr. Myna Bright- no answer- left messages  CSW will continue to follow  Merlyn Lot, [facility] Clinical Social Worker [phone]

## 2015-08-13 DIAGNOSIS — F10229 Alcohol dependence with intoxication, unspecified: Secondary | ICD-10-CM

## 2015-08-13 LAB — BASIC METABOLIC PANEL
ANION GAP: 9 (ref 5–15)
BUN: 5 mg/dL — ABNORMAL LOW (ref 6–20)
CALCIUM: 8.5 mg/dL — AB (ref 8.9–10.3)
CO2: 26 mmol/L (ref 22–32)
Chloride: 103 mmol/L (ref 101–111)
Creatinine, Ser: 0.52 mg/dL (ref 0.44–1.00)
Glucose, Bld: 116 mg/dL — ABNORMAL HIGH (ref 65–99)
POTASSIUM: 3.2 mmol/L — AB (ref 3.5–5.1)
SODIUM: 138 mmol/L (ref 135–145)

## 2015-08-13 LAB — MAGNESIUM: MAGNESIUM: 1.7 mg/dL (ref 1.7–2.4)

## 2015-08-13 LAB — AMMONIA: AMMONIA: 61 umol/L — AB (ref 9–35)

## 2015-08-13 MED ORDER — QUETIAPINE FUMARATE 50 MG PO TABS
25.0000 mg | ORAL_TABLET | Freq: Two times a day (BID) | ORAL | Status: DC
  Administered 2015-08-13 – 2015-08-21 (×17): 25 mg via ORAL
  Filled 2015-08-13 (×18): qty 1

## 2015-08-13 MED ORDER — POTASSIUM CHLORIDE CRYS ER 20 MEQ PO TBCR
40.0000 meq | EXTENDED_RELEASE_TABLET | Freq: Once | ORAL | Status: AC
  Administered 2015-08-13: 40 meq via ORAL
  Filled 2015-08-13: qty 2

## 2015-08-13 NOTE — Progress Notes (Signed)
PROGRESS NOTE  Rhonda ShullerMRN:[id number] DOB: [birth year] DOA: 08/03/2015 PCP: [name], MD  Brief history 54 year old female with a history of alcohol abuse (1 pint vodka daily), tobacco abuse, COPD (non-oxygen dependent), hepatitis C presented with shortness of breath for several days prior to admission. The patient was encephalopathic/obtunded and unable to provide history in the emergency department. She was found to be hypoxic and placed on nonrebreather. She continued to have desaturation into the 70s despite albuterol nebulizers. Alcohol level was 436 at the time of admission. It was thought that her respiratory failure was likely due to respiratory depression from alcohol intoxication. On the morning of 08/07/2015, the patient became increasingly agitated requiring additional doses of Ativan. On 08/08/2015, critical care medicine was consulted due to continued agitation and hallucinations. Fortunately, the patient improved without the use of Precedex. She was transferred back to the medical floor on 08/10/2015.  Since then, the patient has been lucid, alert and oriented, but has bouts of impulsiveness. Assessment/Plan: Acute respiratory failure with hypoxia due to hypoventilation and respiratory depression from alcoholic intoxication: - Symbicort 80-4 0.5 BID -Spiriva daily - Titrate O2 to maintain SPO2 88 -93%  -presently stable on RA 93-97%  Alcohol dependence/ Alcohol withdrawal/ Delirium -On admission alcohol 436 -UDS--had benzodiazepines and opiates -8/18 RASS score+ 2 to +4 --transferred to ICU -Continue on CIWA protocol -Fortunately did not require Precedex, transferred back to medical floor 08/10/2015 -08/12/2015--patient is alert and aware of her surroundings, but has bursts of agitation and impulsivity -Obtain capacity evaluation -?? Wernicke's encephalopathy -Start thiamine 500 mg IV every 8 hours 6 doses--pt refusing it IV--will try to give po -UA and  culture--pt refuses to give sample -check ammonia--61--will not treat as pt is awake and alert and conversant -08/13/15--spoke with the patient's significant other. He states that the patient is at her baseline where she has episodes of angry outbursts.  He feels that she has underlying psychiatric disorder  -08/13/2015--psychiatry consulted--feels that patient needs inpatient psych admission--pt will need IVC as she presently refuses -case discussed with Dr. Elsie Saas -PT IS MEDICALLY STABLE TO BE TRANSFERRED TO PSYCHIATRY  Tobacco abuse -Continue Nicoderm patch -still smoking--No desire to quit  Hyperglycemia - 8/14 Hemoglobin A1c= 5.6  -d/c SSI as pt refusing   Hypomagnesemia/Hypokalemia -repleted  Protein-calorie malnutrition, severe - Consult nutrition once patient is awake,  -Boost Breeze per nutrition  Hyponatremia - Iatrogenic DC D5-0.45% saline  -Start D5-0.9% saline 75 ml/hr-->improved -saline locked  Elevated LFTs: -Likely due to alcohol abuse - improving   Macrocytic anemia: -Anemia panel unrevealing  -Likely due to continued alcohol use -B12 and folate are WNL  Infectious/Chronic Hep C without Coma -HIV negative -RPR negative -Acute hepatitis panel positive hepatitis C virus antibody -HCV Quant viral load = 285,000 -Will need hepatitis B vaccination   Family Communication:updated significant other Disposition Plan: STABLE FOR TRANSFER TO PSYCHIATRY   Procedures/Studies: Ct Head Wo Contrast  07/27/2015   CLINICAL DATA:  Acute onset of syncope. Loss of consciousness. Multiple recent falls. Initial encounter.  EXAM: CT HEAD WITHOUT CONTRAST  TECHNIQUE: Contiguous axial images were obtained from the base of the skull through the vertex without intravenous contrast.  COMPARISON:  CT of the head performed 03/15/2015  FINDINGS: There is no evidence of acute infarction, mass lesion, or intra- or extra-axial hemorrhage on CT.  Prominence of the  ventricles and sulci reflects mild to moderate cortical volume loss. Cerebellar atrophy is noted. Scattered periventricular white  matter change likely reflects small vessel ischemic microangiopathy.  The brainstem and fourth ventricle are within normal limits. The basal ganglia are unremarkable in appearance. The cerebral hemispheres demonstrate grossly normal gray-white differentiation. No mass effect or midline shift is seen.  There is no evidence of fracture; visualized osseous structures are unremarkable in appearance. The orbits are within normal limits. The paranasal sinuses and mastoid air cells are well-aerated. No significant soft tissue abnormalities are seen.  IMPRESSION: 1. No evidence of traumatic intracranial injury or fracture. 2. Mild to moderate cortical volume loss and scattered small vessel ischemic microangiopathy.   Electronically Signed   By: Roanna Raider M.D.   On: 07/27/2015 17:41   Dg Abd Acute W/chest  08/03/2015   CLINICAL DATA:  Shortness of breath.  COPD.  Rectal prolapse.  EXAM: DG ABDOMEN ACUTE W/ 1V CHEST  COMPARISON:  One-view chest x-ray 03/18/2015.  FINDINGS: The heart is mildly enlarged. Mild interstitial coarsening is slightly increased.  Gas and stool are present throughout the colon. The bowel gas pattern is otherwise normal. There is no free air or obstruction. The axial skeleton is within normal limits.  IMPRESSION: 1. Mild increased interstitial coarsening could represent mild edema. 2. Negative two view abdomen.   Electronically Signed   By: Marin Roberts M.D.   On: 08/03/2015 17:39         Subjective: Patient denies fevers, chills, headache, chest pain, dyspnea, nausea, vomiting, diarrhea, abdominal pain, dysuria, hematuria   Objective: Filed Vitals:   08/12/15 2043 08/12/15 2251 08/13/15 0502 08/13/15 1500  BP:  142/78 115/77 115/78  Pulse:  76 78 74  Temp:  98.3 F (36.8 C) 98.2 F (36.8 C) 98.2 F (36.8 C)  TempSrc:  Axillary Oral  Axillary  Resp:  19 18 18   Height:      Weight:      SpO2: 98% 98% 98% 98%    Intake/Output Summary (Last 24 hours) at 08/13/15 1744 Last data filed at 08/13/15 1300  Gross per 24 hour  Intake    680 ml  Output      0 ml  Net    680 ml   Weight change:  Exam:   General:  Pt is alert, follows commands appropriately, not in acute distress  HEENT: No icterus, No thrush, No neck mass, [place]/AT  Cardiovascular: RRR, S1/S2, no rubs, no gallops  Respiratory: Diminished breath sounds but with auscultation. No wheezing. Good air movement.   Abdomen: Soft/+BS, non tender, non distended, no guarding  Extremities: No edema, No lymphangitis, No petechiae, No rashes, no synovitis  Data Reviewed: Basic Metabolic Panel:  Recent Labs Lab 08/08/15 0315 08/09/15 0712 08/10/15 0516 08/11/15 1705 08/12/15 0318 08/13/15 0437  NA 136 139 139 141 139 138  K 3.9 2.9* 4.4 2.9* 3.0* 3.2*  CL 100* 103 106 106 105 103  CO2 25 28 22 25 25 26   GLUCOSE 105* 106* 99 120* 113* 116*  BUN 7 <5* 6 5* <5* 5*  CREATININE 0.62 0.52 0.57 0.62 0.59 0.52  CALCIUM 8.9 8.4* 8.1* 8.4* 8.3* 8.5*  MG 1.4* 1.9 1.7 1.7  --  1.7  PHOS  --  4.9* 4.5  --   --   --    Liver Function Tests:  Recent Labs Lab 08/08/15 0315 08/09/15 0712 08/10/15 0516 08/11/15 1705 08/12/15 0318  AST 278* 115* 109* 87* 84*  ALT 87* 64* 59* 51 49  ALKPHOS 181* 158* 148* 148* 146*  BILITOT 1.3* 1.1 1.0  0.5 0.5  PROT 6.8 6.5 5.9* 6.0* 6.0*  ALBUMIN 2.9* 2.6* 2.4* 2.4* 2.5*   No results for input(s): LIPASE, AMYLASE in the last 168 hours.  Recent Labs Lab 08/08/15 1149 08/13/15 0547  AMMONIA 37* 61*   CBC:  Recent Labs Lab 08/07/15 0245 08/08/15 0315 08/09/15 0712 08/10/15 0516 08/12/15 0318  WBC 5.2 5.2 5.9 6.4 4.1  NEUTROABS 3.6 3.6 3.6  --   --   HGB 12.0 10.2* 10.0* 10.4* 9.2*  HCT 36.1 31.1* 30.6* 32.3* 28.5*  MCV 101.7* 101.0* 101.3* 100.9* 99.3  PLT 168 173 202 212 248   Cardiac Enzymes: No results  for input(s): CKTOTAL, CKMB, CKMBINDEX, TROPONINI in the last 168 hours. BNP: Invalid input(s): POCBNP CBG:  Recent Labs Lab 08/07/15 2331 08/08/15 0743 08/08/15 1207 08/08/15 1536 08/08/15 2214  GLUCAP 103* 100* 110* 111* 109*    Recent Results (from the past 240 hour(s))  MRSA PCR Screening     Status: None   Collection Time: 08/04/15  2:00 AM  Result Value Ref Range Status   MRSA by PCR NEGATIVE NEGATIVE Final    Comment:        The GeneXpert MRSA Assay (FDA approved for NASAL specimens only), is one component of a comprehensive MRSA colonization surveillance program. It is not intended to diagnose MRSA infection nor to guide or monitor treatment for MRSA infections.      Scheduled Meds: . antiseptic oral rinse  7 mL Mouth Rinse q12n4p  . budesonide-formoterol  2 puff Inhalation BID  . chlorhexidine  15 mL Mouth Rinse BID  . DULoxetine  60 mg Oral Daily  . feeding supplement  1 Container Oral TID BM  . folic acid  1 mg Oral Daily  . heparin  5,000 Units Subcutaneous 3 times per day  . multivitamin with minerals  1 tablet Oral Daily  . nicotine  21 mg Transdermal Daily  . QUEtiapine  25 mg Oral BID  . sodium chloride  3 mL Intravenous Q12H  . thiamine IV  500 mg Intravenous TID  . tiotropium  18 mcg Inhalation Daily   Continuous Infusions: . dextrose 5 % and 0.9% NaCl 75 mL/hr at 08/11/15 0342     Jacy Howat, DO  [facility] Pager [phone]  If 7PM-7AM, please contact night-coverage [email or web address] Password [facility] 08/13/2015, 5:44 PM   LOS: 10 days

## 2015-08-13 NOTE — Consult Note (Signed)
[facility] Consult   Reason for Consult:  Alcohol dependence with alcohol intoxication and capacity evaluation Referring Physician:  Dr. Carles Collet Patient Identification: Rhonda Mitchell MRN:  [id number] Principal Diagnosis: Alcohol dependence Diagnosis:   Patient Active Problem List   Diagnosis Date Noted  . Hypomagnesemia [E83.42]   . Protein-calorie malnutrition [E46]   . Hepatitis C antibody test positive [R89.4]   . Elevated LFTs [R79.89]   . Hyperkalemia [E87.5]   . Severe protein-calorie malnutrition [E43]   . Acute respiratory failure with hypoxia [J96.01] 08/03/2015  . Septic olecranon bursitis of right elbow [M70.21, B96.89] 05/08/2015  . Septic bursitis of elbow [M71.129] 05/07/2015  . Thrombocytopenia [D69.6] 05/07/2015  . Chronic hepatitis C without hepatic coma [B18.2] 05/07/2015  . Hypokalemia [E87.6] 05/07/2015  . Abscess of bursa of elbow [M71.029] 05/07/2015  . COPD exacerbation [J44.1] 03/18/2015  . Hyponatremia [E87.1] 03/18/2015  . Hyperglycemia [R73.9] 03/18/2015  . Alcohol dependence [F10.20] 12/07/2014  . Alcohol withdrawal [F10.239] 12/07/2014  . Protein-calorie malnutrition, severe [E43] 12/07/2014  . Musculoskeletal chest pain [R07.89] 12/07/2014  . Tobacco abuse [Z72.0] 12/07/2014  . Carbuncle and furuncle [L02.92, L02.93] 12/07/2014  . Macrocytic anemia [D53.9] 12/07/2014  . Chest pain [R07.9] 11/30/2014  . Tachycardia [R00.0] 11/30/2014  . COPD (chronic obstructive pulmonary disease) [J44.9] 11/30/2014  . Cellulitis and abscess [L03.90, L02.91] 11/30/2014  . Atypical chest pain [R07.89]   . Dehydration [E86.0]     Total Time spent with patient: 1 hour  Subjective:   Rhonda Mitchell is a 54 y.o. female patient admitted with alcohol intoxication and dependence.  HPI:  Rhonda Mitchell is a 54 years old female seen face-to-face for psychiatric consultation and evaluation of alcohol intoxication and withdrawal symptoms and capacity  evaluation. Patient is a poor historian and not responded most of the questions asked today. Patient is awake and easily responding to verbal stimuli but decided not to talk today. Patient appeared lying in her bed, calm and without distress. Information for this evaluation up 10 from the available medical records and case discussed with the staff in the room and also staff RN. Patient has been poorly responsive to the verbal stimuli, difficult to redirect, easily getting irritable, confused and combative. Patient was known to have false twice yesterday. Reportedly patient has been suffering with severe alcohol intoxication, dependence and abuse. Patient blood alcohol level on arrival was 436. Patient has previous hospital visits with the alcohol intoxication. Reportedly patient boyfriend has been worried about patient inability to manage herself without rehabilitation treatment after medically stabilized. Based on my current evaluation patient does not meet criteria for capacity to make her own medical decisions or living arrangements. Patient benefit from Ativan alcohol detox treatment and also inpatient rehabilitation treatment: Medically stable.  On the morning of 08/07/2015, the patient became increasingly agitated requiring additional doses of Ativan. On 08/08/2015, critical care medicine was consulted due to continued agitation and hallucinations. Fortunately, the patient improved without the use of Precedex. She was transferred back to the medical floor on 08/10/2015. Patient continued to be poorly cooperative with the staff and current treatment recommendations.  HPI Elements:   Location:  Substance-induced mood disorder, alcohol dependence. Quality:  Poor. Severity:  Multiple alcohol intoxication's. Timing:  Unknown. Duration:  Few days. Context:  Psychosocial stresses which are unknown at this time.  Past Medical History:  Past Medical History  Diagnosis Date  . COPD (chronic obstructive  pulmonary disease)   . Anxiety   . Shortness of breath   .  Arthritis   . GERD (gastroesophageal reflux disease)   . Full dentures   . Insomnia   . Alcohol abuse   . Hepatitis C   . Alcohol abuse   . History of MRSA infection   . History of encephalopathy     Past Surgical History  Procedure Laterality Date  . Arm surgery    . Hand surgery  1990    both rt/lt carpal tunnels  . Shoulder surgery      right and left-age [age]  . Orif ulnar / radial shaft fracture  1990    right-got infection-had total 10 surgeies 1990  . Orif ulnar fracture Left 05/23/2014    Procedure: OPEN REDUCTION INTERNAL FIXATION (ORIF) LEFT ULNAR FRACTURE;  Surgeon: Alta Corning, MD;  Location: [facility];  Service: Orthopedics;  Laterality: Left;  . I&d extremity Right 05/07/2015    Procedure: IRRIGATION AND DEBRIDEMENT  ELBOW ;  Surgeon: Roseanne Kaufman, MD;  Location: [facility];  Service: Orthopedics;  Laterality: Right;   Family History:  Family History  Problem Relation Age of Onset  . Breast cancer Mother    Social History:  History  Alcohol Use  . Yes    Comment: 1/5th Vodka every day      History  Drug Use No    Social History   Social History  . Marital Status: Legally Separated    Spouse Name: N/A  . Number of Children: N/A  . Years of Education: N/A   Social History Main Topics  . Smoking status: Current Every Day Smoker -- 1.00 packs/day for 20 years    Types: Cigarettes  . Smokeless tobacco: Never Used  . Alcohol Use: Yes     Comment: 1/5th Vodka every day   . Drug Use: No  . Sexual Activity: Yes    Birth Control/ Protection: Post-menopausal   Other Topics Concern  . None   Social History Narrative   Additional Social History:                          Allergies:  No Known Allergies  Labs:  Results for orders placed or performed during the hospital encounter of 08/03/15 (from the past 48 hour(s))  Comprehensive metabolic panel     Status: Abnormal    Collection Time: 08/11/15  5:05 PM  Result Value Ref Range   Sodium 141 135 - 145 mmol/L   Potassium 2.9 (L) 3.5 - 5.1 mmol/L    Comment: DELTA CHECK NOTED   Chloride 106 101 - 111 mmol/L   CO2 25 22 - 32 mmol/L   Glucose, Bld 120 (H) 65 - 99 mg/dL   BUN 5 (L) 6 - 20 mg/dL   Creatinine, Ser 0.62 0.44 - 1.00 mg/dL   Calcium 8.4 (L) 8.9 - 10.3 mg/dL   Total Protein 6.0 (L) 6.5 - 8.1 g/dL   Albumin 2.4 (L) 3.5 - 5.0 g/dL   AST 87 (H) 15 - 41 U/L   ALT 51 14 - 54 U/L   Alkaline Phosphatase 148 (H) 38 - 126 U/L   Total Bilirubin 0.5 0.3 - 1.2 mg/dL   GFR calc non Af Amer >60 >60 mL/min   GFR calc Af Amer >60 >60 mL/min    Comment: (NOTE) The eGFR has been calculated using the CKD EPI equation. This calculation has not been validated in all clinical situations. eGFR's persistently <60 mL/min signify possible Chronic Kidney Disease.  Anion gap 10 5 - 15  Magnesium     Status: None   Collection Time: 08/11/15  5:05 PM  Result Value Ref Range   Magnesium 1.7 1.7 - 2.4 mg/dL  Comprehensive metabolic panel     Status: Abnormal   Collection Time: 08/12/15  3:18 AM  Result Value Ref Range   Sodium 139 135 - 145 mmol/L   Potassium 3.0 (L) 3.5 - 5.1 mmol/L   Chloride 105 101 - 111 mmol/L   CO2 25 22 - 32 mmol/L   Glucose, Bld 113 (H) 65 - 99 mg/dL   BUN <5 (L) 6 - 20 mg/dL   Creatinine, Ser 0.59 0.44 - 1.00 mg/dL   Calcium 8.3 (L) 8.9 - 10.3 mg/dL   Total Protein 6.0 (L) 6.5 - 8.1 g/dL   Albumin 2.5 (L) 3.5 - 5.0 g/dL   AST 84 (H) 15 - 41 U/L   ALT 49 14 - 54 U/L   Alkaline Phosphatase 146 (H) 38 - 126 U/L   Total Bilirubin 0.5 0.3 - 1.2 mg/dL   GFR calc non Af Amer >60 >60 mL/min   GFR calc Af Amer >60 >60 mL/min    Comment: (NOTE) The eGFR has been calculated using the CKD EPI equation. This calculation has not been validated in all clinical situations. eGFR's persistently <60 mL/min signify possible Chronic Kidney Disease.    Anion gap 9 5 - 15  CBC     Status:  Abnormal   Collection Time: 08/12/15  3:18 AM  Result Value Ref Range   WBC 4.1 4.0 - 10.5 K/uL   RBC 2.87 (L) 3.87 - 5.11 MIL/uL   Hemoglobin 9.2 (L) 12.0 - 15.0 g/dL   HCT 28.5 (L) 36.0 - 46.0 %   MCV 99.3 78.0 - 100.0 fL   MCH 32.1 26.0 - 34.0 pg   MCHC 32.3 30.0 - 36.0 g/dL   RDW 19.5 (H) 11.5 - 15.5 %   Platelets 248 150 - 400 K/uL  Basic metabolic panel     Status: Abnormal   Collection Time: 08/13/15  4:37 AM  Result Value Ref Range   Sodium 138 135 - 145 mmol/L   Potassium 3.2 (L) 3.5 - 5.1 mmol/L   Chloride 103 101 - 111 mmol/L   CO2 26 22 - 32 mmol/L   Glucose, Bld 116 (H) 65 - 99 mg/dL   BUN 5 (L) 6 - 20 mg/dL   Creatinine, Ser 0.52 0.44 - 1.00 mg/dL   Calcium 8.5 (L) 8.9 - 10.3 mg/dL   GFR calc non Af Amer >60 >60 mL/min   GFR calc Af Amer >60 >60 mL/min    Comment: (NOTE) The eGFR has been calculated using the CKD EPI equation. This calculation has not been validated in all clinical situations. eGFR's persistently <60 mL/min signify possible Chronic Kidney Disease.    Anion gap 9 5 - 15  Magnesium     Status: None   Collection Time: 08/13/15  4:37 AM  Result Value Ref Range   Magnesium 1.7 1.7 - 2.4 mg/dL  Ammonia     Status: Abnormal   Collection Time: 08/13/15  5:47 AM  Result Value Ref Range   Ammonia 61 (H) 9 - 35 umol/L    Vitals: Blood pressure 115/77, pulse 78, temperature 98.2 F (36.8 C), temperature source Oral, resp. rate 18, height 5' (1.524 m), weight 61.3 kg (135 lb 2.3 oz), SpO2 98 %.  Risk to Self: Is patient at risk for  suicide?: No Risk to Others:   Prior Inpatient Therapy:   Prior Outpatient Therapy:    Current Facility-Administered Medications  Medication Dose Route Frequency Provider Last Rate Last Dose  . acetaminophen (TYLENOL) tablet 650 mg  650 mg Oral Q6H PRN Orson Eva, MD   650 mg at 08/11/15 2059  . antiseptic oral rinse (CPC / CETYLPYRIDINIUM CHLORIDE 0.05%) solution 7 mL  7 mL Mouth Rinse q12n4p Cherene Altes, MD   7  mL at 08/12/15 1534  . budesonide-formoterol (SYMBICORT) 80-4.5 MCG/ACT inhaler 2 puff  2 puff Inhalation BID Charlynne Cousins, MD   2 puff at 08/12/15 2043  . chlorhexidine (PERIDEX) 0.12 % solution 15 mL  15 mL Mouth Rinse BID Cherene Altes, MD   15 mL at 08/12/15 2144  . dextrose 5 %-0.9 % sodium chloride infusion   Intravenous Continuous Rahul P Desai, PA-C 75 mL/hr at 08/11/15 0342    . DULoxetine (CYMBALTA) DR capsule 60 mg  60 mg Oral Daily Charlynne Cousins, MD   60 mg at 08/12/15 1102  . feeding supplement (BOOST / RESOURCE BREEZE) liquid 1 Container  1 Container Oral TID BM Jenifer A Williams, RD   1 Container at 39/76/73 4193  . folic acid (FOLVITE) tablet 1 mg  1 mg Oral Daily Wynell Balloon, RPH   1 mg at 08/12/15 1109  . heparin injection 5,000 Units  5,000 Units Subcutaneous 3 times per day Charlynne Cousins, MD   5,000 Units at 08/13/15 [id number]  . LORazepam (ATIVAN) injection 2-4 mg  2-4 mg Intravenous Q2H PRN Orson Eva, MD   4 mg at 08/13/15 0537   Or  . LORazepam (ATIVAN) tablet 2-4 mg  2-4 mg Oral Q2H PRN Orson Eva, MD      . multivitamin with minerals tablet 1 tablet  1 tablet Oral Daily Rahul P Desai, PA-C   1 tablet at 08/12/15 1840  . nicotine (NICODERM CQ - dosed in mg/24 hours) patch 21 mg  21 mg Transdermal Daily Allie Bossier, MD   21 mg at 08/12/15 1116  . potassium chloride SA (K-DUR,KLOR-CON) CR tablet 40 mEq  40 mEq Oral Once Shanon Brow Tat, MD      . sodium chloride 0.9 % injection 3 mL  3 mL Intravenous Q12H Charlynne Cousins, MD   3 mL at 08/12/15 2145  . thiamine 531m in normal saline (529m IVPB  500 mg Intravenous TID DaOrson EvaMD   500 mg at 08/12/15 2144  . tiotropium (SPIRIVA) inhalation capsule 18 mcg  18 mcg Inhalation Daily AbCharlynne CousinsMD   18 mcg at 08/11/15 [id number]  Musculoskeletal: Strength & Muscle Tone: decreased Gait & Station: unable to stand Patient leans: N/A  Psychiatric Specialty Exam: Physical Exam as per history and  physical   ROS patient is poorly cooperative to to current mental status   Blood pressure 115/77, pulse 78, temperature 98.2 F (36.8 C), temperature source Oral, resp. rate 18, height 5' (1.524 m), weight 61.3 kg (135 lb 2.3 oz), SpO2 98 %.Body mass index is 26.39 kg/(m^2).  General Appearance: Guarded  Eye Contact::  Minimal  Speech:  Blocked and Slow  Volume:  Decreased  Mood:  Anxious and Depressed  Affect:  Depressed and Flat  Thought Process:  Irrelevant and Loose  Orientation:  Other:  Unable to respond to simple questions mostly due to non-cooperative behavior and sedation  Thought Content:  Rumination  Suicidal Thoughts:  No  Homicidal Thoughts:  No  Memory:  Immediate;   Poor  Judgement:  Poor  Insight:  Shallow  Psychomotor Activity:  NA  Concentration:  NA  Recall:  NA  Fund of Knowledge:NA  Language: Fair  Akathisia:  Negative  Handed:  Right  AIMS (if indicated):     Assets:  Communication Skills  ADL's:  Impaired  Cognition: Impaired,  Moderate  Sleep:      Medical Decision Making: New problem, with additional work up planned, Review of Psycho-Social Stressors (1), Review or order clinical lab tests (1), Established Problem, Worsening (2), Review of Last Therapy Session (1), Review or order medicine tests (1), Review of Medication Regimen & Side Effects (2) and Review of New Medication or Change in Dosage (2)  Treatment Plan Summary: Patient presented with alcohol intoxication and later found with the combative behaviors uncooperative behaviors. Patient cannot contract for safety as she was not able to establish safety during this hospitalization and reportedly falling while walking which required safety monitoring at this time Daily contact with patient to assess and evaluate symptoms and progress in treatment and Medication management  Plan:  Psychiatric social service follow-up with the patient regarding appropriate placement when medically stable Safety  concerns: Continue safety sitter Ativan detox protocol for alcohol withdrawal symptoms Recommend psychiatric Inpatient admission when medically cleared. Supportive therapy provided about ongoing stressors.  Appreciate psychiatric consultation and follow up as clinically required Please contact [phone] or [phone] if needs further assistance  Disposition: Recommended residential chemical dependency inpatient rehabilitation services.  Rhonda Mitchell,JANARDHAHA R. 08/13/2015 10:33 AM

## 2015-08-13 NOTE — Clinical Social Work Note (Signed)
Clinical Social Work Assessment  Patient Details  Name: Rhonda Mitchell MRN: [id number] Date of Birth: [birth year]  Date of referral:  08/13/15               Reason for consult:  Facility Placement                Permission sought to share information with:  Family Supports Permission granted to share information::  No (pt disoriented)  Name::     Lavena Bullion  Agency::     Relationship::  significant other  Contact Information:     Housing/Transportation Living arrangements for the past 2 months:  Single Family Home Source of Information:  Partner Patient Interpreter Needed:  None Criminal Activity/Legal Involvement Pertinent to Current Situation/Hospitalization:  No - Comment as needed Significant Relationships:  Significant Other Lives with:  Significant Other Do you feel safe going back to the place where you live?  No Need for family participation in patient care:  Yes (Comment)  Care giving concerns: Pt significant other of 10 years reports that he is a IT trainer and so pt is home alone most days of the week while he works- states there are some other friends in the area but they all work.  Sig. Other states that pt cares for animals at home.   Social Worker assessment / plan:  CSW discussed pt current level of confusion with pt boyfriend and pt support system at home  Employment status:  Self-Employed ("takes care of animals") Insurance information:    PT Recommendations:  Skilled Nursing Facility Information / Referral to community resources:  Skilled Nursing Facility, Inpatient Psychiatric Care (Comment Required) (psych recommending inpatinent substance dependence placement)  Patient/Family's Response to care:  Pt boyfriend states that pt has been declining over past few weeks- states that has had to take her to hospital/emergency clinics multiple times for medical concerns.  States that pt has always liked to drink but that due to rectal prolapse pt has been in extreme pain  and has needed to self medicate heavily with alcohol.    Pt boyfriend expressed concern for pt current medical state and stated that when he spoke to her on the phone in the hospital pt reported that she was at home and trying to get ready to go to work.  Boyfriend thinks that pt will strongly refuse placement in a facility but he feels as if pt will die if she returns home without her medical issues that are causing her pain being fixed- states that pt will likely die from alcohol if pt goes home and continues to self medicate.  Patient/Family's Understanding of and Emotional Response to Diagnosis, Current Treatment, and Prognosis:  Pt boyfriend states he was told that the rectal prolapse would not be treated because it was not life threatening- feels strongly that this is life threatening since it has cause her to increase her drinking.  Emotional Assessment Appearance:  Appears stated age Attitude/Demeanor/Rapport:  Aggressive (Verbally and/or physically), Screaming, Hostile, Uncooperative, Irrational, Combative, Complaining, Reactive Affect (typically observed):  Frustrated, Inappropriate, Irritable, Explosive Orientation:  Oriented to Self, Oriented to Place Alcohol / Substance use:  Alcohol Use Psych involvement (Current and /or in the community):  Yes (Comment) (eval on 8/23- recommend inpatient follow up)  Discharge Needs  Concerns to be addressed:  Cognitive Concerns, Home Safety Concerns Readmission within the last 30 days:  Yes Current discharge risk:    Barriers to Discharge:  Continued Medical Work up, Unsafe home situation, Requiring sitter/restraints,  Psych Bed not available   Izora Ribas, LCSW 08/13/2015, 3:00 PM

## 2015-08-13 NOTE — Progress Notes (Signed)
Pt's IVC paperwork has been completed and faxed to Magistrate.

## 2015-08-14 DIAGNOSIS — F10230 Alcohol dependence with withdrawal, uncomplicated: Secondary | ICD-10-CM

## 2015-08-14 LAB — URINALYSIS, ROUTINE W REFLEX MICROSCOPIC
Glucose, UA: NEGATIVE mg/dL
Hgb urine dipstick: NEGATIVE
KETONES UR: 15 mg/dL — AB
NITRITE: NEGATIVE
PH: 6.5 (ref 5.0–8.0)
PROTEIN: NEGATIVE mg/dL
Specific Gravity, Urine: 1.017 (ref 1.005–1.030)
UROBILINOGEN UA: 1 mg/dL (ref 0.0–1.0)

## 2015-08-14 LAB — BASIC METABOLIC PANEL
Anion gap: 8 (ref 5–15)
BUN: 10 mg/dL (ref 6–20)
CALCIUM: 8.6 mg/dL — AB (ref 8.9–10.3)
CHLORIDE: 104 mmol/L (ref 101–111)
CO2: 26 mmol/L (ref 22–32)
CREATININE: 0.8 mg/dL (ref 0.44–1.00)
GFR calc Af Amer: >60 mL/min (ref >60)
GFR calc non Af Amer: >60 mL/min (ref >60)
Glucose, Bld: 113 mg/dL — ABNORMAL HIGH (ref 65–99)
Potassium: 3.8 mmol/L (ref 3.5–5.1)
SODIUM: 138 mmol/L (ref 135–145)

## 2015-08-14 LAB — URINE MICROSCOPIC-ADD ON

## 2015-08-14 MED ORDER — GI COCKTAIL ~~LOC~~
30.0000 mL | Freq: Once | ORAL | Status: AC
  Administered 2015-08-14: 30 mL via ORAL
  Filled 2015-08-14: qty 30

## 2015-08-14 MED ORDER — KETOROLAC TROMETHAMINE 30 MG/ML IJ SOLN
30.0000 mg | Freq: Once | INTRAMUSCULAR | Status: AC
  Administered 2015-08-14: 30 mg via INTRAVENOUS
  Filled 2015-08-14: qty 1

## 2015-08-14 MED ORDER — GI COCKTAIL ~~LOC~~
30.0000 mL | Freq: Three times a day (TID) | ORAL | Status: DC | PRN
  Administered 2015-08-16: 30 mL via ORAL
  Filled 2015-08-14 (×2): qty 30
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
## 2015-08-14 NOTE — Care Management Note (Signed)
Case Management Note  Patient Details  Name: Rhonda Mitchell MRN: [id number] Date of Birth: 26-Oct-1961  Subjective/Objective:  54 y.o. F admitted with alcohol dependence who continues to have withdrawal symptoms and requires a Comptroller. Psychiatry has IVC'd her and is seeking Psych treatment post discharge when medically stable. CSW aware.   Pt has boyfriend who is equally concerned about the pts drinking and is supportive of treatment afterward.                 Action/Plan:Will continue to follow for final disposition. No CM needs at present.    Expected Discharge Date:                  Expected Discharge Plan:  [facility]  In-House Referral:  Clinical Social Work  Discharge planning Services  CM Consult  Post Acute Care Choice:    Choice offered to:     DME Arranged:    DME Agency:     HH Arranged:    HH Agency:     Status of Service:  In process, will continue to follow  Medicare Important Message Given:    Date Medicare IM Given:    Medicare IM give by:    Date Additional Medicare IM Given:    Additional Medicare Important Message give by:     If discussed at Long Length of Stay Meetings, dates discussed:    Additional Comments:  Yvone Neu, RN 08/14/2015, 2:42 PM

## 2015-08-14 NOTE — Progress Notes (Signed)
[facility] PROGRESS NOTE  Kenda Kloehn ZOX:[id number] DOB: 15-Nov-1961 DOA: 08/03/2015 PCP: [name], MD  Brief history 54 year old female with a history of alcohol abuse (1 pint vodka daily), tobacco abuse, COPD (non-oxygen dependent), hepatitis C presented with shortness of breath for several days prior to admission. The patient was encephalopathic/obtunded and unable to provide history in the emergency department. She was found to be hypoxic and placed on nonrebreather. She continued to have desaturation into the 70s despite albuterol nebulizers. Alcohol level was 436 at the time of admission. It was thought that her respiratory failure was likely due to respiratory depression from alcohol intoxication. On the morning of 08/07/2015, the patient became increasingly agitated requiring additional doses of Ativan. On 08/08/2015, critical care medicine was consulted due to continued agitation and hallucinations. Fortunately, the patient improved without the use of Precedex. She was transferred back to the medical floor on 08/10/2015. Since then, the patient has been lucid, alert and oriented, but has bouts of impulsiveness.  Assessment/Plan:  Acute respiratory failure with hypoxia due to hypoventilation and respiratory depression from alcoholic intoxication: - Symbicort 80-4 0.5 BID -Spiriva daily - Titrate O2 to maintain SPO2 88 -93%  -presently stable on RA 93-97%  Alcohol dependence/ Alcohol withdrawal/ Delirium -On admission alcohol 436 -UDS--had benzodiazepines and opiates -8/18 RASS score+ 2 to +4 --transferred to ICU -Continue on CIWA protocol -Fortunately did not require Precedex, transferred back to medical floor 08/10/2015 -08/12/2015--patient is alert and aware of her surroundings, but has bursts of agitation and impulsivity -Obtain capacity evaluation -?? Wernicke's encephalopathy -Started thiamine 500 mg IV every 8 hours 6 doses--pt refusing it IV--will try to  give po -UA and culture--pt refuses to give sample -check ammonia--61--will not treat as pt is awake and alert and conversant -08/13/15--Dr Tat spoke with the patient's significant other. He stated that the patient is at her baseline where she has episodes of angry outbursts. He feels that she has underlying psychiatric disorder  -08/13/2015--psychiatry consulted--feels that patient needs inpatient psych admission--pt will s/p IVC as she presently refused -case discussed with Dr. Elsie Saas per Dr Tat. -PT IS MEDICALLY STABLE TO BE TRANSFERRED TO INPATIENT PSYCHIATRY  Tobacco abuse -Continue Nicoderm patch -still smoking--No desire to quit  Hyperglycemia - 8/14 Hemoglobin A1c= 5.6  -d/c SSI as pt refusing   Hypomagnesemia/Hypokalemia -repleted  Protein-calorie malnutrition, severe - Patient has been seen by nutrition. Continue current supplementation with boost breeze.   Hyponatremia - Improved with hydration.  Elevated LFTs: -Likely due to alcohol abuse -LFTs trending down.   Macrocytic anemia: -Anemia panel unrevealing  -Likely due to continued alcohol use -B12 and folate are WNL  Infectious/Chronic Hep C without Coma -HIV negative -RPR negative -Acute hepatitis panel positive hepatitis C virus antibody -HCV Quant viral load = 285,000 -Outpatient follow-up.    Code Status: Full Family Communication: Updated patient. No family at bedside. Disposition Plan: Inpatient psychiatric unit when bed available.   Consultants:  Psychiatry: Dr. Elsie Saas 08/13/2015  [facility] : Dr. Vassie Loll 08/08/2015  Procedures:  Acute abdominal series 08/03/2015    Antibiotics:  None  HPI/Subjective: Patient denies any shortness of breath. Patient complaining of some chest discomfort with eating.  Objective: Filed Vitals:   08/14/15 1244  BP: 127/74  Pulse: 69  Temp: 98.6 F (37 C)  Resp: 20    Intake/Output Summary (Last 24 hours) at 08/14/15 1341 Last data  filed at 08/14/15 0900  Gross per 24 hour  Intake   1040 ml  Output  0 ml  Net   1040 ml   Filed Weights   08/04/15 0105  Weight: 61.3 kg (135 lb 2.3 oz)    Exam:   General:  NAD  Cardiovascular: RRR  Respiratory: CTAB  Abdomen: Soft, nontender, nondistended, positive bowel sounds.  Musculoskeletal: No clubbing cyanosis or edema.  Data Reviewed: Basic Metabolic Panel:  Recent Labs Lab 08/08/15 0315 08/09/15 0981 08/10/15 0516 08/11/15 1705 08/12/15 0318 08/13/15 0437 08/14/15 0940  NA 136 139 139 141 139 138 138  K 3.9 2.9* 4.4 2.9* 3.0* 3.2* 3.8  CL 100* 103 106 106 105 103 104  CO2 25 28 22 25 25 26 26   GLUCOSE 105* 106* 99 120* 113* 116* 113*  BUN 7 <5* 6 5* <5* 5* 10  CREATININE 0.62 0.52 0.57 0.62 0.59 0.52 0.80  CALCIUM 8.9 8.4* 8.1* 8.4* 8.3* 8.5* 8.6*  MG 1.4* 1.9 1.7 1.7  --  1.7  --   PHOS  --  4.9* 4.5  --   --   --   --    Liver Function Tests:  Recent Labs Lab 08/08/15 0315 08/09/15 0712 08/10/15 0516 08/11/15 1705 08/12/15 0318  AST 278* 115* 109* 87* 84*  ALT 87* 64* 59* 51 49  ALKPHOS 181* 158* 148* 148* 146*  BILITOT 1.3* 1.1 1.0 0.5 0.5  PROT 6.8 6.5 5.9* 6.0* 6.0*  ALBUMIN 2.9* 2.6* 2.4* 2.4* 2.5*   No results for input(s): LIPASE, AMYLASE in the last 168 hours.  Recent Labs Lab 08/08/15 1149 08/13/15 0547  AMMONIA 37* 61*   CBC:  Recent Labs Lab 08/08/15 0315 08/09/15 0712 08/10/15 0516 08/12/15 0318  WBC 5.2 5.9 6.4 4.1  NEUTROABS 3.6 3.6  --   --   HGB 10.2* 10.0* 10.4* 9.2*  HCT 31.1* 30.6* 32.3* 28.5*  MCV 101.0* 101.3* 100.9* 99.3  PLT 173 202 212 248   Cardiac Enzymes: No results for input(s): CKTOTAL, CKMB, CKMBINDEX, TROPONINI in the last 168 hours. BNP (last 3 results)  Recent Labs  03/18/15 2109 08/03/15 1820  BNP 234.0* 55.1    ProBNP (last 3 results) No results for input(s): PROBNP in the last 8760 hours.  CBG:  Recent Labs Lab 08/07/15 2331 08/08/15 0743 08/08/15 1207  08/08/15 1536 08/08/15 2214  GLUCAP 103* 100* 110* 111* 109*    No results found for this or any previous visit (from the past 240 hour(s)).   Studies: No results found.  Scheduled Meds: . antiseptic oral rinse  7 mL Mouth Rinse q12n4p  . budesonide-formoterol  2 puff Inhalation BID  . chlorhexidine  15 mL Mouth Rinse BID  . DULoxetine  60 mg Oral Daily  . feeding supplement  1 Container Oral TID BM  . folic acid  1 mg Oral Daily  . gi cocktail  30 mL Oral Once  . heparin  5,000 Units Subcutaneous 3 times per day  . multivitamin with minerals  1 tablet Oral Daily  . nicotine  21 mg Transdermal Daily  . QUEtiapine  25 mg Oral BID  . sodium chloride  3 mL Intravenous Q12H  . thiamine IV  500 mg Intravenous TID  . tiotropium  18 mcg Inhalation Daily   Continuous Infusions: . dextrose 5 % and 0.9% NaCl 75 mL/hr at 08/11/15 1914    Principal Problem:   Alcohol dependence Active Problems:   Alcohol withdrawal   Protein-calorie malnutrition, severe   COPD exacerbation   Hyponatremia   Hyperglycemia   Hypokalemia  Acute respiratory failure with hypoxia   Severe protein-calorie malnutrition   Hepatitis C antibody test positive   Elevated LFTs   Hyperkalemia   Hypomagnesemia   Protein-calorie malnutrition    Time spent: 35 minutes    THOMPSON,DANIEL M.D. [facility] Pager [phone].. If 7PM-7AM, please contact night-coverage at [email or web address], password [facility] 08/14/2015, 1:41 PM  LOS: 11 days

## 2015-08-15 DIAGNOSIS — F1020 Alcohol dependence, uncomplicated: Secondary | ICD-10-CM

## 2015-08-15 LAB — URINE CULTURE
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
## 2015-08-15 NOTE — Clinical Social Work Psych Note (Addendum)
11:27am 08/16/2015-  Patient has ambulated independently with PT and currently does not meet criteria for SNF placement.  Patient is not homicidal, suicidal or psychotic and does not meet criteria for inpatient psychiatric hospitalization.  Psychiatrist recommends ETOH treatment at time of discharge.  Patient remains Network engineer (IVC'd).  Psych CSW will initiate [facility] ([facility]) referral.  Disposition: [facility] referral initiated   12:06pm- Disposition: Skilled Facility- letter of guarantee- extended search completed (under review with [facility] and [facility]) Capacity: Patient does not have capacity to make her own decisions for placement at this time. Leota Jacobsen, is agreeable to SNF placement  Vickii Penna, LCSW [phone]  Psychiatric & Orthopedics (5N 1-8) Clinical Social Worker

## 2015-08-15 NOTE — Progress Notes (Signed)
PT Cancellation Note  Patient Details Name: Rhonda Mitchell MRN: [id number] DOB: [birth year]   Cancelled Treatment:    Reason Eval/Treat Not Completed: Patient declined, no reason specified Pt politely declined therapy this PM due to being sleepy and hurting. Stated she walker earlier with sitter. Agreeable to walking later with sitter as well. Will follow up next available time.   Blake Divine A Charmion Hapke 08/15/2015, 2:24 PM Mylo Red, PT, DPT [phone]
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
## 2015-08-15 NOTE — Progress Notes (Signed)
[facility] PROGRESS NOTE  Rhonda Mitchell RUE:[id number] DOB: September 22, 1961 DOA: 08/03/2015 PCP: [name], MD  Brief history 54 year old female with a history of alcohol abuse (1 pint vodka daily), tobacco abuse, COPD (non-oxygen dependent), hepatitis C presented with shortness of breath for several days prior to admission. The patient was encephalopathic/obtunded and unable to provide history in the emergency department. She was found to be hypoxic and placed on nonrebreather. She continued to have desaturation into the 70s despite albuterol nebulizers. Alcohol level was 436 at the time of admission. It was thought that her respiratory failure was likely due to respiratory depression from alcohol intoxication. On the morning of 08/07/2015, the patient became increasingly agitated requiring additional doses of Ativan. On 08/08/2015, critical care medicine was consulted due to continued agitation and hallucinations. Fortunately, the patient improved without the use of Precedex. She was transferred back to the medical floor on 08/10/2015. Since then, the patient has been lucid, alert and oriented, but has bouts of impulsiveness.  Assessment/Plan:  Acute respiratory failure with hypoxia due to hypoventilation and respiratory depression from alcoholic intoxication: - Symbicort 80-4 0.5 BID -Spiriva daily - Titrate O2 to maintain SPO2 88 -93%  -presently stable on RA 97-99%  Alcohol dependence/ Alcohol withdrawal/ Delirium -On admission alcohol 436 -UDS--had benzodiazepines and opiates -8/18 RASS score+ 2 to +4 --transferred to ICU -Continue on CIWA protocol -Fortunately did not require Precedex, transferred back to medical floor 08/10/2015 -08/12/2015--patient is alert and aware of her surroundings, but has bursts of agitation and impulsivity -Obtained capacity evaluation and per psychiatry patient without capacity. -?? Wernicke's encephalopathy -Started thiamine 500 mg IV every 8  hours 6 doses--pt refusing it IV--will try to give po -UA and culture nitrite negative small leukocytes. Urine culture with multiple bacterial morphotypes. -check ammonia--61--will not treat as pt is awake and alert and conversant -08/13/15--Dr Tat spoke with the patient's significant other. He stated that the patient is at her baseline where she has episodes of angry outbursts. He feels that she has underlying psychiatric disorder  -08/13/2015--psychiatry consulted--feels that patient needs inpatient psych admission--pt will s/p IVC as she presently refused Patient currently on Seroquel and Cymbalta per psychiatry. -case discussed with Dr. Elsie Saas per Dr Tat. -PT IS MEDICALLY STABLE TO BE TRANSFERRED TO INPATIENT PSYCHIATRY  Tobacco abuse -Continue Nicoderm patch -still smoking--No desire to quit  Hyperglycemia - 8/14 Hemoglobin A1c= 5.6   Hypomagnesemia/Hypokalemia -repleted  Protein-calorie malnutrition, severe - Patient has been seen by nutrition. Continue current supplementation with boost breeze.   Hyponatremia - Improved with hydration.  Elevated LFTs: -Likely due to alcohol abuse -LFTs trending down.   Macrocytic anemia: -Anemia panel unrevealing  -Likely due to continued alcohol use -B12 and folate are WNL  Infectious/Chronic Hep C without Coma -HIV negative -RPR negative -Acute hepatitis panel positive hepatitis C virus antibody -HCV Quant viral load = 285,000 -Outpatient follow-up.  COPD Continue Spiriva and Symbicort.    Code Status: Full Family Communication: Updated patient. No family at bedside. Disposition Plan: Inpatient psychiatric unit when bed available.   Consultants:  Psychiatry: Dr. Elsie Saas 08/13/2015  PCCCM : Dr. Vassie Loll 08/08/2015  Procedures:  Acute abdominal series 08/03/2015    Antibiotics:  None  HPI/Subjective: Patient denies SOB. No CP. Patient asking when she's going to inpatient  psychiatry.  Objective: Filed Vitals:   08/15/15 1300  BP: 104/55  Pulse: 70  Temp: 97.8 F (36.6 C)  Resp: 18    Intake/Output Summary (Last 24 hours) at 08/15/15 1415 Last data filed  at 08/15/15 1135  Gross per 24 hour  Intake    840 ml  Output   1400 ml  Net   -560 ml   Filed Weights   08/04/15 0105  Weight: 61.3 kg (135 lb 2.3 oz)    Exam:   General:  NAD  Cardiovascular: RRR  Respiratory: CTAB  Abdomen: Soft, nontender, nondistended, positive bowel sounds.  Musculoskeletal: No clubbing cyanosis or edema.  Data Reviewed: Basic Metabolic Panel:  Recent Labs Lab 08/09/15 0712 08/10/15 0516 08/11/15 1705 08/12/15 0318 08/13/15 0437 08/14/15 0940  NA 139 139 141 139 138 138  K 2.9* 4.4 2.9* 3.0* 3.2* 3.8  CL 103 106 106 105 103 104  CO2 28 22 25 25 26 26   GLUCOSE 106* 99 120* 113* 116* 113*  BUN <5* 6 5* <5* 5* 10  CREATININE 0.52 0.57 0.62 0.59 0.52 0.80  CALCIUM 8.4* 8.1* 8.4* 8.3* 8.5* 8.6*  MG 1.9 1.7 1.7  --  1.7  --   PHOS 4.9* 4.5  --   --   --   --    Liver Function Tests:  Recent Labs Lab 08/09/15 0712 08/10/15 0516 08/11/15 1705 08/12/15 0318  AST 115* 109* 87* 84*  ALT 64* 59* 51 49  ALKPHOS 158* 148* 148* 146*  BILITOT 1.1 1.0 0.5 0.5  PROT 6.5 5.9* 6.0* 6.0*  ALBUMIN 2.6* 2.4* 2.4* 2.5*   No results for input(s): LIPASE, AMYLASE in the last 168 hours.  Recent Labs Lab 08/13/15 0547  AMMONIA 61*   CBC:  Recent Labs Lab 08/09/15 0712 08/10/15 0516 08/12/15 0318  WBC 5.9 6.4 4.1  NEUTROABS 3.6  --   --   HGB 10.0* 10.4* 9.2*  HCT 30.6* 32.3* 28.5*  MCV 101.3* 100.9* 99.3  PLT 202 212 248   Cardiac Enzymes: No results for input(s): CKTOTAL, CKMB, CKMBINDEX, TROPONINI in the last 168 hours. BNP (last 3 results)  Recent Labs  03/18/15 2109 08/03/15 1820  BNP 234.0* 55.1    ProBNP (last 3 results) No results for input(s): PROBNP in the last 8760 hours.  CBG:  Recent Labs Lab 08/08/15 1536  08/08/15 2214  GLUCAP 111* 109*    Recent Results (from the past 240 hour(s))  Urine culture     Status: None   Collection Time: 08/14/15 11:40 AM  Result Value Ref Range Status   Specimen Description URINE, CLEAN CATCH  Final   Special Requests NONE  Final   Culture MULTIPLE SPECIES PRESENT, SUGGEST RECOLLECTION  Final   Report Status 08/15/2015 FINAL  Final     Studies: No results found.  Scheduled Meds: . budesonide-formoterol  2 puff Inhalation BID  . DULoxetine  60 mg Oral Daily  . feeding supplement  1 Container Oral TID BM  . folic acid  1 mg Oral Daily  . heparin  5,000 Units Subcutaneous 3 times per day  . multivitamin with minerals  1 tablet Oral Daily  . nicotine  21 mg Transdermal Daily  . QUEtiapine  25 mg Oral BID  . sodium chloride  3 mL Intravenous Q12H  . tiotropium  18 mcg Inhalation Daily   Continuous Infusions:    Principal Problem:   Alcohol dependence Active Problems:   Alcohol withdrawal   Protein-calorie malnutrition, severe   COPD exacerbation   Hyponatremia   Hyperglycemia   Hypokalemia   Acute respiratory failure with hypoxia   Severe protein-calorie malnutrition   Hepatitis C antibody test positive   Elevated LFTs  Hyperkalemia   Hypomagnesemia   Protein-calorie malnutrition    Time spent: 35 minutes    THOMPSON,DANIEL M.D. [facility] Pager [phone].. If 7PM-7AM, please contact night-coverage at [email or web address], password [facility] 08/15/2015, 2:15 PM  LOS: 12 days

## 2015-08-16 LAB — COMPREHENSIVE METABOLIC PANEL
ALK PHOS: 107 U/L (ref 38–126)
ALT: 39 U/L (ref 14–54)
AST: 53 U/L — AB (ref 15–41)
Albumin: 2.7 g/dL — ABNORMAL LOW (ref 3.5–5.0)
Anion gap: 9 (ref 5–15)
BUN: 6 mg/dL (ref 6–20)
CALCIUM: 8.6 mg/dL — AB (ref 8.9–10.3)
CHLORIDE: 104 mmol/L (ref 101–111)
CO2: 26 mmol/L (ref 22–32)
CREATININE: 0.57 mg/dL (ref 0.44–1.00)
GFR calc non Af Amer: >60 mL/min (ref >60)
Glucose, Bld: 124 mg/dL — ABNORMAL HIGH (ref 65–99)
Potassium: 3.6 mmol/L (ref 3.5–5.1)
SODIUM: 139 mmol/L (ref 135–145)
Total Bilirubin: 0.2 mg/dL — ABNORMAL LOW (ref 0.3–1.2)
Total Protein: 6.4 g/dL — ABNORMAL LOW (ref 6.5–8.1)

## 2015-08-16 LAB — MAGNESIUM: Magnesium: 1.8 mg/dL (ref 1.7–2.4)

## 2015-08-16 MED ORDER — PANTOPRAZOLE SODIUM 40 MG PO TBEC
40.0000 mg | DELAYED_RELEASE_TABLET | Freq: Every day | ORAL | Status: DC
  Administered 2015-08-18 – 2015-08-28 (×8): 40 mg via ORAL
  Filled 2015-08-16 (×11): qty 1

## 2015-08-16 MED ORDER — TRAMADOL HCL 50 MG PO TABS
50.0000 mg | ORAL_TABLET | Freq: Four times a day (QID) | ORAL | Status: DC | PRN
  Administered 2015-08-16 – 2015-08-26 (×13): 50 mg via ORAL
  Filled 2015-08-16 (×13): qty 1

## 2015-08-16 MED ORDER — ONDANSETRON 4 MG PO TBDP
4.0000 mg | ORAL_TABLET | Freq: Three times a day (TID) | ORAL | Status: DC | PRN
  Administered 2015-08-16: 4 mg via ORAL
  Filled 2015-08-16: qty 1

## 2015-08-16 NOTE — Progress Notes (Signed)
Nutrition Follow-up  DOCUMENTATION CODES:   Not applicable  INTERVENTION:   -Continue Boost Breeze po TID, each supplement provides 250 kcal and 9 grams of protein  NUTRITION DIAGNOSIS:   Increased nutrient needs related to chronic illness as evidenced by estimated needs.  Ongoing  GOAL:   Patient will meet greater than or equal to 90% of their needs  Progressing  MONITOR:   PO intake, Supplement acceptance, Labs, Weight trends, Skin, I & O's  REASON FOR ASSESSMENT:   Consult Assessment of nutrition requirement/status  ASSESSMENT:   Deedra Pro is a 54 y.o. female past medical history of alcohol abuse ongoing tobacco abuse and COPD non-oxygen dependent at comes in for shortness of breath that started several days prior to admission. The patient is sleepy and cannot provide a history so most of the history is obtained from the chart. As per chart the patient had been short of breath last several days. She denies any cough or fever.  Pt remains on a regular diet. Intake continues to be variable, but improving; PO: 25-100%.   Per MAR, but is accepting Boost Breeze supplements, however, noted some refusals earlier this week.   Per staff, mentation has improved. She walked with PT earlier today; pt CSW notes, pt no longer qualifies for SNF placement.   Psych and CSW continue to follow. Pt remains IVC'd. CSW has completed [facility] application. Pt is stable to transfer to inpatient psychiatric facility once bed is available.   Labs reviewed.   Diet Order:  Diet regular Room service appropriate?: Yes; Fluid consistency:: Thin  Skin:  Reviewed, no issues  Last BM:  08/16/15  Height:   Ht Readings from Last 1 Encounters:  08/04/15 5' (1.524 m)    Weight:   Wt Readings from Last 1 Encounters:  08/04/15 135 lb 2.3 oz (61.3 kg)    Ideal Body Weight:  45.5 kg  BMI:  Body mass index is 26.39 kg/(m^2).  Estimated Nutritional Needs:   Kcal:  1500-1700  Protein:   70-80 grams  Fluid:  1.5-1.7 L  EDUCATION NEEDS:   No education needs identified at this time  Victory Strollo A. Mayford Knife, RD, LDN, CDE Pager: [phone] After hours Pager: [phone]

## 2015-08-16 NOTE — Progress Notes (Signed)
[facility] PROGRESS NOTE  Rhonda Mitchell ZOX:[id number] DOB: [birth year] DOA: 08/03/2015 PCP: [name], MD  Brief history 54 year old female with a history of alcohol abuse (1 pint vodka daily), tobacco abuse, COPD (non-oxygen dependent), hepatitis C presented with shortness of breath for several days prior to admission. The patient was encephalopathic/obtunded and unable to provide history in the emergency department. She was found to be hypoxic and placed on nonrebreather. She continued to have desaturation into the 70s despite albuterol nebulizers. Alcohol level was 436 at the time of admission. It was thought that her respiratory failure was likely due to respiratory depression from alcohol intoxication. On the morning of 08/07/2015, the patient became increasingly agitated requiring additional doses of Ativan. On 08/08/2015, critical care medicine was consulted due to continued agitation and hallucinations. Fortunately, the patient improved without the use of Precedex. She was transferred back to the medical floor on 08/10/2015. Since then, the patient has been lucid, alert and oriented, but has bouts of impulsiveness.  Assessment/Plan:  Acute respiratory failure with hypoxia due to hypoventilation and respiratory depression from alcoholic intoxication: - Symbicort 80-4 0.5 BID -Spiriva daily - Titrate O2 to maintain SPO2 88 -93%  -presently stable on RA 97-99%  Alcohol dependence/ Alcohol withdrawal/ Delirium -On admission alcohol 436 -UDS--had benzodiazepines and opiates -8/18 RASS score+ 2 to +4 --transferred to ICU -Continue on CIWA protocol -Fortunately did not require Precedex, transferred back to medical floor 08/10/2015 -08/12/2015--patient is alert and aware of her surroundings, but has bursts of agitation and impulsivity -Obtained capacity evaluation and per psychiatry patient without capacity. -?? Wernicke's encephalopathy -Started thiamine 500 mg IV every 8  hours 6 doses--pt refusing it IV--will try to give po -UA and culture nitrite negative small leukocytes. Urine culture with multiple bacterial morphotypes. -check ammonia--61--will not treat as pt is awake and alert and conversant -08/13/15--Dr Tat spoke with the patient's significant other. He stated that the patient is at her baseline where she has episodes of angry outbursts. He feels that she has underlying psychiatric disorder  -08/13/2015--psychiatry consulted--feels that patient needs inpatient psych admission--pt will s/p IVC as she presently refused Patient currently on Seroquel and Cymbalta per psychiatry. -case discussed with Dr. Elsie Saas per Dr Tat. -PT IS MEDICALLY STABLE TO BE TRANSFERRED TO INPATIENT PSYCHIATRY  Tobacco abuse -Continue Nicoderm patch -still smoking--No desire to quit  Hyperglycemia - 8/14 Hemoglobin A1c= 5.6   Hypomagnesemia/Hypokalemia -repleted  Protein-calorie malnutrition, severe - Patient has been seen by nutrition. Continue current supplementation with boost breeze.   Hyponatremia - Improved with hydration.  Elevated LFTs: -Likely due to alcohol abuse -LFTs trending down.   Macrocytic anemia: -Anemia panel unrevealing  -Likely due to continued alcohol use -B12 and folate are WNL  Infectious/Chronic Hep C without Coma -HIV negative -RPR negative -Acute hepatitis panel positive hepatitis C virus antibody -HCV Quant viral load = 285,000 -Outpatient follow-up.  COPD Continue Spiriva and Symbicort.    Code Status: Full Family Communication: Updated patient. No family at bedside. Disposition Plan: Inpatient psychiatric unit when bed available.   Consultants:  Psychiatry: Dr. Elsie Saas 08/13/2015  PCCCM : Dr. Vassie Loll 08/08/2015  Procedures:  Acute abdominal series 08/03/2015    Antibiotics:  None  HPI/Subjective: Patient denies SOB. No CP. Patient c/o pain all over.  Objective: Filed Vitals:   08/16/15  0839  BP:   Pulse: 76  Temp:   Resp:     Intake/Output Summary (Last 24 hours) at 08/16/15 1418 Last data filed at 08/16/15 1100  Gross per  24 hour  Intake    420 ml  Output      0 ml  Net    420 ml   Filed Weights   08/04/15 0105  Weight: 61.3 kg (135 lb 2.3 oz)    Exam:   General:  NAD  Cardiovascular: RRR  Respiratory: CTAB  Abdomen: Soft, nontender, nondistended, positive bowel sounds.  Musculoskeletal: No clubbing cyanosis or edema.  Data Reviewed: Basic Metabolic Panel:  Recent Labs Lab 08/10/15 0516 08/11/15 1705 08/12/15 0318 08/13/15 0437 08/14/15 0940 08/16/15 0350  NA 139 141 139 138 138 139  K 4.4 2.9* 3.0* 3.2* 3.8 3.6  CL 106 106 105 103 104 104  CO2 22 25 25 26 26 26   GLUCOSE 99 120* 113* 116* 113* 124*  BUN 6 5* <5* 5* 10 6  CREATININE 0.57 0.62 0.59 0.52 0.80 0.57  CALCIUM 8.1* 8.4* 8.3* 8.5* 8.6* 8.6*  MG 1.7 1.7  --  1.7  --  1.8  PHOS 4.5  --   --   --   --   --    Liver Function Tests:  Recent Labs Lab 08/10/15 0516 08/11/15 1705 08/12/15 0318 08/16/15 0350  AST 109* 87* 84* 53*  ALT 59* 51 49 39  ALKPHOS 148* 148* 146* 107  BILITOT 1.0 0.5 0.5 0.2*  PROT 5.9* 6.0* 6.0* 6.4*  ALBUMIN 2.4* 2.4* 2.5* 2.7*   No results for input(s): LIPASE, AMYLASE in the last 168 hours.  Recent Labs Lab 08/13/15 0547  AMMONIA 61*   CBC:  Recent Labs Lab 08/10/15 0516 08/12/15 0318  WBC 6.4 4.1  HGB 10.4* 9.2*  HCT 32.3* 28.5*  MCV 100.9* 99.3  PLT 212 248   Cardiac Enzymes: No results for input(s): CKTOTAL, CKMB, CKMBINDEX, TROPONINI in the last 168 hours. BNP (last 3 results)  Recent Labs  03/18/15 2109 08/03/15 1820  BNP 234.0* 55.1    ProBNP (last 3 results) No results for input(s): PROBNP in the last 8760 hours.  CBG: No results for input(s): GLUCAP in the last 168 hours.  Recent Results (from the past 240 hour(s))  Urine culture     Status: None   Collection Time: 08/14/15 11:40 AM  Result Value Ref  Range Status   Specimen Description URINE, CLEAN CATCH  Final   Special Requests NONE  Final   Culture MULTIPLE SPECIES PRESENT, SUGGEST RECOLLECTION  Final   Report Status 08/15/2015 FINAL  Final     Studies: No results found.  Scheduled Meds: . budesonide-formoterol  2 puff Inhalation BID  . DULoxetine  60 mg Oral Daily  . feeding supplement  1 Container Oral TID BM  . folic acid  1 mg Oral Daily  . heparin  5,000 Units Subcutaneous 3 times per day  . multivitamin with minerals  1 tablet Oral Daily  . nicotine  21 mg Transdermal Daily  . QUEtiapine  25 mg Oral BID  . sodium chloride  3 mL Intravenous Q12H  . tiotropium  18 mcg Inhalation Daily   Continuous Infusions:    Principal Problem:   Alcohol dependence Active Problems:   Alcohol withdrawal   Protein-calorie malnutrition, severe   COPD exacerbation   Hyponatremia   Hyperglycemia   Hypokalemia   Acute respiratory failure with hypoxia   Severe protein-calorie malnutrition   Hepatitis C antibody test positive   Elevated LFTs   Hyperkalemia   Hypomagnesemia   Protein-calorie malnutrition    Time spent: 35 minutes    Katria Botts  M.D. [facility] Pager [phone].. If 7PM-7AM, please contact night-coverage at [email or web address], password [facility] 08/16/2015, 2:18 PM  LOS: 13 days
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
## 2015-08-16 NOTE — Progress Notes (Signed)
Physical Therapy Treatment Patient Details Name: Rhonda Mitchell MRN: [id number] DOB: [birth year] Today's Date: 08/16/2015    History of Present Illness 54 year old female with a history of alcohol abuse (1 pint vodka daily), tobacco abuse, COPD (non-oxygen dependent), hepatitis C presented with shortness of breath for several days prior to admission. The patient was encephalopathic/obtunded and unable to provide history in the emergency department. She was found to be hypoxic and placed on nonrebreather. She continued to have desaturation into the 70s despite albuterol nebulizers. Alcohol level was 436 at the time of admission. It was thought that her respiratory failure was likely due to respiratory depression from alcohol intoxication.  Pt also with PMH of chronic hepatitis C.     PT Comments    Pt is improving with mobility. She walked 800' without an assistive device, with min/guard assist for safety. She failed to negotiate 1 obstacle, which is a significant improvement from last PT session. She did not have any loss of balance. Cognitive status seems improved, though she asked if she was being discharged to [place].    Follow Up Recommendations  SNF;Supervision/Assistance - 24 hour     Equipment Recommendations  None recommended by PT    Recommendations for Other Services       Precautions / Restrictions Precautions Precautions: Fall Restrictions Weight Bearing Restrictions: No    Mobility  Bed Mobility Overal bed mobility: Modified Independent Bed Mobility: Supine to Sit     Supine to sit: Modified independent (Device/Increase time)     General bed mobility comments: with rail  Transfers Overall transfer level: Needs assistance Equipment used: None Transfers: Sit to/from Stand Sit to Stand: Supervision         General transfer comment: supervision for safety  Ambulation/Gait Ambulation/Gait assistance: Min guard Ambulation Distance (Feet): 800  Feet Assistive device: None Gait Pattern/deviations: Step-through pattern;Decreased step length - left;Decreased step length - right   Gait velocity interpretation: at or above normal speed for age/gender General Gait Details: steady without AD, pt stated she's been walking in halls with sitter without AD, bumped into nursing station counter 1x otherwise was able to negotiate obstacles, no LOB   Stairs            Wheelchair Mobility    Modified Rankin (Stroke Patients Only)       Balance Overall balance assessment: History of Falls   Sitting balance-Leahy Scale: Good       Standing balance-Leahy Scale: Good Standing balance comment: pt bent down to floor to pick up her slippers               High Level Balance Comments: Pt reports 15 falls in last 6 months    Cognition Arousal/Alertness: Awake/alert Behavior During Therapy: Impulsive Overall Cognitive Status: No family/caregiver present to determine baseline cognitive functioning (cognitive status improved from eval, able to follow commands, no family present to determine baseline) Area of Impairment: Safety/judgement;Problem solving       Following Commands: Follows one step commands consistently       General Comments: pt asked if she was being discharged to [place] (where she is from originally), was able to state that she is in [facility]    Exercises      General Comments        Pertinent Vitals/Pain Pain Assessment: No/denies pain    Home Living                      Prior Function  PT Goals (current goals can now be found in the care plan section) Acute Rehab PT Goals Patient Stated Goal: go home today PT Goal Formulation: With patient Time For Goal Achievement: 08/26/15 Potential to Achieve Goals: Good    Frequency  Min 3X/week    PT Plan Current plan remains appropriate    Co-evaluation             End of Session Equipment Utilized During Treatment:  Gait belt Activity Tolerance: Patient tolerated treatment well Patient left: in chair;with call bell/phone within reach;with nursing/sitter in room     Time: 0915-0932 PT Time Calculation (min) (ACUTE ONLY): 17 min  Charges:  $Gait Training: 8-22 mins                    G Codes:      Tamala Ser 08/16/2015, 9:41 AM [phone]

## 2015-08-17 MED ORDER — HALOPERIDOL LACTATE 5 MG/ML IJ SOLN
10.0000 mg | Freq: Once | INTRAMUSCULAR | Status: AC
  Administered 2015-08-17: 10 mg via INTRAVENOUS
  Filled 2015-08-17: qty 2

## 2015-08-17 MED ORDER — HALOPERIDOL LACTATE 5 MG/ML IJ SOLN
5.0000 mg | Freq: Once | INTRAMUSCULAR | Status: AC
  Administered 2015-08-17: 5 mg via INTRAVENOUS
  Filled 2015-08-17: qty 1

## 2015-08-17 NOTE — Progress Notes (Signed)
[facility] PROGRESS NOTE  Rhonda Mitchell AVW:[id number] DOB: 1961/12/07 DOA: 08/03/2015 PCP: [name], MD  Brief history 54 year old female with a history of alcohol abuse (1 pint vodka daily), tobacco abuse, COPD (non-oxygen dependent), hepatitis C presented with shortness of breath for several days prior to admission. The patient was encephalopathic/obtunded and unable to provide history in the emergency department. She was found to be hypoxic and placed on nonrebreather. She continued to have desaturation into the 70s despite albuterol nebulizers. Alcohol level was 436 at the time of admission. It was thought that her respiratory failure was likely due to respiratory depression from alcohol intoxication. On the morning of 08/07/2015, the patient became increasingly agitated requiring additional doses of Ativan. On 08/08/2015, critical care medicine was consulted due to continued agitation and hallucinations. Fortunately, the patient improved without the use of Precedex. She was transferred back to the medical floor on 08/10/2015. Since then, the patient has been lucid, alert and oriented, but has bouts of impulsiveness.  Assessment/Plan:  Acute respiratory failure with hypoxia due to hypoventilation and respiratory depression from alcoholic intoxication: - Symbicort 80-4 0.5 BID -Spiriva daily - Titrate O2 to maintain SPO2 88 -93%  -presently stable on RA 97-99%  Alcohol dependence/ Alcohol withdrawal/ Delirium -On admission alcohol 436 -UDS--had benzodiazepines and opiates -8/18 RASS score+ 2 to +4 --transferred to ICU -Continue on CIWA protocol -Fortunately did not require Precedex, transferred back to medical floor 08/10/2015 -08/12/2015--patient is alert and aware of her surroundings, but has bursts of agitation and impulsivity -Obtained capacity evaluation and per psychiatry patient without capacity. -?? Wernicke's encephalopathy -Started thiamine 500 mg IV every 8  hours 6 doses--pt refusing it IV--will try to give po -UA and culture nitrite negative small leukocytes. Urine culture with multiple bacterial morphotypes. -check ammonia--61--will not treat as pt is awake and alert and conversant -08/13/15--Dr Tat spoke with the patient's significant other. He stated that the patient is at her baseline where she has episodes of angry outbursts. He feels that she has underlying psychiatric disorder  -08/13/2015--psychiatry consulted--feels that patient initially needed inpatient psych admission--pt will s/p IVC as she presently refused. Psychiatry reassessed the patient and at this point in time feels that patient does not meet criteria for psychiatric inpatient admission and recommending patient to be referred to [facility] for chemical dependency. Patient currently on Seroquel and Cymbalta per psychiatry. -case discussed with Dr. Elsie Saas per Dr Tat. -PT IS MEDICALLY STABLE TO BE TRANSFERRED TO [facility] WHEN BED AVAILABLE.   Tobacco abuse -Continue Nicoderm patch -still smoking--No desire to quit  Hyperglycemia - 8/14 Hemoglobin A1c= 5.6   Hypomagnesemia/Hypokalemia -repleted  Protein-calorie malnutrition, severe - Patient has been seen by nutrition. Continue current supplementation with boost breeze.   Hyponatremia - Improved with hydration.  Elevated LFTs: -Likely due to alcohol abuse -LFTs trending down.   Macrocytic anemia: -Anemia panel unrevealing  -Likely due to continued alcohol use -B12 and folate are WNL  Infectious/Chronic Hep C without Coma -HIV negative -RPR negative -Acute hepatitis panel positive hepatitis C virus antibody -HCV Quant viral load = 285,000 -Outpatient follow-up.  COPD Continue Spiriva and Symbicort.    Code Status: Full Family Communication: Updated patient. No family at bedside. Disposition Plan: Awaiting bed for referral to [facility] for chemical dependency per recommendations of psychiatry  recommendations.   Consultants:  Psychiatry: Dr. Elsie Saas 08/13/2015  PCCCM : Dr. Vassie Loll 08/08/2015  Procedures:  Acute abdominal series 08/03/2015    Antibiotics:  None  HPI/Subjective: Patient sleeping. Events overnight noted. Sitter  at bedside.  Objective: Filed Vitals:   08/16/15 2052  BP: 144/78  Pulse: 75  Temp: 99 F (37.2 C)  Resp:     Intake/Output Summary (Last 24 hours) at 08/17/15 1326 Last data filed at 08/16/15 1700  Gross per 24 hour  Intake    480 ml  Output      0 ml  Net    480 ml   Filed Weights   08/04/15 0105  Weight: 61.3 kg (135 lb 2.3 oz)    Exam:   General:  NAD  Cardiovascular: RRR  Respiratory: CTAB  Abdomen: Soft, nontender, nondistended, positive bowel sounds.  Musculoskeletal: No clubbing cyanosis or edema.  Data Reviewed: Basic Metabolic Panel:  Recent Labs Lab 08/11/15 1705 08/12/15 0318 08/13/15 0437 08/14/15 0940 08/16/15 0350  NA 141 139 138 138 139  K 2.9* 3.0* 3.2* 3.8 3.6  CL 106 105 103 104 104  CO2 25 25 26 26 26   GLUCOSE 120* 113* 116* 113* 124*  BUN 5* <5* 5* 10 6  CREATININE 0.62 0.59 0.52 0.80 0.57  CALCIUM 8.4* 8.3* 8.5* 8.6* 8.6*  MG 1.7  --  1.7  --  1.8   Liver Function Tests:  Recent Labs Lab 08/11/15 1705 08/12/15 0318 08/16/15 0350  AST 87* 84* 53*  ALT 51 49 39  ALKPHOS 148* 146* 107  BILITOT 0.5 0.5 0.2*  PROT 6.0* 6.0* 6.4*  ALBUMIN 2.4* 2.5* 2.7*   No results for input(s): LIPASE, AMYLASE in the last 168 hours.  Recent Labs Lab 08/13/15 0547  AMMONIA 61*   CBC:  Recent Labs Lab 08/12/15 0318  WBC 4.1  HGB 9.2*  HCT 28.5*  MCV 99.3  PLT 248   Cardiac Enzymes: No results for input(s): CKTOTAL, CKMB, CKMBINDEX, TROPONINI in the last 168 hours. BNP (last 3 results)  Recent Labs  03/18/15 2109 08/03/15 1820  BNP 234.0* 55.1    ProBNP (last 3 results) No results for input(s): PROBNP in the last 8760 hours.  CBG: No results for input(s): GLUCAP  in the last 168 hours.  Recent Results (from the past 240 hour(s))  Urine culture     Status: None   Collection Time: 08/14/15 11:40 AM  Result Value Ref Range Status   Specimen Description URINE, CLEAN CATCH  Final   Special Requests NONE  Final   Culture MULTIPLE SPECIES PRESENT, SUGGEST RECOLLECTION  Final   Report Status 08/15/2015 FINAL  Final     Studies: No results found.  Scheduled Meds: . budesonide-formoterol  2 puff Inhalation BID  . DULoxetine  60 mg Oral Daily  . feeding supplement  1 Container Oral TID BM  . folic acid  1 mg Oral Daily  . heparin  5,000 Units Subcutaneous 3 times per day  . multivitamin with minerals  1 tablet Oral Daily  . nicotine  21 mg Transdermal Daily  . pantoprazole  40 mg Oral Q0600  . QUEtiapine  25 mg Oral BID  . sodium chloride  3 mL Intravenous Q12H  . tiotropium  18 mcg Inhalation Daily   Continuous Infusions:    Principal Problem:   Alcohol dependence Active Problems:   Alcohol withdrawal   Protein-calorie malnutrition, severe   COPD exacerbation   Hyponatremia   Hyperglycemia   Hypokalemia   Acute respiratory failure with hypoxia   Severe protein-calorie malnutrition   Hepatitis C antibody test positive   Elevated LFTs   Hyperkalemia   Hypomagnesemia   Protein-calorie malnutrition  Time spent: 35 minutes    Rhonda Mitchell M.D. [facility] Pager [phone].. If 7PM-7AM, please contact night-coverage at [email or web address], password [facility] 08/17/2015, 1:26 PM  LOS: 14 days

## 2015-08-17 NOTE — Progress Notes (Signed)
Disposition CSW contacted [facility] who confirmed that referral has been made and the patient is under medical review.  Seward Speck [facility] Disposition CSW [phone]

## 2015-08-17 NOTE — Progress Notes (Signed)
Patient has been anxious, combative, and hallucinating tonight, even after 4 mg of ativan every 2 hours. Physician called 5 mg of haldol ordered as 1 time dose. New IV was started on patient about 0015 due to patient pulling out IV on day shift. Patients condition remained unchanged. IV infiltrated about 0315 IV team consult put in. IV team stated they would try another site however patient is a VERY difficult stick and suggested we try other avenues if we lost this one. Patient has Recruitment consultant but remains very unsteady and combative. Will continue to monitor. Cindee Salt, RN

## 2015-08-17 NOTE — Progress Notes (Signed)
Patient has stated throughout shift her needs, when I asked specific questions, are you hungry? Do you hurt anywhere or have pain?  Do you understand to call for assistance if you need to get up for any reason? When rounds were made she would ask for assistance with appropiate needs. A sitter was with her throughout the night, we did not have availability for the day shift.  At 1815 patient was found sitting on the floor near her bed, denied pain, no injury was noted upon exam, vs stable 120/78 80 98.6 18 98% RA.  Md was called.

## 2015-08-18 NOTE — Progress Notes (Signed)
[facility] PROGRESS NOTE  Rhonda Mitchell ZOX:[id number] DOB: 21-Jul-1961 DOA: 08/03/2015 PCP: [name], MD  Brief history 54 year old female with a history of alcohol abuse (1 pint vodka daily), tobacco abuse, COPD (non-oxygen dependent), hepatitis C presented with shortness of breath for several days prior to admission. The patient was encephalopathic/obtunded and unable to provide history in the emergency department. She was found to be hypoxic and placed on nonrebreather. She continued to have desaturation into the 70s despite albuterol nebulizers. Alcohol level was 436 at the time of admission. It was thought that her respiratory failure was likely due to respiratory depression from alcohol intoxication. On the morning of 08/07/2015, the patient became increasingly agitated requiring additional doses of Ativan. On 08/08/2015, critical care medicine was consulted due to continued agitation and hallucinations. Fortunately, the patient improved without the use of Precedex. She was transferred back to the medical floor on 08/10/2015. Since then, the patient has been lucid, alert and oriented, but has bouts of impulsiveness.  Assessment/Plan:  Acute respiratory failure with hypoxia due to hypoventilation and respiratory depression from alcoholic intoxication: - Symbicort 80-4 0.5 BID -Spiriva daily - Titrate O2 to maintain SPO2 88 -93%  -presently stable on RA 97-99%  Alcohol dependence/ Alcohol withdrawal/ Delirium -On admission alcohol 436 -UDS--had benzodiazepines and opiates -8/18 RASS score+ 2 to +4 --transferred to ICU -Continue on CIWA protocol -Fortunately did not require Precedex, transferred back to medical floor 08/10/2015 -08/12/2015--patient is alert and aware of her surroundings, but has bursts of agitation and impulsivity -Obtained capacity evaluation and per psychiatry patient without capacity. -?? Wernicke's encephalopathy -Started thiamine 500 mg IV every 8  hours 6 doses--pt refusing it IV--will try to give po -UA and culture nitrite negative small leukocytes. Urine culture with multiple bacterial morphotypes. -check ammonia--61--will not treat as pt is awake and alert and conversant -08/13/15--Dr Tat spoke with the patient's significant other. He stated that the patient is at her baseline where she has episodes of angry outbursts. He feels that she has underlying psychiatric disorder  -08/13/2015--psychiatry consulted--feels that patient initially needed inpatient psych admission--pt will s/p IVC as she presently refused. Psychiatry reassessed the patient and at this point in time feels that patient does not meet criteria for psychiatric inpatient admission and recommending patient to be referred to [facility] for chemical dependency. Patient currently on Seroquel and Cymbalta per psychiatry. -case discussed with Dr. Elsie Saas per Dr Tat. -PT IS MEDICALLY STABLE TO BE TRANSFERRED TO [facility] WHEN BED AVAILABLE.   Tobacco abuse -Continue Nicoderm patch -still smoking--No desire to quit  Hyperglycemia - 8/14 Hemoglobin A1c= 5.6   Hypomagnesemia/Hypokalemia -repleted  Protein-calorie malnutrition, severe - Patient has been seen by nutrition. Continue current supplementation with boost breeze.   Hyponatremia - Improved with hydration.  Elevated LFTs: -Likely due to alcohol abuse -LFTs trending down.   Macrocytic anemia: -Anemia panel unrevealing  -Likely due to continued alcohol use -B12 and folate are WNL  Infectious/Chronic Hep C without Coma -HIV negative -RPR negative -Acute hepatitis panel positive hepatitis C virus antibody -HCV Quant viral load = 285,000 -Outpatient follow-up.  COPD Continue Spiriva and Symbicort.    Code Status: Full Family Communication: Updated patient. No family at bedside. Disposition Plan: Awaiting bed for referral to [facility] for chemical dependency per recommendations of psychiatry  recommendations.   Consultants:  Psychiatry: Dr. Elsie Saas 08/13/2015  PCCCM : Dr. Vassie Loll 08/08/2015  Procedures:  Acute abdominal series 08/03/2015    Antibiotics:  None  HPI/Subjective: Patient sleeping. Boyfriend at bedside. Sitter  at bedside.  Objective: Filed Vitals:   08/18/15 0410  BP: 118/68  Pulse: 70  Temp: 98.5 F (36.9 C)  Resp: 18    Intake/Output Summary (Last 24 hours) at 08/18/15 1145 Last data filed at 08/18/15 1048  Gross per 24 hour  Intake   1636 ml  Output      0 ml  Net   1636 ml   Filed Weights   08/04/15 0105  Weight: 61.3 kg (135 lb 2.3 oz)    Exam:   General:  NAD  Cardiovascular: RRR  Respiratory: CTAB  Abdomen: Soft, nontender, nondistended, positive bowel sounds.  Musculoskeletal: No clubbing cyanosis or edema.  Data Reviewed: Basic Metabolic Panel:  Recent Labs Lab 08/11/15 1705 08/12/15 0318 08/13/15 0437 08/14/15 0940 08/16/15 0350  NA 141 139 138 138 139  K 2.9* 3.0* 3.2* 3.8 3.6  CL 106 105 103 104 104  CO2 25 25 26 26 26   GLUCOSE 120* 113* 116* 113* 124*  BUN 5* <5* 5* 10 6  CREATININE 0.62 0.59 0.52 0.80 0.57  CALCIUM 8.4* 8.3* 8.5* 8.6* 8.6*  MG 1.7  --  1.7  --  1.8   Liver Function Tests:  Recent Labs Lab 08/11/15 1705 08/12/15 0318 08/16/15 0350  AST 87* 84* 53*  ALT 51 49 39  ALKPHOS 148* 146* 107  BILITOT 0.5 0.5 0.2*  PROT 6.0* 6.0* 6.4*  ALBUMIN 2.4* 2.5* 2.7*   No results for input(s): LIPASE, AMYLASE in the last 168 hours.  Recent Labs Lab 08/13/15 0547  AMMONIA 61*   CBC:  Recent Labs Lab 08/12/15 0318  WBC 4.1  HGB 9.2*  HCT 28.5*  MCV 99.3  PLT 248   Cardiac Enzymes: No results for input(s): CKTOTAL, CKMB, CKMBINDEX, TROPONINI in the last 168 hours. BNP (last 3 results)  Recent Labs  03/18/15 2109 08/03/15 1820  BNP 234.0* 55.1    ProBNP (last 3 results) No results for input(s): PROBNP in the last 8760 hours.  CBG: No results for input(s):  GLUCAP in the last 168 hours.  Recent Results (from the past 240 hour(s))  Urine culture     Status: None   Collection Time: 08/14/15 11:40 AM  Result Value Ref Range Status   Specimen Description URINE, CLEAN CATCH  Final   Special Requests NONE  Final   Culture MULTIPLE SPECIES PRESENT, SUGGEST RECOLLECTION  Final   Report Status 08/15/2015 FINAL  Final     Studies: No results found.  Scheduled Meds: . budesonide-formoterol  2 puff Inhalation BID  . DULoxetine  60 mg Oral Daily  . feeding supplement  1 Container Oral TID BM  . folic acid  1 mg Oral Daily  . heparin  5,000 Units Subcutaneous 3 times per day  . multivitamin with minerals  1 tablet Oral Daily  . nicotine  21 mg Transdermal Daily  . pantoprazole  40 mg Oral Q0600  . QUEtiapine  25 mg Oral BID  . sodium chloride  3 mL Intravenous Q12H  . tiotropium  18 mcg Inhalation Daily   Continuous Infusions:    Principal Problem:   Alcohol dependence Active Problems:   Alcohol withdrawal   Protein-calorie malnutrition, severe   COPD exacerbation   Hyponatremia   Hyperglycemia   Hypokalemia   Acute respiratory failure with hypoxia   Severe protein-calorie malnutrition   Hepatitis C antibody test positive   Elevated LFTs   Hyperkalemia   Hypomagnesemia   Protein-calorie malnutrition    Time  spent: 35 minutes    [facility] M.D. [facility] Pager [phone].. If 7PM-7AM, please contact night-coverage at [email or web address], password [facility] 08/18/2015, 11:45 AM  LOS: 15 days

## 2015-08-19 LAB — BASIC METABOLIC PANEL
ANION GAP: 9 (ref 5–15)
BUN: 9 mg/dL (ref 6–20)
CALCIUM: 8.6 mg/dL — AB (ref 8.9–10.3)
CO2: 23 mmol/L (ref 22–32)
CREATININE: 0.68 mg/dL (ref 0.44–1.00)
Chloride: 105 mmol/L (ref 101–111)
GFR calc non Af Amer: >60 mL/min (ref >60)
Glucose, Bld: 87 mg/dL (ref 65–99)
Potassium: 4.7 mmol/L (ref 3.5–5.1)
SODIUM: 137 mmol/L (ref 135–145)

## 2015-08-19 NOTE — Progress Notes (Signed)
[facility] PROGRESS NOTE  [name]:[id number] DOB: 1961-12-06 DOA: 08/03/2015 PCP: [name], MD  Brief history 54 year old female with a history of alcohol abuse (1 pint vodka daily), tobacco abuse, COPD (non-oxygen dependent), hepatitis C presented with shortness of breath for several days prior to admission. The patient was encephalopathic/obtunded and unable to provide history in the emergency department. She was found to be hypoxic and placed on nonrebreather. She continued to have desaturation into the 70s despite albuterol nebulizers. Alcohol level was 436 at the time of admission. It was thought that her respiratory failure was likely due to respiratory depression from alcohol intoxication. On the morning of 08/07/2015, the patient became increasingly agitated requiring additional doses of Ativan. On 08/08/2015, critical care medicine was consulted due to continued agitation and hallucinations. Fortunately, the patient improved without the use of Precedex. She was transferred back to the medical floor on 08/10/2015. Since then, the patient has been lucid, alert and oriented, but has bouts of impulsiveness.  Assessment/Plan:  Acute respiratory failure with hypoxia due to hypoventilation and respiratory depression from alcoholic intoxication: - Symbicort 80-4 0.5 BID -Spiriva daily - Titrate O2 to maintain SPO2 88 -93%  -presently stable on RA 97-99%  Alcohol dependence/ Alcohol withdrawal/ Delirium -On admission alcohol 436 -UDS--had benzodiazepines and opiates -8/18 RASS score+ 2 to +4 --transferred to ICU -Continue on CIWA protocol -Fortunately did not require Precedex, transferred back to medical floor 08/10/2015 -08/12/2015--patient is alert and aware of her surroundings, but has bursts of agitation and impulsivity -Obtained capacity evaluation and per psychiatry patient without capacity. -?? Wernicke's encephalopathy -Started thiamine 500 mg IV every 8  hours 6 doses--pt refusing it IV--will try to give po -UA and culture nitrite negative small leukocytes. Urine culture with multiple bacterial morphotypes. -check ammonia--61--will not treat as pt is awake and alert and conversant -08/13/15--Dr Tat spoke with the patient's significant other. He stated that the patient is at her baseline where she has episodes of angry outbursts. He feels that she has underlying psychiatric disorder  -08/13/2015--psychiatry consulted--feels that patient initially needed inpatient psych admission--pt will s/p IVC as she presently refused. Psychiatry reassessed the patient and at this point in time feels that patient does not meet criteria for psychiatric inpatient admission and recommending patient to be referred to [facility] for chemical dependency. Patient currently on Seroquel and Cymbalta per psychiatry. -case discussed with Dr. Elsie Saas per Dr Tat. -PT IS MEDICALLY STABLE TO BE TRANSFERRED TO [facility] WHEN BED AVAILABLE.   Tobacco abuse -Continue Nicoderm patch -still smoking--No desire to quit  Hyperglycemia - 8/14 Hemoglobin A1c= 5.6   Hypomagnesemia/Hypokalemia -repleted  Protein-calorie malnutrition, severe - Patient has been seen by nutrition. Continue current supplementation with boost breeze.   Hyponatremia - Improved with hydration.  Elevated LFTs: -Likely due to alcohol abuse -LFTs trending down.   Macrocytic anemia: -Anemia panel unrevealing  -Likely due to continued alcohol use -B12 and folate are WNL  Infectious/Chronic Hep C without Coma -HIV negative -RPR negative -Acute hepatitis panel positive hepatitis C virus antibody -HCV Quant viral load = 285,000 -Outpatient follow-up.  COPD Continue Spiriva and Symbicort.    Code Status: Full Family Communication: Updated patient. No family at bedside. Disposition Plan: Awaiting bed for referral to [facility] for chemical dependency per recommendations of psychiatry  recommendations.   Consultants:  Psychiatry: Dr. Elsie Saas 08/13/2015  PCCCM : Dr. Vassie Loll 08/08/2015  Procedures:  Acute abdominal series 08/03/2015    Antibiotics:  None  HPI/Subjective: Patient sitting at bedside. Events this  morning noted.  Objective: Filed Vitals:   08/19/15 0602  BP: 129/101  Pulse: 92  Temp: 98.3 F (36.8 C)  Resp: 20    Intake/Output Summary (Last 24 hours) at 08/19/15 1242 Last data filed at 08/19/15 0939  Gross per 24 hour  Intake    586 ml  Output      0 ml  Net    586 ml   Filed Weights   08/04/15 0105  Weight: 61.3 kg (135 lb 2.3 oz)    Exam:   General:  NAD  Cardiovascular: RRR  Respiratory: CTAB  Abdomen: Soft, nontender, nondistended, positive bowel sounds.  Musculoskeletal: No clubbing cyanosis or edema.  Data Reviewed: Basic Metabolic Panel:  Recent Labs Lab 08/13/15 0437 08/14/15 0940 08/16/15 0350 08/19/15 0309  NA 138 138 139 137  K 3.2* 3.8 3.6 4.7  CL 103 104 104 105  CO2 GLUCOSE 116* 113* 124* 87  BUN 5* CREATININE 0.52 0.80 0.57 0.68  CALCIUM 8.5* 8.6* 8.6* 8.6*  MG 1.7  --  1.8  --    Liver Function Tests:  Recent Labs Lab 08/16/15 0350  AST 53*  ALT 39  ALKPHOS 107  BILITOT 0.2*  PROT 6.4*  ALBUMIN 2.7*   No results for input(s): LIPASE, AMYLASE in the last 168 hours.  Recent Labs Lab 08/13/15 0547  AMMONIA 61*   CBC: No results for input(s): WBC, NEUTROABS, HGB, HCT, MCV, PLT in the last 168 hours. Cardiac Enzymes: No results for input(s): CKTOTAL, CKMB, CKMBINDEX, TROPONINI in the last 168 hours. BNP (last 3 results)  Recent Labs  03/18/15 2109 08/03/15 1820  BNP 234.0* 55.1    ProBNP (last 3 results) No results for input(s): PROBNP in the last 8760 hours.  CBG: No results for input(s): GLUCAP in the last 168 hours.  Recent Results (from the past 240 hour(s))  Urine culture     Status: None   Collection Time: 08/14/15 11:40 AM  Result  Value Ref Range Status   Specimen Description URINE, CLEAN CATCH  Final   Special Requests NONE  Final   Culture MULTIPLE SPECIES PRESENT, SUGGEST RECOLLECTION  Final   Report Status 08/15/2015 FINAL  Final     Studies: No results found.  Scheduled Meds: . budesonide-formoterol  2 puff Inhalation BID  . DULoxetine  60 mg Oral Daily  . feeding supplement  1 Container Oral TID BM  . folic acid  1 mg Oral Daily  . heparin  5,000 Units Subcutaneous 3 times per day  . multivitamin with minerals  1 tablet Oral Daily  . nicotine  21 mg Transdermal Daily  . pantoprazole  40 mg Oral Q0600  . QUEtiapine  25 mg Oral BID  . sodium chloride  3 mL Intravenous Q12H  . tiotropium  18 mcg Inhalation Daily   Continuous Infusions:    Principal Problem:   Alcohol dependence Active Problems:   Alcohol withdrawal   Protein-calorie malnutrition, severe   COPD exacerbation   Hyponatremia   Hyperglycemia   Hypokalemia   Acute respiratory failure with hypoxia   Severe protein-calorie malnutrition   Hepatitis C antibody test positive   Elevated LFTs   Hyperkalemia   Hypomagnesemia   Protein-calorie malnutrition    Time spent: 35 minutes    THOMPSON,DANIEL M.D. [facility] Pager [phone].. If 7PM-7AM, please contact night-coverage at [email or web address], password [facility] 08/19/2015, 12:42 PM  LOS: 16 days

## 2015-08-19 NOTE — Progress Notes (Signed)
Physical Therapy Treatment Patient Details Name: Rhonda Mitchell MRN: [id number] DOB: 10-18-1961 Today's Date: 08/19/2015    History of Present Illness 54 year old female with a history of alcohol abuse (1 pint vodka daily), tobacco abuse, COPD (non-oxygen dependent), hepatitis C presented with shortness of breath for several days prior to admission. The patient was encephalopathic/obtunded and unable to provide history in the emergency department. She was found to be hypoxic and placed on nonrebreather. She continued to have desaturation into the 70s despite albuterol nebulizers. Alcohol level was 436 at the time of admission. It was thought that her respiratory failure was likely due to respiratory depression from alcohol intoxication.  Pt also with PMH of chronic hepatitis C.     PT Comments    Pt con't to have both impaired cognition/confusion and balance requiring constant supervision and minA to prevent falls. Acute PT to con't to follow to improve balance as able.  Follow Up Recommendations  SNF;Supervision/Assistance - 24 hour     Equipment Recommendations  None recommended by PT    Recommendations for Other Services       Precautions / Restrictions Precautions Precautions: Fall Restrictions Weight Bearing Restrictions: No    Mobility  Bed Mobility               General bed mobility comments: pt up in room  Transfers Overall transfer level: Needs assistance Equipment used: None Transfers: Sit to/from Stand Sit to Stand: Min guard         General transfer comment: pt impulsive, min guard for safety  Ambulation/Gait Ambulation/Gait assistance: Min assist Ambulation Distance (Feet): 610 Feet Assistive device: None Gait Pattern/deviations: Step-through pattern;Staggering left;Staggering right;Wide base of support     General Gait Details: pt unable to walk in straight line, pt with occasionaly cross over gait pattern to catch balance requiring minA to  maintian upright posture. pt refused to use RW however suspect pt to by more apt to fall due to decreased safety awareness and unfamilarity of AD   Stairs            Wheelchair Mobility    Modified Rankin (Stroke Patients Only)       Balance Overall balance assessment: History of Falls             Standing balance comment: pt bent down numerous times to pick things up off floor and look through drawers, pt unsteady requiring minA to prevent LOB to floor.                    Cognition Arousal/Alertness: Awake/alert Behavior During Therapy: Impulsive Overall Cognitive Status: No family/caregiver present to determine baseline cognitive functioning Area of Impairment: Orientation;Attention;Safety/judgement;Awareness;Problem solving Orientation Level: Disoriented to;Time;Situation;Place Current Attention Level: Focused Memory: Decreased short-term memory Following Commands: Follows one step commands inconsistently Safety/Judgement: Decreased awareness of safety Awareness: Intellectual Problem Solving: Slow processing;Requires verbal cues;Requires tactile cues General Comments: pt reports shes in [place], pt unaware of her imparired balance. constantly requesting to go to liquor store    Exercises      General Comments        Pertinent Vitals/Pain Pain Assessment: No/denies pain    Home Living                      Prior Function            PT Goals (current goals can now be found in the care plan section) Acute Rehab PT Goals Patient Stated Goal: go  to liquor store Progress towards PT goals: Progressing toward goals    Frequency  Min 3X/week    PT Plan Current plan remains appropriate    Co-evaluation             End of Session Equipment Utilized During Treatment: Gait belt Activity Tolerance: Patient tolerated treatment well Patient left:  (walking in room with sitter)     Time: 0960-4540 PT Time Calculation (min)  (ACUTE ONLY): 11 min  Charges:  $Gait Training: 8-22 mins                    G Codes:      Marcene Brawn 08/19/2015, 1:50 PM   Lewis Shock, PT, DPT Pager #: [phone] Office #: [phone]

## 2015-08-19 NOTE — Clinical Social Work Psych Note (Signed)
Psych CSW received notification from [facility] that the patient has been denied admission.  Psychiatrist updated.    Vickii Penna, LCSW [phone]  Psychiatric & Orthopedics (5N 1-8) Clinical Social Worker

## 2015-08-20 NOTE — Clinical Social Work Psych Note (Addendum)
Case has been reviewed with psychiatry and Medical Director.  Patient mentation has significantly worsened with active and frequent delusions.  [facility] has currently denied patient.  Referral will be submitted to [facility]) for review for possible admission to the waitlist.  Medical Director and psychiatrist aware and agreeable.  [facility] Authorization received [id number]  Vickii Penna, LCSW [phone]  Psychiatric & Orthopedics (5N 1-8) Clinical Social Worker
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
## 2015-08-21 DIAGNOSIS — F1029 Alcohol dependence with unspecified alcohol-induced disorder: Secondary | ICD-10-CM

## 2015-08-21 DIAGNOSIS — F29 Unspecified psychosis not due to a substance or known physiological condition: Secondary | ICD-10-CM | POA: Clinically undetermined

## 2015-08-21 MED ORDER — ENSURE ENLIVE PO LIQD
237.0000 mL | Freq: Two times a day (BID) | ORAL | Status: DC
  Administered 2015-08-22 – 2015-08-28 (×9): 237 mL via ORAL

## 2015-08-21 MED ORDER — QUETIAPINE FUMARATE 50 MG PO TABS
50.0000 mg | ORAL_TABLET | Freq: Two times a day (BID) | ORAL | Status: DC
  Administered 2015-08-21 – 2015-08-23 (×4): 50 mg via ORAL
  Filled 2015-08-21 (×4): qty 1

## 2015-08-21 NOTE — Progress Notes (Signed)
PT Cancellation Note  Patient Details Name: Rhonda Mitchell MRN: [id number] DOB: 1961-03-20   Cancelled Treatment:    Reason Eval/Treat Not Completed: Other (comment) (agitated in 4 pt restraints) Spoke with RN. Patient has become more agitated today and is not following safety instructions. Currently in four point restraints. Will hold PT treatment at this time.  Berton Mount 08/21/2015, 5:21 PM  Sunday [place], [place] [phone]

## 2015-08-21 NOTE — Progress Notes (Signed)
Initial Nutrition Assessment  DOCUMENTATION CODES:   Not applicable  INTERVENTION:   -D/c Boost Breeze po TID, each supplement provides 250 kcal and 9 grams of protein -Ensure Enlive po BID, each supplement provides 350 kcal and 20 grams of protein  NUTRITION DIAGNOSIS:   Increased nutrient needs related to chronic illness as evidenced by estimated needs.  Ongoing  GOAL:   Patient will meet greater than or equal to 90% of their needs  Progressing  MONITOR:   PO intake, Supplement acceptance, Labs, Weight trends, Skin, I & O's  REASON FOR ASSESSMENT:   Consult Assessment of nutrition requirement/status  ASSESSMENT:   Rhonda Mitchell is a 54 y.o. female past medical history of alcohol abuse ongoing tobacco abuse and COPD non-oxygen dependent at comes in for shortness of breath that started several days prior to admission. The patient is sleepy and cannot provide a history so most of the history is obtained from the chart. As per chart the patient had been short of breath last several days. She denies any cough or fever.  Staff reports that pt's mentation has worsened. Pt experiencing active and frequent delusions. Pt was in the hallway at time of visit, attempting to wash her face with hand sanitizer dispenser on the wall; nursing staff attempting to redirect pt.   Pt intake has been variable, likely due to mental status. Meal intake 10-100%, per doc flowsheets. Averaging 10-40% meal completion within the past 48 hours. Pt accepts Boost Breeze supplements 50% of times it is offered. RD will switch supplement to Ensure Enlive given declining PO intake and Ensure's increase nutritient density compared to [employer].  CSW and psychiatry continue to follow. [facility] has denied pt. Plan is to transfer to inpatient psychiatric facility when bed is available. Referral has been submitted to [facility] for possible admission to waitlist.   Labs reviewed.   Diet Order:   Diet regular Room service appropriate?: Yes; Fluid consistency:: Thin  Skin:  Reviewed, no issues  Last BM:  08/20/15  Height:   Ht Readings from Last 1 Encounters:  08/04/15 5' (1.524 m)    Weight:   Wt Readings from Last 1 Encounters:  08/04/15 135 lb 2.3 oz (61.3 kg)    Ideal Body Weight:  45.5 kg  BMI:  Body mass index is 26.39 kg/(m^2).  Estimated Nutritional Needs:   Kcal:  1500-1700  Protein:  70-80 grams  Fluid:  1.5-1.7 L  EDUCATION NEEDS:   No education needs identified at this time  Keylon Labelle A. Mayford Knife, RD, LDN, CDE Pager: [phone] After hours Pager: [phone]
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
## 2015-08-21 NOTE — Consult Note (Signed)
[facility] Face-to-Face Psychiatry Consult follow up  Reason for Consult:  Psychosis and agitation/aggression Referring Physician:  Dr. Janee Morn Patient Identification: Rhonda Mitchell MRN:  [id number] Principal Diagnosis: Psychosis Diagnosis:   Patient Active Problem List   Diagnosis Date Noted  . Psychosis [F29] 08/21/2015  . Hypomagnesemia [E83.42]   . Protein-calorie malnutrition [E46]   . Hepatitis C antibody test positive [R89.4]   . Elevated LFTs [R79.89]   . Hyperkalemia [E87.5]   . Severe protein-calorie malnutrition [E43]   . Acute respiratory failure with hypoxia [J96.01] 08/03/2015  . Septic olecranon bursitis of right elbow [M70.21, B96.89] 05/08/2015  . Septic bursitis of elbow [M71.129] 05/07/2015  . Thrombocytopenia [D69.6] 05/07/2015  . Chronic hepatitis C without hepatic coma [B18.2] 05/07/2015  . Hypokalemia [E87.6] 05/07/2015  . Abscess of bursa of elbow [M71.029] 05/07/2015  . COPD exacerbation [J44.1] 03/18/2015  . Hyponatremia [E87.1] 03/18/2015  . Hyperglycemia [R73.9] 03/18/2015  . Alcohol dependence [F10.20] 12/07/2014  . Alcohol withdrawal [F10.239] 12/07/2014  . Protein-calorie malnutrition, severe [E43] 12/07/2014  . Musculoskeletal chest pain [R07.89] 12/07/2014  . Tobacco abuse [Z72.0] 12/07/2014  . Carbuncle and furuncle [L02.92, L02.93] 12/07/2014  . Macrocytic anemia [D53.9] 12/07/2014  . Chest pain [R07.9] 11/30/2014  . Tachycardia [R00.0] 11/30/2014  . COPD (chronic obstructive pulmonary disease) [J44.9] 11/30/2014  . Cellulitis and abscess [L03.90, L02.91] 11/30/2014  . Atypical chest pain [R07.89]   . Dehydration [E86.0]     Total Time spent with patient: 30 minutes  Subjective:   Rhonda Mitchell is a 54 y.o. female admitted with alcohol intoxication.  HPI:  Rhonda Mitchell is a 54 years old female seen face-to-face for psychiatric consultation and evaluation of alcohol intoxication and withdrawal symptoms and capacity evaluation.  Patient is a poor historian and not responded most of the questions asked today. Patient is awake and easily responding to verbal stimuli but decided not to talk today. Patient appeared lying in her bed, calm and without distress. Information for this evaluation up 10 from the available medical records and case discussed with the staff in the room and also staff RN. Patient has been poorly responsive to the verbal stimuli, difficult to redirect, easily getting irritable, confused and combative. Patient was known to have false twice yesterday. Reportedly patient has been suffering with severe alcohol intoxication, dependence and abuse. Patient blood alcohol level on arrival was 436. Patient has previous hospital visits with the alcohol intoxication. Reportedly patient boyfriend has been worried about patient inability to manage herself without rehabilitation treatment after medically stabilized. Based on my current evaluation patient does not meet criteria for capacity to make her own medical decisions or living arrangements. Patient benefit from Ativan alcohol detox treatment and also inpatient rehabilitation treatment: Medically stable. On the morning of 08/07/2015, the patient became increasingly agitated requiring additional doses of Ativan. On 08/08/2015, critical care medicine was consulted due to continued agitation and hallucinations. Fortunately, the patient improved without the use of Precedex. She was transferred back to the medical floor on 08/10/2015. Patient continued to be poorly cooperative with the staff and current treatment recommendations.  08/21/2015 Interval History: Patient seen for psychiatric consultation follow-up, case discussed with Vickii Penna, LCSW and spoke with Patient significant other, patient sitter and staff RN . Patient is currently has significant psychosis, delusions and confusion. Patient reportedly become easily agitated, aggressive towards staff who are trying to redirect  her from inappropriate behaviors and walking into other patient room thinking that she is going to church in her community. Patient significant  other stated that she has long history of mental illness and self medicated with drug of abuse over the years. She is calling on his phone several times a day but leaving messages that does not make sense to him. Patient has significant deterioration in her mental status since Friday as per staff taking care off her. She is inappropriately grabbing staff and to hug them.   Patient has at least two episodes of bing physical and throwing coffee on staff inappropriately for the last one week. Patient stated that she thought about staff was chasing her and harassing her. She has poor insight, judgment and impulse control. Patient significant other concern about her safety, know that she won't be able to care for herself. He won't be able to care for her due to his primary job responsibilities. Patient appeared lying in her bed and being sedated at this time. Staff reported patient has been acting out this morning, irritable, agitation or aggressive and restless. She fell by tripping and try to walk without walking aids. She has been from alcohol withdrawal symptoms.   Patient has long history of polysubstance abuse and recent alcohol dependence. Patient has two DUI's and pending court date in October 4, 16 as per significant other who requested a letter about her current status to a Clinical research associate.   Past Medical History:  Past Medical History  Diagnosis Date  . COPD (chronic obstructive pulmonary disease)   . Anxiety   . Shortness of breath   . Arthritis   . GERD (gastroesophageal reflux disease)   . Full dentures   . Insomnia   . Alcohol abuse   . Hepatitis C   . Alcohol abuse   . History of MRSA infection   . History of encephalopathy     Past Surgical History  Procedure Laterality Date  . Arm surgery    . Hand surgery  1990    both rt/lt carpal tunnels  .  Shoulder surgery      right and left-age [age]  . Orif ulnar / radial shaft fracture  1990    right-got infection-had total 10 surgeies 1990  . Orif ulnar fracture Left 05/23/2014    Procedure: OPEN REDUCTION INTERNAL FIXATION (ORIF) LEFT ULNAR FRACTURE;  Surgeon: Harvie Junior, MD;  Location: [facility];  Service: Orthopedics;  Laterality: Left;  . I&d extremity Right 05/07/2015    Procedure: IRRIGATION AND DEBRIDEMENT  ELBOW ;  Surgeon: Dominica Severin, MD;  Location: [facility] OR;  Service: Orthopedics;  Laterality: Right;   Family History:  Family History  Problem Relation Age of Onset  . Breast cancer Mother    Social History:  History  Alcohol Use  . Yes    Comment: 1/5th Vodka every day      History  Drug Use No    Social History   Social History  . Marital Status: Legally Separated    Spouse Name: N/A  . Number of Children: N/A  . Years of Education: N/A   Social History Main Topics  . Smoking status: Current Every Day Smoker -- 1.00 packs/day for 20 years    Types: Cigarettes  . Smokeless tobacco: Never Used  . Alcohol Use: Yes     Comment: 1/5th Vodka every day   . Drug Use: No  . Sexual Activity: Yes    Birth Control/ Protection: Post-menopausal   Other Topics Concern  . None   Social History Narrative   Additional Social History:  Allergies:  No Known Allergies  Labs:  No results found for this or any previous visit (from the past 48 hour(s)).  Vitals: Blood pressure 121/97, pulse 71, temperature 97.8 F (36.6 C), temperature source Oral, resp. rate 21, height 5' (1.524 m), weight 61.3 kg (135 lb 2.3 oz), SpO2 96 %.  Risk to Self: Is patient at risk for suicide?: No Risk to Others:   Prior Inpatient Therapy:   Prior Outpatient Therapy:    Current Facility-Administered Medications  Medication Dose Route Frequency Provider Last Rate Last Dose  . acetaminophen (TYLENOL) tablet 650 mg  650 mg Oral Q6H PRN  Catarina Hartshorn, MD   650 mg at 08/20/15 2151  . budesonide-formoterol (SYMBICORT) 80-4.5 MCG/ACT inhaler 2 puff  2 puff Inhalation BID Marinda Elk, MD   2 puff at 08/21/15 [phone]  . DULoxetine (CYMBALTA) DR capsule 60 mg  60 mg Oral Daily Marinda Elk, MD   60 mg at 08/21/15 0800  . feeding supplement (ENSURE ENLIVE) (ENSURE ENLIVE) liquid 237 mL  237 mL Oral BID BM Jenifer A Williams, RD      . folic acid (FOLVITE) tablet 1 mg  1 mg Oral Daily Bertram Millard, RPH   1 mg at 08/21/15 0801  . gi cocktail (Maalox,Lidocaine,Donnatal)  30 mL Oral TID PRN Rodolph Bong, MD   30 mL at 08/16/15 2213  . heparin injection 5,000 Units  5,000 Units Subcutaneous 3 times per day Marinda Elk, MD   5,000 Units at 08/21/15 [phone]  . LORazepam (ATIVAN) injection 2-4 mg  2-4 mg Intravenous Q2H PRN Catarina Hartshorn, MD   4 mg at 08/21/15 0423   Or  . LORazepam (ATIVAN) tablet 2-4 mg  2-4 mg Oral Q2H PRN Catarina Hartshorn, MD   4 mg at 08/21/15 0800  . multivitamin with minerals tablet 1 tablet  1 tablet Oral Daily Rahul P Desai, PA-C   1 tablet at 08/21/15 0800  . nicotine (NICODERM CQ - dosed in mg/24 hours) patch 21 mg  21 mg Transdermal Daily Drema Dallas, MD   21 mg at 08/21/15 0802  . ondansetron (ZOFRAN-ODT) disintegrating tablet 4 mg  4 mg Oral Q8H PRN Jinger Neighbors, NP   4 mg at 08/16/15 2213  . pantoprazole (PROTONIX) EC tablet 40 mg  40 mg Oral Q0600 Rodolph Bong, MD   40 mg at 08/21/15 2952  . QUEtiapine (SEROQUEL) tablet 50 mg  50 mg Oral BID Leata Mouse, MD      . sodium chloride 0.9 % injection 3 mL  3 mL Intravenous Q12H Marinda Elk, MD   3 mL at 08/20/15 2153  . tiotropium (SPIRIVA) inhalation capsule 18 mcg  18 mcg Inhalation Daily Marinda Elk, MD   18 mcg at 08/21/15 [phone]  . traMADol (ULTRAM) tablet 50 mg  50 mg Oral Q6H PRN Rodolph Bong, MD   50 mg at 08/21/15 0555    Musculoskeletal: Strength & Muscle Tone: decreased Gait & Station: unable to  stand Patient leans: Front  Psychiatric Specialty Exam: Physical Exam   ROS   Blood pressure 121/97, pulse 71, temperature 97.8 F (36.6 C), temperature source Oral, resp. rate 21, height 5' (1.524 m), weight 61.3 kg (135 lb 2.3 oz), SpO2 96 %.Body mass index is 26.39 kg/(m^2).  General Appearance: Guarded  Eye Contact::  Good  Speech:  Blocked, Garbled and Slow  Volume:  Normal  Mood:  Depressed and Irritable  Affect:  Non-Congruent, Inappropriate and Labile  Thought Process:  Irrelevant and Loose  Orientation:  Negative  Thought Content:  Rumination  Suicidal Thoughts:  No  Homicidal Thoughts:  No  Memory:  Immediate;   Poor  Judgement:  Poor  Insight:  Shallow  Psychomotor Activity:  Decreased  Concentration:  Fair  Recall:  [employer] of Knowledge:Fair  Language: Fair  Akathisia:  Negative  Handed:  Right  AIMS (if indicated):     Assets:  Communication Skills  ADL's:  Impaired  Cognition: Impaired,  Moderate  Sleep:      Medical Decision Making: New problem, with additional work up planned, Review of Psycho-Social Stressors (1), Review or order clinical lab tests (1), Established Problem, Worsening (2), Review of Last Therapy Session (1), Review or order medicine tests (1), Review of Medication Regimen & Side Effects (2) and Review of New Medication or Change in Dosage (2)  Treatment Plan Summary: Patient with psychosis and behavioral problems. Patient cannot contract for safety as she was not able to establish safety during this hospitalization. She has poor insight, judgment and impulse control. Patient significant other concern about her safety, and deterioration of her mental health state since completing her detox treatment.     Daily contact with patient to assess and evaluate symptoms and progress in treatment and Medication management  Plan:  Patient has no capacity to make her own medical decision and living arrangement as of my evaluation today.  Patient can  not care for her self due to increased psychosis, delusion and agitation Continue IVC due to agitation and aggression towards staff members Case discussed with Psychiatric social service - [facility]  Safety concerns: Continue safety sitter Increase seroquel 50 mg PO BID for agitation and delusions Recommend psychiatric Inpatient admission when medically cleared. Supportive therapy provided about ongoing stressors.  Appreciate psychiatric consultation and follow up as clinically required Please contact [phone] or [phone] if needs further assistance  Disposition: Case discussed with the psychiatric social service and Dr. Janee Morn regarding safe disposition plan. Referred to [facility] for further psychiatric care at this time.    Elverta Dimiceli,JANARDHAHA R. 08/21/2015 12:37 PM

## 2015-08-22 NOTE — Progress Notes (Signed)
[facility] PROGRESS NOTE  Rhonda Mitchell RUE:[id number] DOB: 1961/05/26 DOA: 08/03/2015 PCP: [name], MD  Brief history 54 year old female with a history of alcohol abuse (1 pint vodka daily), tobacco abuse, COPD (non-oxygen dependent), hepatitis C presented with shortness of breath for several days prior to admission. The patient was encephalopathic/obtunded and unable to provide history in the emergency department. She was found to be hypoxic and placed on nonrebreather. She continued to have desaturation into the 70s despite albuterol nebulizers. Alcohol level was 436 at the time of admission. It was thought that her respiratory failure was likely due to respiratory depression from alcohol intoxication. On the morning of 08/07/2015, the patient became increasingly agitated requiring additional doses of Ativan. On 08/08/2015, critical care medicine was consulted due to continued agitation and hallucinations. Fortunately, the patient improved without the use of Precedex. She was transferred back to the medical floor on 08/10/2015. Since then, the patient has been lucid, alert and oriented, but has bouts of impulsiveness. Patient also with bouts of agitation and aggression towards staff members. Patient has been IVC. Patient has been denied by [facility] for chemical dependency rehabilitation. Patient does have a Recruitment consultant. Patient's Seroquel dose has been increased to 50 mg twice daily for agitation and delusions per psychiatry who recommended inpatient psychiatric admission. Patient has no capacity and unsafe for patient to be home by herself. Patient's boyfriend due to his primary job responsibilities will be able to care for her on a daily basis. Awaiting inpatient psychiatric bed.  Assessment/Plan:  Acute respiratory failure with hypoxia due to hypoventilation and respiratory depression from alcoholic intoxication: - Symbicort 80-4 0.5 BID - Spiriva daily - Titrate O2 to maintain  SPO2 88 -93%  - presently stable on RA 97-99%  Alcohol dependence/ Alcohol withdrawal/ Delirium -Pt remains aggressive and altered -Psychiatry: Dr. Elsie Saas on board -PT IS MEDICALLY STABLE TO BE TRANSFERRED TO PSYCHIATRIC INPATIENT FACILITY WHEN BED AVAILABLE.  Tobacco abuse - Continue Nicoderm patch - still smoking--No desire to quit  Hyperglycemia - 8/14 Hemoglobin A1c= 5.6   Hypomagnesemia/Hypokalemia -repleted  Protein-calorie malnutrition, severe - Patient has been seen by nutrition. Continue current supplementation with boost breeze.   Hyponatremia - Improved with hydration.  Elevated LFTs: -Likely due to alcohol abuse -LFTs trending down.   Macrocytic anemia: -Anemia panel unrevealing  -Likely due to continued alcohol use -B12 and folate are WNL  Infectious/Chronic Hep C without Coma -HIV negative -RPR negative -Acute hepatitis panel positive hepatitis C virus antibody -HCV Quant viral load = 285,000 -Outpatient follow-up.  COPD Continue Spiriva and Symbicort.    Code Status: Full Family Communication: Updated patient. No family at bedside. Disposition Plan: Needs psychiatric inpatient admission when bed available. Patient has been denied [facility] for chemical dependency rehabilitation. Patient with no capacity and patient cannot care for herself.   Consultants:  Psychiatry: Dr. Elsie Saas 08/13/2015  [facility] : Dr. Vassie Loll 08/08/2015  Procedures:  Acute abdominal series 08/03/2015    Antibiotics:  None  HPI/Subjective: Patient again required non violent restraints due to aggressiveness towards staff.  Objective: Filed Vitals:   08/22/15 1455  BP: 99/85  Pulse: 57  Temp: 97.9 F (36.6 C)  Resp: 18    Intake/Output Summary (Last 24 hours) at 08/22/15 1732 Last data filed at 08/22/15 0826  Gross per 24 hour  Intake    220 ml  Output    450 ml  Net   -230 ml   Filed Weights   08/04/15 0105  Weight: 61.3 kg (135  lb 2.3 oz)     Exam:   General:  NAD, resting comfortably  Cardiovascular: Pt refused to be examined with stethoscope, no cyanosis  Respiratory: pt refused stethoscope examination. Equal chest rise, no wheezes  Abdomen: ND, no guarding  Musculoskeletal: No clubbing cyanosis or edema.  Data Reviewed: Basic Metabolic Panel:  Recent Labs Lab 08/16/15 0350 08/19/15 0309  NA 139 137  K 3.6 4.7  CL 104 105  CO2 26 23  GLUCOSE 124* 87  BUN 6 9  CREATININE 0.57 0.68  CALCIUM 8.6* 8.6*  MG 1.8  --    Liver Function Tests:  Recent Labs Lab 08/16/15 0350  AST 53*  ALT 39  ALKPHOS 107  BILITOT 0.2*  PROT 6.4*  ALBUMIN 2.7*   No results for input(s): LIPASE, AMYLASE in the last 168 hours. No results for input(s): AMMONIA in the last 168 hours. CBC: No results for input(s): WBC, NEUTROABS, HGB, HCT, MCV, PLT in the last 168 hours. Cardiac Enzymes: No results for input(s): CKTOTAL, CKMB, CKMBINDEX, TROPONINI in the last 168 hours. BNP (last 3 results)  Recent Labs  03/18/15 2109 08/03/15 1820  BNP 234.0* 55.1    ProBNP (last 3 results) No results for input(s): PROBNP in the last 8760 hours.  CBG: No results for input(s): GLUCAP in the last 168 hours.  Recent Results (from the past 240 hour(s))  Urine culture     Status: None   Collection Time: 08/14/15 11:40 AM  Result Value Ref Range Status   Specimen Description URINE, CLEAN CATCH  Final   Special Requests NONE  Final   Culture MULTIPLE SPECIES PRESENT, SUGGEST RECOLLECTION  Final   Report Status 08/15/2015 FINAL  Final     Studies: No results found.  Scheduled Meds: . budesonide-formoterol  2 puff Inhalation BID  . DULoxetine  60 mg Oral Daily  . feeding supplement (ENSURE ENLIVE)  237 mL Oral BID BM  . folic acid  1 mg Oral Daily  . heparin  5,000 Units Subcutaneous 3 times per day  . multivitamin with minerals  1 tablet Oral Daily  . nicotine  21 mg Transdermal Daily  . pantoprazole  40 mg Oral Q0600   . QUEtiapine  50 mg Oral BID  . sodium chloride  3 mL Intravenous Q12H  . tiotropium  18 mcg Inhalation Daily   Continuous Infusions:    Principal Problem:   Psychosis Active Problems:   Alcohol dependence   Alcohol withdrawal   Protein-calorie malnutrition, severe   COPD exacerbation   Hyponatremia   Hyperglycemia   Hypokalemia   Acute respiratory failure with hypoxia   Severe protein-calorie malnutrition   Hepatitis C antibody test positive   Elevated LFTs   Hyperkalemia   Hypomagnesemia   Protein-calorie malnutrition    Time spent: 35 minutes    [name] M.D. [facility] Pager [phone]. If 7PM-7AM, please contact night-coverage at [email or web address], password [facility] 08/22/2015, 5:32 PM  LOS: 19 days

## 2015-08-22 NOTE — Clinical Social Work Psych Note (Signed)
Patient's involuntary commitment (IVC) paperwork has been updated.  Expires 9/7.  Patient continues to be in review with [facility].  Barbara at [facility] has requested updated CBC.  This information was sent via fax.    Patient has now been placed on the [facility] waitlist.  Wait list approximately 14-20 days for female admissions.   Vickii Penna, LCSW [phone]  Psychiatric & Orthopedics (5N 1-8) Clinical Social Worker

## 2015-08-22 NOTE — Progress Notes (Addendum)
Nursing note: Assisting NT with getting the patient back in the bed.  The patient combative at the time.  The patient had a phone in her hand that the NT was trying to remove.  The patient swung with the phone and it hit the nurse in the jaw.  Will continue to monitor for agitation.

## 2015-08-22 NOTE — Care Management Note (Signed)
Case Management Note  Patient Details  Name: Rhonda Mitchell MRN: [id number] Date of Birth: 30-May-1961  Subjective/Objective:                    Action/Plan:   Expected Discharge Date:                  Expected Discharge Plan:  [facility]  In-House Referral:  Clinical Social Work  Discharge planning Services  CM Consult  Post Acute Care Choice:    Choice offered to:     DME Arranged:    DME Agency:     HH Arranged:    HH Agency:     Status of Service:  In process, will continue to follow  Medicare Important Message Given:    Date Medicare IM Given:    Medicare IM give by:    Date Additional Medicare IM Given:    Additional Medicare Important Message give by:     If discussed at Long Length of Stay Meetings, dates discussed:  08-22-15   Additional Comments:  Kingsley Plan, RN 08/22/2015, 2:49 PM

## 2015-08-22 NOTE — Progress Notes (Signed)
PT Cancellation Note  Patient Details Name: Rhonda Mitchell MRN: [id number] DOB: Feb 15, 1961   Cancelled Treatment:    Reason Eval/Treat Not Completed: Fatigue/lethargy limiting ability to participate. RN and sitter report pt very restless last night and early this AM with agitation. Pt given meds and is now sleeping. Rx attempted x 2. PT will follow up as time allows, this afternoon or tomorrow.   Ilda Foil 08/22/2015, 12:20 PM

## 2015-08-23 MED ORDER — WHITE PETROLATUM GEL
Status: AC
  Administered 2015-08-24
  Filled 2015-08-23: qty 1

## 2015-08-23 MED ORDER — QUETIAPINE FUMARATE 50 MG PO TABS
50.0000 mg | ORAL_TABLET | Freq: Three times a day (TID) | ORAL | Status: DC
  Administered 2015-08-23 – 2015-08-28 (×15): 50 mg via ORAL
  Filled 2015-08-23 (×15): qty 1

## 2015-08-23 NOTE — Progress Notes (Signed)
[facility] PROGRESS NOTE  Rhonda Mitchell ZOX:[id number] DOB: Dec 15, 1961 DOA: 08/03/2015 PCP: [name], MD  Brief history 54 year old female with a history of alcohol abuse (1 pint vodka daily), tobacco abuse, COPD (non-oxygen dependent), hepatitis C presented with shortness of breath for several days prior to admission. The patient was encephalopathic/obtunded and unable to provide history in the emergency department. She was found to be hypoxic and placed on nonrebreather. She continued to have desaturation into the 70s despite albuterol nebulizers. Alcohol level was 436 at the time of admission. It was thought that her respiratory failure was likely due to respiratory depression from alcohol intoxication. On the morning of 08/07/2015, the patient became increasingly agitated requiring additional doses of Ativan. On 08/08/2015, critical care medicine was consulted due to continued agitation and hallucinations. Fortunately, the patient improved without the use of Precedex. She was transferred back to the medical floor on 08/10/2015. Since then, the patient has been lucid, alert and oriented, but has bouts of impulsiveness. Patient also with bouts of agitation and aggression towards staff members. Patient has been IVC. Patient has been denied by [facility] for chemical dependency rehabilitation. Patient does have a Recruitment consultant. Patient's Seroquel dose has been increased to 50 mg twice daily for agitation and delusions per psychiatry who recommended inpatient psychiatric admission. Patient has no capacity and unsafe for patient to be home by herself. Patient's boyfriend due to his primary job responsibilities will be able to care for her on a daily basis. Awaiting inpatient psychiatric bed.  Assessment/Plan:  Acute respiratory failure with hypoxia due to hypoventilation and respiratory depression from alcoholic intoxication: - resolved continue current regimen.  Alcohol dependence/ Alcohol  withdrawal/ Delirium -Pt remains aggressive and altered -Psychiatry: Dr. Elsie Saas on board -PT IS MEDICALLY STABLE TO BE TRANSFERRED TO PSYCHIATRIC INPATIENT FACILITY WHEN BED AVAILABLE. Still waiting for bed  Tobacco abuse - Continue Nicoderm patch - still smoking--No desire to quit  Hyperglycemia - 8/14 Hemoglobin A1c= 5.6   Hypomagnesemia/Hypokalemia -repleted  Protein-calorie malnutrition, severe - Patient has been seen by nutrition. Continue current supplementation with boost breeze.   Hyponatremia - Improved with hydration.  Elevated LFTs: -Likely due to alcohol abuse -LFTs trending down.   Macrocytic anemia: -Anemia panel unrevealing  -Likely due to continued alcohol use -B12 and folate are WNL  Infectious/Chronic Hep C without Coma -HIV negative -RPR negative -Acute hepatitis panel positive hepatitis C virus antibody -HCV Quant viral load = 285,000 -Outpatient follow-up.  COPD Continue Spiriva and Symbicort.    Code Status: Full Family Communication: Updated patient. No family at bedside. Disposition Plan: Needs psychiatric inpatient admission when bed available. Patient has been denied [facility] for chemical dependency rehabilitation. Patient with no capacity and patient cannot care for herself.   Consultants:  Psychiatry: Dr. Elsie Saas 08/13/2015  P[facility] : Dr. Vassie Loll 08/08/2015  Procedures:  Acute abdominal series 08/03/2015    Antibiotics:  None  HPI/Subjective: Patient has no new complaints. No acute issues reported overnight.  Objective: Filed Vitals:   08/23/15 1407  BP: 109/72  Pulse: 73  Temp: 97.7 F (36.5 C)  Resp: 18    Intake/Output Summary (Last 24 hours) at 08/23/15 1420 Last data filed at 08/23/15 1409  Gross per 24 hour  Intake    480 ml  Output      0 ml  Net    480 ml   Filed Weights   08/04/15 0105  Weight: 61.3 kg (135 lb 2.3 oz)    Exam:   General:  NAD,  resting comfortably  Cardiovascular: Pt  refused to be examined with stethoscope, no cyanosis  Respiratory: pt refused stethoscope examination. Equal chest rise, no wheezes  Abdomen: ND, no guarding  Musculoskeletal: No clubbing cyanosis or edema.  Data Reviewed: Basic Metabolic Panel:  Recent Labs Lab 08/19/15 0309  NA 137  K 4.7  CL 105  CO2 23  GLUCOSE 87  BUN 9  CREATININE 0.68  CALCIUM 8.6*   Liver Function Tests: No results for input(s): AST, ALT, ALKPHOS, BILITOT, PROT, ALBUMIN in the last 168 hours. No results for input(s): LIPASE, AMYLASE in the last 168 hours. No results for input(s): AMMONIA in the last 168 hours. CBC: No results for input(s): WBC, NEUTROABS, HGB, HCT, MCV, PLT in the last 168 hours. Cardiac Enzymes: No results for input(s): CKTOTAL, CKMB, CKMBINDEX, TROPONINI in the last 168 hours. BNP (last 3 results)  Recent Labs  03/18/15 2109 08/03/15 1820  BNP 234.0* 55.1    ProBNP (last 3 results) No results for input(s): PROBNP in the last 8760 hours.  CBG: No results for input(s): GLUCAP in the last 168 hours.  Recent Results (from the past 240 hour(s))  Urine culture     Status: None   Collection Time: 08/14/15 11:40 AM  Result Value Ref Range Status   Specimen Description URINE, CLEAN CATCH  Final   Special Requests NONE  Final   Culture MULTIPLE SPECIES PRESENT, SUGGEST RECOLLECTION  Final   Report Status 08/15/2015 FINAL  Final     Studies: No results found.  Scheduled Meds: . budesonide-formoterol  2 puff Inhalation BID  . DULoxetine  60 mg Oral Daily  . feeding supplement (ENSURE ENLIVE)  237 mL Oral BID BM  . folic acid  1 mg Oral Daily  . heparin  5,000 Units Subcutaneous 3 times per day  . multivitamin with minerals  1 tablet Oral Daily  . nicotine  21 mg Transdermal Daily  . pantoprazole  40 mg Oral Q0600  . QUEtiapine  50 mg Oral TID  . sodium chloride  3 mL Intravenous Q12H  . tiotropium  18 mcg Inhalation Daily   Continuous Infusions:    Principal  Problem:   Psychosis Active Problems:   Alcohol dependence   Alcohol withdrawal   Protein-calorie malnutrition, severe   COPD exacerbation   Hyponatremia   Hyperglycemia   Hypokalemia   Acute respiratory failure with hypoxia   Severe protein-calorie malnutrition   Hepatitis C antibody test positive   Elevated LFTs   Hyperkalemia   Hypomagnesemia   Protein-calorie malnutrition    Time spent: 35 minutes    [name] M.D. [facility] Pager [phone]. If 7PM-7AM, please contact night-coverage at [email or web address], password [facility] 08/23/2015, 2:20 PM  LOS: 20 days

## 2015-08-23 NOTE — Consult Note (Signed)
[facility] Face-to-Face Psychiatry Consult follow up  Reason for Consult:  Psychosis and agitation/aggression Referring Physician:  Dr. Janee Morn Patient Identification: Rhonda Mitchell MRN:  [id number] Principal Diagnosis: Psychosis Diagnosis:   Patient Active Problem List   Diagnosis Date Noted  . Psychosis [F29] 08/21/2015  . Hypomagnesemia [E83.42]   . Protein-calorie malnutrition [E46]   . Hepatitis C antibody test positive [R89.4]   . Elevated LFTs [R79.89]   . Hyperkalemia [E87.5]   . Severe protein-calorie malnutrition [E43]   . Acute respiratory failure with hypoxia [J96.01] 08/03/2015  . Septic olecranon bursitis of right elbow [M70.21, B96.89] 05/08/2015  . Septic bursitis of elbow [M71.129] 05/07/2015  . Thrombocytopenia [D69.6] 05/07/2015  . Chronic hepatitis C without hepatic coma [B18.2] 05/07/2015  . Hypokalemia [E87.6] 05/07/2015  . Abscess of bursa of elbow [M71.029] 05/07/2015  . COPD exacerbation [J44.1] 03/18/2015  . Hyponatremia [E87.1] 03/18/2015  . Hyperglycemia [R73.9] 03/18/2015  . Alcohol dependence [F10.20] 12/07/2014  . Alcohol withdrawal [F10.239] 12/07/2014  . Protein-calorie malnutrition, severe [E43] 12/07/2014  . Musculoskeletal chest pain [R07.89] 12/07/2014  . Tobacco abuse [Z72.0] 12/07/2014  . Carbuncle and furuncle [L02.92, L02.93] 12/07/2014  . Macrocytic anemia [D53.9] 12/07/2014  . Chest pain [R07.9] 11/30/2014  . Tachycardia [R00.0] 11/30/2014  . COPD (chronic obstructive pulmonary disease) [J44.9] 11/30/2014  . Cellulitis and abscess [L03.90, L02.91] 11/30/2014  . Atypical chest pain [R07.89]   . Dehydration [E86.0]     Total Time spent with patient: 30 minutes  Subjective:   Rhonda Mitchell is a 54 y.o. female admitted with alcohol intoxication.  HPI:  Rhonda Mitchell is a 54 years old female seen face-to-face for psychiatric consultation and evaluation of alcohol intoxication and withdrawal symptoms and capacity evaluation.  Patient is a poor historian and not responded most of the questions asked today. Patient is awake and easily responding to verbal stimuli but decided not to talk today. Patient appeared lying in her bed, calm and without distress. Information for this evaluation up 10 from the available medical records and case discussed with the staff in the room and also staff RN. Patient has been poorly responsive to the verbal stimuli, difficult to redirect, easily getting irritable, confused and combative. Patient was known to have false twice yesterday. Reportedly patient has been suffering with severe alcohol intoxication, dependence and abuse. Patient blood alcohol level on arrival was 436. Patient has previous hospital visits with the alcohol intoxication. Reportedly patient boyfriend has been worried about patient inability to manage herself without rehabilitation treatment after medically stabilized. Based on my current evaluation patient does not meet criteria for capacity to make her own medical decisions or living arrangements. Patient benefit from Ativan alcohol detox treatment and also inpatient rehabilitation treatment: Medically stable. On the morning of 08/07/2015, the patient became increasingly agitated requiring additional doses of Ativan. On 08/08/2015, critical care medicine was consulted due to continued agitation and hallucinations. Fortunately, the patient improved without the use of Precedex. She was transferred back to the medical floor on 08/10/2015. Patient continued to be poorly cooperative with the staff and current treatment recommendations.   Interval History: Patient seen for psychiatric consultation follow-up, case discussed with Lovette Cliche, LCSW, patient sitter and staff RN. Patient  has been disorganized, delusional, paranoid, restless, agitated, aggressive towards staff  required soft restraints. Patient is also has frequent falls and trying to walk without redirection's. Patient Has  Been tangential and circumstantial feels like she is at her home and people are coming into her home and bothering her.  Patient significant other stated that she has long history of mental illness and self medicated with drug of abuse over the years. Patient has significant deterioration in her mental status since Friday as per staff taking care off her. She is inappropriately grabbing staff and to hug them.   She has poor insight, judgment and impulse control. Patient  fianccern about her safety, know that she won't be able to care for herself. Staff reported patient has been acting out this morning, irritable, agitation or aggressive and restless on multiple occasions. She has been free from alcohol withdrawal symptoms.   Patient has long history of polysubstance abuse and recent alcohol dependence. Patient has two DUI's and pending court date in October 4, 16 as per significant other who requested a letter about her current status to a Clinical research associate.   Patient meet criteria for long-term psychiatric hospitalization secondary to deterioration in her mental health status, crisis stabilization and safety monitoring and appropriate medication management and unable to care for herself.  Past Medical History:  Past Medical History  Diagnosis Date  . COPD (chronic obstructive pulmonary disease)   . Anxiety   . Shortness of breath   . Arthritis   . GERD (gastroesophageal reflux disease)   . Full dentures   . Insomnia   . Alcohol abuse   . Hepatitis C   . Alcohol abuse   . History of MRSA infection   . History of encephalopathy     Past Surgical History  Procedure Laterality Date  . Arm surgery    . Hand surgery  1990    both rt/lt carpal tunnels  . Shoulder surgery      right and left-age [age]  . Orif ulnar / radial shaft fracture  1990    right-got infection-had total 10 surgeies 1990  . Orif ulnar fracture Left 05/23/2014    Procedure: OPEN REDUCTION INTERNAL FIXATION (ORIF) LEFT ULNAR  FRACTURE;  Surgeon: Harvie Junior, MD;  Location: [facility];  Service: Orthopedics;  Laterality: Left;  . I&d extremity Right 05/07/2015    Procedure: IRRIGATION AND DEBRIDEMENT  ELBOW ;  Surgeon: Dominica Severin, MD;  Location: [facility] OR;  Service: Orthopedics;  Laterality: Right;   Family History:  Family History  Problem Relation Age of Onset  . Breast cancer Mother    Social History:  History  Alcohol Use  . Yes    Comment: 1/5th Vodka every day      History  Drug Use No    Social History   Social History  . Marital Status: Legally Separated    Spouse Name: N/A  . Number of Children: N/A  . Years of Education: N/A   Social History Main Topics  . Smoking status: Current Every Day Smoker -- 1.00 packs/day for 20 years    Types: Cigarettes  . Smokeless tobacco: Never Used  . Alcohol Use: Yes     Comment: 1/5th Vodka every day   . Drug Use: No  . Sexual Activity: Yes    Birth Control/ Protection: Post-menopausal   Other Topics Concern  . None   Social History Narrative   Additional Social History:                          Allergies:  No Known Allergies  Labs:  No results found for this or any previous visit (from the past 48 hour(s)).  Vitals: Blood pressure 148/88, pulse 116, temperature 98.3 F (36.8  C), temperature source Oral, resp. rate 20, height 5' (1.524 m), weight 61.3 kg (135 lb 2.3 oz), SpO2 95 %.  Risk to Self: Is patient at risk for suicide?: No Risk to Others:   Prior Inpatient Therapy:   Prior Outpatient Therapy:    Current Facility-Administered Medications  Medication Dose Route Frequency Provider Last Rate Last Dose  . acetaminophen (TYLENOL) tablet 650 mg  650 mg Oral Q6H PRN Catarina Hartshorn, MD   650 mg at 08/22/15 1856  . budesonide-formoterol (SYMBICORT) 80-4.5 MCG/ACT inhaler 2 puff  2 puff Inhalation BID Marinda Elk, MD   2 puff at 08/23/15 [phone]  . DULoxetine (CYMBALTA) DR capsule 60 mg  60 mg Oral Daily Marinda Elk, MD   60 mg at 08/23/15 0902  . feeding supplement (ENSURE ENLIVE) (ENSURE ENLIVE) liquid 237 mL  237 mL Oral BID BM Jenifer A Williams, RD   237 mL at 08/23/15 0900  . folic acid (FOLVITE) tablet 1 mg  1 mg Oral Daily Bertram Millard, RPH   1 mg at 08/23/15 0900  . gi cocktail (Maalox,Lidocaine,Donnatal)  30 mL Oral TID PRN Rodolph Bong, MD   30 mL at 08/16/15 2213  . heparin injection 5,000 Units  5,000 Units Subcutaneous 3 times per day Marinda Elk, MD   5,000 Units at 08/22/15 2054  . LORazepam (ATIVAN) injection 2-4 mg  2-4 mg Intravenous Q2H PRN Catarina Hartshorn, MD   4 mg at 08/23/15 0631   Or  . LORazepam (ATIVAN) tablet 2-4 mg  2-4 mg Oral Q2H PRN Catarina Hartshorn, MD   4 mg at 08/23/15 0903  . multivitamin with minerals tablet 1 tablet  1 tablet Oral Daily Rahul Astrid Drafts, PA-C   1 tablet at 08/23/15 0902  . nicotine (NICODERM CQ - dosed in mg/24 hours) patch 21 mg  21 mg Transdermal Daily Drema Dallas, MD   21 mg at 08/23/15 0909  . ondansetron (ZOFRAN-ODT) disintegrating tablet 4 mg  4 mg Oral Q8H PRN Jinger Neighbors, NP   4 mg at 08/16/15 2213  . pantoprazole (PROTONIX) EC tablet 40 mg  40 mg Oral Q0600 Rodolph Bong, MD   40 mg at 08/22/15 0606  . QUEtiapine (SEROQUEL) tablet 50 mg  50 mg Oral BID Leata Mouse, MD   50 mg at 08/23/15 0902  . sodium chloride 0.9 % injection 3 mL  3 mL Intravenous Q12H Marinda Elk, MD   3 mL at 08/23/15 1000  . tiotropium (SPIRIVA) inhalation capsule 18 mcg  18 mcg Inhalation Daily Marinda Elk, MD   18 mcg at 08/23/15 [phone]  . traMADol (ULTRAM) tablet 50 mg  50 mg Oral Q6H PRN Rodolph Bong, MD   50 mg at 08/22/15 2054    Musculoskeletal: Strength & Muscle Tone: decreased Gait & Station: unable to stand Patient leans: Front  Psychiatric Specialty Exam: Physical Exam   ROS   Blood pressure 148/88, pulse 116, temperature 98.3 F (36.8 C), temperature source Oral, resp. rate 20, height 5' (1.524 m),  weight 61.3 kg (135 lb 2.3 oz), SpO2 95 %.Body mass index is 26.39 kg/(m^2).  General Appearance: Guarded  Eye Contact::  Good  Speech:  Blocked, Garbled and Slow  Volume:  Normal  Mood:  Depressed and Irritable  Affect:  Non-Congruent, Inappropriate and Labile  Thought Process:  Irrelevant and Loose  Orientation:  Negative  Thought Content:  Rumination  Suicidal Thoughts:  No  Homicidal  Thoughts:  No  Memory:  Immediate;   Poor  Judgement:  Poor  Insight:  Shallow  Psychomotor Activity:  Decreased  Concentration:  Fair  Recall:  [employer] of Knowledge:Fair  Language: Fair  Akathisia:  Negative  Handed:  Right  AIMS (if indicated):     Assets:  Communication Skills  ADL's:  Impaired  Cognition: Impaired,  Moderate  Sleep:      Medical Decision Making: New problem, with additional work up planned, Review of Psycho-Social Stressors (1), Review or order clinical lab tests (1), Established Problem, Worsening (2), Review of Last Therapy Session (1), Review or order medicine tests (1), Review of Medication Regimen & Side Effects (2) and Review of New Medication or Change in Dosage (2)  Treatment Plan Summary: Patient with psychosis and behavioral problems. Patient cannot contract for safety as she was not able to establish safety during this hospitalization. She has poor insight, judgment and impulse control. Patient significant other concern about her safety, and deterioration of her mental health state since completing her detox treatment.     Daily contact with patient to assess and evaluate symptoms and progress in treatment and Medication management  Plan:  Patient has no capacity to make her own medical decision and living arrangement as of my evaluation today.  Patient can not care for her self due to increased psychosis, delusion and agitation Continue IVC due to agitation and aggression towards staff members Case discussed with Psychiatric social service - refer to [facility]   Safety concerns: Continue safety sitter Increase seroquel 50 mg PO TID for agitation and delusions Recommend psychiatric Inpatient admission when medically cleared. Supportive therapy provided about ongoing stressors.  Appreciate psychiatric consultation and follow up as clinically required Please contact [phone] or [phone] if needs further assistance  Disposition: Case discussed with the psychiatric social service and Dr. Janee Morn regarding safe disposition plan. Referred to [facility] for further psychiatric care at this time.    Sailor Hevia,JANARDHAHA R. 08/23/2015 9:36 AM

## 2015-08-23 NOTE — Progress Notes (Signed)
CSW spoke to Brett Canales- Intake at [facility]. He confirmed that patient remains on their wait list of admission.  CSW services will continue to monitor for stability and disposition.  Remains, at this time- under IVC.  Lorri Frederick. Jaci Lazier, [place] [phone] (coverage)

## 2015-08-23 NOTE — Progress Notes (Signed)
Physical Therapy Discharge Patient Details Name: Rhonda Mitchell MRN: [id number] DOB: [birth year] Today's Date: 08/23/2015  Attempted to see patient for PT session but pt flailing in the bed, reports of increased aggression by sitter at bedside and pt in 4 point restraints for safety and deemed unsafe at this time. Pt has been unable to participate in therapy sessions last 3 attempts due to the above behavior. Will D/c PT services at this time as pt awaiting placement at psychiatric unit.   Patient discharged from PT services secondary to inability to participate in functional skilled PT intervention due to aggressive behaviors and need for psychiatric care. Pt has been with sitter and in 4 point restraints with medication used to calm patient last several days and is not making progress towards overall goals set to address mobility and balance.  Please see latest therapy progress note for current level of functioning and progress toward goals.    Progress and discharge plan discussed with patient and/or caregiver: Patient unable to participate in discharge planning and no caregivers available   Karolee Stamps Darrol Poke, PT, DPT  08/23/2015, 1:45 PM

## 2015-08-24 NOTE — Progress Notes (Signed)
Patient discussed with Simonne Come- LCSW- Behavioral Health. He stated that he spoke with [facility] today- patient remains on waiting list.  Lorri Frederick. Jaci Lazier, [place] [phone] (weekend coverage)

## 2015-08-24 NOTE — Progress Notes (Signed)
[facility] PROGRESS NOTE  Rhonda Mitchell UJW:[id number] DOB: [birth year] DOA: 08/03/2015 PCP: [name], MD  Brief history 54 year old female with a history of alcohol abuse (1 pint vodka daily), tobacco abuse, COPD (non-oxygen dependent), hepatitis C presented with shortness of breath for several days prior to admission. The patient was encephalopathic/obtunded and unable to provide history in the emergency department. She was found to be hypoxic and placed on nonrebreather. She continued to have desaturation into the 70s despite albuterol nebulizers. Alcohol level was 436 at the time of admission. It was thought that her respiratory failure was likely due to respiratory depression from alcohol intoxication. On the morning of 08/07/2015, the patient became increasingly agitated requiring additional doses of Ativan. On 08/08/2015, critical care medicine was consulted due to continued agitation and hallucinations. Fortunately, the patient improved without the use of Precedex. She was transferred back to the medical floor on 08/10/2015. Since then, the patient has been lucid, alert and oriented, but has bouts of impulsiveness. Patient also with bouts of agitation and aggression towards staff members. Patient has been IVC. Patient has been denied by [facility] for chemical dependency rehabilitation. Patient does have a Recruitment consultant. Patient's Seroquel dose has been increased to 50 mg twice daily for agitation and delusions per psychiatry who recommended inpatient psychiatric admission. Patient has no capacity and unsafe for patient to be home by herself. Patient's boyfriend due to his primary job responsibilities will be able to care for her on a daily basis. Awaiting inpatient psychiatric bed.  Assessment/Plan:  Acute respiratory failure with hypoxia due to hypoventilation and respiratory depression from alcoholic intoxication: - resolved continue current regimen.  Alcohol dependence/ Alcohol  withdrawal/ Delirium -Pt remains aggressive and altered -Psychiatry: Dr. Elsie Saas on board please refer to his notes -PT IS MEDICALLY STABLE TO BE TRANSFERRED TO PSYCHIATRIC INPATIENT FACILITY WHEN BED AVAILABLE. Still waiting for bed  Tobacco abuse - Continue Nicoderm patch - still smoking--No desire to quit  Hyperglycemia - 8/14 Hemoglobin A1c= 5.6   Hypomagnesemia/Hypokalemia -repleted  Protein-calorie malnutrition, severe - Patient has been seen by nutrition. Continue current supplementation with boost breeze.   Hyponatremia - Improved with hydration.  Elevated LFTs: -Likely due to alcohol abuse -LFTs trending down.  - reassess CMP next am.  Macrocytic anemia: -Anemia panel unrevealing  -Likely due to continued alcohol use -B12 and folate are WNL - reassess cbc next am.  Infectious/Chronic Hep C without Coma -HIV negative -RPR negative -Acute hepatitis panel positive hepatitis C virus antibody -HCV Quant viral load = 285,000 -Outpatient follow-up.  COPD Continue Spiriva and Symbicort.   Code Status: Full Family Communication: Updated patient. No family at bedside. Disposition Plan: Needs psychiatric inpatient admission when bed available. Patient has been denied [facility] for chemical dependency rehabilitation. Patient with no capacity and patient cannot care for herself.   Consultants:  Psychiatry: Dr. Elsie Saas 08/13/2015  PCCCM : Dr. Vassie Loll 08/08/2015  Procedures:  Acute abdominal series 08/03/2015    Antibiotics:  None  HPI/Subjective: Patient still having episodes of agitation.  Objective: Filed Vitals:   08/24/15 0556  BP: 141/109  Pulse: 92  Temp: 98.4 F (36.9 C)  Resp: 21    Intake/Output Summary (Last 24 hours) at 08/24/15 1526 Last data filed at 08/24/15 0812  Gross per 24 hour  Intake    576 ml  Output      0 ml  Net    576 ml   Filed Weights   08/04/15 0105  Weight: 61.3 kg (135 lb 2.3  oz)     Exam:   General:  NAD, alert and awake  Cardiovascular: Pt refused to be examined with stethoscope, no cyanosis  Respiratory: pt refused stethoscope examination. Equal chest rise, no wheezes  Abdomen: ND, no guarding  Musculoskeletal: No clubbing cyanosis or edema.  Data Reviewed: Basic Metabolic Panel:  Recent Labs Lab 08/19/15 0309  NA 137  K 4.7  CL 105  CO2 23  GLUCOSE 87  BUN 9  CREATININE 0.68  CALCIUM 8.6*   Liver Function Tests: No results for input(s): AST, ALT, ALKPHOS, BILITOT, PROT, ALBUMIN in the last 168 hours. No results for input(s): LIPASE, AMYLASE in the last 168 hours. No results for input(s): AMMONIA in the last 168 hours. CBC: No results for input(s): WBC, NEUTROABS, HGB, HCT, MCV, PLT in the last 168 hours. Cardiac Enzymes: No results for input(s): CKTOTAL, CKMB, CKMBINDEX, TROPONINI in the last 168 hours. BNP (last 3 results)  Recent Labs  03/18/15 2109 08/03/15 1820  BNP 234.0* 55.1    ProBNP (last 3 results) No results for input(s): PROBNP in the last 8760 hours.  CBG: No results for input(s): GLUCAP in the last 168 hours.  No results found for this or any previous visit (from the past 240 hour(s)).   Studies: No results found.  Scheduled Meds: . budesonide-formoterol  2 puff Inhalation BID  . DULoxetine  60 mg Oral Daily  . feeding supplement (ENSURE ENLIVE)  237 mL Oral BID BM  . folic acid  1 mg Oral Daily  . heparin  5,000 Units Subcutaneous 3 times per day  . multivitamin with minerals  1 tablet Oral Daily  . nicotine  21 mg Transdermal Daily  . pantoprazole  40 mg Oral Q0600  . QUEtiapine  50 mg Oral TID  . sodium chloride  3 mL Intravenous Q12H  . tiotropium  18 mcg Inhalation Daily   Continuous Infusions:    Principal Problem:   Psychosis Active Problems:   Alcohol dependence   Alcohol withdrawal   Protein-calorie malnutrition, severe   COPD exacerbation   Hyponatremia   Hyperglycemia    Hypokalemia   Acute respiratory failure with hypoxia   Severe protein-calorie malnutrition   Hepatitis C antibody test positive   Elevated LFTs   Hyperkalemia   Hypomagnesemia   Protein-calorie malnutrition    Time spent: 35 minutes    [name] M.D. [facility] Pager [phone]. If 7PM-7AM, please contact night-coverage at [email or web address], password [facility] 08/24/2015, 3:26 PM  LOS: 21 days

## 2015-08-25 LAB — COMPREHENSIVE METABOLIC PANEL
ALBUMIN: 3.3 g/dL — AB (ref 3.5–5.0)
ALK PHOS: 72 U/L (ref 38–126)
ALT: 49 U/L (ref 14–54)
AST: 69 U/L — AB (ref 15–41)
Anion gap: 10 (ref 5–15)
BILIRUBIN TOTAL: 0.6 mg/dL (ref 0.3–1.2)
BUN: 15 mg/dL (ref 6–20)
CO2: 23 mmol/L (ref 22–32)
Calcium: 9.3 mg/dL (ref 8.9–10.3)
Chloride: 102 mmol/L (ref 101–111)
Creatinine, Ser: 0.98 mg/dL (ref 0.44–1.00)
GFR calc Af Amer: >60 mL/min (ref >60)
GFR calc non Af Amer: >60 mL/min (ref >60)
GLUCOSE: 83 mg/dL (ref 65–99)
POTASSIUM: 4.4 mmol/L (ref 3.5–5.1)
Sodium: 135 mmol/L (ref 135–145)
TOTAL PROTEIN: 6.9 g/dL (ref 6.5–8.1)

## 2015-08-25 LAB — CBC
HEMATOCRIT: 31.8 % — AB (ref 36.0–46.0)
Hemoglobin: 10.1 g/dL — ABNORMAL LOW (ref 12.0–15.0)
MCH: 31.3 pg (ref 26.0–34.0)
MCHC: 31.8 g/dL (ref 30.0–36.0)
MCV: 98.5 fL (ref 78.0–100.0)
Platelets: 487 10*3/uL — ABNORMAL HIGH (ref 150–400)
RBC: 3.23 MIL/uL — ABNORMAL LOW (ref 3.87–5.11)
RDW: 18.9 % — AB (ref 11.5–15.5)
WBC: 7.7 10*3/uL (ref 4.0–10.5)
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
## 2015-08-25 NOTE — Progress Notes (Signed)
[facility] PROGRESS NOTE  Shatina Streets WUJ:[id number] DOB: 1961/01/07 DOA: 08/03/2015 PCP: [name], MD  Brief history 54 year old female with a history of alcohol abuse (1 pint vodka daily), tobacco abuse, COPD (non-oxygen dependent), hepatitis C presented with shortness of breath for several days prior to admission. The patient was encephalopathic/obtunded and unable to provide history in the emergency department. She was found to be hypoxic and placed on nonrebreather. She continued to have desaturation into the 70s despite albuterol nebulizers. Alcohol level was 436 at the time of admission. It was thought that her respiratory failure was likely due to respiratory depression from alcohol intoxication. On the morning of 08/07/2015, the patient became increasingly agitated requiring additional doses of Ativan. On 08/08/2015, critical care medicine was consulted due to continued agitation and hallucinations. Fortunately, the patient improved without the use of Precedex. She was transferred back to the medical floor on 08/10/2015. Since then, the patient has been lucid, alert and oriented, but has bouts of impulsiveness. Patient also with bouts of agitation and aggression towards staff members. Patient has been IVC. Patient has been denied by [facility] for chemical dependency rehabilitation. Patient does have a Recruitment consultant. Patient's Seroquel dose has been increased to 50 mg twice daily for agitation and delusions per psychiatry who recommended inpatient psychiatric admission. Patient has no capacity and unsafe for patient to be home by herself. Patient's boyfriend due to his primary job responsibilities will be able to care for her on a daily basis. Awaiting inpatient psychiatric bed.  Assessment/Plan:  Acute respiratory failure with hypoxia due to hypoventilation and respiratory depression from alcoholic intoxication: - resolved continue current regimen.  Alcohol dependence/ Alcohol  withdrawal/ Delirium -Pt remains aggressive and altered -Psychiatry: Dr. Elsie Saas on board please refer to his notes -PT IS MEDICALLY STABLE TO BE TRANSFERRED TO PSYCHIATRIC INPATIENT FACILITY WHEN BED AVAILABLE. Still waiting for bed  Tobacco abuse - Continue Nicoderm patch - still smoking--No desire to quit  Hyperglycemia - 8/14 Hemoglobin A1c= 5.6   Protein-calorie malnutrition, severe - Patient has been seen by nutrition. Continue current supplementation with boost breeze.   Hyponatremia - Improved with hydration.  Elevated LFTs: -Likely due to alcohol abuse - LFT's stable  Macrocytic anemia: -Anemia panel unrevealing  -Likely due to continued alcohol use -B12 and folate are WNL - hgb level improved when compared to last  Infectious/Chronic Hep C without Coma -HIV negative -RPR negative -Acute hepatitis panel positive hepatitis C virus antibody -HCV Quant viral load = 285,000 -Outpatient follow-up.  COPD Continue Spiriva and Symbicort.  Code Status: Full Family Communication: Updated patient. No family at bedside. Disposition Plan: Needs psychiatric inpatient admission when bed available. Patient has been denied [facility] for chemical dependency rehabilitation. Patient with no capacity and patient cannot care for herself.   Consultants:  Psychiatry: Dr. Elsie Saas 08/13/2015  PCCCM : Dr. Vassie Loll 08/08/2015  Procedures:  Acute abdominal series 08/03/2015    Antibiotics:  None  HPI/Subjective: Patient still having episodes of agitation.  Objective: Filed Vitals:   08/24/15 2222  BP: 105/53  Pulse: 78  Temp: 97.8 F (36.6 C)  Resp: 19    Intake/Output Summary (Last 24 hours) at 08/25/15 1505 Last data filed at 08/25/15 0605  Gross per 24 hour  Intake    210 ml  Output    225 ml  Net    -15 ml   Filed Weights   08/04/15 0105 08/25/15 0615  Weight: 61.3 kg (135 lb 2.3 oz) 40.869 kg (90 lb 1.6 oz)  Exam:   General:  NAD, alert  and awake  CV: No cyanosis  Pulm: no increased wob, no wheezes  Data Reviewed: Basic Metabolic Panel:  Recent Labs Lab 08/19/15 0309 08/25/15 0628  NA 137 135  K 4.7 4.4  CL 105 102  CO2 23 23  GLUCOSE 87 83  BUN 9 15  CREATININE 0.68 0.98  CALCIUM 8.6* 9.3   Liver Function Tests:  Recent Labs Lab 08/25/15 0628  AST 69*  ALT 49  ALKPHOS 72  BILITOT 0.6  PROT 6.9  ALBUMIN 3.3*   No results for input(s): LIPASE, AMYLASE in the last 168 hours. No results for input(s): AMMONIA in the last 168 hours. CBC:  Recent Labs Lab 08/25/15 0628  WBC 7.7  HGB 10.1*  HCT 31.8*  MCV 98.5  PLT 487*   Cardiac Enzymes: No results for input(s): CKTOTAL, CKMB, CKMBINDEX, TROPONINI in the last 168 hours. BNP (last 3 results)  Recent Labs  03/18/15 2109 08/03/15 1820  BNP 234.0* 55.1    ProBNP (last 3 results) No results for input(s): PROBNP in the last 8760 hours.  CBG: No results for input(s): GLUCAP in the last 168 hours.  No results found for this or any previous visit (from the past 240 hour(s)).   Studies: No results found.  Scheduled Meds: . budesonide-formoterol  2 puff Inhalation BID  . DULoxetine  60 mg Oral Daily  . feeding supplement (ENSURE ENLIVE)  237 mL Oral BID BM  . folic acid  1 mg Oral Daily  . heparin  5,000 Units Subcutaneous 3 times per day  . multivitamin with minerals  1 tablet Oral Daily  . nicotine  21 mg Transdermal Daily  . pantoprazole  40 mg Oral Q0600  . QUEtiapine  50 mg Oral TID  . sodium chloride  3 mL Intravenous Q12H  . tiotropium  18 mcg Inhalation Daily   Continuous Infusions:    Principal Problem:   Psychosis Active Problems:   Alcohol dependence   Alcohol withdrawal   Protein-calorie malnutrition, severe   COPD exacerbation   Hyponatremia   Hyperglycemia   Hypokalemia   Acute respiratory failure with hypoxia   Severe protein-calorie malnutrition   Hepatitis C antibody test positive   Elevated LFTs    Hyperkalemia   Hypomagnesemia   Protein-calorie malnutrition    Time spent: 10 minutes    [name] M.D. [facility] Pager [phone]. If 7PM-7AM, please contact night-coverage at [email or web address], password [facility] 08/25/2015, 3:05 PM  LOS: 22 days

## 2015-08-26 NOTE — Progress Notes (Signed)
[facility] PROGRESS NOTE  Rhonda Mitchell ZOX:[id number] DOB: 11/04/1961 DOA: 08/03/2015 PCP: [name], MD  Brief history 54 year old female with a history of alcohol abuse (1 pint vodka daily), tobacco abuse, COPD (non-oxygen dependent), hepatitis C presented with shortness of breath for several days prior to admission. The patient was encephalopathic/obtunded and unable to provide history in the emergency department. She was found to be hypoxic and placed on nonrebreather. She continued to have desaturation into the 70s despite albuterol nebulizers. Alcohol level was 436 at the time of admission. It was thought that her respiratory failure was likely due to respiratory depression from alcohol intoxication. On the morning of 08/07/2015, the patient became increasingly agitated requiring additional doses of Ativan. On 08/08/2015, critical care medicine was consulted due to continued agitation and hallucinations. Fortunately, the patient improved without the use of Precedex. She was transferred back to the medical floor on 08/10/2015. Since then, the patient has been lucid, alert and oriented, but has bouts of impulsiveness. Patient also with bouts of agitation and aggression towards staff members. Patient has been IVC. Patient has been denied by [facility] for chemical dependency rehabilitation. Patient does have a Recruitment consultant. Patient's Seroquel dose has been increased to 50 mg twice daily for agitation and delusions per psychiatry who recommended inpatient psychiatric admission. Patient has no capacity and unsafe for patient to be home by herself. Patient's boyfriend due to his primary job responsibilities will be able to care for her on a daily basis. Awaiting inpatient psychiatric bed.  Assessment/Plan:  Acute respiratory failure with hypoxia due to hypoventilation and respiratory depression from alcoholic intoxication: - resolved continue current regimen.  Alcohol dependence/ Alcohol  withdrawal/ Delirium -Pt remains aggressive and altered -Psychiatry: Dr. Elsie Saas on board please refer to his notes -PT IS MEDICALLY STABLE TO BE TRANSFERRED TO PSYCHIATRIC INPATIENT FACILITY WHEN BED AVAILABLE. Still waiting for bed  Tobacco abuse - Continue Nicoderm patch - still smoking--No desire to quit  Hyperglycemia - 8/14 Hemoglobin A1c= 5.6   Protein-calorie malnutrition, severe - Patient has been seen by nutrition. Continue current supplementation with boost breeze.   Hyponatremia - Improved with hydration.  Elevated LFTs: -Likely due to alcohol abuse - LFT's stable  Macrocytic anemia: -Anemia panel unrevealing  -Likely due to continued alcohol use -B12 and folate are WNL - hgb level improved when compared to last  Infectious/Chronic Hep C without Coma -HIV negative -RPR negative -Acute hepatitis panel positive hepatitis C virus antibody -HCV Quant viral load = 285,000 -Outpatient follow-up.  COPD Continue Spiriva and Symbicort.  Code Status: Full Family Communication: Updated patient. No family at bedside. Disposition Plan: Needs psychiatric inpatient admission when bed available. Patient has been denied [facility] for chemical dependency rehabilitation. Patient with no capacity and patient cannot care for herself.   Consultants:  Psychiatry: Dr. Elsie Saas 08/13/2015  PCCCM : Dr. Vassie Loll 08/08/2015  Procedures:  Acute abdominal series 08/03/2015    Antibiotics:  None  HPI/Subjective: Patient still having episodes of agitation with staff requiring non violent restraints.  Objective: Filed Vitals:   08/26/15 1334  BP: 135/71  Pulse: 74  Temp: 97.5 F (36.4 C)  Resp: 22    Intake/Output Summary (Last 24 hours) at 08/26/15 1440 Last data filed at 08/26/15 1335  Gross per 24 hour  Intake     90 ml  Output      0 ml  Net     90 ml   Filed Weights   08/04/15 0105 08/25/15 0615  Weight: 61.3 kg (  135 lb 2.3 oz) 40.869 kg (90 lb 1.6  oz)    Exam:   General:  NAD, alert and awake  CV: No cyanosis  Pulm: no increased wob, no wheezes  Data Reviewed: Basic Metabolic Panel:  Recent Labs Lab 08/25/15 0628  NA 135  K 4.4  CL 102  CO2 23  GLUCOSE 83  BUN 15  CREATININE 0.98  CALCIUM 9.3   Liver Function Tests:  Recent Labs Lab 08/25/15 0628  AST 69*  ALT 49  ALKPHOS 72  BILITOT 0.6  PROT 6.9  ALBUMIN 3.3*   No results for input(s): LIPASE, AMYLASE in the last 168 hours. No results for input(s): AMMONIA in the last 168 hours. CBC:  Recent Labs Lab 08/25/15 0628  WBC 7.7  HGB 10.1*  HCT 31.8*  MCV 98.5  PLT 487*   Cardiac Enzymes: No results for input(s): CKTOTAL, CKMB, CKMBINDEX, TROPONINI in the last 168 hours. BNP (last 3 results)  Recent Labs  03/18/15 2109 08/03/15 1820  BNP 234.0* 55.1    ProBNP (last 3 results) No results for input(s): PROBNP in the last 8760 hours.  CBG: No results for input(s): GLUCAP in the last 168 hours.  No results found for this or any previous visit (from the past 240 hour(s)).   Studies: No results found.  Scheduled Meds: . budesonide-formoterol  2 puff Inhalation BID  . DULoxetine  60 mg Oral Daily  . feeding supplement (ENSURE ENLIVE)  237 mL Oral BID BM  . folic acid  1 mg Oral Daily  . heparin  5,000 Units Subcutaneous 3 times per day  . multivitamin with minerals  1 tablet Oral Daily  . nicotine  21 mg Transdermal Daily  . pantoprazole  40 mg Oral Q0600  . QUEtiapine  50 mg Oral TID  . sodium chloride  3 mL Intravenous Q12H  . tiotropium  18 mcg Inhalation Daily   Continuous Infusions:    Principal Problem:   Psychosis Active Problems:   Alcohol dependence   Alcohol withdrawal   Protein-calorie malnutrition, severe   COPD exacerbation   Hyponatremia   Hyperglycemia   Hypokalemia   Acute respiratory failure with hypoxia   Severe protein-calorie malnutrition   Hepatitis C antibody test positive   Elevated LFTs    Hyperkalemia   Hypomagnesemia   Protein-calorie malnutrition    Time spent: 10 minutes    [name] M.D. [facility] Pager [phone]. If 7PM-7AM, please contact night-coverage at [email or web address], password [facility] 08/26/2015, 2:40 PM  LOS: 23 days

## 2015-08-27 NOTE — Care Management Note (Signed)
Case Management Note  Patient Details  Name: Dylann Layne MRN: [id number] Date of Birth: [birth year]  Subjective/Objective:                    Action/Plan:   Expected Discharge Date:                  Expected Discharge Plan:  [facility]  In-House Referral:  Clinical Social Work  Discharge planning Services  CM Consult  Post Acute Care Choice:    Choice offered to:     DME Arranged:    DME Agency:     HH Arranged:    HH Agency:     Status of Service:  In process, will continue to follow  Medicare Important Message Given:    Date Medicare IM Given:    Medicare IM give by:    Date Additional Medicare IM Given:    Additional Medicare Important Message give by:     If discussed at Long Length of Stay Meetings, dates discussed:  08-27-15   Additional Comments:  Kingsley Plan, RN 08/27/2015, 2:39 PM

## 2015-08-27 NOTE — Progress Notes (Signed)
Nutrition Follow-up  DOCUMENTATION CODES:   Underweight  INTERVENTION:   -Continue MVI -Continue Glucerna Shake po TID, each supplement provides 220 kcal and 10 grams of protein -Magic Cup with meals  NUTRITION DIAGNOSIS:   Increased nutrient needs related to chronic illness as evidenced by estimated needs.  Ongoing  GOAL:   Patient will meet greater than or equal to 90% of their needs  Unmet  MONITOR:   PO intake, Supplement acceptance, Labs, Weight trends, Skin, I & O's  REASON FOR ASSESSMENT:   Consult Assessment of nutrition requirement/status  ASSESSMENT:   Rhonda Mitchell is a 54 y.o. female past medical history of alcohol abuse ongoing tobacco abuse and COPD non-oxygen dependent at comes in for shortness of breath that started several days prior to admission. The patient is sleepy and cannot provide a history so most of the history is obtained from the chart. As per chart the patient had been short of breath last several days. She denies any cough or fever.  Pt remains combative towards staff, on restraints.   Pt being wheeled around the hallway with sitter at time of visit.   Spoke with RN, who reports intake remains poor. PO: 0-75% per doc flowsheets (averaging 25% intake at meals). Per RN, pt ate some yogurt this AM. She has been refusing MVI and Ensure supplements on and off.   Labs reviewed.    CSW and psychiatry following. Pt is on priority waiting list at [facility].   Diet Order:  Diet regular Room service appropriate?: Yes; Fluid consistency:: Thin  Skin:  Reviewed, no issues  Last BM:  08/21/15  Height:   Ht Readings from Last 1 Encounters:  08/04/15 5' (1.524 m)    Weight:   Wt Readings from Last 1 Encounters:  08/25/15 90 lb 1.6 oz (40.869 kg)    Ideal Body Weight:  45.5 kg  BMI:  Body mass index is 17.6 kg/(m^2).  Estimated Nutritional Needs:   Kcal:  1250-1450  Protein:  50-65 grams  Fluid:  >1.2  L  EDUCATION NEEDS:   No education needs identified at this time  Nashawn Hillock A. Mayford Knife, RD, LDN, CDE Pager: [phone] After hours Pager: [phone]

## 2015-08-27 NOTE — Progress Notes (Addendum)
Update:  Patient is on priority wait list for [facility] per attending at [facility]. CM updated, medical director Jacky Kindle and SW Director [employer].   Spoke with patient's fiance (of 10 years) Lavena Bullion ([phone]) Fayrene Fearing reports he saw patient today and is not herself, not really oriented to Fayrene Fearing or asking about all her animals, Fayrene Fearing reports she is much more sedated and getting worse.  Fayrene Fearing is agreeable to apply for social security for patient as financial counselors are going to submit her medicaid application. Call placed back to [facility] with Financial Counseling and made her aware. Fayrene Fearing reports he will need some help, thus would like to set something up with [facility]. Fayrene Fearing reports she has not worked in over 10 years, last job was in [place].  Fayrene Fearing to sign paperwork on behalf of patient. Reports they never got married due to finances, but they live together and he is most supportive person in last 10 years for patient.   Patient discussed in LOS meeting. Patient discussed with attending at [facility] and information sent for consideration for priority. Patient remains on the bottom of list per [facility] attending.  Updated clinicals have been faxed due to patient's combative behavior.  Admitting from [facility] to call once decision has been made if patient on priority.  Deretha Emory, MSW Clinical Social Work: Emergency Room [phone]

## 2015-08-27 NOTE — Progress Notes (Signed)
[facility] PROGRESS NOTE  Rhonda Mitchell ZOX:[id number] DOB: 1961/07/09 DOA: 08/03/2015 PCP: [name], MD  Brief history 54 year old female with a history of alcohol abuse (1 pint vodka daily), tobacco abuse, COPD (non-oxygen dependent), hepatitis C presented with shortness of breath for several days prior to admission. The patient was encephalopathic/obtunded and unable to provide history in the emergency department. She was found to be hypoxic and placed on nonrebreather. She continued to have desaturation into the 70s despite albuterol nebulizers. Alcohol level was 436 at the time of admission. It was thought that her respiratory failure was likely due to respiratory depression from alcohol intoxication. On the morning of 08/07/2015, the patient became increasingly agitated requiring additional doses of Ativan. On 08/08/2015, critical care medicine was consulted due to continued agitation and hallucinations. Fortunately, the patient improved without the use of Precedex. She was transferred back to the medical floor on 08/10/2015. Since then, the patient has been lucid, alert and oriented, but has bouts of impulsiveness. Patient also with bouts of agitation and aggression towards staff members. Patient has been IVC. Patient has been denied by [facility] for chemical dependency rehabilitation. Patient does have a Recruitment consultant. Patient's Seroquel dose has been increased to 50 mg twice daily for agitation and delusions per psychiatry who recommended inpatient psychiatric admission. Patient has no capacity and unsafe for patient to be home by herself. Patient's boyfriend due to his primary job responsibilities will be able to care for her on a daily basis. Awaiting inpatient psychiatric bed.  Assessment/Plan:  Acute respiratory failure with hypoxia due to hypoventilation and respiratory depression from alcoholic intoxication: - resolved continue current regimen.  Alcohol dependence/ Alcohol  withdrawal/ Delirium -Pt remains aggressive and altered -Psychiatry: Dr. Elsie Saas on board please refer to his notes -PT IS MEDICALLY STABLE TO BE TRANSFERRED TO PSYCHIATRIC INPATIENT FACILITY WHEN BED AVAILABLE. Still waiting for bed  Tobacco abuse - Continue Nicoderm patch - still smoking--No desire to quit  Hyperglycemia - 8/14 Hemoglobin A1c= 5.6   Protein-calorie malnutrition, severe - Patient has been seen by nutrition. Continue current supplementation with boost breeze.   Elevated LFTs: -Likely due to alcohol abuse - LFT's stable  Macrocytic anemia: -Anemia panel unrevealing  -Likely due to continued alcohol use -B12 and folate are WNL - hgb level improved when compared to last  Infectious/Chronic Hep C without Coma -HIV negative -RPR negative -Acute hepatitis panel positive hepatitis C virus antibody -HCV Quant viral load = 285,000 -Outpatient follow-up.  COPD Continue Spiriva and Symbicort.  Code Status: Full Family Communication: Updated patient. No family at bedside. Disposition Plan: Needs psychiatric inpatient admission when bed available. Patient has been denied [facility] for chemical dependency rehabilitation. Patient with no capacity and patient cannot care for herself.   Consultants:  Psychiatry: Dr. Elsie Saas 08/13/2015  PCCCM : Dr. Vassie Loll 08/08/2015  Procedures:  Acute abdominal series 08/03/2015  Antibiotics:  None  HPI/Subjective: Pt resting comfortably with no new complaints reported to me by staff.  Objective: Filed Vitals:   08/27/15 0944  BP: 135/83  Pulse: 94  Temp: 98.4 F (36.9 C)  Resp: 65    Intake/Output Summary (Last 24 hours) at 08/27/15 1524 Last data filed at 08/26/15 2057  Gross per 24 hour  Intake    210 ml  Output      0 ml  Net    210 ml   Filed Weights   08/04/15 0105 08/25/15 0615  Weight: 61.3 kg (135 lb 2.3 oz) 40.869 kg (90 lb 1.6 oz)  Exam:   General:  Resting comfortably  CV: No  cyanosis  Pulm: no increased wob, no wheezes  Data Reviewed: Basic Metabolic Panel:  Recent Labs Lab 08/25/15 0628  NA 135  K 4.4  CL 102  CO2 23  GLUCOSE 83  BUN 15  CREATININE 0.98  CALCIUM 9.3   Liver Function Tests:  Recent Labs Lab 08/25/15 0628  AST 69*  ALT 49  ALKPHOS 72  BILITOT 0.6  PROT 6.9  ALBUMIN 3.3*   No results for input(s): LIPASE, AMYLASE in the last 168 hours. No results for input(s): AMMONIA in the last 168 hours. CBC:  Recent Labs Lab 08/25/15 0628  WBC 7.7  HGB 10.1*  HCT 31.8*  MCV 98.5  PLT 487*   Cardiac Enzymes: No results for input(s): CKTOTAL, CKMB, CKMBINDEX, TROPONINI in the last 168 hours. BNP (last 3 results)  Recent Labs  03/18/15 2109 08/03/15 1820  BNP 234.0* 55.1    ProBNP (last 3 results) No results for input(s): PROBNP in the last 8760 hours.  CBG: No results for input(s): GLUCAP in the last 168 hours.  No results found for this or any previous visit (from the past 240 hour(s)).   Studies: No results found.  Scheduled Meds: . budesonide-formoterol  2 puff Inhalation BID  . DULoxetine  60 mg Oral Daily  . feeding supplement (ENSURE ENLIVE)  237 mL Oral BID BM  . folic acid  1 mg Oral Daily  . heparin  5,000 Units Subcutaneous 3 times per day  . multivitamin with minerals  1 tablet Oral Daily  . nicotine  21 mg Transdermal Daily  . pantoprazole  40 mg Oral Q0600  . QUEtiapine  50 mg Oral TID  . sodium chloride  3 mL Intravenous Q12H  . tiotropium  18 mcg Inhalation Daily   Continuous Infusions:    Principal Problem:   Psychosis Active Problems:   Alcohol dependence   Alcohol withdrawal   Protein-calorie malnutrition, severe   COPD exacerbation   Hyponatremia   Hyperglycemia   Hypokalemia   Acute respiratory failure with hypoxia   Severe protein-calorie malnutrition   Hepatitis C antibody test positive   Elevated LFTs   Hyperkalemia   Hypomagnesemia   Protein-calorie  malnutrition    Time spent: 10 minutes    [name] M.D. [facility] Pager [phone]. If 7PM-7AM, please contact night-coverage at [email or web address], password [facility] 08/27/2015, 3:24 PM  LOS: 24 days

## 2015-08-28 DIAGNOSIS — J441 Chronic obstructive pulmonary disease with (acute) exacerbation: Secondary | ICD-10-CM

## 2015-08-28 DIAGNOSIS — F29 Unspecified psychosis not due to a substance or known physiological condition: Secondary | ICD-10-CM

## 2015-08-28 DIAGNOSIS — R7989 Other specified abnormal findings of blood chemistry: Secondary | ICD-10-CM

## 2015-08-28 MED ORDER — ENSURE ENLIVE PO LIQD: 237.0000 mL | Freq: Two times a day (BID) | ORAL | Status: DC

## 2015-08-28 MED ORDER — LORAZEPAM 2 MG PO TABS: 4.0000 mg | ORAL_TABLET | Freq: Four times a day (QID) | ORAL | Status: DC | PRN

## 2015-08-28 MED ORDER — ALBUTEROL SULFATE HFA 108 (90 BASE) MCG/ACT IN AERS: 2.0000 | INHALATION_SPRAY | Freq: Four times a day (QID) | RESPIRATORY_TRACT | Status: DC | PRN

## 2015-08-28 MED ORDER — PANTOPRAZOLE SODIUM 40 MG PO TBEC: 40.0000 mg | DELAYED_RELEASE_TABLET | Freq: Every day | ORAL | Status: DC

## 2015-08-28 MED ORDER — QUETIAPINE FUMARATE 50 MG PO TABS: 50.0000 mg | ORAL_TABLET | Freq: Three times a day (TID) | ORAL | Status: DC

## 2015-08-28 MED ORDER — LORAZEPAM 2 MG/ML IJ SOLN
2.0000 mg | Freq: Once | INTRAMUSCULAR | Status: AC
  Administered 2015-08-28: 2 mg via INTRAMUSCULAR

## 2015-08-28 NOTE — Consult Note (Signed)
[facility] Face-to-Face Psychiatry Consult follow up  Reason for Consult:  Psychosis and agitation/aggression Referring Physician:  Dr. Janee Morn Patient Identification: Deniz Eskridge MRN:  [id number] Principal Diagnosis: Psychosis Diagnosis:   Patient Active Problem List   Diagnosis Date Noted  . Psychosis [F29] 08/21/2015  . Hypomagnesemia [E83.42]   . Protein-calorie malnutrition [E46]   . Hepatitis C antibody test positive [R89.4]   . Elevated LFTs [R79.89]   . Hyperkalemia [E87.5]   . Severe protein-calorie malnutrition [E43]   . Acute respiratory failure with hypoxia [J96.01] 08/03/2015  . Septic olecranon bursitis of right elbow [M70.21, B96.89] 05/08/2015  . Septic bursitis of elbow [M71.129] 05/07/2015  . Thrombocytopenia [D69.6] 05/07/2015  . Chronic hepatitis C without hepatic coma [B18.2] 05/07/2015  . Hypokalemia [E87.6] 05/07/2015  . Abscess of bursa of elbow [M71.029] 05/07/2015  . COPD exacerbation [J44.1] 03/18/2015  . Hyponatremia [E87.1] 03/18/2015  . Hyperglycemia [R73.9] 03/18/2015  . Alcohol dependence [F10.20] 12/07/2014  . Alcohol withdrawal [F10.239] 12/07/2014  . Protein-calorie malnutrition, severe [E43] 12/07/2014  . Musculoskeletal chest pain [R07.89] 12/07/2014  . Tobacco abuse [Z72.0] 12/07/2014  . Carbuncle and furuncle [L02.92, L02.93] 12/07/2014  . Macrocytic anemia [D53.9] 12/07/2014  . Chest pain [R07.9] 11/30/2014  . Tachycardia [R00.0] 11/30/2014  . COPD (chronic obstructive pulmonary disease) [J44.9] 11/30/2014  . Cellulitis and abscess [L03.90, L02.91] 11/30/2014  . Atypical chest pain [R07.89]   . Dehydration [E86.0]     Total Time spent with patient: 30 minutes  Subjective:   Julitza Rickles is a 54 y.o. female admitted with alcohol intoxication.  HPI:  Laniqua Torrens is a 54 years old female seen face-to-face for psychiatric consultation and evaluation of alcohol intoxication and withdrawal symptoms and capacity evaluation.  Patient is a poor historian and not responded most of the questions asked today. Patient is awake and easily responding to verbal stimuli but decided not to talk today. Patient appeared lying in her bed, calm and without distress. Information for this evaluation up 10 from the available medical records and case discussed with the staff in the room and also staff RN. Patient has been poorly responsive to the verbal stimuli, difficult to redirect, easily getting irritable, confused and combative. Patient was known to have false twice yesterday. Reportedly patient has been suffering with severe alcohol intoxication, dependence and abuse. Patient blood alcohol level on arrival was 436. Patient has previous hospital visits with the alcohol intoxication. Reportedly patient boyfriend has been worried about patient inability to manage herself without rehabilitation treatment after medically stabilized. Based on my current evaluation patient does not meet criteria for capacity to make her own medical decisions or living arrangements. Patient benefit from Ativan alcohol detox treatment and also inpatient rehabilitation treatment: Medically stable. On the morning of 08/07/2015, the patient became increasingly agitated requiring additional doses of Ativan. On 08/08/2015, critical care medicine was consulted due to continued agitation and hallucinations. Fortunately, the patient improved without the use of Precedex. She was transferred back to the medical floor on 08/10/2015. Patient continued to be poorly cooperative with the staff and current treatment recommendations.   Interval History: Patient seen for psychiatric consultation follow-up, case discussed with Lorelle Formosa and Bonna Gains, patient sitter and staff RN. Patient is accepted at [facility] and waiting for transportation at this time. Patient appeared in her bed with soft restraints and safety sitter at bed side. She is disorganized, delusional, paranoid, restlessness,  agitated, aggressive towards staff  required soft restraints. Patient is also has frequent falls and trying to walk  without redirection's. Patient is tangential and circumstantial feels like she is at her home and people are coming into her home and bothering her. Patient has long history of polysubstance abuse and recent alcohol dependence. Patient has two DUI's and pending court date in October 4, 16 as per significant other who requested a letter about her current status to a Clinical research associate. Patient meet criteria for long-term psychiatric hospitalization secondary to deterioration in her mental health status, crisis stabilization and safety monitoring and appropriate medication management and unable to care for herself.  Past Medical History:  Past Medical History  Diagnosis Date  . COPD (chronic obstructive pulmonary disease)   . Anxiety   . Shortness of breath   . Arthritis   . GERD (gastroesophageal reflux disease)   . Full dentures   . Insomnia   . Alcohol abuse   . Hepatitis C   . Alcohol abuse   . History of MRSA infection   . History of encephalopathy     Past Surgical History  Procedure Laterality Date  . Arm surgery    . Hand surgery  1990    both rt/lt carpal tunnels  . Shoulder surgery      right and left-age [age]  . Orif ulnar / radial shaft fracture  1990    right-got infection-had total 10 surgeies 1990  . Orif ulnar fracture Left 05/23/2014    Procedure: OPEN REDUCTION INTERNAL FIXATION (ORIF) LEFT ULNAR FRACTURE;  Surgeon: Harvie Junior, MD;  Location: [facility];  Service: Orthopedics;  Laterality: Left;  . I&d extremity Right 05/07/2015    Procedure: IRRIGATION AND DEBRIDEMENT  ELBOW ;  Surgeon: Dominica Severin, MD;  Location: [facility] OR;  Service: Orthopedics;  Laterality: Right;   Family History:  Family History  Problem Relation Age of Onset  . Breast cancer Mother    Social History:  History  Alcohol Use  . Yes    Comment: 1/5th Vodka every day       History  Drug Use No    Social History   Social History  . Marital Status: Legally Separated    Spouse Name: N/A  . Number of Children: N/A  . Years of Education: N/A   Social History Main Topics  . Smoking status: Current Every Day Smoker -- 1.00 packs/day for 20 years    Types: Cigarettes  . Smokeless tobacco: Never Used  . Alcohol Use: Yes     Comment: 1/5th Vodka every day   . Drug Use: No  . Sexual Activity: Yes    Birth Control/ Protection: Post-menopausal   Other Topics Concern  . None   Social History Narrative   Additional Social History:                          Allergies:  No Known Allergies  Labs:  No results found for this or any previous visit (from the past 48 hour(s)).  Vitals: Blood pressure 95/41, pulse 87, temperature 97.5 F (36.4 C), temperature source Axillary, resp. rate 50, height 5' (1.524 m), weight 40.869 kg (90 lb 1.6 oz), SpO2 96 %.  Risk to Self: Is patient at risk for suicide?: No Risk to Others:   Prior Inpatient Therapy:   Prior Outpatient Therapy:    Current Facility-Administered Medications  Medication Dose Route Frequency Provider Last Rate Last Dose  . acetaminophen (TYLENOL) tablet 650 mg  650 mg Oral Q6H PRN Catarina Hartshorn, MD   [phone]  mg at 08/25/15 0001  . budesonide-formoterol (SYMBICORT) 80-4.5 MCG/ACT inhaler 2 puff  2 puff Inhalation BID Marinda Elk, MD   2 puff at 08/28/15 1040  . DULoxetine (CYMBALTA) DR capsule 60 mg  60 mg Oral Daily Marinda Elk, MD   60 mg at 08/28/15 0929  . feeding supplement (ENSURE ENLIVE) (ENSURE ENLIVE) liquid 237 mL  237 mL Oral BID BM Jenifer A Williams, RD   237 mL at 08/28/15 0930  . folic acid (FOLVITE) tablet 1 mg  1 mg Oral Daily Bertram Millard, RPH   1 mg at 08/28/15 1610  . gi cocktail (Maalox,Lidocaine,Donnatal)  30 mL Oral TID PRN Rodolph Bong, MD   30 mL at 08/16/15 2213  . heparin injection 5,000 Units  5,000 Units Subcutaneous 3 times per day Marinda Elk, MD   5,000 Units at 08/28/15 1316  . LORazepam (ATIVAN) injection 2-4 mg  2-4 mg Intravenous Q2H PRN Catarina Hartshorn, MD   4 mg at 08/25/15 2321   Or  . LORazepam (ATIVAN) tablet 2-4 mg  2-4 mg Oral Q2H PRN Catarina Hartshorn, MD   4 mg at 08/28/15 1133  . multivitamin with minerals tablet 1 tablet  1 tablet Oral Daily Rahul P Desai, PA-C   1 tablet at 08/28/15 0929  . nicotine (NICODERM CQ - dosed in mg/24 hours) patch 21 mg  21 mg Transdermal Daily Drema Dallas, MD   21 mg at 08/28/15 0930  . ondansetron (ZOFRAN-ODT) disintegrating tablet 4 mg  4 mg Oral Q8H PRN Jinger Neighbors, NP   4 mg at 08/16/15 2213  . pantoprazole (PROTONIX) EC tablet 40 mg  40 mg Oral Q0600 Rodolph Bong, MD   40 mg at 08/28/15 0610  . QUEtiapine (SEROQUEL) tablet 50 mg  50 mg Oral TID Leata Mouse, MD   50 mg at 08/28/15 0929  . sodium chloride 0.9 % injection 3 mL  3 mL Intravenous Q12H Marinda Elk, MD   3 mL at 08/26/15 2124  . tiotropium (SPIRIVA) inhalation capsule 18 mcg  18 mcg Inhalation Daily Marinda Elk, MD   18 mcg at 08/28/15 1040  . traMADol (ULTRAM) tablet 50 mg  50 mg Oral Q6H PRN Rodolph Bong, MD   50 mg at 08/26/15 0136    Musculoskeletal: Strength & Muscle Tone: decreased Gait & Station: unable to stand Patient leans: Front  Psychiatric Specialty Exam: Physical Exam   ROS   Blood pressure 95/41, pulse 87, temperature 97.5 F (36.4 C), temperature source Axillary, resp. rate 50, height 5' (1.524 m), weight 40.869 kg (90 lb 1.6 oz), SpO2 96 %.Body mass index is 17.6 kg/(m^2).  General Appearance: Guarded  Eye Contact::  Good  Speech:  Blocked, Garbled and Slow  Volume:  Normal  Mood:  Depressed and Irritable  Affect:  Non-Congruent, Inappropriate and Labile  Thought Process:  Irrelevant and Loose  Orientation:  Negative  Thought Content:  Rumination  Suicidal Thoughts:  No  Homicidal Thoughts:  No  Memory:  Immediate;   Poor  Judgement:  Poor  Insight:   Shallow  Psychomotor Activity:  Decreased  Concentration:  Fair  Recall:  [employer] of Knowledge:Fair  Language: Fair  Akathisia:  Negative  Handed:  Right  AIMS (if indicated):     Assets:  Communication Skills  ADL's:  Impaired  Cognition: Impaired,  Moderate  Sleep:      Medical Decision Making: New problem, with  additional work up planned, Review of Psycho-Social Stressors (1), Review or order clinical lab tests (1), Established Problem, Worsening (2), Review of Last Therapy Session (1), Review or order medicine tests (1), Review of Medication Regimen & Side Effects (2) and Review of New Medication or Change in Dosage (2)  Treatment Plan Summary: Patient with psychosis, agitation and aggressive behaviors. She has poor insight, judgment and impulse control. Patient significant other concern about her safety, and deterioration of her mental health state since completing her detox treatment.     Daily contact with patient to assess and evaluate symptoms and progress in treatment and Medication management  Plan:  Patient has no capacity to make her own medical decision and living arrangement.  Patient can not care for her self due to psychosis, delusion and agitation Continue is currently placed on involuntary commitment for agitation and aggression towards staff members Case discussed with Psychiatric social service who refer him to [facility]  Patient is accepted and waiting for transportation as per LCSW and Staff RN Safety concerns: Patent attorney continue seroquel 50 mg PO TID for agitation and delusions Recommend psychiatric Inpatient admission when medically cleared. Supportive therapy provided about ongoing stressors.  Appreciate psychiatric consultation and follow up as clinically required Please contact [phone] or [phone] if needs further assistance  Disposition: Case discussed with the psychiatric social service regarding safe disposition plan. Patient meets criteria  for psych hospitalization and referred to [facility] for further psychiatric care at this time.    Felton Buczynski,JANARDHAHA R. 08/28/2015 3:15 PM

## 2015-08-28 NOTE — Progress Notes (Signed)
Discharge to [facility] transported by [facility]. Patient is alert , agitated, kicking and trying to hit staff during transfer.

## 2015-08-28 NOTE — Discharge Summary (Addendum)
Physician Discharge Summary  Rhonda Mitchell ZOX:[id number] DOB: 01/05/1961 DOA: 08/03/2015  PCP: [name], MD  Admit date: 08/03/2015 Transfer date: 08/28/2015  Transfer to Inpatient Psychiatry unit for ongoing psychosis.  Repeat folate and vitamin b12 levels in 1 month.  Discharge Diagnoses:  Principal Problem:   Psychosis Active Problems:   Alcohol dependence   Alcohol withdrawal   Protein-calorie malnutrition, severe   COPD exacerbation   Hyponatremia   Hyperglycemia   Hypokalemia   Acute respiratory failure with hypoxia   Severe protein-calorie malnutrition   Hepatitis C antibody test positive   Elevated LFTs   Hyperkalemia   Hypomagnesemia   Protein-calorie malnutrition   Discharge Condition: stable, improved  Diet recommendation: regular  Wt Readings from Last 3 Encounters:  08/25/15 40.869 kg (90 lb 1.6 oz)  05/07/15 45.36 kg (100 lb)  03/19/15 48.7 kg (107 lb 5.8 oz)    History of present illness and brief summary:  54 year old female with a history of alcohol abuse (1 pint vodka daily), tobacco abuse, COPD (non-oxygen dependent), hepatitis C who presented with shortness of breath for several days prior to admission. She was encephalopathic/obtunded and unable to provide history in the emergency department. She was found to be hypoxic and placed on nonrebreather. She continued to have desaturation into the 70s despite albuterol nebulizers.  Alcohol level was 436 at the time of admission. It was thought that her respiratory failure was likely due to respiratory depression from alcohol intoxication. On the morning of 08/07/2015, the patient became increasingly agitated requiring additional doses of Ativan. On 08/08/2015, critical care medicine was consulted due to continued agitation and hallucinations. Fortunately, the patient improved without the use of Precedex. She was transferred back to the medical floor on 08/10/2015. Since then, the patient has been lucid,  alert and oriented, but has bouts of impulsiveness.  She has also had bouts of agitation and aggression towards staff members. Patient has been IVC. Patient has been denied by [facility] for chemical dependency rehabilitation. Patient does have a Recruitment consultant. Despite increasing her Seroquel dose, she has continued to require sitter and restraints.  Patient has no capacity and is unsafe to be home by herself. Patient's boyfriend due to his primary job responsibilities will be able to care for her on a daily basis.  Transferring to inpatient psychiatric unit for ongoing management.    Hospital Course:   Acute respiratory failure with hypoxia due to hypoventilation and respiratory depression from alcoholic intoxication, resolved with improvement in her alcohol levels.    Alcohol dependence/ Alcohol withdrawal/ Delirium.  She has remained aggressive and altered.  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  Although her ammonia level was mildly elevated, she did not have slowing or asterixis to suggest hepatic encephalopathy.  PRP and HIV were negative.  Psychiatry, Dr. Elsie Saas was consulted and recommended long term psychiatric hospitalization "secondary to deterioration in her mental health status, crisis stabilization, and safety monitoring and appropriate medication management and unable to care for herself."    Tobacco abuse, given Nicoderm patch.  Patient precontemplative.    Hyperglycemia due to stress and alcohol intoxication, 08/04/2015 Hemoglobin A1c= 5.6.  Resolved.  Protein-calorie malnutrition, severe, patient has been seen by nutrition. Started supplementation with boost breeze.   Elevated LFTs, likely due to alcohol abuse and chronic hepatitis C.  AST only minimally elevated and stable.    Macrocytic anemia, iron, B12, and folate levels were wnl.  Likely due to continued alcohol use.    Infectious/Chronic Hep  C without Coma.   -HIV negative -RPR negative -Acute hepatitis panel  positive hepatitis C virus antibody -HCV Quant viral load = 285,000 -Outpatient follow-up with gastroenterology once her mental status has improved.    COPD, stable on room air.  Continue Spiriva and Symbicort.  Consultants:  Psychiatry: Dr. Elsie Saas 08/13/2015  PCCM : Dr. Vassie Loll 08/08/2015  Procedures:  Acute abdominal series 08/03/2015  Antibiotics:  None  Discharge Exam: Filed Vitals:   08/28/15 0449  BP: 95/41  Pulse: 87  Temp:   Resp: 50   Filed Vitals:   08/27/15 0944 08/27/15 1923 08/28/15 0449 08/28/15 1040  BP: 135/83 112/91 95/41   Pulse: 94 88 87   Temp: 98.4 F (36.9 C) 97.5 F (36.4 C)    TempSrc: Oral Axillary    Resp: 65 96 50   Height:      Weight:      SpO2: 100% 97% 96% 96%    General: cachectic female, NAD, pulling at her restraints, grasping the hand of the sitter and trying to bring it to her mouth where she kissed the hand repeatedly and then writhed in bed Cardiovascular: RRR, mrg Respiratory: CTAB, no increased WOB ABD:  NABS, soft, ND/NT MSK:  No LEE, normal tone and bulk  Discharge Instructions      Discharge Instructions    Diet general    Complete by:  As directed      Increase activity slowly    Complete by:  As directed             Medication List    STOP taking these medications        albuterol-ipratropium 18-103 MCG/ACT inhaler  Commonly known as:  COMBIVENT     clindamycin 150 MG capsule  Commonly known as:  CLEOCIN     diphenhydrAMINE 25 MG tablet  Commonly known as:  BENADRYL     folic acid 1 MG tablet  Commonly known as:  FOLVITE     ibuprofen 200 MG tablet  Commonly known as:  ADVIL,MOTRIN     MAGNESIUM PO     POTASSIMIN PO     vitamin B-12 1000 MCG tablet  Commonly known as:  CYANOCOBALAMIN      TAKE these medications        albuterol 108 (90 BASE) MCG/ACT inhaler  Commonly known as:  PROVENTIL HFA;VENTOLIN HFA  Inhale 2 puffs into the lungs every 6 (six) hours as needed for wheezing  or shortness of breath.     budesonide-formoterol 80-4.5 MCG/ACT inhaler  Commonly known as:  SYMBICORT  Inhale 2 puffs into the lungs 2 (two) times daily.     DULoxetine 30 MG capsule  Commonly known as:  CYMBALTA  Take 60 mg by mouth daily.     feeding supplement (ENSURE ENLIVE) Liqd  Take 237 mLs by mouth 2 (two) times daily between meals.     ICY HOT EXTRA STRENGTH 10-30 % Crea  Apply 1 application topically daily as needed (for knee pain).     LORazepam 2 MG tablet  Commonly known as:  ATIVAN  Take 2 tablets (4 mg total) by mouth every 6 (six) hours as needed for anxiety or sleep.     multivitamin with minerals Tabs tablet  Take 1 tablet by mouth daily.     pantoprazole 40 MG tablet  Commonly known as:  PROTONIX  Take 1 tablet (40 mg total) by mouth daily at 6 (six) AM.     PREPARATION H EX  Apply 1 application topically daily as needed (for hemorrhoids).     QUEtiapine 50 MG tablet  Commonly known as:  SEROQUEL  Take 1 tablet (50 mg total) by mouth 3 (three) times daily.     thiamine 100 MG tablet  Take 1 tablet (100 mg total) by mouth daily.     tiotropium 18 MCG inhalation capsule  Commonly known as:  SPIRIVA  Place 1 capsule (18 mcg total) into inhaler and inhale daily.       Follow-up Information    Follow up with NNODI, Alesia Richards, MD.   Specialty:  Family Medicine   Why:  As needed   Contact information:   [address] [place] [address] [phone]       Follow up with Rachael Fee, MD. Schedule an appointment as soon as possible for a visit in 1 month.   Specialty:  Gastroenterology   Contact information:   [address] [place] [address] [phone]        The results of significant diagnostics from this hospitalization (including imaging, microbiology, ancillary and laboratory) are listed below for reference.    Significant Diagnostic Studies: Dg Abd Acute W/chest  08/03/2015   CLINICAL DATA:  Shortness of breath.   COPD.  Rectal prolapse.  EXAM: DG ABDOMEN ACUTE W/ 1V CHEST  COMPARISON:  One-view chest x-ray 03/18/2015.  FINDINGS: The heart is mildly enlarged. Mild interstitial coarsening is slightly increased.  Gas and stool are present throughout the colon. The bowel gas pattern is otherwise normal. There is no free air or obstruction. The axial skeleton is within normal limits.  IMPRESSION: 1. Mild increased interstitial coarsening could represent mild edema. 2. Negative two view abdomen.   Electronically Signed   By: Marin Roberts M.D.   On: 08/03/2015 17:39    Microbiology: No results found for this or any previous visit (from the past 240 hour(s)).   Labs: Basic Metabolic Panel:  Recent Labs Lab 08/25/15 0628  NA 135  K 4.4  CL 102  CO2 23  GLUCOSE 83  BUN 15  CREATININE 0.98  CALCIUM 9.3   Liver Function Tests:  Recent Labs Lab 08/25/15 0628  AST 69*  ALT 49  ALKPHOS 72  BILITOT 0.6  PROT 6.9  ALBUMIN 3.3*   No results for input(s): LIPASE, AMYLASE in the last 168 hours. No results for input(s): AMMONIA in the last 168 hours. CBC:  Recent Labs Lab 08/25/15 0628  WBC 7.7  HGB 10.1*  HCT 31.8*  MCV 98.5  PLT 487*   Cardiac Enzymes: No results for input(s): CKTOTAL, CKMB, CKMBINDEX, TROPONINI in the last 168 hours. BNP: BNP (last 3 results)  Recent Labs  03/18/15 2109 08/03/15 1820  BNP 234.0* 55.1    ProBNP (last 3 results) No results for input(s): PROBNP in the last 8760 hours.  CBG: No results for input(s): GLUCAP in the last 168 hours.  Time coordinating discharge: 35 minutes  Signed:  Harlen Danford  [facility] 08/28/2015, 11:49 AM

## 2015-08-28 NOTE — Clinical Social Work Note (Signed)
CSW received call from [facility] regarding bed available for patient on 08/28/2015. Requested documentation provided to admissions at [facility]. IVC paperwork updated and patient has been served with new IVC paperwork.  Patient discharging to [facility] on 08/28/2015. [place] [facility] transportation has been arranged. RN provided with report number for [facility].  Marcelline Deist, [place] - [phone] Clinical Social Work Department Orthopedics [phone]) and Surgical [phone])

## 2015-10-17 ENCOUNTER — Emergency Department (HOSPITAL_COMMUNITY)
Admission: EM | Admit: 2015-10-17 | Discharge: 2015-10-18 | Disposition: A | Discharge status: Home / Self Care | Payer: Self-pay | Attending: Emergency Medicine | Admitting: Emergency Medicine

## 2015-10-17 ENCOUNTER — Encounter (HOSPITAL_COMMUNITY): Payer: Self-pay

## 2015-10-17 DIAGNOSIS — K625 Hemorrhage of anus and rectum: Secondary | ICD-10-CM | POA: Insufficient documentation

## 2015-10-17 DIAGNOSIS — Z79899 Other long term (current) drug therapy: Secondary | ICD-10-CM | POA: Insufficient documentation

## 2015-10-17 DIAGNOSIS — Z720 Tobacco use: Secondary | ICD-10-CM | POA: Insufficient documentation

## 2015-10-17 DIAGNOSIS — M1990 Unspecified osteoarthritis, unspecified site: Secondary | ICD-10-CM | POA: Insufficient documentation

## 2015-10-17 DIAGNOSIS — Z8619 Personal history of other infectious and parasitic diseases: Secondary | ICD-10-CM | POA: Insufficient documentation

## 2015-10-17 DIAGNOSIS — K219 Gastro-esophageal reflux disease without esophagitis: Secondary | ICD-10-CM | POA: Insufficient documentation

## 2015-10-17 DIAGNOSIS — Z8614 Personal history of Methicillin resistant Staphylococcus aureus infection: Secondary | ICD-10-CM | POA: Insufficient documentation

## 2015-10-17 DIAGNOSIS — F419 Anxiety disorder, unspecified: Secondary | ICD-10-CM | POA: Insufficient documentation

## 2015-10-17 DIAGNOSIS — Z7951 Long term (current) use of inhaled steroids: Secondary | ICD-10-CM | POA: Insufficient documentation

## 2015-10-17 DIAGNOSIS — J449 Chronic obstructive pulmonary disease, unspecified: Secondary | ICD-10-CM | POA: Insufficient documentation

## 2015-10-17 DIAGNOSIS — G4700 Insomnia, unspecified: Secondary | ICD-10-CM | POA: Insufficient documentation

## 2015-10-17 DIAGNOSIS — F1010 Alcohol abuse, uncomplicated: Secondary | ICD-10-CM | POA: Insufficient documentation

## 2015-10-17 LAB — CBC WITH DIFFERENTIAL/PLATELET
Basophils Absolute: 0 10*3/uL (ref 0.0–0.1)
Basophils Relative: 1 %
EOS ABS: 0 10*3/uL (ref 0.0–0.7)
EOS PCT: 0 %
HCT: 31.6 % — ABNORMAL LOW (ref 36.0–46.0)
Hemoglobin: 10.3 g/dL — ABNORMAL LOW (ref 12.0–15.0)
LYMPHS ABS: 1.4 10*3/uL (ref 0.7–4.0)
LYMPHS PCT: 27 %
MCH: 29.9 pg (ref 26.0–34.0)
MCHC: 32.6 g/dL (ref 30.0–36.0)
MCV: 91.9 fL (ref 78.0–100.0)
MONO ABS: 0.5 10*3/uL (ref 0.1–1.0)
Monocytes Relative: 10 %
Neutro Abs: 3.2 10*3/uL (ref 1.7–7.7)
Neutrophils Relative %: 62 %
PLATELETS: 328 10*3/uL (ref 150–400)
RBC: 3.44 MIL/uL — AB (ref 3.87–5.11)
RDW: 19.6 % — AB (ref 11.5–15.5)
WBC: 5.2 10*3/uL (ref 4.0–10.5)

## 2015-10-17 LAB — COMPREHENSIVE METABOLIC PANEL
ALT: 56 U/L — ABNORMAL HIGH (ref 14–54)
ANION GAP: 12 (ref 5–15)
AST: 132 U/L — ABNORMAL HIGH (ref 15–41)
Albumin: 4.3 g/dL (ref 3.5–5.0)
Alkaline Phosphatase: 89 U/L (ref 38–126)
BUN: 17 mg/dL (ref 6–20)
CHLORIDE: 98 mmol/L — AB (ref 101–111)
CO2: 22 mmol/L (ref 22–32)
CREATININE: 0.68 mg/dL (ref 0.44–1.00)
Calcium: 8.5 mg/dL — ABNORMAL LOW (ref 8.9–10.3)
Glucose, Bld: 94 mg/dL (ref 65–99)
POTASSIUM: 4.3 mmol/L (ref 3.5–5.1)
SODIUM: 132 mmol/L — AB (ref 135–145)
Total Bilirubin: 0.5 mg/dL (ref 0.3–1.2)
Total Protein: 8.2 g/dL — ABNORMAL HIGH (ref 6.5–8.1)

## 2015-10-17 LAB — POC OCCULT BLOOD, ED: Fecal Occult Bld: NEGATIVE

## 2015-10-17 LAB — ETHANOL: ALCOHOL ETHYL (B): 254 mg/dL — AB (ref <5)

## 2015-10-17 MED ORDER — ONDANSETRON HCL 4 MG PO TABS: 4.0000 mg | ORAL_TABLET | Freq: Three times a day (TID) | ORAL | Status: DC | PRN

## 2015-10-17 MED ORDER — QUETIAPINE FUMARATE 50 MG PO TABS: 50.0000 mg | ORAL_TABLET | Freq: Three times a day (TID) | ORAL | Status: DC

## 2015-10-17 MED ORDER — ALUM & MAG HYDROXIDE-SIMETH 200-200-20 MG/5ML PO SUSP
30.0000 mL | ORAL | Status: DC | PRN
  Administered 2015-10-17: 30 mL via ORAL
  Filled 2015-10-17: qty 30

## 2015-10-17 MED ORDER — DULOXETINE HCL 30 MG PO CPEP
30.0000 mg | ORAL_CAPSULE | Freq: Two times a day (BID) | ORAL | Status: DC
  Administered 2015-10-17: 30 mg via ORAL
  Filled 2015-10-17 (×3): qty 1

## 2015-10-17 MED ORDER — IBUPROFEN 200 MG PO TABS: 600.0000 mg | ORAL_TABLET | Freq: Three times a day (TID) | ORAL | Status: DC | PRN

## 2015-10-17 MED ORDER — NICOTINE 21 MG/24HR TD PT24
21.0000 mg | MEDICATED_PATCH | Freq: Every day | TRANSDERMAL | Status: DC
  Administered 2015-10-17: 21 mg via TRANSDERMAL
  Filled 2015-10-17: qty 1

## 2015-10-17 MED ORDER — VITAMIN B-1 100 MG PO TABS
100.0000 mg | ORAL_TABLET | Freq: Every day | ORAL | Status: DC
  Administered 2015-10-17: 100 mg via ORAL
  Filled 2015-10-17: qty 1

## 2015-10-17 MED ORDER — BUDESONIDE-FORMOTEROL FUMARATE 80-4.5 MCG/ACT IN AERO
2.0000 | INHALATION_SPRAY | Freq: Two times a day (BID) | RESPIRATORY_TRACT | Status: DC
  Filled 2015-10-17 (×2): qty 6.9

## 2015-10-17 MED ORDER — ZOLPIDEM TARTRATE 5 MG PO TABS: 5.0000 mg | ORAL_TABLET | Freq: Every evening | ORAL | Status: DC | PRN

## 2015-10-17 MED ORDER — THIAMINE HCL 100 MG/ML IJ SOLN: 100.0000 mg | Freq: Every day | INTRAMUSCULAR | Status: DC

## 2015-10-17 MED ORDER — DULOXETINE HCL 60 MG PO CPEP
60.0000 mg | ORAL_CAPSULE | Freq: Every day | ORAL | Status: DC
  Filled 2015-10-17: qty 1

## 2015-10-17 MED ORDER — LORAZEPAM 1 MG PO TABS: 0.0000 mg | ORAL_TABLET | Freq: Two times a day (BID) | ORAL | Status: DC

## 2015-10-17 MED ORDER — ALBUTEROL SULFATE HFA 108 (90 BASE) MCG/ACT IN AERS
2.0000 | INHALATION_SPRAY | Freq: Four times a day (QID) | RESPIRATORY_TRACT | Status: DC | PRN
  Administered 2015-10-17: 2 via RESPIRATORY_TRACT
  Filled 2015-10-17: qty 6.7

## 2015-10-17 MED ORDER — LORAZEPAM 1 MG PO TABS
0.0000 mg | ORAL_TABLET | Freq: Four times a day (QID) | ORAL | Status: DC
  Administered 2015-10-17: 2 mg via ORAL
  Filled 2015-10-17: qty 2

## 2015-10-17 NOTE — Progress Notes (Signed)
When CM entered pt room she asked for pain medication and registration staff leaving her room had informed CM pt was asking for pain medication.  CM discussed with pt that CM would ask her ED RN about her pain medication CM inquired about pt pcp Pt states [facility] off [address] but can not recall the name.  CM discussed with pt that [facility] had detox guidelines and the ED RN would share them with her.  CM encouraged pt to be [facility] by [facility] staff after their offer of detox process.  Pt said " Oh Ok.  Can you get the nurse to give me pain medication?" ED PA/NP & ED RN updated CM spoke with [place], TTS contacted to discuss pt offered [facility] detox but came to ED and CM discussed with her that [facility] could not provide alcohol detox with encouragement to [facility].  [place] to provide pt with detox resources

## 2015-10-17 NOTE — Discharge Instructions (Signed)
Please follow up with [facility] for further help with alcohol detox.    Alcohol Use Disorder Alcohol use disorder is a mental disorder. It is not a one-time incident of heavy drinking. Alcohol use disorder is the excessive and uncontrollable use of alcohol over time that leads to problems with functioning in one or more areas of daily living. People with this disorder risk harming themselves and others when they drink to excess. Alcohol use disorder also can cause other mental disorders, such as mood and anxiety disorders, and serious physical problems. People with alcohol use disorder often misuse other drugs.  Alcohol use disorder is common and widespread. Some people with this disorder drink alcohol to cope with or escape from negative life events. Others drink to relieve chronic pain or symptoms of mental illness. People with a family history of alcohol use disorder are at higher risk of losing control and using alcohol to excess.  Drinking too much alcohol can cause injury, accidents, and health problems. One drink can be too much when you are:  Working.  Pregnant or breastfeeding.  Taking medicines. Ask your doctor.  Driving or planning to drive. SYMPTOMS  Signs and symptoms of alcohol use disorder may include the following:   Consumption ofalcohol inlarger amounts or over a longer period of time than intended.  Multiple unsuccessful attempts to cutdown or control alcohol use.   A great deal of time spent obtaining alcohol, using alcohol, or recovering from the effects of alcohol (hangover).  A strong desire or urge to use alcohol (cravings).   Continued use of alcohol despite problems at work, school, or home because of alcohol use.   Continued use of alcohol despite problems in relationships because of alcohol use.  Continued use of alcohol in situations when it is physically hazardous, such as driving a car.  Continued use of alcohol despite awareness of a physical or  psychological problem that is likely related to alcohol use. Physical problems related to alcohol use can involve the brain, heart, liver, stomach, and intestines. Psychological problems related to alcohol use include intoxication, depression, anxiety, psychosis, delirium, and dementia.   The need for increased amounts of alcohol to achieve the same desired effect, or a decreased effect from the consumption of the same amount of alcohol (tolerance).  Withdrawal symptoms upon reducing or stopping alcohol use, or alcohol use to reduce or avoid withdrawal symptoms. Withdrawal symptoms include:  Racing heart.  Hand tremor.  Difficulty sleeping.  Nausea.  Vomiting.  Hallucinations.  Restlessness.  Seizures. DIAGNOSIS Alcohol use disorder is diagnosed through an assessment by your health care provider. Your health care provider may start by asking three or four questions to screen for excessive or problematic alcohol use. To confirm a diagnosis of alcohol use disorder, at least two symptoms must be present within a 74-month period. The severity of alcohol use disorder depends on the number of symptoms:  Mild--two or three.  Moderate--four or five.  Severe--six or more. Your health care provider may perform a physical exam or use results from lab tests to see if you have physical problems resulting from alcohol use. Your health care provider may refer you to a mental health professional for evaluation. TREATMENT  Some people with alcohol use disorder are able to reduce their alcohol use to low-risk levels. Some people with alcohol use disorder need to quit drinking alcohol. When necessary, mental health professionals with specialized training in substance use treatment can help. Your health care provider can help you decide  how severe your alcohol use disorder is and what type of treatment you need. The following forms of treatment are available:   Detoxification. Detoxification involves  the use of prescription medicines to prevent alcohol withdrawal symptoms in the first week after quitting. This is important for people with a history of symptoms of withdrawal and for heavy drinkers who are likely to have withdrawal symptoms. Alcohol withdrawal can be dangerous and, in severe cases, cause death. Detoxification is usually provided in a hospital or in-patient substance use treatment facility.  Counseling or talk therapy. Talk therapy is provided by substance use treatment counselors. It addresses the reasons people use alcohol and ways to keep them from drinking again. The goals of talk therapy are to help people with alcohol use disorder find healthy activities and ways to cope with life stress, to identify and avoid triggers for alcohol use, and to handle cravings, which can cause relapse.  Medicines.Different medicines can help treat alcohol use disorder through the following actions:  Decrease alcohol cravings.  Decrease the positive reward response felt from alcohol use.  Produce an uncomfortable physical reaction when alcohol is used (aversion therapy).  Support groups. Support groups are run by people who have quit drinking. They provide emotional support, advice, and guidance. These forms of treatment are often combined. Some people with alcohol use disorder benefit from intensive combination treatment provided by specialized substance use treatment centers. Both inpatient and outpatient treatment programs are available.   This information is not intended to replace advice given to you by your health care provider. Make sure you discuss any questions you have with your health care provider.   Document Released: 01/14/2005 Document Revised: 12/28/2014 Document Reviewed: 03/16/2013 Elsevier Interactive Patient Education [employer]

## 2015-10-17 NOTE — ED Notes (Signed)
Bed: [facility] Expected date:  Expected time:  Means of arrival:  Comments: Room 10 
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
## 2015-10-17 NOTE — Progress Notes (Signed)
ED CM consulted by ED NP/PA  Pt wanting to detox She [facility] but feels unable to wait that long

## 2015-10-17 NOTE — ED Notes (Signed)
Below orders not completed by EW. 

## 2015-10-17 NOTE — ED Notes (Signed)
Per EMS- patient has been trying to decrease alcohol intake. Patient normally drinks a fifth of liquor daily, but drank a 6 pack beer today. Patient reported to EMS that she is scheduled for a detox facility in 6 days, but does not think she can make it until then. EMS also reports that the patient states she has blood on toilet paper when she wipes. Tremors present right arm.

## 2015-10-17 NOTE — ED Notes (Signed)
Pt reports she defecated on herself, was given scrub pants and assisted to clean herself up

## 2015-10-17 NOTE — ED Provider Notes (Signed)
CSN: [id number]     Arrival date & time 10/17/15  1510 History   First MD Initiated Contact with Patient 10/17/15 1513     No chief complaint on file.    (Consider location/radiation/quality/duration/timing/severity/associated sxs/prior Treatment) HPI  54 year old female with hx of alcohol abuse, hepC, anxiety, COPD here requesting for alcohol detox.  Pt report she normally drinks a fifth of vodka daily but trying to slow her drinking down.  Today she drank 6 pack of beers. She went to [facility] earlier requesting help with detox and report having a bed available next Wednesday.  However, pt felt she cannot hold out that long and decided to come to ER via EMS to seek help with detox.  She has been through detox 3 months ago and was staying at [facility].  She currently denies SI/HI/hallucination. No c/o n/v/d.  Report chronic abd pain from drinking.  She also report having dark stool for the past several months and also noticing BRBPR when wipes with mild rectal discomfort.  She Denies rec drug use.  She is a smoker.    Past Medical History  Diagnosis Date  . COPD (chronic obstructive pulmonary disease)   . Anxiety   . Shortness of breath   . Arthritis   . GERD (gastroesophageal reflux disease)   . Full dentures   . Insomnia   . Alcohol abuse   . Hepatitis C   . Alcohol abuse   . History of MRSA infection   . History of encephalopathy    Past Surgical History  Procedure Laterality Date  . Arm surgery    . Hand surgery  1990    both rt/lt carpal tunnels  . Shoulder surgery      right and left-age [age]  . Orif ulnar / radial shaft fracture  1990    right-got infection-had total 10 surgeies 1990  . Orif ulnar fracture Left 05/23/2014    Procedure: OPEN REDUCTION INTERNAL FIXATION (ORIF) LEFT ULNAR FRACTURE;  Surgeon: Harvie Junior, MD;  Location: [facility];  Service: Orthopedics;  Laterality: Left;  . I&d extremity Right 05/07/2015    Procedure: IRRIGATION AND DEBRIDEMENT   ELBOW ;  Surgeon: Dominica Severin, MD;  Location: [facility] OR;  Service: Orthopedics;  Laterality: Right;   Family History  Problem Relation Age of Onset  . Breast cancer Mother    Social History  Substance Use Topics  . Smoking status: Current Every Day Smoker -- 1.00 packs/day for 20 years    Types: Cigarettes  . Smokeless tobacco: Never Used  . Alcohol Use: Yes     Comment: 1/5th Vodka every day    OB History    No data available     Review of Systems  All other systems reviewed and are negative.     Allergies  Review of patient's allergies indicates no known allergies.  Home Medications   Prior to Admission medications   Medication Sig Start Date End Date Taking? Authorizing Provider  albuterol (PROVENTIL HFA;VENTOLIN HFA) 108 (90 BASE) MCG/ACT inhaler Inhale 2 puffs into the lungs every 6 (six) hours as needed for wheezing or shortness of breath. 08/28/15   Renae Fickle, MD  budesonide-formoterol [facility]) 80-4.5 MCG/ACT inhaler Inhale 2 puffs into the lungs 2 (two) times daily. 03/25/15   Shanker Levora Dredge, MD  DULoxetine (CYMBALTA) 30 MG capsule Take 60 mg by mouth daily.    Historical Provider, MD  feeding supplement, ENSURE ENLIVE, (ENSURE ENLIVE) LIQD Take 237 mLs  by mouth 2 (two) times daily between meals. 08/28/15   Renae Fickle, MD  LORazepam (ATIVAN) 2 MG tablet Take 2 tablets (4 mg total) by mouth every 6 (six) hours as needed for anxiety or sleep. 08/28/15   Renae Fickle, MD  Menthol-Methyl Salicylate (ICY HOT EXTRA STRENGTH) 10-30 % CREA Apply 1 application topically daily as needed (for knee pain).    Historical Provider, MD  Multiple Vitamin (MULTIVITAMIN WITH MINERALS) TABS tablet Take 1 tablet by mouth daily. 05/11/15   Costin Otelia Sergeant, MD  pantoprazole (PROTONIX) 40 MG tablet Take 1 tablet (40 mg total) by mouth daily at 6 (six) AM. 08/28/15   Renae Fickle, MD  QUEtiapine (SEROQUEL) 50 MG tablet Take 1 tablet (50 mg total) by mouth 3 (three) times daily.  08/28/15   Renae Fickle, MD  thiamine 100 MG tablet Take 1 tablet (100 mg total) by mouth daily. 05/11/15   Costin Otelia Sergeant, MD  tiotropium (SPIRIVA) 18 MCG inhalation capsule Place 1 capsule (18 mcg total) into inhaler and inhale daily. 03/25/15   Shanker Levora Dredge, MD  Witch Hazel (PREPARATION H EX) Apply 1 application topically daily as needed (for hemorrhoids).     Historical Provider, MD   There were no vitals taken for this visit. Physical Exam  Constitutional: She appears well-developed and well-nourished. No distress.  Chronically ill appearing Caucasian female, nontoxic.    HENT:  Head: Atraumatic.  Mouth is dry  Eyes: Conjunctivae are normal.  Neck: Neck supple.  Cardiovascular: Normal rate and regular rhythm.   Pulmonary/Chest: Effort normal and breath sounds normal.  Decreased lung sounds, no obvious wheezes, rales, or rhonchi  Abdominal: Soft. Bowel sounds are normal. She exhibits no distension. There is tenderness (mild diffused abd tenderness, no guarding or rebound tenderness).  Genitourinary:  Chaperone present during exam.  External hemorrhoid noted.  Normal color stool. No active bleeding.  No mass.  No obstruction.  Hemoccult negative.    Neurological: She is alert. She has normal strength. GCS eye subscore is 4. GCS verbal subscore is 5. GCS motor subscore is 6.  Skin: No rash noted.  Psychiatric: She has a normal mood and affect.  Nursing note and vitals reviewed.   ED Course  Procedures (including critical care time)  Pt with hx of alcohol abuse, here requesting for alcohol detox.  She reportedly consumed 6 pack of beer today.  No evidence of DT, no hx of alcohol related seizure.  Does have a tremor and concerns of withdrawal.  CIWA protocol initiated, work up initiated.  Will consult TTS once pt is deemed stable.    4:03 PM I discussed care with Selena Batten, our TTS coordinator who will see pt and determine needs.    5:45 PM Pt received resources for outpt follow up.  Pt is stable for discharge to f/u with [facility] for outpt detox.    7:27 PM Pt able to ambulate, clinically sober.  Resources provided.  Pt to f/u.    Labs Review Labs Reviewed  CBC WITH DIFFERENTIAL/PLATELET - Abnormal; Notable for the following:    RBC 3.44 (*)    Hemoglobin 10.3 (*)    HCT 31.6 (*)    RDW 19.6 (*)    All other components within normal limits  COMPREHENSIVE METABOLIC PANEL - Abnormal; Notable for the following:    Sodium 132 (*)    Chloride 98 (*)    Calcium 8.5 (*)    Total Protein 8.2 (*)    AST 132 (*)  ALT 56 (*)    All other components within normal limits  ETHANOL - Abnormal; Notable for the following:    Alcohol, Ethyl (B) 254 (*)    All other components within normal limits  URINE RAPID DRUG SCREEN, HOSP PERFORMED  POC OCCULT BLOOD, ED    Imaging Review No results found. I have personally reviewed and evaluated these images and lab results as part of my medical decision-making.   EKG Interpretation None      MDM   Final diagnoses:  Alcohol abuse    BP 159/93 mmHg  Pulse 80  Temp(Src) 98.2 F (36.8 C) (Oral)  Resp 20  SpO2 98%     Fayrene HelperBowie Khaleah Duer, PA-C 10/17/15 1753  Fayrene HelperBowie Janera Peugh, PA-C 10/18/15 0012  Marily MemosJason Mesner, MD 10/18/15 [phone]

## 2015-10-17 NOTE — Clinical Social Work Note (Signed)
Per PA Greta DoomBowie. TTS consult not needed.  Pt only requires resources that have been provided.  Elray Bubaegina Dalante Minus LCSW [facility]riage TTS [facility]

## 2015-10-17 NOTE — ED Notes (Signed)
Patient ate dinner

## 2015-10-17 NOTE — ED Notes (Signed)
Patient very drowsy. Will notify EDPa when patient is able to be discharged.

## 2015-10-17 NOTE — ED Notes (Signed)
Pt ambulated to bathroom, drowsy, unsteady gait, had to be assisted by staff member back to room

## 2015-10-18 NOTE — ED Notes (Signed)
Pt alert,oriented, and ambulatory . She reports she does not have a ride home until "tomorrow" pt has been up to use the phone several times.

## 2015-10-18 NOTE — ED Notes (Signed)
Pt resting comfortably

## 2015-10-18 NOTE — ED Notes (Signed)
Pt alert, oriented, and ambulatory upon DC. She is going to take a bus home when buses start running again. She reports that she has lost her wallet. No wallet found in pt belongings or in her bed. This RN showed her to the security desk to check to see if one had been turned in.

## 2015-10-18 NOTE — ED Notes (Signed)
Pt expressed desire to call someone to pick her up.  She was given use of a phone but tells me she is unable to reach anyone at this time

## 2015-10-26 ENCOUNTER — Encounter (HOSPITAL_COMMUNITY): Payer: Self-pay | Admitting: *Deleted

## 2015-10-26 ENCOUNTER — Emergency Department (HOSPITAL_COMMUNITY)
Admission: EM | Admit: 2015-10-26 | Discharge: 2015-10-27 | Disposition: A | Discharge status: Other Acute Inpatient Hospital | Payer: Federal, State, Local not specified - Other | Attending: Emergency Medicine | Admitting: Emergency Medicine

## 2015-10-26 ENCOUNTER — Emergency Department (HOSPITAL_COMMUNITY): Payer: Federal, State, Local not specified - Other

## 2015-10-26 DIAGNOSIS — F419 Anxiety disorder, unspecified: Secondary | ICD-10-CM | POA: Insufficient documentation

## 2015-10-26 DIAGNOSIS — Z8619 Personal history of other infectious and parasitic diseases: Secondary | ICD-10-CM | POA: Diagnosis not present

## 2015-10-26 DIAGNOSIS — J449 Chronic obstructive pulmonary disease, unspecified: Secondary | ICD-10-CM | POA: Insufficient documentation

## 2015-10-26 DIAGNOSIS — F10129 Alcohol abuse with intoxication, unspecified: Secondary | ICD-10-CM | POA: Diagnosis present

## 2015-10-26 DIAGNOSIS — Z79899 Other long term (current) drug therapy: Secondary | ICD-10-CM | POA: Diagnosis not present

## 2015-10-26 DIAGNOSIS — F10230 Alcohol dependence with withdrawal, uncomplicated: Secondary | ICD-10-CM | POA: Diagnosis not present

## 2015-10-26 DIAGNOSIS — Z7951 Long term (current) use of inhaled steroids: Secondary | ICD-10-CM | POA: Diagnosis not present

## 2015-10-26 DIAGNOSIS — Z8739 Personal history of other diseases of the musculoskeletal system and connective tissue: Secondary | ICD-10-CM | POA: Insufficient documentation

## 2015-10-26 DIAGNOSIS — K219 Gastro-esophageal reflux disease without esophagitis: Secondary | ICD-10-CM | POA: Insufficient documentation

## 2015-10-26 DIAGNOSIS — F1024 Alcohol dependence with alcohol-induced mood disorder: Secondary | ICD-10-CM | POA: Diagnosis not present

## 2015-10-26 DIAGNOSIS — G4700 Insomnia, unspecified: Secondary | ICD-10-CM | POA: Diagnosis not present

## 2015-10-26 DIAGNOSIS — F1094 Alcohol use, unspecified with alcohol-induced mood disorder: Secondary | ICD-10-CM | POA: Diagnosis not present

## 2015-10-26 DIAGNOSIS — Z720 Tobacco use: Secondary | ICD-10-CM | POA: Diagnosis not present

## 2015-10-26 DIAGNOSIS — Z8614 Personal history of Methicillin resistant Staphylococcus aureus infection: Secondary | ICD-10-CM | POA: Diagnosis not present

## 2015-10-26 DIAGNOSIS — R45851 Suicidal ideations: Secondary | ICD-10-CM | POA: Diagnosis not present

## 2015-10-26 LAB — RAPID URINE DRUG SCREEN, HOSP PERFORMED
AMPHETAMINES: NOT DETECTED
Barbiturates: NOT DETECTED
Benzodiazepines: NOT DETECTED
Cocaine: NOT DETECTED
Opiates: NOT DETECTED
Tetrahydrocannabinol: NOT DETECTED

## 2015-10-26 LAB — CBC WITH DIFFERENTIAL/PLATELET
BASOS PCT: 0 %
Basophils Absolute: 0 10*3/uL (ref 0.0–0.1)
EOS ABS: 0 10*3/uL (ref 0.0–0.7)
Eosinophils Relative: 0 %
HCT: 33.3 % — ABNORMAL LOW (ref 36.0–46.0)
HEMOGLOBIN: 10.5 g/dL — AB (ref 12.0–15.0)
Lymphocytes Relative: 14 %
Lymphs Abs: 1.5 10*3/uL (ref 0.7–4.0)
MCH: 30.4 pg (ref 26.0–34.0)
MCHC: 31.5 g/dL (ref 30.0–36.0)
MCV: 96.5 fL (ref 78.0–100.0)
Monocytes Absolute: 0.5 10*3/uL (ref 0.1–1.0)
Monocytes Relative: 5 %
NEUTROS PCT: 81 %
Neutro Abs: 8.7 10*3/uL — ABNORMAL HIGH (ref 1.7–7.7)
PLATELETS: 178 10*3/uL (ref 150–400)
RBC: 3.45 MIL/uL — AB (ref 3.87–5.11)
RDW: 20 % — ABNORMAL HIGH (ref 11.5–15.5)
WBC: 10.7 10*3/uL — AB (ref 4.0–10.5)

## 2015-10-26 LAB — COMPREHENSIVE METABOLIC PANEL
ALK PHOS: 92 U/L (ref 38–126)
ALT: 59 U/L — AB (ref 14–54)
AST: 130 U/L — ABNORMAL HIGH (ref 15–41)
Albumin: 4.6 g/dL (ref 3.5–5.0)
Anion gap: 26 — ABNORMAL HIGH (ref 5–15)
BILIRUBIN TOTAL: 1.1 mg/dL (ref 0.3–1.2)
BUN: 23 mg/dL — ABNORMAL HIGH (ref 6–20)
CALCIUM: 8.8 mg/dL — AB (ref 8.9–10.3)
CO2: 14 mmol/L — ABNORMAL LOW (ref 22–32)
CREATININE: 1.15 mg/dL — AB (ref 0.44–1.00)
Chloride: 99 mmol/L — ABNORMAL LOW (ref 101–111)
GFR, EST NON AFRICAN AMERICAN: 53 mL/min — AB (ref >60)
Glucose, Bld: 86 mg/dL (ref 65–99)
Potassium: 4.4 mmol/L (ref 3.5–5.1)
SODIUM: 139 mmol/L (ref 135–145)
TOTAL PROTEIN: 9 g/dL — AB (ref 6.5–8.1)

## 2015-10-26 LAB — BASIC METABOLIC PANEL
ANION GAP: 19 — AB (ref 5–15)
BUN: 19 mg/dL (ref 6–20)
CALCIUM: 8.2 mg/dL — AB (ref 8.9–10.3)
CO2: 18 mmol/L — ABNORMAL LOW (ref 22–32)
Chloride: 98 mmol/L — ABNORMAL LOW (ref 101–111)
Creatinine, Ser: 1.08 mg/dL — ABNORMAL HIGH (ref 0.44–1.00)
GFR calc Af Amer: >60 mL/min (ref >60)
GFR, EST NON AFRICAN AMERICAN: 57 mL/min — AB (ref >60)
GLUCOSE: 275 mg/dL — AB (ref 65–99)
Potassium: 4.5 mmol/L (ref 3.5–5.1)
Sodium: 135 mmol/L (ref 135–145)

## 2015-10-26 LAB — ACETAMINOPHEN LEVEL

## 2015-10-26 LAB — TROPONIN I

## 2015-10-26 LAB — SALICYLATE LEVEL

## 2015-10-26 LAB — ETHANOL: ALCOHOL ETHYL (B): 258 mg/dL — AB (ref <5)

## 2015-10-26 MED ORDER — SODIUM CHLORIDE 0.9 % IV BOLUS (SEPSIS)
1000.0000 mL | Freq: Once | INTRAVENOUS | Status: AC
Start: 2015-10-26 — End: 2015-10-26
  Administered 2015-10-26: 1000 mL via INTRAVENOUS

## 2015-10-26 MED ORDER — HYDROXYZINE HCL 25 MG PO TABS
25.0000 mg | ORAL_TABLET | Freq: Four times a day (QID) | ORAL | Status: DC | PRN
  Administered 2015-10-27: 25 mg via ORAL
  Filled 2015-10-26: qty 1

## 2015-10-26 MED ORDER — IBUPROFEN 200 MG PO TABS
600.0000 mg | ORAL_TABLET | Freq: Three times a day (TID) | ORAL | Status: DC | PRN
  Administered 2015-10-27: 600 mg via ORAL
  Filled 2015-10-26: qty 3

## 2015-10-26 MED ORDER — METHYLPREDNISOLONE SODIUM SUCC 125 MG IJ SOLR
125.0000 mg | Freq: Once | INTRAMUSCULAR | Status: AC
  Administered 2015-10-26: 125 mg via INTRAVENOUS
  Filled 2015-10-26: qty 2

## 2015-10-26 MED ORDER — ADULT MULTIVITAMIN W/MINERALS CH
1.0000 | ORAL_TABLET | Freq: Every day | ORAL | Status: DC
  Administered 2015-10-26 – 2015-10-27 (×2): 1 via ORAL
  Filled 2015-10-26 (×2): qty 1

## 2015-10-26 MED ORDER — THIAMINE HCL 100 MG/ML IJ SOLN
100.0000 mg | Freq: Once | INTRAMUSCULAR | Status: AC
  Administered 2015-10-26: 100 mg via INTRAMUSCULAR
  Filled 2015-10-26: qty 2

## 2015-10-26 MED ORDER — NICOTINE 21 MG/24HR TD PT24
21.0000 mg | MEDICATED_PATCH | Freq: Every day | TRANSDERMAL | Status: DC
  Administered 2015-10-26 – 2015-10-27 (×2): 21 mg via TRANSDERMAL
  Filled 2015-10-26 (×2): qty 1

## 2015-10-26 MED ORDER — ALBUTEROL (5 MG/ML) CONTINUOUS INHALATION SOLN
10.0000 mg/h | INHALATION_SOLUTION | Freq: Once | RESPIRATORY_TRACT | Status: AC
  Administered 2015-10-26: 10 mg/h via RESPIRATORY_TRACT
  Filled 2015-10-26: qty 20

## 2015-10-26 MED ORDER — VITAMIN B-1 100 MG PO TABS
100.0000 mg | ORAL_TABLET | Freq: Every day | ORAL | Status: DC
  Administered 2015-10-27: 100 mg via ORAL
  Filled 2015-10-26: qty 1

## 2015-10-26 MED ORDER — LOPERAMIDE HCL 2 MG PO CAPS: 2.0000 mg | ORAL_CAPSULE | ORAL | Status: DC | PRN

## 2015-10-26 MED ORDER — IPRATROPIUM BROMIDE 0.02 % IN SOLN
0.5000 mg | Freq: Once | RESPIRATORY_TRACT | Status: AC
  Administered 2015-10-26: 0.5 mg via RESPIRATORY_TRACT
  Filled 2015-10-26: qty 2.5

## 2015-10-26 MED ORDER — LORAZEPAM 1 MG PO TABS
0.0000 mg | ORAL_TABLET | Freq: Four times a day (QID) | ORAL | Status: DC
  Administered 2015-10-26 – 2015-10-27 (×2): 1 mg via ORAL
  Administered 2015-10-27: 2 mg via ORAL
  Filled 2015-10-26: qty 1
  Filled 2015-10-26: qty 2
  Filled 2015-10-26: qty 1

## 2015-10-26 MED ORDER — LORAZEPAM 2 MG/ML IJ SOLN
1.0000 mg | Freq: Once | INTRAMUSCULAR | Status: AC
  Administered 2015-10-26: 1 mg via INTRAVENOUS
  Filled 2015-10-26: qty 1

## 2015-10-26 MED ORDER — LORAZEPAM 1 MG PO TABS
1.0000 mg | ORAL_TABLET | Freq: Four times a day (QID) | ORAL | Status: DC | PRN
  Administered 2015-10-26: 1 mg via ORAL
  Filled 2015-10-26: qty 1

## 2015-10-26 MED ORDER — LORAZEPAM 1 MG PO TABS: 0.0000 mg | ORAL_TABLET | Freq: Two times a day (BID) | ORAL | Status: DC

## 2015-10-26 MED ORDER — ACETAMINOPHEN 325 MG PO TABS: 650.0000 mg | ORAL_TABLET | ORAL | Status: DC | PRN

## 2015-10-26 MED ORDER — ONDANSETRON HCL 4 MG PO TABS: 4.0000 mg | ORAL_TABLET | Freq: Three times a day (TID) | ORAL | Status: DC | PRN

## 2015-10-26 MED ORDER — ONDANSETRON 4 MG PO TBDP
4.0000 mg | ORAL_TABLET | Freq: Four times a day (QID) | ORAL | Status: DC | PRN
  Administered 2015-10-26: 4 mg via ORAL
  Filled 2015-10-26: qty 1

## 2015-10-26 MED ORDER — ALUM & MAG HYDROXIDE-SIMETH 200-200-20 MG/5ML PO SUSP: 30.0000 mL | ORAL | Status: DC | PRN

## 2015-10-26 NOTE — BH Assessment (Signed)
Consulted with Nanine MeansJamison Lord, DNP who recommends inpatient once patient has been medically cleared.   Davina PokeJoVea Sharlotte Baka, LCSW Therapeutic Triage Specialist [facility] 10/26/2015 5:50 PM

## 2015-10-26 NOTE — ED Notes (Signed)
Requested sitter for Room 1

## 2015-10-26 NOTE — ED Notes (Signed)
Pt has one silver color ring, one breaded braclet, one silver in color flip cell phone, pink shirt and pink paints all placed in personal belongings bag and placed at  The 1-8 nursing desk.

## 2015-10-26 NOTE — ED Provider Notes (Signed)
Patient is more lucid and alert. She continues to complain of shakiness and agitation. IV Ativan. Transfer to psych.  Donnetta HutchingBrian Marvene Strohm, MD 10/26/15 [phone]

## 2015-10-26 NOTE — ED Notes (Addendum)
Patient has a flip cell phone, 2 rings, 1 shirt, 1 pair of capris, and one bracelets.

## 2015-10-26 NOTE — ED Provider Notes (Signed)
CSN: [id number]     Arrival date & time 10/26/15  1052 History   First MD Initiated Contact with Patient 10/26/15 1056     Chief Complaint  Patient presents with  . Alcohol Intoxication    Requesting detox     (Consider location/radiation/quality/duration/timing/severity/associated sxs/prior Treatment) HPI  The patient is a 54 year old female who is a chronic alcoholic, she also has COPD, hepatitis C, she states that she has not slept in several days, she has been drinking heavily including 4 pints of liquor a day. She reports that her symptoms are gradually worsening, she feels weak, she feels suicidal and she does have a history of cutting on her arm in a suicide attempt. 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 She feels that she cannot cope a more. Her last drink was 6 hours prior to arrival according to her report. She comes by paramedic transport with albuterol nebulized care due to her shortness of breath and wheezing. She does report chronic shortness of breath. Symptoms are persistent, gradually worsening, they're now severe. She does not have a specific plan for suicidal plan  Past Medical History  Diagnosis Date  . COPD (chronic obstructive pulmonary disease) (HCC)   . Anxiety   . Shortness of breath   . Arthritis   . GERD (gastroesophageal reflux disease)   . Full dentures   . Insomnia   . Alcohol abuse   . Hepatitis C   . Alcohol abuse   . History of MRSA infection   . History of encephalopathy    Past Surgical History  Procedure Laterality Date  . Arm surgery    . Hand surgery  1990    both rt/lt carpal tunnels  . Shoulder surgery      right and left-age [age]  . Orif ulnar / radial shaft fracture  1990    right-got infection-had total 10 surgeies 1990  . Orif ulnar fracture Left 05/23/2014    Procedure: OPEN REDUCTION INTERNAL  FIXATION (ORIF) LEFT ULNAR FRACTURE;  Surgeon: Harvie Junior, MD;  Location: [facility];  Service: Orthopedics;  Laterality: Left;  . I&d extremity Right 05/07/2015    Procedure: IRRIGATION AND DEBRIDEMENT  ELBOW ;  Surgeon: Dominica Severin, MD;  Location: [facility];  Service: Orthopedics;  Laterality: Right;   Family History  Problem Relation Age of Onset  . Breast cancer Mother    Social History  Substance Use Topics  . Smoking status: Current Every Day Smoker -- 1.00 packs/day for 20 years    Types: Cigarettes  . Smokeless tobacco: Never Used  . Alcohol Use: Yes     Comment: 1/5th Vodka every day    OB History    No data available     Review of Systems  All other systems reviewed and are negative.     Allergies  Review of patient's allergies indicates no known allergies.  Home Medications   Prior to Admission medications   Medication Sig Start Date End Date Taking? Authorizing Provider  albuterol (PROVENTIL HFA;VENTOLIN HFA) 108 (90 BASE) MCG/ACT inhaler Inhale 2 puffs into the lungs every 6 (six) hours as needed for wheezing or shortness of breath. 08/28/15   Renae Fickle, MD  budesonide-formoterol [facility]) 80-4.5 MCG/ACT inhaler Inhale 2 puffs into the lungs 2 (two) times daily. 03/25/15   Shanker Levora Dredge,  MD  diphenhydrAMINE (BENADRYL) 25 MG tablet Take 50 mg by mouth daily as needed for allergies.    Historical Provider, MD  DULoxetine (CYMBALTA) 30 MG capsule Take 30 mg by mouth 2 (two) times daily.     Historical Provider, MD  feeding supplement, ENSURE ENLIVE, (ENSURE ENLIVE) LIQD Take 237 mLs by mouth 2 (two) times daily between meals. Patient not taking: Reported on 10/17/2015 08/28/15   Renae Fickle, MD  haloperidol (HALDOL) 1 MG tablet Take 1 mg by mouth 2 (two) times daily. 09/22/15   Historical Provider, MD  LORazepam (ATIVAN) 2 MG tablet Take 2 tablets (4 mg total) by mouth every 6 (six) hours as needed for anxiety or sleep. Patient not taking:  Reported on 10/17/2015 08/28/15   Renae Fickle, MD  Menthol-Methyl Salicylate (ICY HOT EXTRA STRENGTH) 10-30 % CREA Apply 1 application topically daily as needed (for knee pain).    Historical Provider, MD  Multiple Vitamin (MULTIVITAMIN WITH MINERALS) TABS tablet Take 1 tablet by mouth daily. 05/11/15   Costin Otelia Sergeant, MD  pantoprazole (PROTONIX) 40 MG tablet Take 1 tablet (40 mg total) by mouth daily at 6 (six) AM. 08/28/15   Renae Fickle, MD  thiamine 100 MG tablet Take 1 tablet (100 mg total) by mouth daily. 05/11/15   Costin Otelia Sergeant, MD  tiotropium (SPIRIVA) 18 MCG inhalation capsule Place 1 capsule (18 mcg total) into inhaler and inhale daily. 03/25/15   Shanker Levora Dredge, MD  traZODone (DESYREL) 50 MG tablet Take 50 mg by mouth at bedtime. 09/22/15   Historical Provider, MD   BP 127/82 mmHg  Pulse 93  Resp 18  SpO2 92% Physical Exam  Constitutional: She appears well-developed and well-nourished. No distress.  HENT:  Head: Normocephalic and atraumatic.  Mouth/Throat: Oropharynx is clear and moist. No oropharyngeal exudate.  Eyes: Conjunctivae and EOM are normal. Pupils are equal, round, and reactive to light. Right eye exhibits no discharge. Left eye exhibits no discharge. No scleral icterus.  Neck: Normal range of motion. Neck supple. No JVD present. No thyromegaly present.  Cardiovascular: Normal rate, regular rhythm, normal heart sounds and intact distal pulses.  Exam reveals no gallop and no friction rub.   No murmur heard. Pulmonary/Chest: No respiratory distress. She has no wheezes. She has no rales.  Mild tachypnea, decreased lung sounds in all lung fields, expiratory wheezing, speaks in full sentences, no accessory muscle use  Abdominal: Soft. Bowel sounds are normal. She exhibits no distension and no mass. There is no tenderness.  Musculoskeletal: Normal range of motion. She exhibits no edema or tenderness.  Lymphadenopathy:    She has no cervical adenopathy.  Neurological:  She is alert. Coordination normal.  The patient does not have any tremor, there is no asterixis, she follows commands without difficulty, she is not slurring her speech, she does smell of alcohol.  Skin: Skin is warm and dry. No rash noted. No erythema.  Psychiatric:  Tearful, depressed, no hallucinations  Nursing note and vitals reviewed.   ED Course  Procedures (including critical care time) Labs Review Labs Reviewed  COMPREHENSIVE METABOLIC PANEL - Abnormal; Notable for the following:    Chloride 99 (*)    CO2 14 (*)    BUN 23 (*)    Creatinine, Ser 1.15 (*)    Calcium 8.8 (*)    Total Protein 9.0 (*)    AST 130 (*)    ALT 59 (*)    GFR calc non Af Amer 53 (*)  Anion gap 26 (*)    All other components within normal limits  CBC WITH DIFFERENTIAL/PLATELET - Abnormal; Notable for the following:    WBC 10.7 (*)    RBC 3.45 (*)    Hemoglobin 10.5 (*)    HCT 33.3 (*)    RDW 20.0 (*)    Neutro Abs 8.7 (*)    All other components within normal limits  ETHANOL - Abnormal; Notable for the following:    Alcohol, Ethyl (B) 258 (*)    All other components within normal limits  ACETAMINOPHEN LEVEL - Abnormal; Notable for the following:    Acetaminophen (Tylenol), Serum <10 (*)    All other components within normal limits  TROPONIN I  URINE RAPID DRUG SCREEN, HOSP PERFORMED  SALICYLATE LEVEL  BASIC METABOLIC PANEL    Imaging Review Dg Chest Port 1 View  10/26/2015  CLINICAL DATA:  54 year old female undergoing medical clearance. History includes COPD and active smoker EXAM: PORTABLE CHEST 1 VIEW COMPARISON:  Prior chest x-ray 03/18/2015 FINDINGS: Cardiac and mediastinal contours are within normal limits. Trace atherosclerotic calcification present in the transverse aorta. The lungs are clear save for minimal central bronchitic change which is stable. No suspicious pulmonary mass or nodule. No acute osseous abnormality. 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  IMPRESSION: 1. No acute cardiopulmonary process. 2. Aortic atherosclerosis. 3. 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 Electronically Signed   By: Malachy MoanHeath  McCullough M.D.   On: 10/26/2015 11:43   I have personally reviewed and evaluated these images and lab results as part of my medical decision-making.   EKG Interpretation   Date/Time:  Saturday October 26 2015 11:21:27 EDT Ventricular Rate:  91 PR Interval:  154 QRS Duration: 105 QT Interval:  390 QTC Calculation: 480 R Axis:   78 Text Interpretation:  Sinus rhythm Low voltage, extremity and precordial  leads Borderline repolarization abnormality Baseline wander in lead(s) I  II aVR aVF Since last tracing Baseline wander NOW PRESENT Confirmed by  Hyacinth MeekerMILLER  MD, Evgenia Merriman ([id number]) on 10/26/2015 11:48:17 AM      MDM   Final diagnoses:  None    The patient does appear intoxicated, she does not appear to be in acute withdrawal or to be having delirium tremens, she does not have any seizure activity, she will be evaluated for suicidality by psychiatry, labs, chest x-ray, continue wheezing therapy.  Change of shift - care signed out to Dr. Adriana Simasook pending BMP and reevaluation prior to move to psych.  Eber HongBrian Oluwadarasimi Favor, MD 10/26/15 [phone]
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
## 2015-10-26 NOTE — ED Notes (Signed)
Bed: WA01 Expected date: 10/26/15 Expected time:  Means of arrival:  Comments: EMS detox
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
## 2015-10-26 NOTE — BH Assessment (Addendum)
Went to assess patient. Patient was very agitated and appeared to have difficulty breathing. She states that she did not want to answer questions due to having withdrawal symptoms. Patient had difficulty breathing and stated that she drinks 4 pints of alcohol per day.  Patient labs were not collected at time of assessment. Consulted with Nanine MeansJamison Lord, DNP who laid eyes on patient and recommends patient be seen once medically cleared due to difficulty breathing.. Requested TTS consult be taken out until the patient is medically cleared.  Requested from the nurse to remove TTS consult.

## 2015-10-27 ENCOUNTER — Encounter (HOSPITAL_COMMUNITY): Payer: Self-pay | Admitting: Behavioral Health

## 2015-10-27 ENCOUNTER — Inpatient Hospital Stay (HOSPITAL_COMMUNITY)
Admission: AD | Admit: 2015-10-27 | Discharge: 2015-11-05 | DRG: 897 | Disposition: A | Discharge status: Home / Self Care | Payer: Medicaid Other | Source: Intra-hospital | Attending: Psychiatry | Admitting: Psychiatry

## 2015-10-27 DIAGNOSIS — G4700 Insomnia, unspecified: Secondary | ICD-10-CM | POA: Diagnosis present

## 2015-10-27 DIAGNOSIS — F419 Anxiety disorder, unspecified: Secondary | ICD-10-CM | POA: Diagnosis present

## 2015-10-27 DIAGNOSIS — F332 Major depressive disorder, recurrent severe without psychotic features: Secondary | ICD-10-CM | POA: Diagnosis present

## 2015-10-27 DIAGNOSIS — M1990 Unspecified osteoarthritis, unspecified site: Secondary | ICD-10-CM | POA: Diagnosis present

## 2015-10-27 DIAGNOSIS — F1094 Alcohol use, unspecified with alcohol-induced mood disorder: Secondary | ICD-10-CM | POA: Diagnosis present

## 2015-10-27 DIAGNOSIS — F17210 Nicotine dependence, cigarettes, uncomplicated: Secondary | ICD-10-CM | POA: Diagnosis present

## 2015-10-27 DIAGNOSIS — Z803 Family history of malignant neoplasm of breast: Secondary | ICD-10-CM

## 2015-10-27 DIAGNOSIS — B182 Chronic viral hepatitis C: Secondary | ICD-10-CM | POA: Diagnosis present

## 2015-10-27 DIAGNOSIS — R45851 Suicidal ideations: Secondary | ICD-10-CM

## 2015-10-27 DIAGNOSIS — F10230 Alcohol dependence with withdrawal, uncomplicated: Secondary | ICD-10-CM | POA: Diagnosis present

## 2015-10-27 DIAGNOSIS — R112 Nausea with vomiting, unspecified: Secondary | ICD-10-CM | POA: Diagnosis not present

## 2015-10-27 DIAGNOSIS — K219 Gastro-esophageal reflux disease without esophagitis: Secondary | ICD-10-CM | POA: Diagnosis present

## 2015-10-27 DIAGNOSIS — J449 Chronic obstructive pulmonary disease, unspecified: Secondary | ICD-10-CM | POA: Diagnosis present

## 2015-10-27 DIAGNOSIS — F1024 Alcohol dependence with alcohol-induced mood disorder: Principal | ICD-10-CM | POA: Diagnosis present

## 2015-10-27 MED ORDER — VITAMIN B-1 100 MG PO TABS
100.0000 mg | ORAL_TABLET | Freq: Every day | ORAL | Status: DC
  Administered 2015-10-28 – 2015-11-05 (×9): 100 mg via ORAL
  Filled 2015-10-27 (×6): qty 1
  Filled 2015-10-27: qty 7
  Filled 2015-10-27 (×5): qty 1

## 2015-10-27 MED ORDER — GABAPENTIN 100 MG PO CAPS
100.0000 mg | ORAL_CAPSULE | Freq: Three times a day (TID) | ORAL | Status: DC
  Administered 2015-10-27: 100 mg via ORAL
  Filled 2015-10-27: qty 1

## 2015-10-27 MED ORDER — LORAZEPAM 1 MG PO TABS
1.0000 mg | ORAL_TABLET | Freq: Four times a day (QID) | ORAL | Status: AC
  Administered 2015-10-27 – 2015-10-28 (×4): 1 mg via ORAL
  Filled 2015-10-27 (×4): qty 1

## 2015-10-27 MED ORDER — LORAZEPAM 1 MG PO TABS
1.0000 mg | ORAL_TABLET | Freq: Three times a day (TID) | ORAL | Status: AC
  Administered 2015-10-28 – 2015-10-29 (×3): 1 mg via ORAL
  Filled 2015-10-27 (×3): qty 1

## 2015-10-27 MED ORDER — TRAZODONE HCL 50 MG PO TABS
50.0000 mg | ORAL_TABLET | Freq: Every evening | ORAL | Status: DC | PRN
  Administered 2015-10-27 – 2015-11-04 (×8): 50 mg via ORAL
  Filled 2015-10-27 (×2): qty 1
  Filled 2015-10-27: qty 7
  Filled 2015-10-27 (×5): qty 1

## 2015-10-27 MED ORDER — HYDROXYZINE HCL 25 MG PO TABS
25.0000 mg | ORAL_TABLET | Freq: Four times a day (QID) | ORAL | Status: AC | PRN
  Administered 2015-10-27 – 2015-10-29 (×3): 25 mg via ORAL
  Filled 2015-10-27 (×4): qty 1

## 2015-10-27 MED ORDER — ACETAMINOPHEN 325 MG PO TABS
650.0000 mg | ORAL_TABLET | Freq: Four times a day (QID) | ORAL | Status: DC | PRN
  Administered 2015-10-30 – 2015-11-05 (×7): 650 mg via ORAL
  Filled 2015-10-27 (×7): qty 2

## 2015-10-27 MED ORDER — LORAZEPAM 1 MG PO TABS
1.0000 mg | ORAL_TABLET | Freq: Four times a day (QID) | ORAL | Status: DC | PRN
  Administered 2015-10-27 – 2015-10-30 (×5): 1 mg via ORAL
  Filled 2015-10-27 (×5): qty 1

## 2015-10-27 MED ORDER — CITALOPRAM HYDROBROMIDE 10 MG PO TABS
10.0000 mg | ORAL_TABLET | Freq: Every day | ORAL | Status: DC
  Administered 2015-10-27: 10 mg via ORAL
  Filled 2015-10-27 (×2): qty 1

## 2015-10-27 MED ORDER — GABAPENTIN 100 MG PO CAPS
100.0000 mg | ORAL_CAPSULE | Freq: Three times a day (TID) | ORAL | Status: DC
  Administered 2015-10-27 – 2015-11-05 (×27): 100 mg via ORAL
  Filled 2015-10-27: qty 1
  Filled 2015-10-27: qty 21
  Filled 2015-10-27 (×18): qty 1
  Filled 2015-10-27: qty 21
  Filled 2015-10-27 (×13): qty 1
  Filled 2015-10-27: qty 21
  Filled 2015-10-27: qty 1

## 2015-10-27 MED ORDER — LORAZEPAM 1 MG PO TABS
1.0000 mg | ORAL_TABLET | Freq: Every day | ORAL | Status: AC
  Administered 2015-10-31: 1 mg via ORAL
  Filled 2015-10-27: qty 1

## 2015-10-27 MED ORDER — ONDANSETRON 4 MG PO TBDP
4.0000 mg | ORAL_TABLET | Freq: Four times a day (QID) | ORAL | Status: AC | PRN
  Administered 2015-10-28 – 2015-10-29 (×3): 4 mg via ORAL
  Filled 2015-10-27 (×3): qty 1

## 2015-10-27 MED ORDER — ENSURE ENLIVE PO LIQD
237.0000 mL | Freq: Two times a day (BID) | ORAL | Status: DC
  Administered 2015-10-27 – 2015-11-05 (×15): 237 mL via ORAL

## 2015-10-27 MED ORDER — MAGNESIUM HYDROXIDE 400 MG/5ML PO SUSP: 30.0000 mL | Freq: Every day | ORAL | Status: DC | PRN

## 2015-10-27 MED ORDER — LOPERAMIDE HCL 2 MG PO CAPS
2.0000 mg | ORAL_CAPSULE | ORAL | Status: AC | PRN
  Filled 2015-10-27: qty 2

## 2015-10-27 MED ORDER — ADULT MULTIVITAMIN W/MINERALS CH
1.0000 | ORAL_TABLET | Freq: Every day | ORAL | Status: DC
  Administered 2015-10-28 – 2015-11-05 (×9): 1 via ORAL
  Filled 2015-10-27 (×12): qty 1

## 2015-10-27 MED ORDER — ALUM & MAG HYDROXIDE-SIMETH 200-200-20 MG/5ML PO SUSP
30.0000 mL | ORAL | Status: DC | PRN
  Administered 2015-10-28 – 2015-11-02 (×3): 30 mL via ORAL
  Filled 2015-10-27 (×3): qty 30

## 2015-10-27 MED ORDER — CITALOPRAM HYDROBROMIDE 10 MG PO TABS
10.0000 mg | ORAL_TABLET | Freq: Every day | ORAL | Status: DC
  Administered 2015-10-28: 10 mg via ORAL
  Filled 2015-10-27 (×4): qty 1

## 2015-10-27 MED ORDER — THIAMINE HCL 100 MG/ML IJ SOLN: 100.0000 mg | Freq: Once | INTRAMUSCULAR | Status: DC

## 2015-10-27 MED ORDER — LORAZEPAM 1 MG PO TABS
1.0000 mg | ORAL_TABLET | Freq: Two times a day (BID) | ORAL | Status: AC
  Administered 2015-10-29 – 2015-10-30 (×2): 1 mg via ORAL
  Filled 2015-10-27 (×2): qty 1

## 2015-10-27 NOTE — Consult Note (Signed)
[facility] Consult   Reason for Consult:  Alcohol intoxication, depression Referring Physician:  EDP Patient Identification: Brystal Kildow MRN:  [id number] Principal Diagnosis: Alcohol-induced mood disorder ([place]) Diagnosis:   Patient Active Problem List   Diagnosis Date Noted  . Alcohol dependence with uncomplicated withdrawal ([place]) [F10.230] 10/27/2015    Priority: High  . Alcohol-induced mood disorder ([place]) [F10.94] 10/27/2015    Priority: High  . Psychosis [F29] 08/21/2015  . Hypomagnesemia [E83.42]   . Protein-calorie malnutrition ([place]) [E46]   . Hepatitis C antibody test positive [R89.4]   . Elevated LFTs [R79.89]   . Hyperkalemia [E87.5]   . Severe protein-calorie malnutrition ([place]) [E43]   . Acute respiratory failure with hypoxia ([place]) [J96.01] 08/03/2015  . Septic olecranon bursitis of right elbow [M70.21, B96.89] 05/08/2015  . Septic bursitis of elbow [M71.129] 05/07/2015  . Thrombocytopenia ([place]) [D69.6] 05/07/2015  . Chronic hepatitis C without hepatic coma ([place]) [B18.2] 05/07/2015  . Hypokalemia [E87.6] 05/07/2015  . Abscess of bursa of elbow [M71.029] 05/07/2015  . COPD exacerbation ([place]) [J44.1] 03/18/2015  . Hyponatremia [E87.1] 03/18/2015  . Hyperglycemia [R73.9] 03/18/2015  . Alcohol dependence ([place]) [F10.20] 12/07/2014  . Alcohol withdrawal ([place]) [F10.239] 12/07/2014  . Protein-calorie malnutrition, severe ([place]) [E43] 12/07/2014  . Musculoskeletal chest pain [R07.89] 12/07/2014  . Tobacco abuse [Z72.0] 12/07/2014  . Carbuncle and furuncle [L02.92, L02.93] 12/07/2014  . Macrocytic anemia [D53.9] 12/07/2014  . Chest pain [R07.9] 11/30/2014  . Tachycardia [R00.0] 11/30/2014  . COPD (chronic obstructive pulmonary disease) ([place]) [J44.9] 11/30/2014  . Cellulitis and abscess [L03.90, L02.91] 11/30/2014  . Atypical chest pain [R07.89]   . Dehydration [E86.0]     Total Time spent with patient: 45 minutes  Subjective:   Deane Melick is a  54 y.o. female patient admitted with depression and severe alcohol dependence.  HPI:  On admission:  54 y.o. female who presents to [facility] voluntarily via EMS for detox. Patient presented with shortness of breath and wheezing upon arrival. Respiratory was consulted. Patient was later assessed and states that she would like detox. She states that she recently went to [facility] and was "turned down because I don't have insurance, but they are getting me in to see somebody." Patient states that she does not have any planned appointments for psychiatry or therapy at this time but "they're working on it." Patient states that she drinks four pints of Liquor per day and tried to stop yesterday. Patient states that she kept the alcohol beside her bed and slowly drank it to prevent symptoms of withdrawal and decided to come to the hospital for detox. Patient states that she has drank four pints of alcohol daily for the past year. Patient states that she used cocaine and heroin in the past but has not used in eight years. Patient states that she "substitutes the alcohol" for the other drugs that she used in the past. Patient denies SI currently but states that she really wants to stop drinking and is afraid that she will become suicidal if released and she has to detox without being monitored. Patient states that two of her brothers committed suicide four months ago and she thought about "stabbing myself in the jugular and bleeding out." Patient states that she was afraid and did not follow through with this plan. Patient states that ten years ago she attempted suicide by cutting her arm. Patient denies other attempts/gestures. Patient denies HI and history of being violent towards others. Patient states that she has 4 pending charges  for DUI's and her court dates start on November 05, 2015. Patient states that she is also on probation for "stealing from [facility]." Patient denies access to firearms or weapons. Patient denies AVH  but states that sometimes she hears her brothers' voices telling her to "do better." Patient does not appear to be responding to internal stimuli. Patient BAL 258 and UDS clear at this time,   Patient is alert and oriented x4. Patient talks loudly and appears angry. Patient continues to request medicine to stop the withdrawal symptoms. Patients sitter was informed. Patient is currently in her bed under a blanket with oxygen and IV. Patient states that oxygen makes her thirsty and requested more to drink. Patients sitter informed of patients request. Patient appeared partially impaired due to BAL of 258 upon arrival and being tangential at times. Patient made poor eye contact and her hands were shaking during the assessment. Patient states that she was in "[facility]" for two months earlier this year and states that she is unable to remember other inpatient treatments at this time. Patient states that she generally sleeps about 2 hours per night but has not been asleep or eaten in 4 days. Patient states that she is hungry at this time.   Today:  Patient continues to feel depressed with tremors from alcohol withdrawal.  Needs inpatient alcohol detox and depression treatment.  Past Psychiatric History: Alcohol dependence, depression, anxiety  Risk to Self: Suicidal Ideation: No Suicidal Intent: No Is patient at risk for suicide?: No Suicidal Plan?: No Access to Means: Yes Specify Access to Suicidal Means: 'cut jugular vein - 4 months ago What has been your use of drugs/alcohol within the last 12 months?: Alcohol How many times?: 1 (10 years ago) Other Self Harm Risks: Denies Triggers for Past Attempts: Other (Comment) (brothers passed away) Intentional Self Injurious Behavior: None Risk to Others: Homicidal Ideation: No Thoughts of Harm to Others: No Current Homicidal Intent: No Current Homicidal Plan: No Access to Homicidal Means: No Identified Victim: None reported History of harm to others?:  No Assessment of Violence: None Noted Violent Behavior Description: Denies Does patient have access to weapons?: No Criminal Charges Pending?: Yes Describe Pending Criminal Charges: DUI Does patient have a court date: Yes Court Date: 11/08/15 (4 DUIs) Prior Inpatient Therapy: Prior Inpatient Therapy: Yes Prior Therapy Dates: 2016 (for one month) Prior Therapy Facilty/Provider(s): [facility] Reason for Treatment: Depression Prior Outpatient Therapy: Prior Outpatient Therapy: No Prior Therapy Dates: N/A Prior Therapy Facilty/Provider(s): N/A Reason for Treatment: N/A Does patient have an ACCT team?: No Does patient have Intensive In-House Services?  : No Does patient have [facility] services? : Yes Does patient have P4CC services?: No  Past Medical History:  Past Medical History  Diagnosis Date  . COPD (chronic obstructive pulmonary disease) ([place])   . Anxiety   . Shortness of breath   . Arthritis   . GERD (gastroesophageal reflux disease)   . Full dentures   . Insomnia   . Alcohol abuse   . Hepatitis C   . Alcohol abuse   . History of MRSA infection   . History of encephalopathy     Past Surgical History  Procedure Laterality Date  . Arm surgery    . Hand surgery  1990    both rt/lt carpal tunnels  . Shoulder surgery      right and left-age [age]  . Orif ulnar / radial shaft fracture  1990    right-got infection-had total 10 surgeies 1990  .  Orif ulnar fracture Left 05/23/2014    Procedure: OPEN REDUCTION INTERNAL FIXATION (ORIF) LEFT ULNAR FRACTURE;  Surgeon: Alta Corning, MD;  Location: [facility];  Service: Orthopedics;  Laterality: Left;  . I&d extremity Right 05/07/2015    Procedure: IRRIGATION AND DEBRIDEMENT  ELBOW ;  Surgeon: Roseanne Kaufman, MD;  Location: [facility];  Service: Orthopedics;  Laterality: Right;   Family History:  Family History  Problem Relation Age of Onset  . Breast cancer Mother    Family Psychiatric  History: None Social History:   History  Alcohol Use  . Yes    Comment: 1/5th Vodka every day      History  Drug Use No    Comment: Hx: herion, cocaine 5 yrs ago    Social History   Social History  . Marital Status: Legally Separated    Spouse Name: N/A  . Number of Children: N/A  . Years of Education: N/A   Social History Main Topics  . Smoking status: Current Every Day Smoker -- 1.00 packs/day for 20 years    Types: Cigarettes  . Smokeless tobacco: Never Used  . Alcohol Use: Yes     Comment: 1/5th Vodka every day   . Drug Use: No     Comment: Hx: herion, cocaine 5 yrs ago  . Sexual Activity: Yes    Birth Control/ Protection: Post-menopausal   Other Topics Concern  . Not on file   Social History Narrative   Additional Social History:    Pain Medications: See PTA Prescriptions: See PTA Over the Counter: See PTA History of alcohol / drug use?: Yes Name of Substance 1: Alcohol 1 - Age of First Use: 48 1 - Amount (size/oz): 4 pints  1 - Frequency: daily 1 - Duration: ongoing 1 - Last Use / Amount: 5AM 4 pints throughout the night                   Allergies:  No Known Allergies  Labs:  Results for orders placed or performed during the hospital encounter of 10/26/15 (from the past 48 hour(s))  Urine rapid drug screen (hosp performed)     Status: None   Collection Time: 10/26/15 11:45 AM  Result Value Ref Range   Opiates NONE DETECTED NONE DETECTED   Cocaine NONE DETECTED NONE DETECTED   Benzodiazepines NONE DETECTED NONE DETECTED   Amphetamines NONE DETECTED NONE DETECTED   Tetrahydrocannabinol NONE DETECTED NONE DETECTED   Barbiturates NONE DETECTED NONE DETECTED    Comment:        DRUG SCREEN FOR MEDICAL PURPOSES ONLY.  IF CONFIRMATION IS NEEDED FOR ANY PURPOSE, NOTIFY LAB WITHIN 5 DAYS.        LOWEST DETECTABLE LIMITS FOR URINE DRUG SCREEN Drug Class       Cutoff (ng/mL) Amphetamine      1000 Barbiturate      200 Benzodiazepine   387 Tricyclics       564 Opiates           300 Cocaine          300 THC              50   Comprehensive metabolic panel     Status: Abnormal   Collection Time: 10/26/15 12:05 PM  Result Value Ref Range   Sodium 139 135 - 145 mmol/L   Potassium 4.4 3.5 - 5.1 mmol/L   Chloride 99 (L) 101 - 111 mmol/L   CO2 14 (L)  22 - 32 mmol/L   Glucose, Bld 86 65 - 99 mg/dL   BUN 23 (H) 6 - 20 mg/dL   Creatinine, Ser 1.15 (H) 0.44 - 1.00 mg/dL   Calcium 8.8 (L) 8.9 - 10.3 mg/dL   Total Protein 9.0 (H) 6.5 - 8.1 g/dL   Albumin 4.6 3.5 - 5.0 g/dL   AST 130 (H) 15 - 41 U/L   ALT 59 (H) 14 - 54 U/L   Alkaline Phosphatase 92 38 - 126 U/L   Total Bilirubin 1.1 0.3 - 1.2 mg/dL   GFR calc non Af Amer 53 (L) >60 mL/min   GFR calc Af Amer >60 >60 mL/min    Comment: (NOTE) The eGFR has been calculated using the CKD EPI equation. This calculation has not been validated in all clinical situations. eGFR's persistently <60 mL/min signify possible Chronic Kidney Disease.    Anion gap 26 (H) 5 - 15  CBC WITH DIFFERENTIAL     Status: Abnormal   Collection Time: 10/26/15 12:05 PM  Result Value Ref Range   WBC 10.7 (H) 4.0 - 10.5 K/uL   RBC 3.45 (L) 3.87 - 5.11 MIL/uL   Hemoglobin 10.5 (L) 12.0 - 15.0 g/dL   HCT 33.3 (L) 36.0 - 46.0 %   MCV 96.5 78.0 - 100.0 fL   MCH 30.4 26.0 - 34.0 pg   MCHC 31.5 30.0 - 36.0 g/dL   RDW 20.0 (H) 11.5 - 15.5 %   Platelets 178 150 - 400 K/uL   Neutrophils Relative % 81 %   Neutro Abs 8.7 (H) 1.7 - 7.7 K/uL   Lymphocytes Relative 14 %   Lymphs Abs 1.5 0.7 - 4.0 K/uL   Monocytes Relative 5 %   Monocytes Absolute 0.5 0.1 - 1.0 K/uL   Eosinophils Relative 0 %   Eosinophils Absolute 0.0 0.0 - 0.7 K/uL   Basophils Relative 0 %   Basophils Absolute 0.0 0.0 - 0.1 K/uL  Troponin I     Status: None   Collection Time: 10/26/15 12:05 PM  Result Value Ref Range   Troponin I <0.03 <0.031 ng/mL    Comment:        NO INDICATION OF MYOCARDIAL INJURY.   Ethanol     Status: Abnormal   Collection Time: 10/26/15 12:05  PM  Result Value Ref Range   Alcohol, Ethyl (B) 258 (H) <5 mg/dL    Comment:        LOWEST DETECTABLE LIMIT FOR SERUM ALCOHOL IS 5 mg/dL FOR MEDICAL PURPOSES ONLY   Salicylate level     Status: None   Collection Time: 10/26/15 12:05 PM  Result Value Ref Range   Salicylate Lvl <3.5 2.8 - 30.0 mg/dL  Acetaminophen level     Status: Abnormal   Collection Time: 10/26/15 12:05 PM  Result Value Ref Range   Acetaminophen (Tylenol), Serum <10 (L) 10 - 30 ug/mL    Comment:        THERAPEUTIC CONCENTRATIONS VARY SIGNIFICANTLY. A RANGE OF 10-30 ug/mL MAY BE AN EFFECTIVE CONCENTRATION FOR MANY PATIENTS. HOWEVER, SOME ARE BEST TREATED AT CONCENTRATIONS OUTSIDE THIS RANGE. ACETAMINOPHEN CONCENTRATIONS >150 ug/mL AT 4 HOURS AFTER INGESTION AND >50 ug/mL AT 12 HOURS AFTER INGESTION ARE OFTEN ASSOCIATED WITH TOXIC REACTIONS.   Basic metabolic panel     Status: Abnormal   Collection Time: 10/26/15  5:11 PM  Result Value Ref Range   Sodium 135 135 - 145 mmol/L   Potassium 4.5 3.5 - 5.1 mmol/L  Chloride 98 (L) 101 - 111 mmol/L   CO2 18 (L) 22 - 32 mmol/L   Glucose, Bld 275 (H) 65 - 99 mg/dL   BUN 19 6 - 20 mg/dL   Creatinine, Ser 1.08 (H) 0.44 - 1.00 mg/dL   Calcium 8.2 (L) 8.9 - 10.3 mg/dL   GFR calc non Af Amer 57 (L) >60 mL/min   GFR calc Af Amer >60 >60 mL/min    Comment: (NOTE) The eGFR has been calculated using the CKD EPI equation. This calculation has not been validated in all clinical situations. eGFR's persistently <60 mL/min signify possible Chronic Kidney Disease.    Anion gap 19 (H) 5 - 15    Current Facility-Administered Medications  Medication Dose Route Frequency Provider Last Rate Last Dose  . alum & mag hydroxide-simeth (MAALOX/MYLANTA) 200-200-20 MG/5ML suspension 30 mL  30 mL Oral PRN Noemi Chapel, MD      . hydrOXYzine (ATARAX/VISTARIL) tablet 25 mg  25 mg Oral Q6H PRN Patrecia Pour, NP   25 mg at 10/27/15 0435  . ibuprofen (ADVIL,MOTRIN) tablet 600 mg  600  mg Oral Q8H PRN Noemi Chapel, MD   600 mg at 10/27/15 0526  . loperamide (IMODIUM) capsule 2-4 mg  2-4 mg Oral PRN Patrecia Pour, NP      . LORazepam (ATIVAN) tablet 0-4 mg  0-4 mg Oral 4 times per day Noemi Chapel, MD   2 mg at 10/27/15 0622   Followed by  . [START ON 10/28/2015] LORazepam (ATIVAN) tablet 0-4 mg  0-4 mg Oral Q12H Noemi Chapel, MD      . LORazepam (ATIVAN) tablet 1 mg  1 mg Oral Q6H PRN Patrecia Pour, NP   1 mg at 10/26/15 1623  . multivitamin with minerals tablet 1 tablet  1 tablet Oral Daily Patrecia Pour, NP   1 tablet at 10/27/15 9518  . nicotine (NICODERM CQ - dosed in mg/24 hours) patch 21 mg  21 mg Transdermal Daily Noemi Chapel, MD   21 mg at 10/27/15 8416  . ondansetron (ZOFRAN) tablet 4 mg  4 mg Oral Q8H PRN Noemi Chapel, MD      . ondansetron (ZOFRAN-ODT) disintegrating tablet 4 mg  4 mg Oral Q6H PRN Patrecia Pour, NP   4 mg at 10/26/15 1623  . thiamine (VITAMIN B-1) tablet 100 mg  100 mg Oral Daily Patrecia Pour, NP   100 mg at 10/27/15 6063   Current Outpatient Prescriptions  Medication Sig Dispense Refill  . albuterol (PROVENTIL HFA;VENTOLIN HFA) 108 (90 BASE) MCG/ACT inhaler Inhale 2 puffs into the lungs every 6 (six) hours as needed for wheezing or shortness of breath. 1 Inhaler 2  . budesonide-formoterol (SYMBICORT) 80-4.5 MCG/ACT inhaler Inhale 2 puffs into the lungs 2 (two) times daily. 1 Inhaler 0  . CALCIUM PO Take 1 tablet by mouth daily.    . diphenhydrAMINE (BENADRYL) 25 MG tablet Take 50 mg by mouth daily as needed for allergies.    . DULoxetine (CYMBALTA) 30 MG capsule Take 30 mg by mouth 2 (two) times daily.     . haloperidol (HALDOL) 1 MG tablet Take 1 mg by mouth 2 (two) times daily.  0  . ibuprofen (ADVIL,MOTRIN) 200 MG tablet Take 800 mg by mouth every 6 (six) hours as needed for headache or moderate pain.    . Menthol-Methyl Salicylate (ICY HOT EXTRA STRENGTH) 10-30 % CREA Apply 1 application topically daily as needed (for knee pain).    Marland Kitchen  Multiple Vitamin (MULTIVITAMIN WITH MINERALS) TABS tablet Take 1 tablet by mouth daily. 30 tablet 1  . pantoprazole (PROTONIX) 40 MG tablet Take 1 tablet (40 mg total) by mouth daily at 6 (six) AM. 30 tablet 0  . POTASSIUM PO Take 1 tablet by mouth daily.    Marland Kitchen thiamine 100 MG tablet Take 1 tablet (100 mg total) by mouth daily. 30 tablet 0  . tiotropium (SPIRIVA) 18 MCG inhalation capsule Place 1 capsule (18 mcg total) into inhaler and inhale daily. 30 capsule 0  . traZODone (DESYREL) 50 MG tablet Take 50 mg by mouth at bedtime.  0  . feeding supplement, ENSURE ENLIVE, (ENSURE ENLIVE) LIQD Take 237 mLs by mouth 2 (two) times daily between meals. (Patient not taking: Reported on 10/17/2015) 237 mL 12  . LORazepam (ATIVAN) 2 MG tablet Take 2 tablets (4 mg total) by mouth every 6 (six) hours as needed for anxiety or sleep. (Patient not taking: Reported on 10/17/2015) 30 tablet 0    Musculoskeletal: Strength & Muscle Tone: within normal limits Gait & Station: normal Patient leans: N/A  Psychiatric Specialty Exam: Review of Systems  Constitutional: Negative.   HENT: Negative.   Eyes: Negative.   Respiratory: Negative.   Cardiovascular: Negative.   Gastrointestinal: Negative.   Genitourinary: Negative.   Musculoskeletal: Negative.   Skin: Negative.   Neurological: Negative.   Endo/Heme/Allergies: Negative.   Psychiatric/Behavioral: Positive for depression, suicidal ideas and substance abuse.    Blood pressure 165/94, pulse 88, temperature 98.3 F (36.8 C), temperature source Oral, resp. rate 20, SpO2 95 %.There is no weight on file to calculate BMI.  General Appearance: Disheveled  Eye Sport and exercise psychologist::  Fair  Speech:  Slow  Volume:  Decreased  Mood:  Depressed  Affect:  Congruent  Thought Process:  Coherent  Orientation:  Full (Time, Place, and Person)  Thought Content:  Rumination  Suicidal Thoughts:  Yes.  without intent/plan  Homicidal Thoughts:  No  Memory:  Immediate;    Fair Recent;   Fair Remote;   Fair  Judgement:  Impaired  Insight:  Fair  Psychomotor Activity:  Decreased  Concentration:  Fair  Recall:  [employer] of Knowledge:Fair  Language: Fair  Akathisia:  No  Handed:  Right  AIMS (if indicated):     Assets:  Leisure Time Resilience  ADL's:  Intact  Cognition: WNL  Sleep:      Treatment Plan Summary: Daily contact with patient to assess and evaluate symptoms and progress in treatment, Medication management and Plan alcohol induced mood disorder  -Crisis stabilization -Medication management:  Ativan alcohol detox protocol in place with CIWA, Celexa 10 mg daily for depression started, and gabapentin 200 mg TID for withdrawal symptoms and anxiety -Individual and substance abuse counseling  Disposition: Recommend psychiatric Inpatient admission when medically cleared.  Waylan Boga, [place] 10/27/2015 11:19 AM Patient seen face-to-face for psychiatric evaluation, chart reviewed and case discussed with the physician extender and developed treatment plan. Reviewed the information documented and agree with the treatment plan. Corena Pilgrim, MD

## 2015-10-27 NOTE — ED Notes (Addendum)
O2 sats while ambulating 97% on [facility] air. [facility]

## 2015-10-27 NOTE — Progress Notes (Signed)
Writer reassessed pt at this time. Pt sitting in wheelchair with sitter present. Pt became tearful asking if she could go to the ED d/t pain on her RLQ. Pt then began to ask about other rehab tx facilities. Pt became tearful, asking how come she can't have IV Ativan, Haldol and Trazodone. Writer explained to pt that meds are ordered and scheduled by MD. Pt reported that she was on IV Ativan at the ED for detox and how it's not fair that we can't give her IV meds here. Pt not able to process anything that writer was explaining to her. Pt continued to ask for several meds.  Pt reported to sitter that she was in coma before at the hosp and she liked that because she couldn't think or feel.

## 2015-10-27 NOTE — Progress Notes (Signed)
Admission note: [facility] unsteady, tremulous, tearful and unable to follow directions. During admission, pt had a difficult time processing information d/t her main focus being, if I can given her IV Ativan. When writer explained to pt, that [facility] do [facility]. Pt had a meltdown and started crying. Pt reported that she must have IV Ativan or she wants to be discharged. Writer explained to pt that our policy do not allow for us to administer IV medications. Pt unsteady during skin assessment. Writer provided pt with a wheelchair. Pt taken onto the unit. Pt have been asking for Ativan nonstop since arriving onto the unit. Writer reviewed medications and med schedule with pt. Pt have not been compliant with staying in wheelchair. Writer continues to monitor pt and reeducate pt as needed for safety.

## 2015-10-27 NOTE — Tx Team (Signed)
Initial Interdisciplinary Treatment Plan   PATIENT STRESSORS: Health problems Substance abuse   PATIENT STRENGTHS: Ability for insight Capable of independent living Motivation for treatment/growth   PROBLEM LIST: Problem List/Patient Goals Date to be addressed Date deferred Reason deferred Estimated date of resolution  "Depression" 10/27/15     "Alcohol abuse" 10/27/15     "lack of support" 10/27/15                                          DISCHARGE CRITERIA:  Ability to meet basic life and health needs Adequate post-discharge living arrangements Improved stabilization in mood, thinking, and/or behavior Verbal commitment to aftercare and medication compliance  PRELIMINARY DISCHARGE PLAN: Attend aftercare/continuing care group Attend PHP/IOP  PATIENT/FAMIILY INVOLVEMENT: This treatment plan has been presented to and reviewed with the patient, Rhonda Mitchell, and/or family member.  The patient and family have been given the opportunity to ask questions and make suggestions.  Daryan Buell L 10/27/2015, 4:02 PM

## 2015-10-27 NOTE — Progress Notes (Signed)
11:56am. [facility] Pt signed voluntary paperwork, CSW faxed to assessment office. Pt can transport via [facility].   Mariann LasterAlexandra Joseline Mccampbell LCSWA Clinical Social Worker [facility] phone: [phone]

## 2015-10-27 NOTE — ED Notes (Signed)
[facility] here for transport. 

## 2015-10-27 NOTE — Progress Notes (Signed)
See nursing 1:1 notes on [facility] flowsheet  

## 2015-10-27 NOTE — Progress Notes (Signed)
Patient did attend the evening speaker AA meeting.  

## 2015-10-27 NOTE — ED Notes (Signed)
Patient denies pain and is resting comfortably.  

## 2015-10-27 NOTE — Progress Notes (Addendum)
At approximately 1820, pt reported to [facility] that she had a fall. According to Caren, [facility], pt was lying in bed at time of complaint. Writer followed up with pt and assessed pt. No injury or bruising noted. Pt c/o wrist pain and abd pain. No bruising noted to either site. Fall was unwitnessed and pt observed lying in bed. Per [place], [facility]., pt was placed in bed with her assistants after returning from supper. Pt anxious at this time and continues to ask for Ativan and how soon she can have it. Pt continues to state that she needs IV Ativan. Pt made aware several times since admission, that [facility] not [facility]. Writer spoke with Maryjo RochesterShalita, RN., [facility] and  Gearldine Bienenstockankia, NP., and patient made a 1:1 for safety. Pt v/s stable. Pt B/P have decreased from earlier 142/72, 85, 19, 100%, 97.9.   It was reported by patient roommate that pt verbalized to her, prior to incident, that she's trying to get pain meds. Vistaril given to pt prn by Morrie SheldonAshley, RN. Pt will remain on 1:1 observation for safety until d/c'd.  1:1 Note- Pt lying in bed resting with sitter at bedside. Pt have a wheelchair in place d/t unsteady gait. Pt safety maintained.

## 2015-10-27 NOTE — ED Notes (Signed)
Patient is resting comfortably, sitter at bedside  

## 2015-10-27 NOTE — ED Notes (Signed)
[facility], RN is questioning pt's vitals due to fact that pt was placed on O2 yesterday. Explained to RN that patient required O2 while she was intoxicated, however they are still asking for additional VS.

## 2015-10-27 NOTE — ED Notes (Signed)
Patient is resting comfortably. 

## 2015-10-28 ENCOUNTER — Encounter (HOSPITAL_COMMUNITY): Payer: Self-pay | Admitting: Psychiatry

## 2015-10-28 DIAGNOSIS — F332 Major depressive disorder, recurrent severe without psychotic features: Secondary | ICD-10-CM | POA: Diagnosis present

## 2015-10-28 DIAGNOSIS — F10230 Alcohol dependence with withdrawal, uncomplicated: Secondary | ICD-10-CM

## 2015-10-28 MED ORDER — NICOTINE 21 MG/24HR TD PT24
21.0000 mg | MEDICATED_PATCH | Freq: Every day | TRANSDERMAL | Status: DC
  Administered 2015-10-28 – 2015-11-05 (×9): 21 mg via TRANSDERMAL
  Filled 2015-10-28 (×12): qty 1

## 2015-10-28 MED ORDER — GUAIFENESIN-DM 100-10 MG/5ML PO SYRP
5.0000 mL | ORAL_SOLUTION | ORAL | Status: DC | PRN
  Administered 2015-10-28 – 2015-11-05 (×17): 5 mL via ORAL
  Filled 2015-10-28 (×18): qty 5

## 2015-10-28 MED ORDER — DULOXETINE HCL 30 MG PO CPEP
30.0000 mg | ORAL_CAPSULE | Freq: Every day | ORAL | Status: DC
  Administered 2015-10-28 – 2015-11-05 (×9): 30 mg via ORAL
  Filled 2015-10-28 (×8): qty 1
  Filled 2015-10-28: qty 7
  Filled 2015-10-28 (×4): qty 1

## 2015-10-28 NOTE — Progress Notes (Signed)
Patient will move to 306-2 due to medical bed available.

## 2015-10-28 NOTE — [facility] Counselor (Signed)
CSW attempted to meet with pt this afternoon to complete PSA/discuss aftercare. Pt sleeping in room. CSW to re-assess in the AM.  Trula SladeHeather Smart, MSW, LCSW Clinical Social Worker 10/28/2015 3:30 PM
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
## 2015-10-28 NOTE — Progress Notes (Signed)
D: Patient woke up tonight stating she was having withdrawal symptoms which included anxiety, upset stomach, nausea.  Patient states, "I feel 60% better."   Patient did ambulate with the gait belt up the hall tonight  Patient denies SI/HI and denies AVH. A: Staff to monitor Q 15 mins for safety.  Encouragement and support offered.  Scheduled medications administered per orders.  Ativan administered prn for withdrawal symptoms and Maalox administered prn for indigestion. Zofran administered prn for nausea. R: Patient remains safe on the unit.  Patient did not attend group tonight.  Patient not visible on the unit.  Patient taking administered medications.

## 2015-10-28 NOTE — Progress Notes (Signed)
1:1 observation note:  Patient tearful stating she is in pain.  Wants to see the MD right away.  Patient had stool incontinence and refused to change her pants.  Currently lying in bed.  Uncooperative.  Will notify Dr. Dub MikesLugo of patient's wishes.  She remains a high fall risk.  Continue 1:1.

## 2015-10-28 NOTE — Progress Notes (Signed)
NUTRITION ASSESSMENT  Pt identified as at risk on the Malnutrition Screen Tool  INTERVENTION: 1. Educated patient on the importance of nutrition and encouraged intake of food and beverages. 2. Discussed weight goals. 3. Supplements: continue Ensure Enlive po BID, each supplement provides 350 kcal and 20 grams of protein  NUTRITION DIAGNOSIS: Unintentional weight loss related to sub-optimal intake as evidenced by pt report.   Goal: Pt to meet >/= 90% of their estimated nutrition needs.  Monitor:  PO intake  Assessment:  Pt seen for MST. Unable to obtain information from pt at this time. Pt admitted for alcohol abuse, SI. Per weight hx as listed below, pt has been having weight fluctuations x11 months. Most recently, she gained 19 lbs in the past 2 months. Prior to this, she had lost 10 lbs (10% body weight) in 4 months which is significant for time frame.  Continue Ensure Enlive BID.  54 y.o. female  Height: Ht Readings from Last 1 Encounters:  10/27/15 5' (1.524 m)    Weight: Wt Readings from Last 1 Encounters:  10/27/15 109 lb (49.442 kg)    Weight Hx: Wt Readings from Last 10 Encounters:  10/27/15 109 lb (49.442 kg)  08/25/15 90 lb 1.6 oz (40.869 kg)  05/07/15 100 lb (45.36 kg)  03/19/15 107 lb 5.8 oz (48.7 kg)  03/15/15 100 lb (45.36 kg)  01/03/15 98 lb (44.453 kg)  12/09/14 104 lb 0.9 oz (47.2 kg)  12/01/14 101 lb 8 oz (46.04 kg)  05/23/14 112 lb (50.803 kg)  04/23/14 120 lb (54.432 kg)    BMI:  Body mass index is 21.29 kg/(m^2). Pt meets criteria for normal weight based on current BMI.  Estimated Nutritional Needs: Kcal: 25-30 kcal/kg Protein: > 1 gram protein/kg Fluid: 1 ml/kcal  Diet Order: Diet Heart Room service appropriate?: Yes; Fluid consistency:: Thin Pt is also offered choice of unit snacks mid-morning and mid-afternoon.  Pt is eating as desired.   Lab results and medications reviewed.      Trenton GammonJessica Skyylar Kopf, RD, LDN Inpatient Clinical  Dietitian Pager # [phone] After hours/weekend pager # [phone]

## 2015-10-28 NOTE — Progress Notes (Signed)
1:1 observation note:  Patient sleeping quietly.  Patient had informed MD earlier that she fell and hit her head yesterday.  She is complaining of pain in her neck and head.  Patient is tearful at times and is eager to get her medication.  Informed patient she only has tylenol ordered for pain which she refused.  She is on the ativan protocol.  She has been incontinent of stool.  Patient remains 1:1 due high fall risk.  Patient presented depressed and sad.  She is tearful at times.  She rates her depression and hopelessness as a 5; anxiety as an 8.  She reports withdrawal symptoms of tremors, agitation, cramping and nausea.  Her appetite has been good.
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
## 2015-10-28 NOTE — Plan of Care (Signed)
Problem: Alteration in mood & ability to function due to Goal: STG-Patient will attend groups Outcome: Progressing Pt did attend AA group this evening

## 2015-10-28 NOTE — Progress Notes (Signed)
Recreation Therapy Notes  Date: 11.07.2016 Time: 9:30am Location: [address] Dayroom  Group Topic: Stress Management  Goal Area(s) Addresses:  Patient will actively participate in stress management techniques presented during session.   Behavioral Response: Did not attend.   Marykay Lexenise L Binta Statzer, LRT/CTRS  Atlee Kluth L 10/28/2015 10:20 AM

## 2015-10-28 NOTE — [facility] Group Notes (Signed)
[facility] LCSW Group Therapy  10/28/2015 1:09 PM  Type of Therapy:  Group Therapy  Participation Level:  Did Not Attend-pt chose to remain in bed.   Modes of Intervention:  Confrontation, Discussion, Education, Exploration, Problem-solving, Rapport Building, Socialization and Support  Summary of Progress/Problems: Today's Topic: Overcoming Obstacles. Patients identified one short term goal and potential obstacles in reaching this goal. Patients processed barriers involved in overcoming these obstacles. Patients identified steps necessary for overcoming these obstacles and explored motivation (internal and external) for facing these difficulties head on.   Smart, Herrick Hartog LCSW 10/28/2015, 1:09 PM  

## 2015-10-28 NOTE — Progress Notes (Signed)
Pt did not attend evening AA group because pt was asleep.

## 2015-10-28 NOTE — [facility] Group Notes (Signed)
[facility]   10/28/2015 9:36 AM  Participation Quality:  Invited-DID NOT ATTEND. Pt chose to remain in bed.   Smart, American FinancialHeather LCSWA

## 2015-10-28 NOTE — Progress Notes (Signed)
Nursing 1:1 Note D: Patient resting in bed with eyes closed.  Respirations even and unlabored.  Patient appears to be in no apparent distress. A: Staff to monitor Q 15 mins for safety.  Patient remains on 1:1 for fall risk safety. R:Patient remains safe on the unit.  

## 2015-10-28 NOTE — Tx Team (Signed)
Interdisciplinary Treatment Plan Update (Adult)  Date:  10/28/2015  Time Reviewed:  8:33 AM   Progress in Treatment: Attending groups: No. Participating in groups:  No. Taking medication as prescribed:  Yes. Tolerating medication:  Yes. Family/Significant othe contact made:  SPE required for this pt.  Patient understands diagnosis:  Yes. and As evidenced by:  seeking treatment for alcohol abuse, depression, Passive Si, and for medication stabilization Discussing patient identified problems/goals with staff:  Yes. Medical problems stabilized or resolved:  Yes. Denies suicidal/homicidal ideation: Yes. Issues/concerns per patient self-inventory:  Other:  New problem(s) identified:  Pt on 1:1 due to alleged fall. Pt appears to be med seeking and requested IV pain meds.   Discharge Plan or Barriers: CSW assessing for appropriate referrals. At this time, Pt is not attending groups. Hx at [facility] for med management.  Reason for Continuation of Hospitalization: Depression Medication stabilization Withdrawal symptoms  Comments:  Rhonda Mitchell is an 54 y.o. female who presents to [facility] voluntarily via EMS for detox. Patient presented with shortness of breath and wheezing upon arrival. Respiratory was consulted. Patient was later assessed and states that she would like detox. She states that she recently went to [facility] and was "turned down because I don't have insurance, but they are getting me in to see somebody." Patient states that she does not have any planned appointments for psychiatry or therapy at this time but "they're working on it." Patient states that she drinks four pints of Liquor per day and tried to stop yesterday. Patient states that she kept the alcohol beside her bed and slowly drank it to prevent symptoms of withdrawal and decided to come to the hospital for detox. Patient states that she has drank four pints of alcohol daily for the past year. Patient states that she used  cocaine and heroin in the past but has not used in eight years. Patient states that she "substitutes the alcohol" for the other drugs that she used in the past. Patient denies SI currently but states that she really wants to stop drinking and is afraid that she will become suicidal if released and she has to detox without being monitored. Patient states that two of her brothers committed suicide four months ago and she thought about "stabbing myself in the jugular and bleeding out." Patient states that she was afraid and did not follow through with this plan. Patient states that ten years ago she attempted suicide by cutting her arm. Patient denies other attempts/gestures. Patient denies HI and history of being violent towards others. Patient states that she has 4 pending charges for DUI's and her court dates start on November 05, 2015. Patient states that she is also on probation for "stealing from [employer]." Patient denies access to firearms or weapons. Patient denies AVH but states that sometimes she hears her brothers' voices telling her to "do better." Patient does not appear to be responding to internal stimuli. Patient BAL 258 and UDS clear at this time, Patient is alert and oriented x4. Patient talks loudly and appears angry. Patient continues to request medicine to stop the withdrawal symptoms. Patients sitter was informed. Patient is currently in her bed under a blanket with oxygen and IV. Patient states that oxygen makes her thirsty and requested more to drink. Patients sitter informed of patients request. Patient appeared partially impaired due to BAL of 258 upon arrival and being tangential at times. Patient made poor eye contact and her hands were shaking during the assessment. Patient states that  she was in "[place]" for two months earlier this year and states that she is unable to remember other inpatient treatments at this time. Patient states that she generally sleeps about 2 hours per night but has not  been asleep or eaten in 4 days. Patient states that she is hungry at this time. Diagnosis: 303.90 Alcohol use disorder, Severe  Estimated length of stay:  3-5 days   New goal(s): to develop effective aftercare plan.   Additional Comments:  Patient and CSW reviewed pt's identified goals and treatment plan. Patient verbalized understanding and agreed to treatment plan. CSW reviewed [facility] "Discharge Process and Patient Involvement" Form. Pt verbalized understanding of information provided and signed form.    Review of initial/current patient goals per problem list:  1. Goal(s): Patient will participate in aftercare plan  Met: No.   Target date: at discharge  As evidenced by: Patient will participate within aftercare plan AEB aftercare provider and housing plan at discharge being identified.  11/7: CSW assessing for appropriate referrals.   2. Goal (s): Patient will exhibit decreased depressive symptoms and suicidal ideations.  Met: No.    Target date: at discharge  As evidenced by: Patient will utilize self rating of depression at 3 or below and demonstrate decreased signs of depression or be deemed stable for discharge by MD.  11/7: Pt rates depression as high. Denies SI/HI/AVH.   4. Goal(s): Patient will demonstrate decreased signs of withdrawal due to substance abuse  Met:No.   Target date:at discharge   As evidenced by: Patient will produce a CIWA/COWS score of 0, have stable vitals signs, and no symptoms of withdrawal.  11/7: Pt reports severe withdrawal symptoms with CIWA score of 11 and high sitting/standing BP.    Attendees: Patient:   10/28/2015 8:33 AM   Family:   10/28/2015 8:33 AM   Physician:  Dr. Carlton Adam, MD 10/28/2015 8:33 AM   Nursing:    10/28/2015 8:33 AM   Clinical Social Worker: Maxie Better, [place]  10/28/2015 8:33 AM   Clinical Social Worker: Erasmo Downer Drinkard LCSWA; Peri Maris LCSWA 10/28/2015 8:33 AM   Other:   10/28/2015 8:33 AM   Other:   Lucinda Dell; [facility]  10/28/2015 8:33 AM   Other:   10/28/2015 8:33 AM   Other:  10/28/2015 8:33 AM   Other:  10/28/2015 8:33 AM   Other:  10/28/2015 8:33 AM    10/28/2015 8:33 AM    10/28/2015 8:33 AM    10/28/2015 8:33 AM    10/28/2015 8:33 AM    Scribe for Treatment Team:   Maxie Better, LCSW 10/28/2015 8:33 AM

## 2015-10-29 MED ORDER — LORAZEPAM 1 MG PO TABS
2.0000 mg | ORAL_TABLET | Freq: Once | ORAL | Status: AC
  Administered 2015-10-29: 2 mg via ORAL

## 2015-10-29 MED ORDER — ALBUTEROL SULFATE HFA 108 (90 BASE) MCG/ACT IN AERS
1.0000 | INHALATION_SPRAY | Freq: Four times a day (QID) | RESPIRATORY_TRACT | Status: DC | PRN
  Administered 2015-10-29 – 2015-11-05 (×10): 2 via RESPIRATORY_TRACT
  Filled 2015-10-29: qty 6.7

## 2015-10-29 MED ORDER — LORAZEPAM 1 MG PO TABS
ORAL_TABLET | ORAL | Status: AC
  Filled 2015-10-29: qty 2

## 2015-10-29 NOTE — [facility] Group Notes (Signed)
The focus of this group is to educate the patient on the purpose and policies of crisis stabilization and provide a format to answer questions about their admission.  The group details unit policies and expectations of patients while admitted.  Patient did not attend 0900 nurse education orientation group this morning.  Patient stayed in bed.   

## 2015-10-29 NOTE — Progress Notes (Signed)
Patient ID: Rhonda Mitchell, female   DOB: 1961-05-10, 54 y.o.   MRN: [id number]  Pt sitting in group room watching television and drinking coffee. Pt in no current distress. Sitter at side.   Pt states, tearfully, "I'm feeling anxious. I need to go. I cannot be waiting around to leave, I need to be on the run."   Pt emotionally supported and encouraged in a 1:1. Pt reports she would like to speak to social worker about options for discharge. Pt ambulates back to her room with assistance from sitter.

## 2015-10-29 NOTE — Progress Notes (Signed)
Recreation Therapy Notes  Animal-Assisted Activity (AAA) Program Checklist/Progress Notes Patient Eligibility Criteria Checklist & Daily Group note for Rec Tx Intervention  Date: 11.08.2016 Time: 2:45pm Location: [address] Dayroom    AAA/T Program Assumption of Risk Form signed by Patient/ or Parent Legal Guardian yes  Patient is free of allergies or sever asthma yes  Patient reports no fear of animals yes  Patient reports no history of cruelty to animals yes  Patient understands his/her participation is voluntary yes  Patient washes hands before animal contact yes  Patient washes hands after animal contact yes  Behavioral Response: Appropriate   Education: Hand Washing, Appropriate Animal Interaction   Education Outcome: Acknowledges education.   Clinical Observations/Feedback: Patient engaged appropriately with therapy dog and peers during session.    Maie Kesinger L Tyre Beaver, LRT/CTRS        Ginger Leeth L 10/29/2015 3:12 PM 

## 2015-10-29 NOTE — Progress Notes (Signed)
[facility]  10/29/2015 5:20 PM Rhonda GillesMichelle Mitchell  MRN:  [id number] Subjective:  Rhonda DusterMichelle continues to have a hard time. The worry builds up to acute agitation. Worried about her legal charges. She states she needs to go to rehab as does not think she is going to be able to make it otherwise and was told that she needs to clear her legal situation before she can be admitted to a rehab program. Increasingly anxious Principal Problem: Alcohol-induced mood disorder (HCC) Diagnosis:   Patient Active Problem List   Diagnosis Date Noted  . Severe recurrent major depression without psychotic features (HCC) [F33.2] 10/28/2015  . Alcohol dependence with uncomplicated withdrawal (HCC) [F10.230] 10/27/2015  . Alcohol-induced mood disorder (HCC) [F10.94] 10/27/2015  . Psychosis [F29] 08/21/2015  . Hypomagnesemia [E83.42]   . Protein-calorie malnutrition (HCC) [E46]   . Hepatitis C antibody test positive [R89.4]   . Elevated LFTs [R79.89]   . Hyperkalemia [E87.5]   . Severe protein-calorie malnutrition (HCC) [E43]   . Acute respiratory failure with hypoxia (HCC) [J96.01] 08/03/2015  . Septic olecranon bursitis of right elbow [M70.21, B96.89] 05/08/2015  . Septic bursitis of elbow [M71.129] 05/07/2015  . Thrombocytopenia (HCC) [D69.6] 05/07/2015  . Chronic hepatitis C without hepatic coma (HCC) [B18.2] 05/07/2015  . Hypokalemia [E87.6] 05/07/2015  . Abscess of bursa of elbow [M71.029] 05/07/2015  . COPD exacerbation (HCC) [J44.1] 03/18/2015  . Hyponatremia [E87.1] 03/18/2015  . Hyperglycemia [R73.9] 03/18/2015  . Alcohol dependence (HCC) [F10.20] 12/07/2014  . Alcohol withdrawal (HCC) [F10.239] 12/07/2014  . Protein-calorie malnutrition, severe (HCC) [E43] 12/07/2014  . Musculoskeletal chest pain [R07.89] 12/07/2014  . Tobacco abuse [Z72.0] 12/07/2014  . Carbuncle and furuncle [L02.92, L02.93] 12/07/2014  . Macrocytic anemia [D53.9] 12/07/2014  . Chest pain [R07.9] 11/30/2014  .  Tachycardia [R00.0] 11/30/2014  . COPD (chronic obstructive pulmonary disease) (HCC) [J44.9] 11/30/2014  . Cellulitis and abscess [L03.90, L02.91] 11/30/2014  . Atypical chest pain [R07.89]   . Dehydration [E86.0]    Total Time spent with patient: 30 minutes  Past Psychiatric History: see admission H and P  Past Medical History:  Past Medical History  Diagnosis Date  . COPD (chronic obstructive pulmonary disease) (HCC)   . Anxiety   . Shortness of breath   . Arthritis   . GERD (gastroesophageal reflux disease)   . Full dentures   . Insomnia   . Alcohol abuse   . Hepatitis C   . Alcohol abuse   . History of MRSA infection   . History of encephalopathy     Past Surgical History  Procedure Laterality Date  . Arm surgery    . Hand surgery  1990    both rt/lt carpal tunnels  . Shoulder surgery      right and left-age [age]  . Orif ulnar / radial shaft fracture  1990    right-got infection-had total 10 surgeies 1990  . Orif ulnar fracture Left 05/23/2014    Procedure: OPEN REDUCTION INTERNAL FIXATION (ORIF) LEFT ULNAR FRACTURE;  Surgeon: Harvie JuniorJohn L Graves, MD;  Location: [facility];  Service: Orthopedics;  Laterality: Left;  . I&d extremity Right 05/07/2015    Procedure: IRRIGATION AND DEBRIDEMENT  ELBOW ;  Surgeon: Dominica SeverinWilliam Gramig, MD;  Location: [facility] OR;  Service: Orthopedics;  Laterality: Right;   Family History:  Family History  Problem Relation Age of Onset  . Breast cancer Mother    Family Psychiatric  History: see Admission H and P Social History:  History  Alcohol Use  . Yes    Comment: 1/5th Vodka every day      History  Drug Use No    Comment: Hx: herion, cocaine 5 yrs ago    Social History   Social History  . Marital Status: Legally Separated    Spouse Name: N/A  . Number of Children: N/A  . Years of Education: N/A   Social History Main Topics  . Smoking status: Current Every Day Smoker -- 1.00 packs/day for 20 years    Types: Cigarettes  .  Smokeless tobacco: Never Used  . Alcohol Use: Yes     Comment: 1/5th Vodka every day   . Drug Use: No     Comment: Hx: herion, cocaine 5 yrs ago  . Sexual Activity: Yes    Birth Control/ Protection: Post-menopausal   Other Topics Concern  . None   Social History Narrative   Additional Social History:                         Sleep: Fair  Appetite:  Fair  Current Medications: Current Facility-Administered Medications  Medication Dose Route Frequency Provider Last Rate Last Dose  . acetaminophen (TYLENOL) tablet 650 mg  650 mg Oral Q6H PRN Charm Rings, NP      . albuterol (PROVENTIL HFA;VENTOLIN HFA) 108 (90 BASE) MCG/ACT inhaler 1-2 puff  1-2 puff Inhalation Q6H PRN Rachael Fee, MD   2 puff at 10/29/15 1536  . alum & mag hydroxide-simeth (MAALOX/MYLANTA) 200-200-20 MG/5ML suspension 30 mL  30 mL Oral Q4H PRN Charm Rings, NP   30 mL at 10/28/15 2231  . DULoxetine (CYMBALTA) DR capsule 30 mg  30 mg Oral Daily Rachael Fee, MD   30 mg at 10/29/15 0806  . feeding supplement (ENSURE ENLIVE) (ENSURE ENLIVE) liquid 237 mL  237 mL Oral BID BM Rachael Fee, MD   237 mL at 10/29/15 1500  . gabapentin (NEURONTIN) capsule 100 mg  100 mg Oral TID Charm Rings, NP   100 mg at 10/29/15 1701  . guaiFENesin-dextromethorphan (ROBITUSSIN DM) 100-10 MG/5ML syrup 5 mL  5 mL Oral Q4H PRN Worthy Flank, NP   5 mL at 10/29/15 1541  . hydrOXYzine (ATARAX/VISTARIL) tablet 25 mg  25 mg Oral Q6H PRN Oneta Rack, NP   25 mg at 10/28/15 0235  . loperamide (IMODIUM) capsule 2-4 mg  2-4 mg Oral PRN Oneta Rack, NP      . LORazepam (ATIVAN) tablet 1 mg  1 mg Oral Q6H PRN Oneta Rack, NP   1 mg at 10/28/15 2234  . LORazepam (ATIVAN) tablet 1 mg  1 mg Oral BID Oneta Rack, NP   1 mg at 10/29/15 1702   Followed by  . [START ON 10/31/2015] LORazepam (ATIVAN) tablet 1 mg  1 mg Oral Daily Oneta Rack, NP      . magnesium hydroxide (MILK OF MAGNESIA) suspension 30 mL  30 mL Oral  Daily PRN Charm Rings, NP      . multivitamin with minerals tablet 1 tablet  1 tablet Oral Daily Oneta Rack, NP   1 tablet at 10/29/15 0810  . nicotine (NICODERM CQ - dosed in mg/24 hours) patch 21 mg  21 mg Transdermal Daily Rachael Fee, MD   21 mg at 10/29/15 0810  . ondansetron (ZOFRAN-ODT) disintegrating tablet 4 mg  4 mg Oral Q6H PRN Oneta Rack,  NP   4 mg at 10/29/15 0939  . thiamine (B-1) injection 100 mg  100 mg Intramuscular Once Oneta Rack, NP   100 mg at 10/27/15 1628  . thiamine (VITAMIN B-1) tablet 100 mg  100 mg Oral Daily Oneta Rack, NP   100 mg at 10/29/15 0806  . traZODone (DESYREL) tablet 50 mg  50 mg Oral QHS PRN Worthy Flank, NP   50 mg at 10/27/15 2104    Lab Results: No results found for this or any previous visit (from the past 48 hour(s)).  Physical Findings: AIMS: Facial and Oral Movements Muscles of Facial Expression: None, normal Lips and Perioral Area: None, normal Jaw: None, normal Tongue: None, normal,Extremity Movements Upper (arms, wrists, hands, fingers): None, normal Lower (legs, knees, ankles, toes): None, normal, Trunk Movements Neck, shoulders, hips: None, normal, Overall Severity Severity of abnormal movements (highest score from questions above): None, normal Incapacitation due to abnormal movements: None, normal Patient's awareness of abnormal movements (rate only patient's report): No Awareness, Dental Status Current problems with teeth and/or dentures?: Yes Does patient usually wear dentures?: Yes  CIWA:  CIWA-Ar Total: 11 COWS:     Musculoskeletal: Strength & Muscle Tone: within normal limits Gait & Station: normal Patient leans: normal  Psychiatric Specialty Exam: Review of Systems  Constitutional: Positive for malaise/fatigue.  Eyes: Negative.   Respiratory: Positive for cough.   Cardiovascular: Negative.   Gastrointestinal: Positive for nausea.  Genitourinary: Negative.   Musculoskeletal: Positive for back  pain and neck pain.  Skin: Negative.   Neurological: Positive for weakness.  Endo/Heme/Allergies: Negative.   Psychiatric/Behavioral: Positive for depression and substance abuse. The patient is nervous/anxious.     Blood pressure 119/74, pulse 102, temperature 98.3 F (36.8 C), temperature source Oral, resp. rate 20, height 5' (1.524 m), weight 49.442 kg (109 lb), SpO2 100 %.Body mass index is 21.29 kg/(m^2).  General Appearance: Fairly Groomed  Patent attorney::  Fair  Speech:  Clear and Coherent and but slurred at times   Volume:  fluctuates  Mood:  Anxious, Depressed and Dysphoric  Affect:  Labile and Tearful  Thought Process:  Coherent and Goal Directed  Orientation:  Full (Time, Place, and Person)  Thought Content:  symptoms events worries concerns  Suicidal Thoughts:  No  Homicidal Thoughts:  No  Memory:  Immediate;   Fair Recent;   Fair Remote;   Fair  Judgement:  Fair  Insight:  Present and Shallow  Psychomotor Activity:  Restlessness  Concentration:  Fair  Recall:  [employer] of Knowledge:Fair  Language: Fair  Akathisia:  No  Handed:  Right  AIMS (if indicated):     Assets:  Desire for Improvement  ADL's:  Intact  Cognition: WNL  Sleep:  Number of Hours: 5.5   Treatment Plan Summary: Daily contact with patient to assess and evaluate symptoms and progress in treatment and Medication management Supportive approach/coping skills Alcohol dependence; continue the Ativan detox protocol/work a relapse prevention plan Depression; will continue to work with the Cymbalta 30 mg daily consider increasing to 60 mg Anxiety; will continue to work with the Neurontin Use CBT/mindfulness/breathing exercises  Facilitate clarifying her legal status, help establish communication with her lawyer Explore residential treatment options Rhonda Mitchell A 10/29/2015, 5:20 PM

## 2015-10-29 NOTE — Progress Notes (Signed)
Pt attended evening AA group. 

## 2015-10-29 NOTE — Progress Notes (Signed)
D: Patient in the hallway on first approach today.  Patient states, "I am feeling a little better but I am having withdrawals i think."  Patient states I have to go to rehab when leave.  Patient states I need help.  Patient denies SI/HI and denies AVH.  Patient remains on 1:1 for safety. A: Staff to monitor Q 15 mins for safety.  Encouragement and support offered.  Scheduled medications administered per orders. R: Patient remains safe on the unit.  Patient attended group tonight.  Patient visible on the unit.
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
## 2015-10-29 NOTE — Progress Notes (Addendum)
Patient ID: Rhonda Mitchell, female   DOB: 11/29/1961, 54 y.o.   MRN: [id number]  Pt is sitting in group room during afternoon group. Pt rests her arms on the walker that is in front of her, watching her hand tremors. Sitter at her side.  Pt monitored from doorway. Pt attended but had minimal participation.  Pt in no current distress. Will continue to monitor.

## 2015-10-29 NOTE — Progress Notes (Signed)
D: Patient resting in bed with eyes closed.  Respirations even and unlabored.  Patient appears to be in no apparent distress. A: Staff to monitor Q 15 mins for safety.   R:Patient remains safe on the unit.  

## 2015-10-29 NOTE — [facility] Group Notes (Signed)
[facility] LCSW Group Therapy  10/29/2015 1:06 PM  Type of Therapy:  Group Therapy  Participation Level:  Did Not Attend-pt invited. Chose to remain in bed.   Modes of Intervention:  Confrontation, Discussion, Education, Exploration, Problem-solving, Rapport Building, Socialization and Support  Summary of Progress/Problems:  MHA Speaker absent today. CSW provided pts with Mental Health Association pamphlet and discussed services provided. The pt processed ways by which to relate to the speaker. MHA speaker provided handouts and educational information pertaining to groups and services offered by the [facility].   Smart, Albana Saperstein LCSW 10/29/2015, 1:06 PM

## 2015-10-29 NOTE — Progress Notes (Addendum)
Patient ID: Sherrilee GillesMichelle Holcomb, female   DOB: December 18, 1961, 54 y.o.   MRN: [id number]  Pt currently presents with a flat affect and impulsive, depressed behavior. Per self inventory, pt rates depression at a 4, hopelessness 4 and anxiety 4. Pt's daily goal is to "work on getting better" and they intend to do so by "pay attention and be nice to other people, do what I am supposed to do." Pt reports fair sleep, a good appetite, low energy and good concentration.Pt reports increased cravings, mood lability, nausea, chills and headache today. Pt also reports moderate itching, pt noted scratching already existing scabs on her face and neck. Pt gait slightly unsteady, pt unaware of physical limitations at this time.   Pt provided with medications per providers orders. Pt's labs and vitals were monitored throughout the day. Pt supported emotionally and encouraged to express concerns and questions. Pt educated on medications and infection prevention (due to scratching her skin). Pt maintained on a 1:1. MD notified about patients increase in withdrawal symptoms. Order for as needed walker placed.   Pt's safety ensured with 15 minute and environmental checks. Pt currently denies SI/HI and A/V hallucinations. Pt verbally agrees to seek staff if SI/HI or A/VH occurs and to consult with staff before acting on these thoughts. Pt asks Clinical research associatewriter to speak with a counselor about discharge plan. After speaking with counselor, pt becomes tearful sand states "thank you guys for sticking by my side." Will continue POC.

## 2015-10-29 NOTE — Progress Notes (Signed)
Patient ID: Rhonda GillesMichelle Mitchell, female   DOB: [birth year], 54 y.o.   MRN: [id number]  Pt in bed lying down. Pt reports increased tremors, nausea and agitation. Pt seen picking scabs on her face, tearful. Asks Clinical research associatewriter " Can't you just get me a drink (of alcohol)." Sitter at bedside.   Pt given one time dose of anxiety medication per MD orders, given antinausea medication. Pt emotionally encouraged and supported.   Pt remains in bed. Pt reports her plan is to rest and try to feel better so she can "love on the puppies when they get here." Will continue to monitor.

## 2015-10-30 MED ORDER — LORAZEPAM 1 MG PO TABS
1.0000 mg | ORAL_TABLET | Freq: Four times a day (QID) | ORAL | Status: DC | PRN
  Administered 2015-10-30 – 2015-11-05 (×15): 1 mg via ORAL
  Filled 2015-10-30 (×16): qty 1

## 2015-10-30 NOTE — Progress Notes (Signed)
1:1 note: Pt has been walking on the hallway and sometimes seats in the dayroom. No complains at this time, no falls observed. Pt denies SI, HI and contracted for safety. Staff with the pt, will continue to monitor.

## 2015-10-30 NOTE — Tx Team (Deleted)
Initial Interdisciplinary Treatment Plan   PATIENT STRESSORS: Financial difficulties Health problems Substance abuse   PATIENT STRENGTHS: Ability for insight Communication skills Motivation for treatment/growth   PROBLEM LIST: Problem List/Patient Goals Date to be addressed Date deferred Reason deferred Estimated date of resolution  "I want to stop using drugs" 10/30/15     " Be able to live a decent life" 10/30/15     Drug abuse 10/30/15     Depression 10/30/15     HI 10/30/15                              DISCHARGE CRITERIA:  Ability to meet basic life and health needs Improved stabilization in mood, thinking, and/or behavior Medical problems require only outpatient monitoring Verbal commitment to aftercare and medication compliance  PRELIMINARY DISCHARGE PLAN: Attend aftercare/continuing care group Attend PHP/IOP Attend 12-step recovery group Return to previous living arrangement  PATIENT/FAMIILY INVOLVEMENT: This treatment plan has been presented to and reviewed with the patient, Rhonda Mitchell, and/or family member.  The patient and family have been given the opportunity to ask questions and make suggestions.  Bethann PunchesJane O Embry Huss 10/30/2015, 3:52 PM

## 2015-10-30 NOTE — BHH Group Notes (Signed)
B[facility] LCSW Group Therapy  10/30/2015 12:45 PM  Type of Therapy:  Group Therapy  Participation Level:  Did Not Attend-invited. Chose to remain in bed.   Modes of Intervention:  Confrontation, Discussion, Education, Exploration, Problem-solving, Rapport Building, Socialization and Support  Summary of Progress/Problems: Today's Topic: Overcoming Obstacles. Patients identified one short term goal and potential obstacles in reaching this goal. Patients processed barriers involved in overcoming these obstacles. Patients identified steps necessary for overcoming these obstacles and explored motivation (internal and external) for facing these difficulties head on.   Smart, Leiyah Maultsby LCSW 10/30/2015, 12:45 PM

## 2015-10-30 NOTE — Progress Notes (Signed)
D: Patient at the nursing station on first approach.  Patient states, "I need my medicine."  Writer informed patient she did not have anything scheduled.  Patient asked for Ativan for her anxiety.  Patient has been loud and demanding but is redirectable.  Patient denies SI/HI and denies AVH. A: Staff to monitor Q 15 mins for safety.  Encouragement and support offered.  No scheduled medications administered per orders.  Ativan administered prn for anxiety.  Robitussin administered prn for cough and Trazodone administered prn for sleep. R: Patient remains safe on the unit.  Patient attended group tonight.  Patient visible on the unit and interacting with peers.  Patient taking administered medications.

## 2015-10-30 NOTE — Progress Notes (Signed)
D: Patient resting in bed with eyes closed.  Respirations even and unlabored.  Patient appears to be in no apparent distress. A: Staff to monitor Q 15 mins for safety.  Patient remains on 1:1 for safety. R:  Patient remains safe on the unit.  

## 2015-10-30 NOTE — BHH Suicide Risk Assessment (Signed)
B[facility] INPATIENT:  Family/Significant Other Suicide Prevention Education  Suicide Prevention Education:  Education Completed; Lavena BullionJames Kellam (pt's boyfriend) [phone] has been identified by the patient as the family member/significant other with whom the patient will be residing, and identified as the person(s) who will aid the patient in the event of a mental health crisis (suicidal ideations/suicide attempt).  With written consent from the patient, the family member/significant other has been provided the following suicide prevention education, prior to the and/or following the discharge of the patient.  The suicide prevention education provided includes the following:  Suicide risk factors  Suicide prevention and interventions  National Suicide Hotline telephone number  [facility] assessment telephone number  [facility] Emergency Assistance 911  [facility] and/or Residential Mobile Crisis Unit telephone number  Request made of family/significant other to:  Remove weapons (e.g., guns, rifles, knives), all items previously/currently identified as safety concern.    Remove drugs/medications (over-the-counter, prescriptions, illicit drugs), all items previously/currently identified as a safety concern.  The family member/significant other verbalizes understanding of the suicide prevention education information provided.  The family member/significant other agrees to remove the items of safety concern listed above.  Smart, Marwin Primmer LCSW 10/30/2015, 4:18 PM

## 2015-10-30 NOTE — Progress Notes (Signed)
Pt stated that today was horrible. She just want to run away and not come back . She doesn't feel like she is getting the help that she needs while she is here.

## 2015-10-30 NOTE — Progress Notes (Signed)
1:1 note. Pt is currently seated in the dayroom watching TV and talking to peers. Pt denied SI, HI. No fall at this time. Safety maintained, staff by the side, will continue to monitor.

## 2015-10-30 NOTE — Clinical Social Work Note (Signed)
CSW spoke with pt's attorney who agreed to fax letter stating that pt does not have to show up for court until 11/30, possibly making her eligible for [facility]'s 14 day program. [facility]spoke with Caroline-[facility] is not an option for pt at this time. [facility] referral sent.   Trula SladeHeather Smart, MSW, LCSW Clinical Social Worker 10/30/2015 4:18 PM

## 2015-10-30 NOTE — Progress Notes (Signed)
Recreation Therapy Notes  Date: 10/30/15 Time: 930 Location: 300 Hall Dayroom  Group Topic: Stress Management  Goal Area(s) Addresses:  Patient will verbalize importance of using healthy stress management.  Patient will identify positive emotions associated with healthy stress management.   Intervention: Stress Management  Activity :  Progressive Muscle Relaxation.  LRT introduced and educated patients on stress management technique of progressive muscle relaxation.  A script was used to deliver the technique to patients and patients were asked to follow script read allowed by LRT to engage in practicing the stress management technique.    Education:  Stress Management, Discharge Planning.   Education Outcome: Acknowledges edcuation/In group clarification offered/Needs additional education  Clinical Observations/Feedback:  Patient did not attend group.    [name], LRT/CTRS         [name] 10/30/2015 4:15 PM 

## 2015-10-30 NOTE — [facility] Group Notes (Signed)
[facility]   10/30/2015 12:39 PM  Participation Quality:  Invited-DID NOT ATTEND.  Smart, Amyrie Illingworth LCSW

## 2015-10-30 NOTE — Progress Notes (Addendum)
Nursing 1:1 Note D: Patient resting in bed with eyes closed.  Respirations even and unlabored.  Patient appears to be in no apparent distress. A: Staff to monitor Q 15 mins for safety.   R:Patient remains safe on the unit.'   

## 2015-10-30 NOTE — Progress Notes (Signed)
Pt has been visible in the dayroom watching TV with peers. Pt has been calm no fall witness. Pt denies SI HI at this time. Pt stayed in the unit during supper, staff is with the pt. Safety maintained, will continue to monitor.

## 2015-10-30 NOTE — Progress Notes (Signed)
[facility]  10/30/2015 4:28 PM Rhonda Mitchell  MRN:  [id number] Subjective:  Rhonda Mitchell continues to struggle. Evidences increased anxiety building up to panic. She is still endorsing that she needs to go to a residential treatment program. States she is not going to be able to make it if she does not go. She admits she is under a lot of stress as he has all the legal charges. Now that she has been sober and have had a chance to process what is going on in her life she has become more anxious worried upset Principal Problem: Alcohol-induced mood disorder (HCC) Diagnosis:   Patient Active Problem List   Diagnosis Date Noted  . Severe recurrent major depression without psychotic features (HCC) [F33.2] 10/28/2015  . Alcohol dependence with uncomplicated withdrawal (HCC) [F10.230] 10/27/2015  . Alcohol-induced mood disorder (HCC) [F10.94] 10/27/2015  . Psychosis [F29] 08/21/2015  . Hypomagnesemia [E83.42]   . Protein-calorie malnutrition (HCC) [E46]   . Hepatitis C antibody test positive [R89.4]   . Elevated LFTs [R79.89]   . Hyperkalemia [E87.5]   . Severe protein-calorie malnutrition (HCC) [E43]   . Acute respiratory failure with hypoxia (HCC) [J96.01] 08/03/2015  . Septic olecranon bursitis of right elbow [M70.21, B96.89] 05/08/2015  . Septic bursitis of elbow [M71.129] 05/07/2015  . Thrombocytopenia (HCC) [D69.6] 05/07/2015  . Chronic hepatitis C without hepatic coma (HCC) [B18.2] 05/07/2015  . Hypokalemia [E87.6] 05/07/2015  . Abscess of bursa of elbow [M71.029] 05/07/2015  . COPD exacerbation (HCC) [J44.1] 03/18/2015  . Hyponatremia [E87.1] 03/18/2015  . Hyperglycemia [R73.9] 03/18/2015  . Alcohol dependence (HCC) [F10.20] 12/07/2014  . Alcohol withdrawal (HCC) [F10.239] 12/07/2014  . Protein-calorie malnutrition, severe (HCC) [E43] 12/07/2014  . Musculoskeletal chest pain [R07.89] 12/07/2014  . Tobacco abuse [Z72.0] 12/07/2014  . Carbuncle and furuncle [L02.92, L02.93]  12/07/2014  . Macrocytic anemia [D53.9] 12/07/2014  . Chest pain [R07.9] 11/30/2014  . Tachycardia [R00.0] 11/30/2014  . COPD (chronic obstructive pulmonary disease) (HCC) [J44.9] 11/30/2014  . Cellulitis and abscess [L03.90, L02.91] 11/30/2014  . Atypical chest pain [R07.89]   . Dehydration [E86.0]    Total Time spent with patient: 30 minutes  Past Psychiatric History: see admission H and P  Past Medical History:  Past Medical History  Diagnosis Date  . COPD (chronic obstructive pulmonary disease) (HCC)   . Anxiety   . Shortness of breath   . Arthritis   . GERD (gastroesophageal reflux disease)   . Full dentures   . Insomnia   . Alcohol abuse   . Hepatitis C   . Alcohol abuse   . History of MRSA infection   . History of encephalopathy     Past Surgical History  Procedure Laterality Date  . Arm surgery    . Hand surgery  1990    both rt/lt carpal tunnels  . Shoulder surgery      right and left-age [age]  . Orif ulnar / radial shaft fracture  1990    right-got infection-had total 10 surgeies 1990  . Orif ulnar fracture Left 05/23/2014    Procedure: OPEN REDUCTION INTERNAL FIXATION (ORIF) LEFT ULNAR FRACTURE;  Surgeon: Harvie JuniorJohn L Graves, MD;  Location: [facility];  Service: Orthopedics;  Laterality: Left;  . I&d extremity Right 05/07/2015    Procedure: IRRIGATION AND DEBRIDEMENT  ELBOW ;  Surgeon: Dominica SeverinWilliam Gramig, MD;  Location: [facility];  Service: Orthopedics;  Laterality: Right;   Family History:  Family History  Problem Relation Age of Onset  .  Breast cancer Mother    Family Psychiatric  History: see admission H and P Social History:  History  Alcohol Use  . Yes    Comment: 1/5th Vodka every day      History  Drug Use No    Comment: Hx: herion, cocaine 5 yrs ago    Social History   Social History  . Marital Status: Legally Separated    Spouse Name: N/A  . Number of Children: N/A  . Years of Education: N/A   Social History Main Topics  . Smoking  status: Current Every Day Smoker -- 1.00 packs/day for 20 years    Types: Cigarettes  . Smokeless tobacco: Never Used  . Alcohol Use: Yes     Comment: 1/5th Vodka every day   . Drug Use: No     Comment: Hx: herion, cocaine 5 yrs ago  . Sexual Activity: Yes    Birth Control/ Protection: Post-menopausal   Other Topics Concern  . None   Social History Narrative   Additional Social History:                         Sleep: Fair  Appetite:  Fair  Current Medications: Current Facility-Administered Medications  Medication Dose Route Frequency Provider Last Rate Last Dose  . acetaminophen (TYLENOL) tablet 650 mg  650 mg Oral Q6H PRN Charm Rings, NP   650 mg at 10/30/15 0408  . albuterol (PROVENTIL HFA;VENTOLIN HFA) 108 (90 BASE) MCG/ACT inhaler 1-2 puff  1-2 puff Inhalation Q6H PRN Rachael Fee, MD   2 puff at 10/29/15 1536  . alum & mag hydroxide-simeth (MAALOX/MYLANTA) 200-200-20 MG/5ML suspension 30 mL  30 mL Oral Q4H PRN Charm Rings, NP   30 mL at 10/28/15 2231  . DULoxetine (CYMBALTA) DR capsule 30 mg  30 mg Oral Daily Rachael Fee, MD   30 mg at 10/30/15 0829  . feeding supplement (ENSURE ENLIVE) (ENSURE ENLIVE) liquid 237 mL  237 mL Oral BID BM Rachael Fee, MD   237 mL at 10/30/15 1411  . gabapentin (NEURONTIN) capsule 100 mg  100 mg Oral TID Charm Rings, NP   100 mg at 10/30/15 1143  . guaiFENesin-dextromethorphan (ROBITUSSIN DM) 100-10 MG/5ML syrup 5 mL  5 mL Oral Q4H PRN Worthy Flank, NP   5 mL at 10/30/15 0835  . [START ON 10/31/2015] LORazepam (ATIVAN) tablet 1 mg  1 mg Oral Daily Oneta Rack, NP      . LORazepam (ATIVAN) tablet 1 mg  1 mg Oral Q6H PRN Rachael Fee, MD   1 mg at 10/30/15 1314  . magnesium hydroxide (MILK OF MAGNESIA) suspension 30 mL  30 mL Oral Daily PRN Charm Rings, NP      . multivitamin with minerals tablet 1 tablet  1 tablet Oral Daily Oneta Rack, NP   1 tablet at 10/30/15 0829  . nicotine (NICODERM CQ - dosed in mg/24  hours) patch 21 mg  21 mg Transdermal Daily Rachael Fee, MD   21 mg at 10/30/15 0829  . thiamine (B-1) injection 100 mg  100 mg Intramuscular Once Oneta Rack, NP   100 mg at 10/27/15 1628  . thiamine (VITAMIN B-1) tablet 100 mg  100 mg Oral Daily Oneta Rack, NP   100 mg at 10/30/15 0829  . traZODone (DESYREL) tablet 50 mg  50 mg Oral QHS PRN Worthy Flank, NP  50 mg at 10/29/15 2117    Lab Results: No results found for this or any previous visit (from the past 48 hour(s)).  Physical Findings: AIMS: Facial and Oral Movements Muscles of Facial Expression: None, normal Lips and Perioral Area: None, normal Jaw: None, normal Tongue: None, normal,Extremity Movements Upper (arms, wrists, hands, fingers): None, normal Lower (legs, knees, ankles, toes): None, normal, Trunk Movements Neck, shoulders, hips: None, normal, Overall Severity Severity of abnormal movements (highest score from questions above): None, normal Incapacitation due to abnormal movements: None, normal Patient's awareness of abnormal movements (rate only patient's report): No Awareness, Dental Status Current problems with teeth and/or dentures?: Yes Does patient usually wear dentures?: Yes  CIWA:  CIWA-Ar Total: 3 COWS:     Musculoskeletal: Strength & Muscle Tone: within normal limits Gait & Station: unsteady Patient leans: normal  Psychiatric Specialty Exam: Review of Systems  Constitutional: Positive for malaise/fatigue.  HENT: Negative.   Eyes: Negative.   Respiratory: Negative.   Cardiovascular: Negative.   Gastrointestinal: Negative.   Genitourinary: Negative.   Musculoskeletal: Positive for back pain.  Skin: Negative.   Neurological: Positive for weakness.  Endo/Heme/Allergies: Negative.   Psychiatric/Behavioral: Positive for depression and substance abuse. The patient is nervous/anxious.     Blood pressure 115/66, pulse 87, temperature 97.4 F (36.3 C), temperature source Oral, resp. rate 18,  height 5' (1.524 m), weight 49.442 kg (109 lb), SpO2 100 %.Body mass index is 21.29 kg/(m^2).  General Appearance: Fairly Groomed  Patent attorney::  Fair  Speech:  At times slurred  Volume:  fluctuates  Mood:  Anxious, Depressed, Dysphoric and Irritable  Affect:  Labile  Thought Process:  Coherent and Goal Directed  Orientation:  Full (Time, Place, and Person)  Thought Content:  symptoms envents worries concerns  Suicidal Thoughts:  No  Homicidal Thoughts:  No  Memory:  Immediate;   Fair Recent;   Fair Remote;   Fair  Judgement:  Fair  Insight:  Shallow  Psychomotor Activity:  Restlessness  Concentration:  Fair  Recall:  [employer] of Knowledge:Fair  Language: Fair  Akathisia:  No  Handed:  Right  AIMS (if indicated):     Assets:  Desire for Improvement  ADL's:  Intact  Cognition: WNL  Sleep:  Number of Hours: 5.5   Treatment Plan Summary: Daily contact with patient to assess and evaluate symptoms and progress in treatment and Medication management Supportive approach/coping skills Alcohol dependence: continue ativan detox protocol/work a relapse prevention plan Depression-anxiety; continue the Cymbalta 30 mg daily Anxiety-agitation; continue the Neurontin 100 mg TID Insomnia; continue the Trazodone 50 mg HS CBT/mindfulness explore residential treatment options Angeleen Horney A 10/30/2015, 4:28 PM

## 2015-10-31 MED ORDER — HALOPERIDOL 1 MG PO TABS
1.0000 mg | ORAL_TABLET | Freq: Two times a day (BID) | ORAL | Status: DC
  Administered 2015-10-31 – 2015-11-01 (×2): 1 mg via ORAL
  Filled 2015-10-31 (×7): qty 1

## 2015-10-31 MED ORDER — LORAZEPAM 1 MG PO TABS
1.0000 mg | ORAL_TABLET | Freq: Once | ORAL | Status: AC
  Administered 2015-10-31: 1 mg via ORAL
  Filled 2015-10-31: qty 1

## 2015-10-31 NOTE — Progress Notes (Signed)
Patient ID: Rhonda GillesMichelle Mitchell, female   DOB: June 25, 1961, 54 y.o.   MRN: [id number] 1:1 notes  10/31/2015 @ 2200  D: Patient in dayroom talking to peer. Pt is alert, pleasant and cooperative. [facility] Pt stated she has not taking previous detox seriously but reports she is older and need to stop drinking. Pt gait is a lot better ambulating without a walker. [place] [facility] Pt asked for sleep medication early.  A: 1:1 observation for safety. Pt encourage to deep breathe. Medication given as prescribed.  R: Ptt is safe reports feeling less anxious. Sitter at bedside. 1:1 continues

## 2015-10-31 NOTE — [facility] Group Notes (Signed)
[facility] LCSW Group Therapy  10/31/2015 12:49 PM  Type of Therapy:  Group Therapy  Participation Level:  Did Not Attend-pt chose to rest  Modes of Intervention:  Confrontation, Discussion, Education, Exploration, Problem-solving, Rapport Building, Socialization and Support  Summary of Progress/Problems: Emotion Regulation: This group focused on both positive and negative emotion identification and allowed group members to process ways to identify feelings, regulate negative emotions, and find healthy ways to manage internal/external emotions. Group members were asked to reflect on a time when their reaction to an emotion led to a negative outcome and explored how alternative responses using emotion regulation would have benefited them. Group members were also asked to discuss a time when emotion regulation was utilized when a negative emotion was experienced.   Smart, Darline Faith LCSW 10/31/2015, 12:49 PM

## 2015-10-31 NOTE — Progress Notes (Signed)
Patient ID: Rhonda GillesMichelle Mitchell, female   DOB: [birth year], 54 y.o.   MRN: [id number]  .Pt lying in bed. Pt in no current distress.   Sitter at bedside, sitters switched. Pt given an ensure per orders.   Pt remains safe on a 1:1.

## 2015-10-31 NOTE — Progress Notes (Signed)
Patient ID: Rhonda GillesMichelle Mitchell, female   DOB: 17-Mar-1961, 54 y.o.   MRN: [id number]  Pt sitting in cafeteria eating dinner. Pt smiling and talking to another pt.   Pt monitored. Sitter remains at side.  Pt remains safe at this time. Pt in no current distress.

## 2015-10-31 NOTE — Progress Notes (Signed)
D: Patient resting in bed with eyes closed.  Respirations even and unlabored.  Patient appears to be in no apparent distress. A: Staff to monitor Q 15 mins for safety.   R:Patient remains safe on the unit.  

## 2015-10-31 NOTE — Progress Notes (Signed)
Patient ID: Rhonda GillesMichelle Bushee, female   DOB: [birth year], 54 y.o.   MRN: [id number]   Pt currently presents with a blunted affect and anxious behavior. Per self inventory, pt rates depression, hopelessness and anxiety at 10. Pt's daily goal is to "try to get into rehab" and they intend to do so by "talk to social worker." Pt reports poor sleep, a fair appetite, low energy and poor concentration. Pt brighter today, pt tearful events are less common today. Pt jokes with staff on the unit.   Pt provided with medications per providers orders. Pt's labs and vitals were monitored throughout the day. Pt supported emotionally and encouraged to express concerns and questions. Pt educated on medications, fall risk safety and alcohol withdrawal.  Pt's safety ensured with 15 minute and environmental checks. Pt currently denies SI/HI and A/V hallucinations. Pt verbally agrees to seek staff if SI/HI or A/VH occurs and to consult with staff before acting on these thoughts. Pt walks on unit with sitter at side without walker today. Pt gait increasingly steady. Pt reports to Clinical research associatewriter that she is hopeful about discharge plans. Will continue POC.

## 2015-10-31 NOTE — Progress Notes (Signed)
Patient ID: Rhonda Mitchell, female   DOB: [birth year], 54 y.o.   MRN: [id number]  Pt sitting in dayroom while group SW is talking. Sitter is next to pt.   Pt monitored. 1:1 maintained.   Pt remains safe at this time. Will continue to monitor.

## 2015-10-31 NOTE — Progress Notes (Signed)
Nursing 1:1 Note D: Patient resting in bed with eyes closed.  Respirations even and unlabored.  Patient appears to be in no apparent distress. A: Staff to monitor Q 15 mins for safety.   R:Patient remains safe on the unit.'   

## 2015-10-31 NOTE — Progress Notes (Signed)
[facility] MD Progress Note  10/31/2015 4:37 PM Rhonda Mitchell  MRN:  [id number] Subjective:  Rhonda Mitchell is trying to get her life back together. She continues to ask for the opportunity of going to a residential treatment program. State she wants more help in order to be able to stay sober. Worried about relapsing given what she went trough this time around. She continues to have occasional episodes of anxiety. She starts worrying builds herself up with catastrophic thinking Principal Problem: Alcohol-induced mood disorder (HCC) Diagnosis:   Patient Active Problem List   Diagnosis Date Noted  . Severe recurrent major depression without psychotic features (HCC) [F33.2] 10/28/2015  . Alcohol dependence with uncomplicated withdrawal (HCC) [F10.230] 10/27/2015  . Alcohol-induced mood disorder (HCC) [F10.94] 10/27/2015  . Psychosis [F29] 08/21/2015  . Hypomagnesemia [E83.42]   . Protein-calorie malnutrition (HCC) [E46]   . Hepatitis C antibody test positive [R89.4]   . Elevated LFTs [R79.89]   . Hyperkalemia [E87.5]   . Severe protein-calorie malnutrition (HCC) [E43]   . Acute respiratory failure with hypoxia (HCC) [J96.01] 08/03/2015  . Septic olecranon bursitis of right elbow [M70.21, B96.89] 05/08/2015  . Septic bursitis of elbow [M71.129] 05/07/2015  . Thrombocytopenia (HCC) [D69.6] 05/07/2015  . Chronic hepatitis C without hepatic coma (HCC) [B18.2] 05/07/2015  . Hypokalemia [E87.6] 05/07/2015  . Abscess of bursa of elbow [M71.029] 05/07/2015  . COPD exacerbation (HCC) [J44.1] 03/18/2015  . Hyponatremia [E87.1] 03/18/2015  . Hyperglycemia [R73.9] 03/18/2015  . Alcohol dependence (HCC) [F10.20] 12/07/2014  . Alcohol withdrawal (HCC) [F10.239] 12/07/2014  . Protein-calorie malnutrition, severe (HCC) [E43] 12/07/2014  . Musculoskeletal chest pain [R07.89] 12/07/2014  . Tobacco abuse [Z72.0] 12/07/2014  . Carbuncle and furuncle [L02.92, L02.93] 12/07/2014  . Macrocytic anemia [D53.9]  12/07/2014  . Chest pain [R07.9] 11/30/2014  . Tachycardia [R00.0] 11/30/2014  . COPD (chronic obstructive pulmonary disease) (HCC) [J44.9] 11/30/2014  . Cellulitis and abscess [L03.90, L02.91] 11/30/2014  . Atypical chest pain [R07.89]   . Dehydration [E86.0]    Total Time spent with patient: 30 minutes  Past Psychiatric History: see Admission H and P  Past Medical History:  Past Medical History  Diagnosis Date  . COPD (chronic obstructive pulmonary disease) (HCC)   . Anxiety   . Shortness of breath   . Arthritis   . GERD (gastroesophageal reflux disease)   . Full dentures   . Insomnia   . Alcohol abuse   . Hepatitis C   . Alcohol abuse   . History of MRSA infection   . History of encephalopathy     Past Surgical History  Procedure Laterality Date  . Arm surgery    . Hand surgery  1990    both rt/lt carpal tunnels  . Shoulder surgery      right and left-age [age]  . Orif ulnar / radial shaft fracture  1990    right-got infection-had total 10 surgeies 1990  . Orif ulnar fracture Left 05/23/2014    Procedure: OPEN REDUCTION INTERNAL FIXATION (ORIF) LEFT ULNAR FRACTURE;  Surgeon: Harvie Junior, MD;  [facility];  Service: Orthopedics;  Laterality: Left;  . I&d extremity Right 05/07/2015    Procedure: IRRIGATION AND DEBRIDEMENT  ELBOW ;  Surgeon: Dominica Severin, MD;  Location: [facility] OR;  Service: Orthopedics;  Laterality: Right;   Family History:  Family History  Problem Relation Age of Onset  . Breast cancer Mother    Family Psychiatric  History: see Admission H and P Social History:  History  Alcohol Use  . Yes    Comment: 1/5th Vodka every day      History  Drug Use No    Comment: Hx: herion, cocaine 5 yrs ago    Social History   Social History  . Marital Status: Legally Separated    Spouse Name: N/A  . Number of Children: N/A  . Years of Education: N/A   Social History Main Topics  . Smoking status: Current Every Day Smoker -- 1.00  packs/day for 20 years    Types: Cigarettes  . Smokeless tobacco: Never Used  . Alcohol Use: Yes     Comment: 1/5th Vodka every day   . Drug Use: No     Comment: Hx: herion, cocaine 5 yrs ago  . Sexual Activity: Yes    Birth Control/ Protection: Post-menopausal   Other Topics Concern  . None   Social History Narrative   Additional Social History:                         Sleep: Fair  Appetite:  Fair  Current Medications: Current Facility-Administered Medications  Medication Dose Route Frequency Provider Last Rate Last Dose  . acetaminophen (TYLENOL) tablet 650 mg  650 mg Oral Q6H PRN Charm RingsJamison Y Lord, NP   650 mg at 10/31/15 1025  . albuterol (PROVENTIL HFA;VENTOLIN HFA) 108 (90 BASE) MCG/ACT inhaler 1-2 puff  1-2 puff Inhalation Q6H PRN Rachael FeeIrving A Lillyth Spong, MD   2 puff at 10/31/15 1332  . alum & mag hydroxide-simeth (MAALOX/MYLANTA) 200-200-20 MG/5ML suspension 30 mL  30 mL Oral Q4H PRN Charm RingsJamison Y Lord, NP   30 mL at 10/31/15 0740  . DULoxetine (CYMBALTA) DR capsule 30 mg  30 mg Oral Daily Rachael FeeIrving A Alaycia Eardley, MD   30 mg at 10/31/15 0737  . feeding supplement (ENSURE ENLIVE) (ENSURE ENLIVE) liquid 237 mL  237 mL Oral BID BM Rachael FeeIrving A Henok Heacock, MD   237 mL at 10/31/15 1558  . gabapentin (NEURONTIN) capsule 100 mg  100 mg Oral TID Charm RingsJamison Y Lord, NP   100 mg at 10/31/15 1205  . guaiFENesin-dextromethorphan (ROBITUSSIN DM) 100-10 MG/5ML syrup 5 mL  5 mL Oral Q4H PRN Worthy FlankIjeoma E Nwaeze, NP   5 mL at 10/31/15 1325  . LORazepam (ATIVAN) tablet 1 mg  1 mg Oral Q6H PRN Rachael FeeIrving A Ruweyda Macknight, MD   1 mg at 10/31/15 1600  . magnesium hydroxide (MILK OF MAGNESIA) suspension 30 mL  30 mL Oral Daily PRN Charm RingsJamison Y Lord, NP      . multivitamin with minerals tablet 1 tablet  1 tablet Oral Daily Oneta Rackanika N Lewis, NP   1 tablet at 10/31/15 [phone]  . nicotine (NICODERM CQ - dosed in mg/24 hours) patch 21 mg  21 mg Transdermal Daily Rachael FeeIrving A Anmol Paschen, MD   21 mg at 10/31/15 0735  . thiamine (B-1) injection 100 mg  100 mg  Intramuscular Once Oneta Rackanika N Lewis, NP   100 mg at 10/27/15 1628  . thiamine (VITAMIN B-1) tablet 100 mg  100 mg Oral Daily Oneta Rackanika N Lewis, NP   100 mg at 10/31/15 0738  . traZODone (DESYREL) tablet 50 mg  50 mg Oral QHS PRN Worthy FlankIjeoma E Nwaeze, NP   50 mg at 10/30/15 2121    Lab Results: No results found for this or any previous visit (from the past 48 hour(s)).  Physical Findings: AIMS: Facial and Oral Movements Muscles of Facial Expression: None, normal Lips and Perioral Area:  None, normal Jaw: None, normal Tongue: None, normal,Extremity Movements Upper (arms, wrists, hands, fingers): None, normal Lower (legs, knees, ankles, toes): None, normal, Trunk Movements Neck, shoulders, hips: None, normal, Overall Severity Severity of abnormal movements (highest score from questions above): None, normal Incapacitation due to abnormal movements: None, normal Patient's awareness of abnormal movements (rate only patient's report): No Awareness, Dental Status Current problems with teeth and/or dentures?: Yes Does patient usually wear dentures?: Yes  CIWA:  CIWA-Ar Total: 6 COWS:     Musculoskeletal: Strength & Muscle Tone: within normal limits Gait & Station: normal Patient leans: normal  Psychiatric Specialty Exam: Review of Systems  Constitutional: Negative.   HENT: Negative.   Eyes: Negative.   Respiratory: Negative.   Cardiovascular: Negative.   Gastrointestinal: Negative.   Genitourinary: Negative.   Musculoskeletal: Positive for back pain.  Skin: Negative.   Neurological: Negative.   Endo/Heme/Allergies: Negative.   Psychiatric/Behavioral: Positive for depression and substance abuse. The patient is nervous/anxious.     Blood pressure 107/62, pulse 77, temperature 98.4 F (36.9 C), temperature source Oral, resp. rate 16, height 5' (1.524 m), weight 49.442 kg (109 lb), SpO2 100 %.Body mass index is 21.29 kg/(m^2).  General Appearance: Fairly Groomed  Patent attorney::  Fair  Speech:   Clear and Coherent  Volume:  fluctuates  Mood:  Anxious and worried as wants to go to a rehab  Affect:  anxious worried  Thought Process:  Coherent and Goal Directed  Orientation:  Full (Time, Place, and Person)  Thought Content:  symptoms events worries concerns  Suicidal Thoughts:  No  Homicidal Thoughts:  No  Memory:  Immediate;   Fair Recent;   Fair Remote;   Fair  Judgement:  Fair  Insight:  Present  Psychomotor Activity:  Restlessness  Concentration:  Fair  Recall:  [employer] of Knowledge:Fair  Language: Fair  Akathisia:  No  Handed:  Right  AIMS (if indicated):     Assets:  Desire for Improvement  ADL's:  Intact  Cognition: WNL  Sleep:  Number of Hours: 5.5   Treatment Plan Summary: Daily contact with patient to assess and evaluate symptoms and progress in treatment and Medication management Supportive approach/coping skills Alcohol dependence: complete the detox protocol/work a relapse prevention plan Depression; use Cymbalta/Neurontin Insomnia; work with Trazodone 50 mg HS Work with CBT/mindfulness Rhonda Mitchell A 10/31/2015, 4:37 PM

## 2015-11-01 MED ORDER — HALOPERIDOL 1 MG PO TABS
1.0000 mg | ORAL_TABLET | Freq: Four times a day (QID) | ORAL | Status: DC | PRN
  Administered 2015-11-01 – 2015-11-05 (×7): 1 mg via ORAL
  Filled 2015-11-01 (×6): qty 1

## 2015-11-01 MED ORDER — LORAZEPAM 0.5 MG PO TABS
0.5000 mg | ORAL_TABLET | Freq: Once | ORAL | Status: DC
  Filled 2015-11-01: qty 1

## 2015-11-01 MED ORDER — BENZTROPINE MESYLATE 0.5 MG PO TABS
0.5000 mg | ORAL_TABLET | Freq: Two times a day (BID) | ORAL | Status: DC | PRN
  Filled 2015-11-01: qty 14

## 2015-11-01 MED ORDER — HALOPERIDOL 2 MG PO TABS
2.0000 mg | ORAL_TABLET | Freq: Two times a day (BID) | ORAL | Status: DC
  Administered 2015-11-01 – 2015-11-05 (×8): 2 mg via ORAL
  Filled 2015-11-01 (×9): qty 1
  Filled 2015-11-01: qty 14
  Filled 2015-11-01: qty 1
  Filled 2015-11-01: qty 14

## 2015-11-01 NOTE — Progress Notes (Signed)
D:  Patient is walking about the unit without assistive devices.  Patient gait is steady and she has no orthostatic changes.   A:  Observed 1:1 for safety  R:  Patient is able to ambulate ad lib.  Sitter present.  1:1 continues.

## 2015-11-01 NOTE — Progress Notes (Signed)
Patient ID: Rhonda Mitchell, female   DOB: [birth year], 54 y.o.   MRN: [id number] D: Patient pacing in hallway appeared to be interacting with peers. Pt off 1:1 observation. Pt is eager to go to [facility] but reports she was not accepted due to pending court case. Depressed mood and affect. Pt denies SI/HI/AVH and pain. Pt attended and participated in evening AA/NA group. Cooperative with assessment. No acute distressed noted at this time.   A: Met with pt 1:1. Medications administered as prescribed. Support and encouragement provided. Pt encouraged to discuss feelings and come to staff with any question or concerns.   R: Patient remains safe and complaint with medications.

## 2015-11-01 NOTE — Progress Notes (Signed)
[address] [facility]  11/01/2015 3:19 PM Sherrilee GillesMichelle Shimkus  MRN:  [id number] Subjective:  Marcelino DusterMichelle is off 1:1. Her gait is stable. She had been very anxious building up to agitation as she is not able to get clear answers in terms of [facility] and [facility]. She states that she really needs the extra help to be able to maintain her sobriety. State she needs and wants the help Principal Problem: Alcohol-induced mood disorder (HCC) Diagnosis:   Patient Active Problem List   Diagnosis Date Noted  . Severe recurrent major depression without psychotic features (HCC) [F33.2] 10/28/2015  . Alcohol dependence with uncomplicated withdrawal (HCC) [F10.230] 10/27/2015  . Alcohol-induced mood disorder (HCC) [F10.94] 10/27/2015  . Psychosis [F29] 08/21/2015  . Hypomagnesemia [E83.42]   . Protein-calorie malnutrition (HCC) [E46]   . Hepatitis C antibody test positive [R89.4]   . Elevated LFTs [R79.89]   . Hyperkalemia [E87.5]   . Severe protein-calorie malnutrition (HCC) [E43]   . Acute respiratory failure with hypoxia (HCC) [J96.01] 08/03/2015  . Septic olecranon bursitis of right elbow [M70.21, B96.89] 05/08/2015  . Septic bursitis of elbow [M71.129] 05/07/2015  . Thrombocytopenia (HCC) [D69.6] 05/07/2015  . Chronic hepatitis C without hepatic coma (HCC) [B18.2] 05/07/2015  . Hypokalemia [E87.6] 05/07/2015  . Abscess of bursa of elbow [M71.029] 05/07/2015  . COPD exacerbation (HCC) [J44.1] 03/18/2015  . Hyponatremia [E87.1] 03/18/2015  . Hyperglycemia [R73.9] 03/18/2015  . Alcohol dependence (HCC) [F10.20] 12/07/2014  . Alcohol withdrawal (HCC) [F10.239] 12/07/2014  . Protein-calorie malnutrition, severe (HCC) [E43] 12/07/2014  . Musculoskeletal chest pain [R07.89] 12/07/2014  . Tobacco abuse [Z72.0] 12/07/2014  . Carbuncle and furuncle [L02.92, L02.93] 12/07/2014  . Macrocytic anemia [D53.9] 12/07/2014  . Chest pain [R07.9] 11/30/2014  . Tachycardia [R00.0] 11/30/2014  . COPD  (chronic obstructive pulmonary disease) (HCC) [J44.9] 11/30/2014  . Cellulitis and abscess [L03.90, L02.91] 11/30/2014  . Atypical chest pain [R07.89]   . Dehydration [E86.0]    Total Time spent with patient: 30 minutes  Past Psychiatric History: see admission H and P  Past Medical History:  Past Medical History  Diagnosis Date  . COPD (chronic obstructive pulmonary disease) (HCC)   . Anxiety   . Shortness of breath   . Arthritis   . GERD (gastroesophageal reflux disease)   . Full dentures   . Insomnia   . Alcohol abuse   . Hepatitis C   . Alcohol abuse   . History of MRSA infection   . History of encephalopathy     Past Surgical History  Procedure Laterality Date  . Arm surgery    . Hand surgery  1990    both rt/lt carpal tunnels  . Shoulder surgery      right and left-age [age]  . Orif ulnar / radial shaft fracture  1990    right-got infection-had total 10 surgeies 1990  . Orif ulnar fracture Left 05/23/2014    Procedure: OPEN REDUCTION INTERNAL FIXATION (ORIF) LEFT ULNAR FRACTURE;  Surgeon: Harvie JuniorJohn L Graves, MD;  Location: [facility];  Service: Orthopedics;  Laterality: Left;  . I&d extremity Right 05/07/2015    Procedure: IRRIGATION AND DEBRIDEMENT  ELBOW ;  Surgeon: Dominica SeverinWilliam Gramig, MD;  Location: [facility] OR;  Service: Orthopedics;  Laterality: Right;   Family History:  Family History  Problem Relation Age of Onset  . Breast cancer Mother    Family Psychiatric  History: see admission H and P Social History:  History  Alcohol Use  .  Yes    Comment: 1/5th Vodka every day      History  Drug Use No    Comment: Hx: herion, cocaine 5 yrs ago    Social History   Social History  . Marital Status: Legally Separated    Spouse Name: N/A  . Number of Children: N/A  . Years of Education: N/A   Social History Main Topics  . Smoking status: Current Every Day Smoker -- 1.00 packs/day for 20 years    Types: Cigarettes  . Smokeless tobacco: Never Used  .  Alcohol Use: Yes     Comment: 1/5th Vodka every day   . Drug Use: No     Comment: Hx: herion, cocaine 5 yrs ago  . Sexual Activity: Yes    Birth Control/ Protection: Post-menopausal   Other Topics Concern  . None   Social History Narrative   Additional Social History:                         Sleep: Fair  Appetite:  Fair  Current Medications: Current Facility-Administered Medications  Medication Dose Route Frequency Provider Last Rate Last Dose  . acetaminophen (TYLENOL) tablet 650 mg  650 mg Oral Q6H PRN Charm Rings, NP   650 mg at 11/01/15 0826  . albuterol (PROVENTIL HFA;VENTOLIN HFA) 108 (90 BASE) MCG/ACT inhaler 1-2 puff  1-2 puff Inhalation Q6H PRN Rachael Fee, MD   2 puff at 11/01/15 [id number]  . alum & mag hydroxide-simeth (MAALOX/MYLANTA) 200-200-20 MG/5ML suspension 30 mL  30 mL Oral Q4H PRN Charm Rings, NP   30 mL at 10/31/15 0740  . benztropine (COGENTIN) tablet 0.5 mg  0.5 mg Oral BID PRN Rachael Fee, MD      . DULoxetine (CYMBALTA) DR capsule 30 mg  30 mg Oral Daily Rachael Fee, MD   30 mg at 11/01/15 0824  . feeding supplement (ENSURE ENLIVE) (ENSURE ENLIVE) liquid 237 mL  237 mL Oral BID BM Rachael Fee, MD   237 mL at 11/01/15 0948  . gabapentin (NEURONTIN) capsule 100 mg  100 mg Oral TID Charm Rings, NP   100 mg at 11/01/15 1211  . guaiFENesin-dextromethorphan (ROBITUSSIN DM) 100-10 MG/5ML syrup 5 mL  5 mL Oral Q4H PRN Worthy Flank, NP   5 mL at 11/01/15 0949  . haloperidol (HALDOL) tablet 1 mg  1 mg Oral Q6H PRN Rachael Fee, MD   1 mg at 11/01/15 1454  . haloperidol (HALDOL) tablet 2 mg  2 mg Oral BID Rachael Fee, MD      . LORazepam (ATIVAN) tablet 0.5 mg  0.5 mg Oral Once Rachael Fee, MD   0.5 mg at 11/01/15 1152  . LORazepam (ATIVAN) tablet 1 mg  1 mg Oral Q6H PRN Rachael Fee, MD   1 mg at 11/01/15 0950  . magnesium hydroxide (MILK OF MAGNESIA) suspension 30 mL  30 mL Oral Daily PRN Charm Rings, NP      . multivitamin with  minerals tablet 1 tablet  1 tablet Oral Daily Oneta Rack, NP   1 tablet at 11/01/15 0824  . nicotine (NICODERM CQ - dosed in mg/24 hours) patch 21 mg  21 mg Transdermal Daily Rachael Fee, MD   21 mg at 11/01/15 9604  . thiamine (B-1) injection 100 mg  100 mg Intramuscular Once Oneta Rack, NP   100 mg at 10/27/15 1628  .  thiamine (VITAMIN B-1) tablet 100 mg  100 mg Oral Daily Oneta Rack, NP   100 mg at 11/01/15 0824  . traZODone (DESYREL) tablet 50 mg  50 mg Oral QHS PRN Worthy Flank, NP   50 mg at 10/31/15 2106    Lab Results: No results found for this or any previous visit (from the past 48 hour(s)).  Physical Findings: AIMS: Facial and Oral Movements Muscles of Facial Expression: None, normal Lips and Perioral Area: None, normal Jaw: None, normal Tongue: None, normal,Extremity Movements Upper (arms, wrists, hands, fingers): None, normal Lower (legs, knees, ankles, toes): None, normal, Trunk Movements Neck, shoulders, hips: None, normal, Overall Severity Severity of abnormal movements (highest score from questions above): None, normal Incapacitation due to abnormal movements: None, normal Patient's awareness of abnormal movements (rate only patient's report): No Awareness, Dental Status Current problems with teeth and/or dentures?: Yes Does patient usually wear dentures?: Yes  CIWA:  CIWA-Ar Total: 1 COWS:     Musculoskeletal: Strength & Muscle Tone: within normal limits Gait & Station: normal Patient leans: normal  Psychiatric Specialty Exam: Review of Systems  Constitutional: Negative.   Eyes: Negative.   Respiratory: Positive for cough and shortness of breath.   Cardiovascular: Negative.   Gastrointestinal: Negative.   Genitourinary: Negative.   Musculoskeletal: Negative.   Skin: Negative.   Neurological: Negative.   Endo/Heme/Allergies: Negative.   Psychiatric/Behavioral: Positive for depression and substance abuse. The patient is nervous/anxious.      Blood pressure 104/70, pulse 91, temperature 98.4 F (36.9 C), temperature source Oral, resp. rate 20, height 5' (1.524 m), weight 49.442 kg (109 lb), SpO2 100 %.Body mass index is 21.29 kg/(m^2).  General Appearance: Fairly Groomed  Patent attorney::  Fair  Speech:  Clear and Coherent and Pressured  Volume:  fluctuates  Mood:  Anxious, Dysphoric and Irritable  Affect:  Labile  Thought Process:  Coherent and Goal Directed  Orientation:  Full (Time, Place, and Person)  Thought Content:  symptoms events worries concerns wanting to go to rehab  Suicidal Thoughts:  No  Homicidal Thoughts:  No  Memory:  Immediate;   Fair Recent;   Fair Remote;   Fair  Judgement:  Fair  Insight:  Present and Shallow  Psychomotor Activity:  Restlessness  Concentration:  Fair  Recall:  [employer] of Knowledge:Fair  Language: Fair  Akathisia:  No  Handed:  Right  AIMS (if indicated):     Assets:  Desire for Improvement  ADL's:  Intact  Cognition: WNL  Sleep:  Number of Hours: 5.5   Treatment Plan Summary: Daily contact with patient to assess and evaluate symptoms and progress in treatment and Medication management Supportive approach/coping skills Alcohol dependence; continue to work a relapse prevention plan Mood instability; continue to work with the Haldol and increase to 2 mg BID Will add cogentin 0.5 mg BID PRN EPS Depression; will continue to work with the Cymbalta 30 mg daily Anxiety: will work with the Neurontin/Haldol combination Will encourage to use breathing techniques to help with the anxiety Will explore residential treatment options Kaiden Dardis A 11/01/2015, 3:19 PM

## 2015-11-01 NOTE — [facility] Group Notes (Signed)
[facility]   11/01/2015 11:25 AM  Participation Quality:  Appropriate   Mood/Affect:  Appropriate  Depression Rating:  4  Anxiety Rating:  4  Thoughts of Suicide:  No Will you contract for safety?   NA  Current AVH:  No  Plan for Discharge/Comments:  Pt reports that she is hoping that [facility] pan out for her. Pt called [facility] last night "and talked to a very nice man who said there may be a bed available." CSW left message for Shayla this morning. Pt is now off 1:1 and continues to show motivation for treatment.   Transportation Means: unknown at this time.   Supports: boyfriend   Counselling psychologistmart, Conservation officer, natureHeather LCSW

## 2015-11-01 NOTE — Progress Notes (Signed)
D-  Patient has been present on the unit, pacing.  Patient taken off of 1:1 for safety but still on high fall risk protocol.  Patient has had complaints of congestion and received medication. Patient needed much redirection for intrusive behaviors.  Patient denies SI/HI and AVH but is very easily agitated. Patient complains of anxiety and often seeks prn medication to help alleviate anxiety.   A-  Offer patient medication as prescribed, assess for safety, engage in therapeutic staff talks.   R-  Patient able to remain calm and responded to redirection without further escalation. Patient able to contract for safety.

## 2015-11-01 NOTE — Progress Notes (Signed)
Patient ID: Rhonda GillesMichelle Mitchell, female   DOB: 02/05/1961, 54 y.o.   MRN: [id number] 1:1 notes  11/01/2015 @ 0600  D: Patient in bed sleeping. Respiration regular and unlabored. No sign of distress noted at this time A: 1:1 observation for safety R: Patient remains asleep. Sitter at bedside. 1:1 continues

## 2015-11-01 NOTE — Progress Notes (Signed)
Recreation Therapy Notes  Date: 11/01/15 Time: 930 Location: 300 Group Room  Group Topic: Stress Management  Goal Area(s) Addresses:  Patient will verbalize importance of using healthy stress management.  Patient will identify positive emotions associated with healthy stress management.   Intervention: Stress Management  Activity :  Guided Imagery.  LRT introduced the concept of guided imagery to the patients.  Patients were asked to follow along as LRT read the script to engage in the activity of guided imagery.  Education:  Stress Management, Discharge Planning.   Education Outcome: Acknowledges edcuation/In group clarification offered/Needs additional education  Clinical Observations/Feedback: Patient did not attend group.   Caroll RancherMarjette Oceane Fosse, LRT/CTRS         Lillia AbedLindsay, Richie Bonanno A 11/01/2015 1:48 PM

## 2015-11-01 NOTE — [facility] Group Notes (Signed)
[facility] LCSW Group Therapy  11/01/2015 3:28 PM  Type of Therapy:  Group Therapy  Participation Level:  Active  Participation Quality:  Monopolizing and Resistant  Affect:  Angry, Irritable and Labile  Cognitive:  Alert  Insight:  None  Engagement in Therapy:  Distracting, Monopolizing, Off Topic and Poor  Modes of Intervention:  Confrontation, Discussion, Education, Exploration, Problem-solving, Rapport Building, Socialization and Support  Summary of Progress/Problems: Feelings around Relapse. Group members discussed the meaning of relapse and shared personal stories of relapse, how it affected them and others, and how they perceived themselves during this time. Group members were encouraged to identify triggers, warning signs and coping skills used when facing the possibility of relapse. Social supports were discussed and explored in detail. Post Acute Withdrawal Syndrome (handout provided) was introduced and examined. Pt's were encouraged to ask questions, talk about key points associated with PAWS, and process this information in terms of relapse prevention. Rhonda Mitchell was attentive during group but was difficulty to redirect when she went off topic. Rhonda Mitchell began crying and yelling in group when talking about "probably not going to treatment because I can't get in." Rhonda Mitchell demonstrates no insight into her substance abuse issues. Pt tearful, labile, yelling and demanding medication to calm down. CSW asked that pt leave room and either speak with RN/MD or go to room to calm down before coming back. Pt agreeable.    Rhonda Mitchell, Rhonda Sarli LCSW 11/01/2015, 3:28 PM

## 2015-11-01 NOTE — Progress Notes (Signed)
Patient ID: Rhonda GillesMichelle Mitchell, female   DOB: Sep 29, 1961, 54 y.o.   MRN: [id number] 1:1 notes  11/01/2015 @ 0200  D: Patient in bed sleeping. Respiration regular and unlabored. No sign of distress noted at this time A: 1:1 observation for safety R: Patient remains asleep. Sitter at bedside. 1:1 continues

## 2015-11-01 NOTE — Progress Notes (Signed)
Pt agitated calling another patient "bitch" and threatening to whip her butt. Pt reports the other pt is controlling the TV and has changed the channel multiple times. Redirected pt to focus on reason she is here and plans to get well. Pt asked for medication to calm nerves but encouraged to use coping skill learned instead. Pt able to go to group. No there issues noted.

## 2015-11-01 NOTE — Tx Team (Signed)
Interdisciplinary Treatment Plan Update (Adult)  Date:  11/01/2015  Time Reviewed:  11:22 AM   Progress in Treatment: Attending groups: Yes Participating in groups:  Yes Taking medication as prescribed:  Yes. Tolerating medication:  Yes. Family/Significant othe contact made:  SPE completed with pt's boyfriend.  Patient understands diagnosis:  Yes. and As evidenced by:  seeking treatment for alcohol abuse, depression, Passive Si, and for medication stabilization Discussing patient identified problems/goals with staff:  Yes. Medical problems stabilized or resolved:  Yes. Denies suicidal/homicidal ideation: Yes. Issues/concerns per patient self-inventory:  Other:  Discharge Plan or Barriers: Pt wants [facility] referral -referral made. Court date that she must appear on is 11/30. Received letter from attorney indicating this. CSW continuing to assess. Pt does not want [facility] referral.   Reason for Continuation of Hospitalization: Depression Medication stabilization Withdrawal symptoms  Comments:  Rhonda Mitchell is an 54 y.o. female who presents to [facility] voluntarily via EMS for detox. Patient presented with shortness of breath and wheezing upon arrival. Respiratory was consulted. Patient was later assessed and states that she would like detox. She states that she recently went to [facility] and was "turned down because I don't have insurance, but they are getting me in to see somebody." Patient states that she does not have any planned appointments for psychiatry or therapy at this time but "they're working on it." Patient states that she drinks four pints of Liquor per day and tried to stop yesterday. Patient states that she kept the alcohol beside her bed and slowly drank it to prevent symptoms of withdrawal and decided to come to the hospital for detox. Patient states that she has drank four pints of alcohol daily for the past year. Patient states that she used cocaine and heroin in the past but  has not used in eight years. Patient states that she "substitutes the alcohol" for the other drugs that she used in the past. Patient denies SI currently but states that she really wants to stop drinking and is afraid that she will become suicidal if released and she has to detox without being monitored. Patient states that two of her brothers committed suicide four months ago and she thought about "stabbing myself in the jugular and bleeding out." Patient states that she was afraid and did not follow through with this plan. Patient states that ten years ago she attempted suicide by cutting her arm. Patient denies other attempts/gestures. Patient denies HI and history of being violent towards others. Patient states that she has 4 pending charges for DUI's and her court dates start on November 05, 2015. Patient states that she is also on probation for "stealing from [facility]." Patient denies access to firearms or weapons. Patient denies AVH but states that sometimes she hears her brothers' voices telling her to "do better." Patient does not appear to be responding to internal stimuli. Patient BAL 258 and UDS clear at this time, Patient is alert and oriented x4. Patient talks loudly and appears angry. Patient continues to request medicine to stop the withdrawal symptoms. Patients sitter was informed. Patient is currently in her bed under a blanket with oxygen and IV. Patient states that oxygen makes her thirsty and requested more to drink. Patients sitter informed of patients request. Patient appeared partially impaired due to BAL of 258 upon arrival and being tangential at times. Patient made poor eye contact and her hands were shaking during the assessment. Patient states that she was in "[place]" for two months earlier this year and  states that she is unable to remember other inpatient treatments at this time. Patient states that she generally sleeps about 2 hours per night but has not been asleep or eaten in 4 days.  Patient states that she is hungry at this time. Diagnosis: 303.90 Alcohol use disorder, Severe  Estimated length of stay:  3-5 days   Additional Comments:  Patient and CSW reviewed pt's identified goals and treatment plan. Patient verbalized understanding and agreed to treatment plan. CSW reviewed [facility] "Discharge Process and Patient Involvement" Form. Pt verbalized understanding of information provided and signed form.    Review of initial/current patient goals per problem list:  1. Goal(s): Patient will participate in aftercare plan  Met: Yes  Target date: at discharge  As evidenced by: Patient will participate within aftercare plan AEB aftercare provider and housing plan at discharge being identified.  11/7: CSW assessing for appropriate referrals.   11/11: [facility] referral pending.   2. Goal (s): Patient will exhibit decreased depressive symptoms and suicidal ideations.  Met: No.    Target date: at discharge  As evidenced by: Patient will utilize self rating of depression at 3 or below and demonstrate decreased signs of depression or be deemed stable for discharge by MD.  11/7: Pt rates depression as high. Denies SI/HI/AVH.   11/11: Pt rates depression as moderate. Denies SI/HI/AVH. Pt animated and excitable but easily redirected.   4. Goal(s): Patient will demonstrate decreased signs of withdrawal due to substance abuse  [id number] Progressing.   Target date:at discharge   As evidenced by: Patient will produce a CIWA/COWS score of 0, have stable vitals signs, and no symptoms of withdrawal.  11/7: Pt reports severe withdrawal symptoms with CIWA score of 11 and high sitting/standing BP.   11/11: Pt denies withdrawals today "other than some anxiety but that's always there." CIWA score of 3. Goal progressing.    Attendees: Patient:   11/01/2015 11:22 AM   Family:   11/01/2015 11:22 AM   Physician:  Dr. Carlton Adam, MD 11/01/2015 11:22 AM   Nursing:   Nell Range  RN 11/01/2015 11:22 AM   Clinical Social Worker: Maxie Better, [place]  11/01/2015 11:22 AM   Clinical Social Worker: Erasmo Downer Drinkard LCSWA; Peri Maris LCSWA 11/01/2015 11:22 AM   Other:   11/01/2015 11:22 AM   Other:  Lucinda Dell; [facility]  11/01/2015 11:22 AM   Other:   11/01/2015 11:22 AM   Other:  11/01/2015 11:22 AM   Other:  11/01/2015 11:22 AM   Other:  11/01/2015 11:22 AM    11/01/2015 11:22 AM    11/01/2015 11:22 AM    11/01/2015 11:22 AM    11/01/2015 11:22 AM    Scribe for Treatment Team:   Maxie Better, LCSW 11/01/2015 11:22 AM

## 2015-11-01 NOTE — Progress Notes (Signed)
Patient attended AA group meeting tonight.  

## 2015-11-02 MED ORDER — ONDANSETRON 4 MG PO TBDP
4.0000 mg | ORAL_TABLET | Freq: Four times a day (QID) | ORAL | Status: DC | PRN
  Administered 2015-11-02: 4 mg via ORAL
  Filled 2015-11-02: qty 1

## 2015-11-02 NOTE — Progress Notes (Signed)
D: Elzora denies SI/HI/AVH. She has complained of nausea today, with 2x emesis of small, unmeasured amounts seen by this writer after each meal. She has endorsed anxiety as well as agitation and pain. She did not participate in 0900 group because she was lying down. Pt says she's been nauseated since yesterday. She asked to be discharged since visitors would be arriving this evening. This Clinical research associatewriter as well as LCSW spoke with her about the 72-hour procedure. She declined to sign request for discharge. Pt has been visible on unit and able to verbalize needs. She reports poor sleep and appetite, low energy and poor concentration. She rates depression and hopelessness a 4, while ratign anxiety an 8.  A: Meds given as ordered, including PRNs when available/needed. Q15 safety checks maintained. Support/encouragement offered. R: Pt remains free from harm and continues with treatment. Will continue to monitor for needs/safety.

## 2015-11-02 NOTE — [facility] Group Notes (Signed)
[facility] Group Notes: (Clinical Social Work)   11/02/2015      Type of Therapy:  Group Therapy   Participation Level:  Did Not Attend despite MHT prompting   Ambrose MantleMareida Grossman-Orr, LCSW 11/02/2015, 12:53 PM

## 2015-11-02 NOTE — Clinical Social Work Note (Signed)
Clinical Social Work Note  Pt asked CSW to convey to doctor her readiness for discharge when her family comes to visit today.  CSW informed her that is not the current discharge plan, and that she was very agitated just last night, showing that she was not completely well and able to leave.  She said she is now back on her medications and they are working.  CSW informed her that she can sign the 72-hour discharge notice if she desires.  Conveyed this to doctor, who reiterated that she can be advised to sign the 72-hour paperwork.  Same was shared with RN.  Ambrose MantleMareida Grossman-Orr, LCSW 11/02/2015, 12:53 PM

## 2015-11-02 NOTE — Progress Notes (Signed)
[facility] MD Progress Note  11/02/2015 4:12 PM Rhonda Mitchell  MRN:  [id number] Subjective:  "I am sick"  Objective: Rhonda Mitchell is awake and alert with a flat affect. Patient found in her room resting. Patient reports she is very sick and has been up all night with nausea and vomiting. Denies suicidal or homicidal ideation states " I am fucking sick of answering questions" " Can you just get out" Patient was offered support, encourage and reassurance.   Staff reports patient has not attended group session, however has been interacting with peers in the milieu.   Principal Problem: Alcohol-induced mood disorder (HCC) Diagnosis:   Patient Active Problem List   Diagnosis Date Noted  . Severe recurrent major depression without psychotic features (HCC) [F33.2] 10/28/2015  . Alcohol dependence with uncomplicated withdrawal (HCC) [F10.230] 10/27/2015  . Alcohol-induced mood disorder (HCC) [F10.94] 10/27/2015  . Psychosis [F29] 08/21/2015  . Hypomagnesemia [E83.42]   . Protein-calorie malnutrition (HCC) [E46]   . Hepatitis C antibody test positive [R89.4]   . Elevated LFTs [R79.89]   . Hyperkalemia [E87.5]   . Severe protein-calorie malnutrition (HCC) [E43]   . Acute respiratory failure with hypoxia (HCC) [J96.01] 08/03/2015  . Septic olecranon bursitis of right elbow [M70.21, B96.89] 05/08/2015  . Septic bursitis of elbow [M71.129] 05/07/2015  . Thrombocytopenia (HCC) [D69.6] 05/07/2015  . Chronic hepatitis C without hepatic coma (HCC) [B18.2] 05/07/2015  . Hypokalemia [E87.6] 05/07/2015  . Abscess of bursa of elbow [M71.029] 05/07/2015  . COPD exacerbation (HCC) [J44.1] 03/18/2015  . Hyponatremia [E87.1] 03/18/2015  . Hyperglycemia [R73.9] 03/18/2015  . Alcohol dependence (HCC) [F10.20] 12/07/2014  . Alcohol withdrawal (HCC) [F10.239] 12/07/2014  . Protein-calorie malnutrition, severe (HCC) [E43] 12/07/2014  . Musculoskeletal chest pain [R07.89] 12/07/2014  . Tobacco abuse [Z72.0] 12/07/2014   . Carbuncle and furuncle [L02.92, L02.93] 12/07/2014  . Macrocytic anemia [D53.9] 12/07/2014  . Chest pain [R07.9] 11/30/2014  . Tachycardia [R00.0] 11/30/2014  . COPD (chronic obstructive pulmonary disease) (HCC) [J44.9] 11/30/2014  . Cellulitis and abscess [L03.90, L02.91] 11/30/2014  . Atypical chest pain [R07.89]   . Dehydration [E86.0]    Total Time spent with patient: 30 minutes  Past Psychiatric History: see admission H and P  Past Medical History:  Past Medical History  Diagnosis Date  . COPD (chronic obstructive pulmonary disease) (HCC)   . Anxiety   . Shortness of breath   . Arthritis   . GERD (gastroesophageal reflux disease)   . Full dentures   . Insomnia   . Alcohol abuse   . Hepatitis C   . Alcohol abuse   . History of MRSA infection   . History of encephalopathy     Past Surgical History  Procedure Laterality Date  . Arm surgery    . Hand surgery  1990    both rt/lt carpal tunnels  . Shoulder surgery      right and left-age [age]  . Orif ulnar / radial shaft fracture  1990    right-got infection-had total 10 surgeies 1990  . Orif ulnar fracture Left 05/23/2014    Procedure: OPEN REDUCTION INTERNAL FIXATION (ORIF) LEFT ULNAR FRACTURE;  Surgeon: Harvie Junior, MD;  Location: [facility];  Service: Orthopedics;  Laterality: Left;  . I&d extremity Right 05/07/2015    Procedure: IRRIGATION AND DEBRIDEMENT  ELBOW ;  Surgeon: Dominica Severin, MD;  Location: [facility];  Service: Orthopedics;  Laterality: Right;   Family History:  Family History  Problem Relation Age of Onset  .  Breast cancer Mother    Family Psychiatric  History: see admission H and P Social History:  History  Alcohol Use  . Yes    Comment: 1/5th Vodka every day      History  Drug Use No    Comment: Hx: herion, cocaine 5 yrs ago    Social History   Social History  . Marital Status: Legally Separated    Spouse Name: N/A  . Number of Children: N/A  . Years of Education:  N/A   Social History Main Topics  . Smoking status: Current Every Day Smoker -- 1.00 packs/day for 20 years    Types: Cigarettes  . Smokeless tobacco: Never Used  . Alcohol Use: Yes     Comment: 1/5th Vodka every day   . Drug Use: No     Comment: Hx: herion, cocaine 5 yrs ago  . Sexual Activity: Yes    Birth Control/ Protection: Post-menopausal   Other Topics Concern  . None   Social History Narrative   Additional Social History:             Sleep: Fair  Appetite:  Fair  Current Medications: Current Facility-Administered Medications  Medication Dose Route Frequency Provider Last Rate Last Dose  . acetaminophen (TYLENOL) tablet 650 mg  650 mg Oral Q6H PRN Charm RingsJamison Y Lord, NP   650 mg at 11/02/15 0917  . albuterol (PROVENTIL HFA;VENTOLIN HFA) 108 (90 BASE) MCG/ACT inhaler 1-2 puff  1-2 puff Inhalation Q6H PRN Rachael FeeIrving A Lugo, MD   2 puff at 11/02/15 1442  . alum & mag hydroxide-simeth (MAALOX/MYLANTA) 200-200-20 MG/5ML suspension 30 mL  30 mL Oral Q4H PRN Charm RingsJamison Y Lord, NP   30 mL at 11/02/15 0904  . benztropine (COGENTIN) tablet 0.5 mg  0.5 mg Oral BID PRN Rachael FeeIrving A Lugo, MD      . DULoxetine (CYMBALTA) DR capsule 30 mg  30 mg Oral Daily Rachael FeeIrving A Lugo, MD   30 mg at 11/02/15 [phone]  . feeding supplement (ENSURE ENLIVE) (ENSURE ENLIVE) liquid 237 mL  237 mL Oral BID BM Rachael FeeIrving A Lugo, MD   237 mL at 11/02/15 1146  . gabapentin (NEURONTIN) capsule 100 mg  100 mg Oral TID Charm RingsJamison Y Lord, NP   100 mg at 11/02/15 1146  . guaiFENesin-dextromethorphan (ROBITUSSIN DM) 100-10 MG/5ML syrup 5 mL  5 mL Oral Q4H PRN Worthy FlankIjeoma E Nwaeze, NP   5 mL at 11/02/15 0917  . haloperidol (HALDOL) tablet 1 mg  1 mg Oral Q6H PRN Rachael FeeIrving A Lugo, MD   1 mg at 11/02/15 1424  . haloperidol (HALDOL) tablet 2 mg  2 mg Oral BID Rachael FeeIrving A Lugo, MD   2 mg at 11/02/15 [phone]  . LORazepam (ATIVAN) tablet 0.5 mg  0.5 mg Oral Once Rachael FeeIrving A Lugo, MD   0.5 mg at 11/01/15 1152  . LORazepam (ATIVAN) tablet 1 mg  1 mg Oral Q6H PRN  Rachael FeeIrving A Lugo, MD   1 mg at 11/02/15 1148  . magnesium hydroxide (MILK OF MAGNESIA) suspension 30 mL  30 mL Oral Daily PRN Charm RingsJamison Y Lord, NP      . multivitamin with minerals tablet 1 tablet  1 tablet Oral Daily Oneta Rackanika N Morganne Haile, NP   1 tablet at 11/02/15 [phone]  . nicotine (NICODERM CQ - dosed in mg/24 hours) patch 21 mg  21 mg Transdermal Daily Rachael FeeIrving A Lugo, MD   21 mg at 11/02/15 [phone]  . ondansetron (ZOFRAN-ODT) disintegrating tablet 4 mg  4 mg Oral Q6H PRN Oneta Rack, NP      . thiamine (B-1) injection 100 mg  100 mg Intramuscular Once Oneta Rack, NP   100 mg at 10/27/15 1628  . thiamine (VITAMIN B-1) tablet 100 mg  100 mg Oral Daily Oneta Rack, NP   100 mg at 11/02/15 2956  . traZODone (DESYREL) tablet 50 mg  50 mg Oral QHS PRN Worthy Flank, NP   50 mg at 11/01/15 2120    Lab Results: No results found for this or any previous visit (from the past 48 hour(s)).  Physical Findings: AIMS: Facial and Oral Movements Muscles of Facial Expression: None, normal Lips and Perioral Area: None, normal Jaw: None, normal Tongue: None, normal,Extremity Movements Upper (arms, wrists, hands, fingers): None, normal Lower (legs, knees, ankles, toes): None, normal, Trunk Movements Neck, shoulders, hips: None, normal, Overall Severity Severity of abnormal movements (highest score from questions above): None, normal Incapacitation due to abnormal movements: None, normal Patient's awareness of abnormal movements (rate only patient's report): No Awareness, Dental Status Current problems with teeth and/or dentures?: Yes Does patient usually wear dentures?: Yes  CIWA:  CIWA-Ar Total: 9 COWS:     Musculoskeletal: Strength & Muscle Tone: within normal limits Gait & Station: normal Patient leans: normal  Psychiatric Specialty Exam: Review of Systems  Constitutional: Positive for malaise/fatigue.  Eyes: Negative.   Respiratory: Negative for cough and shortness of breath.   Cardiovascular:  Negative.   Gastrointestinal: Positive for nausea and vomiting.  Genitourinary: Negative.   Musculoskeletal: Negative.   Skin: Negative.   Neurological: Positive for weakness.  Endo/Heme/Allergies: Negative.   Psychiatric/Behavioral: Positive for depression and substance abuse. The patient is nervous/anxious.     Blood pressure 126/74, pulse 67, temperature 98.2 F (36.8 C), temperature source Oral, resp. rate 16, height 5' (1.524 m), weight 49.442 kg (109 lb), SpO2 100 %.Body mass index is 21.29 kg/(m^2).  General Appearance: Fairly Groomed  Patent attorney::  Fair  Speech:  Clear and Coherent and Pressured  Volume:  Increased  Mood:  Anxious and Irritable  Affect:  Depressed, Flat and Labile  Thought Process:  Coherent and Goal Directed  Orientation:  Full (Time, Place, and Person)  Thought Content:  Hallucinations: None, Rumination and symptoms events worries concerns wanting to go to rehab  Suicidal Thoughts:  No  Homicidal Thoughts:  No  Memory:  Immediate;   Fair Recent;   Fair Remote;   Fair  Judgement:  Fair  Insight:  Present and Shallow  Psychomotor Activity:  Restlessness  Concentration:  Fair  Recall:  [employer] of Knowledge:Fair  Language: Fair  Akathisia:  No  Handed:  Right  AIMS (if indicated):     Assets:  Communication Skills  ADL's:  Intact  Cognition: WNL  Sleep:  Number of Hours: 5.5    I agree with current treatment 11/02/15  Treatment Plan Summary Daily contact with patient to assess and evaluate symptoms and progress in treatment and Medication management Ordered Zofran  Q 6 PRN for nausea, Ginger al PRN Supportive approach/coping skills Alcohol dependence; continue to work a relapse prevention plan Mood instability; continue to work with the Haldol and increase to 2 mg BID Continue cogentin 0.5 mg BID PRN EPS Depression; will continue Cymbalta 30 mg daily Anxiety: will work with the Neurontin/Haldol combination Will encourage to use  breathing techniques to help with the anxiety Will explore residential treatment options  Oneta Rack FNP- [facility] 11/02/2015, 4:12  PM

## 2015-11-02 NOTE — Progress Notes (Signed)
[facility] Group Notes:  (Nursing/MHT/Case Management/Adjunct)  Date:  11/02/2015  Time:  2100 Type of Therapy:  wrap up group  Participation Level:  Active  Participation Quality:  Appropriate, Attentive, Sharing and Supportive  Affect:  Appropriate  Cognitive:  Appropriate  Insight:  Improving  Engagement in Group:  Engaged  Modes of Intervention:  Clarification, Education and Support  Summary of Progress/Problems:  Rhonda Mitchell, Rhonda Mitchell 11/02/2015, 11:17 PM

## 2015-11-02 NOTE — Progress Notes (Signed)
Patient ID: Rush Landmark, female   DOB: [birth year], 54 y.o.   MRN: [id number] D: Patient in room with husband visiting. Before visit was over pt came out crying in hallway and demanding medication to calm her nerves. Reports increase anxiety over denial into [employer]. Pt stated it's more painful when she has to talk about it.  Pt denies SI/HI/AVH and pain. Pt attended and participated in evening wrap up group. Cooperative with assessment. No physical  distress noted.   A: Met with pt 1:1. Medications administered as prescribed. Support and encouragement provided. Pt encouraged to use coping skill learned.   R: Patient remains safe and complaint with medications. Pt plans to talk to her lawyer on Monday.

## 2015-11-02 NOTE — [facility] Group Notes (Signed)
[facility] Group Notes:  (Nursing/MHT/Case Management/Adjunct)  Date:  11/02/2015  Time:  0906  Type of Therapy:  Nurse Education  Participation Level:  Did Not Attend  Participation Quality:     Affect:    Cognitive:    Insight:    Engagement in Group:    Modes of Intervention:    Summary of Progress/Problems: Patient did not attend due to not feeling well. She was invited to attend.   Maurine SimmeringShugart, Blayke Cordrey M 11/02/2015, 4:24 PM

## 2015-11-03 DIAGNOSIS — F1094 Alcohol use, unspecified with alcohol-induced mood disorder: Secondary | ICD-10-CM

## 2015-11-03 NOTE — Progress Notes (Signed)
[facility] MD Progress Note  11/03/2015 4:45 PM Rhonda Mitchell  MRN:  [id number] Subjective:  Patient reports "I am having a wonderful day today"  Objective: Rhonda Mitchell is awake and alert pleasant and cooperative. Patient found walking the halls interacting with peers.Patient reports she is having a wonderful day today. States the white pill (Zofran 4mg s) was helpful while she was very sick yesterday. Denies suicidal or homicidal ideation. states she will attend group session later today. Patient reports she has a doctors appointment on Tuesday with her primary care provider. (Dr. Herbert Seta) " I just need to leave by Tuesday" Patient was offered support, encourage and reassurance.    Principal Problem: Alcohol-induced mood disorder (HCC) Diagnosis:   Patient Active Problem List   Diagnosis Date Noted  . Severe recurrent major depression without psychotic features (HCC) [F33.2] 10/28/2015  . Alcohol dependence with uncomplicated withdrawal (HCC) [F10.230] 10/27/2015  . Alcohol-induced mood disorder (HCC) [F10.94] 10/27/2015  . Psychosis [F29] 08/21/2015  . Hypomagnesemia [E83.42]   . Protein-calorie malnutrition (HCC) [E46]   . Hepatitis C antibody test positive [R89.4]   . Elevated LFTs [R79.89]   . Hyperkalemia [E87.5]   . Severe protein-calorie malnutrition (HCC) [E43]   . Acute respiratory failure with hypoxia (HCC) [J96.01] 08/03/2015  . Septic olecranon bursitis of right elbow [M70.21, B96.89] 05/08/2015  . Septic bursitis of elbow [M71.129] 05/07/2015  . Thrombocytopenia (HCC) [D69.6] 05/07/2015  . Chronic hepatitis C without hepatic coma (HCC) [B18.2] 05/07/2015  . Hypokalemia [E87.6] 05/07/2015  . Abscess of bursa of elbow [M71.029] 05/07/2015  . COPD exacerbation (HCC) [J44.1] 03/18/2015  . Hyponatremia [E87.1] 03/18/2015  . Hyperglycemia [R73.9] 03/18/2015  . Alcohol dependence (HCC) [F10.20] 12/07/2014  . Alcohol withdrawal (HCC) [F10.239] 12/07/2014  . Protein-calorie  malnutrition, severe (HCC) [E43] 12/07/2014  . Musculoskeletal chest pain [R07.89] 12/07/2014  . Tobacco abuse [Z72.0] 12/07/2014  . Carbuncle and furuncle [L02.92, L02.93] 12/07/2014  . Macrocytic anemia [D53.9] 12/07/2014  . Chest pain [R07.9] 11/30/2014  . Tachycardia [R00.0] 11/30/2014  . COPD (chronic obstructive pulmonary disease) (HCC) [J44.9] 11/30/2014  . Cellulitis and abscess [L03.90, L02.91] 11/30/2014  . Atypical chest pain [R07.89]   . Dehydration [E86.0]    Total Time spent with patient: 30 minutes  Past Psychiatric History: see admission H and P  Past Medical History:  Past Medical History  Diagnosis Date  . COPD (chronic obstructive pulmonary disease) (HCC)   . Anxiety   . Shortness of breath   . Arthritis   . GERD (gastroesophageal reflux disease)   . Full dentures   . Insomnia   . Alcohol abuse   . Hepatitis C   . Alcohol abuse   . History of MRSA infection   . History of encephalopathy     Past Surgical History  Procedure Laterality Date  . Arm surgery    . Hand surgery  1990    both rt/lt carpal tunnels  . Shoulder surgery      right and left-age [age]  . Orif ulnar / radial shaft fracture  1990    right-got infection-had total 10 surgeies 1990  . Orif ulnar fracture Left 05/23/2014    Procedure: OPEN REDUCTION INTERNAL FIXATION (ORIF) LEFT ULNAR FRACTURE;  Surgeon: Harvie Junior, MD;  Location: [facility];  Service: Orthopedics;  Laterality: Left;  . I&d extremity Right 05/07/2015    Procedure: IRRIGATION AND DEBRIDEMENT  ELBOW ;  Surgeon: Dominica Severin, MD;  Location: [facility];  Service: Orthopedics;  Laterality: Right;  Family History:  Family History  Problem Relation Age of Onset  . Breast cancer Mother    Family Psychiatric  History: see admission H and P Social History:  History  Alcohol Use  . Yes    Comment: 1/5th Vodka every day      History  Drug Use No    Comment: Hx: herion, cocaine 5 yrs ago    Social History    Social History  . Marital Status: Legally Separated    Spouse Name: N/A  . Number of Children: N/A  . Years of Education: N/A   Social History Main Topics  . Smoking status: Current Every Day Smoker -- 1.00 packs/day for 20 years    Types: Cigarettes  . Smokeless tobacco: Never Used  . Alcohol Use: Yes     Comment: 1/5th Vodka every day   . Drug Use: No     Comment: Hx: herion, cocaine 5 yrs ago  . Sexual Activity: Yes    Birth Control/ Protection: Post-menopausal   Other Topics Concern  . None   Social History Narrative   Additional Social History:             Sleep: Fair  Appetite:  Fair  Current Medications: Current Facility-Administered Medications  Medication Dose Route Frequency Provider Last Rate Last Dose  . acetaminophen (TYLENOL) tablet 650 mg  650 mg Oral Q6H PRN Charm RingsJamison Y Lord, NP   650 mg at 11/02/15 0917  . albuterol (PROVENTIL HFA;VENTOLIN HFA) 108 (90 BASE) MCG/ACT inhaler 1-2 puff  1-2 puff Inhalation Q6H PRN Rachael FeeIrving A Lugo, MD   2 puff at 11/03/15 0709  . alum & mag hydroxide-simeth (MAALOX/MYLANTA) 200-200-20 MG/5ML suspension 30 mL  30 mL Oral Q4H PRN Charm RingsJamison Y Lord, NP   30 mL at 11/02/15 0904  . benztropine (COGENTIN) tablet 0.5 mg  0.5 mg Oral BID PRN Rachael FeeIrving A Lugo, MD      . DULoxetine (CYMBALTA) DR capsule 30 mg  30 mg Oral Daily Rachael FeeIrving A Lugo, MD   30 mg at 11/03/15 0755  . feeding supplement (ENSURE ENLIVE) (ENSURE ENLIVE) liquid 237 mL  237 mL Oral BID BM Rachael FeeIrving A Lugo, MD   237 mL at 11/03/15 1404  . gabapentin (NEURONTIN) capsule 100 mg  100 mg Oral TID Charm RingsJamison Y Lord, NP   100 mg at 11/03/15 1120  . guaiFENesin-dextromethorphan (ROBITUSSIN DM) 100-10 MG/5ML syrup 5 mL  5 mL Oral Q4H PRN Worthy FlankIjeoma E Nwaeze, NP   5 mL at 11/03/15 0710  . haloperidol (HALDOL) tablet 1 mg  1 mg Oral Q6H PRN Rachael FeeIrving A Lugo, MD   1 mg at 11/03/15 1120  . haloperidol (HALDOL) tablet 2 mg  2 mg Oral BID Rachael FeeIrving A Lugo, MD   2 mg at 11/03/15 0755  . LORazepam (ATIVAN)  tablet 0.5 mg  0.5 mg Oral Once Rachael FeeIrving A Lugo, MD   0.5 mg at 11/01/15 1152  . LORazepam (ATIVAN) tablet 1 mg  1 mg Oral Q6H PRN Rachael FeeIrving A Lugo, MD   1 mg at 11/03/15 1403  . magnesium hydroxide (MILK OF MAGNESIA) suspension 30 mL  30 mL Oral Daily PRN Charm RingsJamison Y Lord, NP      . multivitamin with minerals tablet 1 tablet  1 tablet Oral Daily Oneta Rackanika N Lewis, NP   1 tablet at 11/03/15 0754  . nicotine (NICODERM CQ - dosed in mg/24 hours) patch 21 mg  21 mg Transdermal Daily Rachael FeeIrving A Lugo, MD   [phone]  mg at 11/03/15 0757  . ondansetron (ZOFRAN-ODT) disintegrating tablet 4 mg  4 mg Oral Q6H PRN Oneta Rack, NP   4 mg at 11/02/15 1617  . thiamine (B-1) injection 100 mg  100 mg Intramuscular Once Oneta Rack, NP   100 mg at 10/27/15 1628  . thiamine (VITAMIN B-1) tablet 100 mg  100 mg Oral Daily Oneta Rack, NP   100 mg at 11/03/15 0755  . traZODone (DESYREL) tablet 50 mg  50 mg Oral QHS PRN Worthy Flank, NP   50 mg at 11/02/15 2116    Lab Results: No results found for this or any previous visit (from the past 48 hour(s)).  Physical Findings: AIMS: Facial and Oral Movements Muscles of Facial Expression: None, normal Lips and Perioral Area: None, normal Jaw: None, normal Tongue: None, normal,Extremity Movements Upper (arms, wrists, hands, fingers): None, normal Lower (legs, knees, ankles, toes): None, normal, Trunk Movements Neck, shoulders, hips: None, normal, Overall Severity Severity of abnormal movements (highest score from questions above): None, normal Incapacitation due to abnormal movements: None, normal Patient's awareness of abnormal movements (rate only patient's report): No Awareness, Dental Status Current problems with teeth and/or dentures?: Yes Does patient usually wear dentures?: Yes  CIWA:  CIWA-Ar Total: 4 COWS:     Musculoskeletal: Strength & Muscle Tone: within normal limits Gait & Station: normal Patient leans: normal  Psychiatric Specialty Exam: Review of  Systems  Constitutional: Positive for malaise/fatigue.  Eyes: Negative.   Respiratory: Negative for cough and shortness of breath.   Cardiovascular: Negative.   Gastrointestinal: Positive for nausea and vomiting.  Genitourinary: Negative.   Musculoskeletal: Negative.   Skin: Negative.   Neurological: Positive for weakness.  Endo/Heme/Allergies: Negative.   Psychiatric/Behavioral: Positive for depression and substance abuse. The patient is nervous/anxious.     Blood pressure 94/64, pulse 89, temperature 98 F (36.7 C), temperature source Oral, resp. rate 17, height 5' (1.524 m), weight 49.442 kg (109 lb), SpO2 100 %.Body mass index is 21.29 kg/(m^2).  General Appearance: Fairly Groomed  Patent attorney::  Fair  Speech:  Clear and Coherent and Pressured  Volume:  Increased  Mood:  Anxious and Irritable  Affect:  Depressed, Flat and Labile  Thought Process:  Coherent and Goal Directed  Orientation:  Full (Time, Place, and Person)  Thought Content:  Hallucinations: None, Rumination and symptoms events worries concerns wanting to go to rehab  Suicidal Thoughts:  No  Homicidal Thoughts:  No  Memory:  Immediate;   Fair Recent;   Fair Remote;   Fair  Judgement:  Fair  Insight:  Present and Shallow  Psychomotor Activity:  Restlessness  Concentration:  Fair  Recall:  [employer] of Knowledge:Fair  Language: Fair  Akathisia:  No  Handed:  Right  AIMS (if indicated):     Assets:  Communication Skills  ADL's:  Intact  Cognition: WNL  Sleep:  Number of Hours: 6    I have reviewed and agree with current treatment 11/03/15  Treatment Plan Summary:  Daily contact with patient to assess and evaluate symptoms and progress in treatment and Medication management Ordered Zofran  Q 6 PRN for nausea, Ginger al PRN Supportive approach/coping skills Alcohol dependence; continue to work a relapse prevention plan Mood instability; continue to work with the Haldol and increase to 2 mg  BID Continue cogentin 0.5 mg BID PRN EPS continue Cymbalta 30 mg daily for depression   Continue Anxiety: will work with the Neurontin/Haldol combination Will  encourage to use breathing techniques to help with the anxiety Will explore residential treatment options  Oneta Rack FNP- [facility] 11/03/2015, 4:45 PM

## 2015-11-03 NOTE — Plan of Care (Signed)
Problem: Diagnosis: Increased Risk For Suicide Attempt Goal: STG-Patient Will Comply With Medication Regime Outcome: Progressing Pt compliant with medication regime     

## 2015-11-03 NOTE — Plan of Care (Signed)
Problem: Diagnosis: Increased Risk For Suicide Attempt Goal: STG-Patient Will Attend All Groups On The Unit Outcome: Progressing Pt attended evening wrap up group     

## 2015-11-03 NOTE — [facility] Group Notes (Signed)
[facility] Group Notes:  (Clinical Social Work)  11/03/2015  10:00-11:00AM  Summary of Progress/Problems:   The main focus of today's process group was to   1)  discuss the importance of adding supports  2)  define health supports versus unhealthy supports  3)  identify the patient's current unhealthy supports and plan how to handle them  4)  Identify the patient's current healthy supports and plan what to add.  An emphasis was placed on using counselor, doctor, therapy groups, 12-step groups, and problem-specific support groups to expand supports.    The patient expressed full comprehension of the concepts presented, and agreed that there is a need to add more supports.  The patient stated that her orange card and the Transitional Care Team, people on [place] and her primary care physician at [facility] are her main supports.  She is very concerned that she get out of the hospital by 1:00pm on Tuesday to get to a 3:30 doctor's appointment because she has missed the last two due to being in the hospital in various locations.  She also was not sure where she will be going to have her follow-up for her psychiatric medications, but thinks it may be [facility].  Type of Therapy:  Process Group with Motivational Interviewing  Participation Level:  Active  Participation Quality:  Monopolizing and Redirectable  Affect:  Blunted  Cognitive:  Appropriate  Insight:  Engaged  Engagement in Therapy:  Engaged  Modes of Intervention:   Education, Teacher, English as a foreign languageupport and Processing, Activity  Ambrose MantleMareida Grossman-Orr, LCSW 11/03/2015

## 2015-11-03 NOTE — Progress Notes (Signed)
D: Patient continues to med seek.  This morning she was running down the hall and smiling.  She then started having a "panic attack" because her nurse was not quick enough with her ativan.  She is requesting ativan early, along with haldol 1 mg for agitation.  She is observed in the day room laughing and joking with her peers. She is intrusive standing next to her peers as they get their medication.  She was also overheard discussing medications.   Her depressive symptoms are a 4 today, along with her hopelessness and anxiety.  Her goal is to "go home."  She denies SI/HI/AVH. Patient has been steady on her feet and has not needed wheelchair/walker. A: Continue to monitor medication management and MD orders.  Safety checks completed every 15 minutes per protocol.  Offer support and encouragement as needed. R: Patient is med seeking and intrusive.

## 2015-11-03 NOTE — [facility] Group Notes (Signed)
[facility] Group Notes:  (Nursing/MHT/Case Management/Adjunct)  Date:  11/03/2015  Time:  0915 am  Type of Therapy:  Psychoeducational Skills  Participation Level:  Minimal  Participation Quality:  Inattentive  Affect:  Blunted  Cognitive:  Lacking  Insight:  Lacking  Engagement in Group:  Limited  Modes of Intervention:  Support  Summary of Progress/Problems: Patient came to group late and did not participate.  Cranford MonBeaudry, Dae Antonucci Evans 11/03/2015, 10:09 AM

## 2015-11-04 MED ORDER — PNEUMOCOCCAL VAC POLYVALENT 25 MCG/0.5ML IJ INJ
0.5000 mL | INJECTION | Freq: Once | INTRAMUSCULAR | Status: AC
  Administered 2015-11-04: 0.5 mL via INTRAMUSCULAR

## 2015-11-04 MED ORDER — INFLUENZA VAC SPLIT QUAD 0.5 ML IM SUSY
0.5000 mL | PREFILLED_SYRINGE | Freq: Once | INTRAMUSCULAR | Status: AC
  Administered 2015-11-04: 0.5 mL via INTRAMUSCULAR
  Filled 2015-11-04: qty 0.5

## 2015-11-04 NOTE — Plan of Care (Signed)
Problem: Alteration in mood & ability to function due to Goal: STG-Patient will comply with prescribed medication regimen (Patient will comply with prescribed medication regimen)  Outcome: Progressing Pt compliant with medication regime     

## 2015-11-04 NOTE — Progress Notes (Deleted)
Patient ID: Rhonda Mitchell, female   DOB: 01-Jan-1961, 54 y.o.   MRN: [id number]

## 2015-11-04 NOTE — Progress Notes (Signed)
Adult Psychoeducational Group Note  Date:  11/04/2015 Time:  8:45 PM  Group Topic/Focus:  Wrap-Up Group:   The focus of this group is to help patients review their daily goal of treatment and discuss progress on daily workbooks.  Participation Level:  Active  Participation Quality:  Appropriate  Affect:  Appropriate  Cognitive:  Appropriate  Insight: Appropriate  Engagement in Group:  Engaged  Modes of Intervention:  Discussion  Additional Comments:  Pt rated overall day a 10 out of 10 because she got her discharge papers and worked her plans out for after she is discharged. Pt reported that her goal for the day was to speak with her CSW and her MD, which she did achieve. Pt reported that the highlight of her day was speaking to her fiance and knowing that he is preparing for her sobriety when she is discharged.   Rhonda Mitchell 11/04/2015, 10:08 PM

## 2015-11-04 NOTE — [facility] Group Notes (Signed)
[facility]   11/04/2015 10:47 AM  Participation Quality:  Appropriate   Mood/Affect:  Appropriate  Depression Rating:  "I'm feeling good today."   Anxiety Rating:  2  Thoughts of Suicide:  No Will you contract for safety?   NA  Current AVH:  No  Plan for Discharge/Comments:  Pt reports that she is ready to face court dates and is hopeful about Thursday appt with [employer] "case worker" that is coming to her house to set her up for transportation services, the orange card, and "hopefully other services like medicaid." Pt is in a good mood this morning and states that she must leave by 2:30PM on Tuesday in order to make her 3:30PM appt.  Transportation Means: "my boyfriend paid for a cab already for tomorrow."   Supports: boyfriend   Smart, Conservation officer, natureHeather LCSW

## 2015-11-04 NOTE — Progress Notes (Signed)
Patient ID: Rhonda Mitchell, female   DOB: 04/16/1961, 54 y.o.   MRN: [id number] D: Patient pacing in hallway reports increase anxiety over discharge tomorrow. Pt stated plans to get a sponsor and apply for disability after discharge. Pt denies SI/HI/AVH and pain. Cooperative with assessment. No physical  distress noted.   A: Met with pt 1:1. Medications administered as prescribed. Support and encouragement provided. Pt encouraged to use coping skill learned.   R: Patient remains safe and complaint with medications.

## 2015-11-04 NOTE — Progress Notes (Signed)
[facility] MD Progress Note  11/04/2015 4:38 PM Rhonda Mitchell  MRN:  [id number] Subjective:  Rhonda Mitchell states that she realizes that if she goes back to drinking she is going to die. That is why she was wanting to go to a residential treatment program from here but knows she has to take care of her charges before she can go to rehab. She states she knows she is facing jail time, but will like to be jailed in a place where she can be provided with treatment. She has a farm two horses ten dogs and she was not taking care of them as she should have just feeding them. She just wanted to drink. Everything was secondary. Does not want to get to that point again. Admits she is afraid Principal Problem: Alcohol-induced mood disorder (HCC) Diagnosis:   Patient Active Problem List   Diagnosis Date Noted  . Severe recurrent major depression without psychotic features (HCC) [F33.2] 10/28/2015  . Alcohol dependence with uncomplicated withdrawal (HCC) [F10.230] 10/27/2015  . Alcohol-induced mood disorder (HCC) [F10.94] 10/27/2015  . Psychosis [F29] 08/21/2015  . Hypomagnesemia [E83.42]   . Protein-calorie malnutrition (HCC) [E46]   . Hepatitis C antibody test positive [R89.4]   . Elevated LFTs [R79.89]   . Hyperkalemia [E87.5]   . Severe protein-calorie malnutrition (HCC) [E43]   . Acute respiratory failure with hypoxia (HCC) [J96.01] 08/03/2015  . Septic olecranon bursitis of right elbow [M70.21, B96.89] 05/08/2015  . Septic bursitis of elbow [M71.129] 05/07/2015  . Thrombocytopenia (HCC) [D69.6] 05/07/2015  . Chronic hepatitis C without hepatic coma (HCC) [B18.2] 05/07/2015  . Hypokalemia [E87.6] 05/07/2015  . Abscess of bursa of elbow [M71.029] 05/07/2015  . COPD exacerbation (HCC) [J44.1] 03/18/2015  . Hyponatremia [E87.1] 03/18/2015  . Hyperglycemia [R73.9] 03/18/2015  . Alcohol dependence (HCC) [F10.20] 12/07/2014  . Alcohol withdrawal (HCC) [F10.239] 12/07/2014  . Protein-calorie malnutrition,  severe (HCC) [E43] 12/07/2014  . Musculoskeletal chest pain [R07.89] 12/07/2014  . Tobacco abuse [Z72.0] 12/07/2014  . Carbuncle and furuncle [L02.92, L02.93] 12/07/2014  . Macrocytic anemia [D53.9] 12/07/2014  . Chest pain [R07.9] 11/30/2014  . Tachycardia [R00.0] 11/30/2014  . COPD (chronic obstructive pulmonary disease) (HCC) [J44.9] 11/30/2014  . Cellulitis and abscess [L03.90, L02.91] 11/30/2014  . Atypical chest pain [R07.89]   . Dehydration [E86.0]    Total Time spent with patient: 30 minutes  Past Psychiatric History: see admission H and P  Past Medical History:  Past Medical History  Diagnosis Date  . COPD (chronic obstructive pulmonary disease) (HCC)   . Anxiety   . Shortness of breath   . Arthritis   . GERD (gastroesophageal reflux disease)   . Full dentures   . Insomnia   . Alcohol abuse   . Hepatitis C   . Alcohol abuse   . History of MRSA infection   . History of encephalopathy     Past Surgical History  Procedure Laterality Date  . Arm surgery    . Hand surgery  1990    both rt/lt carpal tunnels  . Shoulder surgery      right and left-age [age]  . Orif ulnar / radial shaft fracture  1990    right-got infection-had total 10 surgeies 1990  . Orif ulnar fracture Left 05/23/2014    Procedure: OPEN REDUCTION INTERNAL FIXATION (ORIF) LEFT ULNAR FRACTURE;  Surgeon: Harvie Junior, MD;  Location: [facility];  Service: Orthopedics;  Laterality: Left;  . I&d extremity Right 05/07/2015    Procedure: IRRIGATION AND  DEBRIDEMENT  ELBOW ;  Surgeon: Dominica Severin, MD;  Location: [facility];  Service: Orthopedics;  Laterality: Right;   Family History:  Family History  Problem Relation Age of Onset  . Breast cancer Mother    Family Psychiatric  History: see Admission H and P Social History:  History  Alcohol Use  . Yes    Comment: 1/5th Vodka every day      History  Drug Use No    Comment: Hx: herion, cocaine 5 yrs ago    Social History   Social  History  . Marital Status: Legally Separated    Spouse Name: N/A  . Number of Children: N/A  . Years of Education: N/A   Social History Main Topics  . Smoking status: Current Every Day Smoker -- 1.00 packs/day for 20 years    Types: Cigarettes  . Smokeless tobacco: Never Used  . Alcohol Use: Yes     Comment: 1/5th Vodka every day   . Drug Use: No     Comment: Hx: herion, cocaine 5 yrs ago  . Sexual Activity: Yes    Birth Control/ Protection: Post-menopausal   Other Topics Concern  . None   Social History Narrative   Additional Social History:                         Sleep: Fair  Appetite:  Fair  Current Medications: Current Facility-Administered Medications  Medication Dose Route Frequency Provider Last Rate Last Dose  . acetaminophen (TYLENOL) tablet 650 mg  650 mg Oral Q6H PRN Charm Rings, NP   650 mg at 11/03/15 1936  . albuterol (PROVENTIL HFA;VENTOLIN HFA) 108 (90 BASE) MCG/ACT inhaler 1-2 puff  1-2 puff Inhalation Q6H PRN Rachael Fee, MD   2 puff at 11/04/15 1155  . alum & mag hydroxide-simeth (MAALOX/MYLANTA) 200-200-20 MG/5ML suspension 30 mL  30 mL Oral Q4H PRN Charm Rings, NP   30 mL at 11/02/15 0904  . benztropine (COGENTIN) tablet 0.5 mg  0.5 mg Oral BID PRN Rachael Fee, MD      . DULoxetine (CYMBALTA) DR capsule 30 mg  30 mg Oral Daily Rachael Fee, MD   30 mg at 11/04/15 0802  . feeding supplement (ENSURE ENLIVE) (ENSURE ENLIVE) liquid 237 mL  237 mL Oral BID BM Rachael Fee, MD   237 mL at 11/04/15 1511  . gabapentin (NEURONTIN) capsule 100 mg  100 mg Oral TID Charm Rings, NP   100 mg at 11/04/15 1635  . guaiFENesin-dextromethorphan (ROBITUSSIN DM) 100-10 MG/5ML syrup 5 mL  5 mL Oral Q4H PRN Worthy Flank, NP   5 mL at 11/04/15 1603  . haloperidol (HALDOL) tablet 1 mg  1 mg Oral Q6H PRN Rachael Fee, MD   1 mg at 11/03/15 1610  . haloperidol (HALDOL) tablet 2 mg  2 mg Oral BID Rachael Fee, MD   2 mg at 11/04/15 1635  . LORazepam  (ATIVAN) tablet 0.5 mg  0.5 mg Oral Once Rachael Fee, MD   0.5 mg at 11/01/15 1152  . LORazepam (ATIVAN) tablet 1 mg  1 mg Oral Q6H PRN Rachael Fee, MD   1 mg at 11/04/15 1413  . magnesium hydroxide (MILK OF MAGNESIA) suspension 30 mL  30 mL Oral Daily PRN Charm Rings, NP      . multivitamin with minerals tablet 1 tablet  1 tablet Oral Daily Oneta Rack,  NP   1 tablet at 11/04/15 0802  . nicotine (NICODERM CQ - dosed in mg/24 hours) patch 21 mg  21 mg Transdermal Daily Rachael FeeIrving A Dorthea Maina, MD   21 mg at 11/04/15 0803  . ondansetron (ZOFRAN-ODT) disintegrating tablet 4 mg  4 mg Oral Q6H PRN Oneta Rackanika N Lewis, NP   4 mg at 11/02/15 1617  . thiamine (B-1) injection 100 mg  100 mg Intramuscular Once Oneta Rackanika N Lewis, NP   100 mg at 10/27/15 1628  . thiamine (VITAMIN B-1) tablet 100 mg  100 mg Oral Daily Oneta Rackanika N Lewis, NP   100 mg at 11/04/15 0802  . traZODone (DESYREL) tablet 50 mg  50 mg Oral QHS PRN Worthy FlankIjeoma E Nwaeze, NP   50 mg at 11/03/15 2116    Lab Results: No results found for this or any previous visit (from the past 48 hour(s)).  Physical Findings: AIMS: Facial and Oral Movements Muscles of Facial Expression: None, normal Lips and Perioral Area: None, normal Jaw: None, normal Tongue: None, normal,Extremity Movements Upper (arms, wrists, hands, fingers): None, normal Lower (legs, knees, ankles, toes): None, normal, Trunk Movements Neck, shoulders, hips: None, normal, Overall Severity Severity of abnormal movements (highest score from questions above): None, normal Incapacitation due to abnormal movements: None, normal Patient's awareness of abnormal movements (rate only patient's report): No Awareness, Dental Status Current problems with teeth and/or dentures?: Yes Does patient usually wear dentures?: Yes  CIWA:  CIWA-Ar Total: 4 COWS:     Musculoskeletal: Strength & Muscle Tone: within normal limits Gait & Station: normal Patient leans: normal  Psychiatric Specialty  Exam: Review of Systems  Constitutional: Negative.   HENT: Negative.   Eyes: Negative.   Respiratory: Negative.   Cardiovascular: Negative.   Gastrointestinal: Negative.   Genitourinary: Negative.   Musculoskeletal: Negative.   Skin: Negative.   Endo/Heme/Allergies: Negative.   Psychiatric/Behavioral: Positive for depression and substance abuse. The patient is nervous/anxious.     Blood pressure 98/77, pulse 82, temperature 97.9 F (36.6 C), temperature source Oral, resp. rate 18, height 5' (1.524 m), weight 49.442 kg (109 lb), SpO2 100 %.Body mass index is 21.29 kg/(m^2).  General Appearance: Fairly Groomed  Patent attorneyye Contact::  Fair  Speech:  Clear and Coherent  Volume:  fluctuates  Mood:  Anxious and worried regretful  Affect:  Labile and Tearful  Thought Process:  Coherent and Goal Directed  Orientation:  Full (Time, Place, and Person)  Thought Content:  symptoms events worries concerns  Suicidal Thoughts:  No  Homicidal Thoughts:  No  Memory:  Immediate;   Fair Recent;   Fair Remote;   Fair  Judgement:  Fair  Insight:  Present and Shallow  Psychomotor Activity:  Restlessness  Concentration:  Fair  Recall:  FiservFair  Fund of Knowledge:Fair  Language: Fair  Akathisia:  No  Handed:  Right  AIMS (if indicated):     Assets:  Desire for Improvement Housing Social Support  ADL's:  Intact  Cognition: WNL  Sleep:  Number of Hours: 6.75   Treatment Plan Summary: Daily contact with patient to assess and evaluate symptoms and progress in treatment and Medication management Supportive approach/coping skills Alcohol dependence: continue to work a relapse prevention plan Mood instability; continue the Haldol and the Neurontin Depression;continue the Cymbalta  Use CBT/mindfulness Continue to explore residential treatment options  Ariell Gunnels A 11/04/2015, 4:38 PM

## 2015-11-04 NOTE — Progress Notes (Signed)
D: Patient alert and oriented x 4. Patient denies pain/SI/HI/AVH. Patient reported SOB and requested Ventolin inhaler and Robitussin DM  at 2032, PRN medications was given with effective results. Patient then requested ativan for anxiety and trazodone to help her sleep at 2116. PRN's was given with effective results. Will continue to monitor.  A: Staff to monitor Q 15 mins for safety. Encouragement and support offered. Scheduled medications administered per orders. R: Patient remains safe on the unit. Patient attended group tonight. Patient visible on hte unit and interacting with peers. Patient taking administered medications

## 2015-11-04 NOTE — Progress Notes (Signed)
Patient ID: Rhonda GillesMichelle Mitchell, female   DOB: 02-14-1961, 54 y.o.   MRN: [id number] D-  Patient is active pacing the halls unable to sit still in remain in groups.  Patient had no complaints of SI/HI or AVH this shift.  Patient took all medications this shift and requested a flu and pneumonia vaccine.  Patient denies any thoughts of harm to self and has had no incidents of behavioral dyscontrol.  A- assess patient for safety, offer medications as prescribed, encourage patient to attend groups. Praise patient for using positive coping skills to alleviate anxiety.    R-  Patient was able to contract for safety.  Patient maintained safety throughout the shift.  Patient was a calm and compliant with therapy this shift.

## 2015-11-04 NOTE — [facility] Group Notes (Signed)
[facility] LCSW Group Therapy  11/04/2015 1:11 PM  Type of Therapy:  Group Therapy  Participation Level:  Active  Participation Quality:  Attentive  Affect:  Appropriate  Cognitive:  Alert and Oriented  Insight:  Improving  Engagement in Therapy:  Engaged  Modes of Intervention:  Confrontation, Discussion, Education, Exploration, Problem-solving, Rapport Building, Socialization and Support  Summary of Progress/Problems: Today's Topic: Overcoming Obstacles. Patients identified one short term goal and potential obstacles in reaching this goal. Patients processed barriers involved in overcoming these obstacles. Patients identified steps necessary for overcoming these obstacles and explored motivation (internal and external) for facing these difficulties head on. Rhonda Mitchell was attentive and engaged during today's processing group. She reports that her obstacle is "my court dates getting in the way of being able to get into treatment." Rhonda Mitchell states that she has accepted that she has to d/c on Tuesday and follow up with her caseworker on Thursday. Rhonda Mitchell continues to demonstrate improving insight and progress in the group setting.    Smart, Rees Santistevan LCSW 11/04/2015, 1:11 PM

## 2015-11-05 MED ORDER — GABAPENTIN 100 MG PO CAPS: 100.0000 mg | ORAL_CAPSULE | Freq: Three times a day (TID) | ORAL | Status: DC

## 2015-11-05 MED ORDER — TRAZODONE HCL 50 MG PO TABS: 50.0000 mg | ORAL_TABLET | Freq: Every evening | ORAL | Status: DC | PRN

## 2015-11-05 MED ORDER — BENZTROPINE MESYLATE 0.5 MG PO TABS: 0.5000 mg | ORAL_TABLET | Freq: Two times a day (BID) | ORAL | Status: DC | PRN

## 2015-11-05 MED ORDER — NICOTINE 21 MG/24HR TD PT24: 21.0000 mg | MEDICATED_PATCH | Freq: Every day | TRANSDERMAL | Status: DC

## 2015-11-05 MED ORDER — DULOXETINE HCL 30 MG PO CPEP: 30.0000 mg | ORAL_CAPSULE | Freq: Every day | ORAL | Status: DC

## 2015-11-05 MED ORDER — HALOPERIDOL 2 MG PO TABS: 2.0000 mg | ORAL_TABLET | Freq: Two times a day (BID) | ORAL | Status: DC

## 2015-11-05 MED ORDER — THIAMINE HCL 100 MG PO TABS: 100.0000 mg | ORAL_TABLET | Freq: Every day | ORAL | Status: DC

## 2015-11-05 NOTE — [facility] Suicide Risk Assessment (Signed)
[facility] Discharge Suicide Risk Assessment   Demographic Factors:  Caucasian  Total Time spent with patient: 30 minutes  Musculoskeletal: Strength & Muscle Tone: within normal limits Gait & Station: normal Patient leans: normal  Psychiatric Specialty Exam: Physical Exam  Review of Systems  Constitutional: Negative.   HENT: Negative.   Eyes: Negative.   Respiratory: Negative.   Cardiovascular: Negative.   Gastrointestinal: Negative.   Genitourinary: Negative.   Musculoskeletal: Negative.   Skin: Negative.   Neurological: Negative.   Endo/Heme/Allergies: Negative.   Psychiatric/Behavioral: Positive for substance abuse. The patient is nervous/anxious.     Blood pressure 106/72, pulse 80, temperature 98.5 F (36.9 C), temperature source Oral, resp. rate 20, height 5' (1.524 m), weight 49.442 kg (109 lb), SpO2 100 %.Body mass index is 21.29 kg/(m^2).  General Appearance: Fairly Groomed  Patent attorney::  Fair  Speech:  Clear and Coherent409  Volume:  Normal  Mood:  Euthymic  Affect:  Appropriate  Thought Process:  Coherent and Goal Directed  Orientation:  Full (Time, Place, and Person)  Thought Content:  plans as she moves on, relapse prevention plan  Suicidal Thoughts:  No  Homicidal Thoughts:  No  Memory:  Immediate;   Fair Recent;   Fair Remote;   Fair  Judgement:  Fair  Insight:  Present  Psychomotor Activity:  Restlessness  Concentration:  Fair  Recall:  [employer] of Knowledge:Fair  Language: Fair  Akathisia:  No  Handed:  Right  AIMS (if indicated):     Assets:  Desire for Improvement Housing Social Support  Sleep:  Number of Hours: 6.75  Cognition: WNL  ADL's:  Intact   Have you used any form of tobacco in the last 30 days? (Cigarettes, Smokeless Tobacco, Cigars, and/or Pipes): Yes  Has this patient used any form of tobacco in the last 30 days? (Cigarettes, Smokeless Tobacco, Cigars, and/or Pipes) Yes, A prescription for an FDA-approved tobacco cessation  medication was offered at discharge and the patient refused  Mental Status Per Nursing Assessment::   On Admission:  Suicidal ideation indicated by patient, Self-harm thoughts  Current Mental Status by Physician: In full contact with reality. There are no active SI plans or intent. There are no active S/S of withdrawal. She is going to be picked up by her BF and taken to her PCP and her lawyer. They are trying to get things in order so she can take care of the legal stuff so she can go to a residential treatment program. She states she is committed to make this work for her as she does not want to relapse.    Loss Factors: Legal issues  Historical Factors: NA  Risk Reduction Factors:   Sense of responsibility to family, Living with another person, especially a relative and Positive social support  Continued Clinical Symptoms:  Depression:   Comorbid alcohol abuse/dependence Impulsivity Alcohol/Substance Abuse/Dependencies  Cognitive Features That Contribute To Risk:  Closed-mindedness, Polarized thinking and Thought constriction (tunnel vision)    Suicide Risk:  Minimal: No identifiable suicidal ideation.  Patients presenting with no risk factors but with morbid ruminations; may be classified as minimal risk based on the severity of the depressive symptoms  Principal Problem: Alcohol-induced mood disorder [facility]) Discharge Diagnoses:  Patient Active Problem List   Diagnosis Date Noted  . Severe recurrent major depression without psychotic features (HCC) [F33.2] 10/28/2015  . Alcohol dependence with uncomplicated withdrawal (HCC) [F10.230] 10/27/2015  . Alcohol-induced mood disorder (HCC) [F10.94] 10/27/2015  . Psychosis [F29]  08/21/2015  . Hypomagnesemia [E83.42]   . Protein-calorie malnutrition (HCC) [E46]   . Hepatitis C antibody test positive [R89.4]   . Elevated LFTs [R79.89]   . Hyperkalemia [E87.5]   . Severe protein-calorie malnutrition (HCC) [E43]   . Acute respiratory  failure with hypoxia (HCC) [J96.01] 08/03/2015  . Septic olecranon bursitis of right elbow [M70.21, B96.89] 05/08/2015  . Septic bursitis of elbow [M71.129] 05/07/2015  . Thrombocytopenia (HCC) [D69.6] 05/07/2015  . Chronic hepatitis C without hepatic coma (HCC) [B18.2] 05/07/2015  . Hypokalemia [E87.6] 05/07/2015  . Abscess of bursa of elbow [M71.029] 05/07/2015  . COPD exacerbation (HCC) [J44.1] 03/18/2015  . Hyponatremia [E87.1] 03/18/2015  . Hyperglycemia [R73.9] 03/18/2015  . Alcohol dependence (HCC) [F10.20] 12/07/2014  . Alcohol withdrawal (HCC) [F10.239] 12/07/2014  . Protein-calorie malnutrition, severe (HCC) [E43] 12/07/2014  . Musculoskeletal chest pain [R07.89] 12/07/2014  . Tobacco abuse [Z72.0] 12/07/2014  . Carbuncle and furuncle [L02.92, L02.93] 12/07/2014  . Macrocytic anemia [D53.9] 12/07/2014  . Chest pain [R07.9] 11/30/2014  . Tachycardia [R00.0] 11/30/2014  . COPD (chronic obstructive pulmonary disease) (HCC) [J44.9] 11/30/2014  . Cellulitis and abscess [L03.90, L02.91] 11/30/2014  . Atypical chest pain [R07.89]   . Dehydration [E86.0]     Follow-up Information    Follow up with [facility].   Why:  Walk in between 8am-9am Monday through Friday for hospital follow-up/medication management/assessment for therapy services.    Contact information:   [address] [place], [place] [address] Phone: [phone] Fax: [phone]      [facility].   Why:  3:30pm appointment   Contact information:   [facility] [address] [place] [address] Phone: [phone]      Plan Of Care/Follow-up recommendations:  Activity:  as tolerated Diet:  regular Follow up as above Is patient on multiple antipsychotic therapies at discharge:  No   Has Patient had three or more failed trials of antipsychotic monotherapy by history:  No  Recommended Plan for Multiple Antipsychotic  Therapies: NA    Blaiden Werth A 11/05/2015, 11:32 AM

## 2015-11-05 NOTE — Progress Notes (Signed)
Patient ID: Rhonda Mitchell, female   DOB: 23-Nov-1961, 54 y.o.   MRN: [id number]  Pt. Denies SI/HI and A/V hallucinations. Belongings returned to patient at time of discharge. Patient denies any new onset of pain or discomfort. Discharge instructions and medications were reviewed with patient. Patient verbalized understanding of both medications and discharge instructions. Patient discharged and got into a taxi with her belongings. Patient informed to take off nicotine patch before smoking if she decided to smoke again. Q15 minute safety checks maintained until discharge. No distress upon discharge.

## 2015-11-05 NOTE — Discharge Summary (Signed)
Physician Discharge Summary Note  Patient:  Rhonda Mitchell is an 54 y.o., female MRN:  [id number] DOB:  09-22-1961 Patient phone:  [phone] (home)  Patient address:   [address] [place] [address],  Total Time spent with patient: 45 minutes  Date of Admission:  10/27/2015 Date of Discharge: 11/05/2015  Reason for Admission:   This was the [facility] admission to [facility] for this 54 Y/O female who states she fell yesterday while at the unit ( unwitnessed) and she is having pain. Has a headache and neck pain. She initially came to the ED requesting detox. She has been drinking every day. She admits she used to use drugs but now she just drinks. Claims she is drinking 4 pints a day. She is under a lot of stress facing several DWI charges while being on probation for stealing at a [employer]. States she really needs help. She wants to go to a residential treatment program from here.  The initial assessment was as follows: Rhonda GillesMichelle Mitchell is an 54 y.o. female who presents to [facility] voluntarily via EMS for detox. Patient presented with shortness of breath and wheezing upon arrival. Respiratory was consulted. Patient was later assessed and states that she would like detox. She states that [facility] and was "turned down because I don't have insurance, but they are getting me in to see somebody." Patient states that she does not have any planned appointments for psychiatry or therapy at this time but "they're working on it." Patient states that she drinks four pints of Liquor per day and tried to stop yesterday. Patient states that she kept the alcohol beside her bed and slowly drank it to prevent symptoms of withdrawal and decided to come to the hospital for detox. Patient states that she has drank four pints of alcohol daily for the past year. Patient states that she used cocaine and heroin in the past but has not used in eight years. Patient states that she "substitutes the  alcohol" for the other drugs that she used in the past. Patient denies SI currently but states that she really wants to stop drinking and is afraid that she will become suicidal if released and she has to detox without being monitored. Patient states that two of her brothers committed suicide four months ago and she thought about "stabbing myself in the jugular and bleeding out." Patient states that she was afraid and did not follow through with this plan. Patient states that ten years ago she attempted suicide by cutting her arm. Patient denies other attempts/gestures. Patient denies HI and history of being violent towards others. Patient states that she has 4 pending charges for DUI's and her court dates start on November 05, 2015. Patient states that she is also on probation for "stealing from [employer]." Patient denies access to firearms or weapons. Patient denies AVH but states that sometimes she hears her brothers' voices telling her to "do better." Patient does not appear to be responding to internal stimuli. Patient BAL 258 and UDS clear at this time,   Principal Problem: Alcohol-induced mood disorder [facility](HCC) Discharge Diagnoses: Patient Active Problem List   Diagnosis Date Noted  . Severe recurrent major depression without psychotic features (HCC) [F33.2] 10/28/2015  . Alcohol dependence with uncomplicated withdrawal (HCC) [F10.230] 10/27/2015  . Alcohol-induced mood disorder (HCC) [F10.94] 10/27/2015  . Psychosis [F29] 08/21/2015  . Hypomagnesemia [E83.42]   . Protein-calorie malnutrition (HCC) [E46]   . Hepatitis C antibody test positive [R89.4]   .  Elevated LFTs [R79.89]   . Hyperkalemia [E87.5]   . Severe protein-calorie malnutrition (HCC) [E43]   . Acute respiratory failure with hypoxia (HCC) [J96.01] 08/03/2015  . Septic olecranon bursitis of right elbow [M70.21, B96.89] 05/08/2015  . Septic bursitis of elbow [M71.129] 05/07/2015  . Thrombocytopenia (HCC) [D69.6] 05/07/2015  . Chronic  hepatitis C without hepatic coma (HCC) [B18.2] 05/07/2015  . Hypokalemia [E87.6] 05/07/2015  . Abscess of bursa of elbow [M71.029] 05/07/2015  . COPD exacerbation (HCC) [J44.1] 03/18/2015  . Hyponatremia [E87.1] 03/18/2015  . Hyperglycemia [R73.9] 03/18/2015  . Alcohol dependence (HCC) [F10.20] 12/07/2014  . Alcohol withdrawal (HCC) [F10.239] 12/07/2014  . Protein-calorie malnutrition, severe (HCC) [E43] 12/07/2014  . Musculoskeletal chest pain [R07.89] 12/07/2014  . Tobacco abuse [Z72.0] 12/07/2014  . Carbuncle and furuncle [L02.92, L02.93] 12/07/2014  . Macrocytic anemia [D53.9] 12/07/2014  . Chest pain [R07.9] 11/30/2014  . Tachycardia [R00.0] 11/30/2014  . COPD (chronic obstructive pulmonary disease) (HCC) [J44.9] 11/30/2014  . Cellulitis and abscess [L03.90, L02.91] 11/30/2014  . Atypical chest pain [R07.89]   . Dehydration [E86.0]     Past Medical History:  Past Medical History  Diagnosis Date  . COPD (chronic obstructive pulmonary disease) (HCC)   . Anxiety   . Shortness of breath   . Arthritis   . GERD (gastroesophageal reflux disease)   . Full dentures   . Insomnia   . Alcohol abuse   . Hepatitis C   . Alcohol abuse   . History of MRSA infection   . History of encephalopathy     Past Surgical History  Procedure Laterality Date  . Arm surgery    . Hand surgery  1990    both rt/lt carpal tunnels  . Shoulder surgery      right and left-age [age]  . Orif ulnar / radial shaft fracture  1990    right-got infection-had total 10 surgeies 1990  . Orif ulnar fracture Left 05/23/2014    Procedure: OPEN REDUCTION INTERNAL FIXATION (ORIF) LEFT ULNAR FRACTURE;  Surgeon: Harvie Junior, MD;  Location: [facility];  Service: Orthopedics;  Laterality: Left;  . I&d extremity Right 05/07/2015    Procedure: IRRIGATION AND DEBRIDEMENT  ELBOW ;  Surgeon: Dominica Severin, MD;  Location: [facility] OR;  Service: Orthopedics;  Laterality: Right;   Family History:  Family History   Problem Relation Age of Onset  . Breast cancer Mother    Social History:  History  Alcohol Use  . Yes    Comment: 1/5th Vodka every day      History  Drug Use No    Comment: Hx: herion, cocaine 5 yrs ago    Social History   Social History  . Marital Status: Legally Separated    Spouse Name: N/A  . Number of Children: N/A  . Years of Education: N/A   Social History Main Topics  . Smoking status: Current Every Day Smoker -- 1.00 packs/day for 20 years    Types: Cigarettes  . Smokeless tobacco: Never Used  . Alcohol Use: Yes     Comment: 1/5th Vodka every day   . Drug Use: No     Comment: Hx: herion, cocaine 5 yrs ago  . Sexual Activity: Yes    Birth Control/ Protection: Post-menopausal   Other Topics Concern  . None   Social History Narrative    Hospital Course:   Hampton Cost was admitted for Alcohol-induced mood disorder (HCC), and crisis management.  Pt was treated discharged with the  medications listed below under Medication List.  Medical problems were identified and treated as needed.  Home medications were restarted as appropriate.  Improvement was monitored by observation and Rhonda Mitchell 's daily report of symptom reduction.  Emotional and mental status was monitored by daily self-inventory reports completed by Rhonda Mitchell and clinical staff.         Rhonda Mitchell was evaluated by the treatment team for stability and plans for continued recovery upon discharge. Rhonda Mitchell 's motivation was an integral factor for scheduling further treatment. Employment, transportation, bed availability, health status, family support, and any pending legal issues were also considered during hospital stay. Pt was offered further treatment options upon discharge including but not limited to Residential, Intensive Outpatient, and Outpatient treatment.  Layali Freund will follow up with the services as listed below under Follow Up Information.     Upon  completion of this admission the patient was both mentally and medically stable for discharge denying suicidal/homicidal ideation, auditory/visual/tactile hallucinations, delusional thoughts and paranoia.    Physical Findings: AIMS: Facial and Oral Movements Muscles of Facial Expression: None, normal Lips and Perioral Area: None, normal Jaw: None, normal Tongue: None, normal,Extremity Movements Upper (arms, wrists, hands, fingers): None, normal Lower (legs, knees, ankles, toes): None, normal, Trunk Movements Neck, shoulders, hips: None, normal, Overall Severity Severity of abnormal movements (highest score from questions above): None, normal Incapacitation due to abnormal movements: None, normal Patient's awareness of abnormal movements (rate only patient's report): No Awareness, Dental Status Current problems with teeth and/or dentures?: Yes Does patient usually wear dentures?: Yes  CIWA:  CIWA-Ar Total: 4 COWS:     Musculoskeletal: Strength & Muscle Tone: within normal limits Gait & Station: normal Patient leans: N/A  Psychiatric Specialty Exam: Review of Systems  Psychiatric/Behavioral: Positive for depression and substance abuse. Negative for suicidal ideas and hallucinations. The patient is nervous/anxious and has insomnia.   All other systems reviewed and are negative.   Blood pressure 106/72, pulse 80, temperature 98.5 F (36.9 C), temperature source Oral, resp. rate 20, height 5' (1.524 m), weight 49.442 kg (109 lb), SpO2 100 %.Body mass index is 21.29 kg/(m^2).  SEE MD PSE within the SRA   Have you used any form of tobacco in the last 30 days? (Cigarettes, Smokeless Tobacco, Cigars, and/or Pipes): Yes  Has this patient used any form of tobacco in the last 30 days? (Cigarettes, Smokeless Tobacco, Cigars, and/or Pipes) Yes, A prescription for an FDA-approved tobacco cessation medication was offered at discharge and the patient accepted.   Metabolic Disorder Labs:  Lab  Results  Component Value Date   HGBA1C 5.6 08/04/2015   MPG 114 08/04/2015   MPG 111 03/19/2015   No results found for: PROLACTIN No results found for: CHOL, TRIG, HDL, CHOLHDL, VLDL, LDLCALC  See Psychiatric Specialty Exam and Suicide Risk Assessment completed by Attending Physician prior to discharge.  Discharge destination:  Home  Is patient on multiple antipsychotic therapies at discharge:  No   Has Patient had three or more failed trials of antipsychotic monotherapy by history:  No  Recommended Plan for Multiple Antipsychotic Therapies: NA     Medication List    STOP taking these medications        budesonide-formoterol 80-4.5 MCG/ACT inhaler  Commonly known as:  SYMBICORT     CALCIUM PO     diphenhydrAMINE 25 MG tablet  Commonly known as:  BENADRYL     ibuprofen 200 MG tablet  Commonly known as:  ADVIL,MOTRIN  ICY HOT EXTRA STRENGTH 10-30 % Crea     multivitamin with minerals Tabs tablet     pantoprazole 40 MG tablet  Commonly known as:  PROTONIX     POTASSIUM PO     tiotropium 18 MCG inhalation capsule  Commonly known as:  SPIRIVA      TAKE these medications      Indication   albuterol 108 (90 BASE) MCG/ACT inhaler  Commonly known as:  PROVENTIL HFA;VENTOLIN HFA  Inhale 2 puffs into the lungs every 6 (six) hours as needed for wheezing or shortness of breath.      benztropine 0.5 MG tablet  Commonly known as:  COGENTIN  Take 1 tablet (0.5 mg total) by mouth 2 (two) times daily as needed for tremors (EPS).   Indication:  Extrapyramidal Reaction caused by Medications     DULoxetine 30 MG capsule  Commonly known as:  CYMBALTA  Take 1 capsule (30 mg total) by mouth daily.   Indication:  Major Depressive Disorder     gabapentin 100 MG capsule  Commonly known as:  NEURONTIN  Take 1 capsule (100 mg total) by mouth 3 (three) times daily.   Indication:  mood stabilization     haloperidol 2 MG tablet  Commonly known as:  HALDOL  Take 1 tablet (2  mg total) by mouth 2 (two) times daily.   Indication:  Psychosis     nicotine 21 mg/24hr patch  Commonly known as:  NICODERM CQ - dosed in mg/24 hours  Place 1 patch (21 mg total) onto the skin daily.   Indication:  Nicotine Addiction     thiamine 100 MG tablet  Take 1 tablet (100 mg total) by mouth daily.   Indication:  Deficiency in Thiamine or Vitamin B1     traZODone 50 MG tablet  Commonly known as:  DESYREL  Take 1 tablet (50 mg total) by mouth at bedtime as needed for sleep.   Indication:  Trouble Sleeping           Follow-up Information    Follow up with [facility].   Why:  Walk in between 8am-9am Monday through Friday for hospital follow-up/medication management/assessment for therapy services.    Contact information:   [address], [place] [address] Phone: [phone] Fax: [phone]      Follow up with [facility] On 11/05/2015.   Why:  3:30pm appointment   Contact information:   [facility] @ [facility] [address] [place] [address] Phone: [phone]      Follow-up recommendations:  Activity:  As tolerated Diet:  Heart healthy with low sodium.  Comments:   Take all medications as prescribed. Keep all follow-up appointments as scheduled.  Do not consume alcohol or use illegal drugs while on prescription medications. Report any adverse effects from your medications to your primary care provider promptly.  In the event of recurrent symptoms or worsening symptoms, call 911, a crisis hotline, or go to the nearest emergency department for evaluation.   Signed: Beau Fanny, FNP-BC 11/05/2015, 9:46 AM  I personally assessed the patient and formulated the plan Madie Reno A. Dub Mikes, M.D.

## 2015-11-05 NOTE — Tx Team (Signed)
Interdisciplinary Treatment Plan Update (Adult)  Date:  11/05/2015  Time Reviewed:  9:47 AM   Progress in Treatment: Attending groups: Yes Participating in groups:  Yes Taking medication as prescribed:  Yes. Tolerating medication:  Yes. Family/Significant othe contact made:  SPE completed with pt's boyfriend.  Patient understands diagnosis:  Yes. and As evidenced by:  seeking treatment for alcohol abuse, depression, Passive Si, and for medication stabilization Discussing patient identified problems/goals with staff:  Yes. Medical problems stabilized or resolved:  Yes. Denies suicidal/homicidal ideation: Yes. Issues/concerns per patient self-inventory:  Other:  Discharge Plan or Barriers: Pt denied at [facility]  (no beds). Pt has court date that she MUST show up to on Nov 30th per her attorney. Pt plans to follow-up at [facility] and has PCP appt today at 3:30PM at [facility]. Pt given AA/NA list for [facility] information.   Reason for Continuation of Hospitalization: none  Comments:  Rhonda Mitchell is an 54 y.o. female who presents to [facility] voluntarily via EMS for detox. Patient presented with shortness of breath and wheezing upon arrival. Respiratory was consulted. Patient was later assessed and states that she would like detox. She states that she recently went to [facility] and was "turned down because I don't have insurance, but they are getting me in to see somebody." Patient states that she does not have any planned appointments for psychiatry or therapy at this time but "they're working on it." Patient states that she drinks four pints of Liquor per day and tried to stop yesterday. Patient states that she kept the alcohol beside her bed and slowly drank it to prevent symptoms of withdrawal and decided to come to the hospital for detox. Patient states that she has drank four pints of alcohol daily for the past year. Patient states that she used cocaine  and heroin in the past but has not used in eight years. Patient states that she "substitutes the alcohol" for the other drugs that she used in the past. Patient denies SI currently but states that she really wants to stop drinking and is afraid that she will become suicidal if released and she has to detox without being monitored. Patient states that two of her brothers committed suicide four months ago and she thought about "stabbing myself in the jugular and bleeding out." Patient states that she was afraid and did not follow through with this plan. Patient states that ten years ago she attempted suicide by cutting her arm. Patient denies other attempts/gestures. Patient denies HI and history of being violent towards others. Patient states that she has 4 pending charges for DUI's and her court dates start on November 05, 2015. Patient states that she is also on probation for "stealing from [employer]." Patient denies access to firearms or weapons. Patient denies AVH but states that sometimes she hears her brothers' voices telling her to "do better." Patient does not appear to be responding to internal stimuli. Patient BAL 258 and UDS clear at this time, Patient is alert and oriented x4. Patient talks loudly and appears angry. Patient continues to request medicine to stop the withdrawal symptoms. Patients sitter was informed. Patient is currently in her bed under a blanket with oxygen and IV. Patient states that oxygen makes her thirsty and requested more to drink. Patients sitter informed of patients request. Patient appeared partially impaired due to BAL of 258 upon arrival and being tangential at times. Patient made poor eye contact and her hands were shaking during  the assessment. Patient states that she was in "[facility]" for two months earlier this year and states that she is unable to remember other inpatient treatments at this time. Patient states that she generally sleeps about 2 hours per night but has not been  asleep or eaten in 4 days. Patient states that she is hungry at this time. Diagnosis: 303.90 Alcohol use disorder, Severe  Estimated length of stay:  D/c today   Additional Comments:  Patient and CSW reviewed pt's identified goals and treatment plan. Patient verbalized understanding and agreed to treatment plan. CSW reviewed [facility] "Discharge Process and Patient Involvement" Form. Pt verbalized understanding of information provided and signed form.    Review of initial/current patient goals per problem list:  1. Goal(s): Patient will participate in aftercare plan  Met: Yes  Target date: at discharge  As evidenced by: Patient will participate within aftercare plan AEB aftercare provider and housing plan at discharge being identified.  11/7: CSW assessing for appropriate referrals.   11/11: [facility] referral pending.   11/15: Pt plans to return home; follow-up with PCP for med management and [facility] for mental health services.   2. Goal (s): Patient will exhibit decreased depressive symptoms and suicidal ideations.  Met: Yes.     Target date: at discharge  As evidenced by: Patient will utilize self rating of depression at 3 or below and demonstrate decreased signs of depression or be deemed stable for discharge by MD.  11/7: Pt rates depression as high. Denies SI/HI/AVH.   11/11: Pt rates depression as moderate. Denies SI/HI/AVH. Pt animated and excitable but easily redirected.   11/15: Pt rates depression as 2/10 and presents with pleasant mood and calm affect. Denies SI/HI/AVH.  4. Goal(s): Patient will demonstrate decreased signs of withdrawal due to substance abuse  Met:Yes  Target date:at discharge   As evidenced by: Patient will produce a CIWA/COWS score of 0, have stable vitals signs, and no symptoms of withdrawal.  11/7: Pt reports severe withdrawal symptoms with CIWA score of 11 and high sitting/standing BP.   11/11: Pt denies withdrawals today "other than some  anxiety but that's always there." CIWA score of 3. Goal progressing.     11/15: Pt denies withdrawals today with no CIWA score and stable vitals. Goal met.   Attendees: Patient:   11/05/2015 9:47 AM   Family:   11/05/2015 9:47 AM   Physician:  Dr. Carlton Adam, MD 11/05/2015 9:47 AM   Nursing:  Trinna Post RN 11/05/2015 9:47 AM   Clinical Social Worker: Maxie Better, [place]  11/05/2015 9:47 AM   Clinical Social Worker: Erasmo Downer Drinkard LCSWA; Peri Maris LCSWA 11/05/2015 9:47 AM   Other:   11/05/2015 9:47 AM   Other:  Lars Pinks, [facility] Case Manager  11/05/2015 9:47 AM   Other:   11/05/2015 9:47 AM   Other:  11/05/2015 9:47 AM   Other:  11/05/2015 9:47 AM   Other:  11/05/2015 9:47 AM    11/05/2015 9:47 AM    11/05/2015 9:47 AM    11/05/2015 9:47 AM    11/05/2015 9:47 AM    Scribe for Treatment Team:   Maxie Better, LCSW 11/05/2015 9:47 AM

## 2015-11-05 NOTE — [facility] Group Notes (Signed)
[facility] Group Notes: [employer] Nursing Student Class Stress Management Group  Patient attended the group and was engaged. [employer] Nursing instructor attended group as well The topic of group was stress management. Patient also received daily workbook which topic is Recovery. 

## 2015-11-05 NOTE — Progress Notes (Signed)
  [facility] Discharge Plan :  Will you be returning to the same living situation after discharge:  Yes,  home with boyfriend At discharge, do you have transportation home?: Yes,  taxi-pt's boyfriend arranged for taxi to pick her up and take her direclty to PCP appt. PT MUST D/C NO LATER THAN 2:30PM Do you have the ability to pay for your medications: Yes,  mental health  Release of information consent forms completed and submitted to medical records by CSW.  Patient to Follow up at: Follow-up Information    Follow up with [facility].   Why:  Walk in between 8am-9am Monday through Friday for hospital follow-up/medication management/assessment for therapy services.    Contact information:   [address] [place], [place] [address] Phone: [phone] Fax: [phone]      Follow up with [facility] On 11/05/2015.   Why:  3:30pm appointment   Contact information:   [facility] [employer] [address] [place] [address] Phone: [phone]      Next level of care provider has access to [facility] Link:no  Patient denies SI/HI: Yes, during group/self report.   Safety Planning and Suicide Prevention discussed: Yes,  SPE completed with pt's boyfriend. Pt was supplied with SPE pamphlet and Mobile crisis information as well.   Have you used any form of tobacco in the last 30 days? (Cigarettes, Smokeless Tobacco, Cigars, and/or Pipes): Yes  Has patient been referred to the Quitline?: Patient refused referral  Smart, Herbert SetaHeather LCSW 11/05/2015, 9:50 AM

## 2015-11-13 ENCOUNTER — Emergency Department (HOSPITAL_COMMUNITY): Payer: Medicaid Other

## 2015-11-13 ENCOUNTER — Emergency Department (HOSPITAL_COMMUNITY)
Admission: EM | Admit: 2015-11-13 | Discharge: 2015-11-14 | Disposition: A | Discharge status: Home / Self Care | Payer: Medicaid Other | Attending: Emergency Medicine | Admitting: Emergency Medicine

## 2015-11-13 ENCOUNTER — Encounter (HOSPITAL_COMMUNITY): Payer: Self-pay | Admitting: Emergency Medicine

## 2015-11-13 DIAGNOSIS — R202 Paresthesia of skin: Secondary | ICD-10-CM | POA: Diagnosis not present

## 2015-11-13 DIAGNOSIS — Y9389 Activity, other specified: Secondary | ICD-10-CM | POA: Insufficient documentation

## 2015-11-13 DIAGNOSIS — Y998 Other external cause status: Secondary | ICD-10-CM | POA: Diagnosis not present

## 2015-11-13 DIAGNOSIS — F419 Anxiety disorder, unspecified: Secondary | ICD-10-CM | POA: Diagnosis not present

## 2015-11-13 DIAGNOSIS — Z79899 Other long term (current) drug therapy: Secondary | ICD-10-CM | POA: Diagnosis not present

## 2015-11-13 DIAGNOSIS — Y9289 Other specified places as the place of occurrence of the external cause: Secondary | ICD-10-CM | POA: Diagnosis not present

## 2015-11-13 DIAGNOSIS — R4781 Slurred speech: Secondary | ICD-10-CM | POA: Insufficient documentation

## 2015-11-13 DIAGNOSIS — Z8614 Personal history of Methicillin resistant Staphylococcus aureus infection: Secondary | ICD-10-CM | POA: Diagnosis not present

## 2015-11-13 DIAGNOSIS — W01198A Fall on same level from slipping, tripping and stumbling with subsequent striking against other object, initial encounter: Secondary | ICD-10-CM | POA: Insufficient documentation

## 2015-11-13 DIAGNOSIS — F17210 Nicotine dependence, cigarettes, uncomplicated: Secondary | ICD-10-CM | POA: Insufficient documentation

## 2015-11-13 DIAGNOSIS — M1990 Unspecified osteoarthritis, unspecified site: Secondary | ICD-10-CM | POA: Diagnosis not present

## 2015-11-13 DIAGNOSIS — G4700 Insomnia, unspecified: Secondary | ICD-10-CM | POA: Insufficient documentation

## 2015-11-13 DIAGNOSIS — R531 Weakness: Secondary | ICD-10-CM | POA: Diagnosis present

## 2015-11-13 DIAGNOSIS — R200 Anesthesia of skin: Secondary | ICD-10-CM | POA: Diagnosis not present

## 2015-11-13 DIAGNOSIS — J449 Chronic obstructive pulmonary disease, unspecified: Secondary | ICD-10-CM | POA: Insufficient documentation

## 2015-11-13 DIAGNOSIS — F1010 Alcohol abuse, uncomplicated: Secondary | ICD-10-CM | POA: Diagnosis not present

## 2015-11-13 DIAGNOSIS — Z8619 Personal history of other infectious and parasitic diseases: Secondary | ICD-10-CM | POA: Insufficient documentation

## 2015-11-13 DIAGNOSIS — S0990XA Unspecified injury of head, initial encounter: Secondary | ICD-10-CM | POA: Insufficient documentation

## 2015-11-13 DIAGNOSIS — S199XXA Unspecified injury of neck, initial encounter: Secondary | ICD-10-CM | POA: Diagnosis not present

## 2015-11-13 DIAGNOSIS — Z8719 Personal history of other diseases of the digestive system: Secondary | ICD-10-CM | POA: Diagnosis not present

## 2015-11-13 LAB — CBC WITH DIFFERENTIAL/PLATELET
Basophils Absolute: 0.1 10*3/uL (ref 0.0–0.1)
Basophils Relative: 1 %
EOS ABS: 0.1 10*3/uL (ref 0.0–0.7)
EOS PCT: 2 %
HCT: 33 % — ABNORMAL LOW (ref 36.0–46.0)
Hemoglobin: 10.3 g/dL — ABNORMAL LOW (ref 12.0–15.0)
LYMPHS ABS: 1.7 10*3/uL (ref 0.7–4.0)
Lymphocytes Relative: 24 %
MCH: 29.5 pg (ref 26.0–34.0)
MCHC: 31.2 g/dL (ref 30.0–36.0)
MCV: 94.6 fL (ref 78.0–100.0)
MONO ABS: 0.5 10*3/uL (ref 0.1–1.0)
MONOS PCT: 7 %
Neutro Abs: 4.6 10*3/uL (ref 1.7–7.7)
Neutrophils Relative %: 66 %
PLATELETS: 364 10*3/uL (ref 150–400)
RBC: 3.49 MIL/uL — AB (ref 3.87–5.11)
RDW: 19.5 % — AB (ref 11.5–15.5)
WBC: 6.9 10*3/uL (ref 4.0–10.5)

## 2015-11-13 LAB — COMPREHENSIVE METABOLIC PANEL
ALT: 41 U/L (ref 14–54)
AST: 56 U/L — AB (ref 15–41)
Albumin: 3.7 g/dL (ref 3.5–5.0)
Alkaline Phosphatase: 53 U/L (ref 38–126)
Anion gap: 11 (ref 5–15)
BUN: 7 mg/dL (ref 6–20)
CHLORIDE: 98 mmol/L — AB (ref 101–111)
CO2: 24 mmol/L (ref 22–32)
CREATININE: 0.5 mg/dL (ref 0.44–1.00)
Calcium: 8.9 mg/dL (ref 8.9–10.3)
GFR calc Af Amer: >60 mL/min (ref >60)
Glucose, Bld: 85 mg/dL (ref 65–99)
Potassium: 4.8 mmol/L (ref 3.5–5.1)
Sodium: 133 mmol/L — ABNORMAL LOW (ref 135–145)
Total Bilirubin: 0.8 mg/dL (ref 0.3–1.2)
Total Protein: 7.1 g/dL (ref 6.5–8.1)

## 2015-11-13 LAB — URINALYSIS, ROUTINE W REFLEX MICROSCOPIC
Bilirubin Urine: NEGATIVE
Glucose, UA: NEGATIVE mg/dL
Hgb urine dipstick: NEGATIVE
Ketones, ur: NEGATIVE mg/dL
LEUKOCYTES UA: NEGATIVE
NITRITE: NEGATIVE
PROTEIN: NEGATIVE mg/dL
SPECIFIC GRAVITY, URINE: 1.009 (ref 1.005–1.030)
pH: 7 (ref 5.0–8.0)

## 2015-11-13 LAB — ETHANOL: ALCOHOL ETHYL (B): 150 mg/dL — AB (ref <5)

## 2015-11-13 LAB — RAPID URINE DRUG SCREEN, HOSP PERFORMED
Amphetamines: NOT DETECTED
Barbiturates: NOT DETECTED
Benzodiazepines: NOT DETECTED
Cocaine: NOT DETECTED
Opiates: NOT DETECTED
Tetrahydrocannabinol: NOT DETECTED

## 2015-11-13 LAB — AMMONIA: AMMONIA: 28 umol/L (ref 9–35)

## 2015-11-13 MED ORDER — LORAZEPAM 2 MG/ML IJ SOLN
INTRAMUSCULAR | Status: AC
  Administered 2015-11-13: 1 mg via INTRAVENOUS
  Filled 2015-11-13: qty 1

## 2015-11-13 MED ORDER — LORAZEPAM 2 MG/ML IJ SOLN
1.0000 mg | Freq: Once | INTRAMUSCULAR | Status: AC
  Administered 2015-11-13: 1 mg via INTRAVENOUS

## 2015-11-13 MED ORDER — MORPHINE SULFATE (PF) 4 MG/ML IV SOLN
4.0000 mg | Freq: Once | INTRAVENOUS | Status: AC
  Administered 2015-11-13: 4 mg via INTRAMUSCULAR
  Filled 2015-11-13: qty 1

## 2015-11-13 MED ORDER — LORAZEPAM 1 MG PO TABS
1.0000 mg | ORAL_TABLET | Freq: Once | ORAL | Status: AC
  Administered 2015-11-13: 1 mg via ORAL
  Filled 2015-11-13: qty 1

## 2015-11-13 MED ORDER — LORAZEPAM 2 MG/ML IJ SOLN
1.0000 mg | Freq: Once | INTRAMUSCULAR | Status: DC
  Filled 2015-11-13: qty 1

## 2015-11-13 MED ORDER — LORAZEPAM 2 MG/ML IJ SOLN
1.0000 mg | Freq: Once | INTRAMUSCULAR | Status: AC
  Administered 2015-11-13: 1 mg via INTRAVENOUS
  Filled 2015-11-13: qty 1

## 2015-11-13 NOTE — ED Notes (Signed)
Placed pt in soft collar due to complaint.

## 2015-11-13 NOTE — ED Notes (Signed)
Patient states she started having some right sided weakness since yesterday morning. Patient states she also has neck pain due to fall that happen last night. Patient states she tripped of steps. Patient states she hit her head but denies any LOC . Patient is not on any blood thinner. Patient states she has been drinking this morning states she had 12 pack and 0.5 pint of liquor. Patient states she drinks from 0400 until she goes to sleep. Patient states she has not taken any of her daily medication.

## 2015-11-13 NOTE — ED Notes (Signed)
Patient started taking off all medical equipment state she was leaving. Patient states she was in pain and wanted pain medication. Patient then began cursing stating she is leaving this 'fucking" place. MD made aware of patients request. MD spoke with patient.

## 2015-11-13 NOTE — ED Notes (Signed)
Patient became agitated in MRI MD made aware. MD ordered IV ativan. Unable to start IV  Patient returned without scan complete due to meds needed.

## 2015-11-13 NOTE — ED Notes (Signed)
Melanie RN states IV infiltrate MD advised to give another 1mg  ativan given.

## 2015-11-13 NOTE — ED Notes (Addendum)
Pt ambulating independently in room and fell, unwitnessed. Reports that she lost her balance and hit her head on the trashcan. Unwitnessed fall. Assisted up to bed, VSS, pt placed on psey bed alarm and bed moved hall. Consulting civil engineerCharge RN notified. No visible surface trauma.

## 2015-11-13 NOTE — ED Notes (Signed)
Pt appears more shaky and restless. Seizure pads placed. Seizure precautions initiated and 1mg  of Ativan IV to be given.

## 2015-11-13 NOTE — ED Notes (Signed)
Patient ambulated to the restrooms x2 with any complaints.

## 2015-11-13 NOTE — ED Provider Notes (Signed)
CSN: [id number]     Arrival date & time 11/13/15  [id number] History   First MD Initiated Contact with Patient 11/13/15 [phone]     Chief Complaint  Patient presents with  . Weakness  . Neck Pain   (Consider location/radiation/quality/duration/timing/severity/associated sxs/prior Treatment) HPI  Patient is a 54 year old female with history of alcohol abuse, anxiety, and COPD who presents with right sided weakness and neck pain since waking up this morning. History is difficult as the patient is visibly intoxicated on interview. Patient reports that she woke up around 3:30 this morning with right neck pain and numbness throughout her entire right side. Patient reports that she fell and hit her head and neck on concrete last night. [facility], but has been drinking a 12 pack a day and 1 pint of liquor per day. Her last drink was about 30 minutes prior arriving to the hospital. Patient also reports mild weakness in her right upper extremity. No chest pain or shortness of breath. No fevers or chills. No nausea or vomiting.    Past Medical History  Diagnosis Date  . COPD (chronic obstructive pulmonary disease) (HCC)   . Anxiety   . Shortness of breath   . Arthritis   . GERD (gastroesophageal reflux disease)   . Full dentures   . Insomnia   . Alcohol abuse   . Hepatitis C   . Alcohol abuse   . History of MRSA infection   . History of encephalopathy    Past Surgical History  Procedure Laterality Date  . Arm surgery    . Hand surgery  1990    both rt/lt carpal tunnels  . Shoulder surgery      right and left-age [age]  . Orif ulnar / radial shaft fracture  1990    right-got infection-had total 10 surgeies 1990  . Orif ulnar fracture Left 05/23/2014    Procedure: OPEN REDUCTION INTERNAL FIXATION (ORIF) LEFT ULNAR FRACTURE;  Surgeon: Harvie JuniorJohn L Graves, MD;  Location: [facility];  Service: Orthopedics;  Laterality: Left;  . I&d  extremity Right 05/07/2015    Procedure: IRRIGATION AND DEBRIDEMENT  ELBOW ;  Surgeon: Dominica SeverinWilliam Gramig, MD;  Location: [facility] OR;  Service: Orthopedics;  Laterality: Right;   Family History  Problem Relation Age of Onset  . Breast cancer Mother    Social History  Substance Use Topics  . Smoking status: Current Every Day Smoker -- 1.00 packs/day for 20 years    Types: Cigarettes  . Smokeless tobacco: Never Used  . Alcohol Use: Yes     Comment: 1/5th Vodka every day    OB History    No data available     Review of Systems  Constitutional: Negative for fever and chills.  HENT: Negative.   Eyes: Negative.   Respiratory: Negative for shortness of breath.   Cardiovascular: Negative for chest pain.  Gastrointestinal: Negative.   Endocrine: Negative.   Genitourinary: Negative.   Musculoskeletal: Positive for neck pain.  Skin: Negative.   Allergic/Immunologic: Negative.   Neurological: Positive for numbness.  Hematological: Negative.   Psychiatric/Behavioral: Negative.       Allergies  Review of patient's allergies indicates no known allergies.  Home Medications   Prior to Admission medications   Medication Sig Start Date End Date Taking? Authorizing Provider  albuterol (PROVENTIL HFA;VENTOLIN HFA) 108 (90 BASE) MCG/ACT inhaler Inhale 2 puffs into the lungs every 6 (six) hours as needed for wheezing  or shortness of breath. 08/28/15  Yes Renae Fickle, MD  benztropine (COGENTIN) 0.5 MG tablet Take 1 tablet (0.5 mg total) by mouth 2 (two) times daily as needed for tremors (EPS). 11/05/15  Yes Beau Fanny, FNP  DULoxetine (CYMBALTA) 30 MG capsule Take 1 capsule (30 mg total) by mouth daily. 11/05/15  Yes Beau Fanny, FNP  gabapentin (NEURONTIN) 100 MG capsule Take 1 capsule (100 mg total) by mouth 3 (three) times daily. 11/05/15  Yes Beau Fanny, FNP  haloperidol (HALDOL) 2 MG tablet Take 1 tablet (2 mg total) by mouth 2 (two) times daily. 11/05/15  Yes Beau Fanny, FNP    thiamine 100 MG tablet Take 1 tablet (100 mg total) by mouth daily. 11/05/15  Yes Beau Fanny, FNP  traZODone (DESYREL) 50 MG tablet Take 1 tablet (50 mg total) by mouth at bedtime as needed for sleep. 11/05/15  Yes Beau Fanny, FNP  nicotine (NICODERM CQ - DOSED IN MG/24 HOURS) 21 mg/24hr patch Place 1 patch (21 mg total) onto the skin daily. Patient not taking: Reported on 11/13/2015 11/05/15   Everardo All Withrow, FNP   BP 132/98 mmHg  Pulse 77  Temp(Src) 97.7 F (36.5 C) (Oral)  Resp 27  SpO2 88% Physical Exam  Constitutional: She appears well-developed and well-nourished.  HENT:  Head: Normocephalic and atraumatic.  Right Ear: Tympanic membrane normal.  Left Ear: Tympanic membrane normal.  Eyes: EOM are normal. Pupils are equal, round, and reactive to light.  Neck: No spinous process tenderness present.    Cardiovascular: Normal rate, regular rhythm and normal heart sounds.   Pulmonary/Chest: Effort normal and breath sounds normal. No respiratory distress.  Abdominal: Soft. Bowel sounds are normal. She exhibits no distension. There is no tenderness.  Musculoskeletal: Normal range of motion. She exhibits no edema.  Neurological: She is alert. She has normal strength. A sensory deficit (Decreased sensation on right side of body) is present. No cranial nerve deficit.  Skin: Skin is warm and dry.  Psychiatric: Her speech is slurred.  Visibly intoxicated  Nursing note and vitals reviewed.   ED Course  Procedures (including critical care time) Labs Review Labs Reviewed  COMPREHENSIVE METABOLIC PANEL - Abnormal; Notable for the following:    Sodium 133 (*)    Chloride 98 (*)    AST 56 (*)    All other components within normal limits  CBC WITH DIFFERENTIAL/PLATELET - Abnormal; Notable for the following:    RBC 3.49 (*)    Hemoglobin 10.3 (*)    HCT 33.0 (*)    RDW 19.5 (*)    All other components within normal limits  ETHANOL - Abnormal; Notable for the following:     Alcohol, Ethyl (B) 150 (*)    All other components within normal limits  URINE RAPID DRUG SCREEN, HOSP PERFORMED    Imaging Review Ct Head Wo Contrast  11/13/2015  CLINICAL DATA:  Heavy ETOH abuse, fell last night. Complains of frontal headache and posterior neck pain. Patient also complains of right-sided weakness since yesterday. EXAM: CT HEAD WITHOUT CONTRAST CT CERVICAL SPINE WITHOUT CONTRAST TECHNIQUE: Multidetector CT imaging of the head and cervical spine was performed following the standard protocol without intravenous contrast. Multiplanar CT image reconstructions of the cervical spine were also generated. COMPARISON:  None. FINDINGS: CT HEAD FINDINGS There is generalized brain atrophy, moderate in degree for age, with commensurate dilatation of the ventricles and sulci. Study is somewhat limited by patient motion artifact. All  areas of the brain demonstrate grossly normal gray-white matter attenuation. There is no mass, hemorrhage, edema, or other evidence of acute parenchymal abnormality identified. No extra-axial hemorrhage. No osseous fracture or dislocation seen. Visualized upper paranasal sinuses are clear. Superficial soft tissues are unremarkable. CT CERVICAL SPINE FINDINGS This portion of the exam is also limited by patient motion artifact. There is slight reversal of the normal cervical spine lordosis likely related to patient positioning or muscle spasm. Mild degenerative change noted within the mid and lower cervical spine with associated disc space narrowings and mild osseous spurring. The motion artifact limits characterization of osseous detail but there is no obvious fracture line or displaced fracture fragment identified. Facet joints appear well aligned. Paravertebral soft tissues are unremarkable. IMPRESSION: 1. Head CT slightly limited by patient motion artifact but no evidence of acute intracranial abnormality seen. No intracranial mass, hemorrhage, or edema. No fracture. 2.  Cervical spine CT also limited by patient motion artifact but no evidence of fracture or acute subluxation identified in the cervical spine. Degenerative changes noted within the mid and lower cervical spine, mild to moderate in degree. Electronically Signed   By: Bary Richard M.D.   On: 11/13/2015 11:58   Ct Cervical Spine Wo Contrast  11/13/2015  CLINICAL DATA:  Heavy ETOH abuse, fell last night. Complains of frontal headache and posterior neck pain. Patient also complains of right-sided weakness since yesterday. EXAM: CT HEAD WITHOUT CONTRAST CT CERVICAL SPINE WITHOUT CONTRAST TECHNIQUE: Multidetector CT imaging of the head and cervical spine was performed following the standard protocol without intravenous contrast. Multiplanar CT image reconstructions of the cervical spine were also generated. COMPARISON:  None. FINDINGS: CT HEAD FINDINGS There is generalized brain atrophy, moderate in degree for age, with commensurate dilatation of the ventricles and sulci. Study is somewhat limited by patient motion artifact. All areas of the brain demonstrate grossly normal gray-white matter attenuation. There is no mass, hemorrhage, edema, or other evidence of acute parenchymal abnormality identified. No extra-axial hemorrhage. No osseous fracture or dislocation seen. Visualized upper paranasal sinuses are clear. Superficial soft tissues are unremarkable. CT CERVICAL SPINE FINDINGS This portion of the exam is also limited by patient motion artifact. There is slight reversal of the normal cervical spine lordosis likely related to patient positioning or muscle spasm. Mild degenerative change noted within the mid and lower cervical spine with associated disc space narrowings and mild osseous spurring. The motion artifact limits characterization of osseous detail but there is no obvious fracture line or displaced fracture fragment identified. Facet joints appear well aligned. Paravertebral soft tissues are unremarkable.  IMPRESSION: 1. Head CT slightly limited by patient motion artifact but no evidence of acute intracranial abnormality seen. No intracranial mass, hemorrhage, or edema. No fracture. 2. Cervical spine CT also limited by patient motion artifact but no evidence of fracture or acute subluxation identified in the cervical spine. Degenerative changes noted within the mid and lower cervical spine, mild to moderate in degree. Electronically Signed   By: Bary Richard M.D.   On: 11/13/2015 11:58   I have personally reviewed and evaluated these images and lab results as part of my medical decision-making.   EKG Interpretation   Date/Time:  Wednesday November 13 2015 09:31:45 EST Ventricular Rate:  63 PR Interval:  173 QRS Duration: 88 QT Interval:  427 QTC Calculation: 437 R Axis:   71 Text Interpretation:  Normal sinus rhythm Poor R wave progression  Confirmed by RAY MD, DANIELLE ([id number]) on 11/13/2015 1:47:59 PM  MDM   Final diagnoses:  None   Patient is a 54 year old female with history of alcohol abuse, anxiety, and COPD presenting with neck pain and right sided numbness for 7 hour prior to arrival in the setting of alcohol intoxication and fall yesterday. Patient has right neck paraspinal tenderness with no spinous process tenderness on exam. Patient does endorse decrease sensation on the right side of her body, however has normal strength throughout. Work up notable for elevated ethanol level. Head CT negative. Brain MR pending. Patient unable to lie still for MRI. Will give 2mg  PO ativan. Will sign off care to oncoming team.    Ardith Dark, MD 11/13/15 1539  Margarita Grizzle, MD 11/13/15 [phone]

## 2015-11-14 ENCOUNTER — Emergency Department (HOSPITAL_COMMUNITY): Payer: Medicaid Other

## 2015-11-14 MED ORDER — LORAZEPAM 2 MG/ML IJ SOLN
1.0000 mg | Freq: Once | INTRAMUSCULAR | Status: AC
  Administered 2015-11-14: 1 mg via INTRAVENOUS
  Filled 2015-11-14: qty 1

## 2015-11-14 MED ORDER — LORAZEPAM 1 MG PO TABS
1.0000 mg | ORAL_TABLET | Freq: Once | ORAL | Status: AC
  Administered 2015-11-14: 1 mg via ORAL
  Filled 2015-11-14: qty 1

## 2015-11-14 NOTE — ED Notes (Signed)
Dr. Fredderick PhenixBelfi speaking with Fayrene FearingJames. Pt. Significant other

## 2015-11-14 NOTE — ED Notes (Signed)
Pt. Pulled her Iv out.  Rn's [place] and [place] and [place], assisted in cleaning up pt. Gown changed and sheets changed.    IV site is not bleeding, dressing applied.  Pt. Also cussing at the staff with vulgar language.  Explained to pt. Not to use that language.  Unsuccessful.

## 2015-11-14 NOTE — ED Notes (Signed)
Patient transported to X-ray 

## 2015-11-14 NOTE — ED Notes (Signed)
Pt placed back in room and back on monitor per Dr. Rhunette CroftNanavati until Mr. Rhonda Mitchell is able to return our phone call. Pt given [place] sandwich and something to drink.

## 2015-11-14 NOTE — ED Notes (Signed)
Pt. Is up in the room , gait steady.

## 2015-11-14 NOTE — ED Notes (Signed)
Patient transported to MRI 

## 2015-11-14 NOTE — ED Notes (Signed)
Attempting to get pt to write down a phone number of someone we can call to come get her. Pt only scribbles something and given to Dr. Cyndie ChimeNguyen and Dr. Rhunette CroftNanavati.

## 2015-11-14 NOTE — ED Notes (Signed)
amb with pt to the bathroom. Pt somewhat steady on feet

## 2015-11-14 NOTE — ED Notes (Signed)
Meal Tray ordered.  

## 2015-11-14 NOTE — ED Notes (Addendum)
Pt. Will not stay in bed she is getting in and out.  Redirection is unsuccessful.  Dr. Fredderick PhenixBelfi is aware.  Pt. Is alert and oriented X 4

## 2015-11-14 NOTE — ED Notes (Signed)
Pt. Did call her significant other and she left a message on his phone

## 2015-11-14 NOTE — ED Notes (Signed)
Pt.  Is attempting to crawl out of the bed.   Verbal redirection  Given to pt., she  Does follow commands.  Dr. Rosalia Hammersay and Dr. Fredderick PhenixBelfi at the bedside assessing the pt. Attempted to call pt.s significant other no answer.

## 2015-11-14 NOTE — ED Provider Notes (Signed)
Care taken over from Dr. Nanavati.  Patient's [name] is here to pick up the patient. He states that she is at her baseline mental status. Patient is alert and oriented 4 although she has some unusual behavior at times. She is ambulating independently without difficulty. She has a mild tremor but otherwise does not appear to be in DTs. She has normal motor function in all extremities. She was discharged with her fianc.  Rolan BuccoMelanie Korban Shearer, MD 11/14/15 [phone]

## 2015-11-14 NOTE — ED Notes (Signed)
Pt still on bed alarm and attempting to get oob at this time. Placed back in bed by this RN and informed that Fayrene FearingJames has not called back yet.

## 2015-11-14 NOTE — ED Notes (Signed)
Pt. Refuses to keep her gown on.l She is walking in the room naked. She refuses to keep her BP cuff and Monitor or Pulse OX on .  NOtified Dr. Fredderick PhenixBelfi, she is at the bedside talking with the pt.  Assisted pt. With putting on her clothes.  She is sitting in the wheelchair.  Pt. s significant other Fayrene FearingJames called, he will be coming to pick up pt.  Dr. Fredderick PhenixBelfi notified.

## 2015-11-14 NOTE — Discharge Instructions (Signed)
Alcohol Use Disorder °Alcohol use disorder is a mental disorder. It is not a one-time incident of heavy drinking. Alcohol use disorder is the excessive and uncontrollable use of alcohol over time that leads to problems with functioning in one or more areas of daily living. People with this disorder risk harming themselves and others when they drink to excess. Alcohol use disorder also can cause other mental disorders, such as mood and anxiety disorders, and serious physical problems. People with alcohol use disorder often misuse other drugs.  °Alcohol use disorder is common and widespread. Some people with this disorder drink alcohol to cope with or escape from negative life events. Others drink to relieve chronic pain or symptoms of mental illness. People with a family history of alcohol use disorder are at higher risk of losing control and using alcohol to excess.  °Drinking too much alcohol can cause injury, accidents, and health problems. One drink can be too much when you are: °· Working. °· Pregnant or breastfeeding. °· Taking medicines. Ask your doctor. °· Driving or planning to drive. °SYMPTOMS  °Signs and symptoms of alcohol use disorder may include the following:  °· Consumption of alcohol in larger amounts or over a longer period of time than intended. °· Multiple unsuccessful attempts to cut down or control alcohol use.   °· A great deal of time spent obtaining alcohol, using alcohol, or recovering from the effects of alcohol (hangover). °· A strong desire or urge to use alcohol (cravings).   °· Continued use of alcohol despite problems at work, school, or home because of alcohol use.   °· Continued use of alcohol despite problems in relationships because of alcohol use. °· Continued use of alcohol in situations when it is physically hazardous, such as driving a car. °· Continued use of alcohol despite awareness of a physical or psychological problem that is likely related to alcohol use. Physical  problems related to alcohol use can involve the brain, heart, liver, stomach, and intestines. Psychological problems related to alcohol use include intoxication, depression, anxiety, psychosis, delirium, and dementia.   °· The need for increased amounts of alcohol to achieve the same desired effect, or a decreased effect from the consumption of the same amount of alcohol (tolerance). °· Withdrawal symptoms upon reducing or stopping alcohol use, or alcohol use to reduce or avoid withdrawal symptoms. Withdrawal symptoms include: °¨ Racing heart. °¨ Hand tremor. °¨ Difficulty sleeping. °¨ Nausea. °¨ Vomiting. °¨ Hallucinations. °¨ Restlessness. °¨ Seizures. °DIAGNOSIS °Alcohol use disorder is diagnosed through an assessment by your health care provider. Your health care provider may start by asking three or four questions to screen for excessive or problematic alcohol use. To confirm a diagnosis of alcohol use disorder, at least two symptoms must be present within a 12-month period. The severity of alcohol use disorder depends on the number of symptoms: °· Mild--two or three. °· Moderate--four or five. °· Severe--six or more. °Your health care provider may perform a physical exam or use results from lab tests to see if you have physical problems resulting from alcohol use. Your health care provider may refer you to a mental health professional for evaluation. °TREATMENT  °Some people with alcohol use disorder are able to reduce their alcohol use to low-risk levels. Some people with alcohol use disorder need to quit drinking alcohol. When necessary, mental health professionals with specialized training in substance use treatment can help. Your health care provider can help you decide how severe your alcohol use disorder is and what type of treatment you need.   The following forms of treatment are available:   Detoxification. Detoxification involves the use of prescription medicines to prevent alcohol withdrawal  symptoms in the first week after quitting. This is important for people with a history of symptoms of withdrawal and for heavy drinkers who are likely to have withdrawal symptoms. Alcohol withdrawal can be dangerous and, in severe cases, cause death. Detoxification is usually provided in a hospital or in-patient substance use treatment facility.  Counseling or talk therapy. Talk therapy is provided by substance use treatment counselors. It addresses the reasons people use alcohol and ways to keep them from drinking again. The goals of talk therapy are to help people with alcohol use disorder find healthy activities and ways to cope with life stress, to identify and avoid triggers for alcohol use, and to handle cravings, which can cause relapse.  Medicines.Different medicines can help treat alcohol use disorder through the following actions:  Decrease alcohol cravings.  Decrease the positive reward response felt from alcohol use.  Produce an uncomfortable physical reaction when alcohol is used (aversion therapy).  Support groups. Support groups are run by people who have quit drinking. They provide emotional support, advice, and guidance. These forms of treatment are often combined. Some people with alcohol use disorder benefit from intensive combination treatment provided by specialized substance use treatment centers. Both inpatient and outpatient treatment programs are available.   This information is not intended to replace advice given to you by your health care provider. Make sure you discuss any questions you have with your health care provider.   Document Released: 01/14/2005 Document Revised: 12/28/2014 Document Reviewed: 03/16/2013 Elsevier Interactive Patient Education 2016 [employer] Paresthesia Paresthesia is a burning or prickling feeling. This feeling can happen in any part of the body. It often happens in the hands, arms, legs, or feet. Usually, it is not painful. In most  cases, the feeling goes away in a short time and is not a sign of a serious problem. HOME CARE  Avoid drinking alcohol.  Try massage or needle therapy (acupuncture) to help with your problems.  Keep all follow-up visits as told by your doctor. This is important. GET HELP IF:  You keep on having episodes of paresthesia.  Your burning or prickling feeling gets worse when you walk.  You have pain or cramps.  You feel dizzy.  You have a rash. GET HELP RIGHT AWAY IF:  You feel weak.  You have trouble walking or moving.  You have problems speaking, understanding, or seeing.  You feel confused.  You cannot control when you pee (urinate) or poop (bowel movement).  You lose feeling (numbness) after an injury.  You pass out (faint).   This information is not intended to replace advice given to you by your health care provider. Make sure you discuss any questions you have with your health care provider.   Document Released: 11/19/2008 Document Revised: 04/23/2015 Document Reviewed: 12/03/2014 Elsevier Interactive Patient Education 2016 [employer]  Alcohol Use Disorder Alcohol use disorder is a mental disorder. It is not a one-time incident of heavy drinking. Alcohol use disorder is the excessive and uncontrollable use of alcohol over time that leads to problems with functioning in one or more areas of daily living. People with this disorder risk harming themselves and others when they drink to excess. Alcohol use disorder also can cause other mental disorders, such as mood and anxiety disorders, and serious physical problems. People with alcohol use disorder often misuse other drugs.  Alcohol  use disorder is common and widespread. Some people with this disorder drink alcohol to cope with or escape from negative life events. Others drink to relieve chronic pain or symptoms of mental illness. People with a family history of alcohol use disorder are at higher risk of losing control  and using alcohol to excess.  Drinking too much alcohol can cause injury, accidents, and health problems. One drink can be too much when you are:  Working.  Pregnant or breastfeeding.  Taking medicines. Ask your doctor.  Driving or planning to drive. SYMPTOMS  Signs and symptoms of alcohol use disorder may include the following:   Consumption ofalcohol inlarger amounts or over a longer period of time than intended.  Multiple unsuccessful attempts to cutdown or control alcohol use.   A great deal of time spent obtaining alcohol, using alcohol, or recovering from the effects of alcohol (hangover).  A strong desire or urge to use alcohol (cravings).   Continued use of alcohol despite problems at work, school, or home because of alcohol use.   Continued use of alcohol despite problems in relationships because of alcohol use.  Continued use of alcohol in situations when it is physically hazardous, such as driving a car.  Continued use of alcohol despite awareness of a physical or psychological problem that is likely related to alcohol use. Physical problems related to alcohol use can involve the brain, heart, liver, stomach, and intestines. Psychological problems related to alcohol use include intoxication, depression, anxiety, psychosis, delirium, and dementia.   The need for increased amounts of alcohol to achieve the same desired effect, or a decreased effect from the consumption of the same amount of alcohol (tolerance).  Withdrawal symptoms upon reducing or stopping alcohol use, or alcohol use to reduce or avoid withdrawal symptoms. Withdrawal symptoms include:  Racing heart.  Hand tremor.  Difficulty sleeping.  Nausea.  Vomiting.  Hallucinations.  Restlessness.  Seizures. DIAGNOSIS Alcohol use disorder is diagnosed through an assessment by your health care provider. Your health care provider may start by asking three or four questions to screen for excessive  or problematic alcohol use. To confirm a diagnosis of alcohol use disorder, at least two symptoms must be present within a 61-month period. The severity of alcohol use disorder depends on the number of symptoms:  Mild--two or three.  Moderate--four or five.  Severe--six or more. Your health care provider may perform a physical exam or use results from lab tests to see if you have physical problems resulting from alcohol use. Your health care provider may refer you to a mental health professional for evaluation. TREATMENT  Some people with alcohol use disorder are able to reduce their alcohol use to low-risk levels. Some people with alcohol use disorder need to quit drinking alcohol. When necessary, mental health professionals with specialized training in substance use treatment can help. Your health care provider can help you decide how severe your alcohol use disorder is and what type of treatment you need. The following forms of treatment are available:   Detoxification. Detoxification involves the use of prescription medicines to prevent alcohol withdrawal symptoms in the first week after quitting. This is important for people with a history of symptoms of withdrawal and for heavy drinkers who are likely to have withdrawal symptoms. Alcohol withdrawal can be dangerous and, in severe cases, cause death. Detoxification is usually provided in a hospital or in-patient substance use treatment facility.  Counseling or talk therapy. Talk therapy is provided by substance use treatment counselors.  It addresses the reasons people use alcohol and ways to keep them from drinking again. The goals of talk therapy are to help people with alcohol use disorder find healthy activities and ways to cope with life stress, to identify and avoid triggers for alcohol use, and to handle cravings, which can cause relapse.  Medicines.Different medicines can help treat alcohol use disorder through the following  actions:  Decrease alcohol cravings.  Decrease the positive reward response felt from alcohol use.  Produce an uncomfortable physical reaction when alcohol is used (aversion therapy).  Support groups. Support groups are run by people who have quit drinking. They provide emotional support, advice, and guidance. These forms of treatment are often combined. Some people with alcohol use disorder benefit from intensive combination treatment provided by specialized substance use treatment centers. Both inpatient and outpatient treatment programs are available.   This information is not intended to replace advice given to you by your health care provider. Make sure you discuss any questions you have with your health care provider.   Document Released: 01/14/2005 Document Revised: 12/28/2014 Document Reviewed: 03/16/2013 Elsevier Interactive Patient Education [employer]

## 2015-11-14 NOTE — ED Provider Notes (Signed)
Patient was accepted in sign out pending MRI.  While awaiting MRI patient had a fall while at the sink and hit her head.  She then was behaving strangely and was rocking back and forth and sitting up and laying back on examination.  Her vitals remained stable without tachycardia or hypertension.  Patient has a known history of alcoholism and had an ETOH level of 150 on arrival.  Patient was given Ativan for her symptoms with improvement.  She was not able to tolerate the MRI for several hours.  MRI was finally completed and was negative for acute process or stroke.  Patient did have further labs completed including an ammonia which was normal as well as a repeat EKG that was unremarkable and a normal UA.  Patient was not coherent enough for discharge and could not provide a contact number for an individual to pick her up to take her home.  Medically it did not appear that here was a reason to admit the patient but she was not safe for discharge.  Patient was signed out to the night team pending reevaluation after the patient slept.    Leta BaptistEmily Roe Nguyen, MD 11/14/15 [phone]

## 2015-11-14 NOTE — ED Notes (Signed)
Dr. Cyndie ChimeNguyen speaking with the patient.

## 2015-11-29 ENCOUNTER — Emergency Department (HOSPITAL_COMMUNITY)
Admission: EM | Admit: 2015-11-29 | Discharge: 2015-11-29 | Discharge status: Against Medical Advice | Payer: Medicaid Other | Attending: Emergency Medicine | Admitting: Emergency Medicine

## 2015-11-29 ENCOUNTER — Encounter (HOSPITAL_COMMUNITY): Payer: Self-pay | Admitting: Emergency Medicine

## 2015-11-29 ENCOUNTER — Emergency Department (HOSPITAL_COMMUNITY): Payer: Medicaid Other

## 2015-11-29 DIAGNOSIS — S0081XA Abrasion of other part of head, initial encounter: Secondary | ICD-10-CM | POA: Insufficient documentation

## 2015-11-29 DIAGNOSIS — Z8619 Personal history of other infectious and parasitic diseases: Secondary | ICD-10-CM | POA: Diagnosis not present

## 2015-11-29 DIAGNOSIS — Z8614 Personal history of Methicillin resistant Staphylococcus aureus infection: Secondary | ICD-10-CM | POA: Insufficient documentation

## 2015-11-29 DIAGNOSIS — R079 Chest pain, unspecified: Secondary | ICD-10-CM | POA: Insufficient documentation

## 2015-11-29 DIAGNOSIS — W19XXXA Unspecified fall, initial encounter: Secondary | ICD-10-CM | POA: Diagnosis not present

## 2015-11-29 DIAGNOSIS — R45851 Suicidal ideations: Secondary | ICD-10-CM | POA: Diagnosis not present

## 2015-11-29 DIAGNOSIS — F1010 Alcohol abuse, uncomplicated: Secondary | ICD-10-CM | POA: Insufficient documentation

## 2015-11-29 DIAGNOSIS — F911 Conduct disorder, childhood-onset type: Secondary | ICD-10-CM | POA: Diagnosis not present

## 2015-11-29 DIAGNOSIS — Y929 Unspecified place or not applicable: Secondary | ICD-10-CM | POA: Diagnosis not present

## 2015-11-29 DIAGNOSIS — R112 Nausea with vomiting, unspecified: Secondary | ICD-10-CM | POA: Diagnosis not present

## 2015-11-29 DIAGNOSIS — Y999 Unspecified external cause status: Secondary | ICD-10-CM | POA: Insufficient documentation

## 2015-11-29 DIAGNOSIS — Z8719 Personal history of other diseases of the digestive system: Secondary | ICD-10-CM | POA: Insufficient documentation

## 2015-11-29 DIAGNOSIS — F419 Anxiety disorder, unspecified: Secondary | ICD-10-CM | POA: Insufficient documentation

## 2015-11-29 DIAGNOSIS — Y939 Activity, unspecified: Secondary | ICD-10-CM | POA: Insufficient documentation

## 2015-11-29 DIAGNOSIS — G4700 Insomnia, unspecified: Secondary | ICD-10-CM | POA: Insufficient documentation

## 2015-11-29 DIAGNOSIS — F17210 Nicotine dependence, cigarettes, uncomplicated: Secondary | ICD-10-CM | POA: Insufficient documentation

## 2015-11-29 DIAGNOSIS — Z79899 Other long term (current) drug therapy: Secondary | ICD-10-CM | POA: Diagnosis not present

## 2015-11-29 DIAGNOSIS — Z8739 Personal history of other diseases of the musculoskeletal system and connective tissue: Secondary | ICD-10-CM | POA: Insufficient documentation

## 2015-11-29 DIAGNOSIS — R0602 Shortness of breath: Secondary | ICD-10-CM

## 2015-11-29 DIAGNOSIS — J441 Chronic obstructive pulmonary disease with (acute) exacerbation: Secondary | ICD-10-CM | POA: Diagnosis not present

## 2015-11-29 DIAGNOSIS — Z972 Presence of dental prosthetic device (complete) (partial): Secondary | ICD-10-CM | POA: Insufficient documentation

## 2015-11-29 NOTE — ED Notes (Signed)
Pt refusing care until she gets her "fucking medicine." Alerted PA

## 2015-11-29 NOTE — ED Notes (Signed)
Pt did not wait for discharge instructions.

## 2015-11-29 NOTE — Discharge Instructions (Signed)
1. Medications: usual home medications °2. Treatment: rest, drink plenty of fluids °3. Follow Up: please followup with your primary doctor for discussion of your diagnoses and further evaluation after today's visit; if you do not have a primary care doctor use the resource guide provided to find one; please return to the ER for new or worsening symptoms ° ° °Emergency Department Resource Guide °1) Find a Doctor and Pay Out of Pocket °Although you won't have to find out who is covered by your insurance plan, it is a good idea to ask around and get recommendations. You will then need to call the office and see if the doctor you have chosen will accept you as a new patient and what types of options they offer for patients who are self-pay. Some doctors offer discounts or will set up payment plans for their patients who do not have insurance, but you will need to ask so you aren't surprised when you get to your appointment. ° °2) Contact Your Local Health Department °Not all health departments have doctors that can see patients for sick visits, but many do, so it is worth a call to see if yours does. If you don't know where your local health department is, you can check in your phone book. The CDC also has a tool to help you locate your state's health department, and many state websites also have listings of all of their local health departments. ° °3) Find a Walk-in Clinic °If your illness is not likely to be very severe or complicated, you may want to try a walk in clinic. These are popping up all over the country in pharmacies, drugstores, and shopping centers. They're usually staffed by nurse practitioners or physician assistants that have been trained to treat common illnesses and complaints. They're usually fairly quick and inexpensive. However, if you have serious medical issues or chronic medical problems, these are probably not your best option. ° °No Primary Care Doctor: °- Call [facility] at  832-8000 -  they can help you locate a primary care doctor that  accepts your insurance, provides certain services, etc. °- Physician Referral Service- 1-800-533-3463 ° °Chronic Pain Problems: °Organization         Address  Phone   Notes  °[facility]  (336) 297-2271 Patients need to be referred by their primary care doctor.  ° °Medication Assistance: °Organization         Address  Phone   Notes  °[place] [facility] [address] °[place], [place] [address] (336) 641-8030 --Must be a resident of [place] °-- Must have NO insurance coverage whatsoever (no Medicaid/ Medicare, etc.) °-- The pt. MUST have a primary care doctor that directs their care regularly and follows them in the community °  °[facility]  (866) 331-1348   °[employer]  (888) 892-1162   ° °Agencies that provide inexpensive medical care: °Organization         Address  Phone   Notes  °[facility]  (336) 832-8035   °[facility]    (336) 832-7272   °[facility] [address] °[place], [place] [address] (336) 832-4777   °[facility] [address], °[place] (336) 271-4999   °[facility]    (336) 373-0678   °[facility]    (336) 272-1050   °[facility] ° [address],  Phone:  (336) 832-4444,   Fax:  (336) 832-4440 Hours of Operation:  9 am - 6 pm, M-F.  Also accepts Medicaid/Medicare and self-pay.  °[facility] ° [address], [place] Phone: (336) 832-3150, Fax: (336) 832-3151. Hours of Operation:  8:30 am - 5:30 pm, M-F.  Also accepts Medicaid and self-pay.  °[facility] [place] [address], [place] Phone: (336) 878-6027   °[facility] [address], [place], [place] (336)723-1848, Ext. 123 Mondays & Thursdays: 7-9 AM.  First 15 patients are seen on a first come, first serve basis. °  ° °Medicaid-accepting  [place] Providers: ° °Organization         Address  Phone   Notes  °[facility] [address], [place] (336) 641-2100 Also accepts self-pay patients.  °[facility] [address], [place] ° (336) 856-9996   °[facility] [address], [place] (336) 288-8857   °[facility] [address] [place] [address], [place] (336) 299-7000   °Veita Bland [address], [place]  ° (336) 373-1557 Only accepts [place] Access Medicaid patients after they have their name applied to their card.  ° °Self-Pay (no insurance) in [place]: ° °Organization         Address  Phone   Notes  °Sickle Cell Patients, [facility] [address], [place] (336) 832-1970   °[facility] [address], [place] (336) 832-4400   ° [facility] ° [address], [place] (336) 992-4800   °[facility]/Dr. Osei-Bonsu ° [address] [place] [address], [place] or [address], [place] (336) 841-8500 Phone number for both [place] and [place] locations is the same.  °[facility] [address], [place] (336) 299-0000   °[facility] [address] [place] [address], [place] or [address] (336) 852-7530 °(336) 878-2260   °[facility] [address], [place] (336) 350-1642, phone; (336) 294-5005, fax Sees patients 1st and 3rd Saturday of every month.  Must not qualify for public or private insurance (i.e. Medicaid, Medicare, [place] Health Choice, Veterans' Benefits) • Household income should be no more than 200% of the poverty level •The clinic cannot treat you if you are pregnant or think you are pregnant • Sexually transmitted diseases are not treated at the clinic.  ° ° °Dental Care: °Organization         Address  Phone  Notes  °[place] [facility] [address], [place] (336) 641-6152 Accepts children up to age 21 who are enrolled in Medicaid or [place] Health Choice; pregnant women with a Medicaid card; and children who have applied for Medicaid or [place] Health Choice, but were declined, whose parents can pay a reduced fee at time of service.  °[facility] [place]  [address], [place] (336) 641-7733 Accepts children up to age 21 who are enrolled in Medicaid or [place] Health Choice; pregnant women with a Medicaid card; and children who have applied for Medicaid or [place] Health Choice, but were declined, whose parents can pay a reduced fee at time of service.  °[facility] ° [address], [place] (336) 641-4533 Patients are seen by appointment only. Walk-ins are not   accepted. [facility] will see patients 18 years of age and older. °Monday - Tuesday (8am-5pm) °Most Wednesdays (8:30-5pm) °$30 per visit, cash only  °[facility] ° [address], [place] (336) 641-4533 Patients are seen by appointment only. Walk-ins are not accepted. [facility] will see patients 18 years of age and older. °One Wednesday Evening (Monthly: Volunteer Based).  $30 per visit, cash only  °[facility]  (919) 537-3737 for adults; Children under age 4, call Graduate Pediatric Dentistry at (919) [phone]. Children aged 4-14, please call (919) [phone] to request a pediatric application. ° Dental services are provided in all areas of dental care including fillings, crowns and bridges, complete and partial dentures, implants, gum treatment, root canals, and extractions. Preventive care is also provided. Treatment is provided to both adults and children. °Patients are selected via a lottery and there is often a waiting list. °  °[facility] [address], °[place] ° (336) 763-8833 [email or web address] °  °[facility] [address], [place], [place] (336)723-1848, Ext. 123 Second and Fourth Thursday of each month, opens at 6:30 AM; Clinic ends at 9 AM.  Patients are seen on a first-come first-served basis, and a limited number are seen during each clinic.  ° °[facility] ° [address], [place], [place] (336) 723-7904   Eligibility Requirements °You must have lived in [place], [place], or [place] counties for at least the last three months. °  You cannot be eligible for state or federal sponsored healthcare insurance, including Veterans Administration, Medicaid, or Medicare. °  You generally cannot be eligible for healthcare insurance through your employer.  °  How to apply: °Eligibility screenings are held every Tuesday and Wednesday afternoon from 1:00 pm until 4:00 pm. You do not need an appointment for the interview!  °[facility] [address], [place], [place] 336-631-2330   °[facility]  336-342-8273   °[facility]  336-703-3100   °[facility]  336-570-6415   ° °Behavioral Health Resources in the Community: °Intensive Outpatient Programs °Organization         Address  Phone  Notes  °[facility] [address], [place], [place] 336-878-6098   °[facility] [address], [place], [place] 336-832-9800   °[facility] [address], [place], [place] ° 336-882-2125   °[facility] [address],  °[place], [place] 1-800-853-5163 or 336-641-4981   °Substance Abuse Resources °Organization         Address  Phone  Notes  °[facility]  336-882-2125   °[facility]  336-784-9470   °[facility]  336-285-9073   °[facility]  336-845-3988   °Residential & Outpatient Substance Abuse Program  1-800-659-3381   °Psychological Services °Organization         Address  Phone  Notes  °[facility]   336- 832-9600   °[facility]  336- 378-7881   °[facility] [address], [place] 1-800-853-5163 or 336-641-4981   ° °Mobile Crisis Teams °Organization         Address  Phone  Notes  °[facility], Mobile Crisis Care Unit  1-877-626-1772   °Assertive °[facility] ° [address] [place], [place] 336-834-9664   °Sharon DeEsch [address] °[place] 336-554-5454   ° °Self-Help/Support Groups °  Organization         Address  Phone             Notes  °[facility] [place] - variety of support groups  336- 373-1402 Call for more information  °Narcotics Anonymous (NA), [facility] [address], °[place]  2 meetings at this location  ° °Residential Treatment Programs °Organization         Address  Phone  Notes  °[facility] [address],    °[place]  1-866-801-8205   °[facility] ° [address], [place], [place] 704-293-8524   °[facility] [address], [place] 336-845-3988 Admissions: 8am-3pm M-F  °[facility] [address]    °[place], [place] 336-841-1104   °[facility] [address], [place], [place] 336-379-7146   °[facility] [address]  °[place], [place] 336-285-9073   °[facility] - Intensive Outpatient [address], [place], [place] 336-852-3033   °[facility] [address]  °[place], [place] 1-877-615-2722 or 336-784-9470   °[facility] [address] [place], [place] 336-227-7417 Accepts Medicaid  °[facility] [address]  °[place] 1-800-659-3381 Substance Abuse/Addiction Treatment  ° °[place] Behavioral Health Resources °Organization         Address  Phone  Notes  °[facility]  (888) 581-9988   °Julie Brannon, PhD [address] [place], [place]   (336) 349-5553 or  (336) 951-0000   °[facility]   [address] °[place], [place] (336) 349-4454   °[facility] [address], [place], [place] (336) 342-8316 Insurance/Medicaid/sponsorship through [facility]  °[facility] [address]                                    [place], [place] (336) 342-8316 Therapy/tele-psych/case  °[facility] [address]  ° [place], [place] (336) 349-2233    °Dr. Arfeen  (336) 349-4544   °[facility] [place]  [employer] [place] [facility] 1) [address], [place] °2) [address], [place] °3)  [address], [place] (336) 349-3220 °(336) 342-7768 ° °(336) 342-8140   °[place] [facility] (336) 342-1394 or (336) 342-3537 (After Hours)    ° ° ° ° °

## 2015-11-29 NOTE — ED Notes (Signed)
Pt refusing blood work Charity fundraiserN and PA made aware. PA stated the was ok as long as head CT was ok. Hold on blood work for now per PA

## 2015-11-29 NOTE — ED Notes (Signed)
Pt given back her cigarettes, pt stated she was going to walk home.

## 2015-11-29 NOTE — ED Notes (Addendum)
Per EMS, the patient has been drinking alcohol in an attempt to kill herself over the last ten days. She called EMS, because she thought she was dying. She drank 8 pints of vodka, and 2 cases of beer over the last 10 days. Pt is physically and verbally aggressive in triage to any men she sees.   EMS started 22 G in left foot, gave 5 mg haldol in route. Pt complaining of right sided chest pain in triage, hx of COPD

## 2015-11-29 NOTE — ED Provider Notes (Signed)
CSN: [id number]     Arrival date & time 11/29/15  1438 History   First MD Initiated Contact with Patient 11/29/15 1520     Chief Complaint  Patient presents with  . Chest Pain  . Suicidal  . Alcohol Intoxication  . Medical Clearance  . Aggressive Behavior    HPI   Rhonda Mitchell is a 54 y.o. female with a PMH of COPD, anxiety, alcohol abuse who presents to the ED with chest pain and shortness of breath. She states she has been drinking 2 pints of liquor daily for the past 10 days. She states her last alcohol intake was this morning, at which time she consumed a pint of liquor. She reports constant right sided chest pain and shortness of breath. She denies exacerbating or alleviating factors. She denies drug use. She denies auditory or visual hallucinations. She denies suicidal or homicidal ideation. She states she was drinking because she is an alcoholic, however has no intent to harm herself. Per report, patient verbally aggressive in triage.   Past Medical History  Diagnosis Date  . COPD (chronic obstructive pulmonary disease) (HCC)   . Anxiety   . Shortness of breath   . Arthritis   . GERD (gastroesophageal reflux disease)   . Full dentures   . Insomnia   . Alcohol abuse   . Hepatitis C   . Alcohol abuse   . History of MRSA infection   . History of encephalopathy    Past Surgical History  Procedure Laterality Date  . Arm surgery    . Hand surgery  1990    both rt/lt carpal tunnels  . Shoulder surgery      right and left-age [age]  . Orif ulnar / radial shaft fracture  1990    right-got infection-had total 10 surgeies 1990  . Orif ulnar fracture Left 05/23/2014    Procedure: OPEN REDUCTION INTERNAL FIXATION (ORIF) LEFT ULNAR FRACTURE;  Surgeon: Harvie Junior, MD;  Location: [facility];  Service: Orthopedics;  Laterality: Left;  . I&d extremity Right 05/07/2015    Procedure: IRRIGATION AND DEBRIDEMENT  ELBOW ;  Surgeon: Dominica Severin, MD;  Location: [facility];   Service: Orthopedics;  Laterality: Right;   Family History  Problem Relation Age of Onset  . Breast cancer Mother    Social History  Substance Use Topics  . Smoking status: Current Every Day Smoker -- 1.00 packs/day for 20 years    Types: Cigarettes  . Smokeless tobacco: Never Used  . Alcohol Use: Yes     Comment: 1/5th Vodka every day    OB History    No data available      Review of Systems  Constitutional: Negative for fever and chills.  Respiratory: Positive for shortness of breath.   Cardiovascular: Positive for chest pain.  Gastrointestinal: Positive for nausea and vomiting. Negative for abdominal pain, diarrhea and constipation.  Neurological: Negative for dizziness, weakness, numbness and headaches.  All other systems reviewed and are negative.     Allergies  Review of patient's allergies indicates no known allergies.  Home Medications   Prior to Admission medications   Medication Sig Start Date End Date Taking? Authorizing Provider  albuterol (PROVENTIL HFA;VENTOLIN HFA) 108 (90 BASE) MCG/ACT inhaler Inhale 2 puffs into the lungs every 6 (six) hours as needed for wheezing or shortness of breath. 08/28/15  Yes Renae Fickle, MD  benztropine (COGENTIN) 0.5 MG tablet Take 1 tablet (0.5 mg total) by mouth 2 (two) times  daily as needed for tremors (EPS). 11/05/15  Yes Beau FannyJohn C Withrow, FNP  DULoxetine (CYMBALTA) 30 MG capsule Take 1 capsule (30 mg total) by mouth daily. 11/05/15  Yes Beau FannyJohn C Withrow, FNP  gabapentin (NEURONTIN) 100 MG capsule Take 1 capsule (100 mg total) by mouth 3 (three) times daily. 11/05/15  Yes Beau FannyJohn C Withrow, FNP  haloperidol (HALDOL) 2 MG tablet Take 1 tablet (2 mg total) by mouth 2 (two) times daily. 11/05/15  Yes Beau FannyJohn C Withrow, FNP  thiamine 100 MG tablet Take 1 tablet (100 mg total) by mouth daily. 11/05/15  Yes Beau FannyJohn C Withrow, FNP  traZODone (DESYREL) 50 MG tablet Take 1 tablet (50 mg total) by mouth at bedtime as needed for sleep. 11/05/15   Yes Beau FannyJohn C Withrow, FNP  nicotine (NICODERM CQ - DOSED IN MG/24 HOURS) 21 mg/24hr patch Place 1 patch (21 mg total) onto the skin daily. Patient not taking: Reported on 11/13/2015 11/05/15   Everardo AllJohn C Withrow, FNP    BP 156/94 mmHg  Pulse 83  Temp(Src) 97.5 F (36.4 C) (Oral)  Resp 15  SpO2 96% Physical Exam  Constitutional: She is oriented to person, place, and time. She appears well-developed and well-nourished. No distress.  HENT:  Head: Normocephalic.  Right Ear: External ear normal.  Left Ear: External ear normal.  Nose: Nose normal.  Mouth/Throat: Uvula is midline, oropharynx is clear and moist and mucous membranes are normal.  Superficial abrasions to forehead.  Eyes: Conjunctivae, EOM and lids are normal. Pupils are equal, round, and reactive to light. Right eye exhibits no discharge. Left eye exhibits no discharge. No scleral icterus.  Neck: Normal range of motion. Neck supple.  Cardiovascular: Normal rate, regular rhythm, normal heart sounds, intact distal pulses and normal pulses.   Pulmonary/Chest: Effort normal and breath sounds normal. No respiratory distress. She has no wheezes. She has no rales. She exhibits no tenderness.  Abdominal: Soft. Normal appearance and bowel sounds are normal. She exhibits no distension and no mass. There is no tenderness. There is no rigidity, no rebound and no guarding.  Musculoskeletal: Normal range of motion. She exhibits no edema or tenderness.  Neurological: She is alert and oriented to person, place, and time. She has normal strength. No cranial nerve deficit or sensory deficit.  Skin: Skin is warm, dry and intact. No rash noted. She is not diaphoretic. No erythema. No pallor.  Psychiatric: She has a normal mood and affect. Her speech is normal and behavior is normal.  Nursing note and vitals reviewed.   ED Course  Procedures (including critical care time)  Labs Review Labs Reviewed - No data to display  Imaging Review Dg Chest 2  View  11/29/2015  CLINICAL DATA:  Right side chest pain EXAM: CHEST  2 VIEW COMPARISON:  10/26/2015 FINDINGS: Cardiomediastinal silhouette is stable. No acute infiltrate or pleural effusion. No pulmonary edema. Bony thorax is unremarkable. IMPRESSION: No active cardiopulmonary disease. Electronically Signed   By: Natasha MeadLiviu  Pop M.D.   On: 11/29/2015 16:11   Ct Head Wo Contrast  11/29/2015  CLINICAL DATA:  Excessive alcohol intake. Mental status changes and combative. EXAM: CT HEAD WITHOUT CONTRAST TECHNIQUE: Contiguous axial images were obtained from the base of the skull through the vertex without intravenous contrast. COMPARISON:  Head CT 11/13/2015 and brain MRI 11/14/2015 FINDINGS: Stable age advanced cerebral atrophy and ventriculomegaly. No acute intracranial findings or mass lesions. The bony structures are intact. IMPRESSION: Stable age advanced cerebral atrophy. No acute intracranial findings. Electronically  Signed   By: Rudie Meyer M.D.   On: 11/29/2015 16:07     I have personally reviewed and evaluated these images as part of my medical decision-making.   EKG Interpretation None      MDM   Final diagnoses:  Fall  Alcohol abuse  Chest pain, unspecified chest pain type  Shortness of breath    54 year old female presents with chest pain and shortness of breath. Also reports increased alcohol intake. Denies drug use, hallucinations, SI, HI. Patient is afebrile. Vital signs stable. Heart RRR. Lungs clear to auscultation bilaterally. Abdomen soft, non-tender, non-distended. No lower extremity edema. Normal neuro exam with no focal deficit.  Patient refusing bloodwork. Patient discussed with Dr. Rubin Payor. Spoke with patient regarding importance of obtaining head CT given recent fall. Patient in agreement.  Head CT negative for acute intracranial findings. CXR negative for active cardiopulmonary disease.   Patient able to ambulate in the ED and is requesting to leave. She continues to  deny SI or HI. Discussed risks of leaving the ED against medical advice with patient. Patient verbalizes her understanding. Patient left before receiving discharge paperwork.  BP 156/94 mmHg  Pulse 83  Temp(Src) 97.5 F (36.4 C) (Oral)  Resp 15  SpO2 96%      Mady Gemma, PA-C 11/30/15 0058  Benjiman Core, MD 11/30/15 [phone]

## 2015-12-12 ENCOUNTER — Other Ambulatory Visit: Payer: Self-pay | Admitting: Licensed Clinical Social Worker

## 2015-12-18 ENCOUNTER — Inpatient Hospital Stay (HOSPITAL_COMMUNITY)
Admission: EM | Admit: 2015-12-18 | Discharge: 2015-12-20 | DRG: 371 | Disposition: A | Discharge status: Home / Self Care | Payer: Medicaid Other | Attending: Family Medicine | Admitting: Family Medicine

## 2015-12-18 ENCOUNTER — Emergency Department (HOSPITAL_COMMUNITY): Payer: Medicaid Other

## 2015-12-18 ENCOUNTER — Encounter (HOSPITAL_COMMUNITY): Payer: Self-pay | Admitting: Emergency Medicine

## 2015-12-18 DIAGNOSIS — M1990 Unspecified osteoarthritis, unspecified site: Secondary | ICD-10-CM | POA: Diagnosis present

## 2015-12-18 DIAGNOSIS — E871 Hypo-osmolality and hyponatremia: Secondary | ICD-10-CM | POA: Diagnosis present

## 2015-12-18 DIAGNOSIS — IMO0001 Reserved for inherently not codable concepts without codable children: Secondary | ICD-10-CM | POA: Diagnosis present

## 2015-12-18 DIAGNOSIS — E43 Unspecified severe protein-calorie malnutrition: Secondary | ICD-10-CM | POA: Diagnosis present

## 2015-12-18 DIAGNOSIS — Z79899 Other long term (current) drug therapy: Secondary | ICD-10-CM

## 2015-12-18 DIAGNOSIS — F1020 Alcohol dependence, uncomplicated: Secondary | ICD-10-CM | POA: Diagnosis present

## 2015-12-18 DIAGNOSIS — D72829 Elevated white blood cell count, unspecified: Secondary | ICD-10-CM | POA: Diagnosis present

## 2015-12-18 DIAGNOSIS — Z803 Family history of malignant neoplasm of breast: Secondary | ICD-10-CM

## 2015-12-18 DIAGNOSIS — A047 Enterocolitis due to Clostridium difficile: Secondary | ICD-10-CM | POA: Diagnosis not present

## 2015-12-18 DIAGNOSIS — R109 Unspecified abdominal pain: Secondary | ICD-10-CM

## 2015-12-18 DIAGNOSIS — J449 Chronic obstructive pulmonary disease, unspecified: Secondary | ICD-10-CM | POA: Diagnosis present

## 2015-12-18 DIAGNOSIS — R1110 Vomiting, unspecified: Secondary | ICD-10-CM

## 2015-12-18 DIAGNOSIS — F419 Anxiety disorder, unspecified: Secondary | ICD-10-CM | POA: Diagnosis present

## 2015-12-18 DIAGNOSIS — G4700 Insomnia, unspecified: Secondary | ICD-10-CM | POA: Diagnosis present

## 2015-12-18 DIAGNOSIS — B182 Chronic viral hepatitis C: Secondary | ICD-10-CM | POA: Diagnosis present

## 2015-12-18 DIAGNOSIS — R030 Elevated blood-pressure reading, without diagnosis of hypertension: Secondary | ICD-10-CM | POA: Diagnosis present

## 2015-12-18 DIAGNOSIS — Z8614 Personal history of Methicillin resistant Staphylococcus aureus infection: Secondary | ICD-10-CM | POA: Diagnosis not present

## 2015-12-18 DIAGNOSIS — Z6823 Body mass index (BMI) 23.0-23.9, adult: Secondary | ICD-10-CM

## 2015-12-18 DIAGNOSIS — F17210 Nicotine dependence, cigarettes, uncomplicated: Secondary | ICD-10-CM | POA: Diagnosis present

## 2015-12-18 DIAGNOSIS — E872 Acidosis: Secondary | ICD-10-CM | POA: Diagnosis present

## 2015-12-18 DIAGNOSIS — D539 Nutritional anemia, unspecified: Secondary | ICD-10-CM | POA: Diagnosis present

## 2015-12-18 DIAGNOSIS — R1084 Generalized abdominal pain: Secondary | ICD-10-CM

## 2015-12-18 DIAGNOSIS — Y906 Blood alcohol level of 120-199 mg/100 ml: Secondary | ICD-10-CM | POA: Diagnosis present

## 2015-12-18 DIAGNOSIS — F1010 Alcohol abuse, uncomplicated: Secondary | ICD-10-CM | POA: Diagnosis present

## 2015-12-18 DIAGNOSIS — K219 Gastro-esophageal reflux disease without esophagitis: Secondary | ICD-10-CM | POA: Diagnosis present

## 2015-12-18 DIAGNOSIS — R197 Diarrhea, unspecified: Secondary | ICD-10-CM | POA: Diagnosis not present

## 2015-12-18 LAB — RAPID URINE DRUG SCREEN, HOSP PERFORMED
Amphetamines: NOT DETECTED
BARBITURATES: NOT DETECTED
BENZODIAZEPINES: NOT DETECTED
COCAINE: NOT DETECTED
OPIATES: NOT DETECTED
TETRAHYDROCANNABINOL: NOT DETECTED

## 2015-12-18 LAB — CBC WITH DIFFERENTIAL/PLATELET
BASOS PCT: 0 %
Basophils Absolute: 0 10*3/uL (ref 0.0–0.1)
Eosinophils Absolute: 0 10*3/uL (ref 0.0–0.7)
Eosinophils Relative: 0 %
HEMATOCRIT: 31.9 % — AB (ref 36.0–46.0)
HEMOGLOBIN: 10.6 g/dL — AB (ref 12.0–15.0)
LYMPHS ABS: 1.5 10*3/uL (ref 0.7–4.0)
Lymphocytes Relative: 17 %
MCH: 29 pg (ref 26.0–34.0)
MCHC: 33.2 g/dL (ref 30.0–36.0)
MCV: 87.2 fL (ref 78.0–100.0)
MONOS PCT: 4 %
Monocytes Absolute: 0.4 10*3/uL (ref 0.1–1.0)
NEUTROS ABS: 6.9 10*3/uL (ref 1.7–7.7)
NEUTROS PCT: 79 %
Platelets: 292 10*3/uL (ref 150–400)
RBC: 3.66 MIL/uL — ABNORMAL LOW (ref 3.87–5.11)
RDW: 18.5 % — ABNORMAL HIGH (ref 11.5–15.5)
WBC: 8.7 10*3/uL (ref 4.0–10.5)

## 2015-12-18 LAB — AMMONIA: Ammonia: 33 umol/L (ref 9–35)

## 2015-12-18 LAB — SALICYLATE LEVEL

## 2015-12-18 LAB — COMPREHENSIVE METABOLIC PANEL
ALBUMIN: 3.8 g/dL (ref 3.5–5.0)
ALK PHOS: 72 U/L (ref 38–126)
ALT: 24 U/L (ref 14–54)
AST: 36 U/L (ref 15–41)
Anion gap: 14 (ref 5–15)
BUN: 8 mg/dL (ref 6–20)
CALCIUM: 8.3 mg/dL — AB (ref 8.9–10.3)
CHLORIDE: 92 mmol/L — AB (ref 101–111)
CO2: 18 mmol/L — AB (ref 22–32)
CREATININE: 0.57 mg/dL (ref 0.44–1.00)
GFR calc Af Amer: >60 mL/min (ref >60)
GFR calc non Af Amer: >60 mL/min (ref >60)
GLUCOSE: 106 mg/dL — AB (ref 65–99)
Potassium: 3.9 mmol/L (ref 3.5–5.1)
SODIUM: 124 mmol/L — AB (ref 135–145)
Total Bilirubin: <0.1 mg/dL — ABNORMAL LOW (ref 0.3–1.2)
Total Protein: 7.5 g/dL (ref 6.5–8.1)

## 2015-12-18 LAB — ETHANOL: Alcohol, Ethyl (B): 146 mg/dL — ABNORMAL HIGH (ref <5)

## 2015-12-18 LAB — URINALYSIS, ROUTINE W REFLEX MICROSCOPIC
Bilirubin Urine: NEGATIVE
GLUCOSE, UA: NEGATIVE mg/dL
HGB URINE DIPSTICK: NEGATIVE
Ketones, ur: NEGATIVE mg/dL
LEUKOCYTES UA: NEGATIVE
Nitrite: NEGATIVE
PROTEIN: NEGATIVE mg/dL
SPECIFIC GRAVITY, URINE: 1.016 (ref 1.005–1.030)
pH: 6 (ref 5.0–8.0)

## 2015-12-18 LAB — PREGNANCY, URINE: PREG TEST UR: NEGATIVE

## 2015-12-18 LAB — I-STAT BETA HCG BLOOD, ED (MC, WL, AP ONLY): I-stat hCG, quantitative: <5 m[IU]/mL (ref <5)

## 2015-12-18 LAB — OSMOLALITY, URINE: Osmolality, Ur: 459 mOsm/kg (ref 300–900)

## 2015-12-18 LAB — ACETAMINOPHEN LEVEL: Acetaminophen (Tylenol), Serum: 16 ug/mL (ref 10–30)

## 2015-12-18 LAB — NA AND K (SODIUM & POTASSIUM), RAND UR
Potassium Urine: 20 mmol/L
SODIUM UR: 18 mmol/L

## 2015-12-18 LAB — LIPASE, BLOOD: Lipase: 46 U/L (ref 11–51)

## 2015-12-18 LAB — I-STAT TROPONIN, ED: TROPONIN I, POC: 0 ng/mL (ref 0.00–0.08)

## 2015-12-18 MED ORDER — PANTOPRAZOLE SODIUM 40 MG IV SOLR
40.0000 mg | Freq: Every day | INTRAVENOUS | Status: DC
  Administered 2015-12-18: 40 mg via INTRAVENOUS
  Filled 2015-12-18: qty 40

## 2015-12-18 MED ORDER — LORAZEPAM 1 MG PO TABS: 1.0000 mg | ORAL_TABLET | Freq: Four times a day (QID) | ORAL | Status: DC | PRN

## 2015-12-18 MED ORDER — HYDRALAZINE HCL 20 MG/ML IJ SOLN
10.0000 mg | Freq: Once | INTRAMUSCULAR | Status: DC
Start: 2015-12-18 — End: 2015-12-18
  Administered 2015-12-18: 10 mg via INTRAVENOUS

## 2015-12-18 MED ORDER — IOHEXOL 300 MG/ML  SOLN
25.0000 mL | Freq: Once | INTRAMUSCULAR | Status: AC | PRN
  Administered 2015-12-18: 80 mL via ORAL

## 2015-12-18 MED ORDER — SODIUM CHLORIDE 0.9 % IJ SOLN
3.0000 mL | Freq: Two times a day (BID) | INTRAMUSCULAR | Status: DC
  Administered 2015-12-18 – 2015-12-20 (×4): 3 mL via INTRAVENOUS

## 2015-12-18 MED ORDER — LORAZEPAM 2 MG/ML IJ SOLN
1.0000 mg | Freq: Once | INTRAMUSCULAR | Status: AC
  Administered 2015-12-18: 1 mg via INTRAMUSCULAR
  Filled 2015-12-18: qty 1

## 2015-12-18 MED ORDER — HYDRALAZINE HCL 20 MG/ML IJ SOLN
10.0000 mg | INTRAMUSCULAR | Status: DC | PRN
  Filled 2015-12-18: qty 1

## 2015-12-18 MED ORDER — KETOROLAC TROMETHAMINE 30 MG/ML IJ SOLN
30.0000 mg | Freq: Once | INTRAMUSCULAR | Status: AC
  Administered 2015-12-18: 30 mg via INTRAVENOUS
  Filled 2015-12-18: qty 1

## 2015-12-18 MED ORDER — TRAZODONE HCL 50 MG PO TABS
50.0000 mg | ORAL_TABLET | Freq: Every evening | ORAL | Status: DC | PRN
  Administered 2015-12-20: 50 mg via ORAL
  Filled 2015-12-18: qty 1

## 2015-12-18 MED ORDER — BENZTROPINE MESYLATE 0.5 MG PO TABS: 0.5000 mg | ORAL_TABLET | Freq: Two times a day (BID) | ORAL | Status: DC | PRN

## 2015-12-18 MED ORDER — IOHEXOL 300 MG/ML  SOLN
100.0000 mL | Freq: Once | INTRAMUSCULAR | Status: AC | PRN
  Administered 2015-12-18: 80 mL via INTRAVENOUS

## 2015-12-18 MED ORDER — SODIUM CHLORIDE 0.9 % IV SOLN
1000.0000 mL | Freq: Once | INTRAVENOUS | Status: AC
Start: 2015-12-18 — End: 2015-12-18
  Administered 2015-12-18: 1000 mL via INTRAVENOUS

## 2015-12-18 MED ORDER — LORAZEPAM 2 MG/ML IJ SOLN
0.0000 mg | Freq: Four times a day (QID) | INTRAMUSCULAR | Status: DC
  Administered 2015-12-18 – 2015-12-20 (×3): 2 mg via INTRAVENOUS
  Filled 2015-12-18 (×3): qty 1

## 2015-12-18 MED ORDER — ACETAMINOPHEN 325 MG PO TABS
650.0000 mg | ORAL_TABLET | Freq: Four times a day (QID) | ORAL | Status: DC | PRN
  Administered 2015-12-19 – 2015-12-20 (×2): 650 mg via ORAL
  Filled 2015-12-18 (×2): qty 2

## 2015-12-18 MED ORDER — ONDANSETRON HCL 4 MG/2ML IJ SOLN
4.0000 mg | Freq: Once | INTRAMUSCULAR | Status: AC
  Administered 2015-12-18: 4 mg via INTRAVENOUS
  Filled 2015-12-18: qty 2

## 2015-12-18 MED ORDER — ACETAMINOPHEN 650 MG RE SUPP: 650.0000 mg | Freq: Four times a day (QID) | RECTAL | Status: DC | PRN

## 2015-12-18 MED ORDER — MORPHINE SULFATE (PF) 4 MG/ML IV SOLN: 4.0000 mg | Freq: Once | INTRAVENOUS | Status: DC

## 2015-12-18 MED ORDER — ENOXAPARIN SODIUM 40 MG/0.4ML ~~LOC~~ SOLN
40.0000 mg | Freq: Every day | SUBCUTANEOUS | Status: DC
  Administered 2015-12-18 – 2015-12-19 (×2): 40 mg via SUBCUTANEOUS
  Filled 2015-12-18 (×2): qty 0.4

## 2015-12-18 MED ORDER — FOLIC ACID 1 MG PO TABS
1.0000 mg | ORAL_TABLET | Freq: Every day | ORAL | Status: DC
  Administered 2015-12-19 – 2015-12-20 (×2): 1 mg via ORAL
  Filled 2015-12-18 (×2): qty 1

## 2015-12-18 MED ORDER — LORAZEPAM 2 MG/ML IJ SOLN
1.0000 mg | Freq: Once | INTRAMUSCULAR | Status: DC
  Filled 2015-12-18: qty 1

## 2015-12-18 MED ORDER — GABAPENTIN 100 MG PO CAPS
100.0000 mg | ORAL_CAPSULE | Freq: Three times a day (TID) | ORAL | Status: DC
  Administered 2015-12-18 – 2015-12-20 (×5): 100 mg via ORAL
  Filled 2015-12-18 (×5): qty 1

## 2015-12-18 MED ORDER — ADULT MULTIVITAMIN W/MINERALS CH
1.0000 | ORAL_TABLET | Freq: Every day | ORAL | Status: DC
  Administered 2015-12-19 – 2015-12-20 (×2): 1 via ORAL
  Filled 2015-12-18 (×2): qty 1

## 2015-12-18 MED ORDER — ALBUTEROL SULFATE (2.5 MG/3ML) 0.083% IN NEBU: 3.0000 mL | INHALATION_SOLUTION | Freq: Four times a day (QID) | RESPIRATORY_TRACT | Status: DC | PRN

## 2015-12-18 MED ORDER — DULOXETINE HCL 30 MG PO CPEP
30.0000 mg | ORAL_CAPSULE | Freq: Every day | ORAL | Status: DC
  Administered 2015-12-19 – 2015-12-20 (×2): 30 mg via ORAL
  Filled 2015-12-18 (×2): qty 1

## 2015-12-18 MED ORDER — LORAZEPAM 2 MG/ML IJ SOLN
1.0000 mg | Freq: Four times a day (QID) | INTRAMUSCULAR | Status: DC | PRN
  Administered 2015-12-19: 1 mg via INTRAVENOUS
  Filled 2015-12-18: qty 1

## 2015-12-18 MED ORDER — MORPHINE SULFATE (PF) 4 MG/ML IV SOLN
4.0000 mg | Freq: Once | INTRAVENOUS | Status: AC
  Administered 2015-12-18: 4 mg via INTRAVENOUS
  Filled 2015-12-18: qty 1

## 2015-12-18 MED ORDER — SODIUM CHLORIDE 0.9 % IV SOLN
1000.0000 mL | INTRAVENOUS | Status: DC
  Administered 2015-12-18: 1000 mL via INTRAVENOUS

## 2015-12-18 MED ORDER — ONDANSETRON HCL 4 MG/2ML IJ SOLN: 4.0000 mg | Freq: Four times a day (QID) | INTRAMUSCULAR | Status: DC | PRN

## 2015-12-18 MED ORDER — HALOPERIDOL 2 MG PO TABS
2.0000 mg | ORAL_TABLET | Freq: Two times a day (BID) | ORAL | Status: DC
  Administered 2015-12-18 – 2015-12-20 (×4): 2 mg via ORAL
  Filled 2015-12-18 (×5): qty 1

## 2015-12-18 MED ORDER — ONDANSETRON HCL 4 MG PO TABS: 4.0000 mg | ORAL_TABLET | Freq: Four times a day (QID) | ORAL | Status: DC | PRN

## 2015-12-18 MED ORDER — THIAMINE HCL 100 MG/ML IJ SOLN: 100.0000 mg | Freq: Every day | INTRAMUSCULAR | Status: DC

## 2015-12-18 MED ORDER — MORPHINE SULFATE (PF) 4 MG/ML IV SOLN
4.0000 mg | Freq: Once | INTRAVENOUS | Status: AC
  Administered 2015-12-18: 4 mg via INTRAMUSCULAR
  Filled 2015-12-18: qty 1

## 2015-12-18 MED ORDER — VITAMIN B-1 100 MG PO TABS
100.0000 mg | ORAL_TABLET | Freq: Every day | ORAL | Status: DC
  Administered 2015-12-19 – 2015-12-20 (×2): 100 mg via ORAL
  Filled 2015-12-18 (×2): qty 1

## 2015-12-18 MED ORDER — LORAZEPAM 2 MG/ML IJ SOLN: 0.0000 mg | Freq: Two times a day (BID) | INTRAMUSCULAR | Status: DC

## 2015-12-18 NOTE — ED Notes (Addendum)
PA Kayla notified that IV access has not been established. Dr. Brunetta JeansYao verbalizes he will make attempt.

## 2015-12-18 NOTE — ED Notes (Signed)
3 attempts to start peripheral iv. Was able to collect some blood, but not enough. IV consult done.

## 2015-12-18 NOTE — Progress Notes (Signed)
Pt admitted to unit from the ED  with  BP of 192/84, oncall provider notified

## 2015-12-18 NOTE — ED Notes (Signed)
Pt in CT.

## 2015-12-18 NOTE — H&P (Addendum)
[facility] History and PhysMarielis Samarae Mitchell ZOX:[id number] DOB: 09-21-1961 DOA: 12/18/2015  Referring physician: Ms. Okey Dupre. PCP: No primary care provider on file.  Specialists: None.  Chief Complaint: Abdominal pain with nausea and diarrhea.  HPI: Rhonda Mitchell is a 54 y.o. female with known history of alcohol abuse, chronic anemia, hepatitis C presents to the ER because of diarrhea with abdominal pain and nausea. Patient has been having persistent diarrhea for last 4 weeks. Abdominal pain is mostly in the epigastric area with nausea. CT of the abdomen and pelvis was unremarkable. Patient's lab work shows sodium of 124. Patient states she has been drinking alcohol every day. At this time patient has been admitted for further management. Patient has history of COPD and has chronic shortness of breath. In the ER patient was given 1 L fluid bolus. Patient states she has not recently taken any antibiotics. Denies any sick contacts.  Review of Systems: As presented in the history of presenting illness, rest negative.  Past Medical History  Diagnosis Date  . COPD (chronic obstructive pulmonary disease) (HCC)   . Anxiety   . Shortness of breath   . Arthritis   . GERD (gastroesophageal reflux disease)   . Full dentures   . Insomnia   . Alcohol abuse   . Hepatitis C   . Alcohol abuse   . History of MRSA infection   . History of encephalopathy    Past Surgical History  Procedure Laterality Date  . Arm surgery    . Hand surgery  1990    both rt/lt carpal tunnels  . Shoulder surgery      right and left-age [age]  . Orif ulnar / radial shaft fracture  1990    right-got infection-had total 10 surgeies 1990  . Orif ulnar fracture Left 05/23/2014    Procedure: OPEN REDUCTION INTERNAL FIXATION (ORIF) LEFT ULNAR FRACTURE;  Surgeon: Harvie Junior, MD;  Location: [facility];  Service: Orthopedics;  Laterality: Left;  . I&d extremity Right 05/07/2015    Procedure:  IRRIGATION AND DEBRIDEMENT  ELBOW ;  Surgeon: Dominica Severin, MD;  [facility]: [facility];  Service: Orthopedics;  Laterality: Right;   Social History:  reports that she has been smoking Cigarettes.  She has a 20 pack-year smoking history. She has never used smokeless tobacco. She reports that she drinks alcohol. She reports that she does not use illicit drugs. Where does patient live home. Can patient participate in ADLs? Yes.  No Known Allergies  Family History:  Family History  Problem Relation Age of Onset  . Breast cancer Mother       Prior to Admission medications   Medication Sig Start Date End Date Taking? Authorizing Provider  albuterol (PROVENTIL HFA;VENTOLIN HFA) 108 (90 BASE) MCG/ACT inhaler Inhale 2 puffs into the lungs every 6 (six) hours as needed for wheezing or shortness of breath. 08/28/15  Yes Renae Fickle, MD  benztropine (COGENTIN) 0.5 MG tablet Take 1 tablet (0.5 mg total) by mouth 2 (two) times daily as needed for tremors (EPS). 11/05/15  Yes Beau Fanny, FNP  DULoxetine (CYMBALTA) 30 MG capsule Take 1 capsule (30 mg total) by mouth daily. 11/05/15  Yes Beau Fanny, FNP  gabapentin (NEURONTIN) 100 MG capsule Take 1 capsule (100 mg total) by mouth 3 (three) times daily. 11/05/15  Yes Beau Fanny, FNP  haloperidol (HALDOL) 2 MG tablet Take 1 tablet (2 mg total) by mouth 2 (two) times daily. 11/05/15  Yes John  C Withrow, FNP  ibuprofen (ADVIL,MOTRIN) 200 MG tablet Take 400 mg by mouth every 6 (six) hours as needed for moderate pain.   Yes Historical Provider, MD  Pseudoephedrine-APAP-DM (DAYQUIL MULTI-SYMPTOM COLD/FLU PO) Take 5 mLs by mouth every 6 (six) hours.   Yes Historical Provider, MD  thiamine 100 MG tablet Take 1 tablet (100 mg total) by mouth daily. 11/05/15  Yes John C Withrow, FNP  traZODone (DESYREL) 50 MG tablet Take 1 tablet (50 mg total) by mouth at bedtime as needed for sleep. 11/05/15  Yes John C Withrow, FNP  nicotine (NICODERM CQ - DOSED IN MG/24  HOURS) 21 mg/24hr patch Place 1 patch (21 mg total) onto the skin daily. Patient not taking: Reported on 11/13/2015 11/05/15   John C Withrow, FNP    Physical Exam: Filed Vitals:   12/18/15 1830 12/18/15 1940 12/18/15 2102 12/18/15 2156  BP: 179/103 187/104 175/110 192/84  Pulse:  81 81 84  Temp:    98.1 F (36.7 C)  TempSrc:    Oral  Resp: 16 18 18    Height:    5' (1.524 m)  Weight:    54.6 kg (120 lb 5.9 oz)  SpO2:  97% 95% 94%     General:  Moderately built and poorly nourished.  Eyes: Anicteric no pallor.  ENT: No discharge from the ears eyes nose and mouth.  Neck: No mass felt.  Cardiovascular: S1 and S2 heard.   Respiratory: No rhonchi or crepitations.  Abdomen: Soft nontender bowel sounds present.  Skin: No rash.  Musculoskeletal: No edema.  Psychiatric: Appears normal.  Neurologic: Alert awake oriented to time place and person. Moves all extremity.  Labs on Admission:  Basic Metabolic Panel:  Recent Labs Lab 12/18/15 1546  NA 124*  K 3.9  CL 92*  CO2 18*  GLUCOSE 106*  BUN 8  CREATININE 0.57  CALCIUM 8.3*   Liver Function Tests:  Recent Labs Lab 12/18/15 1546  AST 36  ALT 24  ALKPHOS 72  BILITOT <0.1*  PROT 7.5  ALBUMIN 3.8    Recent Labs Lab 12/18/15 1546  LIPASE 46    Recent Labs Lab 12/18/15 1719  AMMONIA 33   CBC:  Recent Labs Lab 12/18/15 1719  WBC 8.7  NEUTROABS 6.9  HGB 10.6*  HCT 31.9*  MCV 87.2  PLT 292   Cardiac Enzymes: No results for input(s): CKTOTAL, CKMB, CKMBINDEX, TROPONINI in the last 168 hours.  BNP (last 3 results)  Recent Labs  03/18/15 2109 08/03/15 1820  BNP 234.0* 55.1    ProBNP (last 3 results) No results for input(s): PROBNP in the last 8760 hours.  CBG: No results for input(s): GLUCAP in the last 168 hours.  Radiological Exams on Admission: Dg Chest 2 View  12/18/2015  CLINICAL DATA:  Cough for 4 weeks. COPD, smoker, shortness of Breath EXAM: CHEST  2 VIEW COMPARISON:   11/29/2015 FINDINGS: The heart size and mediastinal contours are within normal limits. Both lungs are clear. The visualized skeletal structures are unremarkable. IMPRESSION: No active cardiopulmonary disease. Electronically Signed   By: Kevin  Dover M.D.   On: 12/18/2015 16:19   Ct Abdomen Pelvis W Contrast  12/18/2015  CLINICAL DATA:  54 year old female with abdominal pain and distention. EXAM: CT ABDOMEN AND PELVIS WITH CONTRAST TECHNIQUE: Multidetector CT imaging of the abdomen and pelvis was performed using the standard protocol following bolus administration of intravenous contrast. CONTRAST:  [id number] [address]ul[id number] [phone] Sellar 4th DriveWaSisters [phone] Po[address]alth Cen50CourKoreatney862 [phone] 842XHill C125 [address]. PDulce 71CourKoreatney843 [phone] 528XSeda7218 SoDulce [phone] RDulce [phone] HaDulce [phone] Sell[address] Lan[phone] BDulce [phone] SellarOak Streetittle [phone] [address] Ke[phone] L34[address].al [phone].y([phone] Dal45Dul[address] WaSeaside KentuckyEndoSelect Specialty HospKentuckyitWa82956ovnanian Enterprisesool teacher  COMPARISON:  CT dated 04/23/2014 FINDINGS: The visualized lung bases are clear. No intra-abdominal free air or free fluid. The liver, gallbladder, pancreas, spleen, and adrenal glands appear unremarkable. Subcentimeter stable left renal hypodense lesion is not well characterized but likely represents a cyst. There is no hydronephrosis on either side. The visualized ureters and urinary bladder appear unremarkable. The uterus and the ovaries are grossly unremarkable. There scattered colonic diverticula without active inflammation. There is no evidence of bowel obstruction or inflammation. Normal appendix. Mild aortoiliac atherosclerotic disease. The origins of the celiac axis, SMA, IMA as well as the origins of the renal arteries are patent. No portal venous gas identified. There is no lymphadenopathy. Small fat containing umbilical hernia. The abdominal wall soft tissues are otherwise unremarkable. There bilateral L5 pars defects with grade I L5-S1 anterolisthesis. Stable appearing mild to T11 superior endplate compression. No acute fracture identified. IMPRESSION: No acute intra-abdominal pelvic pathology. Electronically Signed   By: Elgie Collard M.D.   On: 12/18/2015 19:04    Assessment/Plan Principal Problem:   Abdominal pain,  vomiting, and diarrhea Active Problems:   COPD (chronic obstructive pulmonary disease) (HCC)   Hyponatremia   Chronic hepatitis C without hepatic coma (HCC)   Severe protein-calorie malnutrition (HCC)   Alcohol abuse   Abdominal pain   Elevated blood pressure   1. Abdominal pain or diarrhea - most likely alcohol related with alcoholic gastritis. Patient has been placed on PPI. Diarrhea could be related to alcoholism to but will check stool studies for any GI pathogen. 2. Hyponatremia - given the elevated urine osmolarity and urine sodium patient probably has hyponatremia from alcoholism. Patient did receive IV fluid bolus in the ER. We will restrict further fluids. Closely follow metabolic panel. 3. Alcohol abuse with anion gap metabolic acidosis - patient has been placed on CIWA protocol with Ativan and thiamine. Social work consult. 4. Elevated blood pressure - probably from pain and withdrawal. Patient has been placed on when necessary IV hydralazine. Closely follow blood pressure trends. 5. History of hepatitis C - further management as outpatient. 6. COPD presently not wheezing. 7. History of bipolar - continue present medications. 8. Chronic anemia - follow CBC.   DVT Prophylaxis Lovenox.  Code Status: Full code.   Family Communication: Discussed with patient.  Disposition Plan: Admit to inpatient.    Kalyn Hofstra N. [facility] Pager [phone].  If 7PM-7AM, please contact night-coverage [email or web address] Password [facility] 12/18/2015, 10:29 PM

## 2015-12-18 NOTE — ED Notes (Signed)
IV team unable to collect labs, RN is aware.

## 2015-12-18 NOTE — ED Provider Notes (Signed)
CSN: [id number]     Arrival date & time 12/18/15  1457 History   First MD Initiated Contact with Patient 12/18/15 1522     Chief Complaint  Patient presents with  . Abdominal Pain  . Shortness of Breath     (Consider location/radiation/quality/duration/timing/severity/associated sxs/prior Treatment) HPI   Rhonda Mitchell is a 54 y.o. female with PMH significant for COPD, anxiety, SOB, GERD, alcohol abuse, hepatitis C who presents with 4 week history of gradually worsening diarrhea.  Patient reports she experiences >20 episodes of watery non-bloody stools a day.  She reports drinking 4 pints of vodka a day to get rid of the pain.  She reports her last drink was yesterday at 9 PM, and states that she has had seizures in the past from alcohol withdrawal.  Denies illicit drug use.  Associated symptoms include CP, SOB, abdominal pain, and dysuria.  Denies N/V, seizures, hematuria, bloody stools, leg swelling.     Past Medical History  Diagnosis Date  . COPD (chronic obstructive pulmonary disease) (HCC)   . Anxiety   . Shortness of breath   . Arthritis   . GERD (gastroesophageal reflux disease)   . Full dentures   . Insomnia   . Alcohol abuse   . Hepatitis C   . Alcohol abuse   . History of MRSA infection   . History of encephalopathy    Past Surgical History  Procedure Laterality Date  . Arm surgery    . Hand surgery  1990    both rt/lt carpal tunnels  . Shoulder surgery      right and left-age [age]  . Orif ulnar / radial shaft fracture  1990    right-got infection-had total 10 surgeies 1990  . Orif ulnar fracture Left 05/23/2014    Procedure: OPEN REDUCTION INTERNAL FIXATION (ORIF) LEFT ULNAR FRACTURE;  Surgeon: Harvie JuniorJohn L Graves, MD;  Location: [facility];  Service: Orthopedics;  Laterality: Left;  . I&d extremity Right 05/07/2015    Procedure: IRRIGATION AND DEBRIDEMENT  ELBOW ;  Surgeon: Dominica SeverinWilliam Gramig, MD;  Location: [facility];  Service: Orthopedics;  Laterality:  Right;   Family History  Problem Relation Age of Onset  . Breast cancer Mother    Social History  Substance Use Topics  . Smoking status: Current Every Day Smoker -- 1.00 packs/day for 20 years    Types: Cigarettes  . Smokeless tobacco: Never Used  . Alcohol Use: Yes     Comment: 1/5th Vodka every day    OB History    No data available     Review of Systems All other systems negative unless otherwise stated in HPI    Allergies  Review of patient's allergies indicates no known allergies.  Home Medications   Prior to Admission medications   Medication Sig Start Date End Date Taking? Authorizing Provider  albuterol (PROVENTIL HFA;VENTOLIN HFA) 108 (90 BASE) MCG/ACT inhaler Inhale 2 puffs into the lungs every 6 (six) hours as needed for wheezing or shortness of breath. 08/28/15   Renae FickleMackenzie Short, MD  benztropine (COGENTIN) 0.5 MG tablet Take 1 tablet (0.5 mg total) by mouth 2 (two) times daily as needed for tremors (EPS). 11/05/15   Beau FannyJohn C Withrow, FNP  DULoxetine (CYMBALTA) 30 MG capsule Take 1 capsule (30 mg total) by mouth daily. 11/05/15   Beau FannyJohn C Withrow, FNP  gabapentin (NEURONTIN) 100 MG capsule Take 1 capsule (100 mg total) by mouth 3 (three) times daily. 11/05/15   Beau FannyJohn C Withrow, FNP  haloperidol (HALDOL) 2 MG tablet Take 1 tablet (2 mg total) by mouth 2 (two) times daily. 11/05/15   Beau Fanny, FNP  nicotine (NICODERM CQ - DOSED IN MG/24 HOURS) 21 mg/24hr patch Place 1 patch (21 mg total) onto the skin daily. Patient not taking: Reported on 11/13/2015 11/05/15   Beau Fanny, FNP  thiamine 100 MG tablet Take 1 tablet (100 mg total) by mouth daily. 11/05/15   Beau Fanny, FNP  traZODone (DESYREL) 50 MG tablet Take 1 tablet (50 mg total) by mouth at bedtime as needed for sleep. 11/05/15   Everardo All Withrow, FNP   BP 179/103 mmHg  Pulse 78  Temp(Src) 98.2 F (36.8 C) (Oral)  Resp 16  SpO2 99% Physical Exam  Constitutional: She is oriented to person, place, and  time. She appears well-developed and well-nourished.  Patient appears agitated and is moaning "please help me, I need something for pain, you have to help me"  HENT:  Head: Normocephalic and atraumatic.  Mouth/Throat: Oropharynx is clear and moist.  Eyes: Conjunctivae are normal. Pupils are equal, round, and reactive to light.  Neck: Normal range of motion. Neck supple.  Cardiovascular: Normal rate, regular rhythm and normal heart sounds.   No murmur heard. Pulmonary/Chest: Effort normal and breath sounds normal. No accessory muscle usage or stridor. No respiratory distress. She has no wheezes. She has no rhonchi. She has no rales.  Abdominal: Soft. Bowel sounds are normal. She exhibits distension. There is generalized tenderness. There is no rebound and no guarding.  Musculoskeletal: Normal range of motion.  Lymphadenopathy:    She has no cervical adenopathy.  Neurological: She is alert and oriented to person, place, and time.  Speech clear without dysarthria.  Skin: Skin is warm and dry.  Psychiatric: She has a normal mood and affect. Her behavior is normal.    ED Course  Procedures (including critical care time) Labs Review Labs Reviewed  COMPREHENSIVE METABOLIC PANEL - Abnormal; Notable for the following:    Sodium 124 (*)    Chloride 92 (*)    CO2 18 (*)    Glucose, Bld 106 (*)    Calcium 8.3 (*)    Total Bilirubin <0.1 (*)    All other components within normal limits  CBC WITH DIFFERENTIAL/PLATELET - Abnormal; Notable for the following:    RBC 3.66 (*)    Hemoglobin 10.6 (*)    HCT 31.9 (*)    RDW 18.5 (*)    All other components within normal limits  ETHANOL - Abnormal; Notable for the following:    Alcohol, Ethyl (B) 146 (*)    All other components within normal limits  LIPASE, BLOOD  PREGNANCY, URINE  URINALYSIS, ROUTINE W REFLEX MICROSCOPIC (NOT AT [facility])  URINE RAPID DRUG SCREEN, HOSP PERFORMED  AMMONIA  ACETAMINOPHEN LEVEL  SALICYLATE LEVEL  CBC WITH  DIFFERENTIAL/PLATELET  NA AND K (SODIUM & POTASSIUM), RAND UR  OSMOLALITY, URINE  I-STAT TROPOININ, ED  I-STAT BETA HCG BLOOD, ED ([facility], [facility], [facility] ONLY)    Imaging Review Dg Chest 2 View  12/18/2015  CLINICAL DATA:  Cough for 4 weeks. COPD, smoker, shortness of Breath EXAM: CHEST  2 VIEW COMPARISON:  11/29/2015 FINDINGS: The heart size and mediastinal contours are within normal limits. Both lungs are clear. The visualized skeletal structures are unremarkable. IMPRESSION: No active cardiopulmonary disease. Electronically Signed   By: Charlett Nose M.D.   On: 12/18/2015 16:19   Ct Abdomen Pelvis W Contrast  12/18/2015  CLINICAL DATA:  54 year old female with abdominal pain and distention. EXAM: CT ABDOMEN AND PELVIS WITH CONTRAST TECHNIQUE: Multidetector CT imaging of the abdomen and pelvis was performed using the standard protocol following bolus administration of intravenous contrast. CONTRAST:  80mL OMNIPAQUE IOHEXOL 300 MG/ML SOLN, 80mL OMNIPAQUE IOHEXOL 300 MG/ML SOLN COMPARISON:  CT dated 04/23/2014 FINDINGS: The visualized lung bases are clear. No intra-abdominal free air or free fluid. The liver, gallbladder, pancreas, spleen, and adrenal glands appear unremarkable. Subcentimeter stable left renal hypodense lesion is not well characterized but likely represents a cyst. There is no hydronephrosis on either side. The visualized ureters and urinary bladder appear unremarkable. The uterus and the ovaries are grossly unremarkable. There scattered colonic diverticula without active inflammation. There is no evidence of bowel obstruction or inflammation. Normal appendix. Mild aortoiliac atherosclerotic disease. The origins of the celiac axis, SMA, IMA as well as the origins of the renal arteries are patent. No portal venous gas identified. There is no lymphadenopathy. Small fat containing umbilical hernia. The abdominal wall soft tissues are otherwise unremarkable. There bilateral L5 pars defects with grade I  L5-S1 anterolisthesis. Stable appearing mild to T11 superior endplate compression. No acute fracture identified. IMPRESSION: No acute intra-abdominal pelvic pathology. Electronically Signed   By: Elgie Collard M.D.   On: 12/18/2015 19:04   I have personally reviewed and evaluated these images and lab results as part of my medical decision-making.   EKG Interpretation   Date/Time:  Wednesday December 18 2015 15:14:14 EST Ventricular Rate:  73 PR Interval:  151 QRS Duration: 111 QT Interval:  431 QTC Calculation: 475 R Axis:   68 Text Interpretation:  Sinus rhythm Low voltage, extremity and precordial  leads Baseline wander in lead(s) I II aVR V2 No significant change since  last tracing Confirmed by YAO  MD, DAVID ([id number]) on 12/18/2015 5:42:28 PM      MDM   Final diagnoses:  Hyponatremia  Generalized abdominal pain    Patient with hx of COPD, SOB, alcohol abuse presents with 4 week history of diarrhea and abdominal pain.  She is also complaining of CP and SOB. Patient is an alcoholic and states her last drink was 9PM yesterday.  She reports drinking 4 quarts of vodka/day.  No N/V, unilateral leg swelling.  VSS, she appears agitated.  On exam, heart RRR, lungs CTAB, abdomen mildly distended with diffuse tenderness, no rebound or guarding.  Ethanol 146 UA negative. UDS negative.  Troponin 0.00.  CXR negative.  EKG shows SR, no significant changes.  She is not hypoxic and vitals stable, doubt PE. CMP significant for Na 124, Cl 92. CT abdomen/pelvis negative for acute intra-abdominal pathology.  Patient admitted for hyponatremia.  Urine sodium and urine osmolality pending.  Fluids discontinued.  Case has been discussed with and seen by Dr. Silverio Lay who agrees with the above plan for admission.      Cheri Fowler, PA-C 12/18/15 1931  Cheri Fowler, PA-C 12/18/15 1935  Richardean Canal, MD 12/18/15 [phone]

## 2015-12-18 NOTE — ED Notes (Signed)
Pt removed IV despite numerous attempts to convince against this; MD notified.

## 2015-12-18 NOTE — ED Notes (Signed)
Patient transported to X-ray 

## 2015-12-18 NOTE — ED Notes (Signed)
PA at bedside.

## 2015-12-18 NOTE — ED Notes (Signed)
Pt stated they are unable to give a urine sample at this time.

## 2015-12-18 NOTE — Progress Notes (Signed)
ED CM confirmed with pt she has no pcp States she saw Dr Ihor [facility]nodi but [facility] Pt without insurance listed  Confirmed at [phone] that pt is not in the new data system since Nnodi left and has not been in the office  CM spoke with [place] who [place].  CM discussed and provided written information for uninsured accepting pcps, discussed the importance of pcp vs EDP services for f/u care, www.needymeds.org, www.goodrx.com, discounted pharmacies and other [place] resources such as [facility] , [facility], affordable care act, financial assistance, uninsured dental services, [place] med assist, DSS and  health department  Reviewed resources for [place] uninsured accepting pcps like [facility], [facility] medicine [facility] [address], [facility], [facility], local urgent care centers, [facility], [facility], general medical clinics, family services of the [facility], [facility] urgent care plus others, medication resources, [facility] out patient pharmacies and housing Pt voiced understanding and appreciation of resources provided   Provided [facility] contact information Pt agreed to a referral Cm completed referral Pt to be contact by [facility] clinical liaison  CM assisted with connecting pt to [phone]

## 2015-12-18 NOTE — ED Notes (Signed)
Team at bedside.

## 2015-12-18 NOTE — ED Notes (Signed)
Pt comes to Ed via ems, distended abdomen for past 4 weeks, left upper quad pain tender to palpation, v/s in normal ranges, has had 4 weeks of diarrhea, and hx of chronic alcoholism. No N/V  Noted. Allergy none known, chronic hx of COPD, lungs are clear, no SOB,

## 2015-12-19 LAB — BASIC METABOLIC PANEL
Anion gap: 10 (ref 5–15)
Anion gap: 10 (ref 5–15)
Anion gap: 11 (ref 5–15)
BUN: 7 mg/dL (ref 6–20)
BUN: 8 mg/dL (ref 6–20)
BUN: 9 mg/dL (ref 6–20)
CHLORIDE: 102 mmol/L (ref 101–111)
CHLORIDE: 100 mmol/L — AB (ref 101–111)
CO2: 22 mmol/L (ref 22–32)
CO2: 23 mmol/L (ref 22–32)
CO2: 24 mmol/L (ref 22–32)
Calcium: 8.4 mg/dL — ABNORMAL LOW (ref 8.9–10.3)
Calcium: 8.6 mg/dL — ABNORMAL LOW (ref 8.9–10.3)
Calcium: 8.7 mg/dL — ABNORMAL LOW (ref 8.9–10.3)
Chloride: 102 mmol/L (ref 101–111)
Creatinine, Ser: 0.54 mg/dL (ref 0.44–1.00)
Creatinine, Ser: 0.57 mg/dL (ref 0.44–1.00)
Creatinine, Ser: 0.57 mg/dL (ref 0.44–1.00)
GFR calc Af Amer: >60 mL/min (ref >60)
GFR calc Af Amer: >60 mL/min (ref >60)
GFR calc Af Amer: >60 mL/min (ref >60)
GFR calc non Af Amer: >60 mL/min (ref >60)
GLUCOSE: 101 mg/dL — AB (ref 65–99)
GLUCOSE: 106 mg/dL — AB (ref 65–99)
GLUCOSE: 131 mg/dL — AB (ref 65–99)
POTASSIUM: 3.7 mmol/L (ref 3.5–5.1)
POTASSIUM: 3.7 mmol/L (ref 3.5–5.1)
POTASSIUM: 3.8 mmol/L (ref 3.5–5.1)
Sodium: 134 mmol/L — ABNORMAL LOW (ref 135–145)
Sodium: 134 mmol/L — ABNORMAL LOW (ref 135–145)
Sodium: 136 mmol/L (ref 135–145)

## 2015-12-19 LAB — HEPATIC FUNCTION PANEL
ALT: 22 U/L (ref 14–54)
AST: 30 U/L (ref 15–41)
Albumin: 3.5 g/dL (ref 3.5–5.0)
Alkaline Phosphatase: 64 U/L (ref 38–126)
BILIRUBIN TOTAL: 0.3 mg/dL (ref 0.3–1.2)
Total Protein: 6.7 g/dL (ref 6.5–8.1)

## 2015-12-19 LAB — TROPONIN I: Troponin I: <0.03 ng/mL (ref <0.031)

## 2015-12-19 LAB — CBC
HEMATOCRIT: 30.4 % — AB (ref 36.0–46.0)
HEMOGLOBIN: 10.3 g/dL — AB (ref 12.0–15.0)
MCH: 29.4 pg (ref 26.0–34.0)
MCHC: 33.9 g/dL (ref 30.0–36.0)
MCV: 86.9 fL (ref 78.0–100.0)
Platelets: 277 10*3/uL (ref 150–400)
RBC: 3.5 MIL/uL — AB (ref 3.87–5.11)
RDW: 19 % — ABNORMAL HIGH (ref 11.5–15.5)
WBC: 7.4 10*3/uL (ref 4.0–10.5)

## 2015-12-19 LAB — C DIFFICILE QUICK SCREEN W PCR REFLEX
C Diff antigen: POSITIVE — AB
C Diff interpretation: POSITIVE
C Diff toxin: POSITIVE — AB

## 2015-12-19 LAB — MRSA PCR SCREENING: MRSA by PCR: NEGATIVE

## 2015-12-19 MED ORDER — VANCOMYCIN 50 MG/ML ORAL SOLUTION
125.0000 mg | Freq: Four times a day (QID) | ORAL | Status: DC
  Administered 2015-12-19 – 2015-12-20 (×3): 125 mg via ORAL
  Filled 2015-12-19 (×7): qty 2.5

## 2015-12-19 MED ORDER — OXYCODONE HCL 5 MG PO TABS
5.0000 mg | ORAL_TABLET | ORAL | Status: AC | PRN
  Administered 2015-12-19 – 2015-12-20 (×2): 5 mg via ORAL
  Filled 2015-12-19 (×2): qty 1

## 2015-12-19 MED ORDER — GUAIFENESIN-DM 100-10 MG/5ML PO SYRP
5.0000 mL | ORAL_SOLUTION | Freq: Once | ORAL | Status: AC
  Administered 2015-12-19: 5 mL via ORAL
  Filled 2015-12-19: qty 10

## 2015-12-19 NOTE — Progress Notes (Signed)
Rhonda Mitchell RUE:[id number] DOB: [birth year] DOA: 12/18/2015 PCP: No primary care provider on file.  Brief narrative: 54 y/o ? 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 Chr daily drinker States diarrhea worsens when she drinks No cp n/v  now Took in liquid meals without issue   Objective    Interim History:   Telemetry: Sinus tach   Objective: Filed Vitals:   12/18/15 2102 12/18/15 2156 12/18/15 2358 12/19/15 0534  BP: 175/110 192/84 144/75 131/85  Pulse: 81 84  85  Temp:  98.1 F (36.7 C)  98.2 F (36.8 C)  TempSrc:  Oral  Oral  Resp: 18   18  Height:  5' (1.524 m)    Weight:  54.6 kg (120 lb 5.9 oz)    SpO2: 95% 94%  91%    Intake/Output Summary (Last 24 hours) at 12/19/15 1018 Last data filed at 12/19/15 1478  Gross per 24 hour  Intake      0 ml  Output    400 ml  Net   -400 ml    Exam:  General: eomi dishevelled in NAD, tired looking Cardiovascular: s1 s 2no m/r/g Respiratory: clear no added soudn Abdomen:  abd slightly distedned without rebound nor gaurding Skin no le edema-multiple tattoos Neuro intact no tremor  Data Reviewed: Basic Metabolic Panel:  Recent Labs Lab 12/18/15 1546 12/18/15 2350 12/19/15 0429  NA 124* 134* 136  K 3.9 3.8 3.7  CL 92* 102 102  CO2 18* 22 24  GLUCOSE 106* 101* 106*  BUN CREATININE 0.57 0.57 0.54  CALCIUM 8.3* 8.4* 8.6*   Liver Function Tests:  Recent Labs Lab 12/18/15 1546 12/19/15 0429  AST 36 30  ALT 24 22  ALKPHOS 72 64  BILITOT <0.1* 0.3  PROT 7.5 6.7  ALBUMIN 3.8 3.5    Recent Labs Lab 12/18/15 1546  LIPASE 46      Recent Labs Lab 12/18/15 1719  AMMONIA 33   CBC:  Recent Labs Lab 12/18/15 1719 12/18/15 2350  WBC 8.7 7.4  NEUTROABS 6.9  --   HGB 10.6* 10.3*  HCT 31.9* 30.4*  MCV 87.2 86.9  PLT 292 277   Cardiac Enzymes:  Recent Labs Lab 12/18/15 2350 12/19/15 0429  TROPONINI <0.03 <0.03   BNP: Invalid input(s): POCBNP CBG: No results for input(s): GLUCAP in the last 168 hours.  Recent Results (from the past 240 hour(s))  MRSA PCR Screening     Status: None   Collection Time: 12/19/15 12:01 AM  Result Value Ref Range Status   MRSA by PCR NEGATIVE NEGATIVE Final    Comment:        The GeneXpert MRSA Assay (FDA approved for NASAL specimens only), is one component of a comprehensive MRSA colonization surveillance program. It is not intended to diagnose MRSA infection nor to guide or monitor treatment for MRSA infections.      Studies:              All Imaging reviewed and is as per above notation   Scheduled Meds: . DULoxetine  30 mg Oral Daily  .  enoxaparin (LOVENOX) injection  40 mg Subcutaneous QHS  . folic acid  1 mg Oral Daily  . gabapentin  100 mg Oral TID  . haloperidol  2 mg Oral BID  . LORazepam  0-4 mg Intravenous Q6H   Followed by  . [START ON 12/20/2015] LORazepam  0-4 mg Intravenous Q12H  . LORazepam  1 mg Intravenous Once  . multivitamin with minerals  1 tablet Oral Daily  . pantoprazole (PROTONIX) IV  40 mg Intravenous QHS  . sodium chloride  3 mL Intravenous Q12H  . thiamine  100 mg Oral Daily   Or  . thiamine  100 mg Intravenous Daily   Continuous Infusions:    Assessment/Plan:  Abdominal pain or diarrhea - most likely alcohol related with alcoholic gastritis. Patient has been placed on PPI. Diarrhea could be related to alcoholism, await stooo,studies Hyponatremia - given the elevated urine osmolarity and urine sodium patient probably has hyponatremia from alcoholism. sodiujm 124-134 and essentially resolved.  Was 2/2 to ETOH Alcohol  abuse with anion gap metabolic acidosis - patient has been placed on CIWA protocol with Ativan and thiamine. Social work consult. Elevated blood pressure - probably from pain and withdrawal. Patient has been placed on when necessary IV hydralazine. Closely follow blood pressure trends.  D/c tele. May shower History of hepatitis C - further management as outpatient.  Unlikely would be candidate unless cans top drinking COPD presently not wheezing. History of bipolar - continue present medications. Chronic anemia - follow CBC.   Grad to regular diet If tolerated would d/c in am  Pleas KochJai Avagail Whittlesey, MD  [facility] Pager [phone] 12/19/2015, 10:18 AM    LOS: 1 day

## 2015-12-19 NOTE — Progress Notes (Signed)
Postive CDiff toxin and antigen call from lab. MD made aware, will start protocol. Rhonda Mitchell. Rhonda Havey RN

## 2015-12-20 LAB — PROTIME-INR
INR: 0.91 (ref 0.00–1.49)
PROTHROMBIN TIME: 12.5 s (ref 11.6–15.2)

## 2015-12-20 LAB — COMPREHENSIVE METABOLIC PANEL
ALK PHOS: 75 U/L (ref 38–126)
ALT: 21 U/L (ref 14–54)
AST: 42 U/L — AB (ref 15–41)
Albumin: 3.8 g/dL (ref 3.5–5.0)
Anion gap: 11 (ref 5–15)
BILIRUBIN TOTAL: 0.9 mg/dL (ref 0.3–1.2)
BUN: 7 mg/dL (ref 6–20)
CALCIUM: 9.2 mg/dL (ref 8.9–10.3)
CHLORIDE: 100 mmol/L — AB (ref 101–111)
CO2: 27 mmol/L (ref 22–32)
CREATININE: 0.52 mg/dL (ref 0.44–1.00)
Glucose, Bld: 95 mg/dL (ref 65–99)
Potassium: 3.8 mmol/L (ref 3.5–5.1)
Sodium: 138 mmol/L (ref 135–145)
Total Protein: 7 g/dL (ref 6.5–8.1)

## 2015-12-20 LAB — CBC
HEMATOCRIT: 31 % — AB (ref 36.0–46.0)
HEMOGLOBIN: 9.8 g/dL — AB (ref 12.0–15.0)
MCH: 29 pg (ref 26.0–34.0)
MCHC: 31.6 g/dL (ref 30.0–36.0)
MCV: 91.7 fL (ref 78.0–100.0)
Platelets: 231 10*3/uL (ref 150–400)
RBC: 3.38 MIL/uL — AB (ref 3.87–5.11)
RDW: 19.2 % — ABNORMAL HIGH (ref 11.5–15.5)
WBC: 5.7 10*3/uL (ref 4.0–10.5)

## 2015-12-20 MED ORDER — VANCOMYCIN 50 MG/ML ORAL SOLUTION: 125.0000 mg | Freq: Four times a day (QID) | ORAL | Status: DC

## 2015-12-20 NOTE — Discharge Summary (Signed)
Physician Discharge Summary  Rhonda GillesMichelle Mitchell WUJ:[id number] DOB: 1961-11-24 DOA: 12/18/2015  PCP: No primary care provider on file.  Admit date: 12/18/2015 Discharge date: 12/20/2015  Time spent: 22 minutes  Recommendations for Outpatient Follow-up:  1. Complete 10 days vancomycin  2. Cbc + diff as OP 1 week for an update about quite     Discharge Diagnoses:  Principal Problem:   Abdominal pain, vomiting, and diarrhea Active Problems:   COPD (chronic obstructive pulmonary disease) (HCC)   Hyponatremia   Chronic hepatitis C without hepatic coma (HCC)   Severe protein-calorie malnutrition (HCC)   Alcohol abuse   Abdominal pain   Elevated blood pressure   Discharge Condition: gaurded  Diet recommendation: reg  Filed Weights   12/18/15 2156  Weight: 54.6 kg (120 lb 5.9 oz)    History of present illness:   54 y/o ? Discharge from [facility] recently after requesting detox-previous drug use noted COPD Hep C Severe PEM Macrocytic anemia Prior infected R elbow s/p surgery 04/2015  The patient was admitted with nausea vomiting abdominal pain and ultimately found to have C. difficile colitis She was started on by mouth vancomycin and was insistent to be discharged on 12/30 as she had a trip to take I counseled her regarding staying in the hospital but as her stools frequency had decreased and she was feeling fair with a leukocytosis fever or chills felt was reasonable to discharge her with vancomycin by mouth  She was hemodynamically stable on discharge   Discharge Exam: Filed Vitals:   12/19/15 2305 12/20/15 0500  BP: 161/91 140/65  Pulse:  81  Temp:  97.7 F (36.5 C)  Resp:  22    General: EOMI NCAT Cardiovascular: S1-S2 no murmur rub or gallop Respiratory:   clinically clear no added sound  Discharge Instructions   Discharge Instructions    Diet - low sodium heart healthy    Complete by:  As directed      Discharge instructions    Complete by:   As directed   You have an infectious diarrhea called C. difficile for which is caused by taking acid reducers and also is caused by recent antibiotic use I'm prescribing a medicine called vancomycin which she should take 4 times a day for the next 10 days and have give UA U prescription Please follow-up with her primary physician for further management     Increase activity slowly    Complete by:  As directed           Current Discharge Medication List    START taking these medications   Details  vancomycin (VANCOCIN) 50 mg/mL oral solution Take 2.5 mLs (125 mg total) by mouth 4 (four) times daily. Qty: 40 mL, Refills: 0      CONTINUE these medications which have NOT CHANGED   Details  albuterol (PROVENTIL HFA;VENTOLIN HFA) 108 (90 BASE) MCG/ACT inhaler Inhale 2 puffs into the lungs every 6 (six) hours as needed for wheezing or shortness of breath. Qty: 1 Inhaler, Refills: 2    benztropine (COGENTIN) 0.5 MG tablet Take 1 tablet (0.5 mg total) by mouth 2 (two) times daily as needed for tremors (EPS). Qty: 60 tablet, Refills: 0    DULoxetine (CYMBALTA) 30 MG capsule Take 1 capsule (30 mg total) by mouth daily. Qty: 30 capsule, Refills: 0    gabapentin (NEURONTIN) 100 MG capsule Take 1 capsule (100 mg total) by mouth 3 (three) times daily. Qty: 90 capsule, Refills: 0  haloperidol (HALDOL) 2 MG tablet Take 1 tablet (2 mg total) by mouth 2 (two) times daily. Qty: 60 tablet, Refills: 0    ibuprofen (ADVIL,MOTRIN) 200 MG tablet Take 400 mg by mouth every 6 (six) hours as needed for moderate pain.    thiamine 100 MG tablet Take 1 tablet (100 mg total) by mouth daily. Qty: 30 tablet, Refills: 0    traZODone (DESYREL) 50 MG tablet Take 1 tablet (50 mg total) by mouth at bedtime as needed for sleep. Qty: 30 tablet, Refills: 0    nicotine (NICODERM CQ - DOSED IN MG/24 HOURS) 21 mg/24hr patch Place 1 patch (21 mg total) onto the skin daily. Qty: 28 patch, Refills: 0      STOP  taking these medications     Pseudoephedrine-APAP-DM (DAYQUIL MULTI-SYMPTOM COLD/FLU PO)        No Known Allergies    The results of significant diagnostics from this hospitalization (including imaging, microbiology, ancillary and laboratory) are listed below for reference.    Significant Diagnostic Studies: Dg Chest 2 View  12/18/2015  CLINICAL DATA:  Cough for 4 weeks. COPD, smoker, shortness of Breath EXAM: CHEST  2 VIEW COMPARISON:  11/29/2015 FINDINGS: The heart size and mediastinal contours are within normal limits. Both lungs are clear. The visualized skeletal structures are unremarkable. IMPRESSION: No active cardiopulmonary disease. Electronically Signed   By: Charlett Nose M.D.   On: 12/18/2015 16:19   Dg Chest 2 View  11/29/2015  CLINICAL DATA:  Right side chest pain EXAM: CHEST  2 VIEW COMPARISON:  10/26/2015 FINDINGS: Cardiomediastinal silhouette is stable. No acute infiltrate or pleural effusion. No pulmonary edema. Bony thorax is unremarkable. IMPRESSION: No active cardiopulmonary disease. Electronically Signed   By: Natasha Mead M.D.   On: 11/29/2015 16:11   Ct Head Wo Contrast  11/29/2015  CLINICAL DATA:  Excessive alcohol intake. Mental status changes and combative. EXAM: CT HEAD WITHOUT CONTRAST TECHNIQUE: Contiguous axial images were obtained from the base of the skull through the vertex without intravenous contrast. COMPARISON:  Head CT 11/13/2015 and brain MRI 11/14/2015 FINDINGS: Stable age advanced cerebral atrophy and ventriculomegaly. No acute intracranial findings or mass lesions. The bony structures are intact. IMPRESSION: Stable age advanced cerebral atrophy. No acute intracranial findings. Electronically Signed   By: Rudie Meyer M.D.   On: 11/29/2015 16:07   Ct Abdomen Pelvis W Contrast  12/18/2015  CLINICAL DATA:  54 year old female with abdominal pain and distention. EXAM: CT ABDOMEN AND PELVIS WITH CONTRAST TECHNIQUE: Multidetector CT imaging of the abdomen  and pelvis was performed using the standard protocol following bolus administration of intravenous contrast. CONTRAST:  80mL OMNIPAQUE IOHEXOL 300 MG/ML SOLN, 80mL OMNIPAQUE IOHEXOL 300 MG/ML SOLN COMPARISON:  CT dated 04/23/2014 FINDINGS: The visualized lung bases are clear. No intra-abdominal free air or free fluid. The liver, gallbladder, pancreas, spleen, and adrenal glands appear unremarkable. Subcentimeter stable left renal hypodense lesion is not well characterized but likely represents a cyst. There is no hydronephrosis on either side. The visualized ureters and urinary bladder appear unremarkable. The uterus and the ovaries are grossly unremarkable. There scattered colonic diverticula without active inflammation. There is no evidence of bowel obstruction or inflammation. Normal appendix. Mild aortoiliac atherosclerotic disease. The origins of the celiac axis, SMA, IMA as well as the origins of the renal arteries are patent. No portal venous gas identified. There is no lymphadenopathy. Small fat containing umbilical hernia. The abdominal wall soft tissues are otherwise unremarkable. 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 Stable appearing mild to T11 superior endplate compression. No acute fracture identified. IMPRESSION: No acute intra-abdominal pelvic pathology. Electronically Signed   By: Elgie Collard M.D.   On: 12/18/2015 19:04    Microbiology: Recent Results (from the past 240 hour(s))  MRSA PCR Screening     Status: None   Collection Time: 12/19/15 12:01 AM  Result Value Ref Range Status   MRSA by PCR NEGATIVE NEGATIVE Final    Comment:        The GeneXpert MRSA Assay (FDA approved for NASAL specimens only), is one component of a comprehensive MRSA colonization surveillance program. It is not intended to diagnose MRSA infection nor to guide or monitor treatment for MRSA infections.   C difficile quick scan w PCR reflex     Status: Abnormal    Collection Time: 12/19/15 12:18 PM  Result Value Ref Range Status   C Diff antigen POSITIVE (A) NEGATIVE Final   C Diff toxin POSITIVE (A) NEGATIVE Final   C Diff interpretation Positive for toxigenic C. difficile  Final    Comment: CRITICAL RESULT CALLED TO, READ BACK BY AND VERIFIED WITH: GURLEY,K @ 1611 ON [id number] BY POTEAT,S      Labs: Basic Metabolic Panel:  Recent Labs Lab 12/18/15 1546 12/18/15 2350 12/19/15 0429 12/19/15 1030 12/20/15 0528  NA 124* 134* 136 134* 138  K 3.9 3.8 3.7 3.7 3.8  CL 92* 102 102 100* 100*  CO2 18* GLUCOSE 106* 101* 106* 131* 95  BUN CREATININE 0.57 0.57 0.54 0.57 0.52  CALCIUM 8.3* 8.4* 8.6* 8.7* 9.2   Liver Function Tests:  Recent Labs Lab 12/18/15 1546 12/19/15 0429 12/20/15 0528  AST 36 30 42*  ALT ALKPHOS 72 64 75  BILITOT <0.1* 0.3 0.9  PROT 7.5 6.7 7.0  ALBUMIN 3.8 3.5 3.8    Recent Labs Lab 12/18/15 1546  LIPASE 46    Recent Labs Lab 12/18/15 1719  AMMONIA 33   CBC:  Recent Labs Lab 12/18/15 1719 12/18/15 2350 12/20/15 0528  WBC 8.7 7.4 5.7  NEUTROABS 6.9  --   --   HGB 10.6* 10.3* 9.8*  HCT 31.9* 30.4* 31.0*  MCV 87.2 86.9 91.7  PLT 292 277 231   Cardiac Enzymes:  Recent Labs Lab 12/18/15 2350 12/19/15 0429 12/19/15 1030  TROPONINI <0.03 <0.03 <0.03   BNP: BNP (last 3 results)  Recent Labs  03/18/15 2109 08/03/15 1820  BNP 234.0* 55.1    ProBNP (last 3 results) No results for input(s): PROBNP in the last 8760 hours.  CBG: No results for input(s): GLUCAP in the last 168 hours.     SignedRhetta Mura MD   [facility] 12/20/2015, 10:36 AM

## 2015-12-22 ENCOUNTER — Emergency Department (HOSPITAL_COMMUNITY)
Admission: EM | Admit: 2015-12-22 | Discharge: 2015-12-22 | Discharge status: Against Medical Advice | Payer: Medicaid Other | Attending: Emergency Medicine | Admitting: Emergency Medicine

## 2015-12-22 ENCOUNTER — Encounter (HOSPITAL_COMMUNITY): Payer: Self-pay | Admitting: Emergency Medicine

## 2015-12-22 DIAGNOSIS — F17210 Nicotine dependence, cigarettes, uncomplicated: Secondary | ICD-10-CM | POA: Diagnosis not present

## 2015-12-22 DIAGNOSIS — J449 Chronic obstructive pulmonary disease, unspecified: Secondary | ICD-10-CM | POA: Insufficient documentation

## 2015-12-22 DIAGNOSIS — F10129 Alcohol abuse with intoxication, unspecified: Secondary | ICD-10-CM | POA: Diagnosis not present

## 2015-12-22 NOTE — ED Notes (Signed)
Patient did not answer in the lobby.

## 2015-12-22 NOTE — ED Notes (Signed)
Pt called to take to treatment room  No response from lobby 

## 2015-12-22 NOTE — ED Notes (Signed)
Pt came back after she had been discharged. States that she still wants to be seen.

## 2015-12-22 NOTE — ED Notes (Signed)
Pt called out EMS this morning after she was shaking from not having alcohol in reportedly 3 days. Drank 4 beers today after the shaking began. Denies SI/HI. Wants help with detox. Alert and oriented.

## 2016-02-19 ENCOUNTER — Emergency Department (HOSPITAL_COMMUNITY): Payer: Medicaid Other

## 2016-02-19 ENCOUNTER — Encounter (HOSPITAL_COMMUNITY): Payer: Self-pay | Admitting: *Deleted

## 2016-02-19 ENCOUNTER — Inpatient Hospital Stay (HOSPITAL_COMMUNITY)
Admission: EM | Admit: 2016-02-19 | Discharge: 2016-03-09 | DRG: 897 | Disposition: A | Discharge status: Home Health | Payer: Medicaid Other | Attending: Internal Medicine | Admitting: Internal Medicine

## 2016-02-19 DIAGNOSIS — F10939 Alcohol use, unspecified with withdrawal, unspecified: Secondary | ICD-10-CM | POA: Diagnosis present

## 2016-02-19 DIAGNOSIS — Z8614 Personal history of Methicillin resistant Staphylococcus aureus infection: Secondary | ICD-10-CM

## 2016-02-19 DIAGNOSIS — F32A Depression, unspecified: Secondary | ICD-10-CM | POA: Insufficient documentation

## 2016-02-19 DIAGNOSIS — L8992 Pressure ulcer of unspecified site, stage 2: Secondary | ICD-10-CM | POA: Diagnosis present

## 2016-02-19 DIAGNOSIS — E876 Hypokalemia: Secondary | ICD-10-CM | POA: Diagnosis not present

## 2016-02-19 DIAGNOSIS — E860 Dehydration: Secondary | ICD-10-CM | POA: Diagnosis present

## 2016-02-19 DIAGNOSIS — R1110 Vomiting, unspecified: Secondary | ICD-10-CM

## 2016-02-19 DIAGNOSIS — R269 Unspecified abnormalities of gait and mobility: Secondary | ICD-10-CM | POA: Insufficient documentation

## 2016-02-19 DIAGNOSIS — F10231 Alcohol dependence with withdrawal delirium: Principal | ICD-10-CM | POA: Diagnosis present

## 2016-02-19 DIAGNOSIS — J449 Chronic obstructive pulmonary disease, unspecified: Secondary | ICD-10-CM | POA: Diagnosis present

## 2016-02-19 DIAGNOSIS — Z79899 Other long term (current) drug therapy: Secondary | ICD-10-CM

## 2016-02-19 DIAGNOSIS — D649 Anemia, unspecified: Secondary | ICD-10-CM | POA: Diagnosis present

## 2016-02-19 DIAGNOSIS — N390 Urinary tract infection, site not specified: Secondary | ICD-10-CM | POA: Diagnosis present

## 2016-02-19 DIAGNOSIS — D6959 Other secondary thrombocytopenia: Secondary | ICD-10-CM | POA: Diagnosis not present

## 2016-02-19 DIAGNOSIS — F17210 Nicotine dependence, cigarettes, uncomplicated: Secondary | ICD-10-CM | POA: Diagnosis present

## 2016-02-19 DIAGNOSIS — D638 Anemia in other chronic diseases classified elsewhere: Secondary | ICD-10-CM | POA: Diagnosis present

## 2016-02-19 DIAGNOSIS — F10239 Alcohol dependence with withdrawal, unspecified: Secondary | ICD-10-CM

## 2016-02-19 DIAGNOSIS — Z803 Family history of malignant neoplasm of breast: Secondary | ICD-10-CM

## 2016-02-19 DIAGNOSIS — Z7951 Long term (current) use of inhaled steroids: Secondary | ICD-10-CM

## 2016-02-19 DIAGNOSIS — R0682 Tachypnea, not elsewhere classified: Secondary | ICD-10-CM | POA: Insufficient documentation

## 2016-02-19 DIAGNOSIS — B182 Chronic viral hepatitis C: Secondary | ICD-10-CM | POA: Diagnosis present

## 2016-02-19 DIAGNOSIS — F1010 Alcohol abuse, uncomplicated: Secondary | ICD-10-CM | POA: Diagnosis present

## 2016-02-19 DIAGNOSIS — R531 Weakness: Secondary | ICD-10-CM

## 2016-02-19 DIAGNOSIS — B9620 Unspecified Escherichia coli [E. coli] as the cause of diseases classified elsewhere: Secondary | ICD-10-CM | POA: Diagnosis present

## 2016-02-19 DIAGNOSIS — F1024 Alcohol dependence with alcohol-induced mood disorder: Secondary | ICD-10-CM | POA: Diagnosis present

## 2016-02-19 DIAGNOSIS — R29898 Other symptoms and signs involving the musculoskeletal system: Secondary | ICD-10-CM | POA: Insufficient documentation

## 2016-02-19 DIAGNOSIS — R5381 Other malaise: Secondary | ICD-10-CM | POA: Diagnosis present

## 2016-02-19 DIAGNOSIS — F419 Anxiety disorder, unspecified: Secondary | ICD-10-CM | POA: Diagnosis present

## 2016-02-19 DIAGNOSIS — M21371 Foot drop, right foot: Secondary | ICD-10-CM | POA: Diagnosis present

## 2016-02-19 DIAGNOSIS — R197 Diarrhea, unspecified: Secondary | ICD-10-CM

## 2016-02-19 DIAGNOSIS — F329 Major depressive disorder, single episode, unspecified: Secondary | ICD-10-CM | POA: Insufficient documentation

## 2016-02-19 DIAGNOSIS — E870 Hyperosmolality and hypernatremia: Secondary | ICD-10-CM | POA: Diagnosis present

## 2016-02-19 DIAGNOSIS — L8990 Pressure ulcer of unspecified site, unspecified stage: Secondary | ICD-10-CM | POA: Insufficient documentation

## 2016-02-19 DIAGNOSIS — R109 Unspecified abdominal pain: Secondary | ICD-10-CM | POA: Diagnosis present

## 2016-02-19 LAB — URINALYSIS, ROUTINE W REFLEX MICROSCOPIC
Glucose, UA: NEGATIVE mg/dL
Ketones, ur: NEGATIVE mg/dL
NITRITE: POSITIVE — AB
PROTEIN: 30 mg/dL — AB
Specific Gravity, Urine: 1.025 (ref 1.005–1.030)
pH: 6 (ref 5.0–8.0)

## 2016-02-19 LAB — COMPREHENSIVE METABOLIC PANEL
ALT: 46 U/L (ref 14–54)
AST: 183 U/L — ABNORMAL HIGH (ref 15–41)
Albumin: 3.2 g/dL — ABNORMAL LOW (ref 3.5–5.0)
Alkaline Phosphatase: 70 U/L (ref 38–126)
Anion gap: 15 (ref 5–15)
BUN: 6 mg/dL (ref 6–20)
CHLORIDE: 108 mmol/L (ref 101–111)
CO2: 25 mmol/L (ref 22–32)
CREATININE: 0.82 mg/dL (ref 0.44–1.00)
Calcium: 8 mg/dL — ABNORMAL LOW (ref 8.9–10.3)
Glucose, Bld: 132 mg/dL — ABNORMAL HIGH (ref 65–99)
Potassium: 4.7 mmol/L (ref 3.5–5.1)
Sodium: 148 mmol/L — ABNORMAL HIGH (ref 135–145)
TOTAL PROTEIN: 6.7 g/dL (ref 6.5–8.1)
Total Bilirubin: 0.4 mg/dL (ref 0.3–1.2)

## 2016-02-19 LAB — URINE MICROSCOPIC-ADD ON: RBC / HPF: NONE SEEN RBC/hpf (ref 0–5)

## 2016-02-19 LAB — CBC
HCT: 29.9 % — ABNORMAL LOW (ref 36.0–46.0)
Hemoglobin: 9.3 g/dL — ABNORMAL LOW (ref 12.0–15.0)
MCH: 29.2 pg (ref 26.0–34.0)
MCHC: 31.1 g/dL (ref 30.0–36.0)
MCV: 94 fL (ref 78.0–100.0)
PLATELETS: 155 10*3/uL (ref 150–400)
RBC: 3.18 MIL/uL — AB (ref 3.87–5.11)
RDW: 22.4 % — ABNORMAL HIGH (ref 11.5–15.5)
WBC: 6.5 10*3/uL (ref 4.0–10.5)

## 2016-02-19 LAB — LIPASE, BLOOD: LIPASE: 85 U/L — AB (ref 11–51)

## 2016-02-19 MED ORDER — LORAZEPAM 2 MG/ML IJ SOLN: 0.0000 mg | Freq: Two times a day (BID) | INTRAMUSCULAR | Status: DC

## 2016-02-19 MED ORDER — SODIUM CHLORIDE 0.9 % IV BOLUS (SEPSIS)
1000.0000 mL | Freq: Once | INTRAVENOUS | Status: AC
  Administered 2016-02-20: 1000 mL via INTRAVENOUS

## 2016-02-19 MED ORDER — VITAMIN B-1 100 MG PO TABS
100.0000 mg | ORAL_TABLET | Freq: Every day | ORAL | Status: DC
  Administered 2016-02-21 – 2016-02-27 (×7): 100 mg via ORAL
  Filled 2016-02-19 (×7): qty 1

## 2016-02-19 MED ORDER — LORAZEPAM 2 MG/ML IJ SOLN
0.0000 mg | Freq: Four times a day (QID) | INTRAMUSCULAR | Status: DC
  Administered 2016-02-20: 2 mg via INTRAVENOUS
  Administered 2016-02-20: 1 mg via INTRAVENOUS
  Administered 2016-02-20: 2 mg via INTRAVENOUS
  Filled 2016-02-19 (×3): qty 1

## 2016-02-19 MED ORDER — THIAMINE HCL 100 MG/ML IJ SOLN
100.0000 mg | Freq: Every day | INTRAMUSCULAR | Status: DC
  Administered 2016-02-20 (×2): 100 mg via INTRAVENOUS
  Filled 2016-02-19 (×2): qty 2

## 2016-02-19 MED ORDER — LORAZEPAM 2 MG/ML IJ SOLN
1.0000 mg | Freq: Once | INTRAMUSCULAR | Status: AC
  Administered 2016-02-20: 1 mg via INTRAVENOUS
  Filled 2016-02-19: qty 1

## 2016-02-19 MED ORDER — THIAMINE HCL 100 MG/ML IJ SOLN
Freq: Once | INTRAVENOUS | Status: AC
  Administered 2016-02-20: via INTRAVENOUS
  Filled 2016-02-19: qty 1000

## 2016-02-19 NOTE — ED Provider Notes (Signed)
CSN: [id number]     Arrival date & time 02/19/16  2043 History  By signing my name below, I, Marisue Humble, attest that this documentation has been prepared under the direction and in the presence of Shon Baton, MD . Electronically Signed: Marisue Humble, Scribe. 02/19/2016. 11:33 PM.     Chief Complaint  Patient presents with  . Abdominal Pain  . Alcohol Problem  . Shortness of Breath    The history is provided by the patient. No language interpreter was used.   HPI Comments:  Rhonda Mitchell is a 55 y.o. female with PMHx COPD and alcohol abuse brought in by EMS who presents to the Emergency Department complaining of moderate, gradual onset abdominal pain worsening a few hours ago. Pt reports associated hot flashes, diarrhea, vomiting, and shortness of breath. Pt states she hasn't slept in 1.5 weeks and hasn't eaten in 4 days due to alcohol abuse. No alleviating factors noted. She reports drinking 4 pints of liquor a day for the past 11 years. Pt states she needs help and wants to get better. Pt has been in rehab previously and notes she has had seizures while withdrawing from alcohol and while drinking alcohol. Pt denies SI or HI.  Past Medical History  Diagnosis Date  . COPD (chronic obstructive pulmonary disease) (HCC)   . Anxiety   . Shortness of breath   . Arthritis   . GERD (gastroesophageal reflux disease)   . Full dentures   . Insomnia   . Alcohol abuse   . Hepatitis C   . Alcohol abuse   . History of MRSA infection   . History of encephalopathy    Past Surgical History  Procedure Laterality Date  . Arm surgery    . Hand surgery  1990    both rt/lt carpal tunnels  . Shoulder surgery      right and left-age [age]  . Orif ulnar / radial shaft fracture  1990    right-got infection-had total 10 surgeies 1990  . Orif ulnar fracture Left 05/23/2014    Procedure: OPEN REDUCTION INTERNAL FIXATION (ORIF) LEFT ULNAR FRACTURE;  Surgeon: Harvie Junior, MD;  Location:  [facility];  Service: Orthopedics;  Laterality: Left;  . I&d extremity Right 05/07/2015    Procedure: IRRIGATION AND DEBRIDEMENT  ELBOW ;  Surgeon: Dominica Severin, MD;  Location: [facility] OR;  Service: Orthopedics;  Laterality: Right;   Family History  Problem Relation Age of Onset  . Breast cancer Mother    Social History  Substance Use Topics  . Smoking status: Current Every Day Smoker -- 1.00 packs/day for 20 years    Types: Cigarettes  . Smokeless tobacco: Never Used  . Alcohol Use: Yes     Comment: 1/5th Vodka every day    OB History    No data available     Review of Systems  Constitutional: Positive for appetite change.       Alcohol problem  Respiratory: Positive for shortness of breath.   Gastrointestinal: Positive for nausea, vomiting, abdominal pain and diarrhea.  Psychiatric/Behavioral: Positive for sleep disturbance. Negative for suicidal ideas.  All other systems reviewed and are negative.  Allergies  Review of patient's allergies indicates no known allergies.  Home Medications   Prior to Admission medications   Medication Sig Start Date End Date Taking? Authorizing Provider  albuterol (PROVENTIL HFA;VENTOLIN HFA) 108 (90 BASE) MCG/ACT inhaler Inhale 2 puffs into the lungs every 6 (six) hours as needed for wheezing  or shortness of breath. 08/28/15   Renae Fickle, MD  benztropine (COGENTIN) 0.5 MG tablet Take 1 tablet (0.5 mg total) by mouth 2 (two) times daily as needed for tremors (EPS). 11/05/15   Beau Fanny, FNP  budesonide-formoterol (SYMBICORT) 160-4.5 MCG/ACT inhaler Inhale 2 puffs into the lungs 2 (two) times daily.    Historical Provider, MD  DULoxetine (CYMBALTA) 30 MG capsule Take 1 capsule (30 mg total) by mouth daily. 11/05/15   Beau Fanny, FNP  gabapentin (NEURONTIN) 100 MG capsule Take 1 capsule (100 mg total) by mouth 3 (three) times daily. 11/05/15   Beau Fanny, FNP  haloperidol (HALDOL) 2 MG tablet Take 1 tablet (2 mg total)  by mouth 2 (two) times daily. 11/05/15   Beau Fanny, FNP  ibuprofen (ADVIL,MOTRIN) 200 MG tablet Take 400 mg by mouth every 6 (six) hours as needed for moderate pain.    Historical Provider, MD  nicotine (NICODERM CQ - DOSED IN MG/24 HOURS) 21 mg/24hr patch Place 1 patch (21 mg total) onto the skin daily. Patient not taking: Reported on 11/13/2015 11/05/15   Beau Fanny, FNP  thiamine 100 MG tablet Take 1 tablet (100 mg total) by mouth daily. 11/05/15   Beau Fanny, FNP  traZODone (DESYREL) 50 MG tablet Take 1 tablet (50 mg total) by mouth at bedtime as needed for sleep. 11/05/15   Beau Fanny, FNP  vancomycin (VANCOCIN) 50 mg/mL oral solution Take 2.5 mLs (125 mg total) by mouth 4 (four) times daily. 12/20/15   Rhetta Mura, MD   BP 177/93 mmHg  Pulse 95  Temp(Src) 97.4 F (36.3 C) (Axillary)  Resp 17  SpO2 99% Physical Exam  Constitutional: She is oriented to person, place, and time.  Appears older than stated age, disheveled  HENT:  Head: Normocephalic and atraumatic.  Eyes: Pupils are equal, round, and reactive to light.  Cardiovascular: Normal rate, regular rhythm and normal heart sounds.   No murmur heard. Pulmonary/Chest: Effort normal. No respiratory distress. She has wheezes. She has rales.  Abdominal: Soft. Bowel sounds are normal. There is no tenderness. There is no rebound.  Musculoskeletal: She exhibits no edema.  Neurological: She is alert and oriented to person, place, and time.  Tremulous  Skin: Skin is warm and dry.  Multiple excoriations over the face and back  Psychiatric:  Appears intoxicated, pressured speech  Nursing note and vitals reviewed.   ED Course  Procedures   CRITICAL CARE Performed by: Shon Baton   Total critical care time: 45 minutes  Critical care time was exclusive of separately billable procedures and treating other patients.  Critical care was necessary to treat or prevent imminent or life-threatening  deterioration.  Critical care was time spent personally by me on the following activities: development of treatment plan with patient and/or surrogate as well as nursing, discussions with consultants, evaluation of patient's response to treatment, examination of patient, obtaining history from patient or surrogate, ordering and performing treatments and interventions, ordering and review of laboratory studies, ordering and review of radiographic studies, pulse oximetry and re-evaluation of patient's condition.  DIAGNOSTIC STUDIES:  Oxygen Saturation is 100% on RA, normal by my interpretation.    COORDINATION OF CARE:  11:19 PM Will administer medication and order imaging. Discussed treatment plan with pt at bedside and pt agreed to plan.  Labs Review Labs Reviewed  LIPASE, BLOOD - Abnormal; Notable for the following:    Lipase 85 (*)    All  other components within normal limits  COMPREHENSIVE METABOLIC PANEL - Abnormal; Notable for the following:    Sodium 148 (*)    Glucose, Bld 132 (*)    Calcium 8.0 (*)    Albumin 3.2 (*)    AST 183 (*)    All other components within normal limits  CBC - Abnormal; Notable for the following:    RBC 3.18 (*)    Hemoglobin 9.3 (*)    HCT 29.9 (*)    RDW 22.4 (*)    All other components within normal limits  URINALYSIS, ROUTINE W REFLEX MICROSCOPIC (NOT AT [facility]) - Abnormal; Notable for the following:    APPearance CLOUDY (*)    Hgb urine dipstick TRACE (*)    Bilirubin Urine SMALL (*)    Protein, ur 30 (*)    Nitrite POSITIVE (*)    Leukocytes, UA MODERATE (*)    All other components within normal limits  URINE MICROSCOPIC-ADD ON - Abnormal; Notable for the following:    Squamous Epithelial / LPF 0-5 (*)    Bacteria, UA MANY (*)    All other components within normal limits  ETHANOL - Abnormal; Notable for the following:    Alcohol, Ethyl (B) 298 (*)    All other components within normal limits  URINE CULTURE  TROPONIN I  URINE RAPID DRUG  SCREEN, HOSP PERFORMED    Imaging Review Dg Abd Acute W/chest  02/19/2016  CLINICAL DATA:  55 year old female with abdominal pain EXAM: DG ABDOMEN ACUTE W/ 1V CHEST COMPARISON:  The CT dated 12/18/2015 FINDINGS: The lungs are clear. No pleural effusion or pneumothorax. The cardiac silhouette is within normal limits. There is no evidence of bowel obstruction or free air. No radiopaque calculi identified. IMPRESSION: No radiopaque foreign object. There is degenerative changes of the osseous structures. No acute fracture. Electronically Signed   By: Elgie Collard M.D.   On: 02/19/2016 23:42   I have personally reviewed and evaluated these images and lab results as part of my medical decision-making.   EKG Interpretation ED ECG REPORT   Date: 02/20/2016  Rate: 103  Rhythm: sinus tachycardia  QRS Axis: normal  Intervals: normal  ST/T Wave abnormalities: nonspecific ST changes  Conduction Disutrbances:none  Narrative Interpretation:   Old EKG Reviewed: sinus tach  I have personally reviewed the EKG tracing and agree with the computerized printout as noted.    Angiocath insertion Performed by: Shon Baton, MD  Consent: Verbal consent obtained. Risks and benefits: risks, benefits and alternatives were discussed Time out: Immediately prior to procedure a "time out" was called to verify the correct patient, procedure, equipment, support staff and site/side marked as required. Preparation: Patient was prepped and draped in the usual sterile fashion.  Vein Location: right basilic  Ultrasound Guided  Gauge: 20  Normal blood return and flush without difficulty Patient tolerance: Patient tolerated the procedure well with no immediate complications.    MDM   Final diagnoses:  Alcohol withdrawal, with unspecified complication (HCC)  UTI (lower urinary tract infection)    Patient presents requesting alcohol detox. Also reporting abdominal pain and reported shortness of breath  with EMS. She is tremulous on exam. Chronically ill-appearing. Vital signs initially reassuring. Basic labwork obtained in triage and reviewed. She does appear dehydrated with a sodium of 148. Patient was given a normal saline bolus and a banana bag.  Urinalysis with possible UTI. Urine culture sent patient given IV Rocephin. Alcohol level 298. This is not abnormal for this  patient. She states that she last drank alcohol approximately 8 hours ago.  She denies SI or HI.  Pt placed on CIWA.  3:04 AM On reassessment, patient is more tremulous. She has peed herself multiple times. She is progressively tachycardic. Initial CIWA score 13. She received 2 of Ativan. Repeat CIWA score 17 and she received a second dose of 2 mg of Ativan.  Patient appears to be having progressive symptoms of withdrawal. I believe the patient would benefit from medical admission given her rapid progression while in the ER.  I personally performed the services described in this documentation, which was scribed in my presence. The recorded information has been reviewed and is accurate.     Shon Baton, MD 02/20/16 [phone]

## 2016-02-19 NOTE — ED Notes (Signed)
EDP & Lab at bedside

## 2016-02-19 NOTE — ED Notes (Signed)
Pt called EMS for abd pain. Upon their arrival pt states that she drinks 3-4 pints liquor/day. Requesting detox. As EMS was walking pt to truck she became short of breath. Pt finishing breathing tx in trage. Total 10 albuterol and 1 atrovent given.  Denies SI/HI.

## 2016-02-19 NOTE — ED Notes (Signed)
Security at bedside

## 2016-02-19 NOTE — ED Notes (Signed)
Pt screaming at this RN, drinking out of the sink, demanding to see a doctor for pain medicine. Screaming and cussing in the triage hallway. Pt refusing to wear her oxygen.

## 2016-02-19 NOTE — ED Notes (Signed)
IV attempt X2 

## 2016-02-19 NOTE — ED Notes (Signed)
After breathing treatment pt is 85% on RA. Placed on 2L [place].

## 2016-02-20 DIAGNOSIS — F10231 Alcohol dependence with withdrawal delirium: Secondary | ICD-10-CM | POA: Diagnosis present

## 2016-02-20 DIAGNOSIS — F1010 Alcohol abuse, uncomplicated: Secondary | ICD-10-CM | POA: Diagnosis not present

## 2016-02-20 DIAGNOSIS — F10230 Alcohol dependence with withdrawal, uncomplicated: Secondary | ICD-10-CM | POA: Diagnosis not present

## 2016-02-20 DIAGNOSIS — F329 Major depressive disorder, single episode, unspecified: Secondary | ICD-10-CM | POA: Diagnosis present

## 2016-02-20 DIAGNOSIS — R1013 Epigastric pain: Secondary | ICD-10-CM | POA: Diagnosis present

## 2016-02-20 DIAGNOSIS — D649 Anemia, unspecified: Secondary | ICD-10-CM | POA: Diagnosis present

## 2016-02-20 DIAGNOSIS — Z803 Family history of malignant neoplasm of breast: Secondary | ICD-10-CM | POA: Diagnosis not present

## 2016-02-20 DIAGNOSIS — D638 Anemia in other chronic diseases classified elsewhere: Secondary | ICD-10-CM | POA: Diagnosis present

## 2016-02-20 DIAGNOSIS — B182 Chronic viral hepatitis C: Secondary | ICD-10-CM | POA: Diagnosis present

## 2016-02-20 DIAGNOSIS — N390 Urinary tract infection, site not specified: Secondary | ICD-10-CM | POA: Insufficient documentation

## 2016-02-20 DIAGNOSIS — J449 Chronic obstructive pulmonary disease, unspecified: Secondary | ICD-10-CM | POA: Diagnosis not present

## 2016-02-20 DIAGNOSIS — R1110 Vomiting, unspecified: Secondary | ICD-10-CM

## 2016-02-20 DIAGNOSIS — E876 Hypokalemia: Secondary | ICD-10-CM | POA: Diagnosis not present

## 2016-02-20 DIAGNOSIS — R109 Unspecified abdominal pain: Secondary | ICD-10-CM | POA: Diagnosis not present

## 2016-02-20 DIAGNOSIS — R29898 Other symptoms and signs involving the musculoskeletal system: Secondary | ICD-10-CM | POA: Diagnosis not present

## 2016-02-20 DIAGNOSIS — E860 Dehydration: Secondary | ICD-10-CM

## 2016-02-20 DIAGNOSIS — R197 Diarrhea, unspecified: Secondary | ICD-10-CM

## 2016-02-20 DIAGNOSIS — Z8614 Personal history of Methicillin resistant Staphylococcus aureus infection: Secondary | ICD-10-CM | POA: Diagnosis not present

## 2016-02-20 DIAGNOSIS — R269 Unspecified abnormalities of gait and mobility: Secondary | ICD-10-CM | POA: Diagnosis not present

## 2016-02-20 DIAGNOSIS — F419 Anxiety disorder, unspecified: Secondary | ICD-10-CM | POA: Diagnosis present

## 2016-02-20 DIAGNOSIS — Z79899 Other long term (current) drug therapy: Secondary | ICD-10-CM | POA: Diagnosis not present

## 2016-02-20 DIAGNOSIS — F1024 Alcohol dependence with alcohol-induced mood disorder: Secondary | ICD-10-CM | POA: Diagnosis not present

## 2016-02-20 DIAGNOSIS — E870 Hyperosmolality and hypernatremia: Secondary | ICD-10-CM

## 2016-02-20 DIAGNOSIS — Z7951 Long term (current) use of inhaled steroids: Secondary | ICD-10-CM | POA: Diagnosis not present

## 2016-02-20 DIAGNOSIS — L8992 Pressure ulcer of unspecified site, stage 2: Secondary | ICD-10-CM | POA: Diagnosis present

## 2016-02-20 DIAGNOSIS — B9620 Unspecified Escherichia coli [E. coli] as the cause of diseases classified elsewhere: Secondary | ICD-10-CM | POA: Diagnosis present

## 2016-02-20 DIAGNOSIS — R5381 Other malaise: Secondary | ICD-10-CM | POA: Diagnosis present

## 2016-02-20 DIAGNOSIS — M21371 Foot drop, right foot: Secondary | ICD-10-CM | POA: Diagnosis present

## 2016-02-20 DIAGNOSIS — F10239 Alcohol dependence with withdrawal, unspecified: Secondary | ICD-10-CM | POA: Diagnosis not present

## 2016-02-20 DIAGNOSIS — F17210 Nicotine dependence, cigarettes, uncomplicated: Secondary | ICD-10-CM | POA: Diagnosis present

## 2016-02-20 DIAGNOSIS — D6959 Other secondary thrombocytopenia: Secondary | ICD-10-CM | POA: Diagnosis not present

## 2016-02-20 LAB — BASIC METABOLIC PANEL
Anion gap: 14 (ref 5–15)
CO2: 22 mmol/L (ref 22–32)
CREATININE: 0.55 mg/dL (ref 0.44–1.00)
Calcium: 7.1 mg/dL — ABNORMAL LOW (ref 8.9–10.3)
Chloride: 105 mmol/L (ref 101–111)
GFR calc Af Amer: >60 mL/min (ref >60)
GFR calc non Af Amer: >60 mL/min (ref >60)
Glucose, Bld: 89 mg/dL (ref 65–99)
Potassium: 3.6 mmol/L (ref 3.5–5.1)
Sodium: 141 mmol/L (ref 135–145)

## 2016-02-20 LAB — RAPID URINE DRUG SCREEN, HOSP PERFORMED
AMPHETAMINES: NOT DETECTED
BENZODIAZEPINES: NOT DETECTED
Barbiturates: NOT DETECTED
COCAINE: NOT DETECTED
OPIATES: NOT DETECTED
TETRAHYDROCANNABINOL: NOT DETECTED

## 2016-02-20 LAB — CBC
HCT: 26.4 % — ABNORMAL LOW (ref 36.0–46.0)
Hemoglobin: 8.5 g/dL — ABNORMAL LOW (ref 12.0–15.0)
MCH: 30.6 pg (ref 26.0–34.0)
MCHC: 32.2 g/dL (ref 30.0–36.0)
MCV: 95 fL (ref 78.0–100.0)
PLATELETS: 99 10*3/uL — AB (ref 150–400)
RBC: 2.78 MIL/uL — AB (ref 3.87–5.11)
RDW: 22.2 % — AB (ref 11.5–15.5)
WBC: 8.1 10*3/uL (ref 4.0–10.5)

## 2016-02-20 LAB — MRSA PCR SCREENING: MRSA by PCR: NEGATIVE

## 2016-02-20 LAB — TROPONIN I: Troponin I: <0.03 ng/mL (ref <0.031)

## 2016-02-20 LAB — ETHANOL: Alcohol, Ethyl (B): 298 mg/dL — ABNORMAL HIGH (ref <5)

## 2016-02-20 MED ORDER — VANCOMYCIN 50 MG/ML ORAL SOLUTION
125.0000 mg | Freq: Four times a day (QID) | ORAL | Status: DC
  Filled 2016-02-20 (×2): qty 2.5

## 2016-02-20 MED ORDER — ONDANSETRON HCL 4 MG/2ML IJ SOLN
4.0000 mg | Freq: Four times a day (QID) | INTRAMUSCULAR | Status: DC | PRN
  Administered 2016-02-20 – 2016-03-09 (×5): 4 mg via INTRAVENOUS
  Filled 2016-02-20 (×5): qty 2

## 2016-02-20 MED ORDER — LORAZEPAM 2 MG/ML IJ SOLN
2.0000 mg | INTRAMUSCULAR | Status: DC
  Administered 2016-02-20 – 2016-02-21 (×5): 2 mg via INTRAVENOUS
  Filled 2016-02-20 (×6): qty 1

## 2016-02-20 MED ORDER — ALUM & MAG HYDROXIDE-SIMETH 200-200-20 MG/5ML PO SUSP: 30.0000 mL | Freq: Four times a day (QID) | ORAL | Status: DC | PRN

## 2016-02-20 MED ORDER — ACETAMINOPHEN 325 MG PO TABS
650.0000 mg | ORAL_TABLET | Freq: Four times a day (QID) | ORAL | Status: DC | PRN
  Administered 2016-02-21 – 2016-03-09 (×7): 650 mg via ORAL
  Filled 2016-02-20 (×7): qty 2

## 2016-02-20 MED ORDER — ONDANSETRON HCL 4 MG PO TABS: 4.0000 mg | ORAL_TABLET | Freq: Four times a day (QID) | ORAL | Status: DC | PRN

## 2016-02-20 MED ORDER — DEXTROSE 5 % IV SOLN
1.0000 g | Freq: Once | INTRAVENOUS | Status: AC
  Administered 2016-02-20: 1 g via INTRAVENOUS
  Filled 2016-02-20: qty 10

## 2016-02-20 MED ORDER — PANTOPRAZOLE SODIUM 40 MG IV SOLR
40.0000 mg | Freq: Once | INTRAVENOUS | Status: AC
  Administered 2016-02-20: 40 mg via INTRAVENOUS
  Filled 2016-02-20: qty 40

## 2016-02-20 MED ORDER — DEXTROSE 5 % IV SOLN
1.0000 g | INTRAVENOUS | Status: DC
  Administered 2016-02-21 – 2016-02-22 (×2): 1 g via INTRAVENOUS
  Filled 2016-02-20 (×2): qty 10

## 2016-02-20 MED ORDER — HALOPERIDOL LACTATE 5 MG/ML IJ SOLN
2.0000 mg | Freq: Two times a day (BID) | INTRAMUSCULAR | Status: DC
  Administered 2016-02-20 – 2016-02-21 (×3): 2 mg via INTRAVENOUS
  Filled 2016-02-20 (×3): qty 1

## 2016-02-20 MED ORDER — HYDROMORPHONE HCL 1 MG/ML IJ SOLN
0.5000 mg | INTRAMUSCULAR | Status: DC | PRN
  Administered 2016-02-20: 1 mg via INTRAVENOUS
  Administered 2016-02-21: 0.5 mg via INTRAVENOUS
  Administered 2016-02-21: 1 mg via INTRAVENOUS
  Administered 2016-02-21: 0.5 mg via INTRAVENOUS
  Administered 2016-02-22 – 2016-02-23 (×2): 1 mg via INTRAVENOUS
  Filled 2016-02-20 (×8): qty 1

## 2016-02-20 MED ORDER — DEXTROSE-NACL 5-0.45 % IV SOLN
INTRAVENOUS | Status: DC
  Administered 2016-02-20 (×2): via INTRAVENOUS

## 2016-02-20 MED ORDER — LORAZEPAM 1 MG PO TABS
1.0000 mg | ORAL_TABLET | ORAL | Status: DC | PRN
  Administered 2016-02-21: 2 mg via ORAL
  Filled 2016-02-20 (×2): qty 2

## 2016-02-20 MED ORDER — METRONIDAZOLE IN NACL 5-0.79 MG/ML-% IV SOLN
500.0000 mg | Freq: Three times a day (TID) | INTRAVENOUS | Status: DC
  Administered 2016-02-20 – 2016-02-21 (×4): 500 mg via INTRAVENOUS
  Filled 2016-02-20 (×5): qty 100

## 2016-02-20 MED ORDER — LORAZEPAM 2 MG/ML IJ SOLN
1.0000 mg | INTRAMUSCULAR | Status: DC | PRN
  Administered 2016-02-20: 2 mg via INTRAVENOUS
  Administered 2016-02-20: 1 mg via INTRAVENOUS
  Administered 2016-02-20 – 2016-02-22 (×8): 2 mg via INTRAVENOUS
  Filled 2016-02-20 (×11): qty 1

## 2016-02-20 MED ORDER — HALOPERIDOL 2 MG PO TABS: 2.0000 mg | ORAL_TABLET | Freq: Two times a day (BID) | ORAL | Status: DC

## 2016-02-20 MED ORDER — DULOXETINE HCL 30 MG PO CPEP
30.0000 mg | ORAL_CAPSULE | Freq: Every day | ORAL | Status: DC
  Administered 2016-02-21 – 2016-03-09 (×18): 30 mg via ORAL
  Filled 2016-02-20 (×30): qty 1

## 2016-02-20 MED ORDER — GABAPENTIN 100 MG PO CAPS: 100.0000 mg | ORAL_CAPSULE | Freq: Three times a day (TID) | ORAL | Status: DC

## 2016-02-20 MED ORDER — PANTOPRAZOLE SODIUM 40 MG IV SOLR: 40.0000 mg | Freq: Two times a day (BID) | INTRAVENOUS | Status: DC

## 2016-02-20 MED ORDER — SODIUM CHLORIDE 0.9 % IV SOLN
INTRAVENOUS | Status: DC
  Administered 2016-02-20 (×2): via INTRAVENOUS

## 2016-02-20 MED ORDER — ACETAMINOPHEN 650 MG RE SUPP
650.0000 mg | Freq: Four times a day (QID) | RECTAL | Status: DC | PRN
  Administered 2016-02-20: 650 mg via RECTAL
  Filled 2016-02-20: qty 1

## 2016-02-20 MED ORDER — OXYCODONE HCL 5 MG PO TABS: 5.0000 mg | ORAL_TABLET | ORAL | Status: DC | PRN

## 2016-02-20 MED ORDER — TRAZODONE HCL 50 MG PO TABS: 50.0000 mg | ORAL_TABLET | Freq: Every evening | ORAL | Status: DC | PRN

## 2016-02-20 MED ORDER — ENOXAPARIN SODIUM 30 MG/0.3ML ~~LOC~~ SOLN
30.0000 mg | SUBCUTANEOUS | Status: DC
  Administered 2016-02-20 – 2016-02-22 (×3): 30 mg via SUBCUTANEOUS
  Filled 2016-02-20 (×4): qty 0.3

## 2016-02-20 MED ORDER — BENZTROPINE MESYLATE 0.5 MG PO TABS
0.5000 mg | ORAL_TABLET | Freq: Two times a day (BID) | ORAL | Status: DC | PRN
  Filled 2016-02-20: qty 1

## 2016-02-20 NOTE — ED Notes (Signed)
Placed yellow socks and a "Fall Risk" band on the pt.

## 2016-02-20 NOTE — H&P (Addendum)
[facility] Admission History and Physical       Nikhita Mentzel RUE:[id number] DOB: 1961/08/17 DOA: 02/19/2016  Referring physician: EDP PCP: No primary care provider on file.  Specialists:   Chief Complaint: ABD Pain  HPI: Rhonda Mitchell is a 55 y.o. female with a history of ETOH Abuse, COPD, Hep C who presents to the ED with complaints of burning Epigastric ABD Pain and SOB, and Nausea Vomiting and Diarrhea.    She reports not sleeping, eating,  or drinking well for the past 2 days.  She drinks 3-4 pints of liquor daily and has done so for many years and her last drink was last night.  Her Alcohol level was found at 298, and she presents today requesting Detox Rx.   Her O2 sats were found at 85% and she was placed on 2 liters NCO2.    She was also found to have a UTI and was placed on empiric IV Rocephin,and the Alcohol Withdrawal Protocol was initiated and she was referred for admission.      Review of Systems:  Constitutional: No Weight Loss, No Weight Gain, Night Sweats, Fevers, Chills, Dizziness, Light Headedness, Fatigue, or Generalized Weakness HEENT: No Headaches, Difficulty Swallowing,Tooth/Dental Problems,Sore Throat,  No Sneezing, Rhinitis, Ear Ache, Nasal Congestion, or Post Nasal Drip,  Cardio-vascular:  No Chest pain, Orthopnea, PND, Edema in Lower Extremities, Anasarca, Dizziness, Palpitations  Resp: No Dyspnea, No DOE, No Productive Cough, No Non-Productive Cough, No Hemoptysis, No Wheezing.    GI: +Heartburn, Indigestion, +Abdominal Pain, Nausea, +Vomiting, +Diarrhea, Constipation, Hematemesis, Hematochezia, Melena, Change in Bowel Habits,  Loss of Appetite  GU: No Dysuria, No Change in Color of Urine, No Urgency or Urinary Frequency, No Flank pain.  Musculoskeletal: No Joint Pain or Swelling, No Decreased Range of Motion, No Back Pain.  Neurologic: No Syncope, No Seizures, Muscle Weakness, Paresthesia, Vision Disturbance or Loss, No Diplopia, No Vertigo, No  Difficulty Walking,  Skin: No Rash or Lesions. Psych: No Change in Mood or Affect, No Depression or Anxiety, No Memory loss, No Confusion, or Hallucinations   Past Medical History  Diagnosis Date  . COPD (chronic obstructive pulmonary disease) (HCC)   . Anxiety   . Shortness of breath   . Arthritis   . GERD (gastroesophageal reflux disease)   . Full dentures   . Insomnia   . Alcohol abuse   . Hepatitis C   . Alcohol abuse   . History of MRSA infection   . History of encephalopathy      Past Surgical History  Procedure Laterality Date  . Arm surgery    . Hand surgery  1990    both rt/lt carpal tunnels  . Shoulder surgery      right and left-age [age]  . Orif ulnar / radial shaft fracture  1990    right-got infection-had total 10 surgeies 1990  . Orif ulnar fracture Left 05/23/2014    Procedure: OPEN REDUCTION INTERNAL FIXATION (ORIF) LEFT ULNAR FRACTURE;  Surgeon: Harvie Junior, MD;  Location: [facility];  Service: Orthopedics;  Laterality: Left;  . I&d extremity Right 05/07/2015    Procedure: IRRIGATION AND DEBRIDEMENT  ELBOW ;  Surgeon: Dominica Severin, MD;  Location: [facility];  Service: Orthopedics;  Laterality: Right;      Prior to Admission medications   Medication Sig Start Date End Date Taking? Authorizing Provider  albuterol (PROVENTIL HFA;VENTOLIN HFA) 108 (90 BASE) MCG/ACT inhaler Inhale 2 puffs into the lungs every 6 (six) hours as  needed for wheezing or shortness of breath. 08/28/15   Renae Fickle, MD  benztropine (COGENTIN) 0.5 MG tablet Take 1 tablet (0.5 mg total) by mouth 2 (two) times daily as needed for tremors (EPS). 11/05/15   Beau Fanny, FNP  budesonide-formoterol (SYMBICORT) 160-4.5 MCG/ACT inhaler Inhale 2 puffs into the lungs 2 (two) times daily.    Historical Provider, MD  DULoxetine (CYMBALTA) 30 MG capsule Take 1 capsule (30 mg total) by mouth daily. 11/05/15   Beau Fanny, FNP  gabapentin (NEURONTIN) 100 MG capsule Take 1 capsule  (100 mg total) by mouth 3 (three) times daily. 11/05/15   Beau Fanny, FNP  haloperidol (HALDOL) 2 MG tablet Take 1 tablet (2 mg total) by mouth 2 (two) times daily. 11/05/15   Beau Fanny, FNP  ibuprofen (ADVIL,MOTRIN) 200 MG tablet Take 400 mg by mouth every 6 (six) hours as needed for moderate pain.    Historical Provider, MD  nicotine (NICODERM CQ - DOSED IN MG/24 HOURS) 21 mg/24hr patch Place 1 patch (21 mg total) onto the skin daily. Patient not taking: Reported on 11/13/2015 11/05/15   Beau Fanny, FNP  thiamine 100 MG tablet Take 1 tablet (100 mg total) by mouth daily. 11/05/15   Beau Fanny, FNP  traZODone (DESYREL) 50 MG tablet Take 1 tablet (50 mg total) by mouth at bedtime as needed for sleep. 11/05/15   Beau Fanny, FNP  vancomycin (VANCOCIN) 50 mg/mL oral solution Take 2.5 mLs (125 mg total) by mouth 4 (four) times daily. 12/20/15   Rhetta Mura, MD     No Known Allergies    Social History:  reports that she has been smoking Cigarettes.  She has a 20 pack-year smoking history. She has never used smokeless tobacco. She reports that she drinks alcohol. She reports that she does not use illicit drugs.     Family History  Problem Relation Age of Onset  . Breast cancer Mother        Physical Exam:  GEN:  Tearful Agitated Thin  55 y.o. Caucasian female examined and in no acute distress; cooperative with exam Filed Vitals:   02/20/16 0134 02/20/16 0148 02/20/16 0300 02/20/16 0335  BP: 140/102 140/102 177/93 177/93  Pulse: 101 103 95 95  Temp:      TempSrc:      Resp: 26  17   SpO2: 96%  99%    Blood pressure 177/93, pulse 95, temperature 97.4 F (36.3 C), temperature source Axillary, resp. rate 17, SpO2 99 %. PSYCH: She is alert and oriented x4; does not appear anxious does not appear depressed; affect is normal HEENT: Normocephalic and Atraumatic, Mucous membranes pink; PERRLA; EOM intact; Fundi:  Benign;  No scleral icterus, Nares: Patent,  Oropharynx: Clear, Edentulous,    Neck:  FROM, No Cervical Lymphadenopathy nor Thyromegaly or Carotid Bruit; No JVD; Breasts:: Not examined CHEST WALL: No tenderness CHEST: Normal respiration, clear to auscultation bilaterally HEART: Regular rate and rhythm; no murmurs rubs or gallops BACK: No kyphosis or scoliosis; No CVA tenderness ABDOMEN: Positive Bowel Sounds, Soft Non-Tender, No Rebound or Guarding; No Masses, No Organomegaly Rectal Exam: Not done EXTREMITIES: No Cyanosis, Clubbing, or Edema; No Ulcerations. Genitalia: not examined PULSES: 2+ and symmetric SKIN: Normal hydration no rash or ulceration CNS:  Alert and Oriented x 4, No Focal Deficits Vascular: pulses palpable throughout    Labs on Admission:  Basic Metabolic Panel:  Recent Labs Lab 02/19/16 2106  NA 148*  K  4.7  CL 108  CO2 25  GLUCOSE 132*  BUN 6  CREATININE 0.82  CALCIUM 8.0*   Liver Function Tests:  Recent Labs Lab 02/19/16 2106  AST 183*  ALT 46  ALKPHOS 70  BILITOT 0.4  PROT 6.7  ALBUMIN 3.2*    Recent Labs Lab 02/19/16 2106  LIPASE 85*   No results for input(s): AMMONIA in the last 168 hours. CBC:  Recent Labs Lab 02/19/16 2106  WBC 6.5  HGB 9.3*  HCT 29.9*  MCV 94.0  PLT 155   Cardiac Enzymes:  Recent Labs Lab 02/19/16 2313  TROPONINI <0.03    BNP (last 3 results)  Recent Labs  03/18/15 2109 08/03/15 1820  BNP 234.0* 55.1    ProBNP (last 3 results) No results for input(s): PROBNP in the last 8760 hours.  CBG: No results for input(s): GLUCAP in the last 168 hours.  Radiological Exams on Admission: Dg Abd Acute W/chest  02/19/2016  CLINICAL DATA:  55 year old female with abdominal pain EXAM: DG ABDOMEN ACUTE W/ 1V CHEST COMPARISON:  The CT dated 12/18/2015 FINDINGS: The lungs are clear. No pleural effusion or pneumothorax. The cardiac silhouette is within normal limits. There is no evidence of bowel obstruction or free air. No radiopaque calculi identified.  IMPRESSION: No radiopaque foreign object. There is degenerative changes of the osseous structures. No acute fracture. Electronically Signed   By: Elgie Collard M.D.   On: 02/19/2016 23:42     EKG: Independently reviewed. Sinus Tachycardia rate =103 + pacemaker     Assessment/Plan:    55 y.o. female with  Principal Problem:    Alcohol withdrawal (HCC)    CIWA Protocol with IV Ativan       Active Problems:    Alcohol abuse    CIWA Protocol with IV Ativan    Social Work Librarian, academic for [employer]      Dehydration    IVFs    Monitor BUN/Cr and Electrolytes      Abdominal pain, vomiting, and diarrhea- Most Likely due to Withdrawal    PRN IV Zofran    IV Protonix    Pain Control with IV Dilaudid       Hypernatremia- due to Dehydration    IVFs with D5 1/2 NSS    Monitor Na+ Levels     UTI    Urine C+S sent    IV Rocephin      COPD (chronic obstructive pulmonary disease) (HCC)    DuoNebs PRN      Chronic hepatitis C without hepatic coma (HCC)    Chronic    Monitor LFTs      Anemia- Mostly due to Chronic Disease +/- B12/Folic Acid Deficiency '   Send Anemia Panel    Send FOBT qday    DVT Prophylaxis    Lovenox     Code Status:     FULL CODE      Family Communication:   No Family Present    Disposition Plan:    Inpatient Status        Time spent: 97 Minutes      Ron Parker [facility] Pager [phone]   If 7AM -7PM Please Contact the Day Rounding Team MD for [facility]  If 7PM-7AM, Please Contact Night-Floor Coverage  [email or web address] Password [email or web address] 02/20/2016, 3:45 AM     ADDENDUM:   Patient was seen and examined on 02/20/2016

## 2016-02-20 NOTE — Progress Notes (Addendum)
02/20/2016 Patient transfer from the emergency room to 2 central at 1820. Patient was aware she was at the hospital and aware of her name. She had abrasion on face(forehead, cheek, rightt side of cheek and right side of nose) scab on right knee, feet and bruise on left AC. Stage 2 on bilateral buttocks, and prink bruise on right hip. Moisture associated skin damage on buttocks. Scratches on bilateral feet and heels dry. Patient was clean with CHG bath, place on telemetry, and place on bed alarm. It was reported she fell in the ED this am. Lovie Macadamia RN.

## 2016-02-20 NOTE — ED Notes (Signed)
EDP aware of pt condition worsening, CIWA now 17. Give  Ativan

## 2016-02-20 NOTE — Progress Notes (Signed)
Patient admitted after midnight. Current and old chart reviewed. Called by RN about continued high CIWA scores. Will schedule Ativan 2 mg every 4 hours, hold for sedation, and change when necessary Ativan to 1-2 mg every 2 hours as needed. Holding in ED for stepdown bed. Patient tried to get out of bed and was found on the floor. Now with safety sitter. Has a history of C. difficile colitis in December of last year. Has been having for more stools a day as well as abdominal pain. will stop PPI. Treat with empiric vancomycin and check C. difficile results. Enteric precautions. Will continue Rocephin for now given positive UA.  Crista Curb, MD [facility] [email or web address] password [email or web address]

## 2016-02-20 NOTE — ED Notes (Signed)
Tech is safety sitting the pt.

## 2016-02-20 NOTE — ED Notes (Signed)
[facility] ordered @  [phone].

## 2016-02-20 NOTE — ED Notes (Signed)
Heard patient call out, found her on the floor. Team got her cleaned up and back to bed. Now safety sitting with her.

## 2016-02-20 NOTE — ED Notes (Signed)
[facility] called @  [phone].

## 2016-02-21 DIAGNOSIS — N390 Urinary tract infection, site not specified: Secondary | ICD-10-CM

## 2016-02-21 MED ORDER — BENZTROPINE MESYLATE 0.5 MG PO TABS
0.5000 mg | ORAL_TABLET | Freq: Two times a day (BID) | ORAL | Status: DC
  Administered 2016-02-21 – 2016-03-09 (×35): 0.5 mg via ORAL
  Filled 2016-02-21 (×51): qty 1

## 2016-02-21 MED ORDER — SODIUM CHLORIDE 0.9% FLUSH
10.0000 mL | INTRAVENOUS | Status: DC | PRN
  Administered 2016-02-22 – 2016-02-28 (×2): 10 mL
  Administered 2016-02-29: 40 mL
  Administered 2016-03-01: 20 mL
  Administered 2016-03-02 – 2016-03-04 (×4): 10 mL
  Administered 2016-03-07: 20 mL
  Filled 2016-02-21 (×9): qty 40

## 2016-02-21 MED ORDER — SODIUM CHLORIDE 0.9% FLUSH
10.0000 mL | Freq: Two times a day (BID) | INTRAVENOUS | Status: DC
  Administered 2016-02-21 – 2016-02-26 (×6): 10 mL
  Administered 2016-02-27: 30 mL
  Administered 2016-02-27: 20 mL
  Administered 2016-02-29 – 2016-03-04 (×7): 10 mL
  Administered 2016-03-04 – 2016-03-06 (×2): 20 mL
  Administered 2016-03-07: 10 mL
  Administered 2016-03-07: 20 mL

## 2016-02-21 MED ORDER — HYDRALAZINE HCL 20 MG/ML IJ SOLN
10.0000 mg | Freq: Four times a day (QID) | INTRAMUSCULAR | Status: DC | PRN
  Filled 2016-02-21: qty 1

## 2016-02-21 MED ORDER — HALOPERIDOL 1 MG PO TABS
2.0000 mg | ORAL_TABLET | Freq: Two times a day (BID) | ORAL | Status: DC
  Administered 2016-02-21 – 2016-03-04 (×24): 2 mg via ORAL
  Filled 2016-02-21 (×4): qty 1
  Filled 2016-02-21 (×2): qty 2
  Filled 2016-02-21 (×4): qty 1
  Filled 2016-02-21: qty 2
  Filled 2016-02-21 (×2): qty 1
  Filled 2016-02-21: qty 2
  Filled 2016-02-21: qty 1
  Filled 2016-02-21 (×2): qty 2
  Filled 2016-02-21: qty 1
  Filled 2016-02-21 (×2): qty 2
  Filled 2016-02-21 (×2): qty 1
  Filled 2016-02-21 (×2): qty 2
  Filled 2016-02-21: qty 1
  Filled 2016-02-21: qty 2
  Filled 2016-02-21 (×3): qty 1
  Filled 2016-02-21: qty 2

## 2016-02-21 MED ORDER — MOMETASONE FURO-FORMOTEROL FUM 200-5 MCG/ACT IN AERO
2.0000 | INHALATION_SPRAY | Freq: Two times a day (BID) | RESPIRATORY_TRACT | Status: DC
  Administered 2016-02-21 – 2016-03-08 (×27): 2 via RESPIRATORY_TRACT
  Filled 2016-02-21 (×2): qty 8.8

## 2016-02-21 MED ORDER — ALBUTEROL SULFATE (2.5 MG/3ML) 0.083% IN NEBU
2.5000 mg | INHALATION_SOLUTION | Freq: Four times a day (QID) | RESPIRATORY_TRACT | Status: DC
  Administered 2016-02-21 (×3): 2.5 mg via RESPIRATORY_TRACT
  Filled 2016-02-21 (×3): qty 3

## 2016-02-21 MED ORDER — OXYCODONE HCL 5 MG PO TABS
5.0000 mg | ORAL_TABLET | Freq: Four times a day (QID) | ORAL | Status: DC | PRN
  Administered 2016-02-21 – 2016-03-09 (×36): 5 mg via ORAL
  Filled 2016-02-21 (×37): qty 1

## 2016-02-21 NOTE — Progress Notes (Signed)
Peripherally Inserted Central Catheter/Midline Placement  The IV Nurse has discussed with the patient and/or persons authorized to consent for the patient, the purpose of this procedure and the potential benefits and risks involved with this procedure.  The benefits include less needle sticks, lab draws from the catheter and patient may be discharged home with the catheter.  Risks include, but not limited to, infection, bleeding, blood clot (thrombus formation), and puncture of an artery; nerve damage and irregular heat beat.  Alternatives to this procedure were also discussed.  PICC/Midline Placement Documentation        Rhonda Mitchell, Rhonda Mitchell 02/21/2016, 5:59 PM

## 2016-02-21 NOTE — Progress Notes (Addendum)
[facility] PROGRESS NOTE  Rhonda GillesMichelle Mitchell RUE:[id number] DOB: [birth year] DOA: 02/19/2016 PCP: No primary care provider on file.  Assessment/Plan:  Principal Problem:   Alcohol withdrawal (HCC): CIWAs down and now sleepy. Will d/c scheduled ativan. Continue PRN and CIWAs. Continue SDU today Active Problems:   COPD (chronic obstructive pulmonary disease) (HCC) stable   Dehydration improving   Chronic hepatitis C without hepatic coma (HCC)   Abdominal pain, vomiting, and diarrhea: no further diarrhea or vomiting. D/c c diff precautions and treatment. ADAT   Alcohol abuse   Hypernatremia   Anemia: no evidence of bleed. Anemia panel last fall ok Thrombocytopenia: recheck. Likely EtOH related UTI: await cx results. Continue Rocephin Lost IV access. PICC ordered COPD: wheezing currently. Resume symbicort. Order nebs  Code Status:  full Family Communication:   Disposition Plan:  SDU Consultants:    Procedures:     Antibiotics:    HPI/Subjective: Per RN, tol clears no n/v/d. cooperative  Objective: Filed Vitals:   02/21/16 0700 02/21/16 0828  BP: 154/93 152/92  Pulse: 70 74  Temp:  98.5 F (36.9 C)  Resp:  13    Intake/Output Summary (Last 24 hours) at 02/21/16 1147 Last data filed at 02/21/16 0651  Gross per 24 hour  Intake 4028.75 ml  Output   2650 ml  Net 1378.75 ml   Filed Weights   02/20/16 1956  Weight: 54 kg (119 lb 0.8 oz)    Exam:   General:  Sleepy. Arousable. Difficult to understand  Cardiovascular: RRR without MGR  Respiratory: wheezing no rhonchi or rales  Abdomen: S, NT, ND  Ext: no CCE. Less tremmor  Basic Metabolic Panel:  Recent Labs Lab 02/19/16 2106 02/20/16 0522  NA 148* 141  K 4.7 3.6  CL 108 105  CO2 25 22  GLUCOSE 132* 89  BUN 6 <5*  CREATININE 0.82 0.55  CALCIUM 8.0* 7.1*   Liver Function Tests:  Recent Labs Lab 02/19/16 2106  AST 183*  ALT 46  ALKPHOS 70  BILITOT 0.4  PROT 6.7  ALBUMIN 3.2*     Recent Labs Lab 02/19/16 2106  LIPASE 85*   No results for input(s): AMMONIA in the last 168 hours. CBC:  Recent Labs Lab 02/19/16 2106 02/20/16 0522  WBC 6.5 8.1  HGB 9.3* 8.5*  HCT 29.9* 26.4*  MCV 94.0 95.0  PLT 155 99*   Cardiac Enzymes:  Recent Labs Lab 02/19/16 2313  TROPONINI <0.03   BNP (last 3 results)  Recent Labs  03/18/15 2109 08/03/15 1820  BNP 234.0* 55.1    ProBNP (last 3 results) No results for input(s): PROBNP in the last 8760 hours.  CBG: No results for input(s): GLUCAP in the last 168 hours.  Recent Results (from the past 240 hour(s))  MRSA PCR Screening     Status: None   Collection Time: 02/20/16  6:41 PM  Result Value Ref Range Status   MRSA by PCR NEGATIVE NEGATIVE Final    Comment:        The GeneXpert MRSA Assay (FDA approved for NASAL specimens only), is one component of a comprehensive MRSA colonization surveillance program. It is not intended to diagnose MRSA infection nor to guide or monitor treatment for MRSA infections.      Studies: Dg Abd Acute W/chest  02/19/2016  CLINICAL DATA:  55 year old female with abdominal pain EXAM: DG ABDOMEN ACUTE W/ 1V CHEST COMPARISON:  The CT dated 12/18/2015 FINDINGS: The lungs are clear. No pleural effusion or pneumothorax. The  cardiac silhouette is within normal limits. There is no evidence of bowel obstruction or free air. No radiopaque calculi identified. IMPRESSION: No radiopaque foreign object. There is degenerative changes of the osseous structures. No acute fracture. Electronically Signed   By: Elgie Collard M.D.   On: 02/19/2016 23:42    Scheduled Meds: . albuterol  2.5 mg Nebulization QID  . benztropine  0.5 mg Oral BID  . cefTRIAXone (ROCEPHIN)  IV  1 g Intravenous Q24H  . DULoxetine  30 mg Oral Daily  . enoxaparin (LOVENOX) injection  30 mg Subcutaneous Q24H  . haloperidol  2 mg Oral BID  . mometasone-formoterol  2 puff Inhalation BID  . thiamine  100 mg Oral  Daily   Or  . thiamine  100 mg Intravenous Daily   Continuous Infusions: . sodium chloride 75 mL/hr at 02/20/16 1848    Time spent: 35 minutes  Rhonda Mitchell  [facility]  [email or web address], password [facility] 02/21/2016, 11:47 AM  LOS: 1 day

## 2016-02-22 LAB — BASIC METABOLIC PANEL
Anion gap: 12 (ref 5–15)
BUN: <5 mg/dL — ABNORMAL LOW (ref 6–20)
CHLORIDE: 103 mmol/L (ref 101–111)
CO2: 26 mmol/L (ref 22–32)
Calcium: 7 mg/dL — ABNORMAL LOW (ref 8.9–10.3)
Creatinine, Ser: 0.6 mg/dL (ref 0.44–1.00)
GFR calc Af Amer: >60 mL/min (ref >60)
GLUCOSE: 89 mg/dL (ref 65–99)
POTASSIUM: 3.3 mmol/L — AB (ref 3.5–5.1)
SODIUM: 141 mmol/L (ref 135–145)

## 2016-02-22 LAB — CBC
HCT: 29.4 % — ABNORMAL LOW (ref 36.0–46.0)
Hemoglobin: 8.9 g/dL — ABNORMAL LOW (ref 12.0–15.0)
MCH: 29 pg (ref 26.0–34.0)
MCHC: 30.3 g/dL (ref 30.0–36.0)
MCV: 95.8 fL (ref 78.0–100.0)
PLATELETS: 116 10*3/uL — AB (ref 150–400)
RBC: 3.07 MIL/uL — ABNORMAL LOW (ref 3.87–5.11)
RDW: 20.8 % — ABNORMAL HIGH (ref 11.5–15.5)
WBC: 4.2 10*3/uL (ref 4.0–10.5)

## 2016-02-22 LAB — URINE CULTURE: Culture: >=100000

## 2016-02-22 LAB — MAGNESIUM: MAGNESIUM: 1.3 mg/dL — AB (ref 1.7–2.4)

## 2016-02-22 MED ORDER — LORAZEPAM 2 MG/ML IJ SOLN
2.0000 mg | INTRAMUSCULAR | Status: AC | PRN
  Administered 2016-02-22: 2 mg via INTRAVENOUS
  Administered 2016-02-23 – 2016-02-24 (×5): 4 mg via INTRAVENOUS
  Administered 2016-02-25: 2 mg via INTRAVENOUS
  Filled 2016-02-22 (×2): qty 2
  Filled 2016-02-22: qty 1
  Filled 2016-02-22 (×2): qty 2
  Filled 2016-02-22: qty 1
  Filled 2016-02-22 (×2): qty 2

## 2016-02-22 MED ORDER — LORAZEPAM 2 MG/ML IJ SOLN
0.0000 mg | INTRAMUSCULAR | Status: AC
  Administered 2016-02-22 – 2016-02-23 (×3): 4 mg via INTRAVENOUS
  Administered 2016-02-23: 2 mg via INTRAVENOUS
  Administered 2016-02-23 (×2): 4 mg via INTRAVENOUS
  Administered 2016-02-23 – 2016-02-24 (×2): 2 mg via INTRAVENOUS
  Administered 2016-02-24: 4 mg via INTRAVENOUS
  Administered 2016-02-24: 2 mg via INTRAVENOUS
  Filled 2016-02-22: qty 2
  Filled 2016-02-22: qty 1
  Filled 2016-02-22 (×2): qty 2
  Filled 2016-02-22 (×3): qty 1
  Filled 2016-02-22 (×3): qty 2

## 2016-02-22 MED ORDER — NICOTINE 21 MG/24HR TD PT24
21.0000 mg | MEDICATED_PATCH | Freq: Every day | TRANSDERMAL | Status: DC
  Administered 2016-02-22 – 2016-03-09 (×18): 21 mg via TRANSDERMAL
  Filled 2016-02-22 (×18): qty 1

## 2016-02-22 MED ORDER — LORAZEPAM 1 MG PO TABS: 1.0000 mg | ORAL_TABLET | ORAL | Status: DC | PRN

## 2016-02-22 MED ORDER — LORAZEPAM 1 MG PO TABS
2.0000 mg | ORAL_TABLET | Freq: Four times a day (QID) | ORAL | Status: DC
  Administered 2016-02-22 (×2): 2 mg via ORAL
  Filled 2016-02-22: qty 2

## 2016-02-22 MED ORDER — LORAZEPAM 2 MG/ML IJ SOLN: 1.0000 mg | INTRAMUSCULAR | Status: DC | PRN

## 2016-02-22 MED ORDER — SULFAMETHOXAZOLE-TRIMETHOPRIM 800-160 MG PO TABS
1.0000 | ORAL_TABLET | Freq: Two times a day (BID) | ORAL | Status: DC
  Administered 2016-02-22 – 2016-02-24 (×4): 1 via ORAL
  Filled 2016-02-22 (×6): qty 1

## 2016-02-22 MED ORDER — HALOPERIDOL LACTATE 5 MG/ML IJ SOLN
INTRAMUSCULAR | Status: AC
  Administered 2016-02-22: 03:00:00
  Filled 2016-02-22: qty 1

## 2016-02-22 MED ORDER — POTASSIUM CHLORIDE 10 MEQ/100ML IV SOLN
10.0000 meq | INTRAVENOUS | Status: AC
Start: 2016-02-22 — End: 2016-02-22
  Administered 2016-02-22 (×3): 10 meq via INTRAVENOUS
  Filled 2016-02-22 (×3): qty 100

## 2016-02-22 MED ORDER — HALOPERIDOL LACTATE 5 MG/ML IJ SOLN
2.0000 mg | Freq: Once | INTRAMUSCULAR | Status: AC
  Administered 2016-02-22: 2 mg via INTRAVENOUS

## 2016-02-22 MED ORDER — LORAZEPAM 1 MG PO TABS: 2.0000 mg | ORAL_TABLET | ORAL | Status: AC | PRN

## 2016-02-22 MED ORDER — LORAZEPAM 2 MG/ML IJ SOLN
4.0000 mg | Freq: Once | INTRAMUSCULAR | Status: AC
  Administered 2016-02-22: 4 mg via INTRAVENOUS

## 2016-02-22 MED ORDER — LORAZEPAM 2 MG/ML IJ SOLN
0.0000 mg | INTRAMUSCULAR | Status: AC
  Administered 2016-02-24 – 2016-02-25 (×6): 2 mg via INTRAVENOUS
  Administered 2016-02-25: 0 mg via INTRAVENOUS
  Administered 2016-02-25 – 2016-02-26 (×4): 2 mg via INTRAVENOUS
  Filled 2016-02-22 (×4): qty 1
  Filled 2016-02-22: qty 2
  Filled 2016-02-22 (×7): qty 1

## 2016-02-22 MED ORDER — LORAZEPAM 2 MG/ML IJ SOLN
1.0000 mg | Freq: Once | INTRAMUSCULAR | Status: AC
  Administered 2016-02-22: 1 mg via INTRAVENOUS

## 2016-02-22 MED ORDER — ALBUTEROL SULFATE (2.5 MG/3ML) 0.083% IN NEBU
2.5000 mg | INHALATION_SOLUTION | Freq: Two times a day (BID) | RESPIRATORY_TRACT | Status: DC
  Administered 2016-02-22 – 2016-02-23 (×4): 2.5 mg via RESPIRATORY_TRACT
  Filled 2016-02-22 (×4): qty 3

## 2016-02-22 MED ORDER — ENOXAPARIN SODIUM 40 MG/0.4ML ~~LOC~~ SOLN
40.0000 mg | SUBCUTANEOUS | Status: DC
  Administered 2016-02-23 – 2016-03-08 (×11): 40 mg via SUBCUTANEOUS
  Filled 2016-02-22 (×11): qty 0.4

## 2016-02-22 MED ORDER — LORAZEPAM 2 MG/ML IJ SOLN
4.0000 mg | Freq: Once | INTRAMUSCULAR | Status: AC
  Administered 2016-02-22: 4 mg via INTRAVENOUS
  Filled 2016-02-22: qty 2

## 2016-02-22 NOTE — Progress Notes (Signed)
Came to reevaluate patient. Now in 4 point restraints, tremulous, hallucinating. Have increased ativan, scheduled, and PRNs. Will give another stat dose ativan. If, unable to control DTs with intermittent ativan, will transfer to ICU and start an ativan gtt v precedex gtt.  Discussed with Dr. Vassie LollAlva in [facility] who is aware of possible transfer  Cc time 20 minutes  Crista Curborinna Zeeva Courser, MD [facility]

## 2016-02-22 NOTE — Progress Notes (Signed)
Called by Dr. Lendell CapriceSullivan to check on patient.  Patient is in 4 point restraints and restless with tremors noted, asking for a beer.  Spoke with Eber Jonesarolyn, RN and informed her to make sure she gets her scheduled ativan and her PRN ativan for CIWA score if needed.  VS 184/88, Hr 101, RR 23 on RA. Informed RN that we will follow and assist with patient and for staff to call if help needed.

## 2016-02-22 NOTE — Progress Notes (Signed)
Continues to be squirming after Ativan 4mg  iv dose; continues to be in restraints.

## 2016-02-22 NOTE — Progress Notes (Signed)
Pt noted to be tremulous; sweating; restless. States" I need one beer". Fr. Lendell CapriceSullivan notified. See flow sheet for vs.  Trying to get OOB. NT at bedside for safety. Pt placed in 4 point restraints with bed alarm.

## 2016-02-22 NOTE — Progress Notes (Signed)
Dr. Lendell CapriceSullivan on unit for reassessment of patient. Medicated as ordered.

## 2016-02-22 NOTE — Progress Notes (Signed)
[facility] PROGRESS NOTE  Sherrilee GillesMichelle Strauser WUJ:[id number] DOB: 1961-11-28 DOA: 02/19/2016 PCP: No primary care provider on file.  Assessment/Plan:  Principal Problem:   Alcohol withdrawal (HCC): CIWAs back up. Will schedule ativan 2 mg q6h Active Problems:   COPD (chronic obstructive pulmonary disease) (HCC) cont symbicort and nebs   Dehydration improving   Chronic hepatitis C without hepatic coma (HCC)   Abdominal pain, vomiting, and diarrhea: resolved   Alcohol abuse   Hypernatremia resolved   Anemia: no evidence of bleed. Anemia panel last fall ok Thrombocytopenia: recheck. Likely EtOH related UTI: pan sensitive e coli. Change to po Lost IV access. PICC in   Code Status:  full Family Communication:   Disposition Plan:  SDU Consultants:    Procedures:     Antibiotics:    HPI/Subjective: Per RN, agitated starting last night  Objective: Filed Vitals:   02/22/16 0540 02/22/16 0700  BP:  138/86  Pulse: 83 104  Temp:    Resp:      Intake/Output Summary (Last 24 hours) at 02/22/16 0840 Last data filed at 02/22/16 0640  Gross per 24 hour  Intake    813 ml  Output    200 ml  Net    613 ml   Filed Weights   02/20/16 1956  Weight: 54 kg (119 lb 0.8 oz)    Exam:   General:  Alert, tremulous, confused and difficult to understand. Agitated in restraints  Cardiovascular: RRR without MGR  Respiratory: wheezing no rhonchi or rales  Abdomen: S, NT, ND  Ext: no CCE.   Basic Metabolic Panel:  Recent Labs Lab 02/19/16 2106 02/20/16 0522 02/22/16 0328  NA 148* 141 141  K 4.7 3.6 3.3*  CL 108 105 103  CO2 25 22 26   GLUCOSE 132* 89 89  BUN 6 <5* <5*  CREATININE 0.82 0.55 0.60  CALCIUM 8.0* 7.1* 7.0*   Liver Function Tests:  Recent Labs Lab 02/19/16 2106  AST 183*  ALT 46  ALKPHOS 70  BILITOT 0.4  PROT 6.7  ALBUMIN 3.2*    Recent Labs Lab 02/19/16 2106  LIPASE 85*   No results for input(s): AMMONIA in the last 168  hours. CBC:  Recent Labs Lab 02/19/16 2106 02/20/16 0522 02/22/16 0328  WBC 6.5 8.1 4.2  HGB 9.3* 8.5* 8.9*  HCT 29.9* 26.4* 29.4*  MCV 94.0 95.0 95.8  PLT 155 99* 116*   Cardiac Enzymes:  Recent Labs Lab 02/19/16 2313  TROPONINI <0.03   BNP (last 3 results)  Recent Labs  03/18/15 2109 08/03/15 1820  BNP 234.0* 55.1    ProBNP (last 3 results) No results for input(s): PROBNP in the last 8760 hours.  CBG: No results for input(s): GLUCAP in the last 168 hours.  Recent Results (from the past 240 hour(s))  Urine culture     Status: None (Preliminary result)   Collection Time: 02/19/16 10:29 PM  Result Value Ref Range Status   Specimen Description URINE, RANDOM  Final   Special Requests NONE  Final   Culture >=100,000 COLONIES/mL ESCHERICHIA COLI  Final   Report Status PENDING  Incomplete  MRSA PCR Screening     Status: None   Collection Time: 02/20/16  6:41 PM  Result Value Ref Range Status   MRSA by PCR NEGATIVE NEGATIVE Final    Comment:        The GeneXpert MRSA Assay (FDA approved for NASAL specimens only), is one component of a comprehensive MRSA colonization surveillance program. It  is not intended to diagnose MRSA infection nor to guide or monitor treatment for MRSA infections.      Studies: No results found.  Scheduled Meds: . albuterol  2.5 mg Nebulization BID  . benztropine  0.5 mg Oral BID  . cefTRIAXone (ROCEPHIN)  IV  1 g Intravenous Q24H  . DULoxetine  30 mg Oral Daily  . enoxaparin (LOVENOX) injection  30 mg Subcutaneous Q24H  . haloperidol  2 mg Oral BID  . LORazepam  2 mg Oral 4 times per day  . mometasone-formoterol  2 puff Inhalation BID  . nicotine  21 mg Transdermal Daily  . sodium chloride flush  10-40 mL Intracatheter Q12H  . thiamine  100 mg Oral Daily   Continuous Infusions: . sodium chloride Stopped (02/21/16 1930)    Time spent: 35 minutes  Brynden Thune L  [facility]  [email or web address], password  [facility] 02/22/2016, 8:40 AM  LOS: 2 days

## 2016-02-23 LAB — BASIC METABOLIC PANEL
ANION GAP: 15 (ref 5–15)
BUN: 7 mg/dL (ref 6–20)
CALCIUM: 8.3 mg/dL — AB (ref 8.9–10.3)
CO2: 21 mmol/L — ABNORMAL LOW (ref 22–32)
Chloride: 105 mmol/L (ref 101–111)
Creatinine, Ser: 0.69 mg/dL (ref 0.44–1.00)
GLUCOSE: 84 mg/dL (ref 65–99)
POTASSIUM: 4.2 mmol/L (ref 3.5–5.1)
SODIUM: 141 mmol/L (ref 135–145)

## 2016-02-23 MED ORDER — PHENOBARBITAL SODIUM 130 MG/ML IJ SOLN
130.0000 mg | Freq: Once | INTRAMUSCULAR | Status: AC
  Administered 2016-02-23: 130 mg via INTRAVENOUS
  Filled 2016-02-23: qty 1

## 2016-02-23 MED ORDER — CLONIDINE HCL 0.1 MG PO TABS
0.1000 mg | ORAL_TABLET | Freq: Two times a day (BID) | ORAL | Status: DC
  Administered 2016-02-23 – 2016-03-05 (×21): 0.1 mg via ORAL
  Filled 2016-02-23 (×22): qty 1

## 2016-02-23 MED ORDER — LORAZEPAM 2 MG/ML IJ SOLN
4.0000 mg | Freq: Once | INTRAMUSCULAR | Status: AC
  Administered 2016-02-23: 4 mg via INTRAVENOUS
  Filled 2016-02-23: qty 2

## 2016-02-23 NOTE — Progress Notes (Addendum)
Follow up done on Patient several times through out the night. RN using frequent PRNs and scheduled Ativan. CIWA elevated.  Suggested using pain medication as needed as well as haldol. Per floor Rn Pt has had some periods of rest along with agitation. Remains in 4 point restraints with safety sitter. RN advised to update Primary Provider of Pt status. RN advised to notify myself and Provider for worsening changes.

## 2016-02-23 NOTE — Progress Notes (Addendum)
[facility] PROGRESS NOTE  Rhonda GillesMichelle Mitchell ZOX:[id number] DOB: 11-07-1961 DOA: 02/19/2016 PCP: No primary care provider on file.  Overnight: I asked rapid response nurse to help 2C staff and monitor patient for any need to transfer to ICU. Has had high CIWAs, and remains restrained, but able to keep in SDU. Received 16 mg ativan since midnight   Assessment/Plan:  Principal Problem:   Severe Alcohol withdrawal (HCC): CIWAs high, now tremulous, but a bit more appropriate and less agitated. Continue SDU and LIBERAL ativan use. Will give additional dose now. BPs up. Add clonidine. Add phenobarbitol for refractory DT Active Problems:   COPD (chronic obstructive pulmonary disease) (HCC) cont symbicort and nebs   Dehydration improving   Chronic hepatitis C without hepatic coma (HCC)   Abdominal pain, vomiting, and diarrhea: resolved   Alcohol abuse Hypokalemia corrected   Hypernatremia resolved   Anemia: no evidence of bleed. Anemia panel last fall ok Thrombocytopenia: recheck. Likely EtOH related UTI: pan sensitive e coli. Change to po Lost IV access. PICC in   Code Status:  full Family Communication:   Disposition Plan:  SDU Consultants:    Procedures:     Antibiotics:  Rocephin  bactrim  HPI/Subjective: "can i go home?'  Objective: Filed Vitals:   02/23/16 0118 02/23/16 0400  BP: 139/77 138/84  Pulse: 85 81  Temp:    Resp:  19    Intake/Output Summary (Last 24 hours) at 02/23/16 0819 Last data filed at 02/23/16 0525  Gross per 24 hour  Intake  94.67 ml  Output    300 ml  Net -205.33 ml   Filed Weights   02/20/16 1956  Weight: 54 kg (119 lb 0.8 oz)    Exam:   General:  Alert, tremulous, more appropriate and easier to understand, less agitated, no evidence for hallucinations  Cardiovascular: RRR without MGR  Respiratory: wheezing no rhonchi or rales  Abdomen: S, NT, ND  Ext: no CCE.   Basic Metabolic Panel:  Recent Labs Lab  02/19/16 2106 02/20/16 0522 02/22/16 0328 02/22/16 1050 02/23/16 0545  NA 148* 141 141  --  141  K 4.7 3.6 3.3*  --  4.2  CL 108 105 103  --  105  CO2 25 22 26   --  21*  GLUCOSE 132* 89 89  --  84  BUN 6 <5* <5*  --  7  CREATININE 0.82 0.55 0.60  --  0.69  CALCIUM 8.0* 7.1* 7.0*  --  8.3*  MG  --   --   --  1.3*  --    Liver Function Tests:  Recent Labs Lab 02/19/16 2106  AST 183*  ALT 46  ALKPHOS 70  BILITOT 0.4  PROT 6.7  ALBUMIN 3.2*    Recent Labs Lab 02/19/16 2106  LIPASE 85*   No results for input(s): AMMONIA in the last 168 hours. CBC:  Recent Labs Lab 02/19/16 2106 02/20/16 0522 02/22/16 0328  WBC 6.5 8.1 4.2  HGB 9.3* 8.5* 8.9*  HCT 29.9* 26.4* 29.4*  MCV 94.0 95.0 95.8  PLT 155 99* 116*   Cardiac Enzymes:  Recent Labs Lab 02/19/16 2313  TROPONINI <0.03   BNP (last 3 results)  Recent Labs  03/18/15 2109 08/03/15 1820  BNP 234.0* 55.1    ProBNP (last 3 results) No results for input(s): PROBNP in the last 8760 hours.  CBG: No results for input(s): GLUCAP in the last 168 hours.  Recent Results (from the past 240 hour(s))  Urine  culture     Status: None   Collection Time: 02/19/16 10:29 PM  Result Value Ref Range Status   Specimen Description URINE, RANDOM  Final   Special Requests NONE  Final   Culture >=100,000 COLONIES/mL ESCHERICHIA COLI  Final   Report Status 02/22/2016 FINAL  Final   Organism ID, Bacteria ESCHERICHIA COLI  Final      Susceptibility   Escherichia coli - MIC*    AMPICILLIN 8 SENSITIVE Sensitive     CEFAZOLIN <=4 SENSITIVE Sensitive     CEFTRIAXONE <=1 SENSITIVE Sensitive     CIPROFLOXACIN <=0.25 SENSITIVE Sensitive     GENTAMICIN <=1 SENSITIVE Sensitive     IMIPENEM <=0.25 SENSITIVE Sensitive     NITROFURANTOIN <=16 SENSITIVE Sensitive     TRIMETH/SULFA <=20 SENSITIVE Sensitive     AMPICILLIN/SULBACTAM 4 SENSITIVE Sensitive     PIP/TAZO <=4 SENSITIVE Sensitive     * >=100,000 COLONIES/mL ESCHERICHIA  COLI  MRSA PCR Screening     Status: None   Collection Time: 02/20/16  6:41 PM  Result Value Ref Range Status   MRSA by PCR NEGATIVE NEGATIVE Final    Comment:        The GeneXpert MRSA Assay (FDA approved for NASAL specimens only), is one component of a comprehensive MRSA colonization surveillance program. It is not intended to diagnose MRSA infection nor to guide or monitor treatment for MRSA infections.      Studies: No results found.  Scheduled Meds: . albuterol  2.5 mg Nebulization BID  . benztropine  0.5 mg Oral BID  . DULoxetine  30 mg Oral Daily  . enoxaparin (LOVENOX) injection  40 mg Subcutaneous Q24H  . haloperidol  2 mg Oral BID  . LORazepam  0-4 mg Intravenous Q4H   Followed by  . [START ON 02/24/2016] LORazepam  0-4 mg Intravenous Q4H  . mometasone-formoterol  2 puff Inhalation BID  . nicotine  21 mg Transdermal Daily  . sodium chloride flush  10-40 mL Intracatheter Q12H  . sulfamethoxazole-trimethoprim  1 tablet Oral Q12H  . thiamine  100 mg Oral Daily   Continuous Infusions: . sodium chloride Stopped (02/21/16 1930)    Time spent: 35 minutes  Beulah Matusek L  [facility]  [email or web address], password [facility] 02/23/2016, 8:19 AM  LOS: 3 days

## 2016-02-24 LAB — GLUCOSE, CAPILLARY: GLUCOSE-CAPILLARY: 68 mg/dL (ref 65–99)

## 2016-02-24 MED ORDER — GABAPENTIN 100 MG PO CAPS
100.0000 mg | ORAL_CAPSULE | Freq: Three times a day (TID) | ORAL | Status: DC
  Administered 2016-02-24 – 2016-03-05 (×31): 100 mg via ORAL
  Filled 2016-02-24 (×31): qty 1

## 2016-02-24 MED ORDER — ALBUTEROL SULFATE (2.5 MG/3ML) 0.083% IN NEBU
2.5000 mg | INHALATION_SOLUTION | RESPIRATORY_TRACT | Status: DC | PRN
  Administered 2016-02-24: 2.5 mg via RESPIRATORY_TRACT
  Filled 2016-02-24 (×2): qty 3

## 2016-02-24 MED ORDER — TRAZODONE HCL 50 MG PO TABS
50.0000 mg | ORAL_TABLET | Freq: Every evening | ORAL | Status: DC | PRN
  Administered 2016-02-27 – 2016-03-08 (×9): 50 mg via ORAL
  Filled 2016-02-24 (×9): qty 1

## 2016-02-24 NOTE — Progress Notes (Signed)
[facility] PROGRESS NOTE  Lylia Karn AVW:[id number] DOB: 05-13-1961 DOA: 02/19/2016 PCP: No primary care provider on file.   Assessment/Plan:  Principal Problem:   Severe Alcohol withdrawal (HCC): More appropriate and less agitated and tremulous today. Improving. Will try out of restraints. Continue liberal Ativan per protocol. Will get physical therapy evaluation. Patient interested in quitting drinking. Will consult social work. Active Problems:   COPD (chronic obstructive pulmonary disease) (HCC) cont symbicort and nebs   Dehydration resolved. Per nursing staff, eating   Chronic hepatitis C without hepatic coma (HCC)   Abdominal pain, vomiting, and diarrhea: resolved   Alcohol abuse Hypokalemia corrected   Hypernatremia resolved   Anemia: no evidence of bleed. Anemia panel last fall ok Thrombocytopenia: recheck. Likely EtOH related UTI: pan sensitive e coli. Change to po treat for 5 days total Lost IV access. PICC in   Code Status:  full Family Communication:   Disposition Plan:  SDU pt eval for dispo. Not stable for discharge   Consultants:    Procedures:     Antibiotics:  Rocephin  bactrim  HPI/Subjective: Feels better. Has been to [facility] and another alcohol treatment facility in the past. Willing to speak with social work about treatment options.  Objective: Filed Vitals:   02/24/16 0748 02/24/16 0917  BP: 142/75 110/82  Pulse: 61 63  Temp: 97.8 F (36.6 C)   Resp: 15     Intake/Output Summary (Last 24 hours) at 02/24/16 0938 Last data filed at 02/24/16 8295  Gross per 24 hour  Intake 1762.5 ml  Output      0 ml  Net 1762.5 ml   Filed Weights   02/20/16 1956  Weight: 54 kg (119 lb 0.8 oz)    Exam:   General:  Alert, much calmer and appropriate. Answers questions follows commands. Oriented to place time and situation. Does not remember me.   Cardiovascular: RRR without MGR  Respiratory: wheezing no rhonchi or  rales  Abdomen: S, NT, ND  Ext: no CCE.   Basic Metabolic Panel:  Recent Labs Lab 02/19/16 2106 02/20/16 0522 02/22/16 0328 02/22/16 1050 02/23/16 0545  NA 148* 141 141  --  141  K 4.7 3.6 3.3*  --  4.2  CL 108 105 103  --  105  CO2 --  21*  GLUCOSE 132* 89 89  --  84  BUN 6 <5* <5*  --  7  CREATININE 0.82 0.55 0.60  --  0.69  CALCIUM 8.0* 7.1* 7.0*  --  8.3*  MG  --   --   --  1.3*  --    Liver Function Tests:  Recent Labs Lab 02/19/16 2106  AST 183*  ALT 46  ALKPHOS 70  BILITOT 0.4  PROT 6.7  ALBUMIN 3.2*    Recent Labs Lab 02/19/16 2106  LIPASE 85*   No results for input(s): AMMONIA in the last 168 hours. CBC:  Recent Labs Lab 02/19/16 2106 02/20/16 0522 02/22/16 0328  WBC 6.5 8.1 4.2  HGB 9.3* 8.5* 8.9*  HCT 29.9* 26.4* 29.4*  MCV 94.0 95.0 95.8  PLT 155 99* 116*   Cardiac Enzymes:  Recent Labs Lab 02/19/16 2313  TROPONINI <0.03   BNP (last 3 results)  Recent Labs  03/18/15 2109 08/03/15 1820  BNP 234.0* 55.1    ProBNP (last 3 results) No results for input(s): PROBNP in the last 8760 hours.  CBG:  Recent Labs Lab 02/24/16 0333  GLUCAP 68  Recent Results (from the past 240 hour(s))  Urine culture     Status: None   Collection Time: 02/19/16 10:29 PM  Result Value Ref Range Status   Specimen Description URINE, RANDOM  Final   Special Requests NONE  Final   Culture >=100,000 COLONIES/mL ESCHERICHIA COLI  Final   Report Status 02/22/2016 FINAL  Final   Organism ID, Bacteria ESCHERICHIA COLI  Final      Susceptibility   Escherichia coli - MIC*    AMPICILLIN 8 SENSITIVE Sensitive     CEFAZOLIN <=4 SENSITIVE Sensitive     CEFTRIAXONE <=1 SENSITIVE Sensitive     CIPROFLOXACIN <=0.25 SENSITIVE Sensitive     GENTAMICIN <=1 SENSITIVE Sensitive     IMIPENEM <=0.25 SENSITIVE Sensitive     NITROFURANTOIN <=16 SENSITIVE Sensitive     TRIMETH/SULFA <=20 SENSITIVE Sensitive     AMPICILLIN/SULBACTAM 4 SENSITIVE  Sensitive     PIP/TAZO <=4 SENSITIVE Sensitive     * >=100,000 COLONIES/mL ESCHERICHIA COLI  MRSA PCR Screening     Status: None   Collection Time: 02/20/16  6:41 PM  Result Value Ref Range Status   MRSA by PCR NEGATIVE NEGATIVE Final    Comment:        The GeneXpert MRSA Assay (FDA approved for NASAL specimens only), is one component of a comprehensive MRSA colonization surveillance program. It is not intended to diagnose MRSA infection nor to guide or monitor treatment for MRSA infections.      Studies: No results found.  Scheduled Meds: . benztropine  0.5 mg Oral BID  . cloNIDine  0.1 mg Oral BID  . DULoxetine  30 mg Oral Daily  . enoxaparin (LOVENOX) injection  40 mg Subcutaneous Q24H  . haloperidol  2 mg Oral BID  . LORazepam  0-4 mg Intravenous Q4H   Followed by  . LORazepam  0-4 mg Intravenous Q4H  . mometasone-formoterol  2 puff Inhalation BID  . nicotine  21 mg Transdermal Daily  . sodium chloride flush  10-40 mL Intracatheter Q12H  . sulfamethoxazole-trimethoprim  1 tablet Oral Q12H  . thiamine  100 mg Oral Daily   Continuous Infusions: . sodium chloride 75 mL (02/23/16 0922)    Time spent: 25 minutes  Arieon Scalzo L  [facility]  [email or web address], password [email or web address] 02/24/2016, 9:38 AM  LOS: 4 days

## 2016-02-25 LAB — TSH: TSH: 2.875 u[IU]/mL (ref 0.350–4.500)

## 2016-02-25 LAB — CBC
HCT: 27.1 % — ABNORMAL LOW (ref 36.0–46.0)
HEMOGLOBIN: 8.3 g/dL — AB (ref 12.0–15.0)
MCH: 29.5 pg (ref 26.0–34.0)
MCHC: 30.6 g/dL (ref 30.0–36.0)
MCV: 96.4 fL (ref 78.0–100.0)
Platelets: 144 10*3/uL — ABNORMAL LOW (ref 150–400)
RBC: 2.81 MIL/uL — AB (ref 3.87–5.11)
RDW: 21.3 % — ABNORMAL HIGH (ref 11.5–15.5)
WBC: 3.7 10*3/uL — ABNORMAL LOW (ref 4.0–10.5)

## 2016-02-25 LAB — BASIC METABOLIC PANEL
ANION GAP: 8 (ref 5–15)
BUN: <5 mg/dL — ABNORMAL LOW (ref 6–20)
CHLORIDE: 113 mmol/L — AB (ref 101–111)
CO2: 21 mmol/L — AB (ref 22–32)
CREATININE: 0.44 mg/dL (ref 0.44–1.00)
Calcium: 7.6 mg/dL — ABNORMAL LOW (ref 8.9–10.3)
GFR calc non Af Amer: >60 mL/min (ref >60)
GLUCOSE: 113 mg/dL — AB (ref 65–99)
Potassium: 3.3 mmol/L — ABNORMAL LOW (ref 3.5–5.1)
Sodium: 142 mmol/L (ref 135–145)

## 2016-02-25 LAB — VITAMIN B12: Vitamin B-12: 570 pg/mL (ref 180–914)

## 2016-02-25 MED ORDER — MAGNESIUM SULFATE 2 GM/50ML IV SOLN
2.0000 g | Freq: Once | INTRAVENOUS | Status: AC
Start: 2016-02-25 — End: 2016-02-25
  Administered 2016-02-25: 2 g via INTRAVENOUS
  Filled 2016-02-25: qty 50

## 2016-02-25 MED ORDER — POTASSIUM CHLORIDE CRYS ER 20 MEQ PO TBCR
40.0000 meq | EXTENDED_RELEASE_TABLET | Freq: Once | ORAL | Status: AC
  Administered 2016-02-25: 40 meq via ORAL
  Filled 2016-02-25: qty 2

## 2016-02-25 NOTE — Progress Notes (Signed)
Utilization Review Completed.  

## 2016-02-25 NOTE — Evaluation (Signed)
Clinical/Bedside Swallow Evaluation Patient Details  Name: Rhonda Mitchell MRN: [id number] Date of Birth: 08-02-1961  Today's Date: 02/25/2016 Time: SLP Start Time (ACUTE ONLY): 1037 SLP Stop Time (ACUTE ONLY): 1056 SLP Time Calculation (min) (ACUTE ONLY): 19 min  Past Medical History:  Past Medical History  Diagnosis Date  . COPD (chronic obstructive pulmonary disease) (HCC)   . Anxiety   . Shortness of breath   . Arthritis   . GERD (gastroesophageal reflux disease)   . Full dentures   . Insomnia   . Alcohol abuse   . Hepatitis C   . Alcohol abuse   . History of MRSA infection   . History of encephalopathy    Past Surgical History:  Past Surgical History  Procedure Laterality Date  . Arm surgery    . Hand surgery  1990    both rt/lt carpal tunnels  . Shoulder surgery      right and left-age [age]  . Orif ulnar / radial shaft fracture  1990    right-got infection-had total 10 surgeies 1990  . Orif ulnar fracture Left 05/23/2014    Procedure: OPEN REDUCTION INTERNAL FIXATION (ORIF) LEFT ULNAR FRACTURE;  Surgeon: Harvie Junior, MD;  Location: [facility];  Service: Orthopedics;  Laterality: Left;  . I&d extremity Right 05/07/2015    Procedure: IRRIGATION AND DEBRIDEMENT  ELBOW ;  Surgeon: Dominica Severin, MD;  Location: [facility] OR;  Service: Orthopedics;  Laterality: Right;   HPI:  55 y.o. female with h/o COPD, anxiety, SOB, arthritis, GERD, alcohol abuse, hepatisis C, encephalopathy, MRSA infection and tobacco abuse, who presented to ED with c/o burning epigastric ABD pain, SOB, nausea, emesis and diarrhea. Pt drinks 3-4 pints of liquor daily. BSE 03/23/2015 recommended regular diet and thin liquids.   Assessment / Plan / Recommendation Clinical Impression  No evidence of oropharyngeal dysphagia and no s/s of penetration/aspiration suspect primary esophageal dysphagia with frequent belching observed throughout. Pt reported globus sensation during intake of solids and thin  liquids via straw. Mastication impaired due to edentulous status. Pt educated re: esophageal precautions (upright during meals, stay upright at least 30 minutes after meals, follow solids with liquids) and diet recommendation of Dysphagia 2 textures (due to missing dentures), thin liquids (no straws) and meds whole in puree. SLP will f/u to provide patient/family education and determine diet tolerance.    Aspiration Risk  Mild aspiration risk    Diet Recommendation Dysphagia 2 (Fine chop);Thin liquid   Liquid Administration via: Cup;No straw Medication Administration: Whole meds with puree Supervision: Patient able to self feed;Intermittent supervision to cue for compensatory strategies Compensations: Minimize environmental distractions;Slow rate;Small sips/bites;Follow solids with liquid Postural Changes: Seated upright at 90 degrees;Remain upright for at least 30 minutes after po intake    Other  Recommendations Oral Care Recommendations: Oral care BID   Follow up Recommendations   (TBD)    Frequency and Duration min 1 x/week  1 week       Prognosis Prognosis for Safe Diet Advancement: Good Barriers to Reach Goals: Behavior      Swallow Study   General HPI: 55 y.o. female with h/o COPD, anxiety, SOB, arthritis, GERD, alcohol abuse, hepatisis C, encephalopathy, MRSA infection and tobacco abuse, who presented to ED with c/o burning epigastric ABD pain, SOB, nausea, emesis and diarrhea. Pt drinks 3-4 pints of liquor daily. BSE 03/23/2015 recommended regular diet and thin liquids. Type of Study: Bedside Swallow Evaluation Previous Swallow Assessment: see HPI Diet Prior to this Study:  Regular;Thin liquids Temperature Spikes Noted: No Respiratory Status: Nasal cannula History of Recent Intubation: No Behavior/Cognition: Alert;Cooperative;Pleasant mood;Confused Oral Cavity Assessment: Within Functional Limits Oral Care Completed by SLP: No Oral Cavity - Dentition: Dentures, not  available;Edentulous Vision: Functional for self-feeding Self-Feeding Abilities: Able to feed self Patient Positioning: Upright in chair Baseline Vocal Quality: Normal Volitional Cough: Strong Volitional Swallow: Able to elicit    Oral/Motor/Sensory Function Overall Oral Motor/Sensory Function: Generalized oral weakness   Ice Chips Ice chips: Not tested   Thin Liquid Thin Liquid: Within functional limits Presentation: Cup;Self Fed;Straw    Nectar Thick Nectar Thick Liquid: Not tested   Honey Thick Honey Thick Liquid: Not tested   Puree Puree: Within functional limits Presentation: Self Fed;Spoon   Solid   GO   Solid: Impaired Presentation: Self Fed Oral Phase Functional Implications: Prolonged oral transit        Lynita LombardLauren Timika Muench 02/25/2016,12:23 PM   Lynita LombardLauren Jaydalee Bardwell, Student-SLP

## 2016-02-25 NOTE — Evaluation (Signed)
Physical Therapy Evaluation Patient Details Name: Rhonda Mitchell Tatro MRN: [id number] DOB: 1961/08/22 Today's Date: 02/25/2016   History of Present Illness  Rhonda Mitchell Petter is a 55 y.o. female with a history of ETOH Abuse, COPD, Hep C who presents to the ED with complaints of burning Epigastric ABD Pain and SOB, and Nausea Vomiting and Diarrhea. She reports not sleeping, eating, or drinking well for the past 2 days. She drinks 3-4 pints of liquor daily and has done so for many years and her last drink was last night. Her Alcohol level was found at 298, and she presents today requesting Detox Rx. Her O2 sats were found at 85% and she was placed on 2 liters NCO2. She was also found to have a UTI and was placed on empiric IV Rocephin,and the Alcohol Withdrawal Protocol was initiated and she was referred for admission.   Clinical Impression  Pt admitted with above. Pt with decreased strength, impaired cognition, impaired balance, delayed processing, and impaired sequencing. Pt to benefit from CIR upon d/c to achieve safe supervision level of function for safe transition to drug rehab.    Follow Up Recommendations CIR (but i believe drug rehab is primary d/c plan)    Equipment Recommendations  None recommended by PT (TBD)    Recommendations for Other Services       Precautions / Restrictions Precautions Precautions: Fall Restrictions Weight Bearing Restrictions: No      Mobility  Bed Mobility Overal bed mobility: Needs Assistance Bed Mobility: Supine to Sit     Supine to sit: Min assist;HOB elevated     General bed mobility comments: assist to bring hips to EOB  Transfers Overall transfer level: Needs assistance Equipment used: 1 person hand held assist Transfers: Sit to/from Stand Sit to Stand: Min assist;Mod assist         General transfer comment: had to stand in front of patient with posterior tactile cues at hips to promote upright posture due to pt locking  knees out in extension and leaning posteiorly  Ambulation/Gait Ambulation/Gait assistance: Mod assist;+2 safety/equipment Ambulation Distance (Feet): 20 Feet Assistive device: 1 person hand held assist Gait Pattern/deviations: Step-to pattern;Wide base of support;Decreased stride length Gait velocity: slow   General Gait Details: pt held onto PTs forearms and PT in front of patient providing posterior tactile cues. pt with waddle gait pattern with short shuffled steps in wide base of support  Stairs            Wheelchair Mobility    Modified Rankin (Stroke Patients Only)       Balance Overall balance assessment: Needs assistance Sitting-balance support: Feet supported;Bilateral upper extremity supported Sitting balance-Leahy Scale: Poor   Postural control: Posterior lean Standing balance support: During functional activity;Bilateral upper extremity supported Standing balance-Leahy Scale: Poor                               Pertinent Vitals/Pain Pain Assessment: No/denies pain    Home Living Family/patient expects to be discharged to::  (Drug Rehab)                      Prior Function Level of Independence: Independent         Comments: pt drank 3-4 pints of vodka daily and smokes 2 packs a cigarettes a day     Hand Dominance   Dominant Hand: Right    Extremity/Trunk Assessment   Upper Extremity Assessment: Generalized  weakness           Lower Extremity Assessment: Generalized weakness (shakiness from withdrawls)      Cervical / Trunk Assessment: Normal  Communication   Communication: No difficulties  Cognition Arousal/Alertness: Awake/alert Behavior During Therapy: WFL for tasks assessed/performed Overall Cognitive Status: Impaired/Different from baseline Area of Impairment: Orientation;Attention;Memory;Safety/judgement;Awareness;Following commands;Problem solving Orientation Level: Disoriented to;Time Current Attention  Level: Sustained Memory: Decreased short-term memory Following Commands: Follows multi-step commands inconsistently;Follows multi-step commands with increased time Safety/Judgement: Decreased awareness of safety;Decreased awareness of deficits (however is aware she needs to detox) Awareness: Emergent Problem Solving: Difficulty sequencing;Requires verbal cues;Requires tactile cues General Comments: pt reports "i'm going to rehab after this and not for just 30 days, 90 days because i'm not no spring buck."    General Comments      Exercises        Assessment/Plan    PT Assessment Patient needs continued PT services  PT Diagnosis Difficulty walking;Generalized weakness   PT Problem List Decreased strength;Decreased range of motion;Decreased activity tolerance;Decreased balance;Decreased mobility  PT Treatment Interventions DME instruction;Gait training;Functional mobility training;Therapeutic activities;Therapeutic exercise;Balance training;Cognitive remediation   PT Goals (Current goals can be found in the Care Plan section) Acute Rehab PT Goals Patient Stated Goal: go to rehab PT Goal Formulation: With patient Time For Goal Achievement: 03/03/16 Potential to Achieve Goals: Good    Frequency Min 3X/week   Barriers to discharge Decreased caregiver support      Co-evaluation               End of Session Equipment Utilized During Treatment: Gait belt;Oxygen (2LO2 via [place]) Activity Tolerance: Patient tolerated treatment well Patient left: in chair;with call bell/phone within reach;with chair alarm set;with nursing/sitter in room Nurse Communication: Mobility status         Time: 1610-9604 PT Time Calculation (min) (ACUTE ONLY): 20 min   Charges:   PT Evaluation $PT Eval Moderate Complexity: 1 Procedure     PT G CodesMarcene Brawn 02/25/2016, 10:16 AM   Lewis Shock, PT, DPT Pager #: [phone] Office #: [phone]

## 2016-02-25 NOTE — Progress Notes (Signed)
[facility] PROGRESS NOTE  Rhonda Mitchell ZOX:[id number] DOB: November 26, 1961 DOA: 02/19/2016 PCP: No primary care provider on file.  55 yo female admitted for n/v/d, UTI, EtOH withdrawal. Has required high doses of ativan, vomiting and diarrhea have resolved  Assessment/Plan:  Principal Problem:   Severe Alcohol withdrawal (HCC): more confused today. Continue SDU, ativan protocol. Clonidine. Active Problems:   COPD (chronic obstructive pulmonary disease) (HCC) cont symbicort and nebs   Chronic hepatitis C without hepatic coma (HCC)   Abdominal pain, vomiting, and diarrhea: resolved Hypokalemia corrected   Hypernatremia resolved   Anemia: no evidence of bleed. Anemia panel last fall ok Thrombocytopenia: EtOH related UTI: pan sensitive e coli. S/p 5 days abx Lost IV access. PICC in Weakness. Await PT eval. Will check B12, folate, TSH, B1  Code Status:  full Family Communication:   Disposition Plan:  SDU pt eval for dispo. Not stable for discharge   Consultants:    Procedures:     Antibiotics:  Rocephin  bactrim  HPI/Subjective: C/o difficulty walking. No new complaints  Objective: Filed Vitals:   02/25/16 0400 02/25/16 0450  BP: 152/78   Pulse: 63 62  Temp:  97.6 F (36.4 C)  Resp:  18    Intake/Output Summary (Last 24 hours) at 02/25/16 0830 Last data filed at 02/24/16 2150  Gross per 24 hour  Intake   1040 ml  Output      0 ml  Net   1040 ml   Filed Weights   02/20/16 1956  Weight: 54 kg (119 lb 0.8 oz)    Exam:   General:  Alert, more confused than 3/6. Cooperative. tremulous  Cardiovascular: RRR without MGR  Respiratory: diminished throughout without WRR  Abdomen: S, NT, ND  Ext: no CCE.   Basic Metabolic Panel:  Recent Labs Lab 02/19/16 2106 02/20/16 0522 02/22/16 0328 02/22/16 1050 02/23/16 0545 02/25/16 0445  NA 148* 141 141  --  141 142  K 4.7 3.6 3.3*  --  4.2 3.3*  CL 108 105 103  --  105 113*  CO2 --   21* 21*  GLUCOSE 132* 89 89  --  84 113*  BUN 6 <5* <5*  --  7 <5*  CREATININE 0.82 0.55 0.60  --  0.69 0.44  CALCIUM 8.0* 7.1* 7.0*  --  8.3* 7.6*  MG  --   --   --  1.3*  --   --    Liver Function Tests:  Recent Labs Lab 02/19/16 2106  AST 183*  ALT 46  ALKPHOS 70  BILITOT 0.4  PROT 6.7  ALBUMIN 3.2*    Recent Labs Lab 02/19/16 2106  LIPASE 85*   No results for input(s): AMMONIA in the last 168 hours. CBC:  Recent Labs Lab 02/19/16 2106 02/20/16 0522 02/22/16 0328 02/25/16 0445  WBC 6.5 8.1 4.2 3.7*  HGB 9.3* 8.5* 8.9* 8.3*  HCT 29.9* 26.4* 29.4* 27.1*  MCV 94.0 95.0 95.8 96.4  PLT 155 99* 116* 144*   Cardiac Enzymes:  Recent Labs Lab 02/19/16 2313  TROPONINI <0.03   BNP (last 3 results)  Recent Labs  03/18/15 2109 08/03/15 1820  BNP 234.0* 55.1    ProBNP (last 3 results) No results for input(s): PROBNP in the last 8760 hours.  CBG:  Recent Labs Lab 02/24/16 0333  GLUCAP 68    Recent Results (from the past 240 hour(s))  Urine culture     Status: None   Collection Time:  02/19/16 10:29 PM  Result Value Ref Range Status   Specimen Description URINE, RANDOM  Final   Special Requests NONE  Final   Culture >=100,000 COLONIES/mL ESCHERICHIA COLI  Final   Report Status 02/22/2016 FINAL  Final   Organism ID, Bacteria ESCHERICHIA COLI  Final      Susceptibility   Escherichia coli - MIC*    AMPICILLIN 8 SENSITIVE Sensitive     CEFAZOLIN <=4 SENSITIVE Sensitive     CEFTRIAXONE <=1 SENSITIVE Sensitive     CIPROFLOXACIN <=0.25 SENSITIVE Sensitive     GENTAMICIN <=1 SENSITIVE Sensitive     IMIPENEM <=0.25 SENSITIVE Sensitive     NITROFURANTOIN <=16 SENSITIVE Sensitive     TRIMETH/SULFA <=20 SENSITIVE Sensitive     AMPICILLIN/SULBACTAM 4 SENSITIVE Sensitive     PIP/TAZO <=4 SENSITIVE Sensitive     * >=100,000 COLONIES/mL ESCHERICHIA COLI  MRSA PCR Screening     Status: None   Collection Time: 02/20/16  6:41 PM  Result Value Ref Range Status    MRSA by PCR NEGATIVE NEGATIVE Final    Comment:        The GeneXpert MRSA Assay (FDA approved for NASAL specimens only), is one component of a comprehensive MRSA colonization surveillance program. It is not intended to diagnose MRSA infection nor to guide or monitor treatment for MRSA infections.      Studies: No results found.  Scheduled Meds: . benztropine  0.5 mg Oral BID  . cloNIDine  0.1 mg Oral BID  . DULoxetine  30 mg Oral Daily  . enoxaparin (LOVENOX) injection  40 mg Subcutaneous Q24H  . gabapentin  100 mg Oral TID  . haloperidol  2 mg Oral BID  . LORazepam  0-4 mg Intravenous Q4H  . magnesium sulfate 1 - 4 g bolus IVPB  2 g Intravenous Once  . mometasone-formoterol  2 puff Inhalation BID  . nicotine  21 mg Transdermal Daily  . potassium chloride  40 mEq Oral Once  . sodium chloride flush  10-40 mL Intracatheter Q12H  . thiamine  100 mg Oral Daily   Continuous Infusions:    Time spent: 25 minutes  Evita Merida L  [facility]  [email or web address], password [email or web address] 02/25/2016, 8:30 AM  LOS: 5 days

## 2016-02-26 ENCOUNTER — Inpatient Hospital Stay (HOSPITAL_COMMUNITY): Payer: Medicaid Other

## 2016-02-26 DIAGNOSIS — F32A Depression, unspecified: Secondary | ICD-10-CM | POA: Insufficient documentation

## 2016-02-26 DIAGNOSIS — F10231 Alcohol dependence with withdrawal delirium: Principal | ICD-10-CM

## 2016-02-26 DIAGNOSIS — Z720 Tobacco use: Secondary | ICD-10-CM

## 2016-02-26 DIAGNOSIS — R0682 Tachypnea, not elsewhere classified: Secondary | ICD-10-CM

## 2016-02-26 DIAGNOSIS — L8990 Pressure ulcer of unspecified site, unspecified stage: Secondary | ICD-10-CM | POA: Insufficient documentation

## 2016-02-26 DIAGNOSIS — F329 Major depressive disorder, single episode, unspecified: Secondary | ICD-10-CM | POA: Insufficient documentation

## 2016-02-26 LAB — BASIC METABOLIC PANEL
ANION GAP: 7 (ref 5–15)
BUN: <5 mg/dL — ABNORMAL LOW (ref 6–20)
CALCIUM: 8.4 mg/dL — AB (ref 8.9–10.3)
CO2: 22 mmol/L (ref 22–32)
Chloride: 111 mmol/L (ref 101–111)
Creatinine, Ser: 0.55 mg/dL (ref 0.44–1.00)
GFR calc Af Amer: >60 mL/min (ref >60)
GLUCOSE: 132 mg/dL — AB (ref 65–99)
POTASSIUM: 4 mmol/L (ref 3.5–5.1)
SODIUM: 140 mmol/L (ref 135–145)

## 2016-02-26 LAB — FOLATE RBC
FOLATE, HEMOLYSATE: 436.1 ng/mL
Folate, RBC: 1444 ng/mL (ref >498)
Hematocrit: 30.2 % — ABNORMAL LOW (ref 34.0–46.6)

## 2016-02-26 LAB — MAGNESIUM: MAGNESIUM: 1.6 mg/dL — AB (ref 1.7–2.4)

## 2016-02-26 MED ORDER — GADOBENATE DIMEGLUMINE 529 MG/ML IV SOLN
10.0000 mL | Freq: Once | INTRAVENOUS | Status: AC | PRN
  Administered 2016-02-26: 10 mL via INTRAVENOUS

## 2016-02-26 NOTE — Evaluation (Signed)
Occupational Therapy Evaluation Patient Details Name: Rhonda Mitchell MRN: [id number] DOB: 05-Oct-1961 Today's Date: 02/26/2016    History of Present Illness Rhonda Mitchell is a 55 y.o. female with a history of ETOH Abuse, COPD, Hep C who presents to the ED with complaints of burning Epigastric ABD Pain and SOB, and Nausea Vomiting and Diarrhea. She reports not sleeping, eating, or drinking well for the past 2 days. She drinks 3-4 pints of liquor daily and has done so for many years and her last drink was last night. Her Alcohol level was found at 298, and she presents today requesting Detox Rx. Her O2 sats were found at 85% and she was placed on 2 liters NCO2. She was also found to have a UTI and was placed on empiric IV Rocephin,and the Alcohol Withdrawal Protocol was initiated and she was referred for admission.    Clinical Impression   PT admitted with N/v/d sob and ETOH withdrawal. Pt currently with functional limitiations due to the deficits listed below (see OT problem list). PTA was independent per chart.  Pt will benefit from skilled OT to increase their independence and safety with adls and balance to allow discharge CIR. Pt very unsteady , impulsive and unable to static stand. Pt fixated on pain this session.      Follow Up Recommendations  CIR;Supervision/Assistance - 24 hour    Equipment Recommendations  3 in 1 bedside comode;Other (comment) (RW)    Recommendations for Other Services Rehab consult     Precautions / Restrictions Precautions Precautions: Fall Precaution Comments: watch o2 Restrictions Weight Bearing Restrictions: No      Mobility Bed Mobility               General bed mobility comments: in chair on arrival  Transfers Overall transfer level: Needs assistance Equipment used: 1 person hand held assist Transfers: Sit to/from Stand Sit to Stand: Max assist         General transfer comment: pt with posterior lean. pt walking  feet away from core and incr posterior lean adn return to sitting. pt with bil LE positioned again and sit<>stand attempted again. pt static standign for ~ 1 min with more anxious with standing    Balance Overall balance assessment: Needs assistance         Standing balance support: Single extremity supported;During functional activity Standing balance-Leahy Scale: Poor                              ADL Overall ADL's : Needs assistance/impaired     Grooming: Wash/dry face;Min guard;Sitting                   Toilet Transfer: Maximal assistance             General ADL Comments: Pt requesting to walk while in chair with bil Knee flexed. Pt able to reach feet in chair. Pt washing face. Pt becoming liable with all mobility. pt very impulsive     Vision     Perception     Praxis      Pertinent Vitals/Pain Pain Assessment: Faces Faces Pain Scale: Hurts worst Pain Location: bil LE  Pain Intervention(s): Premedicated before session;Repositioned;Patient requesting pain meds-RN notified     Hand Dominance Right   Extremity/Trunk Assessment Upper Extremity Assessment Upper Extremity Assessment: Generalized weakness   Lower Extremity Assessment Lower Extremity Assessment: Defer to PT evaluation   Cervical / Trunk Assessment Cervical /  Trunk Assessment: Normal   Communication Communication Communication: No difficulties   Cognition Arousal/Alertness: Awake/alert Behavior During Therapy: Impulsive;Anxious Overall Cognitive Status: Impaired/Different from baseline Area of Impairment: Orientation;Attention;Memory;Safety/judgement;Awareness;Following commands;Problem solving Orientation Level: Disoriented to;Time;Situation Current Attention Level: Sustained Memory: Decreased short-term memory Following Commands: Follows one step commands with increased time;Follows one step commands inconsistently Safety/Judgement: Decreased awareness of  safety;Decreased awareness of deficits Awareness: Intellectual Problem Solving: Slow processing;Decreased initiation General Comments: Pt reports "i walk to go for a walk." then later states "i will never walk again " before to any attempts at standing. Pt states "I just want to die. I am sorry but I just can't live like this" Pt very impulsive and strong posterior lean with static standing and no attempt to self correct Pt fixated on medications and asking for more medication   General Comments       Exercises       Shoulder Instructions      Home Living Family/patient expects to be discharged to:: Inpatient rehab                                 Additional Comments: unknown at this time      Prior Functioning/Environment Level of Independence: Independent        Comments: pt drank 3-4 pints of vodka daily and smokes 2 packs a cigarettes a day    OT Diagnosis: Generalized weakness;Acute pain;Other (comment);Cognitive deficits   OT Problem List: Decreased strength;Decreased activity tolerance;Impaired balance (sitting and/or standing);Decreased cognition;Decreased safety awareness;Decreased knowledge of use of DME or AE;Decreased knowledge of precautions;Cardiopulmonary status limiting activity;Pain   OT Treatment/Interventions: Self-care/ADL training;DME and/or AE instruction;Energy conservation;Therapeutic exercise;Therapeutic activities;Cognitive remediation/compensation;Patient/family education;Balance training    OT Goals(Current goals can be found in the care plan section) Acute Rehab OT Goals Patient Stated Goal: go to rehab OT Goal Formulation: Patient unable to participate in goal setting Time For Goal Achievement: 03/11/16 Potential to Achieve Goals: Good  OT Frequency: Min 2X/week   Barriers to D/C: Other (comment) (unknown (A) level)          Co-evaluation              End of Session Equipment Utilized During Treatment: Gait  belt;Oxygen Nurse Communication: Mobility status;Precautions  Activity Tolerance: Patient limited by pain;Other (comment) (anxiety) Patient left: in chair;with call bell/phone within reach;with chair alarm set   Time: 1049-1104 OT Time Calculation (min): 15 min Charges:  OT General Charges $OT Visit: 1 Procedure OT Evaluation $OT Eval High Complexity: 1 Procedure G-Codes:    Rhonda Mitchell, Rhonda Mitchell 02/26/2016, 11:16 AM   Rhonda Mitchell, Rhonda Mitchell   OTR/L Pager: [phone] Office: [phone] .

## 2016-02-26 NOTE — [place] FL2 (Signed)
[place] MEDICAID FL2 LEVEL OF CARE SCREENING TOOL     IDENTIFICATION  Patient Name: Rhonda Mitchell Birthdate: 10-Dec-1961 Sex: female Admission Date (Current Location): 02/19/2016  [facility] and [place] Number:  Producer, television/film/video and Address:  [facility], [address], [place], [place] [address]      Provider Number: [id number]  Attending Physician Name and Address:  Maretta Bees, MD  Relative Name and Phone Number:  Lavena Bullion, significant other, [phone]    Current Level of Care: Hospital Recommended Level of Care: Skilled Nursing Facility Prior Approval Number:    Date Approved/Denied:   PASRR Number:   [id number]   Discharge Plan: SNF    Current Diagnoses: Patient Active Problem List   Diagnosis Date Noted  . Pressure ulcer 02/26/2016  . Tachypnea   . Depression   . Hypernatremia 02/20/2016  . Anemia 02/20/2016  . UTI (lower urinary tract infection)   . Abdominal pain, vomiting, and diarrhea 12/18/2015  . Alcohol abuse 12/18/2015  . Abdominal pain 12/18/2015  . Elevated blood pressure 12/18/2015  . Severe recurrent major depression without psychotic features (HCC) 10/28/2015  . Alcohol dependence with uncomplicated withdrawal (HCC) 10/27/2015  . Alcohol-induced mood disorder (HCC) 10/27/2015  . Psychosis 08/21/2015  . Hypomagnesemia   . Protein-calorie malnutrition (HCC)   . Hepatitis C antibody test positive   . Elevated LFTs   . Hyperkalemia   . Severe protein-calorie malnutrition (HCC)   . Acute respiratory failure with hypoxia (HCC) 08/03/2015  . Septic olecranon bursitis of right elbow 05/08/2015  . Septic bursitis of elbow 05/07/2015  . Thrombocytopenia (HCC) 05/07/2015  . Chronic hepatitis C without hepatic coma (HCC) 05/07/2015  . Hypokalemia 05/07/2015  . Abscess of bursa of elbow 05/07/2015  . COPD exacerbation (HCC) 03/18/2015  . Hyponatremia 03/18/2015  . Hyperglycemia 03/18/2015  . Alcohol  dependence (HCC) 12/07/2014  . Alcohol withdrawal (HCC) 12/07/2014  . Protein-calorie malnutrition, severe (HCC) 12/07/2014  . Musculoskeletal chest pain 12/07/2014  . Tobacco abuse 12/07/2014  . Carbuncle and furuncle 12/07/2014  . Macrocytic anemia 12/07/2014  . Chest pain 11/30/2014  . Tachycardia 11/30/2014  . COPD (chronic obstructive pulmonary disease) (HCC) 11/30/2014  . Cellulitis and abscess 11/30/2014  . Atypical chest pain   . Dehydration     Orientation RESPIRATION BLADDER Height & Weight     Self, Situation, Place  O2 (Nasal cannula 2L) Continent Weight: 119 lb 0.8 oz (54 kg) Height:   (162.6 cm)  BEHAVIORAL SYMPTOMS/MOOD NEUROLOGICAL BOWEL NUTRITION STATUS      Continent  (Please see DC summary)  AMBULATORY STATUS COMMUNICATION OF NEEDS Skin   Extensive Assist Verbally PU Stage and Appropriate Care (Stage II on buttocks; wound on face)                       Personal Care Assistance Level of Assistance  Bathing, Feeding, Dressing Bathing Assistance: Limited assistance Feeding assistance: Independent Dressing Assistance: Limited assistance     Functional Limitations Info             SPECIAL CARE FACTORS FREQUENCY  PT (By licensed PT), OT (By licensed OT)     PT Frequency: min 3x/week OT Frequency: min 2x/week            Contractures      Additional Factors Info  Code Status, Allergies, Isolation Precautions, Psychotropic Code Status Info: Full Allergies Info: NKA Psychotropic Info: Cymbalta, Haldol   Isolation Precautions  Info: MRSA     Current Medications (02/26/2016):  This is the current hospital active medication list Current Facility-Administered Medications  Medication Dose Route Frequency Provider Last Rate Last Dose  . acetaminophen (TYLENOL) tablet 650 mg  650 mg Oral Q6H PRN Ron ParkerHarvette C Jenkins, MD   650 mg at 02/25/16 16100628   Or  . acetaminophen (TYLENOL) suppository 650 mg  650 mg Rectal Q6H PRN Ron ParkerHarvette C Jenkins, MD    650 mg at 02/20/16 2354  . albuterol (PROVENTIL) (2.5 MG/3ML) 0.083% nebulizer solution 2.5 mg  2.5 mg Nebulization Q4H PRN Christiane Haorinna L Sullivan, MD   2.5 mg at 02/24/16 0923  . alum & mag hydroxide-simeth (MAALOX/MYLANTA) 200-200-20 MG/5ML suspension 30 mL  30 mL Oral Q6H PRN Ron ParkerHarvette C Jenkins, MD      . benztropine (COGENTIN) tablet 0.5 mg  0.5 mg Oral BID Christiane Haorinna L Sullivan, MD   0.5 mg at 02/26/16 1000  . cloNIDine (CATAPRES) tablet 0.1 mg  0.1 mg Oral BID Christiane Haorinna L Sullivan, MD   0.1 mg at 02/26/16 1010  . DULoxetine (CYMBALTA) DR capsule 30 mg  30 mg Oral Daily Ron ParkerHarvette C Jenkins, MD   30 mg at 02/26/16 1010  . enoxaparin (LOVENOX) injection 40 mg  40 mg Subcutaneous Q24H Lynita LombardJennifer D Knoll[place], RPH   40 mg at 02/25/16 1618  . gabapentin (NEURONTIN) capsule 100 mg  100 mg Oral TID Christiane Haorinna L Sullivan, MD   100 mg at 02/26/16 1010  . haloperidol (HALDOL) tablet 2 mg  2 mg Oral BID Christiane Haorinna L Sullivan, MD   2 mg at 02/26/16 1010  . mometasone-formoterol (DULERA) 200-5 MCG/ACT inhaler 2 puff  2 puff Inhalation BID Christiane Haorinna L Sullivan, MD   2 puff at 02/26/16 0746  . nicotine (NICODERM CQ - dosed in mg/24 hours) patch 21 mg  21 mg Transdermal Daily Rolan Lipahomas Michael Callahan, NP   21 mg at 02/26/16 1010  . ondansetron (ZOFRAN) tablet 4 mg  4 mg Oral Q6H PRN Ron ParkerHarvette C Jenkins, MD       Or  . ondansetron (ZOFRAN) injection 4 mg  4 mg Intravenous Q6H PRN Ron ParkerHarvette C Jenkins, MD   4 mg at 02/21/16 2300  . oxyCODONE (Oxy IR/ROXICODONE) immediate release tablet 5 mg  5 mg Oral Q6H PRN Christiane Haorinna L Sullivan, MD   5 mg at 02/25/16 1228  . sodium chloride flush (NS) 0.9 % injection 10-40 mL  10-40 mL Intracatheter Q12H Christiane Haorinna L Sullivan, MD   10 mL at 02/26/16 1000  . sodium chloride flush (NS) 0.9 % injection 10-40 mL  10-40 mL Intracatheter PRN Christiane Haorinna L Sullivan, MD   10 mL at 02/22/16 2201  . thiamine (VITAMIN B-1) tablet 100 mg  100 mg Oral Daily Shon Batonourtney F Horton, MD   100 mg at 02/26/16 1010  . traZODone  (DESYREL) tablet 50 mg  50 mg Oral QHS PRN Christiane Haorinna L Sullivan, MD         Discharge Medications: Please see discharge summary for a list of discharge medications.  Relevant Imaging Results:  Relevant Lab Results:   Additional Information SSN: [id number]  Kharis Lap[address] [place], [place]

## 2016-02-26 NOTE — Consult Note (Signed)
[place] Psychiatry Consult   Reason for Consult:  Alcohol intoxication and withdrawal and depression. Referring Physician:  Dr. Sloan Leiter Patient Identification: Rhonda Mitchell MRN:  [id number] Principal Diagnosis: Alcohol withdrawal [facility]) Diagnosis:   Patient Active Problem List   Diagnosis Date Noted  . Pressure ulcer [L89.90] 02/26/2016  . Hypernatremia [E87.0] 02/20/2016  . Anemia [D64.9] 02/20/2016  . UTI (lower urinary tract infection) [N39.0]   . Abdominal pain, vomiting, and diarrhea [R10.9, R11.10, R19.7] 12/18/2015  . Alcohol abuse [F10.10] 12/18/2015  . Abdominal pain [R10.9] 12/18/2015  . Elevated blood pressure [R03.0] 12/18/2015  . Severe recurrent major depression without psychotic features ([place]) [F33.2] 10/28/2015  . Alcohol dependence with uncomplicated withdrawal ([place]) [F10.230] 10/27/2015  . Alcohol-induced mood disorder ([place]) [F10.94] 10/27/2015  . Psychosis [F29] 08/21/2015  . Hypomagnesemia [E83.42]   . Protein-calorie malnutrition ([place]) [E46]   . Hepatitis C antibody test positive [R89.4]   . Elevated LFTs [R79.89]   . Hyperkalemia [E87.5]   . Severe protein-calorie malnutrition ([place]) [E43]   . Acute respiratory failure with hypoxia ([place]) [J96.01] 08/03/2015  . Septic olecranon bursitis of right elbow [M70.21, B96.89] 05/08/2015  . Septic bursitis of elbow [M71.129] 05/07/2015  . Thrombocytopenia ([place]) [D69.6] 05/07/2015  . Chronic hepatitis C without hepatic coma ([place]) [B18.2] 05/07/2015  . Hypokalemia [E87.6] 05/07/2015  . Abscess of bursa of elbow [M71.029] 05/07/2015  . COPD exacerbation ([place]) [J44.1] 03/18/2015  . Hyponatremia [E87.1] 03/18/2015  . Hyperglycemia [R73.9] 03/18/2015  . Alcohol dependence ([place]) [F10.20] 12/07/2014  . Alcohol withdrawal ([place]) [F10.239] 12/07/2014  . Protein-calorie malnutrition, severe ([place]) [E43] 12/07/2014  . Musculoskeletal chest pain [R07.89] 12/07/2014  . Tobacco abuse [Z72.0] 12/07/2014  . Carbuncle and  furuncle [L02.92, L02.93] 12/07/2014  . Macrocytic anemia [D53.9] 12/07/2014  . Chest pain [R07.9] 11/30/2014  . Tachycardia [R00.0] 11/30/2014  . COPD (chronic obstructive pulmonary disease) ([place]) [J44.9] 11/30/2014  . Cellulitis and abscess [L03.90, L02.91] 11/30/2014  . Atypical chest pain [R07.89]   . Dehydration [E86.0]     Total Time spent with patient: 45 minutes  Subjective:   Rhonda Mitchell is a 55 y.o. female patient admitted with alcohol abuse and depression.  HPI:  Rhonda Mitchell is a 55 y.o. female seen and case discussed with the staff RN for the face-to-face psychiatry consultation and evaluation of alcohol abuse versus dependence and depression. Patient appeared sitting in a chair next to the bed, and talking with her fianc on the phone. Patient endorses drinking vodka 4 pints daily and now she won't quit because she did not feel good any more. Patient reported her fianc removed on the alcohol from home and not planning to buy more. Patient does not feel good because of her leg weakness and wanted to stay in hospital until she get much more healthy unable to care for herself. Patient also endorses depression and has been taking medication. Patient denies current symptoms of suicidal/homicidal ideation, auditory/visual hallucinations and delusions. Patient contract for safety and planning to go back to her home with her fianc who is a Administrator and currently out of town.  Patient reportedly being involved with Alcoholics Anonymous groups and willing to participate in substance abuse treatment program for the rehabilitation when medically stable.   Past Psychiatric History: Patient is known to this provider from her previous encounters and hospitalizations.  Risk to Self: Is patient at risk for suicide?: No, but patient needs Medical Clearance Risk to Others:   Prior Inpatient Therapy:   Prior Outpatient Therapy:  Past Medical History:  Past Medical History   Diagnosis Date  . COPD (chronic obstructive pulmonary disease) ([place])   . Anxiety   . Shortness of breath   . Arthritis   . GERD (gastroesophageal reflux disease)   . Full dentures   . Insomnia   . Alcohol abuse   . Hepatitis C   . Alcohol abuse   . History of MRSA infection   . History of encephalopathy     Past Surgical History  Procedure Laterality Date  . Arm surgery    . Hand surgery  1990    both rt/lt carpal tunnels  . Shoulder surgery      right and left-age [age]  . Orif ulnar / radial shaft fracture  1990    right-got infection-had total 10 surgeies 1990  . Orif ulnar fracture Left 05/23/2014    Procedure: OPEN REDUCTION INTERNAL FIXATION (ORIF) LEFT ULNAR FRACTURE;  Surgeon: Alta Corning, MD;  Location: [facility];  Service: Orthopedics;  Laterality: Left;  . I&d extremity Right 05/07/2015    Procedure: IRRIGATION AND DEBRIDEMENT  ELBOW ;  Surgeon: Roseanne Kaufman, MD;  Location: [facility];  Service: Orthopedics;  Laterality: Right;   Family History:  Family History  Problem Relation Age of Onset  . Breast cancer Mother    Family Psychiatric  History: Unknown  Social History:  History  Alcohol Use  . Yes    Comment: 1/5th Vodka every day      History  Drug Use No    Comment: Hx: herion, cocaine 5 yrs ago    Social History   Social History  . Marital Status: Legally Separated    Spouse Name: N/A  . Number of Children: N/A  . Years of Education: N/A   Social History Main Topics  . Smoking status: Current Every Day Smoker -- 1.00 packs/day for 20 years    Types: Cigarettes  . Smokeless tobacco: Never Used  . Alcohol Use: Yes     Comment: 1/5th Vodka every day   . Drug Use: No     Comment: Hx: herion, cocaine 5 yrs ago  . Sexual Activity: Yes    Birth Control/ Protection: Post-menopausal   Other Topics Concern  . None   Social History Narrative   Additional Social History:    Allergies:  No Known Allergies  Labs:  Results for  orders placed or performed during the hospital encounter of 02/19/16 (from the past 48 hour(s))  Basic metabolic panel     Status: Abnormal   Collection Time: 02/25/16  4:45 AM  Result Value Ref Range   Sodium 142 135 - 145 mmol/L   Potassium 3.3 (L) 3.5 - 5.1 mmol/L    Comment: DELTA CHECK NOTED   Chloride 113 (H) 101 - 111 mmol/L   CO2 21 (L) 22 - 32 mmol/L   Glucose, Bld 113 (H) 65 - 99 mg/dL   BUN <5 (L) 6 - 20 mg/dL   Creatinine, Ser 0.44 0.44 - 1.00 mg/dL   Calcium 7.6 (L) 8.9 - 10.3 mg/dL   GFR calc non Af Amer >60 >60 mL/min   GFR calc Af Amer >60 >60 mL/min    Comment: (NOTE) The eGFR has been calculated using the CKD EPI equation. This calculation has not been validated in all clinical situations. eGFR's persistently <60 mL/min signify possible Chronic Kidney Disease.    Anion gap 8 5 - 15  CBC     Status: Abnormal  Collection Time: 02/25/16  4:45 AM  Result Value Ref Range   WBC 3.7 (L) 4.0 - 10.5 K/uL   RBC 2.81 (L) 3.87 - 5.11 MIL/uL   Hemoglobin 8.3 (L) 12.0 - 15.0 g/dL   HCT 27.1 (L) 36.0 - 46.0 %   MCV 96.4 78.0 - 100.0 fL   MCH 29.5 26.0 - 34.0 pg   MCHC 30.6 30.0 - 36.0 g/dL   RDW 21.3 (H) 11.5 - 15.5 %   Platelets 144 (L) 150 - 400 K/uL  TSH     Status: None   Collection Time: 02/25/16 10:08 AM  Result Value Ref Range   TSH 2.875 0.350 - 4.500 uIU/mL  Vitamin B12     Status: None   Collection Time: 02/25/16 10:09 AM  Result Value Ref Range   Vitamin B-12 570 180 - 914 pg/mL    Comment: (NOTE) This assay is not validated for testing neonatal or myeloproliferative syndrome specimens for Vitamin B12 levels.     Current Facility-Administered Medications  Medication Dose Route Frequency Provider Last Rate Last Dose  . acetaminophen (TYLENOL) tablet 650 mg  650 mg Oral Q6H PRN Theressa Millard, MD   650 mg at 02/25/16 2876   Or  . acetaminophen (TYLENOL) suppository 650 mg  650 mg Rectal Q6H PRN Theressa Millard, MD   650 mg at 02/20/16 2354  .  albuterol (PROVENTIL) (2.5 MG/3ML) 0.083% nebulizer solution 2.5 mg  2.5 mg Nebulization Q4H PRN Delfina Redwood, MD   2.5 mg at 02/24/16 0923  . alum & mag hydroxide-simeth (MAALOX/MYLANTA) 200-200-20 MG/5ML suspension 30 mL  30 mL Oral Q6H PRN Theressa Millard, MD      . benztropine (COGENTIN) tablet 0.5 mg  0.5 mg Oral BID Delfina Redwood, MD   0.5 mg at 02/26/16 1000  . cloNIDine (CATAPRES) tablet 0.1 mg  0.1 mg Oral BID Delfina Redwood, MD   0.1 mg at 02/26/16 1010  . DULoxetine (CYMBALTA) DR capsule 30 mg  30 mg Oral Daily Theressa Millard, MD   30 mg at 02/26/16 1010  . enoxaparin (LOVENOX) injection 40 mg  40 mg Subcutaneous Q24H Donalynn Furlong [place], RPH   40 mg at 02/25/16 1618  . gabapentin (NEURONTIN) capsule 100 mg  100 mg Oral TID Delfina Redwood, MD   100 mg at 02/26/16 1010  . haloperidol (HALDOL) tablet 2 mg  2 mg Oral BID Delfina Redwood, MD   2 mg at 02/26/16 1010  . LORazepam (ATIVAN) injection 0-4 mg  0-4 mg Intravenous Q4H Delfina Redwood, MD   2 mg at 02/26/16 0830  . mometasone-formoterol (DULERA) 200-5 MCG/ACT inhaler 2 puff  2 puff Inhalation BID Delfina Redwood, MD   2 puff at 02/26/16 0746  . nicotine (NICODERM CQ - dosed in mg/24 hours) patch 21 mg  21 mg Transdermal Daily Dianne Dun, NP   21 mg at 02/26/16 1010  . ondansetron (ZOFRAN) tablet 4 mg  4 mg Oral Q6H PRN Theressa Millard, MD       Or  . ondansetron (ZOFRAN) injection 4 mg  4 mg Intravenous Q6H PRN Theressa Millard, MD   4 mg at 02/21/16 2300  . oxyCODONE (Oxy IR/ROXICODONE) immediate release tablet 5 mg  5 mg Oral Q6H PRN Delfina Redwood, MD   5 mg at 02/25/16 1228  . sodium chloride flush (NS) 0.9 % injection 10-40 mL  10-40 mL Intracatheter Q12H Corinna L  Conley Canal, MD   10 mL at 02/25/16 0941  . sodium chloride flush (NS) 0.9 % injection 10-40 mL  10-40 mL Intracatheter PRN Delfina Redwood, MD   10 mL at 02/22/16 2201  . thiamine (VITAMIN B-1) tablet 100 mg  100  mg Oral Daily Merryl Hacker, MD   100 mg at 02/26/16 1010  . traZODone (DESYREL) tablet 50 mg  50 mg Oral QHS PRN Delfina Redwood, MD        Musculoskeletal: Strength & Muscle Tone: decreased Gait & Station: unable to stand Patient leans: N/A  Psychiatric Specialty Exam: ROS generalized weakness and not feeling good and weakness of her right leg for unknown reason. Patient denied nausea, vomiting, abdominal pain, shortness of breath and chest pain. No Fever-chills, No Headache, No changes with Vision or hearing, reports vertigo No problems swallowing food or Liquids, No Chest pain, Cough or Shortness of Breath, No Abdominal pain, No Nausea or Vommitting, Bowel movements are regular, No Blood in stool or Urine, No dysuria, No new skin rashes or bruises, No new joints pains-aches,  No new weakness, tingling, numbness in any extremity, No recent weight gain or loss, No polyuria, polydypsia or polyphagia,  A full 10 point Review of Systems was done, except as stated above, all other Review of Systems were negative.  Blood pressure 136/64, pulse 65, temperature 98.5 F (36.9 C), temperature source Axillary, resp. rate 15, height _0  (1.626 m), weight 54 kg (119 lb 0.8 oz), SpO2 100 %.Body mass index is 20.42 kg/(m^2).  General Appearance: Disheveled  Eye Contact::  Good  Speech:  Clear and Coherent and Slow  Volume:  Decreased  Mood:  Anxious and Depressed  Affect:  Appropriate and Congruent  Thought Process:  Coherent and Goal Directed  Orientation:  Full (Time, Place, and Person)  Thought Content:  WDL  Suicidal Thoughts:  No  Homicidal Thoughts:  No  Memory:  Immediate;   Good Recent;   Fair  Judgement:  Intact  Insight:  Fair  Psychomotor Activity:  Decreased  Concentration:  Fair  Recall:  Good  Fund of Knowledge:Good  Language: Good  Akathisia:  NA  Handed:  Right  AIMS (if indicated):     Assets:  Communication Skills Desire for Improvement Financial  Resources/Insurance Housing Intimacy Leisure Time Resilience Social Support Transportation  ADL's:  Impaired  Cognition: WNL  Sleep:      Treatment Plan Summary: Daily contact with patient to assess and evaluate symptoms and progress in treatment and Medication management  Patient has no safety concerns and contract for safety  Patient CIWA is not significant at this time  Continue current medication management Cymbalta, Neurontin, Haldol for depression, anxiety and agitation  Refer to the psychiatric social service for substance abuse treatment program for rehabilitation and medically stable   Disposition: Patient may benefit from physical therapy when medically stable. Patient does not meet criteria for psychiatric inpatient admission. Supportive therapy provided about ongoing stressors.  Durward Parcel., MD 02/26/2016 12:01 PM

## 2016-02-26 NOTE — Consult Note (Signed)
Physical Medicine and Rehabilitation Consult Reason for Consult: Debilitation/sepsis/multi-medical Referring Physician: [facility]   HPI: Rhonda Mitchell is a 55 y.o. right handed female alcohol and tobacco abuse, COPD, hepatitis C. Presented 02/20/2016 with epigastric abdominal pain shortness of breath with nausea vomiting and diarrhea. History taken from chart review. Lives alone reported to be independent prior to admission and sedentary. Patient noted decreased appetite and insomnia for 2 days prior to admission. She drinks approximately 3-4 pints of liquor daily and has done so for many years. Alcohol level 298 on admission. Placed on detox protocol. Oxygen saturations 85% placed on 2 L oxygen. Abdominal films negative for obstruction. Urine culture Escherichia coli and placed on Rocephin. Subcutaneous Lovenox for DVT prophylaxis. Diet has been advanced to regular. Acute on chronic anemia 8.3-9.8 and monitored. Await plan for possible alcohol and drug rehabilitation. Patient extremely agitated on exam screaming that she wants her right leg cut off. Physical therapy evaluation completed 02/25/2016 noting mod assist for ambulation with recommendations of physical medicine rehabilitation consult.  Review of Systems  Unable to perform ROS: medical condition   Past Medical History  Diagnosis Date  . COPD (chronic obstructive pulmonary disease) (HCC)   . Anxiety   . Shortness of breath   . Arthritis   . GERD (gastroesophageal reflux disease)   . Full dentures   . Insomnia   . Alcohol abuse   . Hepatitis C   . Alcohol abuse   . History of MRSA infection   . History of encephalopathy    Past Surgical History  Procedure Laterality Date  . Arm surgery    . Hand surgery  1990    both rt/lt carpal tunnels  . Shoulder surgery      right and left-age [age]  . Orif ulnar / radial shaft fracture  1990    right-got infection-had total 10 surgeies 1990  . Orif ulnar fracture Left 05/23/2014      Procedure: OPEN REDUCTION INTERNAL FIXATION (ORIF) LEFT ULNAR FRACTURE;  Surgeon: Harvie JuniorJohn L Graves, MD;  Location: [facility];  Service: Orthopedics;  Laterality: Left;  . I&d extremity Right 05/07/2015    Procedure: IRRIGATION AND DEBRIDEMENT  ELBOW ;  Surgeon: Dominica SeverinWilliam Gramig, MD;  Location: [facility] OR;  Service: Orthopedics;  Laterality: Right;   Family History  Problem Relation Age of Onset  . Breast cancer Mother    Social History:  reports that she has been smoking Cigarettes.  She has a 20 pack-year smoking history. She has never used smokeless tobacco. She reports that she drinks alcohol. She reports that she does not use illicit drugs. Allergies: No Known Allergies Medications Prior to Admission  Medication Sig Dispense Refill  . albuterol (PROVENTIL HFA;VENTOLIN HFA) 108 (90 BASE) MCG/ACT inhaler Inhale 2 puffs into the lungs every 6 (six) hours as needed for wheezing or shortness of breath. (Patient not taking: Reported on 02/20/2016) 1 Inhaler 2  . benztropine (COGENTIN) 0.5 MG tablet Take 1 tablet (0.5 mg total) by mouth 2 (two) times daily as needed for tremors (EPS). (Patient not taking: Reported on 02/20/2016) 60 tablet 0  . budesonide-formoterol (SYMBICORT) 160-4.5 MCG/ACT inhaler Inhale 2 puffs into the lungs 2 (two) times daily. Reported on 02/20/2016    . DULoxetine (CYMBALTA) 30 MG capsule Take 1 capsule (30 mg total) by mouth daily. (Patient not taking: Reported on 02/20/2016) 30 capsule 0  . gabapentin (NEURONTIN) 100 MG capsule Take 1 capsule (100 mg total) by mouth 3 (three)  times daily. (Patient not taking: Reported on 02/20/2016) 90 capsule 0  . haloperidol (HALDOL) 2 MG tablet Take 1 tablet (2 mg total) by mouth 2 (two) times daily. (Patient not taking: Reported on 02/20/2016) 60 tablet 0  . ibuprofen (ADVIL,MOTRIN) 200 MG tablet Take 400 mg by mouth every 6 (six) hours as needed for moderate pain. Reported on 02/20/2016    . nicotine (NICODERM CQ - DOSED IN MG/24 HOURS) 21  mg/24hr patch Place 1 patch (21 mg total) onto the skin daily. (Patient not taking: Reported on 11/13/2015) 28 patch 0  . thiamine 100 MG tablet Take 1 tablet (100 mg total) by mouth daily. (Patient not taking: Reported on 02/20/2016) 30 tablet 0  . traZODone (DESYREL) 50 MG tablet Take 1 tablet (50 mg total) by mouth at bedtime as needed for sleep. (Patient not taking: Reported on 02/20/2016) 30 tablet 0  . vancomycin (VANCOCIN) 50 mg/mL oral solution Take 2.5 mLs (125 mg total) by mouth 4 (four) times daily. (Patient not taking: Reported on 02/20/2016) 40 mL 0    Home: Home Living Family/patient expects to be discharged to::  (Drug Rehab) Living Arrangements: Non-relatives/Friends  Functional History: Prior Function Level of Independence: Independent Comments: pt drank 3-4 pints of vodka daily and smokes 2 packs a cigarettes a day Functional Status:  Mobility: Bed Mobility Overal bed mobility: Needs Assistance Bed Mobility: Supine to Sit Supine to sit: Min assist, HOB elevated General bed mobility comments: assist to bring hips to EOB Transfers Overall transfer level: Needs assistance Equipment used: 1 person hand held assist Transfers: Sit to/from Stand Sit to Stand: Min assist, Mod assist General transfer comment: had to stand in front of patient with posterior tactile cues at hips to promote upright posture due to pt locking knees out in extension and leaning posteiorly Ambulation/Gait Ambulation/Gait assistance: Mod assist, +2 safety/equipment Ambulation Distance (Feet): 20 Feet Assistive device: 1 person hand held assist Gait Pattern/deviations: Step-to pattern, Wide base of support, Decreased stride length General Gait Details: pt held onto PTs forearms and PT in front of patient providing posterior tactile cues. pt with waddle gait pattern with short shuffled steps in wide base of support Gait velocity: slow    ADL:    Cognition: Cognition Overall Cognitive Status:  Impaired/Different from baseline Orientation Level: Disoriented to time, Disoriented to situation, Disoriented to place Cognition Arousal/Alertness: Awake/alert Behavior During Therapy: WFL for tasks assessed/performed Overall Cognitive Status: Impaired/Different from baseline Area of Impairment: Orientation, Attention, Memory, Safety/judgement, Awareness, Following commands, Problem solving Orientation Level: Disoriented to, Time Current Attention Level: Sustained Memory: Decreased short-term memory Following Commands: Follows multi-step commands inconsistently, Follows multi-step commands with increased time Safety/Judgement: Decreased awareness of safety, Decreased awareness of deficits (however is aware she needs to detox) Awareness: Emergent Problem Solving: Difficulty sequencing, Requires verbal cues, Requires tactile cues General Comments: pt reports "i'm going to rehab after this and not for just 30 days, 90 days because i'm not no spring buck."  Blood pressure 136/64, pulse 71, temperature 98.5 F (36.9 C), temperature source Axillary, resp. rate 18, height 5\' 4"  (1.626 m), weight 54 kg (119 lb 0.8 oz), SpO2 99 %. Physical Exam  Vitals reviewed. Constitutional: She appears well-developed and well-nourished.  55 year old female appearing older than stated age  HENT:  Head: Normocephalic.  Facial abrasions  Eyes: EOM are normal. No scleral icterus.  Neck: Normal range of motion. Neck supple. No thyromegaly present.  Cardiovascular: Normal rate and regular rhythm.   Respiratory: Effort normal. No  respiratory distress.  Decreased breath sounds at the bases  GI: Soft. Bowel sounds are normal. She exhibits no distension.  Musculoskeletal: She exhibits tenderness (Right lower extremity). She exhibits no edema.  Neurological:  Lethargic, anxious as well as agitated.  Follows simple commands.   alert and oriented 2 with increased time and cues Sensation? Intact light  touch Motor: Bilateral upper extremities 4 -/5 proximal distal Left lower extremity: Hip flexion, knee extension 4 -/5, ankle dorsi/plantar flexion 4+/5 Right lower extremity patient not willing to move Patient not allowing reflexes to be examined right lower extremity.   Skin: Skin is warm and dry.  Psychiatric: Her affect is labile and inappropriate. She is agitated, aggressive and slowed. Cognition and memory are impaired. She expresses impulsivity.   Results for orders placed or performed during the hospital encounter of 02/19/16 (from the past 24 hour(s))  TSH     Status: None   Collection Time: 02/25/16 10:08 AM  Result Value Ref Range   TSH 2.875 0.350 - 4.500 uIU/mL  Vitamin B12     Status: None   Collection Time: 02/25/16 10:09 AM  Result Value Ref Range   Vitamin B-12 570 180 - 914 pg/mL   No results found.  Assessment/Plan: Diagnosis: Debilitation/sepsis/multi-medical Labs independently reviewed.  Records reviewed and summated above.  1. Does the need for close, 24 hr/day medical supervision in concert with the patient's rehab needs make it unreasonable for this patient to be served in a less intensive setting? Potentially  2. Co-Morbidities requiring supervision/potential complications: alcohol and tobacco abuse (Council when appropriate), COPD (continue O2, monitor RR and O2 sats with increased activity), hepatitis C (avoid hepatotoxic meds), UTI (Continue abx), tachypnea (monitor RR and O2 Sats with increased physical exertion), hypomagnesemia (cont to monitor and replete as necessary), Depression (ensure mood does not limit progress in therapies, follow Psych recs)  3. Due to bladder management, bowel management, safety, disease management, medication administration, pain management and patient education, does the patient require 24 hr/day rehab nursing? Yes 4. Does the patient require coordinated care of a physician, rehab nurse, PT (1-2 hrs/day, 5 days/week), OT (1-2  hrs/day, 5 days/week) and SLP (1-2 hrs/day, 5 days/week) to address physical and functional deficits in the context of the above medical diagnosis(es)? Potentially Addressing deficits in the following areas: balance, endurance, locomotion, strength, transferring, bowel/bladder control, dressing, feeding, toileting, cognition and psychosocial support 5. Can the patient actively participate in an intensive therapy program of at least 3 hrs of therapy per day at least 5 days per week? Potentially 6. The potential for patient to make measurable gains while on inpatient rehab is excellent 7. Anticipated functional outcomes upon discharge from inpatient rehab are modified independent and supervision  with PT, modified independent and supervision with OT, supervision and min assist with SLP. 8. Estimated rehab length of stay to reach the above functional goals is: 19-22 days. 9. Does the patient have adequate social supports and living environment to accommodate these discharge functional goals? No 10. Anticipated D/C setting: Other 11. Anticipated post D/C treatments: HH therapy and Home excercise program 12. Overall Rehab/Functional Prognosis: good  RECOMMENDATIONS: This patient's condition is appropriate for continued rehabilitative care in the following setting: Pt with AMS and is refusing manipulation of RLE.  She is unable to tolerate 3 hours therapy at present.  Further, pt will likely make significant gains if/once mental status improves.  More appropriate discharge disposition would be rehabilitation for substance abuse.  rehab Patient has agreed to  participate in recommended program. No Note that insurance prior authorization may be required for reimbursement for recommended care.  Comment: Rehab Admissions Coordinator to follow up.  Rehab Admissions Coordinator to follow up. 02/26/2016

## 2016-02-26 NOTE — Progress Notes (Signed)
   02/25/16 1800  What Happened  Was fall witnessed? No  Was patient injured? No  Patient found on floor  Found by Staff-comment Vicente Serene(Steven Coltrane, NT)  Stated prior activity other (comment) (patient in bed)  Follow Up  MD notified Dr. Lendell CapriceSullivan  Time MD notified [phone]  Family notified No- patient refusal (only contact is fiance who is truck driver)  Additional tests No  Progress note created (see row info) Yes   Rhonda Mitchell Joaquim LaiA Rhonda Jentz, RN

## 2016-02-26 NOTE — Progress Notes (Signed)
Patient found sitting at end of bed (on mattress) while bed alarm on middle sensitivity. Patient had removed oxygen and telemetry leads and wanted to walk to "store" with this RN. Asked this RN to retreive items from locations within her house as if we were in her home at this time. Patient reoriented to time, place, and situation. Patient expressed frustration while crying because she is still here and states she feels like a prisoner. Patient had incontinent episode. This RN cleaned patient, changed all linens, resettled patient back to lying in bed, placed telemetry leads back on. Patient requested any medication that she could have (haldol, ativan). This RN informed patient that I would check if she could have some yet. Patient fell asleep before this RN had left the room. Will continue to monitor closely.

## 2016-02-26 NOTE — Progress Notes (Signed)
PATIENT DETAILS Name: Rhonda Mitchell Age: 55 y.o. Sex: female Date of Birth: Mar 01, 1961 Admit Date: 02/19/2016 Admitting Physician Ron ParkerHarvette C Jenkins, MD PCP:No primary care provider on file.  Subjective: Lethargic/DPT-but easily arousable. Answers all my questions appropriately. Claims that she is significantly weak-particularly in the lower extremities-and this has been going on for a few weeks prior to this admission. She claims that her right leg is much weaker than the left.  Assessment/Plan: Principal Problem: Alcohol withdrawal: better, suspect at the tail end of withdrawal symptoms. Continue Ativan per protocol.   Active Problems: Generalized weakness: Most profound in her lower extremities-right more than left. Per patient, she has known history of degenerative disc disease in her back, and 2-3 weeks prior to admission was having troubles with gait due to weakness in the lower extremities. On exam, able to lift right leg against gravity and minimal resistance-appears to have a right foot drop. Left leg is his week-but appears to be much better than left. Suspect weakness has worsened due to deconditioning, however will get an MRI of her lumbosacral spine for better evaluation. TSH/vitamin B12 within normal limits.  COPD: Lungs clear, continue bronchodilators.  Hypokalemia/hypomagnesemia: Suspect secondary to GI loss and alcohol. Recheck.   Anemia:  Secondary to chronic disease-no evidence of blood loss. Follow.  Thrombocytopenia: Suspect secondary to alcohol use, improving.  Escherichia coli UTI: Completed course of antibiotics.  History of chronic hepatitis C: Stable for outpatient follow-up.  History of depression/alcohol induced mood disorder: Hospital course has been complicated by alcohol withdrawal delirium, however she continues to appear very depressed and withdrawn. Continue Haldol, Cymbalta, Neurontin-will consult psychiatry to see if medications  need to be changed/optimized.  Disposition: Remain inpatient-remain in SDU for another 24 hours  Antimicrobial agents  See below  Anti-infectives    Start     Dose/Rate Route Frequency Ordered Stop   02/22/16 1800  sulfamethoxazole-trimethoprim (BACTRIM DS,SEPTRA DS) 800-160 MG per tablet 1 tablet  Status:  Discontinued     1 tablet Oral Every 12 hours 02/22/16 1000 02/24/16 0944   02/21/16 0300  cefTRIAXone (ROCEPHIN) 1 g in dextrose 5 % 50 mL IVPB  Status:  Discontinued     1 g 100 mL/hr over 30 Minutes Intravenous Every 24 hours 02/20/16 0436 02/22/16 0959   02/20/16 1030  metroNIDAZOLE (FLAGYL) IVPB 500 mg  Status:  Discontinued     500 mg 100 mL/hr over 60 Minutes Intravenous Every 8 hours 02/20/16 1030 02/21/16 1146   02/20/16 1000  vancomycin (VANCOCIN) 50 mg/mL oral solution 125 mg  Status:  Discontinued     125 mg Oral 4 times daily 02/20/16 0833 02/20/16 1030   02/20/16 0300  cefTRIAXone (ROCEPHIN) 1 g in dextrose 5 % 50 mL IVPB     1 g 100 mL/hr over 30 Minutes Intravenous  Once 02/20/16 0254 02/20/16 0459      DVT Prophylaxis: SCD's  Code Status: Full code N  Family Communication None at bedside  Procedures: None  CONSULTS:  None  Time spent 30 minutes-Greater than 50% of this time was spent in counseling, explanation of diagnosis, planning of further management, and coordination of care.  MEDICATIONS: Scheduled Meds: . benztropine  0.5 mg Oral BID  . cloNIDine  0.1 mg Oral BID  . DULoxetine  30 mg Oral Daily  . enoxaparin (LOVENOX) injection  40 mg Subcutaneous Q24H  . gabapentin  100 mg Oral TID  .  haloperidol  2 mg Oral BID  . LORazepam  0-4 mg Intravenous Q4H  . mometasone-formoterol  2 puff Inhalation BID  . nicotine  21 mg Transdermal Daily  . sodium chloride flush  10-40 mL Intracatheter Q12H  . thiamine  100 mg Oral Daily   Continuous Infusions:  PRN Meds:.acetaminophen **OR** acetaminophen, albuterol, alum & mag hydroxide-simeth,  ondansetron **OR** ondansetron (ZOFRAN) IV, oxyCODONE, sodium chloride flush, traZODone    PHYSICAL EXAM: Vital signs in last 24 hours: Filed Vitals:   02/25/16 2024 02/26/16 0000 02/26/16 0400 02/26/16 0746  BP:  155/96 136/64   Pulse:  71  65  Temp:  97.5 F (36.4 C) 98.5 F (36.9 C)   TempSrc:  Oral Axillary   Resp:  Height:      Weight:      SpO2: 100% 99% 99% 100%    Weight change:  Filed Weights   02/20/16 1956  Weight: 54 kg (119 lb 0.8 oz)   Body mass index is 20.42 kg/(m^2).   Gen Exam: Awake and alert-lethargic-but not in any distress. Speech slow but clear.  Neck: Supple, No JVD.   Chest: B/L Clear.   CVS: S1 S2 Regular, no murmurs.  Abdomen: soft, BS +, non tender, non distended.  Extremities: + edema, lower extremities warm to touch Neurologic:  Difficult exam-but appears to have a right foot drop. Right lower extremity approximately 3/5, left lower extremity 3-4/5. Has generalized weakness as well.  Skin: No Rash.   Wounds: N/A.    Intake/Output from previous day:  Intake/Output Summary (Last 24 hours) at 02/26/16 1102 Last data filed at 02/26/16 1000  Gross per 24 hour  Intake    720 ml  Output   1550 ml  Net   -830 ml     LAB RESULTS: CBC  Recent Labs Lab 02/19/16 2106 02/20/16 0522 02/22/16 0328 02/25/16 0445  WBC 6.5 8.1 4.2 3.7*  HGB 9.3* 8.5* 8.9* 8.3*  HCT 29.9* 26.4* 29.4* 27.1*  PLT 155 99* 116* 144*  MCV 94.0 95.0 95.8 96.4  MCH 29.2 30.6 29.0 29.5  MCHC 31.1 32.2 30.3 30.6  RDW 22.4* 22.2* 20.8* 21.3*    Chemistries   Recent Labs Lab 02/19/16 2106 02/20/16 0522 02/22/16 0328 02/22/16 1050 02/23/16 0545 02/25/16 0445  NA 148* 141 141  --  141 142  K 4.7 3.6 3.3*  --  4.2 3.3*  CL 108 105 103  --  105 113*  CO2 --  21* 21*  GLUCOSE 132* 89 89  --  84 113*  BUN 6 <5* <5*  --  7 <5*  CREATININE 0.82 0.55 0.60  --  0.69 0.44  CALCIUM 8.0* 7.1* 7.0*  --  8.3* 7.6*  MG  --   --   --  1.3*  --    --     CBG:  Recent Labs Lab 02/24/16 0333  GLUCAP 68    GFR Estimated Creatinine Clearance: 68.5 mL/min (by C-G formula based on Cr of 0.44).  Coagulation profile No results for input(s): INR, PROTIME in the last 168 hours.  Cardiac Enzymes  Recent Labs Lab 02/19/16 2313  TROPONINI <0.03    Invalid input(s): POCBNP No results for input(s): DDIMER in the last 72 hours. No results for input(s): HGBA1C in the last 72 hours. No results for input(s): CHOL, HDL, LDLCALC, TRIG, CHOLHDL, LDLDIRECT in the last 72 hours.  Recent Labs  02/25/16 1008  TSH  2.875    Recent Labs  02/25/16 1009  VITAMINB12 570   No results for input(s): LIPASE, AMYLASE in the last 72 hours.  Urine Studies No results for input(s): UHGB, CRYS in the last 72 hours.  Invalid input(s): UACOL, UAPR, USPG, UPH, UTP, UGL, UKET, UBIL, UNIT, UROB, ULEU, UEPI, UWBC, URBC, UBAC, CAST, UCOM, BILUA  MICROBIOLOGY: Recent Results (from the past 240 hour(s))  Urine culture     Status: None   Collection Time: 02/19/16 10:29 PM  Result Value Ref Range Status   Specimen Description URINE, RANDOM  Final   Special Requests NONE  Final   Culture >=100,000 COLONIES/mL ESCHERICHIA COLI  Final   Report Status 02/22/2016 FINAL  Final   Organism ID, Bacteria ESCHERICHIA COLI  Final      Susceptibility   Escherichia coli - MIC*    AMPICILLIN 8 SENSITIVE Sensitive     CEFAZOLIN <=4 SENSITIVE Sensitive     CEFTRIAXONE <=1 SENSITIVE Sensitive     CIPROFLOXACIN <=0.25 SENSITIVE Sensitive     GENTAMICIN <=1 SENSITIVE Sensitive     IMIPENEM <=0.25 SENSITIVE Sensitive     NITROFURANTOIN <=16 SENSITIVE Sensitive     TRIMETH/SULFA <=20 SENSITIVE Sensitive     AMPICILLIN/SULBACTAM 4 SENSITIVE Sensitive     PIP/TAZO <=4 SENSITIVE Sensitive     * >=100,000 COLONIES/mL ESCHERICHIA COLI  MRSA PCR Screening     Status: None   Collection Time: 02/20/16  6:41 PM  Result Value Ref Range Status   MRSA by PCR NEGATIVE  NEGATIVE Final    Comment:        The GeneXpert MRSA Assay (FDA approved for NASAL specimens only), is one component of a comprehensive MRSA colonization surveillance program. It is not intended to diagnose MRSA infection nor to guide or monitor treatment for MRSA infections.     RADIOLOGY STUDIES/RESULTS: Dg Abd Acute W/chest  02/19/2016  CLINICAL DATA:  55 year old female with abdominal pain EXAM: DG ABDOMEN ACUTE W/ 1V CHEST COMPARISON:  The CT dated 12/18/2015 FINDINGS: The lungs are clear. No pleural effusion or pneumothorax. The cardiac silhouette is within normal limits. There is no evidence of bowel obstruction or free air. No radiopaque calculi identified. IMPRESSION: No radiopaque foreign object. There is degenerative changes of the osseous structures. No acute fracture. Electronically Signed   By: Elgie Collard M.D.   On: 02/19/2016 23:42    Jeoffrey Massed, MD  [facility] Pager:[phone]  If 7PM-7AM, please contact night-coverage [email or web address] Password [email or web address] 02/26/2016, 11:02 AM   LOS: 6 days

## 2016-02-27 DIAGNOSIS — B182 Chronic viral hepatitis C: Secondary | ICD-10-CM

## 2016-02-27 LAB — CBC
HCT: 30.5 % — ABNORMAL LOW (ref 36.0–46.0)
Hemoglobin: 9.7 g/dL — ABNORMAL LOW (ref 12.0–15.0)
MCH: 30.8 pg (ref 26.0–34.0)
MCHC: 31.8 g/dL (ref 30.0–36.0)
MCV: 96.8 fL (ref 78.0–100.0)
PLATELETS: 188 10*3/uL (ref 150–400)
RBC: 3.15 MIL/uL — AB (ref 3.87–5.11)
RDW: 21.7 % — AB (ref 11.5–15.5)
WBC: 4.4 10*3/uL (ref 4.0–10.5)

## 2016-02-27 LAB — BASIC METABOLIC PANEL
ANION GAP: 10 (ref 5–15)
BUN: <5 mg/dL — ABNORMAL LOW (ref 6–20)
CALCIUM: 8.7 mg/dL — AB (ref 8.9–10.3)
CO2: 24 mmol/L (ref 22–32)
Chloride: 107 mmol/L (ref 101–111)
Creatinine, Ser: 0.55 mg/dL (ref 0.44–1.00)
GLUCOSE: 109 mg/dL — AB (ref 65–99)
POTASSIUM: 4.1 mmol/L (ref 3.5–5.1)
SODIUM: 141 mmol/L (ref 135–145)

## 2016-02-27 LAB — MAGNESIUM: MAGNESIUM: 1.6 mg/dL — AB (ref 1.7–2.4)

## 2016-02-27 LAB — VITAMIN B1: VITAMIN B1 (THIAMINE): 242.5 nmol/L — AB (ref 66.5–200.0)

## 2016-02-27 MED ORDER — LORAZEPAM 2 MG/ML IJ SOLN
INTRAMUSCULAR | Status: AC
  Filled 2016-02-27: qty 1

## 2016-02-27 MED ORDER — MAGNESIUM SULFATE 2 GM/50ML IV SOLN
2.0000 g | Freq: Once | INTRAVENOUS | Status: AC
  Administered 2016-02-27: 2 g via INTRAVENOUS
  Filled 2016-02-27: qty 50

## 2016-02-27 MED ORDER — HALOPERIDOL LACTATE 5 MG/ML IJ SOLN
2.0000 mg | Freq: Four times a day (QID) | INTRAMUSCULAR | Status: DC | PRN
  Administered 2016-02-27 – 2016-03-04 (×13): 2 mg via INTRAVENOUS
  Filled 2016-02-27 (×14): qty 1

## 2016-02-27 MED ORDER — LORAZEPAM 2 MG/ML IJ SOLN
1.0000 mg | INTRAMUSCULAR | Status: DC | PRN
  Administered 2016-02-27: 1 mg via INTRAVENOUS
  Administered 2016-02-27 – 2016-02-28 (×3): 2 mg via INTRAVENOUS
  Administered 2016-02-28: 1 mg via INTRAVENOUS
  Administered 2016-02-28 – 2016-02-29 (×5): 2 mg via INTRAVENOUS
  Administered 2016-03-01: 1 mg via INTRAVENOUS
  Administered 2016-03-01 – 2016-03-04 (×8): 2 mg via INTRAVENOUS
  Filled 2016-02-27 (×20): qty 1

## 2016-02-27 NOTE — Consult Note (Signed)
NEURO HOSPITALIST CONSULT NOTE   Requestig physician: Dr. Jerral Ralph   Reason for Consult: LE weakness   History obtained from:  Patient and Chart     HPI:                                                                                                                                          Rhonda Mitchell is an 55 y.o. female presenting to hospital with epigastric pain. She drinks 3-4 pints of liquor daily and has done so for many years and her last drink was last night. Her Alcohol level was found at 298, and she presents today requesting Detox Rx. Her O2 sats were found at 85% and she was placed on 2 liters NCO2. She was also found to have a UTI and was placed on empiric IV Rocephin,and the Alcohol Withdrawal Protocol was initiated. While hospitalized she has been very agitated. Due to to complaints of LE weakness neurology was consulted.   TSH/vitamin B12 within normal limits. L spine MRI shows Uncovering of a broad-based disc protrusion and facet spurring at L5-S1 leads to moderate to severe foraminal stenosis bilaterally, left greater than right  Past Medical History  Diagnosis Date  . COPD (chronic obstructive pulmonary disease) (HCC)   . Anxiety   . Shortness of breath   . Arthritis   . GERD (gastroesophageal reflux disease)   . Full dentures   . Insomnia   . Alcohol abuse   . Hepatitis C   . Alcohol abuse   . History of MRSA infection   . History of encephalopathy     Past Surgical History  Procedure Laterality Date  . Arm surgery    . Hand surgery  1990    both rt/lt carpal tunnels  . Shoulder surgery      right and left-age [age]  . Orif ulnar / radial shaft fracture  1990    right-got infection-had total 10 surgeies 1990  . Orif ulnar fracture Left 05/23/2014    Procedure: OPEN REDUCTION INTERNAL FIXATION (ORIF) LEFT ULNAR FRACTURE;  Surgeon: Harvie Junior, MD;  Location: [facility];  Service: Orthopedics;  Laterality:  Left;  . I&d extremity Right 05/07/2015    Procedure: IRRIGATION AND DEBRIDEMENT  ELBOW ;  Surgeon: Dominica Severin, MD;  Location: [facility] OR;  Service: Orthopedics;  Laterality: Right;    Family History  Problem Relation Age of Onset  . Breast cancer Mother      Social History:  reports that she has been smoking Cigarettes.  She has a 20 pack-year smoking history. She has never used smokeless tobacco. She reports that she drinks alcohol. She reports that she does not use illicit drugs.  No Known Allergies  MEDICATIONS:  Prior to Admission:  Prescriptions prior to admission  Medication Sig Dispense Refill Last Dose  . albuterol (PROVENTIL HFA;VENTOLIN HFA) 108 (90 BASE) MCG/ACT inhaler Inhale 2 puffs into the lungs every 6 (six) hours as needed for wheezing or shortness of breath. (Patient not taking: Reported on 02/20/2016) 1 Inhaler 2 Not Taking at Unknown time  . benztropine (COGENTIN) 0.5 MG tablet Take 1 tablet (0.5 mg total) by mouth 2 (two) times daily as needed for tremors (EPS). (Patient not taking: Reported on 02/20/2016) 60 tablet 0 Not Taking at Unknown time  . budesonide-formoterol (SYMBICORT) 160-4.5 MCG/ACT inhaler Inhale 2 puffs into the lungs 2 (two) times daily. Reported on 02/20/2016   Not Taking at Unknown time  . DULoxetine (CYMBALTA) 30 MG capsule Take 1 capsule (30 mg total) by mouth daily. (Patient not taking: Reported on 02/20/2016) 30 capsule 0 Not Taking at Unknown time  . gabapentin (NEURONTIN) 100 MG capsule Take 1 capsule (100 mg total) by mouth 3 (three) times daily. (Patient not taking: Reported on 02/20/2016) 90 capsule 0 Not Taking at Unknown time  . haloperidol (HALDOL) 2 MG tablet Take 1 tablet (2 mg total) by mouth 2 (two) times daily. (Patient not taking: Reported on 02/20/2016) 60 tablet 0 Not Taking at Unknown time  . ibuprofen (ADVIL,MOTRIN) 200 MG tablet  Take 400 mg by mouth every 6 (six) hours as needed for moderate pain. Reported on 02/20/2016   Not Taking at Unknown time  . nicotine (NICODERM CQ - DOSED IN MG/24 HOURS) 21 mg/24hr patch Place 1 patch (21 mg total) onto the skin daily. (Patient not taking: Reported on 11/13/2015) 28 patch 0 Not Taking at Unknown time  . thiamine 100 MG tablet Take 1 tablet (100 mg total) by mouth daily. (Patient not taking: Reported on 02/20/2016) 30 tablet 0 Not Taking at Unknown time  . traZODone (DESYREL) 50 MG tablet Take 1 tablet (50 mg total) by mouth at bedtime as needed for sleep. (Patient not taking: Reported on 02/20/2016) 30 tablet 0 Not Taking at Unknown time  . vancomycin (VANCOCIN) 50 mg/mL oral solution Take 2.5 mLs (125 mg total) by mouth 4 (four) times daily. (Patient not taking: Reported on 02/20/2016) 40 mL 0 Not Taking at Unknown time   Scheduled: . benztropine  0.5 mg Oral BID  . cloNIDine  0.1 mg Oral BID  . DULoxetine  30 mg Oral Daily  . enoxaparin (LOVENOX) injection  40 mg Subcutaneous Q24H  . gabapentin  100 mg Oral TID  . haloperidol  2 mg Oral BID  . LORazepam      . mometasone-formoterol  2 puff Inhalation BID  . nicotine  21 mg Transdermal Daily  . sodium chloride flush  10-40 mL Intracatheter Q12H  . thiamine  100 mg Oral Daily     ROS:  History obtained from the patient  General ROS: negative for - chills, fatigue, fever, night sweats, weight gain or weight loss Psychological ROS: negative for - behavioral disorder, hallucinations, memory difficulties, mood swings or suicidal ideation Ophthalmic ROS: negative for - blurry vision, double vision, eye pain or loss of vision ENT ROS: negative for - epistaxis, nasal discharge, oral lesions, sore throat, tinnitus or vertigo Allergy and Immunology ROS: negative for - hives or itchy/watery  eyes Hematological and Lymphatic ROS: negative for - bleeding problems, bruising or swollen lymph nodes Endocrine ROS: negative for - galactorrhea, hair pattern changes, polydipsia/polyuria or temperature intolerance Respiratory ROS: negative for - cough, hemoptysis, shortness of breath or wheezing Cardiovascular ROS: negative for - chest pain, dyspnea on exertion, edema or irregular heartbeat Gastrointestinal ROS: negative for - abdominal pain, diarrhea, hematemesis, nausea/vomiting or stool incontinence Genito-Urinary ROS: negative for - dysuria, hematuria, incontinence or urinary frequency/urgency Musculoskeletal ROS: negative for - joint swelling or muscular weakness Neurological ROS: as noted in HPI Dermatological ROS: negative for rash and skin lesion changes   Blood pressure 112/86, pulse 79, temperature 98.4 F (36.9 C), temperature source Oral, resp. rate 26, height 5\' 4"  (1.626 m), weight 54 kg (119 lb 0.8 oz), SpO2 99 %.   Neurologic Examination:                                                                                                      HEENT-  Normocephalic, no lesions, without obvious abnormality.  Normal external eye and conjunctiva.  Normal TM's bilaterally.  Normal auditory canals and external ears. Normal external nose, mucus membranes and septum.  Normal pharynx. Cardiovascular- S1, S2 normal, pulses palpable throughout   Lungs- chest clear, no wheezing, rales, normal symmetric air entry Abdomen- normal findings: bowel sounds normal Extremities- no edema Lymph-no adenopathy palpable Musculoskeletal-no joint tenderness, deformity or swelling Skin-warm and dry, no hyperpigmentation, vitiligo, or suspicious lesions  Neurological Examination Mental Status: Alert, over exaggerates pain and is very labile.  Speech fluent without evidence of aphasia.  Able to follow 3 step commands without difficulty. Cranial Nerves: II:  Visual fields grossly normal, pupils equal,  round, reactive to light and accommodation III,IV, VI: ptosis not present, extra-ocular motions intact bilaterally V,VII: smile asymmetric but adentulous, facial light touch sensation normal bilaterally VIII: hearing normal bilaterally IX,X: uvula rises symmetrically XI: bilateral shoulder shrug XII: midline tongue extension Motor: Right : Upper extremity   5/5    Left:     Upper extremity   5/5  Lower extremity   5/5     Lower extremity   5/5 --poor effort throughout with give way weakness and complains of pain with all movements.  Tone and bulk:normal tone throughout; no atrophy noted Sensory: Pinprick and light touch intact throughout, bilaterally Deep Tendon Reflexes: 2+ and symmetric throughout UE, brisk 3+ in KJ no AJ but would not relax.  Plantars: Right: downgoing   Left: downgoing Cerebellar: normal finger-to-nose,  Gait: not tested      Lab Results: Basic Metabolic Panel:  Recent Labs Lab 02/22/16 0328 02/22/16 1050 02/23/16  4098 02/25/16 0445 02/26/16 1137 02/27/16 0250  NA 141  --  141 142 140 141  K 3.3*  --  4.2 3.3* 4.0 4.1  CL 103  --  105 113* 111 107  CO2 26  --  21* 21* 22 24  GLUCOSE 89  --  84 113* 132* 109*  BUN <5*  --  7 <5* <5* <5*  CREATININE 0.60  --  0.69 0.44 0.55 0.55  CALCIUM 7.0*  --  8.3* 7.6* 8.4* 8.7*  MG  --  1.3*  --   --  1.6* 1.6*    Liver Function Tests: No results for input(s): AST, ALT, ALKPHOS, BILITOT, PROT, ALBUMIN in the last 168 hours. No results for input(s): LIPASE, AMYLASE in the last 168 hours. No results for input(s): AMMONIA in the last 168 hours.  CBC:  Recent Labs Lab 02/22/16 0328 02/25/16 0445 02/25/16 1009 02/27/16 0250  WBC 4.2 3.7*  --  4.4  HGB 8.9* 8.3*  --  9.7*  HCT 29.4* 27.1* 30.2* 30.5*  MCV 95.8 96.4  --  96.8  PLT 116* 144*  --  188    Cardiac Enzymes: No results for input(s): CKTOTAL, CKMB, CKMBINDEX, TROPONINI in the last 168 hours.  Lipid Panel: No results for input(s): CHOL,  TRIG, HDL, CHOLHDL, VLDL, LDLCALC in the last 168 hours.  CBG:  Recent Labs Lab 02/24/16 0333  GLUCAP 68    Microbiology: Results for orders placed or performed during the hospital encounter of 02/19/16  Urine culture     Status: None   Collection Time: 02/19/16 10:29 PM  Result Value Ref Range Status   Specimen Description URINE, RANDOM  Final   Special Requests NONE  Final   Culture >=100,000 COLONIES/mL ESCHERICHIA COLI  Final   Report Status 02/22/2016 FINAL  Final   Organism ID, Bacteria ESCHERICHIA COLI  Final      Susceptibility   Escherichia coli - MIC*    AMPICILLIN 8 SENSITIVE Sensitive     CEFAZOLIN <=4 SENSITIVE Sensitive     CEFTRIAXONE <=1 SENSITIVE Sensitive     CIPROFLOXACIN <=0.25 SENSITIVE Sensitive     GENTAMICIN <=1 SENSITIVE Sensitive     IMIPENEM <=0.25 SENSITIVE Sensitive     NITROFURANTOIN <=16 SENSITIVE Sensitive     TRIMETH/SULFA <=20 SENSITIVE Sensitive     AMPICILLIN/SULBACTAM 4 SENSITIVE Sensitive     PIP/TAZO <=4 SENSITIVE Sensitive     * >=100,000 COLONIES/mL ESCHERICHIA COLI  MRSA PCR Screening     Status: None   Collection Time: 02/20/16  6:41 PM  Result Value Ref Range Status   MRSA by PCR NEGATIVE NEGATIVE Final    Comment:        The GeneXpert MRSA Assay (FDA approved for NASAL specimens only), is one component of a comprehensive MRSA colonization surveillance program. It is not intended to diagnose MRSA infection nor to guide or monitor treatment for MRSA infections.     Coagulation Studies: No results for input(s): LABPROT, INR in the last 72 hours.  Imaging: Mr Lumbar Spine W Wo Contrast  02/26/2016  CLINICAL DATA:  Profound bilateral lower extremity weakness, right greater than left. EXAM: MRI LUMBAR SPINE WITHOUT AND WITH CONTRAST TECHNIQUE: Multiplanar and multiecho pulse sequences of the lumbar spine were obtained without and with intravenous contrast. CONTRAST:  10 mL MultiHance COMPARISON:  CT abdomen and pelvis  12/18/2015. FINDINGS: Normal signal is present in the conus medullaris which terminates at L1. Bilateral L5 pars defects are present. Grade  1 anterolisthesis at L5-S1 is stable, measuring 6 mm. There is chronic fatty marrow infiltration of the L5 vertebral body and as well as the inferior endplate of L4 and superior endplate of S1. Marrow signal, vertebral body heights, and alignment are otherwise normal. Limited imaging of the abdomen is unremarkable. The disc levels at L2-3 and above are normal. L3-4: A mild broad-based disc protrusion is present. There is mild facet hypertrophy bilaterally. No significant stenosis is present. L4-5: A broad-based disc protrusion is present. There is mild facet hypertrophy bilaterally. Slight retrolisthesis is noted. The disc extends into the right neural foramen without significant stenosis. L5-S1: There is uncovering of a broad-based disc protrusion associated with the anterolisthesis. In combination with facet spurring this leads to moderate to severe foraminal narrowing bilaterally, left greater than right. IMPRESSION: 1. 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 2. Uncovering of a broad-based disc protrusion and facet spurring at L5-S1 leads to moderate to severe foraminal stenosis bilaterally, left greater than right. 3. Slight retrolisthesis and a rightward disc protrusion at L4-5 without significant stenosis. 4. Mild broad-based disc protrusion and facet hypertrophy at L3-4 without significant stenosis. Electronically Signed   By: Marin Robertshristopher  Mattern M.D.   On: 02/26/2016 19:44    Felicie MornDavid Smith PA-C [facility] [phone]  02/27/2016, 12:50 PM   Assessment/Plan: 55 YO female with 2 week self proclaimed history of LE weakness. Exam shows give way weakness and poor effort throughout with labile demeanor. Exam is non focal other than brisk knee jerks. Possible thoracic myelopathy will need to be ruled  out  Recommend: MRI thoracic spine to rule out thoracic cord lesion. Agree with PT intervention.   We will see in follow-up following thoracic spine MRI study.   I personally participated in this patient's evaluation and management, including formulating the above clinical impression and management recommendations.  Venetia MaxonR Dartanion Teo M.D. [facility] [phone]

## 2016-02-27 NOTE — Progress Notes (Signed)
RN to room patient bed alarm going off. Patient attempting to stand, stated she was getting her stuff ready to leave. Re-oriented to place time and situation. Patient still attempted to stand, became combative and verbally abusive to staff. MD paged PRN ativan order given. Patient left in bed with bed alarm on,nurse call light given. Will continue to monitor patient. Luther Parodyaitlin Lum KeasFoecking,RN

## 2016-02-27 NOTE — Progress Notes (Signed)
Report given to receiving nurse Connie,RN on 6 [place].

## 2016-02-27 NOTE — Progress Notes (Signed)
Inpatient Rehabilitation  Patient left unit for MRI.  I will follow up to assist with physical rehab venue options once medical work up complete.    Charlane FerrettiMelissa Brieann Osinski, M.A., CCC/SLP Admission Coordinator  [facility]  Cell [phone]

## 2016-02-27 NOTE — Progress Notes (Signed)
PATIENT DETAILS Name: Rhonda Mitchell Age: 55 y.o. Sex: female Date of Birth: 10/22/1961 Admit Date: 02/19/2016 Admitting Physician Ron Parker, MD PCP:No primary care provider on file.  Subjective: Much more awake and alert this morning compared to the past few days. Still continues to have significant weakness and lower extremities.  Assessment/Plan: Principal Problem: Alcohol withdrawal: better, suspect at the tail end of withdrawal symptoms. Continue Ativan per protocol.   Active Problems: Generalized weakness: Most profound in her lower extremities-right more than left. Per patient, she has known history of degenerative disc disease in her back, and 2-3 weeks prior to admission was having troubles with gait due to weakness in the lower extremities and was crawling at home.  Continues to have lower extremity weakness right> left on exam ,? Mild right foot drop as well. 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 MRI of the  Lumbosacral spine discussed with neurosurgery-Dr. Yetta Barre over the phone-  Will does not thinkthat any of these changes can account for patient's symptoms.TSH/vitamin B12 within normal limits. Continue PT evaluation, suspect SNF on discharge. Neurology consulted.  COPD: Lungs clear, continue bronchodilators.  Hypokalemia/hypomagnesemia: Suspect secondary to GI loss and alcohol.    Anemia:  Secondary to chronic disease-no evidence of blood loss. Follow.  Thrombocytopenia: Suspect secondary to alcohol use, improving.  Escherichia coli UTI: Completed course of antibiotics.  History of chronic hepatitis C: Stable for outpatient follow-up.  History of depression/alcohol induced mood disorder: Hospital course has been complicated by alcohol withdrawal delirium, however as been noted to be depressed and crying. Psychiatry consulted , plans are to continue with  Haldol, Cymbalta, Neurontin  Disposition: Remain inpatient-  transfer to  MedSurg unit  Antimicrobial agents  See below  Anti-infectives    Start     Dose/Rate Route Frequency Ordered Stop   02/22/16 1800  sulfamethoxazole-trimethoprim (BACTRIM DS,SEPTRA DS) 800-160 MG per tablet 1 tablet  Status:  Discontinued     1 tablet Oral Every 12 hours 02/22/16 1000 02/24/16 0944   02/21/16 0300  cefTRIAXone (ROCEPHIN) 1 g in dextrose 5 % 50 mL IVPB  Status:  Discontinued     1 g 100 mL/hr over 30 Minutes Intravenous Every 24 hours 02/20/16 0436 02/22/16 0959   02/20/16 1030  metroNIDAZOLE (FLAGYL) IVPB 500 mg  Status:  Discontinued     500 mg 100 mL/hr over 60 Minutes Intravenous Every 8 hours 02/20/16 1030 02/21/16 1146   02/20/16 1000  vancomycin (VANCOCIN) 50 mg/mL oral solution 125 mg  Status:  Discontinued     125 mg Oral 4 times daily 02/20/16 0833 02/20/16 1030   02/20/16 0300  cefTRIAXone (ROCEPHIN) 1 g in dextrose 5 % 50 mL IVPB     1 g 100 mL/hr over 30 Minutes Intravenous  Once 02/20/16 0254 02/20/16 0459      DVT Prophylaxis: Lovenox  Code Status: Full code  Family Communication None at bedside  Procedures: None  CONSULTS:  None  Time spent 25 minutes-Greater than 50% of this time was spent in counseling, explanation of diagnosis, planning of further management, and coordination of care.  MEDICATIONS: Scheduled Meds: . benztropine  0.5 mg Oral BID  . cloNIDine  0.1 mg Oral BID  . DULoxetine  30 mg Oral Daily  . enoxaparin (LOVENOX) injection  40 mg Subcutaneous Q24H  . gabapentin  100 mg Oral TID  . haloperidol  2 mg Oral  BID  . mometasone-formoterol  2 puff Inhalation BID  . nicotine  21 mg Transdermal Daily  . sodium chloride flush  10-40 mL Intracatheter Q12H  . thiamine  100 mg Oral Daily   Continuous Infusions:  PRN Meds:.acetaminophen **OR** acetaminophen, albuterol, alum & mag hydroxide-simeth, ondansetron **OR** ondansetron (ZOFRAN) IV, oxyCODONE, sodium chloride flush, traZODone    PHYSICAL EXAM: Vital  signs in last 24 hours: Filed Vitals:   02/27/16 0026 02/27/16 0400 02/27/16 0815 02/27/16 0844  BP: 152/121 109/88 138/103   Pulse: 68 79    Temp: 98.5 F (36.9 C) 98.3 F (36.8 C) 98.4 F (36.9 C)   TempSrc: Oral Oral Oral   Resp: Height:      Weight:      SpO2: 100%   99%    Weight change:  Filed Weights   02/20/16 1956  Weight: 54 kg (119 lb 0.8 oz)   Body mass index is 20.42 kg/(m^2).   Gen Exam: Awake and alert-lethargic-but not in any distress. Speech slow but clear.  Neck: Supple, No JVD.   Chest: B/L Clear.   CVS: S1 S2 Regular, no murmurs.  Abdomen: soft, BS +, non tender, non distended.  Extremities: + edema, lower extremities warm to touch Neurologic:   right lower extremity 4/5, left lower extremity 3-4/5  generalized weakness as well.  Skin: No Rash.   Wounds: N/A.    Intake/Output from previous day:  Intake/Output Summary (Last 24 hours) at 02/27/16 1134 Last data filed at 02/27/16 0808  Gross per 24 hour  Intake    360 ml  Output   1050 ml  Net   -690 ml     LAB RESULTS: CBC  Recent Labs Lab 02/22/16 0328 02/25/16 0445 02/25/16 1009 02/27/16 0250  WBC 4.2 3.7*  --  4.4  HGB 8.9* 8.3*  --  9.7*  HCT 29.4* 27.1* 30.2* 30.5*  PLT 116* 144*  --  188  MCV 95.8 96.4  --  96.8  MCH 29.0 29.5  --  30.8  MCHC 30.3 30.6  --  31.8  RDW 20.8* 21.3*  --  21.7*    Chemistries   Recent Labs Lab 02/22/16 0328 02/22/16 1050 02/23/16 0545 02/25/16 0445 02/26/16 1137 02/27/16 0250  NA 141  --  141 142 140 141  K 3.3*  --  4.2 3.3* 4.0 4.1  CL 103  --  105 113* 111 107  CO2 26  --  21* 21* 22 24  GLUCOSE 89  --  84 113* 132* 109*  BUN <5*  --  7 <5* <5* <5*  CREATININE 0.60  --  0.69 0.44 0.55 0.55  CALCIUM 7.0*  --  8.3* 7.6* 8.4* 8.7*  MG  --  1.3*  --   --  1.6* 1.6*    CBG:  Recent Labs Lab 02/24/16 0333  GLUCAP 68    GFR Estimated Creatinine Clearance: 68.5 mL/min (by C-G formula based on Cr of  0.55).  Coagulation profile No results for input(s): INR, PROTIME in the last 168 hours.  Cardiac Enzymes No results for input(s): CKMB, TROPONINI, MYOGLOBIN in the last 168 hours.  Invalid input(s): CK  Invalid input(s): POCBNP No results for input(s): DDIMER in the last 72 hours. No results for input(s): HGBA1C in the last 72 hours. No results for input(s): CHOL, HDL, LDLCALC, TRIG, CHOLHDL, LDLDIRECT in the last 72 hours.  Recent Labs  02/25/16 1008  TSH 2.875  Recent Labs  02/25/16 1009  VITAMINB12 570   No results for input(s): LIPASE, AMYLASE in the last 72 hours.  Urine Studies No results for input(s): UHGB, CRYS in the last 72 hours.  Invalid input(s): UACOL, UAPR, USPG, UPH, UTP, UGL, UKET, UBIL, UNIT, UROB, ULEU, UEPI, UWBC, URBC, UBAC, CAST, UCOM, BILUA  MICROBIOLOGY: Recent Results (from the past 240 hour(s))  Urine culture     Status: None   Collection Time: 02/19/16 10:29 PM  Result Value Ref Range Status   Specimen Description URINE, RANDOM  Final   Special Requests NONE  Final   Culture >=100,000 COLONIES/mL ESCHERICHIA COLI  Final   Report Status 02/22/2016 FINAL  Final   Organism ID, Bacteria ESCHERICHIA COLI  Final      Susceptibility   Escherichia coli - MIC*    AMPICILLIN 8 SENSITIVE Sensitive     CEFAZOLIN <=4 SENSITIVE Sensitive     CEFTRIAXONE <=1 SENSITIVE Sensitive     CIPROFLOXACIN <=0.25 SENSITIVE Sensitive     GENTAMICIN <=1 SENSITIVE Sensitive     IMIPENEM <=0.25 SENSITIVE Sensitive     NITROFURANTOIN <=16 SENSITIVE Sensitive     TRIMETH/SULFA <=20 SENSITIVE Sensitive     AMPICILLIN/SULBACTAM 4 SENSITIVE Sensitive     PIP/TAZO <=4 SENSITIVE Sensitive     * >=100,000 COLONIES/mL ESCHERICHIA COLI  MRSA PCR Screening     Status: None   Collection Time: 02/20/16  6:41 PM  Result Value Ref Range Status   MRSA by PCR NEGATIVE NEGATIVE Final    Comment:        The GeneXpert MRSA Assay (FDA approved for NASAL specimens only), is  one component of a comprehensive MRSA colonization surveillance program. It is not intended to diagnose MRSA infection nor to guide or monitor treatment for MRSA infections.     RADIOLOGY STUDIES/RESULTS: Mr Lumbar Spine W Wo Contrast  02/26/2016  CLINICAL DATA:  Profound bilateral lower extremity weakness, right greater than left. EXAM: MRI LUMBAR SPINE WITHOUT AND WITH CONTRAST TECHNIQUE: Multiplanar and multiecho pulse sequences of the lumbar spine were obtained without and with intravenous contrast. CONTRAST:  10 mL MultiHance COMPARISON:  CT abdomen and pelvis 12/18/2015. FINDINGS: Normal signal is present in the conus medullaris which terminates at L1. Bilateral L5 pars defects are present. Grade 1 anterolisthesis at L5-S1 is stable, measuring 6 mm. There is chronic fatty marrow infiltration of the L5 vertebral body and as well as the inferior endplate of L4 and superior endplate of S1. Marrow signal, vertebral body heights, and alignment are otherwise normal. Limited imaging of the abdomen is unremarkable. The disc levels at L2-3 and above are normal. L3-4: A mild broad-based disc protrusion is present. There is mild facet hypertrophy bilaterally. No significant stenosis is present. L4-5: A broad-based disc protrusion is present. There is mild facet hypertrophy bilaterally. Slight retrolisthesis is noted. The disc extends into the right neural foramen without significant stenosis. L5-S1: There is uncovering of a broad-based disc protrusion associated with the anterolisthesis. In combination with facet spurring this leads to moderate to severe foraminal narrowing bilaterally, left greater than right. IMPRESSION: 1. 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 2. Uncovering of a broad-based disc protrusion and facet spurring at L5-S1 leads to moderate to severe foraminal stenosis bilaterally, left greater than right. 3. Slight retrolisthesis and a rightward disc  protrusion at L4-5 without significant stenosis. 4. Mild broad-based disc protrusion and facet hypertrophy at L3-4 without significant stenosis. Electronically Signed  By: Marin Robertshristopher  Mattern M.D.   On: 02/26/2016 19:44   Dg Abd Acute W/chest  02/19/2016  CLINICAL DATA:  55 year old female with abdominal pain EXAM: DG ABDOMEN ACUTE W/ 1V CHEST COMPARISON:  The CT dated 12/18/2015 FINDINGS: The lungs are clear. No pleural effusion or pneumothorax. The cardiac silhouette is within normal limits. There is no evidence of bowel obstruction or free air. No radiopaque calculi identified. IMPRESSION: No radiopaque foreign object. There is degenerative changes of the osseous structures. No acute fracture. Electronically Signed   By: Elgie CollardArash  Radparvar M.D.   On: 02/19/2016 23:42    Jeoffrey MassedGHIMIRE,SHANKER, MD  [facility] Pager:[phone]  If 7PM-7AM, please contact night-coverage [email or web address] Password [email or web address] 02/27/2016, 11:34 AM   LOS: 7 days

## 2016-02-28 DIAGNOSIS — R29898 Other symptoms and signs involving the musculoskeletal system: Secondary | ICD-10-CM | POA: Insufficient documentation

## 2016-02-28 LAB — BASIC METABOLIC PANEL
ANION GAP: 7 (ref 5–15)
BUN: 6 mg/dL (ref 6–20)
CO2: 27 mmol/L (ref 22–32)
Calcium: 8.6 mg/dL — ABNORMAL LOW (ref 8.9–10.3)
Chloride: 107 mmol/L (ref 101–111)
Creatinine, Ser: 0.57 mg/dL (ref 0.44–1.00)
GFR calc Af Amer: >60 mL/min (ref >60)
GLUCOSE: 95 mg/dL (ref 65–99)
POTASSIUM: 4.8 mmol/L (ref 3.5–5.1)
Sodium: 141 mmol/L (ref 135–145)

## 2016-02-28 LAB — MAGNESIUM: Magnesium: 1.7 mg/dL (ref 1.7–2.4)

## 2016-02-28 MED ORDER — GADOBENATE DIMEGLUMINE 529 MG/ML IV SOLN
10.0000 mL | Freq: Once | INTRAVENOUS | Status: AC | PRN
Start: 2016-02-26 — End: 2016-02-26
  Administered 2016-02-26: 10 mL via INTRAVENOUS

## 2016-02-28 NOTE — Progress Notes (Signed)
CSW reviewed current PT note- pt ambulating much more but still lots of concerns about safety at home/ continued hallucinations   CSW discussed with medical director- approved to send referral to [facility]- CSW attempted to contact pt fiance to ensure he was agreeable [place]- left messages at contact numbers  Currently no SNF beds available due to concerns about pt safety at facility/ mental status  CSW will continue to follow  Merlyn LotJenna Holoman, [facility] HospitalCSWA Clinical Social Worker [phone]

## 2016-02-28 NOTE — Progress Notes (Signed)
Physical Therapy Treatment Patient Details Name: Rhonda Mitchell MRN: [id number] DOB: Sep 10, 1961 Today's Date: 02/28/2016    History of Present Illness Rhonda Mitchell is a 55 y.o. female with a history of ETOH Abuse, COPD, Hep C who presents to the ED with complaints of burning Epigastric ABD Pain and SOB, and Nausea Vomiting and Diarrhea. She reports not sleeping, eating, or drinking well for the past 2 days. She drinks 3-4 pints of liquor daily and has done so for many years and her last drink was last night. Her Alcohol level was found at 298, and she presents today requesting Detox Rx. Her O2 sats were found at 85% and she was placed on 2 liters NCO2. She was also found to have a UTI and was placed on empiric IV Rocephin,and the Alcohol Withdrawal Protocol was initiated and she was referred for admission.     PT Comments    Pt extremely impulsive with poor safety awareness.  Pt reporting she wants to go to the mall this afternoon to go shopping.  Pt intermittently follows one step commands during tx.    Follow Up Recommendations  CIR (drug rehab is primary.  )     Equipment Recommendations  Rolling walker with 5" wheels    Recommendations for Other Services       Precautions / Restrictions Precautions Precautions: None Precaution Comments: watch o2 Restrictions Weight Bearing Restrictions: No    Mobility  Bed Mobility Overal bed mobility: Needs Assistance Bed Mobility: Sit to Supine       Sit to supine: Min guard   General bed mobility comments: Pt required tactile cues to transfer back to bed post gait training.  Pt sitting edge of bed on arrival attempting to get dressed to go to the mall.    Transfers Overall transfer level: Needs assistance Equipment used: 1 person hand held assist;2 person hand held assist;Rolling walker (2 wheeled) (Pt impulsively stood after removing gowns to check drawers in room for clothing.  Pt requiring +1/+2 HHA to maintain  balance and safety.  Pt performed additonal standing trials with RW.  ) Transfers: Sit to/from Stand Sit to Stand: Min guard;Min assist;Mod assist;+2 safety/equipment         General transfer comment: Assist level varried based on pt's balance during transition into standing.  Pt demonstrated poor safety with hand placement despite cues to push and reach to/from seated surface.  Pt impulsive to stand and required mod assist to correct balance.    Ambulation/Gait Ambulation/Gait assistance: Mod assist;Max assist;Min assist;+2 safety/equipment Ambulation Distance (Feet): 10 Feet (x2 trials to and from dresser in room + 650 ft with close chair follow.  ) Assistive device: Rolling walker (2 wheeled);1 person hand held assist;2 person hand held assist Gait Pattern/deviations: Step-to pattern;Wide base of support;Trunk flexed;Decreased dorsiflexion - left;Decreased dorsiflexion - right Gait velocity: increased speed with poor safety awareness.     General Gait Details: Pt presents with decreased heel strike to L LE that improved as get progressed but unable to dorsiflex in normal range.  pt required frequent cues to keep hands on RW and maintain position in RW to improve safety.  pt easily distracted by staff in halls.     Stairs            Wheelchair Mobility    Modified Rankin (Stroke Patients Only)       Balance     Sitting balance-Leahy Scale: Poor       Standing balance-Leahy Scale: Poor  Cognition Arousal/Alertness: Awake/alert Behavior During Therapy: Impulsive;Anxious Overall Cognitive Status: Impaired/Different from baseline Area of Impairment: Orientation;Attention;Memory;Safety/judgement;Awareness;Following commands;Problem solving Orientation Level: Disoriented to;Time;Situation Current Attention Level: Sustained Memory: Decreased short-term memory Following Commands: Follows one step commands with increased time;Follows one step  commands inconsistently Safety/Judgement: Decreased awareness of safety;Decreased awareness of deficits Awareness: Intellectual Problem Solving: Slow processing;Decreased initiation General Comments: "I will not wear this shit to the mall." "Give me some damn clothes."  Pt remains very impulsive and required constant assist to improve and maintain balance.      Exercises      General Comments        Pertinent Vitals/Pain Pain Assessment: No/denies pain Pain Intervention(s): Monitored during session    Home Living                      Prior Function            PT Goals (current goals can now be found in the care plan section) Acute Rehab PT Goals Patient Stated Goal: go to rehab Potential to Achieve Goals: Good Progress towards PT goals: Progressing toward goals    Frequency  Min 3X/week    PT Plan      Co-evaluation             End of Session Equipment Utilized During Treatment: Gait belt Activity Tolerance: Patient tolerated treatment well Patient left: in chair;with call bell/phone within reach;with chair alarm set     Time: 1400-1425 PT Time Calculation (min) (ACUTE ONLY): 25 min  Charges:  $Gait Training: 8-22 mins $Therapeutic Activity: 8-22 mins                    G Codes:      Rhonda Mitchell 02/28/2016, 2:55 PM  Joycelyn RuaAimee Buryl Mitchell, PTA pager [phone]

## 2016-02-28 NOTE — Progress Notes (Signed)
Pt very impulsive getting oob, sitter at bedside, pt aggitated and walked with RN and sitter, pt requested pain med and "something for my nerves" PRN meds given as ordered, pt back in bed, bed alarm on and sitter at bedside

## 2016-02-28 NOTE — Progress Notes (Signed)
Subjective: Still feels very weak in her lags and   Exam: Filed Vitals:   02/27/16 2047 02/28/16 0539  BP: 118/76 103/68  Pulse: 92 76  Temp: 98.4 F (36.9 C) 98.7 F (37.1 C)  Resp: 19 18        Gen: In bed, NAD MS:  Alert and oriented, labile and crying  CN: Visual fields grossly normal, pupils equal, round, reactive to light and accommodation, extra-ocular motions intact bilaterally, smile asymmetric but adentulous, facial light touch sensation normal bilaterally Motor: 5/5 throughout with poor effort limited by pain throughout Sensory: intact throughout DTR: 2+ throughout  Pertinent Labs: B12 570 B1 242 TSH normal   Assessment/Plan: 55 YO female with 2 week self proclaimed history of LE weakness. Exam shows give way weakness and poor effort throughout with labile demeanor. Exam is non focal other than brisk knee jerks. Possible thoracic myelopathy will need to be ruled out  Recommend: MRI thoracic spine to rule out thoracic cord lesion. Agree with PT intervention.   We will see in follow-up following thoracic spine MRI study.    Felicie MornDavid Smith PA-C [facility] [phone]  I personally participated in this patient's evaluation and management, including formulating the above clinical impression and management recommendations.  Venetia MaxonR Tere Mcconaughey M.D. [facility] [phone]  02/28/2016, 9:55 AM

## 2016-02-28 NOTE — Progress Notes (Signed)
Inpatient Rehabilitation  Await thoracic MRI results; however, given the need for Supervision that patient does not have recommend SNF if physical rehab is warranted upon discharge.  CSW aware.  Will sign off at this time.  Charlane FerrettiMelissa Tanish Prien, M.A., CCC/SLP Admission Coordinator  [facility]  Cell [phone]

## 2016-02-28 NOTE — Progress Notes (Signed)
SLP Cancellation Note  Patient Details Name: Rhonda Mitchell MRN: [id number] DOB: [birth year]   Cancelled treatment:       Reason Eval/Treat Not Completed: Patient declined participation in PO trials. She was impulsively trying to get out of bed, and nursing came to room to assist with mobility per pt request. Will f/u as able.   Maxcine HamLaura Paiewonsky, M.A. CCC-SLP [phone]  [name], Kylon Philbrook 02/28/2016, 3:51 PM

## 2016-02-28 NOTE — Clinical Social Work Note (Signed)
Clinical Social Work Assessment  Patient Details  Name: Rhonda Mitchell MRN: [id number] Date of Birth: 12-19-1961  Date of referral:  02/28/16               Reason for consult:  Substance Use/ETOH Abuse                Permission sought to share information with:  Family Supports Permission granted to share information::  Yes, Verbal Permission Granted  Name::     Teacher, adult educationJames  Agency::     Relationship::  fiance  Contact Information:     Housing/Transportation Living arrangements for the past 2 months:  Single Family Home Source of Information:  Patient Patient Interpreter Needed:  None Criminal Activity/Legal Involvement Pertinent to Current Situation/Hospitalization:  No - Comment as needed Significant Relationships:  Significant Other Lives with:  Significant Other Do you feel safe going back to the place where you live?  No Need for family participation in patient care:  Yes (Comment) (decision)  Care giving concerns:  Pt lives with her fiance, Fayrene FearingJames, who is a IT trainertrucker and unable to be at home for days at a time   Office managerocial Worker assessment / plan:  CSW spoke with pt fiance concerning plan for pt when ready for DC.  CSW explained PT recommendation for physical therapy and possibility of CIR vs SNF.  Employment status:  Unemployed Health and safety inspectornsurance information:  Self Pay (Medicaid Pending) PT Recommendations:  Skilled Nursing Facility Information / Referral to community resources:  Skilled Nursing Facility  Patient/Family's Response to care:  Pt fiance is agreeable to pt placement in whatever rehab facility is able to accept her- feels strongly that she needs to work towards goals of being independent at home.  Patient/Family's Understanding of and Emotional Response to Diagnosis, Current Treatment, and Prognosis:  Pt fiance has good understanding of pt needs but seems to be frustrated with cycle of drinking and hospitalizations that the patient has had- hopeful for pt recovery.  Emotional  Assessment Appearance:  Appears stated age Attitude/Demeanor/Rapport:  Unable to Assess Affect (typically observed):  Unable to Assess Orientation:  Oriented to Self, Oriented to Place Alcohol / Substance use:  Alcohol Use Psych involvement (Current and /or in the community):  Yes (Comment) (see inpatient for eval/medication management)  Discharge Needs  Concerns to be addressed:  Care Coordination, Home Safety Concerns, Mental Health Concerns, Substance Abuse Concerns Readmission within the last 30 days:  No Current discharge risk:  Physical Impairment, Lack of support system Barriers to Discharge:  Continued Medical Work up   Peabody EnergyHoloman, Demetrice Combes M, LCSW 02/28/2016, 12:26 PM

## 2016-02-28 NOTE — Progress Notes (Signed)
Pt found by NT crawling on floor, pt stated " I was trying to get candy out of the Easter basket" pt stated " I didn't fall I was just crawling Im sorry" VSS, MD notified, bed alarm on pt in camera on, pt stable

## 2016-02-28 NOTE — Progress Notes (Signed)
PATIENT DETAILS Name: Rhonda Mitchell Age: 55 y.o. Sex: female Date of Birth: August 06, 1961 Admit Date: 02/19/2016 Admitting Physician Ron Parker, MD PCP:No primary care provider on file.  Subjective: Much more calm and quiet on this morning. Complains of pain in her right leg area.   Assessment/Plan: Principal Problem: Alcohol withdrawal: Resolved, managed with Ativan per protocol. Now awake alert, not tremulous.  Active Problems: Generalized weakness: Most in lower extremities-right more than left. Per patient, she has known history of degenerative disc disease in her back, and 2-3 weeks prior to admission was having troubles with gait due to weakness in the lower extremities and was crawling at home. MRI of the  Lumbosacral spine discussed with neurosurgery-Dr. Yetta Barre over the phone-  he does not thinkthat any of these changes can account for patient's symptoms.TSH/vitamin B12 within normal limits. Continue PT-neurology recommending thoracic MRI, but weakness seems to be better today to get strength is much better.   COPD: Lungs clear, continue bronchodilators.  Hypokalemia/hypomagnesemia: Suspect secondary to GI loss and alcohol.    Anemia:  Secondary to chronic disease-no evidence of blood loss. Follow.  Thrombocytopenia: Suspect secondary to alcohol use, improving.  Escherichia coli UTI: Completed course of antibiotics.  History of chronic hepatitis C: Stable for outpatient follow-up.  History of depression/alcohol induced mood disorder: Hospital course has been complicated by alcohol withdrawal delirium, however as been noted to be depressed and crying. Psychiatry consulted , plans are to continue with  Haldol, Cymbalta, Neurontin  Disposition: Remain inpatient- CIR or SNF when bed available  Antimicrobial agents  See below  Anti-infectives    Start     Dose/Rate Route Frequency Ordered Stop   02/22/16 1800  sulfamethoxazole-trimethoprim (BACTRIM  DS,SEPTRA DS) 800-160 MG per tablet 1 tablet  Status:  Discontinued     1 tablet Oral Every 12 hours 02/22/16 1000 02/24/16 0944   02/21/16 0300  cefTRIAXone (ROCEPHIN) 1 g in dextrose 5 % 50 mL IVPB  Status:  Discontinued     1 g 100 mL/hr over 30 Minutes Intravenous Every 24 hours 02/20/16 0436 02/22/16 0959   02/20/16 1030  metroNIDAZOLE (FLAGYL) IVPB 500 mg  Status:  Discontinued     500 mg 100 mL/hr over 60 Minutes Intravenous Every 8 hours 02/20/16 1030 02/21/16 1146   02/20/16 1000  vancomycin (VANCOCIN) 50 mg/mL oral solution 125 mg  Status:  Discontinued     125 mg Oral 4 times daily 02/20/16 0833 02/20/16 1030   02/20/16 0300  cefTRIAXone (ROCEPHIN) 1 g in dextrose 5 % 50 mL IVPB     1 g 100 mL/hr over 30 Minutes Intravenous  Once 02/20/16 0254 02/20/16 0459      DVT Prophylaxis: Lovenox  Code Status: Full code  Family Communication None at bedside  Procedures: None  CONSULTS:  Neurology  Time spent 25 minutes-Greater than 50% of this time was spent in counseling, explanation of diagnosis, planning of further management, and coordination of care.  MEDICATIONS: Scheduled Meds: . benztropine  0.5 mg Oral BID  . cloNIDine  0.1 mg Oral BID  . DULoxetine  30 mg Oral Daily  . enoxaparin (LOVENOX) injection  40 mg Subcutaneous Q24H  . gabapentin  100 mg Oral TID  . haloperidol  2 mg Oral BID  . mometasone-formoterol  2 puff Inhalation BID  . nicotine  21 mg Transdermal Daily  . sodium chloride flush  10-40 mL Intracatheter  Q12H   Continuous Infusions:  PRN Meds:.acetaminophen **OR** acetaminophen, albuterol, alum & mag hydroxide-simeth, haloperidol lactate, LORazepam, ondansetron **OR** ondansetron (ZOFRAN) IV, oxyCODONE, sodium chloride flush, traZODone    PHYSICAL EXAM: Vital signs in last 24 hours: Filed Vitals:   02/27/16 1148 02/27/16 1719 02/27/16 2047 02/28/16 0539  BP: 112/86 147/95 118/76 103/68  Pulse:  68 92 76  Temp: 98.4 F (36.9 C) 98.6 F  (37 C) 98.4 F (36.9 C) 98.7 F (37.1 C)  TempSrc: Oral Oral Oral Oral  Resp: 26 22 19 18   Height:      Weight:      SpO2:  97% 97% 96%    Weight change:  Filed Weights   02/20/16 1956  Weight: 54 kg (119 lb 0.8 oz)   Body mass index is 20.42 kg/(m^2).   Gen Exam: Awake and alert-anxious this morning Neck: Supple, No JVD.   Chest: B/L Clear.   CVS: S1 S2 Regular, no murmurs.  Abdomen: soft, BS +, non tender, non distended.  Extremities: No edema, lower extremities warm to touch Neurologic:   right lower extremity 4/5, left lower extremity 4/5  generalized weakness as well.  Skin: No Rash.   Wounds: N/A.    Intake/Output from previous day:  Intake/Output Summary (Last 24 hours) at 02/28/16 1010 Last data filed at 02/28/16 0801  Gross per 24 hour  Intake    200 ml  Output   1450 ml  Net  -1250 ml     LAB RESULTS: CBC  Recent Labs Lab 02/22/16 0328 02/25/16 0445 02/25/16 1009 02/27/16 0250  WBC 4.2 3.7*  --  4.4  HGB 8.9* 8.3*  --  9.7*  HCT 29.4* 27.1* 30.2* 30.5*  PLT 116* 144*  --  188  MCV 95.8 96.4  --  96.8  MCH 29.0 29.5  --  30.8  MCHC 30.3 30.6  --  31.8  RDW 20.8* 21.3*  --  21.7*    Chemistries   Recent Labs Lab 02/22/16 1050 02/23/16 0545 02/25/16 0445 02/26/16 1137 02/27/16 0250 02/28/16 0522  NA  --  141 142 140 141 141  K  --  4.2 3.3* 4.0 4.1 4.8  CL  --  105 113* 111 107 107  CO2  --  21* 21* 22 24 27   GLUCOSE  --  84 113* 132* 109* 95  BUN  --  7 <5* <5* <5* 6  CREATININE  --  0.69 0.44 0.55 0.55 0.57  CALCIUM  --  8.3* 7.6* 8.4* 8.7* 8.6*  MG 1.3*  --   --  1.6* 1.6* 1.7    CBG:  Recent Labs Lab 02/24/16 0333  GLUCAP 68    GFR Estimated Creatinine Clearance: 68.5 mL/min (by C-G formula based on Cr of 0.57).  Coagulation profile No results for input(s): INR, PROTIME in the last 168 hours.  Cardiac Enzymes No results for input(s): CKMB, TROPONINI, MYOGLOBIN in the last 168 hours.  Invalid input(s):  CK  Invalid input(s): POCBNP No results for input(s): DDIMER in the last 72 hours. No results for input(s): HGBA1C in the last 72 hours. No results for input(s): CHOL, HDL, LDLCALC, TRIG, CHOLHDL, LDLDIRECT in the last 72 hours. No results for input(s): TSH, T4TOTAL, T3FREE, THYROIDAB in the last 72 hours.  Invalid input(s): FREET3 No results for input(s): VITAMINB12, FOLATE, FERRITIN, TIBC, IRON, RETICCTPCT in the last 72 hours. No results for input(s): LIPASE, AMYLASE in the last 72 hours.  Urine Studies No results for input(s):  UHGB, CRYS in the last 72 hours.  Invalid input(s): UACOL, UAPR, USPG, UPH, UTP, UGL, UKET, UBIL, UNIT, UROB, ULEU, UEPI, UWBC, URBC, UBAC, CAST, UCOM, BILUA  MICROBIOLOGY: Recent Results (from the past 240 hour(s))  Urine culture     Status: None   Collection Time: 02/19/16 10:29 PM  Result Value Ref Range Status   Specimen Description URINE, RANDOM  Final   Special Requests NONE  Final   Culture >=100,000 COLONIES/mL ESCHERICHIA COLI  Final   Report Status 02/22/2016 FINAL  Final   Organism ID, Bacteria ESCHERICHIA COLI  Final      Susceptibility   Escherichia coli - MIC*    AMPICILLIN 8 SENSITIVE Sensitive     CEFAZOLIN <=4 SENSITIVE Sensitive     CEFTRIAXONE <=1 SENSITIVE Sensitive     CIPROFLOXACIN <=0.25 SENSITIVE Sensitive     GENTAMICIN <=1 SENSITIVE Sensitive     IMIPENEM <=0.25 SENSITIVE Sensitive     NITROFURANTOIN <=16 SENSITIVE Sensitive     TRIMETH/SULFA <=20 SENSITIVE Sensitive     AMPICILLIN/SULBACTAM 4 SENSITIVE Sensitive     PIP/TAZO <=4 SENSITIVE Sensitive     * >=100,000 COLONIES/mL ESCHERICHIA COLI  MRSA PCR Screening     Status: None   Collection Time: 02/20/16  6:41 PM  Result Value Ref Range Status   MRSA by PCR NEGATIVE NEGATIVE Final    Comment:        The GeneXpert MRSA Assay (FDA approved for NASAL specimens only), is one component of a comprehensive MRSA colonization surveillance program. It is not intended to  diagnose MRSA infection nor to guide or monitor treatment for MRSA infections.     RADIOLOGY STUDIES/RESULTS: Mr Lumbar Spine W Wo Contrast  02/26/2016  CLINICAL DATA:  Profound bilateral lower extremity weakness, right greater than left. EXAM: MRI LUMBAR SPINE WITHOUT AND WITH CONTRAST TECHNIQUE: Multiplanar and multiecho pulse sequences of the lumbar spine were obtained without and with intravenous contrast. CONTRAST:  10 mL MultiHance COMPARISON:  CT abdomen and pelvis 12/18/2015. FINDINGS: Normal signal is present in the conus medullaris which terminates at L1. Bilateral L5 pars defects are present. Grade 1 anterolisthesis at L5-S1 is stable, measuring 6 mm. There is chronic fatty marrow infiltration of the L5 vertebral body and as well as the inferior endplate of L4 and superior endplate of S1. Marrow signal, vertebral body heights, and alignment are otherwise normal. Limited imaging of the abdomen is unremarkable. The disc levels at L2-3 and above are normal. L3-4: A mild broad-based disc protrusion is present. There is mild facet hypertrophy bilaterally. No significant stenosis is present. L4-5: A broad-based disc protrusion is present. There is mild facet hypertrophy bilaterally. Slight retrolisthesis is noted. The disc extends into the right neural foramen without significant stenosis. L5-S1: There is uncovering of a broad-based disc protrusion associated with the anterolisthesis. In combination with facet spurring this leads to moderate to severe foraminal narrowing bilaterally, left greater than right. IMPRESSION: 1. 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 2. Uncovering of a broad-based disc protrusion and facet spurring at L5-S1 leads to moderate to severe foraminal stenosis bilaterally, left greater than right. 3. Slight retrolisthesis and a rightward disc protrusion at L4-5 without significant stenosis. 4. Mild broad-based disc protrusion and facet  hypertrophy at L3-4 without significant stenosis. Electronically Signed   By: Marin Roberts M.D.   On: 02/26/2016 19:44   Dg Abd Acute W/chest  02/19/2016  CLINICAL DATA:  55 year old female with abdominal pain EXAM:  DG ABDOMEN ACUTE W/ 1V CHEST COMPARISON:  The CT dated 12/18/2015 FINDINGS: The lungs are clear. No pleural effusion or pneumothorax. The cardiac silhouette is within normal limits. There is no evidence of bowel obstruction or free air. No radiopaque calculi identified. IMPRESSION: No radiopaque foreign object. There is degenerative changes of the osseous structures. No acute fracture. Electronically Signed   By: Elgie Collard M.D.   On: 02/19/2016 23:42    Jeoffrey Massed, MD  [facility] Pager:[phone]  If 7PM-7AM, please contact night-coverage [email or web address] Password [email or web address] 02/28/2016, 10:10 AM   LOS: 8 days

## 2016-02-29 NOTE — Progress Notes (Signed)
CSW continuing to follow for placement  No bed offers at this time  Per MD note awaiting MRI  Pt will also need to be safe without sitter for 24 hours before appropriate for placement  Merlyn LotJenna Holoman, [facility] Clinical Social Worker [phone]

## 2016-02-29 NOTE — Progress Notes (Signed)
PATIENT DETAILS Name: Rhonda GillesMichelle Gledhill Age: 55 y.o. Sex: female Date of Birth: [birth year] Admit Date: 02/19/2016 Admitting Physician Ron ParkerHarvette C Jenkins, MD PCP:No primary care provider on file.  Subjective: Awake alert-very anxious and tearful this morning..   Assessment/Plan: Principal Problem: Alcohol withdrawal: Resolved, managed with Ativan per protocol. Now awake alert, not tremulous.  Active Problems: Generalized weakness: Most in lower extremities-right more than left. Per patient, she has known history of degenerative disc disease in her back, and 2-3 weeks prior to admission was having troubles with gait due to weakness in the lower extremities and was crawling at home. MRI of the  Lumbosacral spine discussed with neurosurgery-Dr. Yetta BarreJones over the phone-  he does not thinkthat any of these changes can account for patient's symptoms.TSH/vitamin B12 within normal limits. Continue PT-neurology recommending thoracic MRI, but weakness seems to be better today.   COPD: Lungs clear, continue bronchodilators.  Hypokalemia/hypomagnesemia: Suspect secondary to GI loss and alcohol.    Anemia:  Secondary to chronic disease-no evidence of blood loss. Follow.  Thrombocytopenia: Suspect secondary to alcohol use, improving.  Escherichia coli UTI: Completed course of antibiotics.  History of chronic hepatitis C: Stable for outpatient follow-up.  History of depression/alcohol induced mood disorder:  anxious, very tearful today-will reconsult psychiatry for follow-up. Continue with Haldol, Cymbalta, Neurontin  Disposition: Remain inpatient- SNF when bed available-once thoracic MRI is completed.   Antimicrobial agents  See below  Anti-infectives    Start     Dose/Rate Route Frequency Ordered Stop   02/22/16 1800  sulfamethoxazole-trimethoprim (BACTRIM DS,SEPTRA DS) 800-160 MG per tablet 1 tablet  Status:  Discontinued     1 tablet Oral Every 12 hours 02/22/16 1000  02/24/16 0944   02/21/16 0300  cefTRIAXone (ROCEPHIN) 1 g in dextrose 5 % 50 mL IVPB  Status:  Discontinued     1 g 100 mL/hr over 30 Minutes Intravenous Every 24 hours 02/20/16 0436 02/22/16 0959   02/20/16 1030  metroNIDAZOLE (FLAGYL) IVPB 500 mg  Status:  Discontinued     500 mg 100 mL/hr over 60 Minutes Intravenous Every 8 hours 02/20/16 1030 02/21/16 1146   02/20/16 1000  vancomycin (VANCOCIN) 50 mg/mL oral solution 125 mg  Status:  Discontinued     125 mg Oral 4 times daily 02/20/16 0833 02/20/16 1030   02/20/16 0300  cefTRIAXone (ROCEPHIN) 1 g in dextrose 5 % 50 mL IVPB     1 g 100 mL/hr over 30 Minutes Intravenous  Once 02/20/16 0254 02/20/16 0459      DVT Prophylaxis: Lovenox  Code Status: Full code  Family Communication None at bedside  Procedures: None  CONSULTS:  Neurology  Time spent 25 minutes-Greater than 50% of this time was spent in counseling, explanation of diagnosis, planning of further management, and coordination of care.  MEDICATIONS: Scheduled Meds: . benztropine  0.5 mg Oral BID  . cloNIDine  0.1 mg Oral BID  . DULoxetine  30 mg Oral Daily  . enoxaparin (LOVENOX) injection  40 mg Subcutaneous Q24H  . gabapentin  100 mg Oral TID  . haloperidol  2 mg Oral BID  . mometasone-formoterol  2 puff Inhalation BID  . nicotine  21 mg Transdermal Daily  . sodium chloride flush  10-40 mL Intracatheter Q12H   Continuous Infusions:  PRN Meds:.acetaminophen **OR** acetaminophen, albuterol, alum & mag hydroxide-simeth, haloperidol lactate, LORazepam, ondansetron **OR** ondansetron (ZOFRAN) IV, oxyCODONE, sodium chloride flush, traZODone  PHYSICAL EXAM: Vital signs in last 24 hours: Filed Vitals:   02/28/16 1500 02/28/16 2135 02/29/16 0510 02/29/16 1406  BP: 104/68  108/66 106/68  Pulse: 78  77 78  Temp: 98.2 F (36.8 C)  98.1 F (36.7 C) 98.3 F (36.8 C)  TempSrc: Oral  Oral Oral  Resp: Height:      Weight:      SpO2: 98% 98% 97%  97%    Weight change:  Filed Weights   02/20/16 1956  Weight: 54 kg (119 lb 0.8 oz)   Body mass index is 20.42 kg/(m^2).   Gen Exam: Awake and alert-anxious this morning Neck: Supple, No JVD.   Chest: B/L Clear.   CVS: S1 S2 Regular, no murmurs.  Abdomen: soft, BS +, non tender, non distended.  Extremities: No edema, lower extremities warm to touch Neurologic:   lower extremity weakness 4/5.   generalized weakness as well.  Skin: No Rash.   Wounds: N/A.    Intake/Output from previous day:  Intake/Output Summary (Last 24 hours) at 02/29/16 1438 Last data filed at 02/29/16 1107  Gross per 24 hour  Intake     10 ml  Output    900 ml  Net   -890 ml     LAB RESULTS: CBC  Recent Labs Lab 02/25/16 0445 02/25/16 1009 02/27/16 0250  WBC 3.7*  --  4.4  HGB 8.3*  --  9.7*  HCT 27.1* 30.2* 30.5*  PLT 144*  --  188  MCV 96.4  --  96.8  MCH 29.5  --  30.8  MCHC 30.6  --  31.8  RDW 21.3*  --  21.7*    Chemistries   Recent Labs Lab 02/23/16 0545 02/25/16 0445 02/26/16 1137 02/27/16 0250 02/28/16 0522  NA 141 142 140 141 141  K 4.2 3.3* 4.0 4.1 4.8  CL 105 113* 111 107 107  CO2 21* 21* GLUCOSE 84 113* 132* 109* 95  BUN 7 <5* <5* <5* 6  CREATININE 0.69 0.44 0.55 0.55 0.57  CALCIUM 8.3* 7.6* 8.4* 8.7* 8.6*  MG  --   --  1.6* 1.6* 1.7    CBG:  Recent Labs Lab 02/24/16 0333  GLUCAP 68    GFR Estimated Creatinine Clearance: 68.5 mL/min (by C-G formula based on Cr of 0.57).  Coagulation profile No results for input(s): INR, PROTIME in the last 168 hours.  Cardiac Enzymes No results for input(s): CKMB, TROPONINI, MYOGLOBIN in the last 168 hours.  Invalid input(s): CK  Invalid input(s): POCBNP No results for input(s): DDIMER in the last 72 hours. No results for input(s): HGBA1C in the last 72 hours. No results for input(s): CHOL, HDL, LDLCALC, TRIG, CHOLHDL, LDLDIRECT in the last 72 hours. No results for input(s): TSH, T4TOTAL, T3FREE,  THYROIDAB in the last 72 hours.  Invalid input(s): FREET3 No results for input(s): VITAMINB12, FOLATE, FERRITIN, TIBC, IRON, RETICCTPCT in the last 72 hours. No results for input(s): LIPASE, AMYLASE in the last 72 hours.  Urine Studies No results for input(s): UHGB, CRYS in the last 72 hours.  Invalid input(s): UACOL, UAPR, USPG, UPH, UTP, UGL, UKET, UBIL, UNIT, UROB, ULEU, UEPI, UWBC, URBC, UBAC, CAST, UCOM, BILUA  MICROBIOLOGY: Recent Results (from the past 240 hour(s))  Urine culture     Status: None   Collection Time: 02/19/16 10:29 PM  Result Value Ref Range Status   Specimen Description URINE, RANDOM  Final   Special Requests  NONE  Final   Culture >=100,000 COLONIES/mL ESCHERICHIA COLI  Final   Report Status 02/22/2016 FINAL  Final   Organism ID, Bacteria ESCHERICHIA COLI  Final      Susceptibility   Escherichia coli - MIC*    AMPICILLIN 8 SENSITIVE Sensitive     CEFAZOLIN <=4 SENSITIVE Sensitive     CEFTRIAXONE <=1 SENSITIVE Sensitive     CIPROFLOXACIN <=0.25 SENSITIVE Sensitive     GENTAMICIN <=1 SENSITIVE Sensitive     IMIPENEM <=0.25 SENSITIVE Sensitive     NITROFURANTOIN <=16 SENSITIVE Sensitive     TRIMETH/SULFA <=20 SENSITIVE Sensitive     AMPICILLIN/SULBACTAM 4 SENSITIVE Sensitive     PIP/TAZO <=4 SENSITIVE Sensitive     * >=100,000 COLONIES/mL ESCHERICHIA COLI  MRSA PCR Screening     Status: None   Collection Time: 02/20/16  6:41 PM  Result Value Ref Range Status   MRSA by PCR NEGATIVE NEGATIVE Final    Comment:        The GeneXpert MRSA Assay (FDA approved for NASAL specimens only), is one component of a comprehensive MRSA colonization surveillance program. It is not intended to diagnose MRSA infection nor to guide or monitor treatment for MRSA infections.     RADIOLOGY STUDIES/RESULTS: Mr Lumbar Spine W Wo Contrast  02/26/2016  CLINICAL DATA:  Profound bilateral lower extremity weakness, right greater than left. EXAM: MRI LUMBAR SPINE WITHOUT AND  WITH CONTRAST TECHNIQUE: Multiplanar and multiecho pulse sequences of the lumbar spine were obtained without and with intravenous contrast. CONTRAST:  10 mL MultiHance COMPARISON:  CT abdomen and pelvis 12/18/2015. FINDINGS: Normal signal is present in the conus medullaris which terminates at L1. Bilateral L5 pars defects are present. Grade 1 anterolisthesis at L5-S1 is stable, measuring 6 mm. There is chronic fatty marrow infiltration of the L5 vertebral body and as well as the inferior endplate of L4 and superior endplate of S1. Marrow signal, vertebral body heights, and alignment are otherwise normal. Limited imaging of the abdomen is unremarkable. The disc levels at L2-3 and above are normal. L3-4: A mild broad-based disc protrusion is present. There is mild facet hypertrophy bilaterally. No significant stenosis is present. L4-5: A broad-based disc protrusion is present. There is mild facet hypertrophy bilaterally. Slight retrolisthesis is noted. The disc extends into the right neural foramen without significant stenosis. L5-S1: There is uncovering of a broad-based disc protrusion associated with the anterolisthesis. In combination with facet spurring this leads to moderate to severe foraminal narrowing bilaterally, left greater than right. IMPRESSION: 1. 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 2. Uncovering of a broad-based disc protrusion and facet spurring at L5-S1 leads to moderate to severe foraminal stenosis bilaterally, left greater than right. 3. Slight retrolisthesis and a rightward disc protrusion at L4-5 without significant stenosis. 4. Mild broad-based disc protrusion and facet hypertrophy at L3-4 without significant stenosis. Electronically Signed   By: Marin Roberts M.D.   On: 02/26/2016 19:44   Dg Abd Acute W/chest  02/19/2016  CLINICAL DATA:  55 year old female with abdominal pain EXAM: DG ABDOMEN ACUTE W/ 1V CHEST COMPARISON:  The CT dated 12/18/2015  FINDINGS: The lungs are clear. No pleural effusion or pneumothorax. The cardiac silhouette is within normal limits. There is no evidence of bowel obstruction or free air. No radiopaque calculi identified. IMPRESSION: No radiopaque foreign object. There is degenerative changes of the osseous structures. No acute fracture. Electronically Signed   By: Elgie Collard M.D.   On:  02/19/2016 23:42    Jeoffrey Massed, MD  [facility] Pager:[phone]  If 7PM-7AM, please contact night-coverage [email or web address] Password [email or web address] 02/29/2016, 2:38 PM   LOS: 9 days

## 2016-03-01 ENCOUNTER — Inpatient Hospital Stay (HOSPITAL_COMMUNITY): Payer: Medicaid Other

## 2016-03-01 DIAGNOSIS — F10230 Alcohol dependence with withdrawal, uncomplicated: Secondary | ICD-10-CM

## 2016-03-01 DIAGNOSIS — F329 Major depressive disorder, single episode, unspecified: Secondary | ICD-10-CM

## 2016-03-01 MED ORDER — LORAZEPAM 2 MG/ML IJ SOLN
1.0000 mg | Freq: Once | INTRAMUSCULAR | Status: AC | PRN
  Administered 2016-03-01: 2 mg via INTRAVENOUS
  Filled 2016-03-01: qty 1

## 2016-03-01 NOTE — Progress Notes (Signed)
PATIENT DETAILS Name: Rhonda Mitchell Age: 55 y.o. Sex: female Date of Birth: [birth year] Admit Date: 02/19/2016 Admitting Physician Ron Parker, MD PCP:No primary care provider on file.  Subjective: Awake alert-very impulsive-tearful at times because she cannot walk.  Assessment/Plan: Principal Problem: Alcohol withdrawal: Resolved, managed with Ativan per protocol. Now awake alert, not tremulous.  Active Problems: Lower extremity weakness: Exam seems to be inconsistent at times-but does appear to have mild weakness.  MRI of LS discussed with neurosurgery-Dr. Yetta Barre over the phone-he does not thinkthat any of these changes can account for patient's symptoms.TSH/vitamin B12 within normal limits. Neurology subsequently consulted, await thoracic MRI. Continue PT evaluation. Per patient, she is still unable to ambulate, claims that she was at times crawling to get around 2-3 weeks prior to this admission. Will require SNF on discharge.  COPD: Lungs clear, continue bronchodilators.  Hypokalemia/hypomagnesemia: Suspect secondary to GI loss and alcohol.    Anemia:  Secondary to chronic disease-no evidence of blood loss. Follow.  Thrombocytopenia: Suspect secondary to alcohol use, improving.  Escherichia coli UTI: Completed course of antibiotics.  History of chronic hepatitis C: Stable for outpatient follow-up.  History of depression/alcohol induced mood disorder:  anxious, very tearful today-Continue with Haldol, Cymbalta, Neurontin. Psychiatry consulted during this hospital stay  Disposition: Remain inpatient- SNF when bed available-once thoracic MRI is completed.   Antimicrobial agents  See below  Anti-infectives    Start     Dose/Rate Route Frequency Ordered Stop   02/22/16 1800  sulfamethoxazole-trimethoprim (BACTRIM DS,SEPTRA DS) 800-160 MG per tablet 1 tablet  Status:  Discontinued     1 tablet Oral Every 12 hours 02/22/16 1000 02/24/16 0944   02/21/16 0300  cefTRIAXone (ROCEPHIN) 1 g in dextrose 5 % 50 mL IVPB  Status:  Discontinued     1 g 100 mL/hr over 30 Minutes Intravenous Every 24 hours 02/20/16 0436 02/22/16 0959   02/20/16 1030  metroNIDAZOLE (FLAGYL) IVPB 500 mg  Status:  Discontinued     500 mg 100 mL/hr over 60 Minutes Intravenous Every 8 hours 02/20/16 1030 02/21/16 1146   02/20/16 1000  vancomycin (VANCOCIN) 50 mg/mL oral solution 125 mg  Status:  Discontinued     125 mg Oral 4 times daily 02/20/16 0833 02/20/16 1030   02/20/16 0300  cefTRIAXone (ROCEPHIN) 1 g in dextrose 5 % 50 mL IVPB     1 g 100 mL/hr over 30 Minutes Intravenous  Once 02/20/16 0254 02/20/16 0459      DVT Prophylaxis: Lovenox  Code Status: Full code  Family Communication None at bedside  Procedures: None  CONSULTS:  Neurology  Time spent 25 minutes-Greater than 50% of this time was spent in counseling, explanation of diagnosis, planning of further management, and coordination of care.  MEDICATIONS: Scheduled Meds: . benztropine  0.5 mg Oral BID  . cloNIDine  0.1 mg Oral BID  . DULoxetine  30 mg Oral Daily  . enoxaparin (LOVENOX) injection  40 mg Subcutaneous Q24H  . gabapentin  100 mg Oral TID  . haloperidol  2 mg Oral BID  . mometasone-formoterol  2 puff Inhalation BID  . nicotine  21 mg Transdermal Daily  . sodium chloride flush  10-40 mL Intracatheter Q12H   Continuous Infusions:  PRN Meds:.acetaminophen **OR** acetaminophen, albuterol, alum & mag hydroxide-simeth, haloperidol lactate, LORazepam, LORazepam, ondansetron **OR** ondansetron (ZOFRAN) IV, oxyCODONE, sodium chloride flush, traZODone    PHYSICAL EXAM: Vital  signs in last 24 hours: Filed Vitals:   02/29/16 1406 03/01/16 0450 03/01/16 0857 03/01/16 0906  BP: 106/68 91/63  90/46  Pulse: 78 71    Temp: 98.3 F (36.8 C) 98.4 F (36.9 C)    TempSrc: Oral Oral    Resp: 18 18    Height:      Weight:      SpO2: 97% 94% 93%     Weight change:  Filed  Weights   02/20/16 1956  Weight: 54 kg (119 lb 0.8 oz)   Body mass index is 20.42 kg/(m^2).   Gen Exam: Awake and alert-anxious this morning Neck: Supple, No JVD.   Chest: B/L Clear.   CVS: S1 S2 Regular, no murmurs.  Abdomen: soft, BS +, non tender, non distended.  Extremities: No edema, lower extremities warm to touch Neurologic:   lower extremity weakness 4/5.   generalized weakness as well.  Skin: No Rash.   Wounds: N/A.    Intake/Output from previous day:  Intake/Output Summary (Last 24 hours) at 03/01/16 1200 Last data filed at 03/01/16 1051  Gross per 24 hour  Intake    860 ml  Output   2200 ml  Net  -1340 ml     LAB RESULTS: CBC  Recent Labs Lab 02/25/16 0445 02/25/16 1009 02/27/16 0250  WBC 3.7*  --  4.4  HGB 8.3*  --  9.7*  HCT 27.1* 30.2* 30.5*  PLT 144*  --  188  MCV 96.4  --  96.8  MCH 29.5  --  30.8  MCHC 30.6  --  31.8  RDW 21.3*  --  21.7*    Chemistries   Recent Labs Lab 02/25/16 0445 02/26/16 1137 02/27/16 0250 02/28/16 0522  NA 142 140 141 141  K 3.3* 4.0 4.1 4.8  CL 113* 111 107 107  CO2 21* 22 24 27   GLUCOSE 113* 132* 109* 95  BUN <5* <5* <5* 6  CREATININE 0.44 0.55 0.55 0.57  CALCIUM 7.6* 8.4* 8.7* 8.6*  MG  --  1.6* 1.6* 1.7    CBG:  Recent Labs Lab 02/24/16 0333  GLUCAP 68    GFR Estimated Creatinine Clearance: 68.5 mL/min (by C-G formula based on Cr of 0.57).  Coagulation profile No results for input(s): INR, PROTIME in the last 168 hours.  Cardiac Enzymes No results for input(s): CKMB, TROPONINI, MYOGLOBIN in the last 168 hours.  Invalid input(s): CK  Invalid input(s): POCBNP No results for input(s): DDIMER in the last 72 hours. No results for input(s): HGBA1C in the last 72 hours. No results for input(s): CHOL, HDL, LDLCALC, TRIG, CHOLHDL, LDLDIRECT in the last 72 hours. No results for input(s): TSH, T4TOTAL, T3FREE, THYROIDAB in the last 72 hours.  Invalid input(s): FREET3 No results for input(s):  VITAMINB12, FOLATE, FERRITIN, TIBC, IRON, RETICCTPCT in the last 72 hours. No results for input(s): LIPASE, AMYLASE in the last 72 hours.  Urine Studies No results for input(s): UHGB, CRYS in the last 72 hours.  Invalid input(s): UACOL, UAPR, USPG, UPH, UTP, UGL, UKET, UBIL, UNIT, UROB, ULEU, UEPI, UWBC, URBC, UBAC, CAST, UCOM, BILUA  MICROBIOLOGY: Recent Results (from the past 240 hour(s))  MRSA PCR Screening     Status: None   Collection Time: 02/20/16  6:41 PM  Result Value Ref Range Status   MRSA by PCR NEGATIVE NEGATIVE Final    Comment:        The GeneXpert MRSA Assay (FDA approved for NASAL specimens only), is one component  of a comprehensive MRSA colonization surveillance program. It is not intended to diagnose MRSA infection nor to guide or monitor treatment for MRSA infections.     RADIOLOGY STUDIES/RESULTS: Mr Lumbar Spine W Wo Contrast  02/26/2016  CLINICAL DATA:  Profound bilateral lower extremity weakness, right greater than left. EXAM: MRI LUMBAR SPINE WITHOUT AND WITH CONTRAST TECHNIQUE: Multiplanar and multiecho pulse sequences of the lumbar spine were obtained without and with intravenous contrast. CONTRAST:  10 mL MultiHance COMPARISON:  CT abdomen and pelvis 12/18/2015. FINDINGS: Normal signal is present in the conus medullaris which terminates at L1. Bilateral L5 pars defects are present. Grade 1 anterolisthesis at L5-S1 is stable, measuring 6 mm. There is chronic fatty marrow infiltration of the L5 vertebral body and as well as the inferior endplate of L4 and superior endplate of S1. Marrow signal, vertebral body heights, and alignment are otherwise normal. Limited imaging of the abdomen is unremarkable. The disc levels at L2-3 and above are normal. L3-4: A mild broad-based disc protrusion is present. There is mild facet hypertrophy bilaterally. No significant stenosis is present. L4-5: A broad-based disc protrusion is present. There is mild facet hypertrophy  bilaterally. Slight retrolisthesis is noted. The disc extends into the right neural foramen without significant stenosis. L5-S1: There is uncovering of a broad-based disc protrusion associated with the anterolisthesis. In combination with facet spurring this leads to moderate to severe foraminal narrowing bilaterally, left greater than right. IMPRESSION: 1. 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 2. Uncovering of a broad-based disc protrusion and facet spurring at L5-S1 leads to moderate to severe foraminal stenosis bilaterally, left greater than right. 3. Slight retrolisthesis and a rightward disc protrusion at L4-5 without significant stenosis. 4. Mild broad-based disc protrusion and facet hypertrophy at L3-4 without significant stenosis. Electronically Signed   By: Marin Roberts M.D.   On: 02/26/2016 19:44   Dg Abd Acute W/chest  02/19/2016  CLINICAL DATA:  55 year old female with abdominal pain EXAM: DG ABDOMEN ACUTE W/ 1V CHEST COMPARISON:  The CT dated 12/18/2015 FINDINGS: The lungs are clear. No pleural effusion or pneumothorax. The cardiac silhouette is within normal limits. There is no evidence of bowel obstruction or free air. No radiopaque calculi identified. IMPRESSION: No radiopaque foreign object. There is degenerative changes of the osseous structures. No acute fracture. Electronically Signed   By: Elgie Collard M.D.   On: 02/19/2016 23:42    Jeoffrey Massed, MD  [facility] Pager:[phone]  If 7PM-7AM, please contact night-coverage [email or web address] Password [email or web address] 03/01/2016, 12:00 PM   LOS: 10 days

## 2016-03-01 NOTE — Consult Note (Signed)
[facility] Face-to-Face Psychiatry Consult   Reason for Consult:  Anxiety and depression. Referring Physician:  Dr. Jerral Ralph Patient Identification: Rhonda Mitchell MRN:  [id number] Principal Diagnosis: Alcohol withdrawal [facility]) Diagnosis:   Patient Active Problem List   Diagnosis Date Noted  . Lower extremity weakness [R29.898]   . Pressure ulcer [L89.90] 02/26/2016  . Tachypnea [R06.82]   . Depression [F32.9]   . Hypernatremia [E87.0] 02/20/2016  . Anemia [D64.9] 02/20/2016  . UTI (lower urinary tract infection) [N39.0]   . Abdominal pain, vomiting, and diarrhea [R10.9, R11.10, R19.7] 12/18/2015  . Alcohol abuse [F10.10] 12/18/2015  . Abdominal pain [R10.9] 12/18/2015  . Elevated blood pressure [R03.0] 12/18/2015  . Severe recurrent major depression without psychotic features (HCC) [F33.2] 10/28/2015  . Alcohol dependence with uncomplicated withdrawal (HCC) [F10.230] 10/27/2015  . Alcohol-induced mood disorder (HCC) [F10.94] 10/27/2015  . Psychosis [F29] 08/21/2015  . Hypomagnesemia [E83.42]   . Protein-calorie malnutrition (HCC) [E46]   . Hepatitis C antibody test positive [R89.4]   . Elevated LFTs [R79.89]   . Hyperkalemia [E87.5]   . Severe protein-calorie malnutrition (HCC) [E43]   . Acute respiratory failure with hypoxia (HCC) [J96.01] 08/03/2015  . Septic olecranon bursitis of right elbow [M70.21, B96.89] 05/08/2015  . Septic bursitis of elbow [M71.129] 05/07/2015  . Thrombocytopenia (HCC) [D69.6] 05/07/2015  . Chronic hepatitis C without hepatic coma (HCC) [B18.2] 05/07/2015  . Hypokalemia [E87.6] 05/07/2015  . Abscess of bursa of elbow [M71.029] 05/07/2015  . COPD exacerbation (HCC) [J44.1] 03/18/2015  . Hyponatremia [E87.1] 03/18/2015  . Hyperglycemia [R73.9] 03/18/2015  . Alcohol dependence (HCC) [F10.20] 12/07/2014  . Alcohol withdrawal (HCC) [F10.239] 12/07/2014  . Protein-calorie malnutrition, severe (HCC) [E43] 12/07/2014  . Musculoskeletal chest pain [R07.89]  12/07/2014  . Tobacco abuse [Z72.0] 12/07/2014  . Carbuncle and furuncle [L02.92, L02.93] 12/07/2014  . Macrocytic anemia [D53.9] 12/07/2014  . Chest pain [R07.9] 11/30/2014  . Tachycardia [R00.0] 11/30/2014  . COPD (chronic obstructive pulmonary disease) (HCC) [J44.9] 11/30/2014  . Cellulitis and abscess [L03.90, L02.91] 11/30/2014  . Atypical chest pain [R07.89]   . Dehydration [E86.0]     Total Time spent with patient: 30 minutes  HPI: Patient was seen and chart reviewed.  Rhonda Mitchell is a 55 y.o. Woman who reports prior history of anxiety, depressive disorder and alcohol use disorder for over 11 years. Patient endorses long history of alcohol abuse with history of detoxification and Rehab and longest sobriety of 9 months. She reports that she drinks 4 pints of Vodka  Daily and cannot function if she does not drink. However, she is now seeking for help to quit drinking because she had tried to do it herself and failed. She states that alcohol has put a strain on her relationship with her fiance and worry that it might excercebates her medical issues. She denies suicidal thoughts, psychosis or delusional thinking but reports occasional anxiety, apprehension and depressive symptoms. She reports that her current medications are helping to reduce the anxiety and depressive symptoms but would like to be referred to a 90 day Alcohol rehab program when she is medically stable.   Past Psychiatric History: MDD, Anxiety disorder, Alcohol use disorder severe.  Risk to Self: Is patient at risk for suicide?: No, but patient needs Medical Clearance Risk to Others:   Prior Inpatient Therapy:   Prior Outpatient Therapy:    Past Medical History:  Past Medical History  Diagnosis Date  . COPD (chronic obstructive pulmonary disease) (HCC)   . Anxiety   . Shortness of  breath   . Arthritis   . GERD (gastroesophageal reflux disease)   . Full dentures   . Insomnia   . Alcohol abuse   . Hepatitis C    . Alcohol abuse   . History of MRSA infection   . History of encephalopathy     Past Surgical History  Procedure Laterality Date  . Arm surgery    . Hand surgery  1990    both rt/lt carpal tunnels  . Shoulder surgery      right and left-age [age]  . Orif ulnar / radial shaft fracture  1990    right-got infection-had total 10 surgeies 1990  . Orif ulnar fracture Left 05/23/2014    Procedure: OPEN REDUCTION INTERNAL FIXATION (ORIF) LEFT ULNAR FRACTURE;  Surgeon: Harvie JuniorJohn L Graves, MD;  Location: [facility];  Service: Orthopedics;  Laterality: Left;  . I&d extremity Right 05/07/2015    Procedure: IRRIGATION AND DEBRIDEMENT  ELBOW ;  Surgeon: Dominica SeverinWilliam Gramig, MD;  Location: [facility] OR;  Service: Orthopedics;  Laterality: Right;   Family History:  Family History  Problem Relation Age of Onset  . Breast cancer Mother    Family Psychiatric  History: Unknown  Social History:  History  Alcohol Use  . Yes    Comment: 1/5th Vodka every day      History  Drug Use No    Comment: Hx: herion, cocaine 5 yrs ago    Social History   Social History  . Marital Status: Legally Separated    Spouse Name: N/A  . Number of Children: N/A  . Years of Education: N/A   Social History Main Topics  . Smoking status: Current Every Day Smoker -- 1.00 packs/day for 20 years    Types: Cigarettes  . Smokeless tobacco: Never Used  . Alcohol Use: Yes     Comment: 1/5th Vodka every day   . Drug Use: No     Comment: Hx: herion, cocaine 5 yrs ago  . Sexual Activity: Yes    Birth Control/ Protection: Post-menopausal   Other Topics Concern  . None   Social History Narrative   Additional Social History:    Allergies:  No Known Allergies  Labs:  No results found for this or any previous visit (from the past 48 hour(s)).  Current Facility-Administered Medications  Medication Dose Route Frequency Provider Last Rate Last Dose  . acetaminophen (TYLENOL) tablet 650 mg  650 mg Oral Q6H PRN  Ron ParkerHarvette C Jenkins, MD   650 mg at 02/27/16 0941   Or  . acetaminophen (TYLENOL) suppository 650 mg  650 mg Rectal Q6H PRN Ron ParkerHarvette C Jenkins, MD   650 mg at 02/20/16 2354  . albuterol (PROVENTIL) (2.5 MG/3ML) 0.083% nebulizer solution 2.5 mg  2.5 mg Nebulization Q4H PRN Christiane Haorinna L Sullivan, MD   2.5 mg at 02/24/16 0923  . alum & mag hydroxide-simeth (MAALOX/MYLANTA) 200-200-20 MG/5ML suspension 30 mL  30 mL Oral Q6H PRN Ron ParkerHarvette C Jenkins, MD      . benztropine (COGENTIN) tablet 0.5 mg  0.5 mg Oral BID Christiane Haorinna L Sullivan, MD   0.5 mg at 03/01/16 0909  . cloNIDine (CATAPRES) tablet 0.1 mg  0.1 mg Oral BID Christiane Haorinna L Sullivan, MD   0.1 mg at 02/29/16 2122  . DULoxetine (CYMBALTA) DR capsule 30 mg  30 mg Oral Daily Ron ParkerHarvette C Jenkins, MD   30 mg at 03/01/16 0910  . enoxaparin (LOVENOX) injection 40 mg  40 mg Subcutaneous Q24H Ilda BassetJennifer D [place],  RPH   40 mg at 02/29/16 1743  . gabapentin (NEURONTIN) capsule 100 mg  100 mg Oral TID Christiane Ha, MD   100 mg at 03/01/16 0906  . haloperidol (HALDOL) tablet 2 mg  2 mg Oral BID Christiane Ha, MD   2 mg at 03/01/16 0909  . haloperidol lactate (HALDOL) injection 2 mg  2 mg Intravenous Q6H PRN Maretta Bees, MD   2 mg at 03/01/16 1331  . LORazepam (ATIVAN) injection 1-2 mg  1-2 mg Intravenous Q4H PRN Maretta Bees, MD   2 mg at 03/01/16 1046  . LORazepam (ATIVAN) injection 1-2 mg  1-2 mg Intravenous Once PRN Maretta Bees, MD      . mometasone-formoterol [facility]) 200-5 MCG/ACT inhaler 2 puff  2 puff Inhalation BID Christiane Ha, MD   2 puff at 03/01/16 0856  . nicotine (NICODERM CQ - dosed in mg/24 hours) patch 21 mg  21 mg Transdermal Daily Rolan Lipa, NP   21 mg at 03/01/16 0913  . ondansetron (ZOFRAN) tablet 4 mg  4 mg Oral Q6H PRN Ron Parker, MD       Or  . ondansetron (ZOFRAN) injection 4 mg  4 mg Intravenous Q6H PRN Ron Parker, MD   4 mg at 02/28/16 2359  . oxyCODONE (Oxy IR/ROXICODONE) immediate  release tablet 5 mg  5 mg Oral Q6H PRN Christiane Ha, MD   5 mg at 03/01/16 1254  . sodium chloride flush (NS) 0.9 % injection 10-40 mL  10-40 mL Intracatheter Q12H Christiane Ha, MD   10 mL at 03/01/16 1000  . sodium chloride flush (NS) 0.9 % injection 10-40 mL  10-40 mL Intracatheter PRN Christiane Ha, MD   20 mL at 03/01/16 0828  . traZODone (DESYREL) tablet 50 mg  50 mg Oral QHS PRN Christiane Ha, MD   50 mg at 02/29/16 2244    Musculoskeletal: Strength & Muscle Tone: decreased Gait & Station: unable to stand Patient leans: N/A  Psychiatric Specialty Exam: ROS generalized weakness and not feeling good and weakness of her right leg for unknown reason. Patient denied nausea, vomiting, abdominal pain, shortness of breath and chest pain. No Fever-chills, No Headache, No changes with Vision or hearing, reports vertigo No problems swallowing food or Liquids, No Chest pain, Cough or Shortness of Breath, No Abdominal pain, No Nausea or Vommitting, Bowel movements are regular, No Blood in stool or Urine, No dysuria, No new skin rashes or bruises, No new joints pains-aches,  No new weakness, tingling, numbness in any extremity, No recent weight gain or loss, No polyuria, polydypsia or polyphagia,  A full 10 point Review of Systems was done, except as stated above, all other Review of Systems were negative.  Blood pressure 95/60, pulse 75, temperature 98.4 F (36.9 C), temperature source Oral, resp. rate 18, height 5\' 4"  (1.626 m), weight 54 kg (119 lb 0.8 oz), SpO2 93 %.Body mass index is 20.42 kg/(m^2).  General Appearance: Casual  Eye Contact::  Good  Speech:  Clear and Coherent  Volume:  Normal  Mood:  Anxious and Depressed  Affect:  Appropriate and Congruent  Thought Process:  Coherent and Goal Directed  Orientation:  Full (Time, Place, and Person)  Thought Content:  WDL  Suicidal Thoughts:  No  Homicidal Thoughts:  No  Memory:  Immediate;   Good Recent;    Fair  Judgement:  Intact  Insight:  Fair  Psychomotor Activity:  Decreased  Concentration:  Fair  Recall:  Good  Fund of Knowledge:Good  Language: Good  Akathisia:  NA  Handed:  Right  AIMS (if indicated):     Assets:  Communication Skills Desire for Improvement Financial Resources/Insurance Housing Intimacy Leisure Time Resilience Social Support Transportation  ADL's:  Impaired  Cognition: WNL  Sleep:   fair   Treatment Plan Summary: Daily contact with patient to assess and evaluate symptoms and progress in treatment.  Patient has no safety concerns and contract for safety  Patient CIWA is not significant at this time  Continue current medication management Cymbalta, Neurontin, Haldol for depression, anxiety and agitation  Refer to the psychiatric social service for substance abuse treatment program for rehabilitation and medically stable   Disposition: Patient may benefit from physical therapy when medically stable. Patient does not meet criteria for psychiatric inpatient admission. Supportive therapy provided about ongoing stressors.  Thedore Mins, MD 03/01/2016 2:17 PM

## 2016-03-02 ENCOUNTER — Inpatient Hospital Stay (HOSPITAL_COMMUNITY): Payer: Medicaid Other

## 2016-03-02 DIAGNOSIS — R269 Unspecified abnormalities of gait and mobility: Secondary | ICD-10-CM | POA: Insufficient documentation

## 2016-03-02 LAB — URINALYSIS, ROUTINE W REFLEX MICROSCOPIC
BILIRUBIN URINE: NEGATIVE
Glucose, UA: NEGATIVE mg/dL
Hgb urine dipstick: NEGATIVE
KETONES UR: NEGATIVE mg/dL
LEUKOCYTES UA: NEGATIVE
NITRITE: NEGATIVE
PH: 7.5 (ref 5.0–8.0)
PROTEIN: NEGATIVE mg/dL
Specific Gravity, Urine: 1.008 (ref 1.005–1.030)

## 2016-03-02 MED ORDER — LORAZEPAM 2 MG/ML IJ SOLN
1.0000 mg | Freq: Once | INTRAMUSCULAR | Status: AC | PRN
  Administered 2016-03-02: 2 mg via INTRAVENOUS

## 2016-03-02 NOTE — Progress Notes (Signed)
SLP Cancellation Note  Patient Details Name: Rhonda GillesMichelle Mitchell MRN: [id number] DOB: 23-Dec-1960   Cancelled treatment:       Reason Eval/Treat Not Completed: Patient at procedure or test/unavailable   Rhonda HamLaura Mitchell, M.A. CCC-SLP [phone]  Rhonda Hamaiewonsky, Rhonda Mitchell 03/02/2016, 10:29 AM

## 2016-03-02 NOTE — Progress Notes (Signed)
Interval History:                                                                                                                      Rhonda GillesMichelle Mitchell is an 55 y.o. female patient with  LE weakness. Still complaining of weakness but quickly and forcefully withdraws from plantar stimuli. MRI thoracic and lumbar showing no etiology for her weakness.    Past Medical History: Past Medical History  Diagnosis Date  . COPD (chronic obstructive pulmonary disease) (HCC)   . Anxiety   . Shortness of breath   . Arthritis   . GERD (gastroesophageal reflux disease)   . Full dentures   . Insomnia   . Alcohol abuse   . Hepatitis C   . Alcohol abuse   . History of MRSA infection   . History of encephalopathy     Past Surgical History  Procedure Laterality Date  . Arm surgery    . Hand surgery  1990    both rt/lt carpal tunnels  . Shoulder surgery      right and left-age [age]  . Orif ulnar / radial shaft fracture  1990    right-got infection-had total 10 surgeies 1990  . Orif ulnar fracture Left 05/23/2014    Procedure: OPEN REDUCTION INTERNAL FIXATION (ORIF) LEFT ULNAR FRACTURE;  Surgeon: Harvie JuniorJohn L Graves, MD;  Location: [facility];  Service: Orthopedics;  Laterality: Left;  . I&d extremity Right 05/07/2015    Procedure: IRRIGATION AND DEBRIDEMENT  ELBOW ;  Surgeon: Dominica SeverinWilliam Gramig, MD;  Location: [facility] OR;  Service: Orthopedics;  Laterality: Right;    Family History: Family History  Problem Relation Age of Onset  . Breast cancer Mother     Social History:   reports that she has been smoking Cigarettes.  She has a 20 pack-year smoking history. She has never used smokeless tobacco. She reports that she drinks alcohol. She reports that she does not use illicit drugs.  Allergies:  No Known Allergies   Medications:                                                                                                                         Current facility-administered medications:  .   acetaminophen (TYLENOL) tablet 650 mg, 650 mg, Oral, Q6H PRN, 650 mg at 02/27/16 0941 **OR** acetaminophen (TYLENOL) suppository 650 mg, 650 mg, Rectal, Q6H PRN, Ron ParkerHarvette C Jenkins, MD, 650 mg at 02/20/16 2354 .  albuterol (PROVENTIL) (2.5 MG/3ML)  0.083% nebulizer solution 2.5 mg, 2.5 mg, Nebulization, Q4H PRN, Christiane Ha, MD, 2.5 mg at 02/24/16 0923 .  alum & mag hydroxide-simeth (MAALOX/MYLANTA) 200-200-20 MG/5ML suspension 30 mL, 30 mL, Oral, Q6H PRN, Ron Parker, MD .  benztropine (COGENTIN) tablet 0.5 mg, 0.5 mg, Oral, BID, Christiane Ha, MD, 0.5 mg at 03/02/16 0956 .  cloNIDine (CATAPRES) tablet 0.1 mg, 0.1 mg, Oral, BID, Christiane Ha, MD, 0.1 mg at 03/02/16 0956 .  DULoxetine (CYMBALTA) DR capsule 30 mg, 30 mg, Oral, Daily, Ron Parker, MD, 30 mg at 03/02/16 0957 .  enoxaparin (LOVENOX) injection 40 mg, 40 mg, Subcutaneous, Q24H, Lynita Lombard [place], RPH, 40 mg at 03/01/16 1635 .  gabapentin (NEURONTIN) capsule 100 mg, 100 mg, Oral, TID, Christiane Ha, MD, 100 mg at 03/02/16 0956 .  haloperidol (HALDOL) tablet 2 mg, 2 mg, Oral, BID, Christiane Ha, MD, 2 mg at 03/02/16 0956 .  haloperidol lactate (HALDOL) injection 2 mg, 2 mg, Intravenous, Q6H PRN, Maretta Bees, MD, 2 mg at 03/02/16 0457 .  LORazepam (ATIVAN) injection 1-2 mg, 1-2 mg, Intravenous, Q4H PRN, Maretta Bees, MD, 2 mg at 03/02/16 0137 .  mometasone-formoterol (DULERA) 200-5 MCG/ACT inhaler 2 puff, 2 puff, Inhalation, BID, Christiane Ha, MD, 2 puff at 03/02/16 0829 .  nicotine (NICODERM CQ - dosed in mg/24 hours) patch 21 mg, 21 mg, Transdermal, Daily, Rolan Lipa, NP, 21 mg at 03/02/16 1004 .  ondansetron (ZOFRAN) tablet 4 mg, 4 mg, Oral, Q6H PRN **OR** ondansetron (ZOFRAN) injection 4 mg, 4 mg, Intravenous, Q6H PRN, Ron Parker, MD, 4 mg at 02/28/16 2359 .  oxyCODONE (Oxy IR/ROXICODONE) immediate release tablet 5 mg, 5 mg, Oral, Q6H PRN, Christiane Ha,  MD, 5 mg at 03/02/16 0457 .  sodium chloride flush (NS) 0.9 % injection 10-40 mL, 10-40 mL, Intracatheter, Q12H, Christiane Ha, MD, 10 mL at 03/02/16 0958 .  sodium chloride flush (NS) 0.9 % injection 10-40 mL, 10-40 mL, Intracatheter, PRN, Christiane Ha, MD, 10 mL at 03/02/16 0837 .  traZODone (DESYREL) tablet 50 mg, 50 mg, Oral, QHS PRN, Christiane Ha, MD, 50 mg at 02/29/16 2244   Neurologic Examination:                                                                                                     Today's Vitals   03/01/16 2106 03/01/16 2124 03/01/16 2242 03/02/16 0432  BP:  120/72 106/65 116/79  Pulse:  65 70 58  Temp:  98.3 F (36.8 C) 97.8 F (36.6 C) 97.7 F (36.5 C)  TempSrc:  Oral Oral Oral  Resp:  Height:      Weight:      SpO2:  96% 95% 94%  PainSc: Asleep       Evaluation of higher integrative functions including: Level of alertness: Alert,  Oriented to time, place and person Speech: fluent, no evidence of dysarthria or aphasia noted.  Test the following cranial nerves: 2-12 grossly intact Motor examination: Normal tone, bulk,  full 5/5 motor strength in all 4 extremities Examination of sensation : Normal and symmetric sensation to pinprick in all 4 extremities and on face Examination of deep tendon reflexes: 2+, normal and symmetric in all extremities, normal plantars bilaterally Test coordination: Normal finger nose testing,   Lab Results: Basic Metabolic Panel:  Recent Labs Lab 02/25/16 0445 02/26/16 1137 02/27/16 0250 02/28/16 0522  NA 142 140 141 141  K 3.3* 4.0 4.1 4.8  CL 113* 111 107 107  CO2 21* GLUCOSE 113* 132* 109* 95  BUN <5* <5* <5* 6  CREATININE 0.44 0.55 0.55 0.57  CALCIUM 7.6* 8.4* 8.7* 8.6*  MG  --  1.6* 1.6* 1.7    Liver Function Tests: No results for input(s): AST, ALT, ALKPHOS, BILITOT, PROT, ALBUMIN in the last 168 hours. No results for input(s): LIPASE, AMYLASE in the last 168 hours. No  results for input(s): AMMONIA in the last 168 hours.  CBC:  Recent Labs Lab 02/25/16 0445 02/25/16 1009 02/27/16 0250  WBC 3.7*  --  4.4  HGB 8.3*  --  9.7*  HCT 27.1* 30.2* 30.5*  MCV 96.4  --  96.8  PLT 144*  --  188    Cardiac Enzymes: No results for input(s): CKTOTAL, CKMB, CKMBINDEX, TROPONINI in the last 168 hours.  Lipid Panel: No results for input(s): CHOL, TRIG, HDL, CHOLHDL, VLDL, LDLCALC in the last 168 hours.  CBG: No results for input(s): GLUCAP in the last 168 hours.  Microbiology: Results for orders placed or performed during the hospital encounter of 02/19/16  Urine culture     Status: None   Collection Time: 02/19/16 10:29 PM  Result Value Ref Range Status   Specimen Description URINE, RANDOM  Final   Special Requests NONE  Final   Culture >=100,000 COLONIES/mL ESCHERICHIA COLI  Final   Report Status 02/22/2016 FINAL  Final   Organism ID, Bacteria ESCHERICHIA COLI  Final      Susceptibility   Escherichia coli - MIC*    AMPICILLIN 8 SENSITIVE Sensitive     CEFAZOLIN <=4 SENSITIVE Sensitive     CEFTRIAXONE <=1 SENSITIVE Sensitive     CIPROFLOXACIN <=0.25 SENSITIVE Sensitive     GENTAMICIN <=1 SENSITIVE Sensitive     IMIPENEM <=0.25 SENSITIVE Sensitive     NITROFURANTOIN <=16 SENSITIVE Sensitive     TRIMETH/SULFA <=20 SENSITIVE Sensitive     AMPICILLIN/SULBACTAM 4 SENSITIVE Sensitive     PIP/TAZO <=4 SENSITIVE Sensitive     * >=100,000 COLONIES/mL ESCHERICHIA COLI  MRSA PCR Screening     Status: None   Collection Time: 02/20/16  6:41 PM  Result Value Ref Range Status   MRSA by PCR NEGATIVE NEGATIVE Final    Comment:        The GeneXpert MRSA Assay (FDA approved for NASAL specimens only), is one component of a comprehensive MRSA colonization surveillance program. It is not intended to diagnose MRSA infection nor to guide or monitor treatment for MRSA infections.     Imaging: Mr Thoracic Spine Wo Contrast  03/01/2016  CLINICAL DATA:   Initial evaluation for lower extremity weakness. EXAM: MRI THORACIC SPINE WITHOUT CONTRAST TECHNIQUE: Multiplanar, multisequence MR imaging of the thoracic spine was performed. No intravenous contrast was administered. COMPARISON:  None. FINDINGS: Study fairly degraded by motion artifact. Vertebral bodies are normally aligned with preservation of the normal thoracic kyphosis. No listhesis. Mild chronic height loss at the superior endplate of T11. Vertebral body heights otherwise maintained. No fracture  or malalignment. Signal intensity within the vertebral body bone marrow is normal. No focal osseous lesions. Minimal edema at the superior endplate of T2 seen associated with a small Schmorl's node. No other marrow edema. Signal intensity within the thoracic spinal cord is grossly normal. No acute paraspinous soft tissue abnormality. Visualized lungs are grossly clear. T2 hyperintense cyst noted within the partially visualized left kidney. Visualized visceral structures are otherwise unremarkable. No significant degenerative changes identified within the thoracic spine. No focal disc herniation. No evidence for cord compression or significant stenosis. IMPRESSION: 1. Motion degraded study. 2. Grossly normal MRI of the thoracic spine. No evidence for cord compression or significant stenosis to explain bilateral lower extremity symptoms. No abnormal cord signal. Electronically Signed   By: Rise Mu M.D.   On: 03/01/2016 18:49    Assessment and plan:   Rhonda Mitchell is an 55 y.o. female patient with subjective LE weakness and inability to ambulate. Exam shows no LE weakness. MRI thoracic and lumbar shows no etiology for her weakness. Will obtain MRI C-spine to be complete. IF normal no further recommendations other than PT.   Seen with Dr. Lavon Paganini. Please see his attestation note for A/P for any additional work up recommendations.

## 2016-03-02 NOTE — Progress Notes (Signed)
Physical Therapy Treatment Patient Details Name: Rhonda Mitchell MRN: [id number] DOB: 11-06-1961 Today's Date: 03/02/2016    History of Present Illness Rhonda Mitchell is a 55 y.o. female with a history of ETOH Abuse, COPD, Hep C admitted with burning Epigastric ABD Pain and SOB, and Nausea Vomiting and Diarrhea.She drinks 3-4 pints of liquor daily and has done so for many years . Her Alcohol level was found at 298. She has a UTI and placed on ETOH withdrawal protocol    PT Comments    Pt with continued impaired cognition, safety awareness, impulsivity and inability to follow consistent commands. Pt with maintained need for 2 person assist with gait due to cognition and balance deficits. Pt educated for deficits and need for assist but does not demonstrate any learning throughout session. Will continue to follow to maximize mobility and safety.   Follow Up Recommendations  SNF;Supervision/Assistance - 24 hour     Equipment Recommendations  Rolling walker with 5" wheels    Recommendations for Other Services       Precautions / Restrictions Precautions Precautions: Fall Restrictions Weight Bearing Restrictions: No    Mobility  Bed Mobility Overal bed mobility: Needs Assistance Bed Mobility: Supine to Sit     Supine to sit: Min guard     General bed mobility comments: guarding for safety as pt impulsive and moves quickly, able to get to sitting on her own. pt able to don underwear EoB with min assist and mod assit to stand to pull them up. Mod assist for pants   Transfers Overall transfer level: Needs assistance   Transfers: Sit to/from Stand Sit to Stand: Min assist;Mod assist         General transfer comment: varied assist min-mod assist at least 6 trials from varied surfaces throughout session. Poor balance and safety awareness with lack of insight into fall risk  Ambulation/Gait Ambulation/Gait assistance: Mod assist;+2 safety/equipment Ambulation Distance  (Feet): 150 Feet Assistive device: Rolling walker (2 wheeled) Gait Pattern/deviations: Shuffle;Trunk flexed;Wide base of support;Ataxic;Decreased dorsiflexion - left;Decreased dorsiflexion - right   Gait velocity interpretation: Below normal speed for age/gender General Gait Details: Pt toe walks with wide BOS, flexed trunk, self posterior to walker, ataxic gait and will sit without warning with chair closely following throughout. Pt walked 50', 100', 3', 150' with mod assist to control RW, max cues for sequence and safety with pt not adhering to commands at all and requires physical assist with gait for safety   Stairs            Wheelchair Mobility    Modified Rankin (Stroke Patients Only)       Balance Overall balance assessment: Needs assistance   Sitting balance-Leahy Scale: Fair       Standing balance-Leahy Scale: Poor                      Cognition Arousal/Alertness: Awake/alert Behavior During Therapy: Impulsive Overall Cognitive Status: Impaired/Different from baseline Area of Impairment: Orientation;Memory;Following commands;Safety/judgement;Problem solving;Awareness Orientation Level: Disoriented to;Time Current Attention Level: Sustained Memory: Decreased short-term memory Following Commands: Follows one step commands inconsistently Safety/Judgement: Decreased awareness of safety;Decreased awareness of deficits   Problem Solving: Slow processing;Difficulty sequencing;Requires verbal cues General Comments: impulsive, sitting and moving without warning. "don't you see I have to do it my way"- unable to don clothes without assist    Exercises      General Comments        Pertinent Vitals/Pain Faces Pain Scale: Hurts little  more Pain Location: bil LE and UE  Pain Intervention(s): Limited activity within patient's tolerance;Monitored during session    Home Living                      Prior Function            PT Goals (current  goals can now be found in the care plan section) Progress towards PT goals: Progressing toward goals    Frequency       PT Plan Discharge plan needs to be updated    Co-evaluation             End of Session Equipment Utilized During Treatment: Gait belt Activity Tolerance: Patient tolerated treatment well Patient left: in chair;with call bell/phone within reach;with chair alarm set     Time: [id number] PT Time Calculation (min) (ACUTE ONLY): 23 min  Charges:  $Gait Training: 8-22 mins $Therapeutic Activity: 8-22 mins                    G Codes:      Delorse Lekabor, Nahlia Hellmann Beth 03/02/2016, 9:54 AM Delaney MeigsMaija Tabor Louiza Moor, PT [phone]

## 2016-03-02 NOTE — Progress Notes (Signed)
PATIENT DETAILS Name: Rhonda Mitchell Age: 55 y.o. Sex: female Date of Birth: Dec 07, 1961 Admit Date: 02/19/2016 Admitting Physician Ron Parker, MD PCP:No primary care provider on file.  Subjective: Awake alert-in a good mood today  Assessment/Plan: Principal Problem: Alcohol withdrawal: Resolved, managed with Ativan per protocol. Now awake alert, not tremulous.  Active Problems: Lower extremity weakness: Exam seems to be inconsistent at times-but does appear to have mild weakness-which has improved over the past few days. Knee reflex are brisk.  MRI of LS discussed with neurosurgery-Dr. Yetta Barre over the phone-he does not thinkthat any of these changes can account for patient's symptoms.MRI T Spine neg for major abnormalities. Neurology recommends that we get a C Spine MRI.TSH/vitamin B12 within normal limits.Continue PT evaluation. Per patient, she is still unable to ambulate, claims that she was at times crawling to get around 2-3 weeks prior to this admission. Will require SNF on discharge.  COPD: Lungs clear, continue bronchodilators.  Hypokalemia/hypomagnesemia: Suspect secondary to GI loss and alcohol.    Anemia:  Secondary to chronic disease-no evidence of blood loss. Follow.  Thrombocytopenia: Suspect secondary to alcohol use, improving.  Escherichia coli UTI: Completed course of antibiotics.  History of chronic hepatitis C: Stable for outpatient follow-up.  History of depression/alcohol induced mood disorder:  anxious, very tearful today-Continue with Haldol, Cymbalta, Neurontin. Psychiatry consulted during this hospital stay  Disposition: Remain inpatient- SNF when bed available-once thoracic MRI is completed.   Antimicrobial agents  See below  Anti-infectives    Start     Dose/Rate Route Frequency Ordered Stop   02/22/16 1800  sulfamethoxazole-trimethoprim (BACTRIM DS,SEPTRA DS) 800-160 MG per tablet 1 tablet  Status:  Discontinued     1  tablet Oral Every 12 hours 02/22/16 1000 02/24/16 0944   02/21/16 0300  cefTRIAXone (ROCEPHIN) 1 g in dextrose 5 % 50 mL IVPB  Status:  Discontinued     1 g 100 mL/hr over 30 Minutes Intravenous Every 24 hours 02/20/16 0436 02/22/16 0959   02/20/16 1030  metroNIDAZOLE (FLAGYL) IVPB 500 mg  Status:  Discontinued     500 mg 100 mL/hr over 60 Minutes Intravenous Every 8 hours 02/20/16 1030 02/21/16 1146   02/20/16 1000  vancomycin (VANCOCIN) 50 mg/mL oral solution 125 mg  Status:  Discontinued     125 mg Oral 4 times daily 02/20/16 0833 02/20/16 1030   02/20/16 0300  cefTRIAXone (ROCEPHIN) 1 g in dextrose 5 % 50 mL IVPB     1 g 100 mL/hr over 30 Minutes Intravenous  Once 02/20/16 0254 02/20/16 0459      DVT Prophylaxis: Lovenox  Code Status: Full code  Family Communication None at bedside  Procedures: None  CONSULTS:  Neurology  Time spent 25 minutes-Greater than 50% of this time was spent in counseling, explanation of diagnosis, planning of further management, and coordination of care.  MEDICATIONS: Scheduled Meds: . benztropine  0.5 mg Oral BID  . cloNIDine  0.1 mg Oral BID  . DULoxetine  30 mg Oral Daily  . enoxaparin (LOVENOX) injection  40 mg Subcutaneous Q24H  . gabapentin  100 mg Oral TID  . haloperidol  2 mg Oral BID  . mometasone-formoterol  2 puff Inhalation BID  . nicotine  21 mg Transdermal Daily  . sodium chloride flush  10-40 mL Intracatheter Q12H   Continuous Infusions:  PRN Meds:.acetaminophen **OR** acetaminophen, albuterol, alum & mag hydroxide-simeth, haloperidol lactate, LORazepam,  ondansetron **OR** ondansetron (ZOFRAN) IV, oxyCODONE, sodium chloride flush, traZODone    PHYSICAL EXAM: Vital signs in last 24 hours: Filed Vitals:   03/01/16 1338 03/01/16 2124 03/01/16 2242 03/02/16 0432  BP: 95/60 120/72 106/65 116/79  Pulse: 75 65 70 58  Temp: 98.4 F (36.9 C) 98.3 F (36.8 C) 97.8 F (36.6 C) 97.7 F (36.5 C)  TempSrc: Oral Oral Oral Oral   Resp: Height:      Weight:      SpO2: 93% 96% 95% 94%    Weight change:  Filed Weights   02/20/16 1956  Weight: 54 kg (119 lb 0.8 oz)   Body mass index is 20.42 kg/(m^2).   Gen Exam: Awake and alert-anxious this morning Neck: Supple, No JVD.   Chest: B/L Clear.   CVS: S1 S2 Regular, no murmurs.  Abdomen: soft, BS +, non tender, non distended.  Extremities: No edema, lower extremities warm to touch Neurologic:   lower extremity weakness 4/5.   generalized weakness as well.  Skin: No Rash.   Wounds: N/A.    Intake/Output from previous day:  Intake/Output Summary (Last 24 hours) at 03/02/16 1128 Last data filed at 03/02/16 0900  Gross per 24 hour  Intake    960 ml  Output   1650 ml  Net   -690 ml     LAB RESULTS: CBC  Recent Labs Lab 02/25/16 0445 02/25/16 1009 02/27/16 0250  WBC 3.7*  --  4.4  HGB 8.3*  --  9.7*  HCT 27.1* 30.2* 30.5*  PLT 144*  --  188  MCV 96.4  --  96.8  MCH 29.5  --  30.8  MCHC 30.6  --  31.8  RDW 21.3*  --  21.7*    Chemistries   Recent Labs Lab 02/25/16 0445 02/26/16 1137 02/27/16 0250 02/28/16 0522  NA 142 140 141 141  K 3.3* 4.0 4.1 4.8  CL 113* 111 107 107  CO2 21* GLUCOSE 113* 132* 109* 95  BUN <5* <5* <5* 6  CREATININE 0.44 0.55 0.55 0.57  CALCIUM 7.6* 8.4* 8.7* 8.6*  MG  --  1.6* 1.6* 1.7    CBG: No results for input(s): GLUCAP in the last 168 hours.  GFR Estimated Creatinine Clearance: 68.5 mL/min (by C-G formula based on Cr of 0.57).  Coagulation profile No results for input(s): INR, PROTIME in the last 168 hours.  Cardiac Enzymes No results for input(s): CKMB, TROPONINI, MYOGLOBIN in the last 168 hours.  Invalid input(s): CK  Invalid input(s): POCBNP No results for input(s): DDIMER in the last 72 hours. No results for input(s): HGBA1C in the last 72 hours. No results for input(s): CHOL, HDL, LDLCALC, TRIG, CHOLHDL, LDLDIRECT in the last 72 hours. No results for input(s):  TSH, T4TOTAL, T3FREE, THYROIDAB in the last 72 hours.  Invalid input(s): FREET3 No results for input(s): VITAMINB12, FOLATE, FERRITIN, TIBC, IRON, RETICCTPCT in the last 72 hours. No results for input(s): LIPASE, AMYLASE in the last 72 hours.  Urine Studies No results for input(s): UHGB, CRYS in the last 72 hours.  Invalid input(s): UACOL, UAPR, USPG, UPH, UTP, UGL, UKET, UBIL, UNIT, UROB, ULEU, UEPI, UWBC, URBC, UBAC, CAST, UCOM, BILUA  MICROBIOLOGY: No results found for this or any previous visit (from the past 240 hour(s)).  RADIOLOGY STUDIES/RESULTS: Mr Thoracic Spine Wo Contrast  03/01/2016  CLINICAL DATA:  Initial evaluation for lower extremity weakness. EXAM: MRI THORACIC SPINE WITHOUT CONTRAST TECHNIQUE:  Multiplanar, multisequence MR imaging of the thoracic spine was performed. No intravenous contrast was administered. COMPARISON:  None. FINDINGS: Study fairly degraded by motion artifact. Vertebral bodies are normally aligned with preservation of the normal thoracic kyphosis. No listhesis. Mild chronic height loss at the superior endplate of T11. Vertebral body heights otherwise maintained. No fracture or malalignment. Signal intensity within the vertebral body bone marrow is normal. No focal osseous lesions. Minimal edema at the superior endplate of T2 seen associated with a small Schmorl's node. No other marrow edema. Signal intensity within the thoracic spinal cord is grossly normal. No acute paraspinous soft tissue abnormality. Visualized lungs are grossly clear. T2 hyperintense cyst noted within the partially visualized left kidney. Visualized visceral structures are otherwise unremarkable. No significant degenerative changes identified within the thoracic spine. No focal disc herniation. No evidence for cord compression or significant stenosis. IMPRESSION: 1. Motion degraded study. 2. Grossly normal MRI of the thoracic spine. No evidence for cord compression or significant stenosis to  explain bilateral lower extremity symptoms. No abnormal cord signal. Electronically Signed   By: Rise Mu M.D.   On: 03/01/2016 18:49   Mr Lumbar Spine W Wo Contrast  02/26/2016  CLINICAL DATA:  Profound bilateral lower extremity weakness, right greater than left. EXAM: MRI LUMBAR SPINE WITHOUT AND WITH CONTRAST TECHNIQUE: Multiplanar and multiecho pulse sequences of the lumbar spine were obtained without and with intravenous contrast. CONTRAST:  10 mL MultiHance COMPARISON:  CT abdomen and pelvis 12/18/2015. FINDINGS: Normal signal is present in the conus medullaris which terminates at L1. Bilateral L5 pars defects are present. Grade 1 anterolisthesis at L5-S1 is stable, measuring 6 mm. There is chronic fatty marrow infiltration of the L5 vertebral body and as well as the inferior endplate of L4 and superior endplate of S1. Marrow signal, vertebral body heights, and alignment are otherwise normal. Limited imaging of the abdomen is unremarkable. The disc levels at L2-3 and above are normal. L3-4: A mild broad-based disc protrusion is present. There is mild facet hypertrophy bilaterally. No significant stenosis is present. L4-5: A broad-based disc protrusion is present. There is mild facet hypertrophy bilaterally. Slight retrolisthesis is noted. The disc extends into the right neural foramen without significant stenosis. L5-S1: There is uncovering of a broad-based disc protrusion associated with the anterolisthesis. In combination with facet spurring this leads to moderate to severe foraminal narrowing bilaterally, left greater than right. IMPRESSION: 1. 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 2. Uncovering of a broad-based disc protrusion and facet spurring at L5-S1 leads to moderate to severe foraminal stenosis bilaterally, left greater than right. 3. Slight retrolisthesis and a rightward disc protrusion at L4-5 without significant stenosis. 4. Mild broad-based  disc protrusion and facet hypertrophy at L3-4 without significant stenosis. Electronically Signed   By: Marin Roberts M.D.   On: 02/26/2016 19:44   Dg Abd Acute W/chest  02/19/2016  CLINICAL DATA:  55 year old female with abdominal pain EXAM: DG ABDOMEN ACUTE W/ 1V CHEST COMPARISON:  The CT dated 12/18/2015 FINDINGS: The lungs are clear. No pleural effusion or pneumothorax. The cardiac silhouette is within normal limits. There is no evidence of bowel obstruction or free air. No radiopaque calculi identified. IMPRESSION: No radiopaque foreign object. There is degenerative changes of the osseous structures. No acute fracture. Electronically Signed   By: Elgie Collard M.D.   On: 02/19/2016 23:42    Jeoffrey Massed, MD  [facility] Pager:[phone]  If 7PM-7AM, please contact night-coverage [email or web address]  Password [email or web address] 03/02/2016, 11:28 AM   LOS: 11 days

## 2016-03-02 NOTE — Progress Notes (Signed)
Pt a/c, c/o pain and anxiety PRN meds given as ordered, pt very impulsive trying to get oob, pt very unsteady, bed alarm on, pt in camera room, staff frequent rounding, safety maintained, pt unable to get MRI done today, pt was not still for test and was "swinging on staff"

## 2016-03-02 NOTE — Progress Notes (Signed)
Occupational Therapy Treatment Patient Details Name: Rhonda Mitchell MRN: [id number] DOB: January 02, 1961 Today's Date: 03/02/2016    History of present illness Rhonda Mitchell is a 55 y.o. female with a history of ETOH Abuse, COPD, Hep C admitted with burning Epigastric ABD Pain and SOB, and Nausea Vomiting and Diarrhea.She drinks 3-4 pints of liquor daily and has done so for many years . Her Alcohol level was found at 298. She has a UTI and placed on ETOH withdrawal protocol   OT comments  Limited session secondary to pt. In/out of sleep and unable to sustain arousal for full participation in therapy session. Able to complete bed mobility multiple times and requires min guard for impulsivity management.  Will continue to follow acutely.   Follow Up Recommendations  CIR;Supervision/Assistance - 24 hour    Equipment Recommendations  3 in 1 bedside comode;Other (comment)    Recommendations for Other Services      Precautions / Restrictions Precautions Precautions: Fall Precaution Comments: watch o2       Mobility Bed Mobility Overal bed mobility: Needs Assistance Bed Mobility: Rolling;Sidelying to Sit     Supine to sit: Min guard Sit to supine: Min guard   General bed mobility comments: impulsive with multiple supine to sit eob then lying back down both without warning, refusing to scoot up in bed at end of session. even with no leg room at foot of bed  Transfers Overall transfer level: Needs assistance   Transfers: Sit to/from Stand Sit to Stand: Min assist;Mod assist         General transfer comment: no sit/stand or transfers this session    Balance Overall balance assessment: Needs assistance   Sitting balance-Leahy Scale: Fair       Standing balance-Leahy Scale: Poor                     ADL Overall ADL's : Needs assistance/impaired                     Lower Body Dressing: Min guard;Sitting/lateral leans Lower Body Dressing Details  (indicate cue type and reason): adjust and reposition socks while seated eob               General ADL Comments: pt. sleeping and difficulty maintaining arousal for full participation.  transitioning from nice calm voice to yelling and cursing at therapist asst.       Vision                     Perception     Praxis      Cognition   Behavior During Therapy: Agitated;Impulsive Overall Cognitive Status: Impaired/Different from baseline Area of Impairment: Orientation;Memory;Following commands;Safety/judgement;Problem solving;Awareness Orientation Level: Disoriented to;Time Current Attention Level: Sustained Memory: Decreased short-term memory  Following Commands: Follows one step commands inconsistently Safety/Judgement: Decreased awareness of safety;Decreased awareness of deficits   Problem Solving: Slow processing;Difficulty sequencing;Requires verbal cues General Comments: impulsive, sitting and moving without warning. "don't you see I have to do it my way"- unable to don clothes without assist    Extremity/Trunk Assessment               Exercises     Shoulder Instructions       General Comments      Pertinent Vitals/ Pain       Pain Assessment: No/denies pain Faces Pain Scale: Hurts little more Pain Location: bil LE and UE  Pain Intervention(s): Limited activity within patient's  tolerance;Monitored during session  Home Living                                          Prior Functioning/Environment              Frequency Min 2X/week     Progress Toward Goals  OT Goals(current goals can now be found in the care plan section)  Progress towards OT goals: Progressing toward goals     Plan Discharge plan remains appropriate    Co-evaluation                 End of Session     Activity Tolerance Patient limited by lethargy   Patient Left in bed;with call bell/phone within reach;with bed alarm set   Nurse  Communication          Time: [id number] OT Time Calculation (min): 9 min  Charges: OT General Charges $OT Visit: 1 Procedure OT Treatments $Self Care/Home Management : 8-22 mins  Robet LeuMorris, [name] [name], COTA/L 03/02/2016, 1:27 PM

## 2016-03-02 NOTE — Clinical Social Work Note (Signed)
The patient still has no bed offers at this time. CSW contact [facility] states they did not receive the referral. CSW has refaxed referral.   Rhonda Mitchell Rhonda Mitchell Needs MSW, [place]CS[place], [place], [phone]

## 2016-03-02 NOTE — Clinical Social Work Note (Signed)
[facility] be able to offer the patient a bed at this time. [facility] has not responded. All facilities in this area have denied the patient a bed. CSW discussed case with CSW AD. ALF options will also be explored for placement. CSW will continue to assist.   Rhonda Mitchell Vicky Schleich MSW, [place], [place], [phone]

## 2016-03-03 ENCOUNTER — Inpatient Hospital Stay (HOSPITAL_COMMUNITY): Payer: Medicaid Other

## 2016-03-03 DIAGNOSIS — F10239 Alcohol dependence with withdrawal, unspecified: Secondary | ICD-10-CM

## 2016-03-03 DIAGNOSIS — D638 Anemia in other chronic diseases classified elsewhere: Secondary | ICD-10-CM

## 2016-03-03 MED ORDER — LORAZEPAM 2 MG/ML IJ SOLN
1.0000 mg | Freq: Once | INTRAMUSCULAR | Status: AC | PRN
  Administered 2016-03-03: 2 mg via INTRAVENOUS
  Filled 2016-03-03: qty 1

## 2016-03-03 NOTE — Clinical Social Work Note (Signed)
CSW notes that the patient has been deemed to have capacity at this time. CSW will not be able to place the patient as the patient is not agreeable to SNF at this time.   Rhonda Mitchell MSW, [place]CS[place], [place], [phone]

## 2016-03-03 NOTE — Progress Notes (Signed)
Attempted MRI again with medication. Patient still unable to cooperate and hold still for the exam. Patient returned to the floor and RN notified.

## 2016-03-03 NOTE — Clinical Social Work Note (Signed)
CSW met with patient to discuss and update on discharge plan. The patient currently has a sitter at bedside. The patient is adamantly refusing SNF placement at this time and states that she will be returning to her home of 11 years. The patient was able to tell CSW that she is at [facility], she knows her date of birth, and she states that she was hospitalized as a result of her drinking.When asked what year it is the patient stated that it was 2016. CSW has requested that patient be evaluated for capacity to make her own decisions as the patient states she will not go to SNF.   Liz Beach MSW, [place], [place], [phone]

## 2016-03-03 NOTE — Progress Notes (Signed)
PATIENT DETAILS Name: Rhonda Mitchell Age: 55 y.o. Sex: female Date of Birth: 1961-10-31 Admit Date: 02/19/2016 Admitting Physician Ron ParkerHarvette C Jenkins, MD PCP:No primary care provider on file.  Subjective: Awake alert-able to lift both legs off bed easily today  Assessment/Plan: Principal Problem: Alcohol withdrawal: Resolved, managed with Ativan per protocol. Now awake alert, not tremulous.  Active Problems: Lower extremity weakness: Exam seems to be inconsistent at times-initially did appear to have mild lower ext weakness Right >left-but seems to have improved over the past few days. Knee reflex are brisk.  MRI of LS spine and MRI thoracic spine negative for acute abnormalities. Note MRI LS spine was discussed with neurosurgery-Dr. Yetta BarreJones over the phone-he does not think that any of the mentioned changes can account for patient's symptoms.Neurology consulted-recommended MRI C Spine-however unable to complete x 2 even with IV Ativan. I doubt clinically that patient has C Spine abnormalities-and doubt utility of C spine. Have spoke with Dr Lavon PaganiniNandigam today-he will evaluate and let us know. TSH/vitamin B12 within normal limits.PT eval recommending SNF-however patient refusing SNF for rehab.Seen by psych today-has capacity-per patient she wants to go home.   COPD: Lungs clear, continue bronchodilators.  Hypokalemia/hypomagnesemia: Suspect secondary to GI loss and alcohol.    Anemia:  Secondary to chronic disease-no evidence of blood loss. Follow.  Thrombocytopenia: Suspect secondary to alcohol use, improving.  Escherichia coli UTI: Completed course of antibiotics.  History of chronic hepatitis C: Stable for outpatient follow-up.  History of depression/alcohol induced mood disorder:  Continue with Haldol, Cymbalta, Neurontin. Psychiatry consulted during this hospital stay-deemed to have capacity to make decisions  Disposition: Remain inpatient- home  tomorrow  Antimicrobial agents  See below  Anti-infectives    Start     Dose/Rate Route Frequency Ordered Stop   02/22/16 1800  sulfamethoxazole-trimethoprim (BACTRIM DS,SEPTRA DS) 800-160 MG per tablet 1 tablet  Status:  Discontinued     1 tablet Oral Every 12 hours 02/22/16 1000 02/24/16 0944   02/21/16 0300  cefTRIAXone (ROCEPHIN) 1 g in dextrose 5 % 50 mL IVPB  Status:  Discontinued     1 g 100 mL/hr over 30 Minutes Intravenous Every 24 hours 02/20/16 0436 02/22/16 0959   02/20/16 1030  metroNIDAZOLE (FLAGYL) IVPB 500 mg  Status:  Discontinued     500 mg 100 mL/hr over 60 Minutes Intravenous Every 8 hours 02/20/16 1030 02/21/16 1146   02/20/16 1000  vancomycin (VANCOCIN) 50 mg/mL oral solution 125 mg  Status:  Discontinued     125 mg Oral 4 times daily 02/20/16 0833 02/20/16 1030   02/20/16 0300  cefTRIAXone (ROCEPHIN) 1 g in dextrose 5 % 50 mL IVPB     1 g 100 mL/hr over 30 Minutes Intravenous  Once 02/20/16 0254 02/20/16 0459      DVT Prophylaxis: Lovenox  Code Status: Full code  Family Communication None at bedside  Procedures: None  CONSULTS:  Neurology  Time spent 25 minutes-Greater than 50% of this time was spent in counseling, explanation of diagnosis, planning of further management, and coordination of care.  MEDICATIONS: Scheduled Meds: . benztropine  0.5 mg Oral BID  . cloNIDine  0.1 mg Oral BID  . DULoxetine  30 mg Oral Daily  . enoxaparin (LOVENOX) injection  40 mg Subcutaneous Q24H  . gabapentin  100 mg Oral TID  . haloperidol  2 mg Oral BID  . mometasone-formoterol  2 puff Inhalation BID  .  nicotine  21 mg Transdermal Daily  . sodium chloride flush  10-40 mL Intracatheter Q12H   Continuous Infusions:  PRN Meds:.acetaminophen **OR** acetaminophen, albuterol, alum & mag hydroxide-simeth, haloperidol lactate, LORazepam, ondansetron **OR** ondansetron (ZOFRAN) IV, oxyCODONE, sodium chloride flush, traZODone    PHYSICAL EXAM: Vital signs in  last 24 hours: Filed Vitals:   03/02/16 1936 03/02/16 2156 03/03/16 0604 03/03/16 0833  BP:  99/57 107/56   Pulse:  86 71   Temp:  97.5 F (36.4 C) 97.4 F (36.3 C)   TempSrc:  Oral Oral   Resp:  18 18   Height:      Weight:      SpO2: 93% 93% 95% 92%    Weight change:  Filed Weights   02/20/16 1956  Weight: 54 kg (119 lb 0.8 oz)   Body mass index is 20.42 kg/(m^2).   Gen Exam: Awake and alert Neck: Supple, No JVD.   Chest: B/L Clear.   CVS: S1 S2 Regular, no murmurs.  Abdomen: soft, BS +, non tender, non distended.  Extremities: No edema, lower extremities warm to touch Neurologic:   Non focal-almost 5/5 today  generalized weakness as well.  Skin: No Rash.   Wounds: N/A.    Intake/Output from previous day:  Intake/Output Summary (Last 24 hours) at 03/03/16 1432 Last data filed at 03/03/16 0700  Gross per 24 hour  Intake    840 ml  Output    750 ml  Net     90 ml     LAB RESULTS: CBC  Recent Labs Lab 02/27/16 0250  WBC 4.4  HGB 9.7*  HCT 30.5*  PLT 188  MCV 96.8  MCH 30.8  MCHC 31.8  RDW 21.7*    Chemistries   Recent Labs Lab 02/26/16 1137 02/27/16 0250 02/28/16 0522  NA 140 141 141  K 4.0 4.1 4.8  CL 111 107 107  CO2 GLUCOSE 132* 109* 95  BUN <5* <5* 6  CREATININE 0.55 0.55 0.57  CALCIUM 8.4* 8.7* 8.6*  MG 1.6* 1.6* 1.7    CBG: No results for input(s): GLUCAP in the last 168 hours.  GFR Estimated Creatinine Clearance: 68.5 mL/min (by C-G formula based on Cr of 0.57).  Coagulation profile No results for input(s): INR, PROTIME in the last 168 hours.  Cardiac Enzymes No results for input(s): CKMB, TROPONINI, MYOGLOBIN in the last 168 hours.  Invalid input(s): CK  Invalid input(s): POCBNP No results for input(s): DDIMER in the last 72 hours. No results for input(s): HGBA1C in the last 72 hours. No results for input(s): CHOL, HDL, LDLCALC, TRIG, CHOLHDL, LDLDIRECT in the last 72 hours. No results for input(s): TSH,  T4TOTAL, T3FREE, THYROIDAB in the last 72 hours.  Invalid input(s): FREET3 No results for input(s): VITAMINB12, FOLATE, FERRITIN, TIBC, IRON, RETICCTPCT in the last 72 hours. No results for input(s): LIPASE, AMYLASE in the last 72 hours.  Urine Studies No results for input(s): UHGB, CRYS in the last 72 hours.  Invalid input(s): UACOL, UAPR, USPG, UPH, UTP, UGL, UKET, UBIL, UNIT, UROB, ULEU, UEPI, UWBC, URBC, UBAC, CAST, UCOM, BILUA  MICROBIOLOGY: No results found for this or any previous visit (from the past 240 hour(s)).  RADIOLOGY STUDIES/RESULTS: Mr Thoracic Spine Wo Contrast  03/01/2016  CLINICAL DATA:  Initial evaluation for lower extremity weakness. EXAM: MRI THORACIC SPINE WITHOUT CONTRAST TECHNIQUE: Multiplanar, multisequence MR imaging of the thoracic spine was performed. No intravenous contrast was administered. COMPARISON:  None. FINDINGS: Study  fairly degraded by motion artifact. Vertebral bodies are normally aligned with preservation of the normal thoracic kyphosis. No listhesis. Mild chronic height loss at the superior endplate of T11. Vertebral body heights otherwise maintained. No fracture or malalignment. Signal intensity within the vertebral body bone marrow is normal. No focal osseous lesions. Minimal edema at the superior endplate of T2 seen associated with a small Schmorl's node. No other marrow edema. Signal intensity within the thoracic spinal cord is grossly normal. No acute paraspinous soft tissue abnormality. Visualized lungs are grossly clear. T2 hyperintense cyst noted within the partially visualized left kidney. Visualized visceral structures are otherwise unremarkable. No significant degenerative changes identified within the thoracic spine. No focal disc herniation. No evidence for cord compression or significant stenosis. IMPRESSION: 1. Motion degraded study. 2. Grossly normal MRI of the thoracic spine. No evidence for cord compression or significant stenosis to  explain bilateral lower extremity symptoms. No abnormal cord signal. Electronically Signed   By: Rise Mu M.D.   On: 03/01/2016 18:49   Mr Lumbar Spine W Wo Contrast  02/26/2016  CLINICAL DATA:  Profound bilateral lower extremity weakness, right greater than left. EXAM: MRI LUMBAR SPINE WITHOUT AND WITH CONTRAST TECHNIQUE: Multiplanar and multiecho pulse sequences of the lumbar spine were obtained without and with intravenous contrast. CONTRAST:  10 mL MultiHance COMPARISON:  CT abdomen and pelvis 12/18/2015. FINDINGS: Normal signal is present in the conus medullaris which terminates at L1. Bilateral L5 pars defects are present. Grade 1 anterolisthesis at L5-S1 is stable, measuring 6 mm. There is chronic fatty marrow infiltration of the L5 vertebral body and as well as the inferior endplate of L4 and superior endplate of S1. Marrow signal, vertebral body heights, and alignment are otherwise normal. Limited imaging of the abdomen is unremarkable. The disc levels at L2-3 and above are normal. L3-4: A mild broad-based disc protrusion is present. There is mild facet hypertrophy bilaterally. No significant stenosis is present. L4-5: A broad-based disc protrusion is present. There is mild facet hypertrophy bilaterally. Slight retrolisthesis is noted. The disc extends into the right neural foramen without significant stenosis. L5-S1: There is uncovering of a broad-based disc protrusion associated with the anterolisthesis. In combination with facet spurring this leads to moderate to severe foraminal narrowing bilaterally, left greater than right. IMPRESSION: 1. 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 2. Uncovering of a broad-based disc protrusion and facet spurring at L5-S1 leads to moderate to severe foraminal stenosis bilaterally, left greater than right. 3. Slight retrolisthesis and a rightward disc protrusion at L4-5 without significant stenosis. 4. Mild broad-based  disc protrusion and facet hypertrophy at L3-4 without significant stenosis. Electronically Signed   By: Marin Roberts M.D.   On: 02/26/2016 19:44   Dg Abd Acute W/chest  02/19/2016  CLINICAL DATA:  55 year old female with abdominal pain EXAM: DG ABDOMEN ACUTE W/ 1V CHEST COMPARISON:  The CT dated 12/18/2015 FINDINGS: The lungs are clear. No pleural effusion or pneumothorax. The cardiac silhouette is within normal limits. There is no evidence of bowel obstruction or free air. No radiopaque calculi identified. IMPRESSION: No radiopaque foreign object. There is degenerative changes of the osseous structures. No acute fracture. Electronically Signed   By: Elgie Collard M.D.   On: 02/19/2016 23:42    Jeoffrey Massed, MD  [facility] Pager:[phone]  If 7PM-7AM, please contact night-coverage [email or web address] Password [email or web address] 03/03/2016, 2:32 PM   LOS: 12 days

## 2016-03-03 NOTE — Progress Notes (Signed)
Speech Language Pathology Treatment: Dysphagia  Patient Details Name: Rhonda Mitchell MRN: [id number] DOB: 12/20/1961 Today's Date: 03/03/2016 Time: 1504-1364 SLP Time Calculation (min) (ACUTE ONLY): 9 min  Assessment / Plan / Recommendation Clinical Impression  SLP provided skilled observation during lunch meal with no overt s/s of aspiration observed. Verbal cue x1 for smaller bites as pt is impulsive with intake. Sitter at bedside reports good tolerance of breakfast meal. No further acute SLP needs identified, will s/o.   HPI HPI: 55 y.o. female with h/o COPD, anxiety, SOB, arthritis, GERD, alcohol abuse, hepatisis C, encephalopathy, MRSA infection and tobacco abuse, who presented to ED with c/o burning epigastric ABD pain, SOB, nausea, emesis and diarrhea. Pt drinks 3-4 pints of liquor daily. BSE 03/23/2015 recommended regular diet and thin liquids.      SLP Plan  All goals met     Recommendations  Diet recommendations: Regular;Thin liquid Liquids provided via: Cup;Straw Medication Administration: Whole meds with puree Supervision: Patient able to self feed;Full supervision/cueing for compensatory strategies Compensations: Minimize environmental distractions;Slow rate;Small sips/bites;Follow solids with liquid Postural Changes and/or Swallow Maneuvers: Seated upright 90 degrees             Oral Care Recommendations: Oral care BID Follow up Recommendations: None;24 hour supervision/assistance Plan: All goals met     GO               Germain Osgood, M.A. CCC-SLP [phone]  Germain Osgood 03/03/2016, 2:39 PM

## 2016-03-03 NOTE — Clinical Social Work Note (Signed)
Referrals made to the following letter of guarantee facilities:  [facility] - [facility] - [facility]ing [facility]   Roddie McBryant Yizel Canby MSW, [place] MtLCSW, [place] [facility], [phone]

## 2016-03-03 NOTE — Consult Note (Signed)
[facility]   Reason for Consult:  Capacity evaluation Referring Physician:  Dr. Jerral RalphGhimire Patient Identification: Rhonda GillesMichelle Mitchell MRN:  [id number] Principal Diagnosis: Alcohol withdrawal [facility](HCC) Diagnosis:   Patient Active Problem List   Diagnosis Date Noted  . Abnormality of gait [R26.9]   . Lower extremity weakness [R29.898]   . Pressure ulcer [L89.90] 02/26/2016  . Tachypnea [R06.82]   . Depression [F32.9]   . Hypernatremia [E87.0] 02/20/2016  . Anemia [D64.9] 02/20/2016  . UTI (lower urinary tract infection) [N39.0]   . Abdominal pain, vomiting, and diarrhea [R10.9, R11.10, R19.7] 12/18/2015  . Alcohol abuse [F10.10] 12/18/2015  . Abdominal pain [R10.9] 12/18/2015  . Elevated blood pressure [R03.0] 12/18/2015  . Severe recurrent major depression without psychotic features (HCC) [F33.2] 10/28/2015  . Alcohol dependence with uncomplicated withdrawal (HCC) [F10.230] 10/27/2015  . Alcohol-induced mood disorder (HCC) [F10.94] 10/27/2015  . Psychosis [F29] 08/21/2015  . Hypomagnesemia [E83.42]   . Protein-calorie malnutrition (HCC) [E46]   . Hepatitis C antibody test positive [R89.4]   . Elevated LFTs [R79.89]   . Hyperkalemia [E87.5]   . Severe protein-calorie malnutrition (HCC) [E43]   . Acute respiratory failure with hypoxia (HCC) [J96.01] 08/03/2015  . Septic olecranon bursitis of right elbow [M70.21, B96.89] 05/08/2015  . Septic bursitis of elbow [M71.129] 05/07/2015  . Thrombocytopenia (HCC) [D69.6] 05/07/2015  . Chronic hepatitis C without hepatic coma (HCC) [B18.2] 05/07/2015  . Hypokalemia [E87.6] 05/07/2015  . Abscess of bursa of elbow [M71.029] 05/07/2015  . COPD exacerbation (HCC) [J44.1] 03/18/2015  . Hyponatremia [E87.1] 03/18/2015  . Hyperglycemia [R73.9] 03/18/2015  . Alcohol dependence (HCC) [F10.20] 12/07/2014  . Alcohol withdrawal (HCC) [F10.239] 12/07/2014  . Protein-calorie malnutrition, severe (HCC) [E43] 12/07/2014  .  Musculoskeletal chest pain [R07.89] 12/07/2014  . Tobacco abuse [Z72.0] 12/07/2014  . Carbuncle and furuncle [L02.92, L02.93] 12/07/2014  . Macrocytic anemia [D53.9] 12/07/2014  . Chest pain [R07.9] 11/30/2014  . Tachycardia [R00.0] 11/30/2014  . COPD (chronic obstructive pulmonary disease) (HCC) [J44.9] 11/30/2014  . Cellulitis and abscess [L03.90, L02.91] 11/30/2014  . Atypical chest pain [R07.89]   . Dehydration [E86.0]     Total Time spent with patient: 45 minutes  Subjective:   Rhonda Mitchell is a 55 y.o. female patient admitted with alcohol abuse and depression.  HPI:  Rhonda Mitchell is a 55 y.o. female seen and case discussed with Hospitalist for the face-to-face psychiatry consultation and evaluation of capacity to make medical decisions and placement issues. Patient appeared sitting in chair next her bed. She is calm and cooperative during this evaluation. Patient is known to this pronator from her previous evaluation for substance abuse and depression.  patient has been able to communicate with her staff members, team and also fat the membranous R said the hospital on phone.  Patient has intact cognizance including orientation, memory, and a properly language functions. Patient denies current symptoms of depression, anxiety, psychosis and also denies active C7 specimens and ideation, and digital implants. Patient contract for safety and planning to go back to her home with her fianc who is a Naval architecttruck driver and currently out of town. Patient reportedly being involved with Alcoholics Anonymous groups and willing to participate in substance abuse treatment program for the rehabilitation when medically stable.  Past Psychiatric History: Patient is known to this provider from her previous encounters during past hospitalizations.  Risk to Self: Is patient at risk for suicide?: No, but patient needs Medical Clearance Risk to Others:   Prior Inpatient Therapy:  Prior Outpatient  Therapy:    Past Medical History:  Past Medical History  Diagnosis Date  . COPD (chronic obstructive pulmonary disease) (HCC)   . Anxiety   . Shortness of breath   . Arthritis   . GERD (gastroesophageal reflux disease)   . Full dentures   . Insomnia   . Alcohol abuse   . Hepatitis C   . Alcohol abuse   . History of MRSA infection   . History of encephalopathy     Past Surgical History  Procedure Laterality Date  . Arm surgery    . Hand surgery  1990    both rt/lt carpal tunnels  . Shoulder surgery      right and left-age [age]  . Orif ulnar / radial shaft fracture  1990    right-got infection-had total 10 surgeies 1990  . Orif ulnar fracture Left 05/23/2014    Procedure: OPEN REDUCTION INTERNAL FIXATION (ORIF) LEFT ULNAR FRACTURE;  Surgeon: Harvie Junior, MD;  Location: [facility];  Service: Orthopedics;  Laterality: Left;  . I&d extremity Right 05/07/2015    Procedure: IRRIGATION AND DEBRIDEMENT  ELBOW ;  Surgeon: Dominica Severin, MD;  Location: [facility] OR;  Service: Orthopedics;  Laterality: Right;   Family History:  Family History  Problem Relation Age of Onset  . Breast cancer Mother    Family Psychiatric  History: Unknown  Social History:  History  Alcohol Use  . Yes    Comment: 1/5th Vodka every day      History  Drug Use No    Comment: Hx: herion, cocaine 5 yrs ago    Social History   Social History  . Marital Status: Legally Separated    Spouse Name: N/A  . Number of Children: N/A  . Years of Education: N/A   Social History Main Topics  . Smoking status: Current Every Day Smoker -- 1.00 packs/day for 20 years    Types: Cigarettes  . Smokeless tobacco: Never Used  . Alcohol Use: Yes     Comment: 1/5th Vodka every day   . Drug Use: No     Comment: Hx: herion, cocaine 5 yrs ago  . Sexual Activity: Yes    Birth Control/ Protection: Post-menopausal   Other Topics Concern  . None   Social History Narrative   Additional Social History:     Allergies:  No Known Allergies  Labs:  Results for orders placed or performed during the hospital encounter of 02/19/16 (from the past 48 hour(s))  Urinalysis, Routine w reflex microscopic (not at [facility])     Status: None   Collection Time: 03/02/16  8:24 AM  Result Value Ref Range   Color, Urine YELLOW YELLOW   APPearance CLEAR CLEAR   Specific Gravity, Urine 1.008 1.005 - 1.030   pH 7.5 5.0 - 8.0   Glucose, UA NEGATIVE NEGATIVE mg/dL   Hgb urine dipstick NEGATIVE NEGATIVE   Bilirubin Urine NEGATIVE NEGATIVE   Ketones, ur NEGATIVE NEGATIVE mg/dL   Protein, ur NEGATIVE NEGATIVE mg/dL   Nitrite NEGATIVE NEGATIVE   Leukocytes, UA NEGATIVE NEGATIVE    Comment: MICROSCOPIC NOT DONE ON URINES WITH NEGATIVE PROTEIN, BLOOD, LEUKOCYTES, NITRITE, OR GLUCOSE <1000 mg/dL.    Current Facility-Administered Medications  Medication Dose Route Frequency Provider Last Rate Last Dose  . acetaminophen (TYLENOL) tablet 650 mg  650 mg Oral Q6H PRN Ron Parker, MD   650 mg at 02/27/16 0941   Or  . acetaminophen (TYLENOL) suppository 650  mg  650 mg Rectal Q6H PRN Ron Parker, MD   650 mg at 02/20/16 2354  . albuterol (PROVENTIL) (2.5 MG/3ML) 0.083% nebulizer solution 2.5 mg  2.5 mg Nebulization Q4H PRN Christiane Ha, MD   2.5 mg at 02/24/16 0923  . alum & mag hydroxide-simeth (MAALOX/MYLANTA) 200-200-20 MG/5ML suspension 30 mL  30 mL Oral Q6H PRN Ron Parker, MD      . benztropine (COGENTIN) tablet 0.5 mg  0.5 mg Oral BID Christiane Ha, MD   0.5 mg at 03/03/16 1000  . cloNIDine (CATAPRES) tablet 0.1 mg  0.1 mg Oral BID Christiane Ha, MD   0.1 mg at 03/03/16 0959  . DULoxetine (CYMBALTA) DR capsule 30 mg  30 mg Oral Daily Ron Parker, MD   30 mg at 03/03/16 0959  . enoxaparin (LOVENOX) injection 40 mg  40 mg Subcutaneous Q24H Lynita Lombard [place], RPH   40 mg at 03/01/16 1635  . gabapentin (NEURONTIN) capsule 100 mg  100 mg Oral TID Christiane Ha, MD   100 mg at  03/03/16 0959  . haloperidol (HALDOL) tablet 2 mg  2 mg Oral BID Christiane Ha, MD   2 mg at 03/03/16 0959  . haloperidol lactate (HALDOL) injection 2 mg  2 mg Intravenous Q6H PRN Maretta Bees, MD   2 mg at 03/03/16 1250  . LORazepam (ATIVAN) injection 1-2 mg  1-2 mg Intravenous Q4H PRN Maretta Bees, MD   2 mg at 03/03/16 1309  . mometasone-formoterol (DULERA) 200-5 MCG/ACT inhaler 2 puff  2 puff Inhalation BID Christiane Ha, MD   2 puff at 03/03/16 [phone]  . nicotine (NICODERM CQ - dosed in mg/24 hours) patch 21 mg  21 mg Transdermal Daily Rolan Lipa, NP   21 mg at 03/03/16 1000  . ondansetron (ZOFRAN) tablet 4 mg  4 mg Oral Q6H PRN Ron Parker, MD       Or  . ondansetron (ZOFRAN) injection 4 mg  4 mg Intravenous Q6H PRN Ron Parker, MD   4 mg at 02/28/16 2359  . oxyCODONE (Oxy IR/ROXICODONE) immediate release tablet 5 mg  5 mg Oral Q6H PRN Christiane Ha, MD   5 mg at 03/03/16 1250  . sodium chloride flush (NS) 0.9 % injection 10-40 mL  10-40 mL Intracatheter Q12H Christiane Ha, MD   10 mL at 03/03/16 1000  . sodium chloride flush (NS) 0.9 % injection 10-40 mL  10-40 mL Intracatheter PRN Christiane Ha, MD   10 mL at 03/02/16 0837  . traZODone (DESYREL) tablet 50 mg  50 mg Oral QHS PRN Christiane Ha, MD   50 mg at 03/03/16 0347    Musculoskeletal: Strength & Muscle Tone: decreased Gait & Station: unable to stand Patient leans: N/A  Psychiatric Specialty Exam: ROS  No Fever-chills, No Headache, No changes with Vision or hearing, reports vertigo No problems swallowing food or Liquids, No Chest pain, Cough or Shortness of Breath, No Abdominal pain, No Nausea or Vommitting, Bowel movements are regular, No Blood in stool or Urine, No dysuria, No new skin rashes or bruises, No new joints pains-aches,  No new weakness, tingling, numbness in any extremity, No recent weight gain or loss, No polyuria, polydypsia or  polyphagia,  A full 10 point Review of Systems was done, except as stated above, all other Review of Systems were negative.  Blood pressure 107/56, pulse 71, temperature 97.4 F (36.3 C),  temperature source Oral, resp. rate 18, height  (1.626 m), weight 54 kg (119 lb 0.8 oz), SpO2 92 %.Body mass index is 20.42 kg/(m^2).  General Appearance: Disheveled  Eye Contact::  Good  Speech:  Clear and Coherent and Slow  Volume:  Decreased  Mood:  Anxious and Depressed  Affect:  Appropriate and Congruent  Thought Process:  Coherent and Goal Directed  Orientation:  Full (Time, Place, and Person)  Thought Content:  WDL  Suicidal Thoughts:  No  Homicidal Thoughts:  No  Memory:  Immediate;   Good Recent;   Fair  Judgement:  Intact  Insight:  Fair  Psychomotor Activity:  Decreased  Concentration:  Fair  Recall:  Good  Fund of Knowledge:Good  Language: Good  Akathisia:  NA  Handed:  Right  AIMS (if indicated):     Assets:  Communication Skills Desire for Improvement Financial Resources/Insurance Housing Intimacy Leisure Time Resilience Social Support Transportation  ADL's:  Impaired  Cognition: WNL  Sleep:      Treatment Plan Summary: Patient has capacity to make her own medical decisions and placement issues based on my evaluation today  Continue Cymbalta, Neurontin, Haldol for depression, anxiety and agitation  Appreciate psychiatric consultation and sent off as patient has not required 500 services Please contact [phone] or [phone] if needs further assistance   Disposition: Patient may benefit from physical therapy when medically stable. Patient does not meet criteria for psychiatric inpatient admission. Supportive therapy provided about ongoing stressors.  Nehemiah Settle., MD 03/03/2016 3:28 PM

## 2016-03-03 NOTE — Care Management Note (Signed)
Case Management Note  Patient Details  Name: Sherrilee GillesMichelle Baugh MRN: [id number] Date of Birth: 1961-08-01  Subjective/Objective:                    Action/Plan:   Expected Discharge Date:                  Expected Discharge Plan:  Skilled Nursing Facility  In-House Referral:  Clinical Social Work  Discharge planning Services     Post Acute Care Choice:    Choice offered to:     DME Arranged:    DME Agency:     HH Arranged:    HH Agency:     Status of Service:  In process, will continue to follow  Medicare Important Message Given:    Date Medicare IM Given:    Medicare IM give by:    Date Additional Medicare IM Given:    Additional Medicare Important Message give by:     If discussed at Long Length of Stay Meetings, dates discussed:  03-03-16  Additional Comments:  Kingsley PlanWile, Azael Ragain Marie, RN 03/03/2016, 10:07 AM

## 2016-03-04 MED ORDER — LORAZEPAM 2 MG/ML IJ SOLN
1.0000 mg | Freq: Four times a day (QID) | INTRAMUSCULAR | Status: DC | PRN
  Administered 2016-03-05 – 2016-03-08 (×7): 1 mg via INTRAVENOUS
  Filled 2016-03-04 (×8): qty 1

## 2016-03-04 MED ORDER — HALOPERIDOL 1 MG PO TABS
2.0000 mg | ORAL_TABLET | Freq: Three times a day (TID) | ORAL | Status: DC
Start: 2016-03-04 — End: 2016-03-09
  Administered 2016-03-04 – 2016-03-09 (×15): 2 mg via ORAL
  Filled 2016-03-04 (×15): qty 2

## 2016-03-04 NOTE — Progress Notes (Signed)
Physical Therapy Treatment/Re-evaluation Patient Details Name: Rhonda Mitchell MRN: [id number] DOB: 1961-07-02 Today's Date: 03/04/2016    History of Present Illness Rhonda Mitchell is a 55 y.o. female with a history of ETOH Abuse, COPD, Hep C admitted with burning Epigastric ABD Pain and SOB, and Nausea Vomiting and Diarrhea.She drinks 3-4 pints of liquor daily and has done so for many years . Her Alcohol level was found at 298. She has a UTI and placed on ETOH withdrawal protocol    PT Comments    Goals reviewed and updated.  Pt has significant change in cognitive status today and is unable and unsafe to attempt OOB mobility.  We sat EOB and pt was reaching for non-existent objects in space. She was mumbling incoherently throughout session and was unable to participate in therapy.  PT will continue to follow acutely and recommend SNF at discharge as she is unsafe to go home in her current condition.    Follow Up Recommendations  SNF     Equipment Recommendations  Rolling walker with 5" wheels    Recommendations for Other Services   NA     Precautions / Restrictions Precautions Precautions: Fall Precaution Comments: significant fall risk    Mobility  Bed Mobility Overal bed mobility: Needs Assistance Bed Mobility: Supine to Sit;Sit to Supine     Supine to sit: Min assist;Mod assist Sit to supine: Min assist   General bed mobility comments: Up to mod assist to prevent pt from throwing herself too far forward in sitting EOB. Min assist to help progress legs back into bed when pt went back to supine.  Transfers                 General transfer comment: Not attempted, pt not safe to abulate today given worse cognitive status.          Balance Overall balance assessment: Needs assistance Sitting-balance support: Feet supported;Bilateral upper extremity supported Sitting balance-Leahy Scale: Poor Sitting balance - Comments: up to mod assist in sitting to  prevent pt from throwing herself forward in sitting.  At times reaching for unseen objects in space.                             Cognition Arousal/Alertness: Lethargic Behavior During Therapy: Restless;Agitated (easily agitated) Overall Cognitive Status: Impaired/Different from baseline Area of Impairment: Orientation;Attention;Memory;Following commands;Safety/judgement;Awareness;Problem solving Orientation Level: Disoriented to;Person;Place;Time;Situation Current Attention Level: Focused Memory: Decreased recall of precautions;Decreased short-term memory Following Commands: Follows one step commands inconsistently Safety/Judgement: Decreased awareness of safety;Decreased awareness of deficits Awareness: Intellectual Problem Solving: Slow processing;Decreased initiation;Difficulty sequencing;Requires verbal cues;Requires tactile cues General Comments: impulsive, at times very easily agitated, no real sensible or comprehensible sentances. Lethargic           Pertinent Vitals/Pain Pain Assessment: Faces Faces Pain Scale: No hurt           PT Goals (current goals can now be found in the care plan section) Acute Rehab PT Goals Patient Stated Goal: unable to state today PT Goal Formulation: Patient unable to participate in goal setting Time For Goal Achievement: 03/18/16 Potential to Achieve Goals: Fair Progress towards PT goals: Not progressing toward goals - comment (decrease cognitive stability today)    Frequency  Min 3X/week    PT Plan Current plan remains appropriate       End of Session Equipment Utilized During Treatment: Gait belt Activity Tolerance: Treatment limited secondary to agitation;Patient limited by lethargy  Patient left: in bed;with bed alarm set;with call bell/phone within reach     Time: 1458-1518 PT Time Calculation (min) (ACUTE ONLY): 20 min  Charges:    1 re-eval                     Sriya Kroeze B. Israel Wunder, PT, DPT [phone]    03/04/2016, 5:20 PM

## 2016-03-04 NOTE — Progress Notes (Signed)
PATIENT DETAILS Name: Rhonda Mitchell Age: 55 y.o. Sex: female Date of Birth: 1961/04/01 Admit Date: 02/19/2016 Admitting Physician Ron Parker, MD PCP:No primary care provider on file.  Subjective: Sedated this morning. Per RN-she was impulsive and wanted to get out of bed earlier this morning.  Assessment/Plan: Principal Problem: Alcohol withdrawal: Resolved, managed with Ativan per protocol. Now awake alert, not tremulous.  Active Problems: Lower extremity weakness: Exam seems to be inconsistent at times-initially did appear to have mild lower ext weakness Right >left-but seems to have improved over the past few days. Knee reflex are brisk.  MRI of LS spine and MRI thoracic spine negative for acute abnormalities. Note MRI LS spine was discussed with neurosurgery-Dr. Yetta Barre over the phone-he does not think that any of the mentioned changes can account for patient's symptoms.Neurology consulted-recommended MRI C Spine-however unable to complete x 2 even with IV Ativan. I doubt clinically that patient has C Spine abnormalities-and doubt utility of C spine. Awaiting repeat neurology evaluation. . TSH/vitamin B12 within normal limits.PT eval recommending SNF-however patient refusing SNF for rehab.Seen by psych today-has capacity-per patient she initially wanted to go home, but now seems to have changed her mind and wants to go to SNF.  COPD: Lungs clear, continue bronchodilators.  Hypokalemia/hypomagnesemia: Suspect secondary to GI loss and alcohol.    Anemia:  Secondary to chronic disease-no evidence of blood loss. Follow.  Thrombocytopenia: Suspect secondary to alcohol use, improving.  Escherichia coli UTI: Completed course of antibiotics.  History of chronic hepatitis C: Stable for outpatient follow-up.  History of depression/alcohol induced mood disorder:  Continue with Haldol, Cymbalta, Neurontin. Psychiatry consulted during this hospital stay-deemed to have  capacity to make decisions  Disposition: Remain inpatient-SNF or home with home health  Antimicrobial agents  See below  Anti-infectives    Start     Dose/Rate Route Frequency Ordered Stop   02/22/16 1800  sulfamethoxazole-trimethoprim (BACTRIM DS,SEPTRA DS) 800-160 MG per tablet 1 tablet  Status:  Discontinued     1 tablet Oral Every 12 hours 02/22/16 1000 02/24/16 0944   02/21/16 0300  cefTRIAXone (ROCEPHIN) 1 g in dextrose 5 % 50 mL IVPB  Status:  Discontinued     1 g 100 mL/hr over 30 Minutes Intravenous Every 24 hours 02/20/16 0436 02/22/16 0959   02/20/16 1030  metroNIDAZOLE (FLAGYL) IVPB 500 mg  Status:  Discontinued     500 mg 100 mL/hr over 60 Minutes Intravenous Every 8 hours 02/20/16 1030 02/21/16 1146   02/20/16 1000  vancomycin (VANCOCIN) 50 mg/mL oral solution 125 mg  Status:  Discontinued     125 mg Oral 4 times daily 02/20/16 0833 02/20/16 1030   02/20/16 0300  cefTRIAXone (ROCEPHIN) 1 g in dextrose 5 % 50 mL IVPB     1 g 100 mL/hr over 30 Minutes Intravenous  Once 02/20/16 0254 02/20/16 0459      DVT Prophylaxis: Lovenox  Code Status: Full code  Family Communication None at bedside  Procedures: None  CONSULTS:  Neurology  Time spent 25 minutes-Greater than 50% of this time was spent in counseling, explanation of diagnosis, planning of further management, and coordination of care.  MEDICATIONS: Scheduled Meds: . benztropine  0.5 mg Oral BID  . cloNIDine  0.1 mg Oral BID  . DULoxetine  30 mg Oral Daily  . enoxaparin (LOVENOX) injection  40 mg Subcutaneous Q24H  . gabapentin  100 mg Oral  TID  . haloperidol  2 mg Oral TID  . mometasone-formoterol  2 puff Inhalation BID  . nicotine  21 mg Transdermal Daily  . sodium chloride flush  10-40 mL Intracatheter Q12H   Continuous Infusions:  PRN Meds:.acetaminophen **OR** acetaminophen, albuterol, alum & mag hydroxide-simeth, LORazepam, ondansetron **OR** ondansetron (ZOFRAN) IV, oxyCODONE, sodium  chloride flush, traZODone    PHYSICAL EXAM: Vital signs in last 24 hours: Filed Vitals:   03/03/16 0833 03/03/16 1952 03/03/16 2000 03/04/16 0504  BP:   133/96 112/74  Pulse:   95 75  Temp:   98.8 F (37.1 C) 99 F (37.2 C)  TempSrc:   Oral Oral  Resp:   22 20  Height:      Weight:      SpO2: 92% 98% 93% 100%    Weight change:  Filed Weights   02/20/16 1956  Weight: 54 kg (119 lb 0.8 oz)   Body mass index is 20.42 kg/(m^2).   Gen Exam: sedated-but awake-mostly alert Neck: Supple, No JVD.   Chest: B/L Clear.   CVS: S1 S2 Regular, no murmurs.  Abdomen: soft, BS +, non tender, non distended.  Extremities: No edema, lower extremities warm to touch Neurologic:   Non focal-almost 5/5 today  generalized weakness as well.  Skin: No Rash.   Wounds: N/A.    Intake/Output from previous day:  Intake/Output Summary (Last 24 hours) at 03/04/16 1158 Last data filed at 03/04/16 0900  Gross per 24 hour  Intake    970 ml  Output      0 ml  Net    970 ml     LAB RESULTS: CBC  Recent Labs Lab 02/27/16 0250  WBC 4.4  HGB 9.7*  HCT 30.5*  PLT 188  MCV 96.8  MCH 30.8  MCHC 31.8  RDW 21.7*    Chemistries   Recent Labs Lab 02/27/16 0250 02/28/16 0522  NA 141 141  K 4.1 4.8  CL 107 107  CO2 24 27  GLUCOSE 109* 95  BUN <5* 6  CREATININE 0.55 0.57  CALCIUM 8.7* 8.6*  MG 1.6* 1.7    CBG: No results for input(s): GLUCAP in the last 168 hours.  GFR Estimated Creatinine Clearance: 68.5 mL/min (by C-G formula based on Cr of 0.57).  Coagulation profile No results for input(s): INR, PROTIME in the last 168 hours.  Cardiac Enzymes No results for input(s): CKMB, TROPONINI, MYOGLOBIN in the last 168 hours.  Invalid input(s): CK  Invalid input(s): POCBNP No results for input(s): DDIMER in the last 72 hours. No results for input(s): HGBA1C in the last 72 hours. No results for input(s): CHOL, HDL, LDLCALC, TRIG, CHOLHDL, LDLDIRECT in the last 72 hours. No  results for input(s): TSH, T4TOTAL, T3FREE, THYROIDAB in the last 72 hours.  Invalid input(s): FREET3 No results for input(s): VITAMINB12, FOLATE, FERRITIN, TIBC, IRON, RETICCTPCT in the last 72 hours. No results for input(s): LIPASE, AMYLASE in the last 72 hours.  Urine Studies No results for input(s): UHGB, CRYS in the last 72 hours.  Invalid input(s): UACOL, UAPR, USPG, UPH, UTP, UGL, UKET, UBIL, UNIT, UROB, ULEU, UEPI, UWBC, URBC, UBAC, CAST, UCOM, BILUA  MICROBIOLOGY: No results found for this or any previous visit (from the past 240 hour(s)).  RADIOLOGY STUDIES/RESULTS: Mr Thoracic Spine Wo Contrast  03/01/2016  CLINICAL DATA:  Initial evaluation for lower extremity weakness. EXAM: MRI THORACIC SPINE WITHOUT CONTRAST TECHNIQUE: Multiplanar, multisequence MR imaging of the thoracic spine was performed. No intravenous contrast  was administered. COMPARISON:  None. FINDINGS: Study fairly degraded by motion artifact. Vertebral bodies are normally aligned with preservation of the normal thoracic kyphosis. No listhesis. Mild chronic height loss at the superior endplate of T11. Vertebral body heights otherwise maintained. No fracture or malalignment. Signal intensity within the vertebral body bone marrow is normal. No focal osseous lesions. Minimal edema at the superior endplate of T2 seen associated with a small Schmorl's node. No other marrow edema. Signal intensity within the thoracic spinal cord is grossly normal. No acute paraspinous soft tissue abnormality. Visualized lungs are grossly clear. T2 hyperintense cyst noted within the partially visualized left kidney. Visualized visceral structures are otherwise unremarkable. No significant degenerative changes identified within the thoracic spine. No focal disc herniation. No evidence for cord compression or significant stenosis. IMPRESSION: 1. Motion degraded study. 2. Grossly normal MRI of the thoracic spine. No evidence for cord compression or  significant stenosis to explain bilateral lower extremity symptoms. No abnormal cord signal. Electronically Signed   By: Rise MuBenjamin  McClintock M.D.   On: 03/01/2016 18:49   Mr Lumbar Spine W Wo Contrast  02/26/2016  CLINICAL DATA:  Profound bilateral lower extremity weakness, right greater than left. EXAM: MRI LUMBAR SPINE WITHOUT AND WITH CONTRAST TECHNIQUE: Multiplanar and multiecho pulse sequences of the lumbar spine were obtained without and with intravenous contrast. CONTRAST:  10 mL MultiHance COMPARISON:  CT abdomen and pelvis 12/18/2015. FINDINGS: Normal signal is present in the conus medullaris which terminates at L1. Bilateral L5 pars defects are present. Grade 1 anterolisthesis at L5-S1 is stable, measuring 6 mm. There is chronic fatty marrow infiltration of the L5 vertebral body and as well as the inferior endplate of L4 and superior endplate of S1. Marrow signal, vertebral body heights, and alignment are otherwise normal. Limited imaging of the abdomen is unremarkable. The disc levels at L2-3 and above are normal. L3-4: A mild broad-based disc protrusion is present. There is mild facet hypertrophy bilaterally. No significant stenosis is present. L4-5: A broad-based disc protrusion is present. There is mild facet hypertrophy bilaterally. Slight retrolisthesis is noted. The disc extends into the right neural foramen without significant stenosis. L5-S1: There is uncovering of a broad-based disc protrusion associated with the anterolisthesis. In combination with facet spurring this leads to moderate to severe foraminal narrowing bilaterally, left greater than right. IMPRESSION: 1. 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 2. Uncovering of a broad-based disc protrusion and facet spurring at L5-S1 leads to moderate to severe foraminal stenosis bilaterally, left greater than right. 3. Slight retrolisthesis and a rightward disc protrusion at L4-5 without significant stenosis.  4. Mild broad-based disc protrusion and facet hypertrophy at L3-4 without significant stenosis. Electronically Signed   By: Marin Robertshristopher  Mattern M.D.   On: 02/26/2016 19:44   Dg Abd Acute W/chest  02/19/2016  CLINICAL DATA:  55 year old female with abdominal pain EXAM: DG ABDOMEN ACUTE W/ 1V CHEST COMPARISON:  The CT dated 12/18/2015 FINDINGS: The lungs are clear. No pleural effusion or pneumothorax. The cardiac silhouette is within normal limits. There is no evidence of bowel obstruction or free air. No radiopaque calculi identified. IMPRESSION: No radiopaque foreign object. There is degenerative changes of the osseous structures. No acute fracture. Electronically Signed   By: Elgie CollardArash  Radparvar M.D.   On: 02/19/2016 23:42    Jeoffrey MassedGHIMIRE,SHANKER, MD  [facility] Pager:[phone]  If 7PM-7AM, please contact night-coverage [email or web address] Password [email or web address] 03/04/2016, 11:58 AM   LOS: 13 days

## 2016-03-04 NOTE — Progress Notes (Addendum)
Patient placed in a specialty low bed with floor mats.  Sitter DC'd this morning. Patient fell out of bed, was found on mat, on the floor in fetal position. Patient suffered no injury, was stable and alert. Patient has been intermittently verbally abusive and combative today, she will swing and kick. She has been unable to care for herself today, requiring help with feeding, drinking, and dressing. She's keeping her eyes closed, following minimal commands, and does not seem fully oriented to what's going on at all.  She is completely unable to sit up, stand up, or support herself in any way; she was dead weight for 3 RNs to pick up off the floor to place back in her bed. MD notified of all behavior and events today.  6N Director, 6N ssistant Director, Case Management, and Charge RN aware as well.

## 2016-03-04 NOTE — Clinical Social Work Note (Signed)
CSW attempted to meet with patient this AM regarding SNF placement. CSW unable to communicate with the patient because of the medication she was given this AM. [facility] the patient a SNF bed but the patient will have to remain restraint/sitter free 24-48hrs before she can be admitted. CSW notes that the patient has been deemed to have capacity. Situation discussed with MD, unit director, and Wellsite geologistmedical director.

## 2016-03-05 DIAGNOSIS — F1024 Alcohol dependence with alcohol-induced mood disorder: Secondary | ICD-10-CM | POA: Diagnosis present

## 2016-03-05 MED ORDER — LORAZEPAM 1 MG PO TABS: 1.0000 mg | ORAL_TABLET | Freq: Three times a day (TID) | ORAL | Status: DC | PRN

## 2016-03-05 MED ORDER — HALOPERIDOL 2 MG PO TABS: 2.0000 mg | ORAL_TABLET | Freq: Two times a day (BID) | ORAL | Status: DC

## 2016-03-05 MED ORDER — TRAZODONE HCL 50 MG PO TABS: 50.0000 mg | ORAL_TABLET | Freq: Every day | ORAL | Status: DC

## 2016-03-05 MED ORDER — BUDESONIDE-FORMOTEROL FUMARATE 160-4.5 MCG/ACT IN AERO: 2.0000 | INHALATION_SPRAY | Freq: Two times a day (BID) | RESPIRATORY_TRACT | Status: DC

## 2016-03-05 MED ORDER — DULOXETINE HCL 30 MG PO CPEP: 30.0000 mg | ORAL_CAPSULE | Freq: Every day | ORAL | Status: DC

## 2016-03-05 MED ORDER — NICOTINE 21 MG/24HR TD PT24: 21.0000 mg | MEDICATED_PATCH | Freq: Every day | TRANSDERMAL | Status: DC

## 2016-03-05 MED ORDER — OXYCODONE HCL 5 MG PO TABS: 5.0000 mg | ORAL_TABLET | Freq: Four times a day (QID) | ORAL | Status: DC | PRN

## 2016-03-05 MED ORDER — BENZTROPINE MESYLATE 0.5 MG PO TABS: 0.5000 mg | ORAL_TABLET | Freq: Two times a day (BID) | ORAL | Status: DC

## 2016-03-05 MED ORDER — GABAPENTIN 100 MG PO CAPS
200.0000 mg | ORAL_CAPSULE | Freq: Three times a day (TID) | ORAL | Status: DC
  Administered 2016-03-05 – 2016-03-06 (×2): 200 mg via ORAL
  Filled 2016-03-05 (×2): qty 2

## 2016-03-05 MED ORDER — THIAMINE HCL 100 MG PO TABS: 100.0000 mg | ORAL_TABLET | Freq: Every day | ORAL | Status: DC

## 2016-03-05 MED ORDER — ALBUTEROL SULFATE HFA 108 (90 BASE) MCG/ACT IN AERS: 2.0000 | INHALATION_SPRAY | RESPIRATORY_TRACT | Status: DC | PRN

## 2016-03-05 MED ORDER — GABAPENTIN 100 MG PO CAPS: 100.0000 mg | ORAL_CAPSULE | Freq: Three times a day (TID) | ORAL | Status: DC

## 2016-03-05 NOTE — Clinical Social Work Note (Addendum)
Patient in need of ST rehab and does not have insurance. Primary CSW had began facility search and covering CSW made contact [facility] and they requested clinical information be sent through [facility]. Clinicals resent and CSW followed up with [facility] later same date and patient declined per Clara. Call made to nurse liaison Tammy [facility] and patient declined. [facility] [phone]) and [place] [facility]oline [facility] [phone] and patient declined. CSW will make contact with [facility] on Friday regarding patient.   Genelle BalVanessa Gissel Keilman, MSW, LCSW Licensed Clinical Social Worker Clinical Social Work Department [facility] [phone]

## 2016-03-05 NOTE — Progress Notes (Signed)
Pt. Woke up and requested night time medications.  Night medications given to pt. As requested.  Pt. Also very agitated trying to hit staff and get out of bed.  Pt. Redirected.  Prn Ativan 1mg  given for agitation.  Bed remains in low position and safety matt on floor.  Will continue to monitor pt. Closely.

## 2016-03-05 NOTE — Consult Note (Addendum)
[facility] Face[facility]to[facility]Face [facility] Consult Follow Up  Reason for Consult:  Agitation and alcohol abuse Referring Physician:  Dr. Jerral RalphGhimire Patient Identification: Rhonda Mitchell MRN:  [id number] Principal Diagnosis: Alcohol dependence with alcohol-induced mood disorder [facility](HCC) Diagnosis:   Patient Active Problem List   Diagnosis Date Noted  . Abnormality of gait [R26.9]   . Lower extremity weakness [R29.898]   . Pressure ulcer [L89.90] 02/26/2016  . Tachypnea [R06.82]   . Depression [F32.9]   . Hypernatremia [E87.0] 02/20/2016  . Anemia [D64.9] 02/20/2016  . UTI (lower urinary tract infection) [N39.0]   . Abdominal pain, vomiting, and diarrhea [R10.9, R11.10, R19.7] 12/18/2015  . Alcohol abuse [F10.10] 12/18/2015  . Abdominal pain [R10.9] 12/18/2015  . Elevated blood pressure [R03.0] 12/18/2015  . Severe recurrent major depression without psychotic features (HCC) [F33.2] 10/28/2015  . Alcohol dependence with uncomplicated withdrawal (HCC) [F10.230] 10/27/2015  . Alcohol-induced mood disorder (HCC) [F10.94] 10/27/2015  . Psychosis [F29] 08/21/2015  . Hypomagnesemia [E83.42]   . Protein-calorie malnutrition (HCC) [E46]   . Hepatitis C antibody test positive [R89.4]   . Elevated LFTs [R79.89]   . Hyperkalemia [E87.5]   . Severe protein-calorie malnutrition (HCC) [E43]   . Acute respiratory failure with hypoxia (HCC) [J96.01] 08/03/2015  . Septic olecranon bursitis of right elbow [M70.21, B96.89] 05/08/2015  . Septic bursitis of elbow [M71.129] 05/07/2015  . Thrombocytopenia (HCC) [D69.6] 05/07/2015  . Chronic hepatitis C without hepatic coma (HCC) [B18.2] 05/07/2015  . Hypokalemia [E87.6] 05/07/2015  . Abscess of bursa of elbow [M71.029] 05/07/2015  . COPD exacerbation (HCC) [J44.1] 03/18/2015  . Hyponatremia [E87.1] 03/18/2015  . Hyperglycemia [R73.9] 03/18/2015  . Alcohol dependence (HCC) [F10.20] 12/07/2014  . Alcohol withdrawal (HCC) [F10.239] 12/07/2014  . Protein-calorie  malnutrition, severe (HCC) [E43] 12/07/2014  . Musculoskeletal chest pain [R07.89] 12/07/2014  . Tobacco abuse [Z72.0] 12/07/2014  . Carbuncle and furuncle [L02.92, L02.93] 12/07/2014  . Macrocytic anemia [D53.9] 12/07/2014  . Chest pain [R07.9] 11/30/2014  . Tachycardia [R00.0] 11/30/2014  . COPD (chronic obstructive pulmonary disease) (HCC) [J44.9] 11/30/2014  . Cellulitis and abscess [L03.90, L02.91] 11/30/2014  . Atypical chest pain [R07.89]   . Dehydration [E86.0]     Total Time spent with patient: 45 minutes  Subjective:   Rhonda Mitchell is a 55 y.o. female patient admitted with alcohol abuse and depression.  HPI:  Rhonda Mitchell is a 55 y.o. female seen and case discussed with Hospitalist for the face-to-face psychiatry consultation and evaluation of capacity to make medical decisions and placement issues. Patient appeared sitting in chair next her bed. She is calm and cooperative during this evaluation. Patient is known to this pronator from her previous evaluation for substance abuse and depression.  patient has been able to communicate with her staff members, team and also fat the membranous R said the hospital on phone. Patient has intact cognizance including orientation, memory, and a properly language functions. Patient denies current symptoms of depression, anxiety, psychosis and also denies active C7 specimens and ideation, and digital implants. Patient contract for safety and planning to go back to her home with her fianc who is a Naval architecttruck driver and currently out of town. Patient reportedly being involved with Alcoholics Anonymous groups and willing to participate in substance abuse treatment program for the rehabilitation when medically stable. Past Psychiatric History: Patient is known to this provider from her previous encounters during past hospitalizations.  Interval History: Patient seen today for psychiatric evaluation of depression, anxiety, irritability, and agitation.  Patient appeared calm and cooperative  during my evaluation and stated she has problems walking with her niece and requesting pain medication. Patient has been working with the physical therapy. Patient reported she gets angry when people does not listen her and put restrictions to her. Patient also reported her fianc is coming back from his trip tomorrow. Patient is hoping to go home soon. Patient is also reported she has a plan of participate in Alcoholics Anonymous group but not interested in residential treatment programs for alcohol dependence. Patient has no episodes of alcohol withdrawal syndrome at this time. Patient denies active suicidal/homicidal ideation, intention or plans. Patient also has a plan to participate in outpatient psychiatric treatment at [facility] when discharged back to community.  Risk to Self: Is patient at risk for suicide?: No, but patient needs Medical Clearance Risk to Others:   Prior Inpatient Therapy:   Prior Outpatient Therapy:    Past Medical History:  Past Medical History  Diagnosis Date  . COPD (chronic obstructive pulmonary disease) (HCC)   . Anxiety   . Shortness of breath   . Arthritis   . GERD (gastroesophageal reflux disease)   . Full dentures   . Insomnia   . Alcohol abuse   . Hepatitis C   . Alcohol abuse   . History of MRSA infection   . History of encephalopathy     Past Surgical History  Procedure Laterality Date  . Arm surgery    . Hand surgery  1990    both rt/lt carpal tunnels  . Shoulder surgery      right and left-age [age]  . Orif ulnar / radial shaft fracture  1990    right-got infection-had total 10 surgeies 1990  . Orif ulnar fracture Left 05/23/2014    Procedure: OPEN REDUCTION INTERNAL FIXATION (ORIF) LEFT ULNAR FRACTURE;  Surgeon: Harvie Junior, MD;  Location: [facility];  Service: Orthopedics;  Laterality: Left;  . I&d extremity Right 05/07/2015    Procedure: IRRIGATION AND DEBRIDEMENT  ELBOW ;  Surgeon:  Dominica Severin, MD;  Location: [facility] OR;  Service: Orthopedics;  Laterality: Right;   Family History:  Family History  Problem Relation Age of Onset  . Breast cancer Mother    Family Psychiatric  History: Unknown  Social History:  History  Alcohol Use  . Yes    Comment: 1/5th Vodka every day      History  Drug Use No    Comment: Hx: herion, cocaine 5 yrs ago    Social History   Social History  . Marital Status: Legally Separated    Spouse Name: N/A  . Number of Children: N/A  . Years of Education: N/A   Social History Main Topics  . Smoking status: Current Every Day Smoker -- 1.00 packs/day for 20 years    Types: Cigarettes  . Smokeless tobacco: Never Used  . Alcohol Use: Yes     Comment: 1/5th Vodka every day   . Drug Use: No     Comment: Hx: herion, cocaine 5 yrs ago  . Sexual Activity: Yes    Birth Control/ Protection: Post-menopausal   Other Topics Concern  . None   Social History Narrative   Additional Social History:    Allergies:  No Known Allergies  Labs:  No results found for this or any previous visit (from the past 48 hour(s)).  Current Facility-Administered Medications  Medication Dose Route Frequency Provider Last Rate Last Dose  . acetaminophen (TYLENOL) tablet 650 mg  650 mg Oral Q6H  PRN Ron Parker, MD   650 mg at 02/27/16 0941   Or  . acetaminophen (TYLENOL) suppository 650 mg  650 mg Rectal Q6H PRN Ron Parker, MD   650 mg at 02/20/16 2354  . albuterol (PROVENTIL) (2.5 MG/3ML) 0.083% nebulizer solution 2.5 mg  2.5 mg Nebulization Q4H PRN Christiane Ha, MD   2.5 mg at 02/24/16 0923  . alum & mag hydroxide-simeth (MAALOX/MYLANTA) 200-200-20 MG/5ML suspension 30 mL  30 mL Oral Q6H PRN Ron Parker, MD      . benztropine (COGENTIN) tablet 0.5 mg  0.5 mg Oral BID Christiane Ha, MD   0.5 mg at 03/05/16 1056  . DULoxetine (CYMBALTA) DR capsule 30 mg  30 mg Oral Daily Ron Parker, MD   30 mg at 03/05/16 1056  .  enoxaparin (LOVENOX) injection 40 mg  40 mg Subcutaneous Q24H Lynita [place], RPH   40 mg at 03/04/16 1610  . gabapentin (NEURONTIN) capsule 100 mg  100 mg Oral TID Christiane Ha, MD   100 mg at 03/05/16 1056  . haloperidol (HALDOL) tablet 2 mg  2 mg Oral TID Maretta Bees, MD   2 mg at 03/05/16 1056  . LORazepam (ATIVAN) injection 1 mg  1 mg Intravenous Q6H PRN Maretta Bees, MD   1 mg at 03/05/16 0721  . mometasone-formoterol (DULERA) 200-5 MCG/ACT inhaler 2 puff  2 puff Inhalation BID Christiane Ha, MD   2 puff at 03/05/16 0913  . nicotine (NICODERM CQ - dosed in mg/24 hours) patch 21 mg  21 mg Transdermal Daily Rolan Lipa, NP   21 mg at 03/05/16 1057  . ondansetron (ZOFRAN) tablet 4 mg  4 mg Oral Q6H PRN Ron Parker, MD       Or  . ondansetron (ZOFRAN) injection 4 mg  4 mg Intravenous Q6H PRN Ron Parker, MD   4 mg at 02/28/16 2359  . oxyCODONE (Oxy IR/ROXICODONE) immediate release tablet 5 mg  5 mg Oral Q6H PRN Christiane Ha, MD   5 mg at 03/05/16 1427  . sodium chloride flush (NS) 0.9 % injection 10-40 mL  10-40 mL Intracatheter Q12H Christiane Ha, MD   20 mL at 03/04/16 1138  . sodium chloride flush (NS) 0.9 % injection 10-40 mL  10-40 mL Intracatheter PRN Christiane Ha, MD   10 mL at 03/04/16 1746  . traZODone (DESYREL) tablet 50 mg  50 mg Oral QHS PRN Christiane Ha, MD   50 mg at 03/03/16 2017    Musculoskeletal: Strength & Muscle Tone: decreased Gait & Station: unable to stand Patient leans: N/A  Psychiatric Specialty Exam: ROS   Blood pressure 114/74, pulse 74, temperature 97.8 F (36.6 C), temperature source Oral, resp. rate 18, height 5\' 4"  (1.626 m), weight 54 kg (119 lb 0.8 oz), SpO2 96 %.Body mass index is 20.42 kg/(m^2).  General Appearance: Disheveled  Eye Contact::  Good  Speech:  Clear and Coherent and Slow  Volume:  Decreased  Mood:  Anxious and Depressed  Affect:  Appropriate and Congruent  Thought  Process:  Coherent and Goal Directed  Orientation:  Full (Time, Place, and Person)  Thought Content:  WDL  Suicidal Thoughts:  No  Homicidal Thoughts:  No  Memory:  Immediate;   Good Recent;   Fair  Judgement:  Intact  Insight:  Fair  Psychomotor Activity:  Decreased  Concentration:  Fair  Recall:  Good  Fund of Knowledge:Good  Language: Good  Akathisia:  NA  Handed:  Right  AIMS (if indicated):     Assets:  Communication Skills Desire for Improvement Financial Resources/Insurance Housing Intimacy Leisure Time Resilience Social Support Transportation  ADL's:  Impaired  Cognition: WNL  Sleep:      Treatment Plan Summary:  Case discussed with Dr. Jerral Ralph regarding her increased agitation and non compliant behavior with staff which causing unsafe to patient and high risk for falls Agree to increase her neurontin to 300 mg PO TID and start clonazepam 1 mg PO BID for increased anxiety and agitation and also appropriate restraints for patient safety and protection.    Patient has capacity to make her own medical decisions and placement issues Continue Cymbalta 30 mg PO QD for depression Continue Haldol 2 mg PO TID for agitationAnd psychosis Continue Trazodone 50 mg PO Qhs of insomnia  Patient has pain medication ordered as needed Appreciate psychiatric consultation and sent off as today Please contact [phone] or [phone] if needs further assistance   Disposition: Patient may benefit from physical therapy when medically stable. Patient does not meet criteria for psychiatric inpatient admission. Supportive therapy provided about ongoing stressors.  Nehemiah Settle., MD 03/05/2016 2:57 PM

## 2016-03-05 NOTE — Progress Notes (Signed)
Is much more awake and alert compared to yesterday-is now agreeable to go SNF. Note-this MD had spoken with Neuro MD-Dr Celine MansNandigam yesterday-who had evaluated the patient-and did not feel that patient needed a MRI C spine. He had no further recommendations and had signed off. Await SW evaluation.

## 2016-03-05 NOTE — Discharge Summary (Addendum)
PATIENT DETAILS Name: Rhonda GillesMichelle Mitchell Age: 55 y.o. Sex: female Date of Birth: 1961/04/19 MRN: [id number]. Admitting Physician: Ron ParkerHarvette C Jenkins, MD PCP:No primary care provider on file.  Admit Date: 02/19/2016 Discharge date: 03/09/2016  Recommendations for Outpatient Follow-up:  1. Consider outpatient referral to neurosurgery for evaluation of chronic degenerative changes seen on lumbosacral spine. 2. Has chronic hepatitis C-consider referral to infectious disease clinic as outpatient. 3. Will need ongoing counseling regarding importance of follow-up, counseling regarding compliance to medications. 4. Will need ongoing counseling regarding avoidance of alcohol  5. Please repeat CBC/BMET at next visit  PRIMARY DISCHARGE DIAGNOSIS:  Principal Problem:   Alcohol dependence with alcohol-induced mood disorder (HCC) Active Problems:   COPD (chronic obstructive pulmonary disease) (HCC)   Dehydration   Alcohol withdrawal (HCC)   Chronic hepatitis C without hepatic coma (HCC)   Abdominal pain, vomiting, and diarrhea   Alcohol abuse   Hypernatremia   Anemia   Pressure ulcer   Tachypnea   Depression   Lower extremity weakness   Abnormality of gait      PAST MEDICAL HISTORY: Past Medical History  Diagnosis Date  . COPD (chronic obstructive pulmonary disease) (HCC)   . Anxiety   . Shortness of breath   . Arthritis   . GERD (gastroesophageal reflux disease)   . Full dentures   . Insomnia   . Alcohol abuse   . Hepatitis C   . Alcohol abuse   . History of MRSA infection   . History of encephalopathy     DISCHARGE MEDICATIONS: Current Discharge Medication List    START taking these medications   Details  clonazePAM (KLONOPIN) 0.5 MG tablet Take 2 tablets (1 mg total) by mouth at bedtime. Qty: 20 tablet, Refills: 0    oxyCODONE (OXY IR/ROXICODONE) 5 MG immediate release tablet Take 1 tablet (5 mg total) by mouth every 6 (six) hours as needed for moderate pain. Qty: 30  tablet, Refills: 0      CONTINUE these medications which have CHANGED   Details  albuterol (PROVENTIL HFA;VENTOLIN HFA) 108 (90 Base) MCG/ACT inhaler Inhale 2 puffs into the lungs every 4 (four) hours as needed for wheezing or shortness of breath. Qty: 1 Inhaler, Refills: 0    benztropine (COGENTIN) 0.5 MG tablet Take 1 tablet (0.5 mg total) by mouth 2 (two) times daily. Qty: 60 tablet, Refills: 0    budesonide-formoterol (SYMBICORT) 160-4.5 MCG/ACT inhaler Inhale 2 puffs into the lungs 2 (two) times daily. Reported on 02/20/2016 Qty: 1 Inhaler, Refills: 0    DULoxetine (CYMBALTA) 30 MG capsule Take 1 capsule (30 mg total) by mouth daily. Qty: 30 capsule, Refills: 0    gabapentin (NEURONTIN) 300 MG capsule Take 1 capsule (300 mg total) by mouth 3 (three) times daily. Qty: 90 capsule, Refills: 0    haloperidol (HALDOL) 2 MG tablet Take 1 tablet (2 mg total) by mouth 3 (three) times daily. Qty: 90 tablet, Refills: 0    nicotine (NICODERM CQ - DOSED IN MG/24 HOURS) 21 mg/24hr patch Place 1 patch (21 mg total) onto the skin daily. Qty: 28 patch, Refills: 0    thiamine 100 MG tablet Take 1 tablet (100 mg total) by mouth daily. Qty: 30 tablet, Refills: 0    traZODone (DESYREL) 50 MG tablet Take 1 tablet (50 mg total) by mouth at bedtime. Qty: 30 tablet, Refills: 0      STOP taking these medications     ibuprofen (ADVIL,MOTRIN) 200 MG tablet  vancomycin (VANCOCIN) 50 mg/mL oral solution         ALLERGIES:  No Known Allergies  BRIEF HPI:  See H&P, Labs, Consult and Test reports for all details in brief, patient is a 55 year old female with long-standing history of alcohol abuse, depression/anxiety presented to the hospital for abdominal pain, hospital course has been prolonged and complicated by alcohol withdrawal delirium tremens.  CONSULTATIONS:   neurology and psychiatry  PERTINENT RADIOLOGIC STUDIES: Mr Thoracic Spine Wo Contrast  03/01/2016  CLINICAL DATA:  Initial  evaluation for lower extremity weakness. EXAM: MRI THORACIC SPINE WITHOUT CONTRAST TECHNIQUE: Multiplanar, multisequence MR imaging of the thoracic spine was performed. No intravenous contrast was administered. COMPARISON:  None. FINDINGS: Study fairly degraded by motion artifact. Vertebral bodies are normally aligned with preservation of the normal thoracic kyphosis. No listhesis. Mild chronic height loss at the superior endplate of T11. Vertebral body heights otherwise maintained. No fracture or malalignment. Signal intensity within the vertebral body bone marrow is normal. No focal osseous lesions. Minimal edema at the superior endplate of T2 seen associated with a small Schmorl's node. No other marrow edema. Signal intensity within the thoracic spinal cord is grossly normal. No acute paraspinous soft tissue abnormality. Visualized lungs are grossly clear. T2 hyperintense cyst noted within the partially visualized left kidney. Visualized visceral structures are otherwise unremarkable. No significant degenerative changes identified within the thoracic spine. No focal disc herniation. No evidence for cord compression or significant stenosis. IMPRESSION: 1. Motion degraded study. 2. Grossly normal MRI of the thoracic spine. No evidence for cord compression or significant stenosis to explain bilateral lower extremity symptoms. No abnormal cord signal. Electronically Signed   By: Rise Mu M.D.   On: 03/01/2016 18:49   Mr Lumbar Spine W Wo Contrast  02/26/2016  CLINICAL DATA:  Profound bilateral lower extremity weakness, right greater than left. EXAM: MRI LUMBAR SPINE WITHOUT AND WITH CONTRAST TECHNIQUE: Multiplanar and multiecho pulse sequences of the lumbar spine were obtained without and with intravenous contrast. CONTRAST:  10 mL MultiHance COMPARISON:  CT abdomen and pelvis 12/18/2015. FINDINGS: Normal signal is present in the conus medullaris which terminates at L1. Bilateral L5 pars defects are  present. Grade 1 anterolisthesis at L5-S1 is stable, measuring 6 mm. There is chronic fatty marrow infiltration of the L5 vertebral body and as well as the inferior endplate of L4 and superior endplate of S1. Marrow signal, vertebral body heights, and alignment are otherwise normal. Limited imaging of the abdomen is unremarkable. The disc levels at L2-3 and above are normal. L3-4: A mild broad-based disc protrusion is present. There is mild facet hypertrophy bilaterally. No significant stenosis is present. L4-5: A broad-based disc protrusion is present. There is mild facet hypertrophy bilaterally. Slight retrolisthesis is noted. The disc extends into the right neural foramen without significant stenosis. L5-S1: There is uncovering of a broad-based disc protrusion associated with the anterolisthesis. In combination with facet spurring this leads to moderate to severe foraminal narrowing bilaterally, left greater than right. IMPRESSION: 1. 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 2. Uncovering of a broad-based disc protrusion and facet spurring at L5-S1 leads to moderate to severe foraminal stenosis bilaterally, left greater than right. 3. Slight retrolisthesis and a rightward disc protrusion at L4-5 without significant stenosis. 4. Mild broad-based disc protrusion and facet hypertrophy at L3-4 without significant stenosis. Electronically Signed   By: Marin Roberts M.D.   On: 02/26/2016 19:44   Dg Abd Acute W/chest  02/19/2016  CLINICAL DATA:  55 year old female with abdominal pain EXAM: DG ABDOMEN ACUTE W/ 1V CHEST COMPARISON:  The CT dated 12/18/2015 FINDINGS: The lungs are clear. No pleural effusion or pneumothorax. The cardiac silhouette is within normal limits. There is no evidence of bowel obstruction or free air. No radiopaque calculi identified. IMPRESSION: No radiopaque foreign object. There is degenerative changes of the osseous structures. No acute fracture.  Electronically Signed   By: Elgie Collard M.D.   On: 02/19/2016 23:42     PERTINENT LAB RESULTS: CBC: No results for input(s): WBC, HGB, HCT, PLT in the last 72 hours. CMET CMP     Component Value Date/Time   NA 141 02/28/2016 0522   K 4.8 02/28/2016 0522   CL 107 02/28/2016 0522   CO2 27 02/28/2016 0522   GLUCOSE 95 02/28/2016 0522   BUN 6 02/28/2016 0522   CREATININE 0.57 02/28/2016 0522   CALCIUM 8.6* 02/28/2016 0522   PROT 6.7 02/19/2016 2106   ALBUMIN 3.2* 02/19/2016 2106   AST 183* 02/19/2016 2106   ALT 46 02/19/2016 2106   ALKPHOS 70 02/19/2016 2106   BILITOT 0.4 02/19/2016 2106   GFRNONAA >60 02/28/2016 0522   GFRAA >60 02/28/2016 0522    GFR Estimated Creatinine Clearance: 68.5 mL/min (by C-G formula based on Cr of 0.57). No results for input(s): LIPASE, AMYLASE in the last 72 hours. No results for input(s): CKTOTAL, CKMB, CKMBINDEX, TROPONINI in the last 72 hours. Invalid input(s): POCBNP No results for input(s): DDIMER in the last 72 hours. No results for input(s): HGBA1C in the last 72 hours. No results for input(s): CHOL, HDL, LDLCALC, TRIG, CHOLHDL, LDLDIRECT in the last 72 hours. No results for input(s): TSH, T4TOTAL, T3FREE, THYROIDAB in the last 72 hours.  Invalid input(s): FREET3 No results for input(s): VITAMINB12, FOLATE, FERRITIN, TIBC, IRON, RETICCTPCT in the last 72 hours. Coags: No results for input(s): INR in the last 72 hours.  Invalid input(s): PT Microbiology: No results found for this or any previous visit (from the past 240 hour(s)).   BRIEF HOSPITAL COURSE:   Alcohol withdrawal: Resolved, managed with Ativan per protocol. Now awake alert, not tremulous. Walking up and down the hallway with a walker by herself.    Lower extremity weakness: Exam seems to be inconsistent at times-initially did appear to have mild lower ext weakness- Right >left-but seems to have improved over the past few days. Knee reflex are brisk.Current  strength of lower extremities around 5/5 on day of discharge. MRI of LS spine and MRI thoracic spine negative for acute abnormalities. Note MRI LS spine was discussed with neurosurgery-Dr. Yetta Barre over the phone-he does not think that any of the mentioned changes can account for patient's symptoms.Neurology consulted-recommended MRI C Spine-however unable to complete x 2 even with IV Ativan. I doubt clinically that patient has C Spine abnormalities-and doubt utility of C spine. Subsequently seen by neurology-Dr.Nandigam on 3/15, per his evaluation, no need to proceed with C-spine MRI. He had no further recommendation at signed off.   Having said that patient is now ambulating in the hallway with a walker, making 6-7 laps each day of the unit, exam remains nonfocal, will have another PT eval and most likely discharge home with home health at this time, walker will be provided, if she remains qualified for sniff then she will go to SNF. Outpatient follow-up with neurosurgery and neurology.   COPD: Lungs clear, continue bronchodilators.  Hypokalemia/hypomagnesemia: Suspect secondary to GI loss and alcohol. Please  follow electrolytes periodically while at SNF or by PCP in 1 week.  Anemia: Secondary to chronic disease-no evidence of blood loss. Follow CBC periodically while at SNF  Thrombocytopenia: Suspect secondary to alcohol use, resolved.  Escherichia coli UTI: Completed course of antibiotics.  History of chronic hepatitis C: Stable for outpatient follow-up. Please ensure follow-up with infectious disease clinic  History of depression/alcohol induced mood disorder: Continue with Haldol, Cymbalta, Neurontin. Psychiatry consulted during this hospital stay-deemed to have capacity to make decisions. Does have fluctuations of hormone-at times can be very impulsive and agitated, but is easily redirectable. His morning she is alert awake oriented 3 and bleeding in the hallway with a walker.   TODAY-DAY  OF DISCHARGE:  Subjective:   Rhonda Mitchell today has no headache,no chest abdominal pain,no new weakness tingling or numbness. She is much more awake and alert, feels better.  Objective:   Blood pressure 114/95, pulse 77, temperature 97.8 F (36.6 C), temperature source Oral, resp. rate 20, height 5\' 4"  (1.626 m), weight 54 kg (119 lb 0.8 oz), SpO2 94 %.  Intake/Output Summary (Last 24 hours) at 03/09/16 1024 Last data filed at 03/08/16 2300  Gross per 24 hour  Intake    480 ml  Output      0 ml  Net    480 ml   Filed Weights   02/20/16 1956  Weight: 54 kg (119 lb 0.8 oz)    Exam Awake Alert, Oriented *3, No new F.N deficits, Normal affect [place].AT,PERRAL Supple Neck,No JVD, No cervical lymphadenopathy appriciated.  Symmetrical Chest wall movement, Good air movement bilaterally, CTAB RRR,No Gallops,Rubs or new Murmurs, No Parasternal Heave +ve B.Sounds, Abd Soft, Non tender, No organomegaly appriciated, No rebound -guarding or rigidity. No Cyanosis, Clubbing or edema, No new Rash or bruise  DISCHARGE CONDITION: Stable  DISPOSITION: Home health PT/SNF  DISCHARGE INSTRUCTIONS:    Activity:  As tolerated with Full fall precautions use walker/cane & assistance as needed  Get Medicines reviewed and adjusted: Please take all your medications with you for your next visit with your Primary MD  Please request your Primary MD to go over all hospital tests and procedure/radiological results at the follow up, please ask your Primary MD to get all Hospital records sent to his/her office.  If you experience worsening of your admission symptoms, develop shortness of breath, life threatening emergency, suicidal or homicidal thoughts you must seek medical attention immediately by calling 911 or calling your MD immediately  if symptoms less severe.  You must read complete instructions/literature along with all the possible adverse reactions/side effects for all the Medicines you take  and that have been prescribed to you. Take any new Medicines after you have completely understood and accpet all the possible adverse reactions/side effects.   Do not drive when taking Pain medications.   Do not take more than prescribed Pain, Sleep and Anxiety Medications  Special Instructions: If you have smoked or chewed Tobacco  in the last 2 yrs please stop smoking, stop any regular Alcohol  and or any Recreational drug use.  Wear Seat belts while driving.  Please note  You were cared for by a hospitalist during your hospital stay. Once you are discharged, your primary care physician will handle any further medical issues. Please note that NO REFILLS for any discharge medications will be authorized once you are discharged, as it is imperative that you return to your primary care physician (or establish a relationship with a primary care physician if  you do not have one) for your aftercare needs so that they can reassess your need for medications and monitor your lab values.   Diet recommendation: General diet  Discharge Instructions    Call MD for:  difficulty breathing, headache or visual disturbances    Complete by:  As directed      Call MD for:  persistant dizziness or light-headedness    Complete by:  As directed      Call MD for:  persistant nausea and vomiting    Complete by:  As directed      Call MD for:  severe uncontrolled pain    Complete by:  As directed      Diet - low sodium heart healthy    Complete by:  As directed      Discharge instructions    Complete by:  As directed   Follow with Primary MD  in 7 days   Get CBC, CMP, 2 view Chest X ray checked  by Primary MD next visit.    Activity: As tolerated with Full fall precautions use walker/cane & assistance as needed   Disposition SNF vs HHPT   Diet:   Heart Healthy    For Heart failure patients - Check your Weight same time everyday, if you gain over 2 pounds, or you develop in leg swelling, experience  more shortness of breath or chest pain, call your Primary MD immediately. Follow Cardiac Low Salt Diet and 1.5 lit/day fluid restriction.   On your next visit with your primary care physician please Get Medicines reviewed and adjusted.   Please request your Prim.MD to go over all Hospital Tests and Procedure/Radiological results at the follow up, please get all Hospital records sent to your Prim MD by signing hospital release before you go home.   If you experience worsening of your admission symptoms, develop shortness of breath, life threatening emergency, suicidal or homicidal thoughts you must seek medical attention immediately by calling 911 or calling your MD immediately  if symptoms less severe.  You Must read complete instructions/literature along with all the possible adverse reactions/side effects for all the Medicines you take and that have been prescribed to you. Take any new Medicines after you have completely understood and accpet all the possible adverse reactions/side effects.   Do not drive, operating heavy machinery, perform activities at heights, swimming or participation in water activities or provide baby sitting services if your were admitted for syncope or siezures until you have seen by Primary MD or a Neurologist and advised to do so again.  Do not drive when taking Pain medications.    Do not take more than prescribed Pain, Sleep and Anxiety Medications  Special Instructions: If you have smoked or chewed Tobacco  in the last 2 yrs please stop smoking, stop any regular Alcohol  and or any Recreational drug use.  Wear Seat belts while driving.   Please note  You were cared for by a hospitalist during your hospital stay. If you have any questions about your discharge medications or the care you received while you were in the hospital after you are discharged, you can call the unit and asked to speak with the hospitalist on call if the hospitalist that took care of  you is not available. Once you are discharged, your primary care physician will handle any further medical issues. Please note that NO REFILLS for any discharge medications will be authorized once you are discharged, as it is imperative that you  return to your primary care physician (or establish a relationship with a primary care physician if you do not have one) for your aftercare needs so that they can reassess your need for medications and monitor your lab values.     Increase activity slowly    Complete by:  As directed            Follow-up Information    Schedule an appointment as soon as possible for a visit in 1 week to follow up.   Contact information:   Primary MD      Follow up with JONES,DAVID S, MD. Schedule an appointment as soon as possible for a visit in 2 weeks.   Specialty:  Neurosurgery   Why:  Hospital follow up   Contact information:   [address] [place] [address] [phone]       Follow up with [facility]. Schedule an appointment as soon as possible for a visit in 1 week.   Contact information:   [address] [place] [address] [phone]      Follow up with [facility]. Schedule an appointment as soon as possible for a visit in 1 week.   Contact information:   [address]     [address] [place] [address] [phone]     Total Time spent on discharge equals  35 minutes.  SignedLeroy Sea 03/09/2016 10:24 AM

## 2016-03-05 NOTE — Progress Notes (Signed)
Occupational Therapy Treatment Patient Details Name: Daris Aristizabal MRN: [id number] DOB: February 11, 1961 Today's Date: 03/05/2016    History of present illness Mischelle Reeg is a 55 y.o. female with a history of ETOH Abuse, COPD, Hep C admitted with burning Epigastric ABD Pain and SOB, and Nausea Vomiting and Diarrhea.She drinks 3-4 pints of liquor daily and has done so for many years . Her Alcohol level was found at 298. She has a UTI and placed on ETOH withdrawal protocol   OT comments  Pt progressing towards acute OT goals. Pt completed short distance ambulation in hallway at her request. Pt still very unsteady and high fall risk, +2 for safety with ambulation. Pt also completed grooming tasks sitting EOB. Session details below. D/c plan updated to SNF. OT to continue to follow acutely.   Follow Up Recommendations  SNF    Equipment Recommendations  Other (comment) (defer to next venue)    Recommendations for Other Services      Precautions / Restrictions Precautions Precautions: Fall Precaution Comments: significant fall risk Restrictions Weight Bearing Restrictions: No       Mobility Bed Mobility Overal bed mobility: Needs Assistance Bed Mobility: Supine to Sit;Sit to Supine     Supine to sit: Min assist Sit to supine: Min assist   General bed mobility comments: Min A at times due to impulsivity.   Transfers Overall transfer level: Needs assistance Equipment used: 1 person hand held assist Transfers: Sit to/from Stand Sit to Stand: Min assist;Mod assist         General transfer comment: Mod A needed during stand to sit to control speed of decent with pt falling onto bed while attempting to adjust linens. Pt very unsteady.    Balance Overall balance assessment: Needs assistance Sitting-balance support: Feet supported Sitting balance-Leahy Scale: Fair Sitting balance - Comments: grooming tasks sitting EOB. Often pt in BUE support position.  Postural control:  Posterior lean Standing balance support: Bilateral upper extremity supported;During functional activity Standing balance-Leahy Scale: Poor                     ADL Overall ADL's : Needs assistance/impaired     Grooming: Wash/dry face;Oral care;Min guard;Sitting Grooming Details (indicate cue type and reason): set up and min guard for safety as pt has poor balance and activity tolerance and impaired cognition. decreased awareness with therapist intervening to take cup of water from pt with pt attempting to hold in hand while sitting EOB with hands on mattress. Impulsive. Environmental distractions minimized during session.                               Functional mobility during ADLs: Moderate assistance;+2 for safety/equipment General ADL Comments: Pt standing in doorway with nursing upon therapist arrival. Pt requesting to walk. Pt completed short distance in hallway. Very unteady, high fall risk. Did not follow multimodal cues to slow down. Chair follow provided. Once back in room pt attempting to adjust bed linens and needing assist to control speed of descent with pt falling into bed. Pt rested 3-4 minutes in bed then completed grooming tasks EOB. Cues to remain seated at times. Pt easily irritated.      Vision                     Perception     Praxis      Cognition   Behavior During Therapy: Restless;Agitated Overall Cognitive  Status: Impaired/Different from baseline Area of Impairment: Orientation;Attention;Memory;Following commands;Safety/judgement;Awareness;Problem solving Orientation Level: Time Current Attention Level: Focused Memory: Decreased recall of precautions;Decreased short-term memory  Following Commands: Follows one step commands inconsistently Safety/Judgement: Decreased awareness of safety;Decreased awareness of deficits Awareness: Intellectual Problem Solving: Slow processing;Decreased initiation;Difficulty sequencing;Requires  verbal cues;Requires tactile cues General Comments: very impulsive with inconsistent 1 step command following. able to conversate but confused and agitated. Decreased safety awareness. Stated date as May 06, 2016.     Extremity/Trunk Assessment               Exercises     Shoulder Instructions       General Comments      Pertinent Vitals/ Pain       Pain Assessment: 0-10 Pain Score: 10-Worst pain ever Pain Location: bilateral ankles extending up LEs after walking Pain Intervention(s): Limited activity within patient's tolerance;Monitored during session;Repositioned;Patient requesting pain meds-RN notified  Home Living                                          Prior Functioning/Environment              Frequency Min 2X/week     Progress Toward Goals  OT Goals(current goals can now be found in the care plan section)  Progress towards OT goals: Progressing toward goals  Acute Rehab OT Goals Patient Stated Goal: unable to state today OT Goal Formulation: Patient unable to participate in goal setting Time For Goal Achievement: 03/11/16 Potential to Achieve Goals: Good ADL Goals Pt Will Perform Grooming: with set-up;sitting Pt Will Perform Upper Body Bathing: with set-up;sitting Pt Will Transfer to Toilet: with set-up;stand pivot transfer;bedside commode Additional ADL Goal #1: Pt will complete 3 step command   Plan Discharge plan needs to be updated    Co-evaluation                 End of Session Equipment Utilized During Treatment: Gait belt;Rolling walker   Activity Tolerance Patient limited by pain;Other (comment) (anxious)   Patient Left in bed;with call bell/phone within reach;with bed alarm set;with nursing/sitter in room   Nurse Communication Mobility status        Time: 1355-1410 OT Time Calculation (min): 15 min  Charges: OT General Charges $OT Visit: 1 Procedure OT Treatments $Self Care/Home Management : 8-22  mins  Pilar GrammesMathews, Mickle Campton H 03/05/2016, 2:29 PM

## 2016-03-06 MED ORDER — GABAPENTIN 300 MG PO CAPS
300.0000 mg | ORAL_CAPSULE | Freq: Three times a day (TID) | ORAL | Status: DC
  Administered 2016-03-06 – 2016-03-09 (×9): 300 mg via ORAL
  Filled 2016-03-06 (×9): qty 1

## 2016-03-06 MED ORDER — CLONAZEPAM 1 MG PO TABS: 1.0000 mg | ORAL_TABLET | Freq: Two times a day (BID) | ORAL | Status: DC

## 2016-03-06 MED ORDER — CLONAZEPAM 1 MG PO TABS
1.0000 mg | ORAL_TABLET | Freq: Two times a day (BID) | ORAL | Status: DC
  Administered 2016-03-06 – 2016-03-08 (×5): 1 mg via ORAL
  Filled 2016-03-06 (×5): qty 1

## 2016-03-06 MED ORDER — GABAPENTIN 300 MG PO CAPS: 300.0000 mg | ORAL_CAPSULE | Freq: Three times a day (TID) | ORAL | Status: DC

## 2016-03-06 MED ORDER — HALOPERIDOL 2 MG PO TABS: 2.0000 mg | ORAL_TABLET | Freq: Three times a day (TID) | ORAL | Status: DC

## 2016-03-06 NOTE — Progress Notes (Signed)
Physical Therapy Treatment Patient Details Name: Rhonda GillesMichelle Mitchell MRN: [id number] DOB: 09-14-1961 Today's Date: 03/06/2016    History of Present Illness Rhonda GillesMichelle Mitchell is a 55 y.o. female with a history of ETOH Abuse, COPD, Hep C admitted with burning Epigastric ABD Pain and SOB, and Nausea Vomiting and Diarrhea.She drinks 3-4 pints of liquor daily and has done so for many years . Her Alcohol level was found at 298. She has a UTI and placed on ETOH withdrawal protocol    PT Comments    Patient is progressing toward mobility goals but continues to be high fall risk due to balance deficits and impulsivity. Educated pt on need for DME and assistance when ambulating. RN aware and nursing staff is currently monitoring pt.   Follow Up Recommendations  SNF     Equipment Recommendations  Rolling walker with 5" wheels    Recommendations for Other Services       Precautions / Restrictions Precautions Precautions: Fall Precaution Comments: significant fall risk Restrictions Weight Bearing Restrictions: No    Mobility  Bed Mobility               General bed mobility comments: received ambulating hallway with nursing staff  Transfers Overall transfer level: Needs assistance Equipment used: Rolling walker (2 wheeled) Transfers: Sit to/from Stand Sit to Stand: Independent;Min assist         General transfer comment: assist needed to maintain balance upon standing and verbal and tactile cues needed for use of RW  Ambulation/Gait Ambulation/Gait assistance: Min assist;+2 safety/equipment Ambulation Distance (Feet): 120 Feet Assistive device: Rolling walker (2 wheeled) Gait Pattern/deviations: Step-through pattern;Decreased stride length;Ataxic;Drifts right/left;Trunk flexed;Wide base of support     General Gait Details: multimodal cues needed for upright posture, safe use of DME, and to slow down cadenece; pt with impulsivity and tendency to drift R and L with decreased  safety awareness   Stairs            Wheelchair Mobility    Modified Rankin (Stroke Patients Only)       Balance Overall balance assessment: Needs assistance Sitting-balance support: Feet supported Sitting balance-Leahy Scale: Fair     Standing balance support: Bilateral upper extremity supported Standing balance-Leahy Scale: Fair                      Cognition Arousal/Alertness: Awake/alert Behavior During Therapy: Restless;Agitated;Impulsive Overall Cognitive Status: Impaired/Different from baseline Area of Impairment: Attention;Memory;Following commands;Safety/judgement;Awareness;Problem solving   Current Attention Level: Selective Memory: Decreased recall of precautions;Decreased short-term memory Following Commands: Follows one step commands inconsistently Safety/Judgement: Decreased awareness of safety;Decreased awareness of deficits Awareness: Intellectual Problem Solving: Slow processing;Decreased initiation;Difficulty sequencing;Requires verbal cues General Comments: decreased safety awareness and very impulsive    Exercises General Exercises - Lower Extremity Long Arc Quad: AROM;Both;15 reps;Seated Hip Flexion/Marching: AROM;Both;15 reps;Seated    General Comments General comments (skin integrity, edema, etc.): end of session pt will not stop getting up and ambulating in room despite educating pt on balance deficits and need for assistance for increased safety; pt verbalized agreement; RN aware and monitoring pt at end of session      Pertinent Vitals/Pain Pain Assessment: 0-10 Pain Score: 10-Worst pain ever Pain Location: everywhere Pain Descriptors / Indicators: Aching (pt stated "feels like I'm going to die") Pain Intervention(s): Monitored during session;RN gave pain meds during session    Home Living  Prior Function            PT Goals (current goals can now be found in the care plan section) Acute  Rehab PT Goals Patient Stated Goal: feel better Progress towards PT goals: Progressing toward goals    Frequency  Min 3X/week    PT Plan Current plan remains appropriate    Co-evaluation             End of Session Equipment Utilized During Treatment: Gait belt Activity Tolerance: Patient tolerated treatment well Patient left: in chair;with call bell/phone within reach;with nursing/sitter in room;with chair alarm set     Time: 1015-1040 PT Time Calculation (min) (ACUTE ONLY): 25 min  Charges:  $Gait Training: 8-22 mins $Therapeutic Activity: 8-22 mins                    G Codes:      Derek Mound, PTA Pager: [phone]   03/06/2016, 11:58 AM

## 2016-03-06 NOTE — Progress Notes (Addendum)
Patient is constantly standing up and staggering around room, confused, VERY unsafe.  She resists and swings at you when you try to help her not fall.  She refuses to stay in the bed.  While sitting in chair, 2 RN's and 1 Tech placed a safety belt on her to keep her in the chair safely.  Patient was actively biting, swinging, and extremely verbally abusive during the process. Jesse Sans Thomas RN At [employer]0830 Patient able to get out of posey easily, therefore removed after approximately 30 minutes, mood improved. Randie HeinzCain RNC  Late entry for 03/06/16 1600 Patient continues to get out of bed and ambulate in hall, using walker and steady with walker, but noted to be unsteady at times in the room.  Mood has improved throughout the day with periods where she is smiling and cooperative. CainRNCC

## 2016-03-06 NOTE — Progress Notes (Signed)
PATIENT DETAILS Name: Rhonda Mitchell Age: 55 y.o. Sex: female Date of Birth: 03-13-1961 Admit Date: 02/19/2016 Admitting Physician Ron Parker, MD PCP:No primary care provider on file.  Subjective: Ambulating in the hallway with RN-with a walker. Slightly anxious but calm and quiet.  Assessment/Plan: Principal Problem: Alcohol withdrawal: Resolved, managed with Ativan per protocol. Now awake alert, not tremulous.  Active Problems: Lower extremity weakness: Exam seems to be inconsistent at times-initially did appear to have mild lower ext weakness- Right >left-but seems to have improved over the past few days. Knee reflex are brisk.Current strength of lower extremities around 5/5 on day of discharge. MRI of LS spine and MRI thoracic spine negative for acute abnormalities. Note MRI LS spine was discussed with neurosurgery-Dr. Yetta Barre over the phone-he does not think that any of the mentioned changes can account for patient's symptoms.Neurology consulted-recommended MRI C Spine-however unable to complete x 2 even with IV Ativan. I doubt clinically that patient has C Spine abnormalities-and doubt utility of C spine. Subsequently seen by neurology-Dr.Nandigam on 3/15, per his evaluation, no need to proceed with C-spine MRI. He had no further recommendation at signed off. Initially very reluctant to go to rehabilitation at SNF, now agreeable. Have consulted social work. Please note TSH/vitamin B12 within normal limits. Will need outpatient evaluation by neurosurgery-Dr. Marikay Alar   COPD: Lungs clear, continue bronchodilators.  Hypokalemia/hypomagnesemia: Suspect secondary to GI loss and alcohol.    Anemia:  Secondary to chronic disease-no evidence of blood loss. Follow.  Thrombocytopenia: Suspect secondary to alcohol use, improving.  Escherichia coli UTI: Completed course of antibiotics.  History of chronic hepatitis C: Stable for outpatient follow-up.  History of  depression/alcohol induced mood disorder:  Continue with Haldol, Cymbalta, Neurontin. Psychiatry consulted during this hospital stay-deemed to have capacity to make decisions. Given ongoing anxiety, have added Klonopin.  Disposition: Remain inpatient-SNF  Antimicrobial agents  See below  Anti-infectives    Start     Dose/Rate Route Frequency Ordered Stop   02/22/16 1800  sulfamethoxazole-trimethoprim (BACTRIM DS,SEPTRA DS) 800-160 MG per tablet 1 tablet  Status:  Discontinued     1 tablet Oral Every 12 hours 02/22/16 1000 02/24/16 0944   02/21/16 0300  cefTRIAXone (ROCEPHIN) 1 g in dextrose 5 % 50 mL IVPB  Status:  Discontinued     1 g 100 mL/hr over 30 Minutes Intravenous Every 24 hours 02/20/16 0436 02/22/16 0959   02/20/16 1030  metroNIDAZOLE (FLAGYL) IVPB 500 mg  Status:  Discontinued     500 mg 100 mL/hr over 60 Minutes Intravenous Every 8 hours 02/20/16 1030 02/21/16 1146   02/20/16 1000  vancomycin (VANCOCIN) 50 mg/mL oral solution 125 mg  Status:  Discontinued     125 mg Oral 4 times daily 02/20/16 0833 02/20/16 1030   02/20/16 0300  cefTRIAXone (ROCEPHIN) 1 g in dextrose 5 % 50 mL IVPB     1 g 100 mL/hr over 30 Minutes Intravenous  Once 02/20/16 0254 02/20/16 0459      DVT Prophylaxis: Lovenox  Code Status: Full code  Family Communication None at bedside  Procedures: None  CONSULTS:  Neurology  Time spent 20 minutes-Greater than 50% of this time was spent in counseling, explanation of diagnosis, planning of further management, and coordination of care.  MEDICATIONS: Scheduled Meds: . benztropine  0.5 mg Oral BID  . clonazePAM  1 mg Oral BID  . DULoxetine  30 mg  Oral Daily  . enoxaparin (LOVENOX) injection  40 mg Subcutaneous Q24H  . gabapentin  300 mg Oral TID  . haloperidol  2 mg Oral TID  . mometasone-formoterol  2 puff Inhalation BID  . nicotine  21 mg Transdermal Daily  . sodium chloride flush  10-40 mL Intracatheter Q12H   Continuous Infusions:    PRN Meds:.acetaminophen **OR** acetaminophen, albuterol, alum & mag hydroxide-simeth, LORazepam, ondansetron **OR** ondansetron (ZOFRAN) IV, oxyCODONE, sodium chloride flush, traZODone    PHYSICAL EXAM: Vital signs in last 24 hours: Filed Vitals:   03/05/16 0914 03/05/16 1300 03/05/16 2015 03/06/16 0443  BP:  114/74 117/75 99/59  Pulse:  74 74 69  Temp:  97.8 F (36.6 C) 98 F (36.7 C) 97 F (36.1 C)  TempSrc:  Oral Oral Axillary  Resp:  Height:      Weight:      SpO2: 96% 96% 96% 96%    Weight change:  Filed Weights   02/20/16 1956  Weight: 54 kg (119 lb 0.8 oz)   Body mass index is 20.42 kg/(m^2).   Gen Exam: sedated-but awake-mostly alert Neck: Supple, No JVD.   Chest: B/L Clear.   CVS: S1 S2 Regular, no murmurs.  Abdomen: soft, BS +, non tender, non distended.  Extremities: No edema, lower extremities warm to touch Neurologic:   Non focal-almost 5/5 today  generalized weakness as well.  Skin: No Rash.   Wounds: N/A.    Intake/Output from previous day:  Intake/Output Summary (Last 24 hours) at 03/06/16 1607 Last data filed at 03/06/16 1048  Gross per 24 hour  Intake    720 ml  Output      0 ml  Net    720 ml     LAB RESULTS: CBC No results for input(s): WBC, HGB, HCT, PLT, MCV, MCH, MCHC, RDW, LYMPHSABS, MONOABS, EOSABS, BASOSABS, BANDABS in the last 168 hours.  Invalid input(s): NEUTRABS, BANDSABD  Chemistries  No results for input(s): NA, K, CL, CO2, GLUCOSE, BUN, CREATININE, CALCIUM, MG in the last 168 hours.  CBG: No results for input(s): GLUCAP in the last 168 hours.  GFR Estimated Creatinine Clearance: 68.5 mL/min (by C-G formula based on Cr of 0.57).  Coagulation profile No results for input(s): INR, PROTIME in the last 168 hours.  Cardiac Enzymes No results for input(s): CKMB, TROPONINI, MYOGLOBIN in the last 168 hours.  Invalid input(s): CK  Invalid input(s): POCBNP No results for input(s): DDIMER in the last 72  hours. No results for input(s): HGBA1C in the last 72 hours. No results for input(s): CHOL, HDL, LDLCALC, TRIG, CHOLHDL, LDLDIRECT in the last 72 hours. No results for input(s): TSH, T4TOTAL, T3FREE, THYROIDAB in the last 72 hours.  Invalid input(s): FREET3 No results for input(s): VITAMINB12, FOLATE, FERRITIN, TIBC, IRON, RETICCTPCT in the last 72 hours. No results for input(s): LIPASE, AMYLASE in the last 72 hours.  Urine Studies No results for input(s): UHGB, CRYS in the last 72 hours.  Invalid input(s): UACOL, UAPR, USPG, UPH, UTP, UGL, UKET, UBIL, UNIT, UROB, ULEU, UEPI, UWBC, URBC, UBAC, CAST, UCOM, BILUA  MICROBIOLOGY: No results found for this or any previous visit (from the past 240 hour(s)).  RADIOLOGY STUDIES/RESULTS: Mr Thoracic Spine Wo Contrast  03/01/2016  CLINICAL DATA:  Initial evaluation for lower extremity weakness. EXAM: MRI THORACIC SPINE WITHOUT CONTRAST TECHNIQUE: Multiplanar, multisequence MR imaging of the thoracic spine was performed. No intravenous contrast was administered. COMPARISON:  None. FINDINGS: Study fairly degraded by  motion artifact. Vertebral bodies are normally aligned with preservation of the normal thoracic kyphosis. No listhesis. Mild chronic height loss at the superior endplate of T11. Vertebral body heights otherwise maintained. No fracture or malalignment. Signal intensity within the vertebral body bone marrow is normal. No focal osseous lesions. Minimal edema at the superior endplate of T2 seen associated with a small Schmorl's node. No other marrow edema. Signal intensity within the thoracic spinal cord is grossly normal. No acute paraspinous soft tissue abnormality. Visualized lungs are grossly clear. T2 hyperintense cyst noted within the partially visualized left kidney. Visualized visceral structures are otherwise unremarkable. No significant degenerative changes identified within the thoracic spine. No focal disc herniation. No evidence for cord  compression or significant stenosis. IMPRESSION: 1. Motion degraded study. 2. Grossly normal MRI of the thoracic spine. No evidence for cord compression or significant stenosis to explain bilateral lower extremity symptoms. No abnormal cord signal. Electronically Signed   By: Rise MuBenjamin  McClintock M.D.   On: 03/01/2016 18:49   Mr Lumbar Spine W Wo Contrast  02/26/2016  CLINICAL DATA:  Profound bilateral lower extremity weakness, right greater than left. EXAM: MRI LUMBAR SPINE WITHOUT AND WITH CONTRAST TECHNIQUE: Multiplanar and multiecho pulse sequences of the lumbar spine were obtained without and with intravenous contrast. CONTRAST:  10 mL MultiHance COMPARISON:  CT abdomen and pelvis 12/18/2015. FINDINGS: Normal signal is present in the conus medullaris which terminates at L1. Bilateral L5 pars defects are present. Grade 1 anterolisthesis at L5-S1 is stable, measuring 6 mm. There is chronic fatty marrow infiltration of the L5 vertebral body and as well as the inferior endplate of L4 and superior endplate of S1. Marrow signal, vertebral body heights, and alignment are otherwise normal. Limited imaging of the abdomen is unremarkable. The disc levels at L2-3 and above are normal. L3-4: A mild broad-based disc protrusion is present. There is mild facet hypertrophy bilaterally. No significant stenosis is present. L4-5: A broad-based disc protrusion is present. There is mild facet hypertrophy bilaterally. Slight retrolisthesis is noted. The disc extends into the right neural foramen without significant stenosis. L5-S1: There is uncovering of a broad-based disc protrusion associated with the anterolisthesis. In combination with facet spurring this leads to moderate to severe foraminal narrowing bilaterally, left greater than right. IMPRESSION: 1. 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 2. Uncovering of a broad-based disc protrusion and facet spurring at L5-S1 leads to moderate to  severe foraminal stenosis bilaterally, left greater than right. 3. Slight retrolisthesis and a rightward disc protrusion at L4-5 without significant stenosis. 4. Mild broad-based disc protrusion and facet hypertrophy at L3-4 without significant stenosis. Electronically Signed   By: Marin Robertshristopher  Mattern M.D.   On: 02/26/2016 19:44   Dg Abd Acute W/chest  02/19/2016  CLINICAL DATA:  55 year old female with abdominal pain EXAM: DG ABDOMEN ACUTE W/ 1V CHEST COMPARISON:  The CT dated 12/18/2015 FINDINGS: The lungs are clear. No pleural effusion or pneumothorax. The cardiac silhouette is within normal limits. There is no evidence of bowel obstruction or free air. No radiopaque calculi identified. IMPRESSION: No radiopaque foreign object. There is degenerative changes of the osseous structures. No acute fracture. Electronically Signed   By: Elgie CollardArash  Radparvar M.D.   On: 02/19/2016 23:42    Jeoffrey MassedGHIMIRE,Simpson Paulos, MD  [facility] Pager:[phone]  If 7PM-7AM, please contact night-coverage [email or web address] Password [email or web address] 03/06/2016, 4:07 PM   LOS: 15 days

## 2016-03-06 NOTE — Clinical Social Work Note (Addendum)
Contact made with [facility] [phone]) regarding patient and clinicals resent through [facility]. Per Mor[facility]Ashley at [facility], she will give information to Sun MicrosystemsDeborah, admissions coordinator. Follow-up will be made with [facility] to determine if they can accept patient.    Genelle BalVanessa Syanna Remmert, MSW, LCSW Licensed Clinical Social Worker Clinical Social Work Department [facility] [phone]

## 2016-03-07 DIAGNOSIS — J449 Chronic obstructive pulmonary disease, unspecified: Secondary | ICD-10-CM

## 2016-03-07 DIAGNOSIS — F1010 Alcohol abuse, uncomplicated: Secondary | ICD-10-CM

## 2016-03-07 DIAGNOSIS — R29898 Other symptoms and signs involving the musculoskeletal system: Secondary | ICD-10-CM

## 2016-03-07 NOTE — Progress Notes (Signed)
PATIENT DETAILS Name: Rhonda GillesMichelle Zanni Age: 55 y.o. Sex: female Date of Birth: 1961-04-06 Admit Date: 02/19/2016 Admitting Physician Ron ParkerHarvette C Jenkins, MD PCP:No primary care provider on file.  Brief narrative: 55 year old female with history of depression, alcohol Abuse, admitted with alcohol withdrawal. Hospital course complicated by delirium tremens requiring a prolonged stay in the stepdown unit. Hospital course also complicated by deconditioning with lower extremity weakness. Underwent MRI thoracic spine, lumbar spine and neurology evaluation with no obvious etiology to explain lower extremity weakness. Slowly improving, now ambulating in the hallway with a rolling walker. Awaiting SNF placement.  Subjective: Ambulating in the hallway with fiance-with a walker.  Assessment/Plan: Principal Problem: Alcohol withdrawal: Resolved, managed with Ativan per protocol. Now awake alert, not tremulous.  Active Problems: Lower extremity weakness: Exam seems to be inconsistent at times-initially did appear to have mild lower ext weakness- Right >left-but seems to have improved over the past few days. Knee reflex are brisk.Current strength of lower extremities around 5/5 on day of discharge. MRI of LS spine and MRI thoracic spine negative for acute abnormalities. Note MRI LS spine was discussed with neurosurgery-Dr. Yetta BarreJones over the phone-he does not think that any of the mentioned changes can account for patient's symptoms.Neurology consulted-recommended MRI C Spine-however unable to complete x 2 even with IV Ativan. I doubt clinically that patient has C Spine abnormalities-and doubt utility of C spine. Subsequently seen by neurology-Dr.Nandigam on 3/15, per his evaluation, no need to proceed with C-spine MRI. He had no further recommendation at signed off. Initially very reluctant to go to rehabilitation at SNF, now agreeable. Have consulted social work. Please note TSH/vitamin B12  within normal limits. Will need outpatient evaluation by neurosurgery-Dr. Marikay Alaravid Jones   History of depression/alcohol induced mood disorder: Has had episodes of anxiety, agitation and impulsiveness throughout this hospital stay. Psychiatry has been consulted, patient has the capacity to make medical decisions. Medications have been adjusted. Currently one with Haldol, Cymbalta, Neurontin and Klonopin  COPD: Lungs clear, continue bronchodilators.  Hypokalemia/hypomagnesemia: Suspect secondary to GI loss and alcohol.    Anemia:  Secondary to chronic disease-no evidence of blood loss. Follow.  Thrombocytopenia: Suspect secondary to alcohol use, improving.  Escherichia coli UTI: Completed course of antibiotics.  History of chronic hepatitis C: Stable for outpatient follow-up.  Disposition: Remain inpatient-SNF when bed available  Antimicrobial agents  See below  Anti-infectives    Start     Dose/Rate Route Frequency Ordered Stop   02/22/16 1800  sulfamethoxazole-trimethoprim (BACTRIM DS,SEPTRA DS) 800-160 MG per tablet 1 tablet  Status:  Discontinued     1 tablet Oral Every 12 hours 02/22/16 1000 02/24/16 0944   02/21/16 0300  cefTRIAXone (ROCEPHIN) 1 g in dextrose 5 % 50 mL IVPB  Status:  Discontinued     1 g 100 mL/hr over 30 Minutes Intravenous Every 24 hours 02/20/16 0436 02/22/16 0959   02/20/16 1030  metroNIDAZOLE (FLAGYL) IVPB 500 mg  Status:  Discontinued     500 mg 100 mL/hr over 60 Minutes Intravenous Every 8 hours 02/20/16 1030 02/21/16 1146   02/20/16 1000  vancomycin (VANCOCIN) 50 mg/mL oral solution 125 mg  Status:  Discontinued     125 mg Oral 4 times daily 02/20/16 0833 02/20/16 1030   02/20/16 0300  cefTRIAXone (ROCEPHIN) 1 g in dextrose 5 % 50 mL IVPB     1 g 100 mL/hr over 30 Minutes Intravenous  Once  02/20/16 0254 02/20/16 0459      DVT Prophylaxis: Lovenox  Code Status: Full code  Family Communication Fiance at  bedside  Procedures: None  CONSULTS:  Neurology  Time spent 20 minutes-Greater than 50% of this time was spent in counseling, explanation of diagnosis, planning of further management, and coordination of care.  MEDICATIONS: Scheduled Meds: . benztropine  0.5 mg Oral BID  . clonazePAM  1 mg Oral BID  . DULoxetine  30 mg Oral Daily  . enoxaparin (LOVENOX) injection  40 mg Subcutaneous Q24H  . gabapentin  300 mg Oral TID  . haloperidol  2 mg Oral TID  . mometasone-formoterol  2 puff Inhalation BID  . nicotine  21 mg Transdermal Daily  . sodium chloride flush  10-40 mL Intracatheter Q12H   Continuous Infusions:  PRN Meds:.acetaminophen **OR** acetaminophen, albuterol, alum & mag hydroxide-simeth, LORazepam, ondansetron **OR** ondansetron (ZOFRAN) IV, oxyCODONE, sodium chloride flush, traZODone    PHYSICAL EXAM: Vital signs in last 24 hours: Filed Vitals:   03/06/16 2222 03/07/16 0551 03/07/16 0802 03/07/16 0937  BP: 141/81 146/81 109/71   Pulse: 80 83 81 82  Temp: 98.1 F (36.7 C) 98.1 F (36.7 C) 98.1 F (36.7 C)   TempSrc: Oral Oral Oral   Resp: Height:      Weight:      SpO2: 99% 98% 98% 98%    Weight change:  Filed Weights   02/20/16 1956  Weight: 54 kg (119 lb 0.8 oz)   Body mass index is 20.42 kg/(m^2).   Gen Exam: sedated-but awake-mostly alert Neck: Supple, No JVD.   Chest: B/L Clear.   CVS: S1 S2 Regular, no murmurs.  Abdomen: soft, BS +, non tender, non distended.  Extremities: No edema, lower extremities warm to touch Neurologic:   Non focal-almost 5/5 today  generalized weakness as well.  Skin: No Rash.   Wounds: N/A.    Intake/Output from previous day:  Intake/Output Summary (Last 24 hours) at 03/07/16 1256 Last data filed at 03/07/16 1132  Gross per 24 hour  Intake    400 ml  Output      0 ml  Net    400 ml     LAB RESULTS: CBC No results for input(s): WBC, HGB, HCT, PLT, MCV, MCH, MCHC, RDW, LYMPHSABS, MONOABS,  EOSABS, BASOSABS, BANDABS in the last 168 hours.  Invalid input(s): NEUTRABS, BANDSABD  Chemistries  No results for input(s): NA, K, CL, CO2, GLUCOSE, BUN, CREATININE, CALCIUM, MG in the last 168 hours.  CBG: No results for input(s): GLUCAP in the last 168 hours.  GFR Estimated Creatinine Clearance: 68.5 mL/min (by C-G formula based on Cr of 0.57).  Coagulation profile No results for input(s): INR, PROTIME in the last 168 hours.  Cardiac Enzymes No results for input(s): CKMB, TROPONINI, MYOGLOBIN in the last 168 hours.  Invalid input(s): CK  Invalid input(s): POCBNP No results for input(s): DDIMER in the last 72 hours. No results for input(s): HGBA1C in the last 72 hours. No results for input(s): CHOL, HDL, LDLCALC, TRIG, CHOLHDL, LDLDIRECT in the last 72 hours. No results for input(s): TSH, T4TOTAL, T3FREE, THYROIDAB in the last 72 hours.  Invalid input(s): FREET3 No results for input(s): VITAMINB12, FOLATE, FERRITIN, TIBC, IRON, RETICCTPCT in the last 72 hours. No results for input(s): LIPASE, AMYLASE in the last 72 hours.  Urine Studies No results for input(s): UHGB, CRYS in the last 72 hours.  Invalid input(s): UACOL, UAPR, USPG, UPH, UTP,  UGL, UKET, UBIL, UNIT, UROB, ULEU, UEPI, UWBC, URBC, UBAC, CAST, UCOM, BILUA  MICROBIOLOGY: No results found for this or any previous visit (from the past 240 hour(s)).  RADIOLOGY STUDIES/RESULTS: Mr Thoracic Spine Wo Contrast  03/01/2016  CLINICAL DATA:  Initial evaluation for lower extremity weakness. EXAM: MRI THORACIC SPINE WITHOUT CONTRAST TECHNIQUE: Multiplanar, multisequence MR imaging of the thoracic spine was performed. No intravenous contrast was administered. COMPARISON:  None. FINDINGS: Study fairly degraded by motion artifact. Vertebral bodies are normally aligned with preservation of the normal thoracic kyphosis. No listhesis. Mild chronic height loss at the superior endplate of T11. Vertebral body heights otherwise  maintained. No fracture or malalignment. Signal intensity within the vertebral body bone marrow is normal. No focal osseous lesions. Minimal edema at the superior endplate of T2 seen associated with a small Schmorl's node. No other marrow edema. Signal intensity within the thoracic spinal cord is grossly normal. No acute paraspinous soft tissue abnormality. Visualized lungs are grossly clear. T2 hyperintense cyst noted within the partially visualized left kidney. Visualized visceral structures are otherwise unremarkable. No significant degenerative changes identified within the thoracic spine. No focal disc herniation. No evidence for cord compression or significant stenosis. IMPRESSION: 1. Motion degraded study. 2. Grossly normal MRI of the thoracic spine. No evidence for cord compression or significant stenosis to explain bilateral lower extremity symptoms. No abnormal cord signal. Electronically Signed   By: Rise Mu M.D.   On: 03/01/2016 18:49   Mr Lumbar Spine W Wo Contrast  02/26/2016  CLINICAL DATA:  Profound bilateral lower extremity weakness, right greater than left. EXAM: MRI LUMBAR SPINE WITHOUT AND WITH CONTRAST TECHNIQUE: Multiplanar and multiecho pulse sequences of the lumbar spine were obtained without and with intravenous contrast. CONTRAST:  10 mL MultiHance COMPARISON:  CT abdomen and pelvis 12/18/2015. FINDINGS: Normal signal is present in the conus medullaris which terminates at L1. Bilateral L5 pars defects are present. Grade 1 anterolisthesis at L5-S1 is stable, measuring 6 mm. There is chronic fatty marrow infiltration of the L5 vertebral body and as well as the inferior endplate of L4 and superior endplate of S1. Marrow signal, vertebral body heights, and alignment are otherwise normal. Limited imaging of the abdomen is unremarkable. The disc levels at L2-3 and above are normal. L3-4: A mild broad-based disc protrusion is present. There is mild facet hypertrophy bilaterally. No  significant stenosis is present. L4-5: A broad-based disc protrusion is present. There is mild facet hypertrophy bilaterally. Slight retrolisthesis is noted. The disc extends into the right neural foramen without significant stenosis. L5-S1: There is uncovering of a broad-based disc protrusion associated with the anterolisthesis. In combination with facet spurring this leads to moderate to severe foraminal narrowing bilaterally, left greater than right. IMPRESSION: 1. 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 2. Uncovering of a broad-based disc protrusion and facet spurring at L5-S1 leads to moderate to severe foraminal stenosis bilaterally, left greater than right. 3. Slight retrolisthesis and a rightward disc protrusion at L4-5 without significant stenosis. 4. Mild broad-based disc protrusion and facet hypertrophy at L3-4 without significant stenosis. Electronically Signed   By: Marin Roberts M.D.   On: 02/26/2016 19:44   Dg Abd Acute W/chest  02/19/2016  CLINICAL DATA:  55 year old female with abdominal pain EXAM: DG ABDOMEN ACUTE W/ 1V CHEST COMPARISON:  The CT dated 12/18/2015 FINDINGS: The lungs are clear. No pleural effusion or pneumothorax. The cardiac silhouette is within normal limits. There is no evidence of  bowel obstruction or free air. No radiopaque calculi identified. IMPRESSION: No radiopaque foreign object. There is degenerative changes of the osseous structures. No acute fracture. Electronically Signed   By: Elgie Collard M.D.   On: 02/19/2016 23:42    Jeoffrey Massed, MD  [facility] Pager:[phone]  If 7PM-7AM, please contact night-coverage [email or web address] Password [email or web address] 03/07/2016, 12:56 PM   LOS: 16 days

## 2016-03-07 NOTE — Progress Notes (Signed)
Patient requesting daily medications at this time. Spoke with pharmacy, OK to give 10 a.m. medications now.

## 2016-03-08 MED ORDER — LORAZEPAM 2 MG/ML IJ SOLN
0.5000 mg | Freq: Two times a day (BID) | INTRAMUSCULAR | Status: DC | PRN
  Administered 2016-03-08: 0.5 mg via INTRAVENOUS
  Filled 2016-03-08 (×2): qty 1

## 2016-03-08 MED ORDER — CLONAZEPAM 0.5 MG PO TABS
0.5000 mg | ORAL_TABLET | Freq: Two times a day (BID) | ORAL | Status: DC
  Administered 2016-03-08: 0.5 mg via ORAL
  Filled 2016-03-08 (×2): qty 1

## 2016-03-08 NOTE — Progress Notes (Signed)
PATIENT DETAILS Name: Rhonda Mitchell Age: 55 y.o. Sex: female Date of Birth: 1961/05/31 Admit Date: 02/19/2016 Admitting Physician Ron Parker, MD PCP:No primary care provider on file.  Brief narrative: 55 year old female with history of depression, alcohol Abuse, admitted with alcohol withdrawal. Hospital course complicated by delirium tremens requiring a prolonged stay in the stepdown unit. Hospital course also complicated by deconditioning with lower extremity weakness. Underwent MRI thoracic spine, lumbar spine and neurology evaluation with no obvious etiology to explain lower extremity weakness. Slowly improving, now ambulating in the hallway with a rolling walker. Awaiting SNF placement.  Subjective:  She is a little somnolent, denies any headache, no chest abdominal pain, no focal weakness. Appears comfortable  Assessment/Plan:   Alcohol withdrawal with DTs: Resolved, no signs of DTs at this point, reduce benzodiazepines as patient appears to be somnolent.    Lower extremity weakness: Exam seems to be inconsistent at times-initially did appear to have mild lower ext weakness- Right >left-but seems to have improved over the past few days. Knee reflex are brisk.Current strength of lower extremities around 5/5 on day of discharge. MRI of LS spine and MRI thoracic spine negative for acute abnormalities. Note MRI LS spine was discussed with neurosurgery-Dr. Yetta Barre over the phone-he does not think that any of the mentioned changes can account for patient's symptoms. Neurology consulted-recommended MRI C Spine-however unable to complete x 2 even with IV Ativan. I doubt clinically that patient has C Spine abnormalities-and doubt utility of C spine. Subsequently seen by neurology-Dr.Nandigam on 3/15, per his evaluation, no need to proceed with C-spine MRI. He had no further recommendation at signed off.  Initially very reluctant to go to rehabilitation at SNF, now  agreeable. Have consulted social work. Please note TSH/vitamin B12 within normal limits. Will need outpatient evaluation by neurosurgery-Dr. Marikay Alar, patient now is ambulating in the hallway with a walker.    History of depression/alcohol induced mood disorder: Has had episodes of anxiety, agitation and impulsiveness throughout this hospital stay. Psychiatry has been consulted, patient has the capacity to make medical decisions. Medications have been adjusted. Currently one with Haldol, Cymbalta, Neurontin and Klonopin  COPD: Lungs clear, continue bronchodilators.  Hypokalemia/hypomagnesemia: Suspect secondary to GI loss and alcohol.    Anemia:  Secondary to chronic disease-no evidence of blood loss. Follow.  Thrombocytopenia: Suspect secondary to alcohol use, improving.  Escherichia coli UTI: Completed course of antibiotics.  History of chronic hepatitis C: Stable for outpatient follow-up.   Disposition: Remain inpatient-SNF when bed available  Antimicrobial agents See below  Anti-infectives    Start     Dose/Rate Route Frequency Ordered Stop   02/22/16 1800  sulfamethoxazole-trimethoprim (BACTRIM DS,SEPTRA DS) 800-160 MG per tablet 1 tablet  Status:  Discontinued     1 tablet Oral Every 12 hours 02/22/16 1000 02/24/16 0944   02/21/16 0300  cefTRIAXone (ROCEPHIN) 1 g in dextrose 5 % 50 mL IVPB  Status:  Discontinued     1 g 100 mL/hr over 30 Minutes Intravenous Every 24 hours 02/20/16 0436 02/22/16 0959   02/20/16 1030  metroNIDAZOLE (FLAGYL) IVPB 500 mg  Status:  Discontinued     500 mg 100 mL/hr over 60 Minutes Intravenous Every 8 hours 02/20/16 1030 02/21/16 1146   02/20/16 1000  vancomycin (VANCOCIN) 50 mg/mL oral solution 125 mg  Status:  Discontinued     125 mg Oral 4 times daily 02/20/16 0833 02/20/16 1030  02/20/16 0300  cefTRIAXone (ROCEPHIN) 1 g in dextrose 5 % 50 mL IVPB     1 g 100 mL/hr over 30 Minutes Intravenous  Once 02/20/16 0254 02/20/16 0459      DVT  Prophylaxis: Lovenox  Code Status: Full code  Family Communication None present at bedside  Procedures: None  CONSULTS:   Neurology  Time spent 20 minutes-Greater than 50% of this time was spent in counseling, explanation of diagnosis, planning of further management, and coordination of care.  MEDICATIONS: Scheduled Meds: . benztropine  0.5 mg Oral BID  . clonazePAM  0.5 mg Oral BID  . DULoxetine  30 mg Oral Daily  . enoxaparin (LOVENOX) injection  40 mg Subcutaneous Q24H  . gabapentin  300 mg Oral TID  . haloperidol  2 mg Oral TID  . mometasone-formoterol  2 puff Inhalation BID  . nicotine  21 mg Transdermal Daily   Continuous Infusions:  PRN Meds:.acetaminophen **OR** [DISCONTINUED] acetaminophen, albuterol, alum & mag hydroxide-simeth, LORazepam, [DISCONTINUED] ondansetron **OR** ondansetron (ZOFRAN) IV, oxyCODONE, traZODone    PHYSICAL EXAM: Vital signs in last 24 hours: Filed Vitals:   03/07/16 0802 03/07/16 0937 03/07/16 1405 03/08/16 0535  BP: 109/71  112/64 156/83  Pulse: 81 82 81 69  Temp: 98.1 F (36.7 C)  98 F (36.7 C) 98.1 F (36.7 C)  TempSrc: Oral  Oral Oral  Resp: 21 21 19 20   Height:      Weight:      SpO2: 98% 98% 91% 90%    Weight change:  Filed Weights   02/20/16 1956  Weight: 54 kg (119 lb 0.8 oz)   Body mass index is 20.42 kg/(m^2).   Gen Exam: Little sleepy but denies any headache chest or abdominal pain Neck: Supple, No JVD.   Chest: B/L Clear.   CVS: S1 S2 Regular, no murmurs.  Abdomen: soft, BS +, non tender, non distended.  Extremities: No edema, lower extremities warm to touch Neurologic:   Non focal-almost 5/5 today, Generalized weakness as well.  Skin: No Rash.   Wounds: N/A.    Intake/Output from previous day:  Intake/Output Summary (Last 24 hours) at 03/08/16 1200 Last data filed at 03/08/16 0908  Gross per 24 hour  Intake   1180 ml  Output    300 ml  Net    880 ml     LAB RESULTS: CBC No results for  input(s): WBC, HGB, HCT, PLT, MCV, MCH, MCHC, RDW, LYMPHSABS, MONOABS, EOSABS, BASOSABS, BANDABS in the last 168 hours.  Invalid input(s): NEUTRABS, BANDSABD  Chemistries  No results for input(s): NA, K, CL, CO2, GLUCOSE, BUN, CREATININE, CALCIUM, MG in the last 168 hours.  CBG: No results for input(s): GLUCAP in the last 168 hours.  GFR Estimated Creatinine Clearance: 68.5 mL/min (by C-G formula based on Cr of 0.57).  Coagulation profile No results for input(s): INR, PROTIME in the last 168 hours.  Cardiac Enzymes No results for input(s): CKMB, TROPONINI, MYOGLOBIN in the last 168 hours.  Invalid input(s): CK  Invalid input(s): POCBNP No results for input(s): DDIMER in the last 72 hours. No results for input(s): HGBA1C in the last 72 hours. No results for input(s): CHOL, HDL, LDLCALC, TRIG, CHOLHDL, LDLDIRECT in the last 72 hours. No results for input(s): TSH, T4TOTAL, T3FREE, THYROIDAB in the last 72 hours.  Invalid input(s): FREET3 No results for input(s): VITAMINB12, FOLATE, FERRITIN, TIBC, IRON, RETICCTPCT in the last 72 hours. No results for input(s): LIPASE, AMYLASE in the last 72 hours.  Urine Studies No results for input(s): UHGB, CRYS in the last 72 hours.  Invalid input(s): UACOL, UAPR, USPG, UPH, UTP, UGL, UKET, UBIL, UNIT, UROB, ULEU, UEPI, UWBC, URBC, UBAC, CAST, UCOM, BILUA  MICROBIOLOGY: No results found for this or any previous visit (from the past 240 hour(s)).  RADIOLOGY STUDIES/RESULTS: Mr Thoracic Spine Wo Contrast  03/01/2016  CLINICAL DATA:  Initial evaluation for lower extremity weakness. EXAM: MRI THORACIC SPINE WITHOUT CONTRAST TECHNIQUE: Multiplanar, multisequence MR imaging of the thoracic spine was performed. No intravenous contrast was administered. COMPARISON:  None. FINDINGS: Study fairly degraded by motion artifact. Vertebral bodies are normally aligned with preservation of the normal thoracic kyphosis. No listhesis. Mild chronic height loss  at the superior endplate of T11. Vertebral body heights otherwise maintained. No fracture or malalignment. Signal intensity within the vertebral body bone marrow is normal. No focal osseous lesions. Minimal edema at the superior endplate of T2 seen associated with a small Schmorl's node. No other marrow edema. Signal intensity within the thoracic spinal cord is grossly normal. No acute paraspinous soft tissue abnormality. Visualized lungs are grossly clear. T2 hyperintense cyst noted within the partially visualized left kidney. Visualized visceral structures are otherwise unremarkable. No significant degenerative changes identified within the thoracic spine. No focal disc herniation. No evidence for cord compression or significant stenosis. IMPRESSION: 1. Motion degraded study. 2. Grossly normal MRI of the thoracic spine. No evidence for cord compression or significant stenosis to explain bilateral lower extremity symptoms. No abnormal cord signal. Electronically Signed   By: Rise Mu M.D.   On: 03/01/2016 18:49   Mr Lumbar Spine W Wo Contrast  02/26/2016  CLINICAL DATA:  Profound bilateral lower extremity weakness, right greater than left. EXAM: MRI LUMBAR SPINE WITHOUT AND WITH CONTRAST TECHNIQUE: Multiplanar and multiecho pulse sequences of the lumbar spine were obtained without and with intravenous contrast. CONTRAST:  10 mL MultiHance COMPARISON:  CT abdomen and pelvis 12/18/2015. FINDINGS: Normal signal is present in the conus medullaris which terminates at L1. Bilateral L5 pars defects are present. Grade 1 anterolisthesis at L5-S1 is stable, measuring 6 mm. There is chronic fatty marrow infiltration of the L5 vertebral body and as well as the inferior endplate of L4 and superior endplate of S1. Marrow signal, vertebral body heights, and alignment are otherwise normal. Limited imaging of the abdomen is unremarkable. The disc levels at L2-3 and above are normal. L3-4: A mild broad-based disc  protrusion is present. There is mild facet hypertrophy bilaterally. No significant stenosis is present. L4-5: A broad-based disc protrusion is present. There is mild facet hypertrophy bilaterally. Slight retrolisthesis is noted. The disc extends into the right neural foramen without significant stenosis. L5-S1: There is uncovering of a broad-based disc protrusion associated with the anterolisthesis. In combination with facet spurring this leads to moderate to severe foraminal narrowing bilaterally, left greater than right. IMPRESSION: 1. 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 2. Uncovering of a broad-based disc protrusion and facet spurring at L5-S1 leads to moderate to severe foraminal stenosis bilaterally, left greater than right. 3. Slight retrolisthesis and a rightward disc protrusion at L4-5 without significant stenosis. 4. Mild broad-based disc protrusion and facet hypertrophy at L3-4 without significant stenosis. Electronically Signed   By: Marin Roberts M.D.   On: 02/26/2016 19:44   Dg Abd Acute W/chest  02/19/2016  CLINICAL DATA:  55 year old female with abdominal pain EXAM: DG ABDOMEN ACUTE W/ 1V CHEST COMPARISON:  The CT dated 12/18/2015 FINDINGS:  The lungs are clear. No pleural effusion or pneumothorax. The cardiac silhouette is within normal limits. There is no evidence of bowel obstruction or free air. No radiopaque calculi identified. IMPRESSION: No radiopaque foreign object. There is degenerative changes of the osseous structures. No acute fracture. Electronically Signed   By: Elgie Collard M.D.   On: 02/19/2016 23:42    Leroy Sea, MD  [facility] Pager:[phone]  If 7PM-7AM, please contact night-coverage [email or web address] Password [email or web address] 03/08/2016, 12:00 PM   LOS: 17 days

## 2016-03-08 NOTE — Plan of Care (Addendum)
     Rhonda GillesMichelle Mitchell was admitted to the Hospital on 02/19/2016 and should be excused from her court date on 03-09-16 as [facility] and was discharged in the morning of 03-09-16..  Call Susa RaringPrashant Deitrich Steve MD, [facility]  [phone] with questions.  Leroy SeaSINGH,Keon Pender K M.D on 03/09/2016,at 12:42 PM  [facility]   Office  [phone]

## 2016-03-09 DIAGNOSIS — F1024 Alcohol dependence with alcohol-induced mood disorder: Secondary | ICD-10-CM

## 2016-03-09 MED ORDER — CLONAZEPAM 0.5 MG PO TABS: 1.0000 mg | ORAL_TABLET | Freq: Every day | ORAL | Status: DC

## 2016-03-09 MED ORDER — CLONAZEPAM 0.5 MG PO TABS: 0.5000 mg | ORAL_TABLET | Freq: Every day | ORAL | Status: DC

## 2016-03-09 NOTE — Clinical Social Work Note (Signed)
CSW met with patient at bedside. Patient is refusing SNF. Patient will discharge home. CSW signing off.   Liz Beach MSW, [place], [place], [phone]

## 2016-03-09 NOTE — Care Management Note (Addendum)
Case Management Note  Patient Details  Name: Sherrilee GillesMichelle Hund MRN: [id number] Date of Birth: [birth year]  Subjective/Objective:                    Action/Plan:  Patient ambulating in hallway . Patient states she is going home and not to SNF .  Needs walker , same ordered . Confirmed face sheet information with patient . Patient states Fayrene FearingJames is out of town and she has no one else to drive her home .  Patient does have house key. Will check with SW on transportation .   Patient uninsured [facility] follows Medicaid guidelines to provide PT , patient does not have Medicaid qualifying DX for HHPT.  [facility] has had patient in past , HHRN made 2 visits than were unable to contact patient . [facility] willing to take referral. Expected Discharge Date:                  Expected Discharge Plan:  Home w Home Health Services  In-House Referral:     Discharge planning Services  CM Consult  Post Acute Care Choice:  Home Health Choice offered to:  Patient  DME Arranged:  Walker rolling DME Agency:  [facility]  HH Arranged:  RN, OT, Nurse's Aide, Social Work [facility] [facility]  [facility]  Status of Service:  Completed, signed off  Medicare Important Message Given:    Date Medicare IM Given:    Medicare IM give by:    Date Additional Medicare IM Given:    Additional Medicare Important Message give by:     If discussed at Long Length of Stay Meetings, dates discussed:    Additional Comments:  Kingsley PlanWile, Rozelia Catapano Marie, RN 03/09/2016, 11:35 AM

## 2016-03-09 NOTE — Progress Notes (Signed)
Instructed to remain in bed for 30 minutes.  Primary RN sitting at bedside going over discharge instructions with her.

## 2016-03-09 NOTE — Discharge Instructions (Signed)
Follow with Primary MD  in 7 days   Get CBC, CMP, 2 view Chest X ray checked  by Primary MD next visit.    Activity: As tolerated with Full fall precautions use walker/cane & assistance as needed   Disposition SNF vs HHPT   Diet:   Heart Healthy    For Heart failure patients - Check your Weight same time everyday, if you gain over 2 pounds, or you develop in leg swelling, experience more shortness of breath or chest pain, call your Primary MD immediately. Follow Cardiac Low Salt Diet and 1.5 lit/day fluid restriction.   On your next visit with your primary care physician please Get Medicines reviewed and adjusted.   Please request your Prim.MD to go over all Hospital Tests and Procedure/Radiological results at the follow up, please get all Hospital records sent to your Prim MD by signing hospital release before you go home.   If you experience worsening of your admission symptoms, develop shortness of breath, life threatening emergency, suicidal or homicidal thoughts you must seek medical attention immediately by calling 911 or calling your MD immediately  if symptoms less severe.  You Must read complete instructions/literature along with all the possible adverse reactions/side effects for all the Medicines you take and that have been prescribed to you. Take any new Medicines after you have completely understood and accpet all the possible adverse reactions/side effects.   Do not drive, operating heavy machinery, perform activities at heights, swimming or participation in water activities or provide baby sitting services if your were admitted for syncope or siezures until you have seen by Primary MD or a Neurologist and advised to do so again.  Do not drive when taking Pain medications.    Do not take more than prescribed Pain, Sleep and Anxiety Medications  Special Instructions: If you have smoked or chewed Tobacco  in the last 2 yrs please stop smoking, stop any regular Alcohol   and or any Recreational drug use.  Wear Seat belts while driving.   Please note  You were cared for by a hospitalist during your hospital stay. If you have any questions about your discharge medications or the care you received while you were in the hospital after you are discharged, you can call the unit and asked to speak with the hospitalist on call if the hospitalist that took care of you is not available. Once you are discharged, your primary care physician will handle any further medical issues. Please note that NO REFILLS for any discharge medications will be authorized once you are discharged, as it is imperative that you return to your primary care physician (or establish a relationship with a primary care physician if you do not have one) for your aftercare needs so that they can reassess your need for medications and monitor your lab values.

## 2016-03-09 NOTE — Progress Notes (Signed)
Physical Therapy Treatment Patient Details Name: Rhonda Mitchell MRN: [id number] DOB: Jan 05, 1961 Today's Date: 03/09/2016    History of Present Illness Rhonda Mitchell is a 55 y.o. female with a history of ETOH Abuse, COPD, Hep C admitted with burning Epigastric ABD Pain and SOB, and Nausea Vomiting and Diarrhea.She drinks 3-4 pints of liquor daily and has done so for many years . Her Alcohol level was found at 298. She has a UTI and placed on ETOH withdrawal protocol    PT Comments    Patient demonstrated improved balance, less impulsivity, and an increased awareness of balance deficits this session. Pt is most likely at baseline cognitively. Recommending pt d/c home with HHPT to maximize safety and independence with mobility.   Follow Up Recommendations  Home health PT     Equipment Recommendations  Rolling walker with 5" wheels    Recommendations for Other Services       Precautions / Restrictions Precautions Precautions: Fall Precaution Comments: significant fall risk Restrictions Weight Bearing Restrictions: No    Mobility  Bed Mobility               General bed mobility comments: OOB in chair upon arrival  Transfers Overall transfer level: Needs assistance Equipment used: None Transfers: Sit to/from Stand Sit to Stand: Independent            Ambulation/Gait Ambulation/Gait assistance: Supervision Ambulation Distance (Feet): 250 Feet Assistive device: None Gait Pattern/deviations: Wide base of support;Trunk flexed     General Gait Details: supervision for safety; pt with improved gait and balance this sessin with less impulsivity noted   Stairs Stairs: Yes Stairs assistance: Min guard Stair Management: One rail Right;Forwards Number of Stairs: 10 General stair comments: educated on technique and sequencing with encouragement to use step to pattern and slow down to ensure proper foot placement, balance, and energy conservation; pt c/o slight  SOB after ascending stairs and educated on pursed lip breathing technique  Wheelchair Mobility    Modified Rankin (Stroke Patients Only)       Balance   Sitting-balance support: Feet supported;No upper extremity supported Sitting balance-Leahy Scale: Good     Standing balance support: No upper extremity supported;During functional activity Standing balance-Leahy Scale: Good               High level balance activites: Side stepping;Backward walking;Direction changes;Head turns;Turns;Other (comment) (reaching outside and across BOS with contralateral UE)      Cognition Arousal/Alertness: Awake/alert Behavior During Therapy: Restless;Agitated;Anxious Overall Cognitive Status: No family/caregiver present to determine baseline cognitive functioning (likely at baseline) Area of Impairment: Attention;Memory;Following commands;Safety/judgement;Awareness;Problem solving   Current Attention Level: Selective   Following Commands: Follows one step commands consistently   Awareness: Intellectual Problem Solving: Slow processing      Exercises      General Comments General comments (skin integrity, edema, etc.): pt a little unsteady with sidestepping and backward stepping but with no LOB; pt demonstrated improved awareness of deficits this session      Pertinent Vitals/Pain Pain Assessment: No/denies pain Pain Intervention(s): Monitored during session    Home Living                      Prior Function            PT Goals (current goals can now be found in the care plan section) Acute Rehab PT Goals Patient Stated Goal: go home to her horses Progress towards PT goals: Progressing toward goals  Frequency  Min 3X/week    PT Plan Discharge plan needs to be updated    Co-evaluation             End of Session Equipment Utilized During Treatment: Gait belt Activity Tolerance: Patient tolerated treatment well Patient left: in chair;with call  bell/phone within reach     Time: [id number] PT Time Calculation (min) (ACUTE ONLY): 16 min  Charges:  $Gait Training: 8-22 mins                    G Codes:      Derek MoundKellyn R Annica Marinello Thad Osoria, PTA Pager: [phone]   03/09/2016, 12:41 PM

## 2016-04-20 ENCOUNTER — Emergency Department (HOSPITAL_COMMUNITY): Payer: Medicaid Other

## 2016-04-20 ENCOUNTER — Encounter (HOSPITAL_COMMUNITY): Payer: Self-pay

## 2016-04-20 ENCOUNTER — Inpatient Hospital Stay (HOSPITAL_COMMUNITY)
Admission: EM | Admit: 2016-04-20 | Discharge: 2016-05-12 | DRG: 071 | Disposition: A | Discharge status: Home / Self Care | Payer: Medicaid Other | Attending: Internal Medicine | Admitting: Internal Medicine

## 2016-04-20 DIAGNOSIS — F13239 Sedative, hypnotic or anxiolytic dependence with withdrawal, unspecified: Secondary | ICD-10-CM | POA: Diagnosis present

## 2016-04-20 DIAGNOSIS — Z8614 Personal history of Methicillin resistant Staphylococcus aureus infection: Secondary | ICD-10-CM

## 2016-04-20 DIAGNOSIS — Z79899 Other long term (current) drug therapy: Secondary | ICD-10-CM

## 2016-04-20 DIAGNOSIS — Z9119 Patient's noncompliance with other medical treatment and regimen: Secondary | ICD-10-CM

## 2016-04-20 DIAGNOSIS — R109 Unspecified abdominal pain: Secondary | ICD-10-CM | POA: Diagnosis not present

## 2016-04-20 DIAGNOSIS — R4587 Impulsiveness: Secondary | ICD-10-CM | POA: Diagnosis present

## 2016-04-20 DIAGNOSIS — M1990 Unspecified osteoarthritis, unspecified site: Secondary | ICD-10-CM | POA: Diagnosis present

## 2016-04-20 DIAGNOSIS — J432 Centrilobular emphysema: Secondary | ICD-10-CM | POA: Insufficient documentation

## 2016-04-20 DIAGNOSIS — R410 Disorientation, unspecified: Secondary | ICD-10-CM | POA: Diagnosis present

## 2016-04-20 DIAGNOSIS — R197 Diarrhea, unspecified: Secondary | ICD-10-CM

## 2016-04-20 DIAGNOSIS — E872 Acidosis: Secondary | ICD-10-CM | POA: Diagnosis present

## 2016-04-20 DIAGNOSIS — R000 Tachycardia, unspecified: Secondary | ICD-10-CM | POA: Diagnosis present

## 2016-04-20 DIAGNOSIS — G9340 Encephalopathy, unspecified: Principal | ICD-10-CM

## 2016-04-20 DIAGNOSIS — F10939 Alcohol use, unspecified with withdrawal, unspecified: Secondary | ICD-10-CM | POA: Diagnosis present

## 2016-04-20 DIAGNOSIS — B182 Chronic viral hepatitis C: Secondary | ICD-10-CM | POA: Diagnosis present

## 2016-04-20 DIAGNOSIS — R1110 Vomiting, unspecified: Secondary | ICD-10-CM

## 2016-04-20 DIAGNOSIS — R7989 Other specified abnormal findings of blood chemistry: Secondary | ICD-10-CM | POA: Diagnosis not present

## 2016-04-20 DIAGNOSIS — K7010 Alcoholic hepatitis without ascites: Secondary | ICD-10-CM | POA: Diagnosis present

## 2016-04-20 DIAGNOSIS — F10230 Alcohol dependence with withdrawal, uncomplicated: Secondary | ICD-10-CM | POA: Diagnosis not present

## 2016-04-20 DIAGNOSIS — F17210 Nicotine dependence, cigarettes, uncomplicated: Secondary | ICD-10-CM | POA: Diagnosis present

## 2016-04-20 DIAGNOSIS — F10231 Alcohol dependence with withdrawal delirium: Secondary | ICD-10-CM | POA: Diagnosis present

## 2016-04-20 DIAGNOSIS — R0902 Hypoxemia: Secondary | ICD-10-CM

## 2016-04-20 DIAGNOSIS — G312 Degeneration of nervous system due to alcohol: Secondary | ICD-10-CM | POA: Diagnosis present

## 2016-04-20 DIAGNOSIS — F22 Delusional disorders: Secondary | ICD-10-CM | POA: Diagnosis present

## 2016-04-20 DIAGNOSIS — F10930 Alcohol use, unspecified with withdrawal, uncomplicated: Secondary | ICD-10-CM

## 2016-04-20 DIAGNOSIS — R945 Abnormal results of liver function studies: Secondary | ICD-10-CM

## 2016-04-20 DIAGNOSIS — D638 Anemia in other chronic diseases classified elsewhere: Secondary | ICD-10-CM | POA: Diagnosis present

## 2016-04-20 DIAGNOSIS — D696 Thrombocytopenia, unspecified: Secondary | ICD-10-CM | POA: Diagnosis not present

## 2016-04-20 DIAGNOSIS — F323 Major depressive disorder, single episode, severe with psychotic features: Secondary | ICD-10-CM | POA: Diagnosis present

## 2016-04-20 DIAGNOSIS — I959 Hypotension, unspecified: Secondary | ICD-10-CM | POA: Diagnosis not present

## 2016-04-20 DIAGNOSIS — J449 Chronic obstructive pulmonary disease, unspecified: Secondary | ICD-10-CM | POA: Diagnosis present

## 2016-04-20 DIAGNOSIS — Z978 Presence of other specified devices: Secondary | ICD-10-CM

## 2016-04-20 DIAGNOSIS — G4700 Insomnia, unspecified: Secondary | ICD-10-CM | POA: Diagnosis present

## 2016-04-20 DIAGNOSIS — F10239 Alcohol dependence with withdrawal, unspecified: Secondary | ICD-10-CM | POA: Diagnosis present

## 2016-04-20 DIAGNOSIS — F419 Anxiety disorder, unspecified: Secondary | ICD-10-CM | POA: Diagnosis present

## 2016-04-20 DIAGNOSIS — K219 Gastro-esophageal reflux disease without esophagitis: Secondary | ICD-10-CM | POA: Diagnosis present

## 2016-04-20 DIAGNOSIS — Z781 Physical restraint status: Secondary | ICD-10-CM

## 2016-04-20 LAB — COMPREHENSIVE METABOLIC PANEL
ALBUMIN: 4.1 g/dL (ref 3.5–5.0)
ALK PHOS: 68 U/L (ref 38–126)
ALT: 51 U/L (ref 14–54)
ANION GAP: 23 — AB (ref 5–15)
AST: 176 U/L — ABNORMAL HIGH (ref 15–41)
BUN: 18 mg/dL (ref 6–20)
CALCIUM: 8.1 mg/dL — AB (ref 8.9–10.3)
CHLORIDE: 98 mmol/L — AB (ref 101–111)
CO2: 16 mmol/L — AB (ref 22–32)
Creatinine, Ser: 0.85 mg/dL (ref 0.44–1.00)
GFR calc non Af Amer: >60 mL/min (ref >60)
Glucose, Bld: 78 mg/dL (ref 65–99)
POTASSIUM: 5.2 mmol/L — AB (ref 3.5–5.1)
SODIUM: 137 mmol/L (ref 135–145)
Total Bilirubin: 1.2 mg/dL (ref 0.3–1.2)
Total Protein: 7.8 g/dL (ref 6.5–8.1)

## 2016-04-20 LAB — CBC
HCT: 29.8 % — ABNORMAL LOW (ref 36.0–46.0)
Hemoglobin: 9.3 g/dL — ABNORMAL LOW (ref 12.0–15.0)
MCH: 29 pg (ref 26.0–34.0)
MCHC: 31.2 g/dL (ref 30.0–36.0)
MCV: 92.8 fL (ref 78.0–100.0)
PLATELETS: 179 10*3/uL (ref 150–400)
RBC: 3.21 MIL/uL — ABNORMAL LOW (ref 3.87–5.11)
RDW: 19.3 % — AB (ref 11.5–15.5)
WBC: 7.8 10*3/uL (ref 4.0–10.5)

## 2016-04-20 LAB — RAPID URINE DRUG SCREEN, HOSP PERFORMED
Amphetamines: NOT DETECTED
BARBITURATES: NOT DETECTED
Benzodiazepines: POSITIVE — AB
COCAINE: NOT DETECTED
Opiates: POSITIVE — AB
Tetrahydrocannabinol: NOT DETECTED

## 2016-04-20 LAB — I-STAT TROPONIN, ED: Troponin i, poc: 0 ng/mL (ref 0.00–0.08)

## 2016-04-20 LAB — URINALYSIS, ROUTINE W REFLEX MICROSCOPIC
Bilirubin Urine: NEGATIVE
GLUCOSE, UA: NEGATIVE mg/dL
Hgb urine dipstick: NEGATIVE
Ketones, ur: >80 mg/dL — AB
LEUKOCYTES UA: NEGATIVE
Nitrite: NEGATIVE
Protein, ur: NEGATIVE mg/dL
SPECIFIC GRAVITY, URINE: 1.019 (ref 1.005–1.030)
pH: 6 (ref 5.0–8.0)

## 2016-04-20 LAB — TSH: TSH: 2.821 u[IU]/mL (ref 0.350–4.500)

## 2016-04-20 LAB — PROTIME-INR
INR: 1.01 (ref 0.00–1.49)
Prothrombin Time: 13.5 seconds (ref 11.6–15.2)

## 2016-04-20 LAB — ETHANOL: ALCOHOL ETHYL (B): 123 mg/dL — AB (ref <5)

## 2016-04-20 LAB — LIPASE, BLOOD: LIPASE: 42 U/L (ref 11–51)

## 2016-04-20 MED ORDER — LORAZEPAM 2 MG/ML IJ SOLN
1.0000 mg | Freq: Once | INTRAMUSCULAR | Status: AC
  Administered 2016-04-20: 1 mg via INTRAMUSCULAR

## 2016-04-20 MED ORDER — LORAZEPAM 1 MG PO TABS: 1.0000 mg | ORAL_TABLET | Freq: Four times a day (QID) | ORAL | Status: DC | PRN

## 2016-04-20 MED ORDER — METOPROLOL TARTRATE 5 MG/5ML IV SOLN: 10.0000 mg | Freq: Four times a day (QID) | INTRAVENOUS | Status: DC | PRN

## 2016-04-20 MED ORDER — LORAZEPAM 2 MG/ML IJ SOLN
1.0000 mg | Freq: Once | INTRAMUSCULAR | Status: AC
  Administered 2016-04-20: 1 mg via INTRAVENOUS
  Filled 2016-04-20: qty 1

## 2016-04-20 MED ORDER — SODIUM CHLORIDE 0.9 % IV BOLUS (SEPSIS)
1000.0000 mL | Freq: Once | INTRAVENOUS | Status: AC
  Administered 2016-04-20: 1000 mL via INTRAVENOUS

## 2016-04-20 MED ORDER — GI COCKTAIL ~~LOC~~
30.0000 mL | Freq: Three times a day (TID) | ORAL | Status: DC | PRN
Start: 2016-04-20 — End: 2016-05-12
  Administered 2016-04-21 – 2016-05-09 (×5): 30 mL via ORAL
  Filled 2016-04-20 (×7): qty 30

## 2016-04-20 MED ORDER — ONDANSETRON HCL 4 MG/2ML IJ SOLN
4.0000 mg | Freq: Once | INTRAMUSCULAR | Status: AC
Start: 2016-04-20 — End: 2016-04-20
  Administered 2016-04-20: 4 mg via INTRAVENOUS
  Filled 2016-04-20: qty 2

## 2016-04-20 MED ORDER — OXYCODONE-ACETAMINOPHEN 5-325 MG PO TABS
1.0000 | ORAL_TABLET | Freq: Once | ORAL | Status: AC
  Administered 2016-04-21: 1 via ORAL
  Filled 2016-04-20: qty 1

## 2016-04-20 MED ORDER — LORAZEPAM 2 MG/ML IJ SOLN
1.0000 mg | Freq: Once | INTRAMUSCULAR | Status: DC
  Filled 2016-04-20: qty 1

## 2016-04-20 MED ORDER — KETOROLAC TROMETHAMINE 30 MG/ML IJ SOLN
30.0000 mg | Freq: Four times a day (QID) | INTRAMUSCULAR | Status: DC | PRN
  Administered 2016-04-20 – 2016-04-22 (×7): 30 mg via INTRAVENOUS
  Filled 2016-04-20 (×7): qty 1

## 2016-04-20 MED ORDER — LORAZEPAM 2 MG/ML IJ SOLN
0.0000 mg | Freq: Four times a day (QID) | INTRAMUSCULAR | Status: AC
  Administered 2016-04-20: 4 mg via INTRAVENOUS
  Administered 2016-04-21: 2 mg via INTRAVENOUS
  Administered 2016-04-21: 4 mg via INTRAVENOUS
  Administered 2016-04-21: 1 mg via INTRAVENOUS
  Administered 2016-04-21 – 2016-04-22 (×4): 2 mg via INTRAVENOUS
  Filled 2016-04-20: qty 2
  Filled 2016-04-20 (×4): qty 1
  Filled 2016-04-20: qty 2
  Filled 2016-04-20: qty 1

## 2016-04-20 MED ORDER — ONDANSETRON HCL 4 MG/2ML IJ SOLN
INTRAMUSCULAR | Status: AC
  Filled 2016-04-20: qty 2

## 2016-04-20 MED ORDER — HEPARIN SODIUM (PORCINE) 5000 UNIT/ML IJ SOLN
5000.0000 [IU] | Freq: Three times a day (TID) | INTRAMUSCULAR | Status: DC
  Administered 2016-04-20 – 2016-04-21 (×3): 5000 [IU] via SUBCUTANEOUS
  Filled 2016-04-20 (×6): qty 1

## 2016-04-20 MED ORDER — NICOTINE 14 MG/24HR TD PT24
14.0000 mg | MEDICATED_PATCH | Freq: Every day | TRANSDERMAL | Status: DC
  Administered 2016-04-20 – 2016-05-02 (×13): 14 mg via TRANSDERMAL
  Filled 2016-04-20 (×14): qty 1

## 2016-04-20 MED ORDER — VITAMIN B-1 100 MG PO TABS
100.0000 mg | ORAL_TABLET | Freq: Every day | ORAL | Status: DC
  Administered 2016-04-21 – 2016-05-12 (×19): 100 mg via ORAL
  Filled 2016-04-20 (×21): qty 1

## 2016-04-20 MED ORDER — HYDROCODONE-ACETAMINOPHEN 5-325 MG PO TABS
1.0000 | ORAL_TABLET | ORAL | Status: DC | PRN
  Administered 2016-04-20 – 2016-04-21 (×2): 2 via ORAL
  Filled 2016-04-20 (×2): qty 2

## 2016-04-20 MED ORDER — THIAMINE HCL 100 MG/ML IJ SOLN
100.0000 mg | Freq: Every day | INTRAMUSCULAR | Status: DC
  Administered 2016-04-20: 100 mg via INTRAVENOUS
  Filled 2016-04-20: qty 2

## 2016-04-20 MED ORDER — CYCLOBENZAPRINE HCL 10 MG PO TABS
10.0000 mg | ORAL_TABLET | Freq: Once | ORAL | Status: AC
  Administered 2016-04-21: 10 mg via ORAL
  Filled 2016-04-20: qty 1

## 2016-04-20 MED ORDER — MORPHINE SULFATE (PF) 4 MG/ML IV SOLN
4.0000 mg | Freq: Once | INTRAVENOUS | Status: AC
  Administered 2016-04-20: 4 mg via INTRAVENOUS
  Filled 2016-04-20: qty 1

## 2016-04-20 MED ORDER — ENSURE ENLIVE PO LIQD
237.0000 mL | Freq: Two times a day (BID) | ORAL | Status: DC
  Administered 2016-04-21 – 2016-05-12 (×33): 237 mL via ORAL

## 2016-04-20 MED ORDER — PANTOPRAZOLE SODIUM 40 MG PO TBEC
40.0000 mg | DELAYED_RELEASE_TABLET | Freq: Two times a day (BID) | ORAL | Status: DC
  Administered 2016-04-21 – 2016-05-07 (×27): 40 mg via ORAL
  Filled 2016-04-20 (×34): qty 1

## 2016-04-20 MED ORDER — LORAZEPAM 2 MG/ML IJ SOLN: 0.0000 mg | Freq: Two times a day (BID) | INTRAMUSCULAR | Status: DC

## 2016-04-20 MED ORDER — SODIUM CHLORIDE 0.9 % IV SOLN
INTRAVENOUS | Status: DC
  Administered 2016-04-20: 1 mL via INTRAVENOUS

## 2016-04-20 MED ORDER — ADULT MULTIVITAMIN W/MINERALS CH
1.0000 | ORAL_TABLET | Freq: Every day | ORAL | Status: DC
  Administered 2016-04-20 – 2016-05-12 (×20): 1 via ORAL
  Filled 2016-04-20 (×22): qty 1

## 2016-04-20 MED ORDER — ONDANSETRON HCL 4 MG/2ML IJ SOLN
4.0000 mg | Freq: Once | INTRAMUSCULAR | Status: AC
  Administered 2016-04-20: 4 mg via INTRAVENOUS

## 2016-04-20 MED ORDER — THIAMINE HCL 100 MG/ML IJ SOLN
100.0000 mg | Freq: Every day | INTRAMUSCULAR | Status: DC
  Administered 2016-04-22 – 2016-04-27 (×2): 100 mg via INTRAVENOUS
  Filled 2016-04-20: qty 2
  Filled 2016-04-20 (×4): qty 1

## 2016-04-20 MED ORDER — ACETAMINOPHEN 325 MG PO TABS
650.0000 mg | ORAL_TABLET | Freq: Four times a day (QID) | ORAL | Status: DC | PRN
  Administered 2016-04-20: 650 mg via ORAL
  Filled 2016-04-20: qty 2

## 2016-04-20 MED ORDER — SODIUM CHLORIDE 0.9% FLUSH
3.0000 mL | Freq: Two times a day (BID) | INTRAVENOUS | Status: DC
  Administered 2016-04-20 – 2016-05-09 (×21): 3 mL via INTRAVENOUS

## 2016-04-20 MED ORDER — LORAZEPAM 2 MG/ML IJ SOLN
1.0000 mg | Freq: Four times a day (QID) | INTRAMUSCULAR | Status: DC | PRN
  Administered 2016-04-21 – 2016-04-22 (×3): 1 mg via INTRAVENOUS
  Filled 2016-04-20 (×5): qty 1

## 2016-04-20 MED ORDER — THIAMINE HCL 100 MG/ML IJ SOLN
Freq: Once | INTRAVENOUS | Status: AC
  Administered 2016-04-20: 21:00:00 via INTRAVENOUS
  Filled 2016-04-20: qty 1000

## 2016-04-20 MED ORDER — THIAMINE HCL 100 MG/ML IJ SOLN
Freq: Once | INTRAVENOUS | Status: AC
  Administered 2016-04-20: 16:00:00 via INTRAVENOUS
  Filled 2016-04-20: qty 1000

## 2016-04-20 MED ORDER — FOLIC ACID 1 MG PO TABS
1.0000 mg | ORAL_TABLET | Freq: Every day | ORAL | Status: DC
  Administered 2016-04-20 – 2016-05-12 (×20): 1 mg via ORAL
  Filled 2016-04-20 (×22): qty 1

## 2016-04-20 MED ORDER — ONDANSETRON HCL 4 MG/2ML IJ SOLN
4.0000 mg | Freq: Four times a day (QID) | INTRAMUSCULAR | Status: DC | PRN
  Administered 2016-04-20 – 2016-05-05 (×5): 4 mg via INTRAVENOUS
  Filled 2016-04-20 (×5): qty 2

## 2016-04-20 MED ORDER — ONDANSETRON HCL 4 MG PO TABS: 4.0000 mg | ORAL_TABLET | Freq: Four times a day (QID) | ORAL | Status: DC | PRN

## 2016-04-20 MED ORDER — ACETAMINOPHEN 650 MG RE SUPP: 650.0000 mg | Freq: Four times a day (QID) | RECTAL | Status: DC | PRN

## 2016-04-20 NOTE — ED Notes (Signed)
2 IV start and blood draw attempts

## 2016-04-20 NOTE — ED Notes (Signed)
Pt assisted to bathroom for urine sample by RN.  Pt missed urine hat completely.

## 2016-04-20 NOTE — ED Notes (Signed)
Additional ethanol tube drawn by RN and sent to lab.  RN called lab to verify white label on tube was acceptable.

## 2016-04-20 NOTE — ED Notes (Signed)
Pt given water, sandwich and applesauce with PA approval

## 2016-04-20 NOTE — ED Notes (Signed)
Pt found in bed with EKG leads ripped off and on the floor.  Pt refused to allow RN to replace them.

## 2016-04-20 NOTE — ED Notes (Signed)
Per EMS- Patient c/o abdominal pain and her "heart hurts." EKG- NSR. Patient drinks Vodka daily. Patient drank 4 pints of liquor the last drink was 0400 today. Patient has home O2 3L/min and is wearing now.

## 2016-04-20 NOTE — H&P (Signed)
History and Physical    Rhonda Mitchell WGN:[id number] DOB: 1961/09/17 DOA: 04/20/2016  Referring MD/NP/PA: Marlise Eves PCP: No primary care provider on file. Outpatient Specialists: None Patient coming from: Home  Chief Complaint: Epigastric pain and shakes  HPI: Rhonda Mitchell is a 55 y.o. female with medical history significant of alcohol abuse, COPD and hep C who presented to the ED because of epigastric abdominal pain, nausea and shakes. Patient is noncompliant with medical advises, showed up to the emergency department 8 times in the past 6 months of 4 time she was admitted. She was recently in the hospital from March 1 till March 20 for alcohol detoxification which required drug-induced coma. Reported 2 days after discharge from the hospital she started to drink alcohol. Reports she drinks 2 pints per day, she just decided yesterday she is sick and tired of drinking and she wants to quit so she drank about 3 in the morning and came into the ED seeking detoxification.  ED Course:  CXR negative, slight acidosis with bicarbonate of 16, AST is 167 and normal ALT. Anemic with hemoglobin of 9.3 but not far from her baseline of 9.7. Alcohol level is 123 at 1605 hrs.  Review of Systems:  Review of Systems  Constitutional: Negative.  Negative for fever, chills, weight loss and malaise/fatigue.  HENT: Negative for hearing loss.   Eyes: Negative for blurred vision and double vision.  Respiratory: Negative for cough, hemoptysis and sputum production.   Cardiovascular: Negative for chest pain, palpitations, orthopnea and claudication.  Gastrointestinal: Positive for heartburn, nausea, vomiting, abdominal pain and diarrhea. Negative for blood in stool.  Genitourinary: Negative for dysuria and urgency.  Musculoskeletal: Positive for myalgias.  Skin: Negative for itching.  Neurological: Positive for tremors. Negative for dizziness and headaches.  Endo/Heme/Allergies: Does not bruise/bleed easily.    Psychiatric/Behavioral: Negative for depression and suicidal ideas.   Past Medical History  Diagnosis Date  . COPD (chronic obstructive pulmonary disease) (HCC)   . Anxiety   . Shortness of breath   . Arthritis   . GERD (gastroesophageal reflux disease)   . Full dentures   . Insomnia   . Alcohol abuse   . Hepatitis C   . Alcohol abuse   . History of MRSA infection   . History of encephalopathy     Past Surgical History  Procedure Laterality Date  . Arm surgery    . Hand surgery  1990    both rt/lt carpal tunnels  . Shoulder surgery      right and left-age [age]  . Orif ulnar / radial shaft fracture  1990    right-got infection-had total 10 surgeies 1990  . Orif ulnar fracture Left 05/23/2014    Procedure: OPEN REDUCTION INTERNAL FIXATION (ORIF) LEFT ULNAR FRACTURE;  Surgeon: Harvie Junior, MD;  Location: [facility];  Service: Orthopedics;  Laterality: Left;  . I&d extremity Right 05/07/2015    Procedure: IRRIGATION AND DEBRIDEMENT  ELBOW ;  Surgeon: Dominica Severin, MD;  Location: [facility] OR;  Service: Orthopedics;  Laterality: Right;     reports that she has been smoking Cigarettes.  She has a 20 pack-year smoking history. She has never used smokeless tobacco. She reports that she drinks alcohol. She reports that she does not use illicit drugs.  No Known Allergies  Family History  Problem Relation Age of Onset  . Breast cancer Mother    Prior to Admission medications   Medication Sig Start Date End Date Taking? Authorizing Provider  albuterol (PROVENTIL HFA;VENTOLIN HFA) 108 (90 Base) MCG/ACT inhaler Inhale 2 puffs into the lungs every 4 (four) hours as needed for wheezing or shortness of breath. 03/05/16  Yes Shanker Levora Dredge, MD  benztropine (COGENTIN) 0.5 MG tablet Take 1 tablet (0.5 mg total) by mouth 2 (two) times daily. 03/05/16  Yes Shanker Levora Dredge, MD  budesonide-formoterol (SYMBICORT) 160-4.5 MCG/ACT inhaler Inhale 2 puffs into the lungs 2 (two) times  daily. Reported on 02/20/2016 03/05/16  Yes Shanker Levora Dredge, MD  clonazePAM (KLONOPIN) 0.5 MG tablet Take 2 tablets (1 mg total) by mouth at bedtime. 03/09/16  Yes Leroy Sea, MD  DULoxetine (CYMBALTA) 30 MG capsule Take 1 capsule (30 mg total) by mouth daily. 03/05/16  Yes Shanker Levora Dredge, MD  gabapentin (NEURONTIN) 300 MG capsule Take 1 capsule (300 mg total) by mouth 3 (three) times daily. 03/06/16  Yes Shanker Levora Dredge, MD  haloperidol (HALDOL) 2 MG tablet Take 1 tablet (2 mg total) by mouth 3 (three) times daily. 03/06/16  Yes Shanker Levora Dredge, MD  nicotine (NICODERM CQ - DOSED IN MG/24 HOURS) 21 mg/24hr patch Place 1 patch (21 mg total) onto the skin daily. 03/05/16  Yes Shanker Levora Dredge, MD  oxyCODONE (OXY IR/ROXICODONE) 5 MG immediate release tablet Take 1 tablet (5 mg total) by mouth every 6 (six) hours as needed for moderate pain. 03/05/16  Yes Shanker Levora Dredge, MD  thiamine 100 MG tablet Take 1 tablet (100 mg total) by mouth daily. 03/05/16  Yes Shanker Levora Dredge, MD  traZODone (DESYREL) 50 MG tablet Take 1 tablet (50 mg total) by mouth at bedtime. 03/05/16  Yes Shanker Levora Dredge, MD   Physical Exam: Filed Vitals:   04/20/16 1146 04/20/16 1227 04/20/16 1457 04/20/16 1500  BP: 129/86 122/89 166/90 166/90  Pulse: 89 92 101 101  Temp: 97.7 F (36.5 C)     TempSrc: Oral     Resp: 20   18  SpO2: 100%   95%   Constitutional: NAD, calm, comfortable Filed Vitals:   04/20/16 1146 04/20/16 1227 04/20/16 1457 04/20/16 1500  BP: 129/86 122/89 166/90 166/90  Pulse: 89 92 101 101  Temp: 97.7 F (36.5 C)     TempSrc: Oral     Resp: 20   18  SpO2: 100%   95%   Eyes: PERRL, lids and conjunctivae normal ENMT: Mucous membranes are moist. Posterior pharynx clear of any exudate or lesions.Normal dentition.  Neck: normal, supple, no masses, no thyromegaly Respiratory: clear to auscultation bilaterally, no wheezing, no crackles. Normal respiratory effort. No accessory muscle use.    Cardiovascular: Regular rate and rhythm, no murmurs / rubs / gallops. No extremity edema. 2+ pedal pulses. No carotid bruits.  Abdomen: no tenderness, no masses palpated. No hepatosplenomegaly. Bowel sounds positive.  Musculoskeletal: no clubbing / cyanosis. No joint deformity upper and lower extremities. Good ROM, no contractures. Normal muscle tone.  Skin: no rashes, lesions, ulcers. No induration Neurologic: CN 2-12 grossly intact. Sensation intact, DTR normal. Strength 5/5 in all 4.  Psychiatric: Normal judgment and insight. Alert and oriented x 3. Normal mood.    Labs on Admission: I have personally reviewed following labs and imaging studies  CBC:  Recent Labs Lab 04/20/16 1343  WBC 7.8  HGB 9.3*  HCT 29.8*  MCV 92.8  PLT 179   Basic Metabolic Panel:  Recent Labs Lab 04/20/16 1343  NA 137  K 5.2*  CL 98*  CO2 16*  GLUCOSE 78  BUN 18  CREATININE 0.85  CALCIUM 8.1*   GFR: CrCl cannot be calculated (Unknown ideal weight.). Liver Function Tests:  Recent Labs Lab 04/20/16 1343  AST 176*  ALT 51  ALKPHOS 68  BILITOT 1.2  PROT 7.8  ALBUMIN 4.1    Recent Labs Lab 04/20/16 1343  LIPASE 42   No results for input(s): AMMONIA in the last 168 hours. Coagulation Profile: No results for input(s): INR, PROTIME in the last 168 hours. Cardiac Enzymes: No results for input(s): CKTOTAL, CKMB, CKMBINDEX, TROPONINI in the last 168 hours. BNP (last 3 results) No results for input(s): PROBNP in the last 8760 hours. HbA1C: No results for input(s): HGBA1C in the last 72 hours. CBG: No results for input(s): GLUCAP in the last 168 hours. Lipid Profile: No results for input(s): CHOL, HDL, LDLCALC, TRIG, CHOLHDL, LDLDIRECT in the last 72 hours. Thyroid Function Tests: No results for input(s): TSH, T4TOTAL, FREET4, T3FREE, THYROIDAB in the last 72 hours. Anemia Panel: No results for input(s): VITAMINB12, FOLATE, FERRITIN, TIBC, IRON, RETICCTPCT in the last 72  hours. Urine analysis:    Component Value Date/Time   COLORURINE YELLOW 03/02/2016 0824   APPEARANCEUR CLEAR 03/02/2016 0824   LABSPEC 1.008 03/02/2016 0824   PHURINE 7.5 03/02/2016 0824   GLUCOSEU NEGATIVE 03/02/2016 0824   HGBUR NEGATIVE 03/02/2016 0824   BILIRUBINUR NEGATIVE 03/02/2016 0824   KETONESUR NEGATIVE 03/02/2016 0824   PROTEINUR NEGATIVE 03/02/2016 0824   UROBILINOGEN 1.0 08/14/2015 1140   NITRITE NEGATIVE 03/02/2016 0824   LEUKOCYTESUR NEGATIVE 03/02/2016 0824   Sepsis Labs:  Radiological Exams on Admission: Dg Chest 2 View  04/20/2016  CLINICAL DATA:  Chest pain and shortness of breath, chronic EXAM: CHEST  2 VIEW COMPARISON:  February 19, 2016 FINDINGS: There is mild scarring in the lingula. Lungs elsewhere are clear. The heart size and pulmonary vascularity are normal. No adenopathy. No pneumothorax. No bone lesions. IMPRESSION: No edema or consolidation.  Slight scarring in the lingula. Electronically Signed   By: Bretta BangWilliam  Woodruff III M.D.   On: 04/20/2016 13:37    EKG: Independently reviewed.   Assessment/Plan Principal Problem:   Alcohol withdrawal (HCC) Active Problems:   Tachycardia   Chronic hepatitis C without hepatic coma (HCC)   Elevated LFTs   Abdominal pain, vomiting, and diarrhea   Alcohol withdrawal Patient presented to the hospital with shakes, tachycardia heart rate of 101. Please note that patient was discharged from the hospital for detoxification on 03/09/2016. Mild symptoms for now, expect to get worse symptoms within 24-48 hours. CIWA protocol started.  Tachycardia This is likely secondary to alcohol withdrawal Metoprolol as needed.  Abdominal pain This is likely secondary to alcoholic gastritis versus alcoholic hepatitis. LFTs not that elevated so favor gastritis rather than hepatitis. Started on Protonix twice a day and GI cocktail. Avoid narcotics, Toradol as needed.  Elevated LFTs AST >ALT by 2 falls suggesting this is chronic  secondary to alcohol drinking.  Chronic hepatitis C Normal ALT, check a functional capacity of the liver, albumin is normal at 4.1, check INR.   DVT prophylaxis: Subcutaneous heparin Code Status: Full code Family Communication: Discussed with the patient Disposition Plan: observation  Admission status: telemetry/observation  Mus[facility] Florence Rehabilitation CenterELMAHI,Mala Gibbard A MD [facility] Pager [phone]  If 7PM-7AM, please contact night-coverage [email or web address] Password [facility] [email or web address]  04/20/2016, 5:33 PM

## 2016-04-20 NOTE — ED Notes (Signed)
Patient aware we need a urine specimen. IV fluid is running

## 2016-04-20 NOTE — ED Notes (Signed)
Pt reports drinking 4 pints of vodka per day for the last 8 years.  Last drink today was at 0300.
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
## 2016-04-20 NOTE — ED Provider Notes (Signed)
CSN: [id number]     Arrival date & time 04/20/16  1128 History   First MD Initiated Contact with Patient 04/20/16 1240     Chief Complaint  Patient presents with  . Abdominal Pain  . Chest Pain   (Consider location/radiation/quality/duration/timing/severity/associated sxs/prior Treatment) HPI 55 y.o. female with a hx of COPD, ETOH Abuse, Hepatitis C, presents to the Emergency Department today via EMS complaining of chest pain/abdominal pain for several months. Has hx of previous hospital admissions for similar events due to ETOH abuse. States pain is 10/10 and located in center of chest and generalized abdomen. Notes N/V. No Diarrhea. No visual hallucinations. No headaches. No vision changes. Notes ETOH consumption this morning around 0400. Had 4 pints Vodka. Pt endorses 4 pints Vodka per day x 8 years. Requests assistance with Detox today. Denies SI or HI. No other symptoms noted.   Pt admitted in March due to DTs   Past Medical History  Diagnosis Date  . COPD (chronic obstructive pulmonary disease) (HCC)   . Anxiety   . Shortness of breath   . Arthritis   . GERD (gastroesophageal reflux disease)   . Full dentures   . Insomnia   . Alcohol abuse   . Hepatitis C   . Alcohol abuse   . History of MRSA infection   . History of encephalopathy    Past Surgical History  Procedure Laterality Date  . Arm surgery    . Hand surgery  1990    both rt/lt carpal tunnels  . Shoulder surgery      right and left-age [age]  . Orif ulnar / radial shaft fracture  1990    right-got infection-had total 10 surgeies 1990  . Orif ulnar fracture Left 05/23/2014    Procedure: OPEN REDUCTION INTERNAL FIXATION (ORIF) LEFT ULNAR FRACTURE;  Surgeon: Harvie JuniorJohn L Graves, MD;  Location: [facility];  Service: Orthopedics;  Laterality: Left;  . I&d extremity Right 05/07/2015    Procedure: IRRIGATION AND DEBRIDEMENT  ELBOW ;  Surgeon: Dominica SeverinWilliam Gramig, MD;  Location: [facility] OR;  Service: Orthopedics;  Laterality:  Right;   Family History  Problem Relation Age of Onset  . Breast cancer Mother    Social History  Substance Use Topics  . Smoking status: Current Every Day Smoker -- 1.00 packs/day for 20 years    Types: Cigarettes  . Smokeless tobacco: Never Used  . Alcohol Use: Yes     Comment: 1/5th Vodka every day    OB History    No data available     Review of Systems ROS reviewed and all are negative for acute change except as noted in the HPI.  Allergies  Review of patient's allergies indicates no known allergies.  Home Medications   Prior to Admission medications   Medication Sig Start Date End Date Taking? Authorizing Provider  albuterol (PROVENTIL HFA;VENTOLIN HFA) 108 (90 Base) MCG/ACT inhaler Inhale 2 puffs into the lungs every 4 (four) hours as needed for wheezing or shortness of breath. 03/05/16   Shanker Levora DredgeM Ghimire, MD  benztropine (COGENTIN) 0.5 MG tablet Take 1 tablet (0.5 mg total) by mouth 2 (two) times daily. 03/05/16   Shanker Levora DredgeM Ghimire, MD  budesonide-formoterol (SYMBICORT) 160-4.5 MCG/ACT inhaler Inhale 2 puffs into the lungs 2 (two) times daily. Reported on 02/20/2016 03/05/16   Maretta BeesShanker M Ghimire, MD  clonazePAM (KLONOPIN) 0.5 MG tablet Take 2 tablets (1 mg total) by mouth at bedtime. 03/09/16   Leroy SeaPrashant K Singh, MD  DULoxetine (  CYMBALTA) 30 MG capsule Take 1 capsule (30 mg total) by mouth daily. 03/05/16   Shanker Levora Dredge, MD  gabapentin (NEURONTIN) 300 MG capsule Take 1 capsule (300 mg total) by mouth 3 (three) times daily. 03/06/16   Shanker Levora Dredge, MD  haloperidol (HALDOL) 2 MG tablet Take 1 tablet (2 mg total) by mouth 3 (three) times daily. 03/06/16   Shanker Levora Dredge, MD  nicotine (NICODERM CQ - DOSED IN MG/24 HOURS) 21 mg/24hr patch Place 1 patch (21 mg total) onto the skin daily. 03/05/16   Shanker Levora Dredge, MD  oxyCODONE (OXY IR/ROXICODONE) 5 MG immediate release tablet Take 1 tablet (5 mg total) by mouth every 6 (six) hours as needed for moderate pain. 03/05/16    Shanker Levora Dredge, MD  thiamine 100 MG tablet Take 1 tablet (100 mg total) by mouth daily. 03/05/16   Shanker Levora Dredge, MD  traZODone (DESYREL) 50 MG tablet Take 1 tablet (50 mg total) by mouth at bedtime. 03/05/16   Shanker Levora Dredge, MD   BP 122/89 mmHg  Pulse 92  Temp(Src) 97.7 F (36.5 C) (Oral)  Resp 20  SpO2 100%   Physical Exam  Constitutional: She is oriented to person, place, and time. She appears well-developed and well-nourished.  Pt looks more than stated age. Pt looks ill appearing. No diaphoresis.    HENT:  Head: Normocephalic and atraumatic.  Eyes: EOM are normal. Pupils are equal, round, and reactive to light.  Neck: Normal range of motion. Neck supple. No tracheal deviation present.  Cardiovascular: Regular rhythm, S1 normal, S2 normal, normal heart sounds, intact distal pulses and normal pulses.  Tachycardia present.   No murmur heard. Pulmonary/Chest: Effort normal and breath sounds normal. No respiratory distress. She has no wheezes. She has no rales. She exhibits no tenderness.  Abdominal: Soft. Normal appearance and bowel sounds are normal. There is generalized tenderness.  Musculoskeletal: Normal range of motion.  Neurological: She is alert and oriented to person, place, and time. She has normal strength. No cranial nerve deficit or sensory deficit.  The patient with tremor on exam, there is no asterixis, she follows commands without difficulty, she is not slurring her speech, she does smell of alcohol.   Skin: Skin is warm and dry.  Psychiatric: She has a normal mood and affect. Her behavior is normal. Thought content normal.  Nursing note and vitals reviewed.  ED Course  Procedures (including critical care time) Labs Review Labs Reviewed  CBC - Abnormal; Notable for the following:    RBC 3.21 (*)    Hemoglobin 9.3 (*)    HCT 29.8 (*)    RDW 19.3 (*)    All other components within normal limits  COMPREHENSIVE METABOLIC PANEL - Abnormal; Notable for the  following:    Potassium 5.2 (*)    Chloride 98 (*)    CO2 16 (*)    Calcium 8.1 (*)    AST 176 (*)    Anion gap 23 (*)    All other components within normal limits  URINE CULTURE  LIPASE, BLOOD  ETHANOL  URINE RAPID DRUG SCREEN, HOSP PERFORMED  URINALYSIS, ROUTINE W REFLEX MICROSCOPIC (NOT AT [facility])  Rosezena Sensor, ED   Imaging Review Dg Chest 2 View  04/20/2016  CLINICAL DATA:  Chest pain and shortness of breath, chronic EXAM: CHEST  2 VIEW COMPARISON:  February 19, 2016 FINDINGS: There is mild scarring in the lingula. Lungs elsewhere are clear. The heart size and pulmonary vascularity are  normal. No adenopathy. No pneumothorax. No bone lesions. IMPRESSION: No edema or consolidation.  Slight scarring in the lingula. Electronically Signed   By: Bretta Bang III M.D.   On: 04/20/2016 13:37   I have personally reviewed and evaluated these images and lab results as part of my medical decision-making.   EKG Interpretation   Date/Time:  Monday Apr 20 2016 14:11:00 EDT Ventricular Rate:  94 PR Interval:  139 QRS Duration: 113 QT Interval:  398 QTC Calculation: 498 R Axis:   59 Text Interpretation:  Sinus rhythm Borderline intraventricular conduction  delay Low voltage, precordial leads Minimal ST depression, lateral leads  Borderline prolonged QT interval Baseline wander in lead(s) I III aVL  Confirmed by HAVILAND MD, JULIE ([id number]) on 04/20/2016 2:27:08 PM      MDM  I have reviewed and evaluated the relevant laboratory values I have reviewed and evaluated the relevant imaging studies.  I have interpreted the relevant EKG. I have reviewed the relevant previous healthcare records. I have reviewed EMS Documentation. I obtained HPI from historian. Patient discussed with supervising physician  ED Course:  Assessment: Pt is a 54yF with hx  COPD, ETOH Abuse, Hepatitis C, who presents with chest pain/abdominal pain x several months. Has been in ED multiple times for detox. Last  admission due to DTs. Requests Detox today. On exam, pt is tremulous with no asterixis. No slurred speech. Smells of ETOH. VS with mild tachycardia. Hypertensive. Afebrile. CIWA 19. Lungs CTA. Abdomen diffusely tender. Given Ativan 1mg  IM. Given Zofran. NS bolus along with Banana bag. iStat Trop negative. EKG unremarkable. Labs unchanged from previous. ETOH level 123. CXR unremarkable.   3:46 PM- CIWA 25 on recheck. Given Ativan      Disposition/Plan:  Admit to Obs Pt acknowledges and agrees with plan  Supervising Physician Jacalyn Lefevre, MD   Final diagnoses:  Alcohol withdrawal, uncomplicated [facility])      Audry Pili, PA-C 04/20/16 1713

## 2016-04-21 DIAGNOSIS — Z781 Physical restraint status: Secondary | ICD-10-CM | POA: Diagnosis not present

## 2016-04-21 DIAGNOSIS — F10231 Alcohol dependence with withdrawal delirium: Secondary | ICD-10-CM | POA: Diagnosis not present

## 2016-04-21 DIAGNOSIS — R7989 Other specified abnormal findings of blood chemistry: Secondary | ICD-10-CM | POA: Diagnosis not present

## 2016-04-21 DIAGNOSIS — F323 Major depressive disorder, single episode, severe with psychotic features: Secondary | ICD-10-CM | POA: Diagnosis present

## 2016-04-21 DIAGNOSIS — G312 Degeneration of nervous system due to alcohol: Secondary | ICD-10-CM | POA: Diagnosis present

## 2016-04-21 DIAGNOSIS — J449 Chronic obstructive pulmonary disease, unspecified: Secondary | ICD-10-CM | POA: Diagnosis present

## 2016-04-21 DIAGNOSIS — J432 Centrilobular emphysema: Secondary | ICD-10-CM | POA: Diagnosis not present

## 2016-04-21 DIAGNOSIS — F10230 Alcohol dependence with withdrawal, uncomplicated: Secondary | ICD-10-CM | POA: Diagnosis not present

## 2016-04-21 DIAGNOSIS — R0902 Hypoxemia: Secondary | ICD-10-CM | POA: Diagnosis not present

## 2016-04-21 DIAGNOSIS — K219 Gastro-esophageal reflux disease without esophagitis: Secondary | ICD-10-CM | POA: Diagnosis present

## 2016-04-21 DIAGNOSIS — D696 Thrombocytopenia, unspecified: Secondary | ICD-10-CM | POA: Diagnosis not present

## 2016-04-21 DIAGNOSIS — E872 Acidosis: Secondary | ICD-10-CM | POA: Diagnosis present

## 2016-04-21 DIAGNOSIS — I959 Hypotension, unspecified: Secondary | ICD-10-CM | POA: Diagnosis not present

## 2016-04-21 DIAGNOSIS — F13239 Sedative, hypnotic or anxiolytic dependence with withdrawal, unspecified: Secondary | ICD-10-CM | POA: Diagnosis present

## 2016-04-21 DIAGNOSIS — Z9119 Patient's noncompliance with other medical treatment and regimen: Secondary | ICD-10-CM | POA: Diagnosis not present

## 2016-04-21 DIAGNOSIS — Z79899 Other long term (current) drug therapy: Secondary | ICD-10-CM | POA: Diagnosis not present

## 2016-04-21 DIAGNOSIS — B182 Chronic viral hepatitis C: Secondary | ICD-10-CM | POA: Diagnosis not present

## 2016-04-21 DIAGNOSIS — R000 Tachycardia, unspecified: Secondary | ICD-10-CM | POA: Diagnosis not present

## 2016-04-21 DIAGNOSIS — R1110 Vomiting, unspecified: Secondary | ICD-10-CM | POA: Diagnosis not present

## 2016-04-21 DIAGNOSIS — F22 Delusional disorders: Secondary | ICD-10-CM | POA: Diagnosis present

## 2016-04-21 DIAGNOSIS — R443 Hallucinations, unspecified: Secondary | ICD-10-CM | POA: Diagnosis not present

## 2016-04-21 DIAGNOSIS — F17210 Nicotine dependence, cigarettes, uncomplicated: Secondary | ICD-10-CM | POA: Diagnosis present

## 2016-04-21 DIAGNOSIS — G9340 Encephalopathy, unspecified: Secondary | ICD-10-CM | POA: Diagnosis present

## 2016-04-21 DIAGNOSIS — K7010 Alcoholic hepatitis without ascites: Secondary | ICD-10-CM | POA: Diagnosis present

## 2016-04-21 DIAGNOSIS — F23 Brief psychotic disorder: Secondary | ICD-10-CM | POA: Diagnosis not present

## 2016-04-21 DIAGNOSIS — F419 Anxiety disorder, unspecified: Secondary | ICD-10-CM | POA: Diagnosis present

## 2016-04-21 DIAGNOSIS — R410 Disorientation, unspecified: Secondary | ICD-10-CM | POA: Diagnosis present

## 2016-04-21 DIAGNOSIS — D638 Anemia in other chronic diseases classified elsewhere: Secondary | ICD-10-CM | POA: Diagnosis present

## 2016-04-21 DIAGNOSIS — G4700 Insomnia, unspecified: Secondary | ICD-10-CM | POA: Diagnosis present

## 2016-04-21 DIAGNOSIS — R4587 Impulsiveness: Secondary | ICD-10-CM | POA: Diagnosis present

## 2016-04-21 DIAGNOSIS — R1013 Epigastric pain: Secondary | ICD-10-CM | POA: Diagnosis present

## 2016-04-21 DIAGNOSIS — R109 Unspecified abdominal pain: Secondary | ICD-10-CM | POA: Diagnosis not present

## 2016-04-21 DIAGNOSIS — Z8614 Personal history of Methicillin resistant Staphylococcus aureus infection: Secondary | ICD-10-CM | POA: Diagnosis not present

## 2016-04-21 DIAGNOSIS — M1990 Unspecified osteoarthritis, unspecified site: Secondary | ICD-10-CM | POA: Diagnosis present

## 2016-04-21 DIAGNOSIS — F1994 Other psychoactive substance use, unspecified with psychoactive substance-induced mood disorder: Secondary | ICD-10-CM | POA: Diagnosis not present

## 2016-04-21 DIAGNOSIS — F10239 Alcohol dependence with withdrawal, unspecified: Secondary | ICD-10-CM | POA: Diagnosis not present

## 2016-04-21 LAB — COMPREHENSIVE METABOLIC PANEL
ALBUMIN: 3.6 g/dL (ref 3.5–5.0)
ALK PHOS: 60 U/L (ref 38–126)
ALT: 40 U/L (ref 14–54)
AST: 92 U/L — AB (ref 15–41)
Anion gap: 12 (ref 5–15)
BILIRUBIN TOTAL: 1.5 mg/dL — AB (ref 0.3–1.2)
BUN: 9 mg/dL (ref 6–20)
CO2: 24 mmol/L (ref 22–32)
CREATININE: 0.58 mg/dL (ref 0.44–1.00)
Calcium: 7.8 mg/dL — ABNORMAL LOW (ref 8.9–10.3)
Chloride: 101 mmol/L (ref 101–111)
GFR calc Af Amer: >60 mL/min (ref >60)
GFR calc non Af Amer: >60 mL/min (ref >60)
GLUCOSE: 88 mg/dL (ref 65–99)
Potassium: 3.6 mmol/L (ref 3.5–5.1)
Sodium: 137 mmol/L (ref 135–145)
TOTAL PROTEIN: 6.9 g/dL (ref 6.5–8.1)

## 2016-04-21 LAB — C DIFFICILE QUICK SCREEN W PCR REFLEX
C Diff antigen: POSITIVE — AB
C Diff toxin: NEGATIVE

## 2016-04-21 LAB — CBC
HCT: 27.8 % — ABNORMAL LOW (ref 36.0–46.0)
Hemoglobin: 8.7 g/dL — ABNORMAL LOW (ref 12.0–15.0)
MCH: 28.3 pg (ref 26.0–34.0)
MCHC: 31.3 g/dL (ref 30.0–36.0)
MCV: 90.6 fL (ref 78.0–100.0)
Platelets: 130 10*3/uL — ABNORMAL LOW (ref 150–400)
RBC: 3.07 MIL/uL — ABNORMAL LOW (ref 3.87–5.11)
RDW: 18.8 % — ABNORMAL HIGH (ref 11.5–15.5)
WBC: 4.1 10*3/uL (ref 4.0–10.5)

## 2016-04-21 MED ORDER — LORAZEPAM 2 MG/ML IJ SOLN
4.0000 mg | Freq: Once | INTRAMUSCULAR | Status: AC
  Administered 2016-04-21: 4 mg via INTRAVENOUS
  Filled 2016-04-21: qty 2

## 2016-04-21 MED ORDER — DIPHENOXYLATE-ATROPINE 2.5-0.025 MG PO TABS
2.0000 | ORAL_TABLET | Freq: Once | ORAL | Status: AC
  Administered 2016-04-21: 2 via ORAL
  Filled 2016-04-21: qty 2

## 2016-04-21 MED ORDER — LORAZEPAM 2 MG/ML IJ SOLN: 1.0000 mg | Freq: Once | INTRAMUSCULAR | Status: DC

## 2016-04-21 MED ORDER — FUROSEMIDE 10 MG/ML IJ SOLN
40.0000 mg | Freq: Once | INTRAMUSCULAR | Status: AC
  Administered 2016-04-21: 40 mg via INTRAVENOUS
  Filled 2016-04-21: qty 4

## 2016-04-21 MED ORDER — POTASSIUM CHLORIDE CRYS ER 20 MEQ PO TBCR
60.0000 meq | EXTENDED_RELEASE_TABLET | Freq: Once | ORAL | Status: AC
  Administered 2016-04-21: 60 meq via ORAL
  Filled 2016-04-21: qty 3

## 2016-04-21 MED ORDER — LORAZEPAM 2 MG/ML IJ SOLN
2.0000 mg | INTRAMUSCULAR | Status: AC
  Administered 2016-04-21: 2 mg via INTRAVENOUS
  Filled 2016-04-21: qty 1

## 2016-04-21 NOTE — Progress Notes (Signed)
PROGRESS NOTE  Rhonda Mitchell  EAV:[id number] DOB: [birth year] DOA: 04/20/2016 PCP: No primary care provider on file. Outpatient Specialists:  Subjective: Sleepy, received Ativan and Norco earlier today.  Brief Narrative:  55 year old female with significant alcohol abuse and hep C came into the hospital complaining about epigastric abdominal pain and that she needs to be detoxified from alcohol, please note she was in [place] for first 20 days of March for the same reason, reported that 2 days after discharge she started to drink back again. She drinks 2 pints of vodka per day  Assessment & Plan:   Principal Problem:   Alcohol withdrawal (HCC) Active Problems:   Tachycardia   Chronic hepatitis C without hepatic coma (HCC)   Elevated LFTs   Abdominal pain, vomiting, and diarrhea   Alcohol withdrawal Patient presented to the hospital with shakes, tachycardia heart rate of 101. Please note that patient was discharged from the hospital for detoxification on 03/09/2016. Mild symptoms for now, expect to get worse symptoms within 24-48 hours. CIWA protocol started, I spoke with the nurse to go more with the objective findings rather than what she reports to her. CIWA protocol was up to 30 with all subjective measures reported at max like anxiety, nausea and headache.  Tachycardia This is likely secondary to alcohol withdrawal Metoprolol as needed. Given IV fluids for tachycardia Patient is sleepy this morning (because of excess of Ativan/Norco), discontinue IV fluids and give Lasix.  Abdominal pain This is likely secondary to alcoholic gastritis versus alcoholic hepatitis. LFTs not that elevated so favor gastritis rather than hepatitis. Started on Protonix twice a day and GI cocktail. Avoid narcotics, Toradol as needed.  Elevated LFTs AST >ALT by 2 folds suggesting this is chronic secondary to alcohol drinking.  Chronic hepatitis C Normal ALT, check a functional capacity of  the liver, albumin is normal at 4.1, check INR.   DVT prophylaxis:  Code Status: Full Code Family Communication:  Disposition Plan:  Diet: Diet regular Room service appropriate?: Yes; Fluid consistency:: Thin  Consultants:   None  Procedures:   None  Antimicrobials:   None   Objective: Filed Vitals:   04/21/16 0010 04/21/16 0636 04/21/16 0638 04/21/16 1100  BP: 164/76 161/79    Pulse: 86 93    Temp: 98.3 F (36.8 C) 98.2 F (36.8 C)    TempSrc: Oral Oral    Resp: 18 18    Height:      Weight:      SpO2: 94% 93% 97% 95%    Intake/Output Summary (Last 24 hours) at 04/21/16 1154 Last data filed at 04/21/16 0700  Gross per 24 hour  Intake   1750 ml  Output    400 ml  Net   1350 ml   Filed Weights   04/20/16 1815  Weight: 52.6 kg (115 lb 15.4 oz)    Examination: General exam: Appears calm and comfortable  Respiratory system: Clear to auscultation. Respiratory effort normal. Cardiovascular system: S1 & S2 heard, RRR. No JVD, murmurs, rubs, gallops or clicks. No pedal edema. Gastrointestinal system: Abdomen is nondistended, soft and nontender. No organomegaly or masses felt. Normal bowel sounds heard. Central nervous system: Alert and oriented. No focal neurological deficits. Extremities: Symmetric 5 x 5 power. Skin: No rashes, lesions or ulcers Psychiatry: Judgement and insight appear normal. Mood & affect appropriate.   Data Reviewed: I have personally reviewed following labs and imaging studies  CBC:  Recent Labs Lab 04/20/16 1343 04/21/16 0805  WBC 7.8 4.1  HGB 9.3* 8.7*  HCT 29.8* 27.8*  MCV 92.8 90.6  PLT 179 130*   Basic Metabolic Panel:  Recent Labs Lab 04/20/16 1343 04/21/16 0805  NA 137 137  K 5.2* 3.6  CL 98* 101  CO2 16* 24  GLUCOSE 78 88  BUN 18 9  CREATININE 0.85 0.58  CALCIUM 8.1* 7.8*   GFR: Estimated Creatinine Clearance: 57.7 mL/min (by C-G formula based on Cr of 0.58). Liver Function Tests:  Recent Labs Lab  04/20/16 1343 04/21/16 0805  AST 176* 92*  ALT 51 40  ALKPHOS 68 60  BILITOT 1.2 1.5*  PROT 7.8 6.9  ALBUMIN 4.1 3.6    Recent Labs Lab 04/20/16 1343  LIPASE 42   No results for input(s): AMMONIA in the last 168 hours. Coagulation Profile:  Recent Labs Lab 04/20/16 2004  INR 1.01   Cardiac Enzymes: No results for input(s): CKTOTAL, CKMB, CKMBINDEX, TROPONINI in the last 168 hours. BNP (last 3 results) No results for input(s): PROBNP in the last 8760 hours. HbA1C: No results for input(s): HGBA1C in the last 72 hours. CBG: No results for input(s): GLUCAP in the last 168 hours. Lipid Profile: No results for input(s): CHOL, HDL, LDLCALC, TRIG, CHOLHDL, LDLDIRECT in the last 72 hours. Thyroid Function Tests:  Recent Labs  04/20/16 2004  TSH 2.821   Anemia Panel: No results for input(s): VITAMINB12, FOLATE, FERRITIN, TIBC, IRON, RETICCTPCT in the last 72 hours. Urine analysis:    Component Value Date/Time   COLORURINE YELLOW 04/20/2016 1741   APPEARANCEUR CLEAR 04/20/2016 1741   LABSPEC 1.019 04/20/2016 1741   PHURINE 6.0 04/20/2016 1741   GLUCOSEU NEGATIVE 04/20/2016 1741   HGBUR NEGATIVE 04/20/2016 1741   BILIRUBINUR NEGATIVE 04/20/2016 1741   KETONESUR >80* 04/20/2016 1741   PROTEINUR NEGATIVE 04/20/2016 1741   UROBILINOGEN 1.0 08/14/2015 1140   NITRITE NEGATIVE 04/20/2016 1741   LEUKOCYTESUR NEGATIVE 04/20/2016 1741   Recent Results (from the past 240 hour(s))  Urine culture     Status: None (Preliminary result)   Collection Time: 04/20/16  5:41 PM  Result Value Ref Range Status   Specimen Description URINE, RANDOM  Final   Special Requests NONE  Final   Culture   Final    NO GROWTH < 12 HOURS Performed at [facility]    Report Status PENDING  Incomplete     Invalid input(s): PROCALCITONIN, LACTICACIDVEN   Radiology Studies: Dg Chest 2 View  04/20/2016  CLINICAL DATA:  Chest pain and shortness of breath, chronic EXAM: CHEST  2 VIEW  COMPARISON:  February 19, 2016 FINDINGS: There is mild scarring in the lingula. Lungs elsewhere are clear. The heart size and pulmonary vascularity are normal. No adenopathy. No pneumothorax. No bone lesions. IMPRESSION: No edema or consolidation.  Slight scarring in the lingula. Electronically Signed   By: Bretta Bang III M.D.   On: 04/20/2016 13:37        Scheduled Meds: . feeding supplement (ENSURE ENLIVE)  237 mL Oral BID BM  . folic acid  1 mg Oral Daily  . heparin  5,000 Units Subcutaneous Q8H  . LORazepam  0-4 mg Intravenous Q6H   Followed by  . [START ON 04/22/2016] LORazepam  0-4 mg Intravenous Q12H  . multivitamin with minerals  1 tablet Oral Daily  . nicotine  14 mg Transdermal Daily  . pantoprazole  40 mg Oral BID AC  . sodium chloride flush  3 mL Intravenous Q12H  . thiamine  100 mg Oral Daily   Or  . thiamine  100 mg Intravenous Daily   Continuous Infusions:       Time spent: 35 minutes    Luella Gardenhire A, MD [facility] Pager [phone]  If 7PM-7AM, please contact night-coverage [email or web address] Password [email or web address] 04/21/2016, 11:54 AM

## 2016-04-21 NOTE — Progress Notes (Signed)
Pt continues to get OOB, does not follow safety commands, pt is unsteady and high risk for fall.  CIWA completed, MD paged. Orders received for waist restraint.  Pt also having frequent loose/watery BM. MD on call made aware. Orders received. Offie Waide A

## 2016-04-21 NOTE — Care Management Obs Status (Signed)
MEDICARE OBSERVATION STATUS NOTIFICATION   Patient Details  Name: Rhonda Mitchell MRN: [id number] Date of Birth: [birth year]   Medicare Observation Status Notification Given:  Yes    MahabirOlegario Messier, Brittany Amirault, RN 04/21/2016, 1:12 PM

## 2016-04-21 NOTE — Progress Notes (Signed)
Initial Nutrition Assessment  DOCUMENTATION CODES:   Not applicable  INTERVENTION:  - Continue to encourage PO intakes as mentation allows  - RD will continue to monitor for needs  NUTRITION DIAGNOSIS:   Inadequate oral intake related to acute illness as evidenced by meal completion < 25%.  GOAL:   Patient will meet greater than or equal to 90% of their needs  MONITOR:   PO intake, Weight trends, Labs, I & O's  REASON FOR ASSESSMENT:   Malnutrition Screening Tool  ASSESSMENT:   55 y.o. female with medical history significant of alcohol abuse, COPD and hep C who presented to the ED because of epigastric abdominal pain, nausea and shakes. Patient is noncompliant with medical advises, showed up to the emergency department 8 times in the past 6 months of 4 time she was admitted. She was recently in the hospital from March 1 till March 20 for alcohol detoxification which required drug-induced coma. Reported 2 days after discharge from the hospital she started to drink alcohol. Reports she drinks 2 pints per day, she just decided yesterday she is sick and tired of drinking and she wants to quit so she drank about 3 in the morning and came into the ED seeking detoxification.  Pt seen for MST. BMI indicates normal weight. No intakes documented since admission and RN reports pt ate a small amount of breakfast but that she is unable to quantify amount that pt ate. Pt is currently sitting up in bed eating a hamburger for lunch. Pt unable to provide significant information from PTA so will obtain all information at follow-up. Pt admitted for alcohol detox and notes indicate pt with withdrawal symptoms (shaking, tachycardia) on admission. Per notes, pt drinks 2 pints of alcohol/day.  Unable to complete physical assessment at this time and will do so at follow-up. Per chart review, pt gained 10 lbs from 11/14/15-12/18/15 and subsequently lost 5 lbs (4% body weight) from 12/18/15-04/20/16 which is not  significant for time frame.  Will continue to monitor for needs and will provide interventions as needed. Medications reviewed. Labs reviewed; Ca: 8.7 mg/dL.   Diet Order:  Diet regular Room service appropriate?: Yes; Fluid consistency:: Thin  Skin:  Reviewed, no issues  Last BM:  PTA  Height:   Ht Readings from Last 1 Encounters:  04/20/16 5' (1.524 m)    Weight:   Wt Readings from Last 1 Encounters:  04/20/16 115 lb 15.4 oz (52.6 kg)    Ideal Body Weight:  45.45 kg (kg)  BMI:  Body mass index is 22.65 kg/(m^2).  Estimated Nutritional Needs:   Kcal:  1315-1580 (25-30 kcal/kg)  Protein:  55-65 grams  Fluid:  >/= 1.5 L/day  EDUCATION NEEDS:   No education needs identified at this time     Trenton GammonJessica Vonita Calloway, RD, LDN Inpatient Clinical Dietitian Pager # [phone] After hours/weekend pager # [phone]

## 2016-04-21 NOTE — Progress Notes (Signed)
Since 7pm, this patient has been very agitated and confused. Patient unable to say right date and place. She is constantly been calling out and jumping out of bed. Waist restraints were put on her during day shift and she was unable to get out of it. On call has been notified 3 times and RN has gotten 2 doses of Ativan ordered for this patient and also on call ordered wrist restraints as well. Patient also had there c. Diff come back negative for the toxin and positive for the antigen, so patient was taken off precautions and an anti diarrheal was ordered for her.

## 2016-04-21 NOTE — Progress Notes (Signed)
Resumed care of patient. Pt is asleep. Agree with previous assessment. Will continue to monitor.

## 2016-04-21 NOTE — Care Management Note (Signed)
Case Management Note  Patient Details  Name: Sherrilee GillesMichelle Duffee MRN: [id number] Date of Birth: 12-May-1961  Subjective/Objective:   55 y/o f admitted w/etoh w/drawal. From home. Referral for help @ home.  Spoke to patient about d/c plans-patient states home is safe, has support,has pcp,pharmacy. Patient did not state being afraid @ home, or that home is not safe.She would like asst w/adl's. Provided her with private duty care agency list([facility].  Patient states she will need transportation @ d/c to home-ambulance transp, confirmed home address on face sheet.                 Action/Plan:d/c plan home.   Expected Discharge Date:   (unknown)               Expected Discharge Plan:  Home/Self Care  In-House Referral:     Discharge planning Services  CM Consult  Post Acute Care Choice:    Choice offered to:     DME Arranged:    DME Agency:     HH Arranged:    HH Agency:     Status of Service:  In process, will continue to follow  Medicare Important Message Given:    Date Medicare IM Given:    Medicare IM give by:    Date Additional Medicare IM Given:    Additional Medicare Important Message give by:     If discussed at Long Length of Stay Meetings, dates discussed:    Additional Comments:  Lanier ClamMahabir, Jonny Longino, RN 04/21/2016, 1:12 PM

## 2016-04-21 NOTE — Plan of Care (Signed)
Problem: Safety: Goal: Ability to remain free from injury will improve Outcome: Not Progressing Pt will not use call bell and jumps out of bed every 2-3 minutes

## 2016-04-22 DIAGNOSIS — B182 Chronic viral hepatitis C: Secondary | ICD-10-CM

## 2016-04-22 DIAGNOSIS — F10230 Alcohol dependence with withdrawal, uncomplicated: Secondary | ICD-10-CM

## 2016-04-22 DIAGNOSIS — R7989 Other specified abnormal findings of blood chemistry: Secondary | ICD-10-CM

## 2016-04-22 DIAGNOSIS — R197 Diarrhea, unspecified: Secondary | ICD-10-CM

## 2016-04-22 DIAGNOSIS — R000 Tachycardia, unspecified: Secondary | ICD-10-CM

## 2016-04-22 DIAGNOSIS — R109 Unspecified abdominal pain: Secondary | ICD-10-CM

## 2016-04-22 DIAGNOSIS — R1110 Vomiting, unspecified: Secondary | ICD-10-CM

## 2016-04-22 LAB — COMPREHENSIVE METABOLIC PANEL
ALT: 38 U/L (ref 14–54)
AST: 77 U/L — AB (ref 15–41)
Albumin: 3.9 g/dL (ref 3.5–5.0)
Alkaline Phosphatase: 70 U/L (ref 38–126)
Anion gap: 16 — ABNORMAL HIGH (ref 5–15)
BUN: 19 mg/dL (ref 6–20)
CHLORIDE: 97 mmol/L — AB (ref 101–111)
CO2: 21 mmol/L — AB (ref 22–32)
CREATININE: 0.75 mg/dL (ref 0.44–1.00)
Calcium: 9.4 mg/dL (ref 8.9–10.3)
Glucose, Bld: 124 mg/dL — ABNORMAL HIGH (ref 65–99)
POTASSIUM: 3.9 mmol/L (ref 3.5–5.1)
SODIUM: 134 mmol/L — AB (ref 135–145)
Total Bilirubin: 0.9 mg/dL (ref 0.3–1.2)
Total Protein: 7.7 g/dL (ref 6.5–8.1)

## 2016-04-22 LAB — CBC
HCT: 29.8 % — ABNORMAL LOW (ref 36.0–46.0)
Hemoglobin: 9.5 g/dL — ABNORMAL LOW (ref 12.0–15.0)
MCH: 28.6 pg (ref 26.0–34.0)
MCHC: 31.9 g/dL (ref 30.0–36.0)
MCV: 89.8 fL (ref 78.0–100.0)
PLATELETS: 123 10*3/uL — AB (ref 150–400)
RBC: 3.32 MIL/uL — AB (ref 3.87–5.11)
RDW: 18.6 % — AB (ref 11.5–15.5)
WBC: 6.9 10*3/uL (ref 4.0–10.5)

## 2016-04-22 LAB — MRSA PCR SCREENING: MRSA BY PCR: NEGATIVE

## 2016-04-22 LAB — URINE CULTURE: CULTURE: NO GROWTH

## 2016-04-22 LAB — HEMOGLOBIN A1C
HEMOGLOBIN A1C: 5.8 % — AB (ref 4.8–5.6)
Mean Plasma Glucose: 120 mg/dL

## 2016-04-22 MED ORDER — DEXMEDETOMIDINE HCL IN NACL 200 MCG/50ML IV SOLN
0.2000 ug/kg/h | INTRAVENOUS | Status: AC
  Administered 2016-04-22: 0.5 ug/kg/h via INTRAVENOUS
  Administered 2016-04-23 (×2): 0.3 ug/kg/h via INTRAVENOUS
  Filled 2016-04-22 (×3): qty 50

## 2016-04-22 MED ORDER — LORAZEPAM 2 MG/ML IJ SOLN
2.0000 mg | Freq: Once | INTRAMUSCULAR | Status: AC
  Administered 2016-04-22: 2 mg via INTRAVENOUS
  Filled 2016-04-22: qty 1

## 2016-04-22 MED ORDER — DEXMEDETOMIDINE HCL IN NACL 200 MCG/50ML IV SOLN
0.4000 ug/kg/h | INTRAVENOUS | Status: DC
  Administered 2016-04-22: 0.6 ug/kg/h via INTRAVENOUS
  Administered 2016-04-22: 0.4 ug/kg/h via INTRAVENOUS
  Filled 2016-04-22: qty 50

## 2016-04-22 MED ORDER — LORAZEPAM 2 MG/ML IJ SOLN
2.0000 mg | Freq: Once | INTRAMUSCULAR | Status: AC | PRN
  Administered 2016-04-22: 2 mg via INTRAVENOUS
  Filled 2016-04-22: qty 1

## 2016-04-22 MED ORDER — LORAZEPAM 2 MG/ML IJ SOLN: 2.0000 mg | Freq: Once | INTRAMUSCULAR | Status: DC

## 2016-04-22 MED ORDER — LORAZEPAM 2 MG/ML IJ SOLN: 4.0000 mg | Freq: Once | INTRAMUSCULAR | Status: DC

## 2016-04-22 MED ORDER — METOCLOPRAMIDE HCL 5 MG/ML IJ SOLN
10.0000 mg | Freq: Once | INTRAMUSCULAR | Status: AC
  Administered 2016-04-22: 10 mg via INTRAVENOUS
  Filled 2016-04-22: qty 2

## 2016-04-22 MED ORDER — ENOXAPARIN SODIUM 40 MG/0.4ML ~~LOC~~ SOLN
40.0000 mg | SUBCUTANEOUS | Status: DC
  Administered 2016-04-22 – 2016-05-11 (×20): 40 mg via SUBCUTANEOUS
  Filled 2016-04-22 (×21): qty 0.4

## 2016-04-22 MED ORDER — ADULT MULTIVITAMIN W/MINERALS CH: 1.0000 | ORAL_TABLET | Freq: Every day | ORAL | Status: DC

## 2016-04-22 MED ORDER — ARFORMOTEROL TARTRATE 15 MCG/2ML IN NEBU
15.0000 ug | INHALATION_SOLUTION | Freq: Two times a day (BID) | RESPIRATORY_TRACT | Status: DC
  Administered 2016-04-22 – 2016-05-09 (×18): 15 ug via RESPIRATORY_TRACT
  Filled 2016-04-22 (×31): qty 2

## 2016-04-22 MED ORDER — DIPHENHYDRAMINE HCL 50 MG/ML IJ SOLN
25.0000 mg | Freq: Once | INTRAMUSCULAR | Status: AC
  Administered 2016-04-22: 25 mg via INTRAVENOUS
  Filled 2016-04-22: qty 1

## 2016-04-22 MED ORDER — FENTANYL CITRATE (PF) 100 MCG/2ML IJ SOLN
25.0000 ug | INTRAMUSCULAR | Status: DC | PRN
  Administered 2016-04-22 – 2016-04-23 (×4): 25 ug via INTRAVENOUS
  Filled 2016-04-22 (×4): qty 2

## 2016-04-22 MED ORDER — MIDAZOLAM HCL 2 MG/2ML IJ SOLN: 1.0000 mg | INTRAMUSCULAR | Status: DC | PRN

## 2016-04-22 MED ORDER — BUDESONIDE 0.5 MG/2ML IN SUSP
0.5000 mg | Freq: Two times a day (BID) | RESPIRATORY_TRACT | Status: DC
  Administered 2016-04-22 – 2016-05-09 (×18): 0.5 mg via RESPIRATORY_TRACT
  Filled 2016-04-22 (×27): qty 2

## 2016-04-22 NOTE — Progress Notes (Signed)
All night patient kept taking off telemetry even with wrist restraints and mittens. RN and NT would put back on every time patient got IV.  Ativan and would settle down for a little while. Unable to have telemetry on for very long. NP was aware.

## 2016-04-22 NOTE — Progress Notes (Signed)
Confused,agitated-pulling off telemetry/mittens Has got around 9 mg of Ativan since last midnight Transfer to SDU-spoke with PCCM-Dr Ramaswamy-start Precedex Full note to follow

## 2016-04-22 NOTE — Progress Notes (Signed)
PROGRESS NOTE        PATIENT DETAILS Name: Rhonda Mitchell Age: 55 y.o. Sex: female Date of Birth: 10-30-1961 Admit Date: 04/20/2016 Admitting Physician Clydia Llano, MD PCP:No primary care provider on file. Outpatient Specialists:None  Brief Narrative: Patient is a 55 y.o. female with long-standing history of alcohol abuse, admitted for alcohol withdrawal.  Subjective: Agitated, combative this morning. Had received 9 mg of Ativan today.  Assessment/Plan: Principal Problem: Delirium tremens: Has required increasing amounts of Ativan this morning, continues to be very agitated and combative. Transfer to stepdown, start Precedex infusion. PCCM consulted  Active Problems: Abdominal pain: Abdomen appears soft and nontender today. Suspect alcoholic gastritis, continue PPI and follow clinically.  Alcoholic hepatitis: LFTs improving with supportive care. Follow  Anemia: Normocytic-suspected related to underlying chronic disease and hep C. Follow.  History of chronic hepatitis C: Stable for outpatient follow-up.  DVT Prophylaxis: Prophylactic Lovenox   Code Status: Full code  Family Communication: None at bedside  Disposition Plan: Remain inpatient-not stable for discharge-may require SNF if deconditioned  Antimicrobial agents: None  Procedures: None  CONSULTS:  pulmonary/intensive care  Time spent: 30 minutes-Greater than 50% of this time was spent in counseling, explanation of diagnosis, planning of further management, and coordination of care.  MEDICATIONS: Anti-infectives    None      Scheduled Meds: . feeding supplement (ENSURE ENLIVE)  237 mL Oral BID BM  . folic acid  1 mg Oral Daily  . heparin  5,000 Units Subcutaneous Q8H  . LORazepam  0-4 mg Intravenous Q12H  . multivitamin with minerals  1 tablet Oral Daily  . nicotine  14 mg Transdermal Daily  . pantoprazole  40 mg Oral BID AC  . sodium chloride flush  3 mL Intravenous  Q12H  . thiamine  100 mg Oral Daily   Or  . thiamine  100 mg Intravenous Daily   Continuous Infusions:  PRN Meds:.acetaminophen **OR** acetaminophen, gi cocktail, ketorolac, LORazepam **OR** LORazepam, metoprolol, ondansetron **OR** ondansetron (ZOFRAN) IV   PHYSICAL EXAM: Vital signs: Filed Vitals:   04/21/16 1942 04/22/16 0233 04/22/16 0345 04/22/16 1125  BP: 140/87 144/86 140/80 139/91  Pulse: 94 89 82 100  Temp: 98.1 F (36.7 C)  98.7 F (37.1 C) 97.7 F (36.5 C)  TempSrc: Oral  Oral Oral  Resp: 18  18 18   Height:      Weight:      SpO2: 96%  93% 94%   Filed Weights   04/20/16 1815  Weight: 52.6 kg (115 lb 15.4 oz)   Body mass index is 22.65 kg/(m^2).   Gen Exam: Awake-But confused, attempting to pull out telemetry leads and restraints.   Neck: Supple, No JVD.   Chest: B/L Clear.   CVS: S1 S2 Regular, no murmurs.  Abdomen: soft, BS +, non tender, non distended.  Extremities: no edema, lower extremities warm to touch. Neurologic: Non Focal-seems to move all 4 extremities-difficult exam given mental status  Skin: No Rash or lesions   Wounds: N/A.    LABORATORY DATA: CBC:  Recent Labs Lab 04/20/16 1343 04/21/16 0805 04/22/16 0718  WBC 7.8 4.1 6.9  HGB 9.3* 8.7* 9.5*  HCT 29.8* 27.8* 29.8*  MCV 92.8 90.6 89.8  PLT 179 130* 123*    Basic Metabolic Panel:  Recent Labs Lab 04/20/16 1343 04/21/16 0805 04/22/16 0718  NA 137 137  134*  K 5.2* 3.6 3.9  CL 98* 101 97*  CO2 16* 24 21*  GLUCOSE 78 88 124*  BUN 18 9 19   CREATININE 0.85 0.58 0.75  CALCIUM 8.1* 7.8* 9.4    GFR: Estimated Creatinine Clearance: 57.7 mL/min (by C-G formula based on Cr of 0.75).  Liver Function Tests:  Recent Labs Lab 04/20/16 1343 04/21/16 0805 04/22/16 0718  AST 176* 92* 77*  ALT 51 40 38  ALKPHOS 68 60 70  BILITOT 1.2 1.5* 0.9  PROT 7.8 6.9 7.7  ALBUMIN 4.1 3.6 3.9    Recent Labs Lab 04/20/16 1343  LIPASE 42   No results for input(s): AMMONIA in the  last 168 hours.  Coagulation Profile:  Recent Labs Lab 04/20/16 2004  INR 1.01    Cardiac Enzymes: No results for input(s): CKTOTAL, CKMB, CKMBINDEX, TROPONINI in the last 168 hours.  BNP (last 3 results) No results for input(s): PROBNP in the last 8760 hours.  HbA1C:  Recent Labs  04/20/16 2004  HGBA1C 5.8*    CBG: No results for input(s): GLUCAP in the last 168 hours.  Lipid Profile: No results for input(s): CHOL, HDL, LDLCALC, TRIG, CHOLHDL, LDLDIRECT in the last 72 hours.  Thyroid Function Tests:  Recent Labs  04/20/16 2004  TSH 2.821    Anemia Panel: No results for input(s): VITAMINB12, FOLATE, FERRITIN, TIBC, IRON, RETICCTPCT in the last 72 hours.  Urine analysis:    Component Value Date/Time   COLORURINE YELLOW 04/20/2016 1741   APPEARANCEUR CLEAR 04/20/2016 1741   LABSPEC 1.019 04/20/2016 1741   PHURINE 6.0 04/20/2016 1741   GLUCOSEU NEGATIVE 04/20/2016 1741   HGBUR NEGATIVE 04/20/2016 1741   BILIRUBINUR NEGATIVE 04/20/2016 1741   KETONESUR >80* 04/20/2016 1741   PROTEINUR NEGATIVE 04/20/2016 1741   UROBILINOGEN 1.0 08/14/2015 1140   NITRITE NEGATIVE 04/20/2016 1741   LEUKOCYTESUR NEGATIVE 04/20/2016 1741    Sepsis Labs: Lactic Acid, Venous No results found for: LATICACIDVEN  MICROBIOLOGY: Recent Results (from the past 240 hour(s))  Urine culture     Status: None   Collection Time: 04/20/16  5:41 PM  Result Value Ref Range Status   Specimen Description URINE, RANDOM  Final   Special Requests NONE  Final   Culture   Final    NO GROWTH 1 DAY [facility]formed [facility]    Report Status 04/22/2016 FINAL  Final  C difficile quick scan w PCR reflex     Status: Abnormal   Collection Time: 04/21/16  8:00 PM  Result Value Ref Range Status   C Diff antigen POSITIVE (A) NEGATIVE Final   C Diff toxin NEGATIVE NEGATIVE Final   C Diff interpretation   Final    C. difficile present, but toxin not detected. This indicates colonization. In  most cases, this does not require treatment. If patient has signs and symptoms consistent with colitis, consider treatment. Requires ENTERIC precautions.  MRSA PCR Screening     Status: None   Collection Time: 04/22/16 10:32 AM  Result Value Ref Range Status   MRSA by PCR NEGATIVE NEGATIVE Final    Comment:        The GeneXpert MRSA Assay (FDA approved for NASAL specimens only), is one component of a comprehensive MRSA colonization surveillance program. It is not intended to diagnose MRSA infection nor to guide or monitor treatment for MRSA infections.     RADIOLOGY STUDIES/RESULTS: Dg Chest 2 View  04/20/2016  CLINICAL DATA:  Chest pain and shortness of breath,  chronic EXAM: CHEST  2 VIEW COMPARISON:  February 19, 2016 FINDINGS: There is mild scarring in the lingula. Lungs elsewhere are clear. The heart size and pulmonary vascularity are normal. No adenopathy. No pneumothorax. No bone lesions. IMPRESSION: No edema or consolidation.  Slight scarring in the lingula. Electronically Signed   By: Bretta Bang III M.D.   On: 04/20/2016 13:37     LOS: 1 day   Jeoffrey Massed, MD  [facility] Pager:[phone]  If 7PM-7AM, please contact night-coverage [email or web address] Password [email or web address] 04/22/2016, 1:39 PM

## 2016-04-22 NOTE — Progress Notes (Signed)
RN notified on call about patient's CIWA still being high. NP came up to see and talk to the patient. Patient was still complaining of a headache. NP gave orders for Benadryl and Reglan for the headache and a one time dose of Ativan IV. NP told RN to call back if needed.

## 2016-04-22 NOTE — Progress Notes (Signed)
Despite patient being on .9 on precedex and in bilateral wrist and a waist belt restraints, patient still able to pull out indwelling urinary catheter. At time of removal balloon was inflated but no trauma was noted by RN. Patient vitals remain stable and patient remains confused

## 2016-04-22 NOTE — Consult Note (Signed)
Name: Rhonda Mitchell MRN: [id number] DOB: Apr 11, 1961    ADMISSION DATE:  04/20/2016 CONSULTATION DATE: 5/3  REFERRING MD :  Jerral RalphGhimire  CHIEF COMPLAINT:  ETOH w/d  BRIEF PATIENT DESCRIPTION:   55 year old female admitted 5/1 w/ ETOH w/d. Transferred to SDU 5/3 for consideration for precedex gtt given escalated doses of ativan.   SIGNIFICANT EVENTS    STUDIES:     HISTORY OF PRESENT ILLNESS:   55 y.o. female with medical history significant of alcohol abuse, COPD and hep C who presented to the ED because of epigastric abdominal pain, nausea and shakes. Patient is noncompliant. Showed up to the emergency department 8 times in the past 6 months of 4 time she was admitted. She was recently in the hospital from March 1 till March 20 for alcohol detox. which required drug-induced coma. Reported 2 days after discharge from the hospital she started to drink alcohol. Reports she drinks 2 pints per day, she just decided 4/28 she was sick and tired of drinking and she wanted to quit so she drank about 3 in the morning and came into the ED seeking detoxification. She was admitted to the in-pt setting. Started on the CIWA protocol. Required escalating benzo dosing so was transferred to the SDU on 5/3 for precedex gtt.    PAST MEDICAL HISTORY :   has a past medical history of COPD (chronic obstructive pulmonary disease) (HCC); Anxiety; Shortness of breath; Arthritis; GERD (gastroesophageal reflux disease); Full dentures; Insomnia; Alcohol abuse; Hepatitis C; Alcohol abuse; History of MRSA infection; and History of encephalopathy.  has past surgical history that includes arm surgery; Hand surgery (1990); Shoulder surgery; ORIF ulnar / radial shaft fracture (1990); ORIF ulnar fracture (Left, 05/23/2014); and I&D extremity (Right, 05/07/2015). Prior to Admission medications   Medication Sig Start Date End Date Taking? Authorizing Provider  albuterol (PROVENTIL HFA;VENTOLIN HFA) 108 (90 Base) MCG/ACT inhaler  Inhale 2 puffs into the lungs every 4 (four) hours as needed for wheezing or shortness of breath. 03/05/16  Yes Shanker Levora DredgeM Ghimire, MD  benztropine (COGENTIN) 0.5 MG tablet Take 1 tablet (0.5 mg total) by mouth 2 (two) times daily. 03/05/16  Yes Shanker Levora DredgeM Ghimire, MD  budesonide-formoterol (SYMBICORT) 160-4.5 MCG/ACT inhaler Inhale 2 puffs into the lungs 2 (two) times daily. Reported on 02/20/2016 03/05/16  Yes Shanker Levora DredgeM Ghimire, MD  clonazePAM (KLONOPIN) 0.5 MG tablet Take 2 tablets (1 mg total) by mouth at bedtime. 03/09/16  Yes Leroy SeaPrashant K Singh, MD  DULoxetine (CYMBALTA) 30 MG capsule Take 1 capsule (30 mg total) by mouth daily. 03/05/16  Yes Shanker Levora DredgeM Ghimire, MD  gabapentin (NEURONTIN) 300 MG capsule Take 1 capsule (300 mg total) by mouth 3 (three) times daily. 03/06/16  Yes Shanker Levora DredgeM Ghimire, MD  haloperidol (HALDOL) 2 MG tablet Take 1 tablet (2 mg total) by mouth 3 (three) times daily. 03/06/16  Yes Shanker Levora DredgeM Ghimire, MD  nicotine (NICODERM CQ - DOSED IN MG/24 HOURS) 21 mg/24hr patch Place 1 patch (21 mg total) onto the skin daily. 03/05/16  Yes Shanker Levora DredgeM Ghimire, MD  oxyCODONE (OXY IR/ROXICODONE) 5 MG immediate release tablet Take 1 tablet (5 mg total) by mouth every 6 (six) hours as needed for moderate pain. 03/05/16  Yes Shanker Levora DredgeM Ghimire, MD  thiamine 100 MG tablet Take 1 tablet (100 mg total) by mouth daily. 03/05/16  Yes Shanker Levora DredgeM Ghimire, MD  tiotropium (SPIRIVA) 18 MCG inhalation capsule Place 18 mcg into inhaler and inhale daily.   Yes Historical  Provider, MD  traZODone (DESYREL) 50 MG tablet Take 1 tablet (50 mg total) by mouth at bedtime. 03/05/16  Yes Shanker Levora Dredge, MD   No Known Allergies  FAMILY HISTORY:  family history includes Breast cancer in her mother. SOCIAL HISTORY:  reports that she has been smoking Cigarettes.  She has a 20 pack-year smoking history. She has never used smokeless tobacco. She reports that she drinks alcohol. She reports that she does not use illicit  drugs.  REVIEW OF SYSTEMS:   Unable; confused .  SUBJECTIVE:  Sp slurred agitated at times  VITAL SIGNS: Temp:  [97.7 F (36.5 C)-98.7 F (37.1 C)] 97.7 F (36.5 C) (05/03 1125) Pulse Rate:  [82-100] 100 (05/03 1125) Resp:  [18] 18 (05/03 1125) BP: (139-144)/(80-91) 139/91 mmHg (05/03 1125) SpO2:  [93 %-96 %] 94 % (05/03 1125)  PHYSICAL EXAMINATION: General:  Chronically ill appearing white female, agitated at times Neuro:  Awake, confused. No focal def  HEENT:  Edentulous, no JVD   Cardiovascular:  RRR, no MRG Lungs:  Scattered rhonchi  Abdomen:  Soft, not tender  Musculoskeletal:  Intact  Skin:  Scattered area of ecchymosis    Recent Labs Lab 04/20/16 1343 04/21/16 0805 04/22/16 0718  NA 137 137 134*  K 5.2* 3.6 3.9  CL 98* 101 97*  CO2 16* 24 21*  BUN CREATININE 0.85 0.58 0.75  GLUCOSE 78 88 124*    Recent Labs Lab 04/20/16 1343 04/21/16 0805 04/22/16 0718  HGB 9.3* 8.7* 9.5*  HCT 29.8* 27.8* 29.8*  WBC 7.8 4.1 6.9  PLT 179 130* 123*   No results found.  ASSESSMENT / PLAN:  Acute encephalopathy in setting of Delirium tremens and ETOH w/d  Now up to 20 mg ativan since admit. Still agitated.  Plan Start precedex Cont low dose lorazepam PRN Cont folic acid and thiamine SDU monitoring   H/o COPD Plan Nebulized brovana and budesonide  Anemia of chronic disease; mild thrombocytopenia  Plan Trend cbc [place] heparin  [name] ACNP-BC [facility] Pulmonary/Critical Care Pager # [phone] OR # [phone] if no answer   04/22/2016, 2:25 PM  STAFF NOTE: I, Rory Percy, MD FACP have personally reviewed patient's available data, including medical history, events of note, physical examination and test results as part of my evaluation. I have discussed with resident/NP and other care providers such as pharmacist, RN and RRT. In addition, I personally evaluated patient and elicited key findings of: currently finally more calm, perrl, BP and  HR tolerating well precedex at 0.7, would NOT use ciwa, ativan per protocol, dc current precedex order and add precedex orderset, add prn versed, add prn fent, frequent neurochecks, cam max precedex to 1.4 for now, allow pos balance, dc ciwa assessments per RN, pulse ox maintain, BB okay, if BO rises add clonidine, follow lFT, NPO The patient is critically ill with multiple organ systems failure and requires high complexity decision making for assessment and support, frequent evaluation and titration of therapies, application of advanced monitoring technologies and extensive interpretation of multiple databases.   Critical Care Time devoted to patient care services described in this note is 30 Minutes. This time reflects time of care of this signee: Rory Percy, MD FACP. This critical care time does not reflect procedure time, or teaching time or supervisory time of PA/NP/Med student/Med Resident etc but could involve care discussion time. Rest per NP/medical resident whose note is outlined above and that I agree with   Reuel Boom  J. Tyson Alias, MD, FACP Pgr: [phone] [place] [facility] 04/22/2016 5:00 PM

## 2016-04-23 DIAGNOSIS — G9340 Encephalopathy, unspecified: Secondary | ICD-10-CM

## 2016-04-23 LAB — CBC
HEMATOCRIT: 31.9 % — AB (ref 36.0–46.0)
HEMOGLOBIN: 9.9 g/dL — AB (ref 12.0–15.0)
MCH: 28.7 pg (ref 26.0–34.0)
MCHC: 31 g/dL (ref 30.0–36.0)
MCV: 92.5 fL (ref 78.0–100.0)
Platelets: 125 10*3/uL — ABNORMAL LOW (ref 150–400)
RBC: 3.45 MIL/uL — AB (ref 3.87–5.11)
RDW: 19 % — ABNORMAL HIGH (ref 11.5–15.5)
WBC: 6.3 10*3/uL (ref 4.0–10.5)

## 2016-04-23 LAB — COMPREHENSIVE METABOLIC PANEL
ALK PHOS: 59 U/L (ref 38–126)
ALT: 35 U/L (ref 14–54)
ANION GAP: 13 (ref 5–15)
AST: 75 U/L — ABNORMAL HIGH (ref 15–41)
Albumin: 3.7 g/dL (ref 3.5–5.0)
BILIRUBIN TOTAL: 1 mg/dL (ref 0.3–1.2)
BUN: 17 mg/dL (ref 6–20)
CALCIUM: 9 mg/dL (ref 8.9–10.3)
CO2: 24 mmol/L (ref 22–32)
Chloride: 101 mmol/L (ref 101–111)
Creatinine, Ser: 0.58 mg/dL (ref 0.44–1.00)
GFR calc non Af Amer: >60 mL/min (ref >60)
Glucose, Bld: 83 mg/dL (ref 65–99)
Potassium: 3.7 mmol/L (ref 3.5–5.1)
Sodium: 138 mmol/L (ref 135–145)
TOTAL PROTEIN: 7.1 g/dL (ref 6.5–8.1)

## 2016-04-23 LAB — PHOSPHORUS: PHOSPHORUS: 3.7 mg/dL (ref 2.5–4.6)

## 2016-04-23 LAB — LIPASE, BLOOD: LIPASE: 19 U/L (ref 11–51)

## 2016-04-23 LAB — MAGNESIUM: MAGNESIUM: 1.6 mg/dL — AB (ref 1.7–2.4)

## 2016-04-23 MED ORDER — ACETAMINOPHEN 325 MG PO TABS
650.0000 mg | ORAL_TABLET | Freq: Four times a day (QID) | ORAL | Status: DC | PRN
  Administered 2016-04-23 – 2016-05-12 (×19): 650 mg via ORAL
  Filled 2016-04-23 (×19): qty 2

## 2016-04-23 MED ORDER — CLONIDINE HCL 0.1 MG PO TABS
0.1000 mg | ORAL_TABLET | Freq: Three times a day (TID) | ORAL | Status: DC
  Administered 2016-04-23 – 2016-04-26 (×10): 0.1 mg via ORAL
  Filled 2016-04-23 (×13): qty 1

## 2016-04-23 MED ORDER — MAGNESIUM SULFATE 2 GM/50ML IV SOLN
2.0000 g | Freq: Once | INTRAVENOUS | Status: AC
  Administered 2016-04-23: 2 g via INTRAVENOUS
  Filled 2016-04-23: qty 50

## 2016-04-23 MED ORDER — LORAZEPAM 2 MG/ML IJ SOLN
1.0000 mg | INTRAMUSCULAR | Status: DC | PRN
  Administered 2016-04-23 – 2016-05-01 (×9): 1 mg via INTRAVENOUS
  Filled 2016-04-23 (×10): qty 1

## 2016-04-23 MED ORDER — HALOPERIDOL 2 MG PO TABS
2.0000 mg | ORAL_TABLET | Freq: Three times a day (TID) | ORAL | Status: DC
  Administered 2016-04-23 – 2016-05-09 (×43): 2 mg via ORAL
  Filled 2016-04-23 (×54): qty 1

## 2016-04-23 MED ORDER — DULOXETINE HCL 30 MG PO CPEP
30.0000 mg | ORAL_CAPSULE | Freq: Every day | ORAL | Status: DC
  Administered 2016-04-23 – 2016-05-09 (×15): 30 mg via ORAL
  Filled 2016-04-23 (×16): qty 1

## 2016-04-23 MED ORDER — GABAPENTIN 300 MG PO CAPS
300.0000 mg | ORAL_CAPSULE | Freq: Three times a day (TID) | ORAL | Status: DC
  Administered 2016-04-23 – 2016-05-09 (×46): 300 mg via ORAL
  Filled 2016-04-23 (×50): qty 1

## 2016-04-23 MED ORDER — BENZTROPINE MESYLATE 0.5 MG PO TABS
0.5000 mg | ORAL_TABLET | Freq: Two times a day (BID) | ORAL | Status: DC
  Administered 2016-04-23 – 2016-05-09 (×30): 0.5 mg via ORAL
  Filled 2016-04-23 (×32): qty 1

## 2016-04-23 MED ORDER — OXYCODONE HCL 5 MG PO TABS
5.0000 mg | ORAL_TABLET | Freq: Four times a day (QID) | ORAL | Status: DC | PRN
  Administered 2016-04-23 – 2016-05-07 (×21): 5 mg via ORAL
  Filled 2016-04-23 (×22): qty 1

## 2016-04-23 NOTE — Progress Notes (Signed)
Patient continues to get out of bed & does not follow safety commands. Patient is very unsteady when she is standing and has a high risk for falls. She has pulled out one of her IV's. MD was called and posey belt was ordered and applied to patient.

## 2016-04-23 NOTE — Progress Notes (Addendum)
   Name: Rhonda GillesMichelle Mitchell MRN: [id number] DOB: Mar 19, 1961    ADMISSION DATE:  04/20/2016 CONSULTATION DATE: 5/3  REFERRING MD :  Jerral RalphGhimire  CHIEF COMPLAINT:  ETOH w/d  BRIEF PATIENT DESCRIPTION:   55 year old female admitted 5/1 w/ ETOH w/d. Transferred to SDU 5/3 for consideration for precedex gtt given escalated doses of ativan.   SIGNIFICANT EVENTS    STUDIES:    SUBJECTIVE:  Sedated  VITAL SIGNS: Temp:  [97.6 F (36.4 C)-98.2 F (36.8 C)] 97.6 F (36.4 C) (05/04 0808) Pulse Rate:  [56-100] 74 (05/04 0915) Resp:  [14-25] 21 (05/04 0915) BP: (77-139)/(44-91) 116/74 mmHg (05/04 0915) SpO2:  [85 %-100 %] 97 % (05/04 0915) 2 liter  PHYSICAL EXAMINATION: General:  Chronically ill appearing white female, agitated at times Neuro:  sedated, confused. No focal def  HEENT:  Edentulous, no JVD   Cardiovascular:  RRR, no MRG Lungs:  Scattered rhonchi improved  Abdomen:  Soft, not tender  Musculoskeletal:  Intact  Skin:  Scattered area of ecchymosis    Recent Labs Lab 04/21/16 0805 04/22/16 0718 04/23/16 0315  NA 137 134* 138  K 3.6 3.9 3.7  CL 101 97* 101  CO2 24 21* 24  BUN 9 19 17   CREATININE 0.58 0.75 0.58  GLUCOSE 88 124* 83    Recent Labs Lab 04/21/16 0805 04/22/16 0718 04/23/16 0315  HGB 8.7* 9.5* 9.9*  HCT 27.8* 29.8* 31.9*  WBC 4.1 6.9 6.3  PLT 130* 123* 125*   No results found.  ASSESSMENT / PLAN:  Acute encephalopathy in setting of Delirium tremens and ETOH w/d  somnolent on am rounds 5/4 Plan Wean precedex to off Added back her home anti-psychotic and depression rx  Cont low dose lorazepam PRN Cont folic acid and thiamine SDU monitoring   H/o COPD Plan Nebulized brovana and budesonide  Anemia of chronic disease; mild thrombocytopenia  Plan Trend cbc [place] heparin  Simonne MartinetPeter E Amayra Kiedrowski ACNP-BC [facility] Pulmonary/Critical Care Pager # [phone] OR # [phone] if no answer   04/23/2016, 10:34 AM

## 2016-04-24 MED ORDER — SODIUM CHLORIDE 0.9 % IV BOLUS (SEPSIS)
500.0000 mL | Freq: Once | INTRAVENOUS | Status: AC
  Administered 2016-04-24: 500 mL via INTRAVENOUS

## 2016-04-24 MED ORDER — HALOPERIDOL LACTATE 5 MG/ML IJ SOLN
5.0000 mg | Freq: Once | INTRAMUSCULAR | Status: AC
  Administered 2016-04-24 – 2016-04-25 (×2): 5 mg via INTRAVENOUS
  Filled 2016-04-24: qty 1

## 2016-04-24 NOTE — Progress Notes (Signed)
Date:  Apr 24, 2016 Chart reviewed for concurrent status and case management needs. Will continue to follow patient for changes and needs: remains hypotensive and requiring iv boluses Marcelle Smilinghonda Natsumi Whitsitt, BSN, [place], [place]   [phone]

## 2016-04-24 NOTE — Progress Notes (Signed)
   Name: Rhonda GillesMichelle Huante MRN: [id number] DOB: 04/26/1961    ADMISSION DATE:  04/20/2016 CONSULTATION DATE: 5/3  REFERRING MD :  Jerral RalphGhimire  CHIEF COMPLAINT:  ETOH w/d  BRIEF PATIENT DESCRIPTION:   55 year old female admitted 5/1 w/ ETOH w/d. Transferred to SDU 5/3 for consideration for precedex gtt given escalated doses of ativan.   SIGNIFICANT EVENTS    STUDIES:    SUBJECTIVE:  Sedated  VITAL SIGNS: Temp:  [97.9 F (36.6 C)-98.5 F (36.9 C)] 98.3 F (36.8 C) (05/05 1115) Pulse Rate:  [62-91] 91 (05/05 1100) Resp:  [15-29] 16 (05/05 1100) BP: (81-128)/(45-92) 107/71 mmHg (05/05 1100) SpO2:  [86 %-100 %] 89 % (05/05 1100) 2 liter  PHYSICAL EXAMINATION: General:  Chronically ill appearing white female, still confused but no longer agitated  Neuro:  awake, confused. No focal def, generalized weakness HEENT:  Edentulous, no JVD   Cardiovascular:  RRR, no MRG Lungs:  Scattered rhonchi improved  Abdomen:  Soft, not tender  Musculoskeletal:  Intact  Skin:  Scattered area of ecchymosis    Recent Labs Lab 04/21/16 0805 04/22/16 0718 04/23/16 0315  NA 137 134* 138  K 3.6 3.9 3.7  CL 101 97* 101  CO2 24 21* 24  BUN 9 19 17   CREATININE 0.58 0.75 0.58  GLUCOSE 88 124* 83    Recent Labs Lab 04/21/16 0805 04/22/16 0718 04/23/16 0315  HGB 8.7* 9.5* 9.9*  HCT 27.8* 29.8* 31.9*  WBC 4.1 6.9 6.3  PLT 130* 123* 125*   No results found.  ASSESSMENT / PLAN:  Acute encephalopathy in setting of Delirium tremens and ETOH w/d  More awake; off precedex  Plan Added back her home anti-psychotic and depression rx  Cont low dose lorazepam PRN Cont folic acid and thiamine SDU monitoring  May need safety sitter  Deconditioning  Plan PT consult   H/o COPD Plan Nebulized brovana and budesonide  Anemia of chronic disease; mild thrombocytopenia  Plan Trend cbc [place] heparin  Simonne MartinetPeter E Rhaelyn Giron ACNP-BC [facility] Pulmonary/Critical Care Pager # [phone] OR # [phone] if  no answer   04/24/2016, 11:23 AM

## 2016-04-24 NOTE — Progress Notes (Signed)
eLink Physician-Brief Progress Note Patient Name: Rhonda GillesMichelle Mitchell DOB: 16-Jan-1961 MRN: [id number]   Date of Service  04/24/2016  HPI/Events of Note  Agitation.  Precedex d/ced earlier today due to sedation.  Now on prn and scheduled meds.  Nurse requesting additional meds or restraints.  eICU Interventions  Plan: Will try additional haldol IV 5 mg  (QTc less than 500)     Intervention Category Major Interventions: Delirium, psychosis, severe agitation - evaluation and management  Maxim Bedel 04/24/2016, 11:33 PM

## 2016-04-24 NOTE — Progress Notes (Signed)
eLink Physician-Brief Progress Note Patient Name: Rhonda Mitchell DOB: November 05, 1961 MRN: [id number]   Date of Service  04/24/2016  HPI/Events of Note  Hypotension with BP of 83/48 (56) and hypoxia with O2 sats of 85% on RA.  eICU Interventions  Plan: 500 cc bolus of NS for BP support Oxygen therapy to keep sats 90% or greater     Intervention Category Intermediate Interventions: Hypotension - evaluation and management;Other:  DETERDING,ELIZABETH 04/24/2016, 1:21 AM

## 2016-04-24 NOTE — Progress Notes (Signed)
Nutrition Follow-up  DOCUMENTATION CODES:   Not applicable  INTERVENTION:  - Continue Ensure Enlive BID; will monitor for need for adjustment based on medical course and associated needs - RD will continue to monitor medical course and POC  NUTRITION DIAGNOSIS:   Inadequate oral intake related to acute illness as evidenced by meal completion < 25%. -ongoing  GOAL:   Patient will meet greater than or equal to 90% of their needs -unmet  MONITOR:   PO intake, Weight trends, Labs, I & O's  ASSESSMENT:   55 y.o. female with medical history significant of alcohol abuse, COPD and hep C who presented to the ED because of epigastric abdominal pain, nausea and shakes. Patient is noncompliant with medical advises, showed up to the emergency department 8 times in the past 6 months of 4 time she was admitted. She was recently in the hospital from March 1 till March 20 for alcohol detoxification which required drug-induced coma. Reported 2 days after discharge from the hospital she started to drink alcohol. Reports she drinks 2 pints per day, she just decided yesterday she is sick and tired of drinking and she wants to quit so she drank about 3 in the morning and came into the ED seeking detoxification.  5/5 No intakes documented since previous assessment. Pt noted to be agitated and confused since previous assessment and required restraints on 5/3. Precedex was started 5/3 and weaning began on 5/4 with drip now off. Spoke with RN who reports pt did not consume breakfast this AM related to current state. Unable to complete physical assessment this visit related to current state; will monitor for ability to complete this at next follow-up. No new weight since admission.  Pt not meeting needs and likely meets criteria for some degree of malnutrition but unable to confirm at this time. Medications reviewed. Labs reviewed; Mg: 1.6 mg/dL.    5/2 - No intakes documented since admission and RN reports  pt ate a small amount of breakfast but that she is unable to quantify amount that pt ate.  - Pt is currently sitting up in bed eating a hamburger for lunch.  - Pt unable to provide significant information from PTA so will obtain all information at follow-up. - Pt admitted for alcohol detox and notes indicate pt with withdrawal symptoms (shaking, tachycardia) on admission. - Unable to complete physical assessment at this time and will do so at follow-up.  - Per chart review, pt gained 10 lbs from 11/14/15-12/18/15 and subsequently lost 5 lbs (4% body weight) from 12/18/15-04/20/16 which is not significant for time frame.    Diet Order:  Diet regular Room service appropriate?: Yes; Fluid consistency:: Thin  Skin:  Reviewed, no issues  Last BM:  PTA  Height:   Ht Readings from Last 1 Encounters:  04/20/16 5' (1.524 m)    Weight:   Wt Readings from Last 1 Encounters:  04/20/16 115 lb 15.4 oz (52.6 kg)    Ideal Body Weight:  45.45 kg (kg)  BMI:  Body mass index is 22.65 kg/(m^2).  Estimated Nutritional Needs:   Kcal:  1315-1580 (25-30 kcal/kg)  Protein:  55-65 grams  Fluid:  >/= 1.5 L/day  EDUCATION NEEDS:   No education needs identified at this time     Trenton GammonJessica Marl Seago, RD, LDN Inpatient Clinical Dietitian Pager # [phone] After hours/weekend pager # [phone]

## 2016-04-24 NOTE — Progress Notes (Signed)
Patient became agitated and stated that she wanted to refuse her morning labs.

## 2016-04-25 DIAGNOSIS — J449 Chronic obstructive pulmonary disease, unspecified: Secondary | ICD-10-CM

## 2016-04-25 DIAGNOSIS — F329 Major depressive disorder, single episode, unspecified: Secondary | ICD-10-CM

## 2016-04-25 LAB — CBC WITH DIFFERENTIAL/PLATELET
BASOS ABS: 0 10*3/uL (ref 0.0–0.1)
Basophils Relative: 0 %
EOS ABS: 0.3 10*3/uL (ref 0.0–0.7)
EOS PCT: 4 %
HCT: 29.9 % — ABNORMAL LOW (ref 36.0–46.0)
HEMOGLOBIN: 9.2 g/dL — AB (ref 12.0–15.0)
LYMPHS ABS: 1.3 10*3/uL (ref 0.7–4.0)
LYMPHS PCT: 19 %
MCH: 29.2 pg (ref 26.0–34.0)
MCHC: 30.8 g/dL (ref 30.0–36.0)
MCV: 94.9 fL (ref 78.0–100.0)
Monocytes Absolute: 0.9 10*3/uL (ref 0.1–1.0)
Monocytes Relative: 12 %
NEUTROS PCT: 65 %
Neutro Abs: 4.6 10*3/uL (ref 1.7–7.7)
PLATELETS: 176 10*3/uL (ref 150–400)
RBC: 3.15 MIL/uL — AB (ref 3.87–5.11)
RDW: 20.5 % — ABNORMAL HIGH (ref 11.5–15.5)
WBC: 7.1 10*3/uL (ref 4.0–10.5)

## 2016-04-25 LAB — PHOSPHORUS: PHOSPHORUS: 4.8 mg/dL — AB (ref 2.5–4.6)

## 2016-04-25 LAB — BASIC METABOLIC PANEL
Anion gap: 8 (ref 5–15)
BUN: 12 mg/dL (ref 6–20)
CHLORIDE: 103 mmol/L (ref 101–111)
CO2: 27 mmol/L (ref 22–32)
CREATININE: 0.61 mg/dL (ref 0.44–1.00)
Calcium: 8.4 mg/dL — ABNORMAL LOW (ref 8.9–10.3)
GFR calc non Af Amer: >60 mL/min (ref >60)
Glucose, Bld: 116 mg/dL — ABNORMAL HIGH (ref 65–99)
POTASSIUM: 4 mmol/L (ref 3.5–5.1)
SODIUM: 138 mmol/L (ref 135–145)

## 2016-04-25 LAB — MAGNESIUM: Magnesium: 1.9 mg/dL (ref 1.7–2.4)

## 2016-04-25 MED ORDER — FENTANYL CITRATE (PF) 100 MCG/2ML IJ SOLN
12.5000 ug | INTRAMUSCULAR | Status: DC | PRN
  Administered 2016-04-25: 15 ug via INTRAVENOUS
  Filled 2016-04-25: qty 2

## 2016-04-25 MED ORDER — LORAZEPAM 2 MG/ML IJ SOLN
2.0000 mg | INTRAMUSCULAR | Status: DC | PRN
  Administered 2016-04-25 – 2016-04-26 (×7): 2 mg via INTRAVENOUS
  Administered 2016-04-26: 3 mg via INTRAVENOUS
  Administered 2016-04-26 – 2016-05-01 (×11): 2 mg via INTRAVENOUS
  Administered 2016-05-01: 3 mg via INTRAVENOUS
  Administered 2016-05-01 – 2016-05-02 (×8): 2 mg via INTRAVENOUS
  Filled 2016-04-25 (×3): qty 1
  Filled 2016-04-25 (×2): qty 2
  Filled 2016-04-25 (×16): qty 1
  Filled 2016-04-25: qty 2
  Filled 2016-04-25 (×7): qty 1

## 2016-04-25 MED ORDER — FENTANYL CITRATE (PF) 100 MCG/2ML IJ SOLN
12.5000 ug | INTRAMUSCULAR | Status: DC | PRN
  Administered 2016-04-26: 25 ug via INTRAVENOUS
  Administered 2016-04-28 (×2): 12.5 ug via INTRAVENOUS
  Filled 2016-04-25 (×3): qty 2

## 2016-04-25 MED ORDER — HALOPERIDOL LACTATE 5 MG/ML IJ SOLN
INTRAMUSCULAR | Status: AC
  Administered 2016-04-25: 5 mg via INTRAVENOUS
  Filled 2016-04-25: qty 1

## 2016-04-25 MED ORDER — HALOPERIDOL LACTATE 5 MG/ML IJ SOLN
5.0000 mg | Freq: Once | INTRAMUSCULAR | Status: AC
  Administered 2016-04-25: 5 mg via INTRAVENOUS

## 2016-04-25 NOTE — Progress Notes (Signed)
eLink Physician-Brief Progress Note Patient Name: Rhonda GillesMichelle Mitchell DOB: [birth year] MRN: [id number]   Date of Service  04/25/2016  HPI/Events of Note  Patient c/o of generalized pain not relieved by oxy oral.  Also concern for safety due to agitation.  eICU Interventions  Plan: Fentanyl 12.5 to 25 mcg IV q2 hours prn pain Bilateral soft wrist restraints for patient safety.     Intervention Category Intermediate Interventions: Other:;Pain - evaluation and management  DETERDING,ELIZABETH 04/25/2016, 12:41 AM

## 2016-04-25 NOTE — Evaluation (Signed)
Physical Therapy Evaluation Patient Details Name: Rhonda Mitchell MRN: [id number] DOB: [birth year] Today's Date: 04/25/2016   History of Present Illness  Rhonda Mitchell is a 55 y.o. female with a history of ETOH Abuse, COPD, Hep C admitted with burning Epigastric ABD Pain and SOB, and Nausea Vomiting and Diarrhea.She drinks 3-4 pints of liquor daily and has done so for many years . Her Alcohol level was found at 298. She has a UTI and placed on ETOH withdrawal protocol  Clinical Impression  Pt admitted as above and presenting with functional mobility limitations 2* generalized weakness, questionable cognition and safety awareness and balance deficits.  Pt would benefit from follow up rehab at SNF level to maximize IND and safety prior to return home with ltd assist.    Follow Up Recommendations SNF;Supervision/Assistance - 24 hour    Equipment Recommendations  None recommended by PT    Recommendations for Other Services OT consult     Precautions / Restrictions Precautions Precautions: Fall Restrictions Weight Bearing Restrictions: No      Mobility  Bed Mobility Overal bed mobility: Modified Independent             General bed mobility comments: Pt found at EOB at bottom of lower rail and needing use of bathroom  Transfers Overall transfer level: Needs assistance Equipment used: None Transfers: Sit to/from Stand Sit to Stand: Min assist;Mod assist Stand pivot transfers: Min assist;Mod assist       General transfer comment: cues for safe transition position and use of UEs to self assist.  Stand [facility] to [facility]  Ambulation/Gait Ambulation/Gait assistance: Min assist;+2 safety/equipment Ambulation Distance (Feet): 150 Feet Assistive device: Rolling walker (2 wheeled) Gait Pattern/deviations: Step-through pattern;Decreased step length - right;Decreased step length - left;Shuffle;Trunk flexed;Decreased stance time - right;Wide base of support     General Gait  Details: Cues for posture, position from RW, pacing 2* SOB and saftey awareness.  Stairs            Wheelchair Mobility    Modified Rankin (Stroke Patients Only)       Balance Overall balance assessment: Needs assistance Sitting-balance support: Feet supported;No upper extremity supported Sitting balance-Leahy Scale: Fair     Standing balance support: No upper extremity supported Standing balance-Leahy Scale: Poor                               Pertinent Vitals/Pain Pain Assessment: Faces Faces Pain Scale: Hurts little more Pain Location: R knee with ambulation - pt states she needs knee replacement    Home Living Family/patient expects to be discharged to:: Private residence Living Arrangements: Non-relatives/Friends Available Help at Discharge: Available PRN/intermittently Type of Home: House Home Access: Ramped entrance     Home Layout: One level Home Equipment: Environmental consultant - 2 wheels;Cane - single point Additional Comments: Pt states she is home alone "a lot"    Prior Function Level of Independence: Independent         Comments: pt drank 3-4 pints of vodka daily and smokes 2 packs a cigarettes a day     Hand Dominance   Dominant Hand: Right    Extremity/Trunk Assessment   Upper Extremity Assessment: Generalized weakness           Lower Extremity Assessment: Generalized weakness      Cervical / Trunk Assessment: Kyphotic  Communication   Communication: No difficulties  Cognition Arousal/Alertness: Awake/alert Behavior During Therapy: Impulsive Overall Cognitive Status:  No family/caregiver present to determine baseline cognitive functioning                      General Comments      Exercises        Assessment/Plan    PT Assessment Patient needs continued PT services  PT Diagnosis Difficulty walking   PT Problem List Decreased strength;Decreased activity tolerance;Decreased balance;Decreased mobility;Decreased  cognition;Decreased knowledge of use of DME;Decreased safety awareness;Pain  PT Treatment Interventions DME instruction;Gait training;Functional mobility training;Therapeutic activities;Therapeutic exercise;Patient/family education   PT Goals (Current goals can be found in the Care Plan section) Acute Rehab PT Goals Patient Stated Goal: HOME PT Goal Formulation: With patient Time For Goal Achievement: 05/09/16 Potential to Achieve Goals: Fair    Frequency Min 3X/week   Barriers to discharge Decreased caregiver support Pt states home alone "a lot"    Co-evaluation               End of Session Equipment Utilized During Treatment: Gait belt Activity Tolerance: Patient tolerated treatment well Patient left: in chair;with call bell/phone within reach;with restraints reapplied;with chair alarm set Nurse Communication: Mobility status         Time: 1350-1413 PT Time Calculation (min) (ACUTE ONLY): 23 min   Charges:   PT Evaluation $PT Eval Moderate Complexity: 1 Procedure PT Treatments $Gait Training: 8-22 mins   PT G Codes:        Rhonda Mitchell 04/25/2016, 4:37 PM

## 2016-04-25 NOTE — Progress Notes (Signed)
PROGRESS NOTE    Rhonda Mitchell  ZOX:[id number] DOB: Oct 30, 1961 DOA: 04/20/2016 PCP: No primary care provider on file.  Outpatient Specialists:  Brief Narrative:  55 year old female admitted 5/1 w/ ETOH w/d. Transferred to SDU 5/3 for consideration for precedex gtt given escalated doses of ativan. Back to TRH on 04/25/2016  Assessment & Plan   Acute Encephalopathy -Resolved, likely secondary to alcohol withdrawal and DTs  Alcohol withdrawal/DTs -Alcohol level upon admission 123 -Patient initially placed on CIWA protocol however was requiring increased doses of Ativan. -Transferred to stepdown/PCCM on 04/22/2016, placed on Precedex -Has weaned off Precedex, and back on Ativan as needed -Continue close monitoring, currently in restraints -Social work and case management consulted -Continue folic acid, thiamine, multivitamin -patient was given haldol overnight  Depression -Cymbalta, benztropine restarted  Anemia of chronic disease -Hemoglobin currently stable, continue to monitor CBC  Mild thrombocytopenia -Resolved -Likely secondary to alcoholism  COPD -Currently stable, continue home medications  Deconditioning -PT consulted and pending  Positive C. difficile antigen -Currently on enteric precautions, no antibiotics as patient's, not having any diarrhea and toxin was negative  Elevated LFTs -Trending downward, likely secondary to alcoholism  DVT Prophylaxis  Heparin  Code Status: Full  Family Communication: none at bedside  Disposition Plan: Admitted. Continue to monitor in step down   Consultants PCCM  Procedures  None  Antibiotics   Anti-infectives    None      Subjective:   Rhonda Mitchell seen and examined today.  Patient states she would like to go home. Denies any chest pain, shortness of breath, abdominal pain, nausea or vomiting. Does still very sleepy this morning.  Objective:   Filed Vitals:   04/25/16 0900 04/25/16 0916 04/25/16 0922  04/25/16 1008  BP:    105/67  Pulse: 63     Temp:      TempSrc:      Resp: 15     Height:      Weight:      SpO2: 91% 94% 94%     Intake/Output Summary (Last 24 hours) at 04/25/16 1124 Last data filed at 04/25/16 1050  Gross per 24 hour  Intake    360 ml  Output   1100 ml  Net   -740 ml   Filed Weights   04/20/16 1815 04/25/16 0500  Weight: 52.6 kg (115 lb 15.4 oz) 54 kg (119 lb 0.8 oz)    Exam  General: Well developed, well nourished, NAD, appears stated age  HEENT: NCAT,  mucous membranes moist.   Cardiovascular: S1 S2 auscultated, no rubs, murmurs or gallops. Regular rate and rhythm.  Respiratory: Clear to auscultation bilaterally with equal chest rise  Abdomen: Soft, nontender, nondistended, + bowel sounds  Extremities: warm dry without cyanosis clubbing or edema. Currently in upper extremity restraints.  Neuro: AAOx3, nonfocal  Skin: Scattered ecchymosis  Psych: Appropriate, however mildly confused   Data Reviewed: I have personally reviewed following labs and imaging studies  CBC:  Recent Labs Lab 04/20/16 1343 04/21/16 0805 04/22/16 0718 04/23/16 0315 04/25/16 0320  WBC 7.8 4.1 6.9 6.3 7.1  NEUTROABS  --   --   --   --  4.6  HGB 9.3* 8.7* 9.5* 9.9* 9.2*  HCT 29.8* 27.8* 29.8* 31.9* 29.9*  MCV 92.8 90.6 89.8 92.5 94.9  PLT 179 130* 123* 125* 176   Basic Metabolic Panel:  Recent Labs Lab 04/20/16 1343 04/21/16 0805 04/22/16 0718 04/23/16 0315 04/25/16 0320  NA 137 137 134* 138 138  K 5.2* 3.6 3.9 3.7 4.0  CL 98* 101 97* 101 103  CO2 16* 24 21* 24 27  GLUCOSE 78 88 124* 83 116*  BUN 18 9 19 17 12   CREATININE 0.85 0.58 0.75 0.58 0.61  CALCIUM 8.1* 7.8* 9.4 9.0 8.4*  MG  --   --   --  1.6* 1.9  PHOS  --   --   --  3.7 4.8*   GFR: Estimated Creatinine Clearance: 57.7 mL/min (by C-G formula based on Cr of 0.61). Liver Function Tests:  Recent Labs Lab 04/20/16 1343 04/21/16 0805 04/22/16 0718 04/23/16 0315  AST 176* 92* 77*  75*  ALT 51 40 38 35  ALKPHOS 68 60 70 59  BILITOT 1.2 1.5* 0.9 1.0  PROT 7.8 6.9 7.7 7.1  ALBUMIN 4.1 3.6 3.9 3.7    Recent Labs Lab 04/20/16 1343 04/23/16 0315  LIPASE 42 19   No results for input(s): AMMONIA in the last 168 hours. Coagulation Profile:  Recent Labs Lab 04/20/16 2004  INR 1.01   Cardiac Enzymes: No results for input(s): CKTOTAL, CKMB, CKMBINDEX, TROPONINI in the last 168 hours. BNP (last 3 results) No results for input(s): PROBNP in the last 8760 hours. HbA1C: No results for input(s): HGBA1C in the last 72 hours. CBG: No results for input(s): GLUCAP in the last 168 hours. Lipid Profile: No results for input(s): CHOL, HDL, LDLCALC, TRIG, CHOLHDL, LDLDIRECT in the last 72 hours. Thyroid Function Tests: No results for input(s): TSH, T4TOTAL, FREET4, T3FREE, THYROIDAB in the last 72 hours. Anemia Panel: No results for input(s): VITAMINB12, FOLATE, FERRITIN, TIBC, IRON, RETICCTPCT in the last 72 hours. Urine analysis:    Component Value Date/Time   COLORURINE YELLOW 04/20/2016 1741   APPEARANCEUR CLEAR 04/20/2016 1741   LABSPEC 1.019 04/20/2016 1741   PHURINE 6.0 04/20/2016 1741   GLUCOSEU NEGATIVE 04/20/2016 1741   HGBUR NEGATIVE 04/20/2016 1741   BILIRUBINUR NEGATIVE 04/20/2016 1741   KETONESUR >80* 04/20/2016 1741   PROTEINUR NEGATIVE 04/20/2016 1741   UROBILINOGEN 1.0 08/14/2015 1140   NITRITE NEGATIVE 04/20/2016 1741   LEUKOCYTESUR NEGATIVE 04/20/2016 1741   Sepsis Labs: @LABRCNTIP (procalcitonin:4,lacticidven:4)  ) Recent Results (from the past 240 hour(s))  Urine culture     Status: None   Collection Time: 04/20/16  5:41 PM  Result Value Ref Range Status   Specimen Description URINE, RANDOM  Final   Special Requests NONE  Final   Culture   Final    NO GROWTH 1 DAY [facility]    Report Status 04/22/2016 FINAL  Final  C difficile quick scan w PCR reflex     Status: Abnormal   Collection Time: 04/21/16  8:00 PM    Result Value Ref Range Status   C Diff antigen POSITIVE (A) NEGATIVE Final   C Diff toxin NEGATIVE NEGATIVE Final   C Diff interpretation   Final    C. difficile present, but toxin not detected. This indicates colonization. In most cases, this does not require treatment. If patient has signs and symptoms consistent with colitis, consider treatment. Requires ENTERIC precautions.  MRSA PCR Screening     Status: None   Collection Time: 04/22/16 10:32 AM  Result Value Ref Range Status   MRSA by PCR NEGATIVE NEGATIVE Final    Comment:        The GeneXpert MRSA Assay (FDA approved for NASAL specimens only), is one component of a comprehensive MRSA colonization surveillance program. It is not intended to diagnose  MRSA infection nor to guide or monitor treatment for MRSA infections.       Radiology Studies: No results found.   Scheduled Meds: . arformoterol  15 mcg Nebulization BID  . benztropine  0.5 mg Oral BID  . budesonide (PULMICORT) nebulizer solution  0.5 mg Nebulization BID  . cloNIDine  0.1 mg Oral TID  . DULoxetine  30 mg Oral Daily  . enoxaparin (LOVENOX) injection  40 mg Subcutaneous Q24H  . feeding supplement (ENSURE ENLIVE)  237 mL Oral BID BM  . folic acid  1 mg Oral Daily  . gabapentin  300 mg Oral TID  . haloperidol  2 mg Oral TID  . multivitamin with minerals  1 tablet Oral Daily  . nicotine  14 mg Transdermal Daily  . pantoprazole  40 mg Oral BID AC  . sodium chloride flush  3 mL Intravenous Q12H  . thiamine  100 mg Oral Daily   Or  . thiamine  100 mg Intravenous Daily   Continuous Infusions:    LOS: 4 days   Time Spent in minutes   30 minutes  Remedios Mckone D.O. on 04/25/2016 at 11:24 AM  Between 7am to 7pm - Pager - [phone]  After 7pm go to [email or web address] - password [email or web address]  And look for the night coverage person covering for me after hours  [facility] Office  [phone]

## 2016-04-26 DIAGNOSIS — F10231 Alcohol dependence with withdrawal delirium: Secondary | ICD-10-CM

## 2016-04-26 LAB — CBC WITH DIFFERENTIAL/PLATELET
BASOS ABS: 0 10*3/uL (ref 0.0–0.1)
BASOS PCT: 0 %
EOS ABS: 0.2 10*3/uL (ref 0.0–0.7)
EOS PCT: 3 %
HCT: 26.9 % — ABNORMAL LOW (ref 36.0–46.0)
Hemoglobin: 8.3 g/dL — ABNORMAL LOW (ref 12.0–15.0)
LYMPHS PCT: 23 %
Lymphs Abs: 1.6 10*3/uL (ref 0.7–4.0)
MCH: 28.8 pg (ref 26.0–34.0)
MCHC: 30.9 g/dL (ref 30.0–36.0)
MCV: 93.4 fL (ref 78.0–100.0)
MONO ABS: 1.1 10*3/uL — AB (ref 0.1–1.0)
Monocytes Relative: 16 %
Neutro Abs: 4 10*3/uL (ref 1.7–7.7)
Neutrophils Relative %: 58 %
PLATELETS: 258 10*3/uL (ref 150–400)
RBC: 2.88 MIL/uL — ABNORMAL LOW (ref 3.87–5.11)
RDW: 20.5 % — AB (ref 11.5–15.5)
WBC: 6.9 10*3/uL (ref 4.0–10.5)

## 2016-04-26 LAB — COMPREHENSIVE METABOLIC PANEL
ALT: 60 U/L — ABNORMAL HIGH (ref 14–54)
AST: 105 U/L — ABNORMAL HIGH (ref 15–41)
Albumin: 3.5 g/dL (ref 3.5–5.0)
Alkaline Phosphatase: 63 U/L (ref 38–126)
Anion gap: 10 (ref 5–15)
BUN: 9 mg/dL (ref 6–20)
CHLORIDE: 102 mmol/L (ref 101–111)
CO2: 26 mmol/L (ref 22–32)
Calcium: 9.2 mg/dL (ref 8.9–10.3)
Creatinine, Ser: 0.64 mg/dL (ref 0.44–1.00)
GFR calc Af Amer: >60 mL/min (ref >60)
GFR calc non Af Amer: >60 mL/min (ref >60)
Glucose, Bld: 116 mg/dL — ABNORMAL HIGH (ref 65–99)
POTASSIUM: 4.2 mmol/L (ref 3.5–5.1)
Sodium: 138 mmol/L (ref 135–145)
Total Bilirubin: 0.5 mg/dL (ref 0.3–1.2)
Total Protein: 7.2 g/dL (ref 6.5–8.1)

## 2016-04-26 LAB — PHOSPHORUS: Phosphorus: 5.3 mg/dL — ABNORMAL HIGH (ref 2.5–4.6)

## 2016-04-26 LAB — MAGNESIUM: MAGNESIUM: 1.9 mg/dL (ref 1.7–2.4)

## 2016-04-26 MED ORDER — HALOPERIDOL LACTATE 5 MG/ML IJ SOLN
2.0000 mg | Freq: Once | INTRAMUSCULAR | Status: AC
  Administered 2016-04-26: 2 mg via INTRAVENOUS
  Filled 2016-04-26: qty 1

## 2016-04-26 MED ORDER — LORAZEPAM 2 MG/ML IJ SOLN: 2.0000 mg | INTRAMUSCULAR | Status: DC

## 2016-04-26 MED ORDER — FENTANYL CITRATE (PF) 100 MCG/2ML IJ SOLN
25.0000 ug | INTRAMUSCULAR | Status: DC | PRN
Start: 2016-04-26 — End: 2016-04-28
  Administered 2016-04-28: 25 ug via INTRAVENOUS
  Filled 2016-04-26: qty 2

## 2016-04-26 MED ORDER — HALOPERIDOL LACTATE 5 MG/ML IJ SOLN
INTRAMUSCULAR | Status: AC
  Administered 2016-04-26: 5 mg via INTRAVENOUS
  Filled 2016-04-26: qty 1

## 2016-04-26 MED ORDER — LORAZEPAM 2 MG/ML IJ SOLN
1.0000 mg | INTRAMUSCULAR | Status: DC
  Administered 2016-04-26 – 2016-04-27 (×5): 1 mg via INTRAVENOUS
  Filled 2016-04-26 (×5): qty 1

## 2016-04-26 MED ORDER — DIPHENHYDRAMINE HCL 50 MG/ML IJ SOLN
25.0000 mg | Freq: Once | INTRAMUSCULAR | Status: AC
  Administered 2016-04-26: 25 mg via INTRAVENOUS
  Filled 2016-04-26: qty 1

## 2016-04-26 MED ORDER — MIDAZOLAM HCL 2 MG/2ML IJ SOLN
1.0000 mg | INTRAMUSCULAR | Status: DC | PRN
  Administered 2016-04-26: 1 mg via INTRAVENOUS
  Administered 2016-04-26: 2 mg via INTRAVENOUS
  Administered 2016-04-27 (×2): 1 mg via INTRAVENOUS
  Administered 2016-04-28: 2 mg via INTRAVENOUS
  Filled 2016-04-26 (×5): qty 2

## 2016-04-26 MED ORDER — LORAZEPAM 1 MG PO TABS
2.0000 mg | ORAL_TABLET | Freq: Once | ORAL | Status: AC
  Administered 2016-04-26: 2 mg via ORAL
  Filled 2016-04-26: qty 2

## 2016-04-26 MED ORDER — HALOPERIDOL LACTATE 5 MG/ML IJ SOLN
5.0000 mg | Freq: Once | INTRAMUSCULAR | Status: AC
  Administered 2016-04-26 (×2): 5 mg via INTRAVENOUS

## 2016-04-26 MED ORDER — DEXMEDETOMIDINE HCL IN NACL 200 MCG/50ML IV SOLN
0.2000 ug/kg/h | INTRAVENOUS | Status: AC
  Administered 2016-04-26: 0.6 ug/kg/h via INTRAVENOUS
  Administered 2016-04-26 – 2016-04-27 (×2): 0.4 ug/kg/h via INTRAVENOUS
  Filled 2016-04-26 (×3): qty 50

## 2016-04-26 MED ORDER — LORAZEPAM 2 MG/ML IJ SOLN
INTRAMUSCULAR | Status: AC
  Administered 2016-04-26: 2 mg
  Filled 2016-04-26: qty 1

## 2016-04-26 MED ORDER — LORAZEPAM 2 MG/ML IJ SOLN
2.0000 mg | Freq: Once | INTRAMUSCULAR | Status: AC
  Administered 2016-04-26: 2 mg via INTRAMUSCULAR

## 2016-04-26 NOTE — Progress Notes (Signed)
Patient continues to be restrained due to agitation and anxiety.  Continues to try and get OOB.  CVC inserted per Dr. Tyson AliasFeinstein in Right femoral.  Dressing applied to site.  Precedex started per order at 0.4 mg/kg.  Versed 2 mg IV given for agitation with good result. Resting quietly.  Foley inserted per order.  Urine returned when foley inserted.  Continue to monitor patient closely. Seletha Zimmermann Debroah LoopArnold RN

## 2016-04-26 NOTE — Progress Notes (Signed)
Called Elink, Dr. Donette LarrySomer about the patient not having any access to give medication.  Was informed that the dr. Would return my call at some point.  The patient had an IV in her Right thumb, but the IV will not flush.  The patient continues to be restrained and cursing at nurse and sitter in the room.  The patient wants a cigerette and something to drink, and to go home.  Continue to monitor the patient closely.  Tanice Petre Debroah LoopArnold RN

## 2016-04-26 NOTE — Progress Notes (Signed)
Pt remains agitated at times trying to get oob and uses foul language.  Precedex drip infusing per orders.

## 2016-04-26 NOTE — Progress Notes (Signed)
Pt being physically and verbally  abusive to staff.  Pt continuously trying to get out of bed. Pt keeping yelling "let me go out side and get my money. The black Zenaida Niecevan is here to get me." Writer attempted to reorient pt with out success. MD made aware of pt behaviors. New order for Haldol placed in Epic and followed out. Haldol has been ineffective at this time. MD placed new order to transfer pt to stepdown. Will continue to monitor.

## 2016-04-26 NOTE — Procedures (Signed)
Central Venous Catheter Insertion Procedure Note Sherrilee GillesMichelle Stirewalt [id number] 12/05/1961  Procedure: Insertion of Central Venous Catheter Indications: Assessment of intravascular volume  Procedure Details Consent: Unable to obtain consent because of emergent medical necessity. Time Out: Verified patient identification, verified procedure, site/side was marked, verified correct patient position, special equipment/implants available, medications/allergies/relevent history reviewed, required imaging and test results available.  Performed  Maximum sterile technique was used including antiseptics, cap, gloves, gown, hand hygiene, mask and sheet. Skin prep: Chlorhexidine; local anesthetic administered A antimicrobial bonded/coated triple lumen catheter was placed in the right femoral vein due to emergent situation using the Seldinger technique.  Evaluation Blood flow good Complications: No apparent complications Patient did tolerate procedure well. Chest X-ray ordered to verify placement.  CXR: normal.  Wilmarie Sparlin J. 04/26/2016, 6:21 PM  US  No access prior, harm to self, unable to lay gflat for IJ  Mcarthur RossettiDaniel J. Tyson AliasFeinstein, MD, FACP Pgr: [phone] [facility] '

## 2016-04-26 NOTE — Progress Notes (Signed)
PROGRESS NOTE    Rhonda GillesMichelle Mitchell  ZOX:[id number] DOB: 09/03/1961 DOA: 04/20/2016 PCP: No primary care provider on file.  Outpatient Specialists:  Brief Narrative:  55 year old female admitted 5/1 w/ ETOH w/d. Transferred to SDU 5/3 for consideration for precedex gtt given escalated doses of ativan. Back to TRH on 04/25/2016  Assessment & Plan   Acute Encephalopathy -Resolved, likely secondary to alcohol withdrawal and DTs  Alcohol withdrawal/DTs -Alcohol level upon admission 123 -Patient initially placed on CIWA protocol however was requiring increased doses of Ativan. -Transferred to stepdown/PCCM on 04/22/2016, placed on Precedex -Has weaned off Precedex -Continue close monitoring, currently in restraints -Social work and case management consulted -Continue folic acid, thiamine, multivitamin -Continue haldol -Will add on scheduled ativan  Depression -Cymbalta, benztropine restarted  Anemia of chronic disease -Baseline Hemoglobin between 8-9 -currently stable, Hb 8.3  -continue to monitor CBC  Mild thrombocytopenia -Resolved -Likely secondary to alcoholism  COPD -Currently stable, continue home medications  Deconditioning -PT consulted and pending  Positive C. difficile antigen -Currently on enteric precautions, no antibiotics as patient's, not having any diarrhea and toxin was negative  Elevated LFTs -Trending downward, likely secondary to alcoholism  DVT Prophylaxis  Heparin  Code Status: Full  Family Communication: none at bedside  Disposition Plan: Admitted. Patient was calm and transferred to tele, however, became more agitated and will be transferred back to step down for close monitoring.   Consultants PCCM  Procedures  None  Antibiotics   Anti-infectives    None      Subjective:   Rhonda GillesMichelle Mitchell seen and examined today.  Patient states she "all of by joints are shot, I'm weak." Then states she needs to get home to cook. Denies any  chest pain, shortness of breath, abdominal pain, nausea or vomiting. Patient upset that she cannot get out of bed.  Objective:   Filed Vitals:   04/26/16 0600 04/26/16 0735 04/26/16 0800 04/26/16 0910  BP:  179/60 158/85 142/71  Pulse: 85 72 83 60  Temp:    98.6 F (37 C)  TempSrc:    Oral  Resp:  27 21 20   Height:      Weight:      SpO2:  100% 99% 100%    Intake/Output Summary (Last 24 hours) at 04/26/16 1031 Last data filed at 04/26/16 0400  Gross per 24 hour  Intake      0 ml  Output    900 ml  Net   -900 ml   Filed Weights   04/20/16 1815 04/25/16 0500  Weight: 52.6 kg (115 lb 15.4 oz) 54 kg (119 lb 0.8 oz)    Exam  General: Well developed, well nourished, NAD  HEENT: NCAT,  mucous membranes moist.   Cardiovascular: S1 S2 auscultated, RRR, no murmurs  Respiratory: Clear to auscultation bilaterally  Abdomen: Soft, nontender, nondistended, + bowel sounds  Extremities: warm dry without cyanosis clubbing or edema. Currently in upper extremity restraints.  Neuro: AAOx2 (to place and self, not time), nonfocal  Skin: Scattered ecchymosis  Psych: Appropriate, but confused   Data Reviewed: I have personally reviewed following labs and imaging studies  CBC:  Recent Labs Lab 04/21/16 0805 04/22/16 0718 04/23/16 0315 04/25/16 0320 04/26/16 0324  WBC 4.1 6.9 6.3 7.1 6.9  NEUTROABS  --   --   --  4.6 4.0  HGB 8.7* 9.5* 9.9* 9.2* 8.3*  HCT 27.8* 29.8* 31.9* 29.9* 26.9*  MCV 90.6 89.8 92.5 94.9 93.4  PLT 130* 123*  125* 176 258   Basic Metabolic Panel:  Recent Labs Lab 04/21/16 0805 04/22/16 0718 04/23/16 0315 04/25/16 0320 04/26/16 0324  NA 137 134* 138 138 138  K 3.6 3.9 3.7 4.0 4.2  CL 101 97* 101 103 102  CO2 24 21* GLUCOSE 88 124* 83 116* 116*  BUN CREATININE 0.58 0.75 0.58 0.61 0.64  CALCIUM 7.8* 9.4 9.0 8.4* 9.2  MG  --   --  1.6* 1.9 1.9  PHOS  --   --  3.7 4.8* 5.3*   GFR: Estimated Creatinine Clearance: 57.7  mL/min (by C-G formula based on Cr of 0.64). Liver Function Tests:  Recent Labs Lab 04/20/16 1343 04/21/16 0805 04/22/16 0718 04/23/16 0315 04/26/16 0324  AST 176* 92* 77* 75* 105*  ALT 51 40 38 35 60*  ALKPHOS 68 60 70 59 63  BILITOT 1.2 1.5* 0.9 1.0 0.5  PROT 7.8 6.9 7.7 7.1 7.2  ALBUMIN 4.1 3.6 3.9 3.7 3.5    Recent Labs Lab 04/20/16 1343 04/23/16 0315  LIPASE 42 19   No results for input(s): AMMONIA in the last 168 hours. Coagulation Profile:  Recent Labs Lab 04/20/16 2004  INR 1.01   Cardiac Enzymes: No results for input(s): CKTOTAL, CKMB, CKMBINDEX, TROPONINI in the last 168 hours. BNP (last 3 results) No results for input(s): PROBNP in the last 8760 hours. HbA1C: No results for input(s): HGBA1C in the last 72 hours. CBG: No results for input(s): GLUCAP in the last 168 hours. Lipid Profile: No results for input(s): CHOL, HDL, LDLCALC, TRIG, CHOLHDL, LDLDIRECT in the last 72 hours. Thyroid Function Tests: No results for input(s): TSH, T4TOTAL, FREET4, T3FREE, THYROIDAB in the last 72 hours. Anemia Panel: No results for input(s): VITAMINB12, FOLATE, FERRITIN, TIBC, IRON, RETICCTPCT in the last 72 hours. Urine analysis:    Component Value Date/Time   COLORURINE YELLOW 04/20/2016 1741   APPEARANCEUR CLEAR 04/20/2016 1741   LABSPEC 1.019 04/20/2016 1741   PHURINE 6.0 04/20/2016 1741   GLUCOSEU NEGATIVE 04/20/2016 1741   HGBUR NEGATIVE 04/20/2016 1741   BILIRUBINUR NEGATIVE 04/20/2016 1741   KETONESUR >80* 04/20/2016 1741   PROTEINUR NEGATIVE 04/20/2016 1741   UROBILINOGEN 1.0 08/14/2015 1140   NITRITE NEGATIVE 04/20/2016 1741   LEUKOCYTESUR NEGATIVE 04/20/2016 1741   Sepsis Labs: (procalcitonin:4,lacticidven:4)  ) Recent Results (from the past 240 hour(s))  Urine culture     Status: None   Collection Time: 04/20/16  5:41 PM  Result Value Ref Range Status   Specimen Description URINE, RANDOM  Final   Special Requests NONE  Final    Culture   Final    NO GROWTH 1 DAY Performed at [facility]    Report Status 04/22/2016 FINAL  Final  C difficile quick scan w PCR reflex     Status: Abnormal   Collection Time: 04/21/16  8:00 PM  Result Value Ref Range Status   C Diff antigen POSITIVE (A) NEGATIVE Final   C Diff toxin NEGATIVE NEGATIVE Final   C Diff interpretation   Final    C. difficile present, but toxin not detected. This indicates colonization. In most cases, this does not require treatment. If patient has signs and symptoms consistent with colitis, consider treatment. Requires ENTERIC precautions.  MRSA PCR Screening     Status: None   Collection Time: 04/22/16 10:32 AM  Result Value Ref Range Status   MRSA by PCR NEGATIVE NEGATIVE Final    Comment:  The GeneXpert MRSA Assay (FDA approved for NASAL specimens only), is one component of a comprehensive MRSA colonization surveillance program. It is not intended to diagnose MRSA infection nor to guide or monitor treatment for MRSA infections.       Radiology Studies: No results found.   Scheduled Meds: . arformoterol  15 mcg Nebulization BID  . benztropine  0.5 mg Oral BID  . budesonide (PULMICORT) nebulizer solution  0.5 mg Nebulization BID  . cloNIDine  0.1 mg Oral TID  . DULoxetine  30 mg Oral Daily  . enoxaparin (LOVENOX) injection  40 mg Subcutaneous Q24H  . feeding supplement (ENSURE ENLIVE)  237 mL Oral BID BM  . folic acid  1 mg Oral Daily  . gabapentin  300 mg Oral TID  . haloperidol  2 mg Oral TID  . LORazepam  1 mg Intravenous Q4H  . multivitamin with minerals  1 tablet Oral Daily  . nicotine  14 mg Transdermal Daily  . pantoprazole  40 mg Oral BID AC  . sodium chloride flush  3 mL Intravenous Q12H  . thiamine  100 mg Oral Daily   Or  . thiamine  100 mg Intravenous Daily   Continuous Infusions:    LOS: 5 days   Time Spent in minutes   30 minutes  Kareena Arrambide D.O. on 04/26/2016 at 10:31 AM  Between 7am to 7pm  - Pager - [phone]  After 7pm go to [email or web address] - password [email or web address]  And look for the night coverage person covering for me after hours  [facility] Office  [phone]

## 2016-04-27 DIAGNOSIS — J432 Centrilobular emphysema: Secondary | ICD-10-CM

## 2016-04-27 DIAGNOSIS — G9340 Encephalopathy, unspecified: Secondary | ICD-10-CM | POA: Insufficient documentation

## 2016-04-27 LAB — MAGNESIUM: MAGNESIUM: 2 mg/dL (ref 1.7–2.4)

## 2016-04-27 LAB — CBC WITH DIFFERENTIAL/PLATELET
BASOS ABS: 0.1 10*3/uL (ref 0.0–0.1)
BASOS PCT: 1 %
EOS ABS: 0.2 10*3/uL (ref 0.0–0.7)
EOS PCT: 5 %
HCT: 29.3 % — ABNORMAL LOW (ref 36.0–46.0)
Hemoglobin: 9.1 g/dL — ABNORMAL LOW (ref 12.0–15.0)
Lymphocytes Relative: 29 %
Lymphs Abs: 1.5 10*3/uL (ref 0.7–4.0)
MCH: 29.1 pg (ref 26.0–34.0)
MCHC: 31.1 g/dL (ref 30.0–36.0)
MCV: 93.6 fL (ref 78.0–100.0)
Monocytes Absolute: 0.8 10*3/uL (ref 0.1–1.0)
Monocytes Relative: 16 %
Neutro Abs: 2.5 10*3/uL (ref 1.7–7.7)
Neutrophils Relative %: 49 %
PLATELETS: 366 10*3/uL (ref 150–400)
RBC: 3.13 MIL/uL — AB (ref 3.87–5.11)
RDW: 20.4 % — ABNORMAL HIGH (ref 11.5–15.5)
WBC: 5.1 10*3/uL (ref 4.0–10.5)

## 2016-04-27 LAB — BASIC METABOLIC PANEL
ANION GAP: 12 (ref 5–15)
BUN: 9 mg/dL (ref 6–20)
CALCIUM: 9.2 mg/dL (ref 8.9–10.3)
CO2: 25 mmol/L (ref 22–32)
Chloride: 105 mmol/L (ref 101–111)
Creatinine, Ser: 0.65 mg/dL (ref 0.44–1.00)
Glucose, Bld: 111 mg/dL — ABNORMAL HIGH (ref 65–99)
POTASSIUM: 4 mmol/L (ref 3.5–5.1)
SODIUM: 142 mmol/L (ref 135–145)

## 2016-04-27 LAB — PHOSPHORUS: PHOSPHORUS: 6 mg/dL — AB (ref 2.5–4.6)

## 2016-04-27 MED ORDER — CLONIDINE HCL 0.1 MG PO TABS
0.2000 mg | ORAL_TABLET | Freq: Three times a day (TID) | ORAL | Status: DC
  Administered 2016-04-27 – 2016-04-28 (×3): 0.2 mg via ORAL
  Filled 2016-04-27 (×4): qty 2

## 2016-04-27 MED ORDER — LORAZEPAM 2 MG/ML IJ SOLN
2.0000 mg | INTRAMUSCULAR | Status: DC
  Administered 2016-04-27 – 2016-04-30 (×14): 2 mg via INTRAVENOUS
  Filled 2016-04-27 (×14): qty 1

## 2016-04-27 NOTE — Progress Notes (Addendum)
PROGRESS NOTE  Rhonda Mitchell XLK:[id number] DOB: 11-04-1961 DOA: 04/20/2016 PCP: No primary care provider on file.  HPI/Recap of past 20 hours: 55 year old female admitted on 5/1 for alcohol detox. Patient reportedly drinks several pints of alcohol daily. She became quite agitated requiring IV Ativan and eventual Precedex drip. Crit will care to cover, and patient able to be weaned off of drip and hospitalist reassumed care on 5/5. Overnight, patient came more agitated requiring Precedex drip to be restarted. Currently somnolent.    Assessment/Plan: Principal Problem:   Alcohol withdrawal (HCC) secondary to alcohol abuse causing acute encephalopathy: Currently on Precedex drip. Critical care following Active Problems:   Tachycardia: From withdrawals   Chronic hepatitis C without hepatic coma (HCC)   Elevated LFTs   Abdominal pain, vomiting, and diarrhea   Code Status: Full code   Family Communication: Left message for family   Disposition Plan: Continue in unit   Consultants:  Critical care   Procedures:  None  Antimicrobials:  None   DVT prophylaxis:  Lovenox Objective: Filed Vitals:   04/27/16 0800 04/27/16 0900 04/27/16 1000 04/27/16 1100  BP: 129/55 150/67 147/62 164/68  Pulse: 55 53 55 53  Temp:      TempSrc:      Resp: Height:      Weight:      SpO2: 93% 95% 92% 93%    Intake/Output Summary (Last 24 hours) at 04/27/16 1111 Last data filed at 04/27/16 1100  Gross per 24 hour  Intake 402.55 ml  Output   1075 ml  Net -672.45 ml   Filed Weights   04/20/16 1815 04/25/16 0500  Weight: 52.6 kg (115 lb 15.4 oz) 54 kg (119 lb 0.8 oz)    Exam:   General:  Somnolent   Cardiovascular: Regular rate and rhythm   Respiratory: Clear to auscultation bilaterally   Abdomen: Soft, nontender, nondistended, positive bowel sounds   Musculoskeletal: No clubbing or cyanosis or edema   Skin: No skin breaks, tears or lesions  Psychiatry:  Somnolent currently    Data Reviewed: CBC:  Recent Labs Lab 04/22/16 0718 04/23/16 0315 04/25/16 0320 04/26/16 0324 04/27/16 0421  WBC 6.9 6.3 7.1 6.9 5.1  NEUTROABS  --   --  4.6 4.0 2.5  HGB 9.5* 9.9* 9.2* 8.3* 9.1*  HCT 29.8* 31.9* 29.9* 26.9* 29.3*  MCV 89.8 92.5 94.9 93.4 93.6  PLT 123* 125* 176 258 366   Basic Metabolic Panel:  Recent Labs Lab 04/22/16 0718 04/23/16 0315 04/25/16 0320 04/26/16 0324 04/27/16 0421  NA 134* 138 138 138 142  K 3.9 3.7 4.0 4.2 4.0  CL 97* 101 103 102 105  CO2 21* GLUCOSE 124* 83 116* 116* 111*  BUN CREATININE 0.75 0.58 0.61 0.64 0.65  CALCIUM 9.4 9.0 8.4* 9.2 9.2  MG  --  1.6* 1.9 1.9 2.0  PHOS  --  3.7 4.8* 5.3* 6.0*   GFR: Estimated Creatinine Clearance: 57.7 mL/min (by C-G formula based on Cr of 0.65). Liver Function Tests:  Recent Labs Lab 04/20/16 1343 04/21/16 0805 04/22/16 0718 04/23/16 0315 04/26/16 0324  AST 176* 92* 77* 75* 105*  ALT 51 40 38 35 60*  ALKPHOS 68 60 70 59 63  BILITOT 1.2 1.5* 0.9 1.0 0.5  PROT 7.8 6.9 7.7 7.1 7.2  ALBUMIN 4.1 3.6 3.9 3.7 3.5    Recent Labs Lab 04/20/16 1343 04/23/16 0315  LIPASE  42 19   No results for input(s): AMMONIA in the last 168 hours. Coagulation Profile:  Recent Labs Lab 04/20/16 2004  INR 1.01   Cardiac Enzymes: No results for input(s): CKTOTAL, CKMB, CKMBINDEX, TROPONINI in the last 168 hours. BNP (last 3 results) No results for input(s): PROBNP in the last 8760 hours. HbA1C: No results for input(s): HGBA1C in the last 72 hours. CBG: No results for input(s): GLUCAP in the last 168 hours. Lipid Profile: No results for input(s): CHOL, HDL, LDLCALC, TRIG, CHOLHDL, LDLDIRECT in the last 72 hours. Thyroid Function Tests: No results for input(s): TSH, T4TOTAL, FREET4, T3FREE, THYROIDAB in the last 72 hours. Anemia Panel: No results for input(s): VITAMINB12, FOLATE, FERRITIN, TIBC, IRON, RETICCTPCT in the last 72 hours. Urine  analysis:    Component Value Date/Time   COLORURINE YELLOW 04/20/2016 1741   APPEARANCEUR CLEAR 04/20/2016 1741   LABSPEC 1.019 04/20/2016 1741   PHURINE 6.0 04/20/2016 1741   GLUCOSEU NEGATIVE 04/20/2016 1741   HGBUR NEGATIVE 04/20/2016 1741   BILIRUBINUR NEGATIVE 04/20/2016 1741   KETONESUR >80* 04/20/2016 1741   PROTEINUR NEGATIVE 04/20/2016 1741   UROBILINOGEN 1.0 08/14/2015 1140   NITRITE NEGATIVE 04/20/2016 1741   LEUKOCYTESUR NEGATIVE 04/20/2016 1741   Sepsis Labs: @LABRCNTIP (procalcitonin:4,lacticidven:4)  ) Recent Results (from the past 240 hour(s))  Urine culture     Status: None   Collection Time: 04/20/16  5:41 PM  Result Value Ref Range Status   Specimen Description URINE, RANDOM  Final   Special Requests NONE  Final   Culture   Final    NO GROWTH 1 DAY Per[facility]    Report Status 04/22/2016 FINAL  Final  C difficile quick scan w PCR reflex     Status: Abnormal   Collection Time: 04/21/16  8:00 PM  Result Value Ref Range Status   C Diff antigen POSITIVE (A) NEGATIVE Final   C Diff toxin NEGATIVE NEGATIVE Final   C Diff interpretation   Final    C. difficile present, but toxin not detected. This indicates colonization. In most cases, this does not require treatment. If patient has signs and symptoms consistent with colitis, consider treatment. Requires ENTERIC precautions.  MRSA PCR Screening     Status: None   Collection Time: 04/22/16 10:32 AM  Result Value Ref Range Status   MRSA by PCR NEGATIVE NEGATIVE Final    Comment:        The GeneXpert MRSA Assay (FDA approved for NASAL specimens only), is one component of a comprehensive MRSA colonization surveillance program. It is not intended to diagnose MRSA infection nor to guide or monitor treatment for MRSA infections.       Studies: No results found.  Scheduled Meds: . arformoterol  15 mcg Nebulization BID  . benztropine  0.5 mg Oral BID  . budesonide (PULMICORT)  nebulizer solution  0.5 mg Nebulization BID  . cloNIDine  0.2 mg Oral TID  . DULoxetine  30 mg Oral Daily  . enoxaparin (LOVENOX) injection  40 mg Subcutaneous Q24H  . feeding supplement (ENSURE ENLIVE)  237 mL Oral BID BM  . folic acid  1 mg Oral Daily  . gabapentin  300 mg Oral TID  . haloperidol  2 mg Oral TID  . LORazepam  1 mg Intravenous Q4H  . multivitamin with minerals  1 tablet Oral Daily  . nicotine  14 mg Transdermal Daily  . pantoprazole  40 mg Oral BID AC  . sodium chloride flush  3 mL Intravenous Q12H  . thiamine  100 mg Oral Daily   Or  . thiamine  100 mg Intravenous Daily    Continuous Infusions: . dexmedetomidine 0.3 mcg/kg/hr (04/27/16 1100)     LOS: 6 days   Time spent: 15 minutes  Hollice Espy, MD [facility] Pager [phone]  If 7PM-7AM, please contact night-coverage [email or web address] Password [email or web address] 04/27/2016, 11:11 AM

## 2016-04-27 NOTE — Progress Notes (Signed)
PT Cancellation Note  Patient Details Name: Rhonda GillesMichelle Mitchell MRN: [id number] DOB: [birth year]   Cancelled Treatment:    Reason Eval/Treat Not Completed: Other (comment) (has been agitated this morning, RN requests not to work with pt at this time.)   Mckenze Slone,KATHrine E 04/27/2016, 10:49 AM

## 2016-04-27 NOTE — Progress Notes (Signed)
   Name: Rhonda Mitchell MRN: [id number] DOB: May 11, 1961    ADMISSION DATE:  04/20/2016 CONSULTATION DATE: 5/3  REFERRING MD :  Jerral RalphGhimire  CHIEF COMPLAINT:  ETOH w/d  BRIEF PATIENT DESCRIPTION:   55 year old female admitted 5/1 w/ ETOH w/d. Transferred to SDU 5/3 for consideration for precedex gtt given escalated doses of ativan.   SIGNIFICANT EVENTS    STUDIES:    SUBJECTIVE:  Sedated; ready to wean precedex   VITAL SIGNS: Temp:  [97.4 F (36.3 C)-98.6 F (37 C)] 97.4 F (36.3 C) (05/08 0348) Pulse Rate:  [52-96] 53 (05/08 0900) Resp:  [14-31] 25 (05/08 0900) BP: (101-181)/(41-150) 150/67 mmHg (05/08 0900) SpO2:  [89 %-100 %] 95 % (05/08 0900) 2 liter  PHYSICAL EXAMINATION: General:  Chronically ill appearing white female, now sedated on precedex Neuro:  awake, confused. No focal def, generalized weakness HEENT:  Edentulous, no JVD   Cardiovascular:  RRR, no MRG Lungs:  Scattered rhonchi improved  Abdomen:  Soft, not tender  Musculoskeletal:  Intact  Skin:  Scattered area of ecchymosis    Recent Labs Lab 04/25/16 0320 04/26/16 0324 04/27/16 0421  NA 138 138 142  K 4.0 4.2 4.0  CL 103 102 105  CO2 27 26 25   BUN 12 9 9   CREATININE 0.61 0.64 0.65  GLUCOSE 116* 116* 111*    Recent Labs Lab 04/25/16 0320 04/26/16 0324 04/27/16 0421  HGB 9.2* 8.3* 9.1*  HCT 29.9* 26.9* 29.3*  WBC 7.1 6.9 5.1  PLT 176 258 366   No results found.  ASSESSMENT / PLAN:  Acute encephalopathy in setting of Delirium tremens and ETOH w/d  Placed back on Precedex last evening.  Plan Added back her home anti-psychotic and depression rx on 5/6; these need to be continued.  Adjust up clonidine  Hope dc precedex today  Cont low dose lorazepam PRN Cont folic acid and thiamine SDU monitoring  May need safety sitter  Deconditioning  Plan PT consult   H/o COPD Plan Nebulized brovana and budesonide  Anemia of chronic disease; mild thrombocytopenia  Plan Trend cbc [place]  heparin  Simonne MartinetPeter E Sinda Leedom ACNP-BC [facility]Critical [facility] Pager # [phone] OR # [phone] if no answer   04/27/2016, 10:11 AM

## 2016-04-27 NOTE — Progress Notes (Addendum)
Date:  Apr 27, 2016 Chart reviewed for concurrent status and case management needs. Will continue to follow patient for changes and needs: iv precedex for sedation. Expected discharge date: [id number] Marcelle SmilingRhonda Maame Dack, BSN, [place]3, [place]CCM   [phone]

## 2016-04-28 MED ORDER — SODIUM CHLORIDE 0.9% FLUSH
10.0000 mL | Freq: Two times a day (BID) | INTRAVENOUS | Status: DC
  Administered 2016-04-28 – 2016-05-09 (×19): 10 mL

## 2016-04-28 MED ORDER — SODIUM CHLORIDE 0.9% FLUSH
10.0000 mL | INTRAVENOUS | Status: DC | PRN
  Administered 2016-04-29: 10 mL
  Administered 2016-05-02: 20 mL
  Filled 2016-04-28 (×2): qty 40

## 2016-04-28 MED ORDER — KETOROLAC TROMETHAMINE 30 MG/ML IJ SOLN
30.0000 mg | Freq: Once | INTRAMUSCULAR | Status: AC
  Administered 2016-04-28: 30 mg via INTRAVENOUS
  Filled 2016-04-28: qty 1

## 2016-04-28 MED ORDER — SODIUM CHLORIDE 0.9 % IV BOLUS (SEPSIS)
500.0000 mL | Freq: Once | INTRAVENOUS | Status: AC
  Administered 2016-04-28: 500 mL via INTRAVENOUS

## 2016-04-28 NOTE — Progress Notes (Signed)
   Name: Rhonda Mitchell MRN: [id number] DOB: July 17, 1961    ADMISSION DATE:  04/20/2016 CONSULTATION DATE: 5/3  REFERRING MD :  Jerral RalphGhimire  CHIEF COMPLAINT:  ETOH w/d  BRIEF PATIENT DESCRIPTION:   55 year old female admitted 5/1 w/ ETOH w/d. Transferred to SDU 5/3 for consideration for precedex gtt given escalated doses of ativan.   SIGNIFICANT EVENTS    STUDIES:  SUBJECTIVE:  No distress. Still impulsive   VITAL SIGNS: Temp:  [97.3 F (36.3 C)-98.2 F (36.8 C)] 98.2 F (36.8 C) (05/09 0816) Pulse Rate:  [50-119] 68 (05/09 0800) Resp:  [12-31] 19 (05/09 0800) BP: (82-164)/(27-80) 99/80 mmHg (05/09 0800) SpO2:  [89 %-100 %] 94 % (05/09 0800) 2 liter  PHYSICAL EXAMINATION: General:  Chronically ill appearing white female, awake and cooperative  Neuro:  awake, confused. No focal def, generalized weakness HEENT:  Edentulous, no JVD   Cardiovascular:  RRR, no MRG Lungs:  Clear w/ occ rhonchi  Abdomen:  Soft, not tender  Musculoskeletal:  Intact  Skin:  Scattered area of ecchymosis    Recent Labs Lab 04/25/16 0320 04/26/16 0324 04/27/16 0421  NA 138 138 142  K 4.0 4.2 4.0  CL 103 102 105  CO2 27 26 25   BUN 12 9 9   CREATININE 0.61 0.64 0.65  GLUCOSE 116* 116* 111*    Recent Labs Lab 04/25/16 0320 04/26/16 0324 04/27/16 0421  HGB 9.2* 8.3* 9.1*  HCT 29.9* 26.9* 29.3*  WBC 7.1 6.9 5.1  PLT 176 258 366   No results found.  ASSESSMENT / PLAN:  Acute encephalopathy in setting of Delirium tremens and ETOH w/d  Placed back on Precedex last evening.  Plan Added back her home anti-psychotic and depression rx on 5/6; these need to be continued.  Adjusted up clonidine  Cont low dose lorazepam PRN Cont folic acid and thiamine SDU monitoring  May need safety sitter  Deconditioning  Plan PT consult   H/o COPD Plan Nebulized brovana and budesonide  Anemia of chronic disease; mild thrombocytopenia  Plan Trend cbc [place] heparin  We will s/o   Simonne MartinetPeter E  Hollynn Garno ACNP-BC [facility]Critical [facility] Pager # [phone] OR # [phone] if no answer   04/28/2016, 10:06 AM

## 2016-04-28 NOTE — Progress Notes (Signed)
PROGRESS NOTE  Rhonda Mitchell ZOX:[id number] DOB: 1960/12/25 DOA: 04/20/2016 PCP: No primary care provider on file.  HPI/Recap of past 31 hours: 55 year old female admitted on 5/1 for alcohol detox. Patient reportedly drinks several pints of alcohol daily. She became quite agitated requiring IV Ativan and eventual Precedex drip. Crit will care to cover, and patient able to be weaned off of drip and hospitalist reassumed care on 5/5. On night of 5/7, patient came more agitated requiring Precedex drip to be restarted. Able to weaned off by early morning of 5/9.    This morning, patient is more awake, interactive.  Feeling better than on admission, still weak.    Assessment/Plan: Principal Problem:   Alcohol withdrawal (HCC) secondary to alcohol abuse causing acute encephalopathy:  Precedex drip able to be weaned off. Critical care signed off.  PT to see.  Patient herself encouraged to quit, does not want to live like this Active Problems:   Tachycardia: From withdrawals   Chronic hepatitis C without hepatic coma (HCC)   Elevated LFTs   Abdominal pain, vomiting, and diarrhea   Code Status: Full code   Family Communication: Left message for family   Disposition Plan: Continue in unit, in case of need for Ativan drip to be restarted. Potential discharge in next 24 hours   Consultants:  Critical care   Procedures:  None  Antimicrobials:  None   DVT prophylaxis:  Lovenox Objective: Filed Vitals:   04/28/16 0816 04/28/16 1000 04/28/16 1112 04/28/16 1200  BP:  124/42  107/65  Pulse:  60  57  Temp: 98.2 F (36.8 C)  97.4 F (36.3 C)   TempSrc: Axillary  Oral   Resp:    22  Height:      Weight:      SpO2:  93%  91%    Intake/Output Summary (Last 24 hours) at 04/28/16 1250 Last data filed at 04/28/16 1057  Gross per 24 hour  Intake 1700.3 ml  Output    725 ml  Net  975.3 ml   Filed Weights   04/20/16 1815 04/25/16 0500  Weight: 52.6 kg (115 lb 15.4 oz) 54 kg  (119 lb 0.8 oz)    Exam:   General:  Alert and oriented 3, no acute distress  Cardiovascular: Regular rate and rhythm   Respiratory: Clear to auscultation bilaterally   Abdomen: Soft, nontender, nondistended, positive bowel sounds   Musculoskeletal: No clubbing or cyanosis or edema   Skin: No skin breaks, tears or lesions  Psychiatry: Patient is appropriate, no evidence of psychoses    Data Reviewed: CBC:  Recent Labs Lab 04/22/16 0718 04/23/16 0315 04/25/16 0320 04/26/16 0324 04/27/16 0421  WBC 6.9 6.3 7.1 6.9 5.1  NEUTROABS  --   --  4.6 4.0 2.5  HGB 9.5* 9.9* 9.2* 8.3* 9.1*  HCT 29.8* 31.9* 29.9* 26.9* 29.3*  MCV 89.8 92.5 94.9 93.4 93.6  PLT 123* 125* 176 258 366   Basic Metabolic Panel:  Recent Labs Lab 04/22/16 0718 04/23/16 0315 04/25/16 0320 04/26/16 0324 04/27/16 0421  NA 134* 138 138 138 142  K 3.9 3.7 4.0 4.2 4.0  CL 97* 101 103 102 105  CO2 21* GLUCOSE 124* 83 116* 116* 111*  BUN CREATININE 0.75 0.58 0.61 0.64 0.65  CALCIUM 9.4 9.0 8.4* 9.2 9.2  MG  --  1.6* 1.9 1.9 2.0  PHOS  --  3.7 4.8* 5.3* 6.0*   GFR:  Estimated Creatinine Clearance: 57.7 mL/min (by C-G formula based on Cr of 0.65). Liver Function Tests:  Recent Labs Lab 04/22/16 0718 04/23/16 0315 04/26/16 0324  AST 77* 75* 105*  ALT 38 35 60*  ALKPHOS 70 59 63  BILITOT 0.9 1.0 0.5  PROT 7.7 7.1 7.2  ALBUMIN 3.9 3.7 3.5    Recent Labs Lab 04/23/16 0315  LIPASE 19   No results for input(s): AMMONIA in the last 168 hours. Coagulation Profile: No results for input(s): INR, PROTIME in the last 168 hours. Cardiac Enzymes: No results for input(s): CKTOTAL, CKMB, CKMBINDEX, TROPONINI in the last 168 hours. BNP (last 3 results) No results for input(s): PROBNP in the last 8760 hours. HbA1C: No results for input(s): HGBA1C in the last 72 hours. CBG: No results for input(s): GLUCAP in the last 168 hours. Lipid Profile: No results for  input(s): CHOL, HDL, LDLCALC, TRIG, CHOLHDL, LDLDIRECT in the last 72 hours. Thyroid Function Tests: No results for input(s): TSH, T4TOTAL, FREET4, T3FREE, THYROIDAB in the last 72 hours. Anemia Panel: No results for input(s): VITAMINB12, FOLATE, FERRITIN, TIBC, IRON, RETICCTPCT in the last 72 hours. Urine analysis:    Component Value Date/Time   COLORURINE YELLOW 04/20/2016 1741   APPEARANCEUR CLEAR 04/20/2016 1741   LABSPEC 1.019 04/20/2016 1741   PHURINE 6.0 04/20/2016 1741   GLUCOSEU NEGATIVE 04/20/2016 1741   HGBUR NEGATIVE 04/20/2016 1741   BILIRUBINUR NEGATIVE 04/20/2016 1741   KETONESUR >80* 04/20/2016 1741   PROTEINUR NEGATIVE 04/20/2016 1741   UROBILINOGEN 1.0 08/14/2015 1140   NITRITE NEGATIVE 04/20/2016 1741   LEUKOCYTESUR NEGATIVE 04/20/2016 1741   Sepsis Labs: @LABRCNTIP (procalcitonin:4,lacticidven:4)  ) Recent Results (from the past 240 hour(s))  Urine culture     Status: None   Collection Time: 04/20/16  5:41 PM  Result Value Ref Range Status   Specimen Description URINE, RANDOM  Final   Special Requests NONE  Final   Culture   Final    NO GROWTH 1 DAY Performed at [facility]    Report Status 04/22/2016 FINAL  Final  C difficile quick scan w PCR reflex     Status: Abnormal   Collection Time: 04/21/16  8:00 PM  Result Value Ref Range Status   C Diff antigen POSITIVE (A) NEGATIVE Final   C Diff toxin NEGATIVE NEGATIVE Final   C Diff interpretation   Final    C. difficile present, but toxin not detected. This indicates colonization. In most cases, this does not require treatment. If patient has signs and symptoms consistent with colitis, consider treatment. Requires ENTERIC precautions.  MRSA PCR Screening     Status: None   Collection Time: 04/22/16 10:32 AM  Result Value Ref Range Status   MRSA by PCR NEGATIVE NEGATIVE Final    Comment:        The GeneXpert MRSA Assay (FDA approved for NASAL specimens only), is one component of  a comprehensive MRSA colonization surveillance program. It is not intended to diagnose MRSA infection nor to guide or monitor treatment for MRSA infections.       Studies: No results found.  Scheduled Meds: . arformoterol  15 mcg Nebulization BID  . benztropine  0.5 mg Oral BID  . budesonide (PULMICORT) nebulizer solution  0.5 mg Nebulization BID  . cloNIDine  0.2 mg Oral TID  . DULoxetine  30 mg Oral Daily  . enoxaparin (LOVENOX) injection  40 mg Subcutaneous Q24H  . feeding supplement (ENSURE ENLIVE)  237 mL Oral BID BM  .  folic acid  1 mg Oral Daily  . gabapentin  300 mg Oral TID  . haloperidol  2 mg Oral TID  . LORazepam  2 mg Intravenous Q4H  . multivitamin with minerals  1 tablet Oral Daily  . nicotine  14 mg Transdermal Daily  . pantoprazole  40 mg Oral BID AC  . sodium chloride flush  3 mL Intravenous Q12H  . thiamine  100 mg Oral Daily   Or  . thiamine  100 mg Intravenous Daily    Continuous Infusions:     LOS: 7 days   Time spent: 15 minutes  Hollice EspyKRISHNAN,Devonn Giampietro K, MD [facility] Pager [phone]  If 7PM-7AM, please contact night-coverage [email or web address] Password [email or web address] 04/28/2016, 12:50 PM

## 2016-04-28 NOTE — Progress Notes (Signed)
Peripherally Inserted Central Catheter/Midline Placement  The IV Nurse has discussed with the patient and/or persons authorized to consent for the patient, the purpose of this procedure and the potential benefits and risks involved with this procedure.  The benefits include less needle sticks, lab draws from the catheter and patient may be discharged home with the catheter.  Risks include, but not limited to, infection, bleeding, blood clot (thrombus formation), and puncture of an artery; nerve damage and irregular heat beat.  Alternatives to this procedure were also discussed.  PICC/Midline Placement Documentation        Rhonda Mitchell, Rhonda Mitchell 04/28/2016, 4:36 PM

## 2016-04-29 MED ORDER — GUAIFENESIN-DM 100-10 MG/5ML PO SYRP
5.0000 mL | ORAL_SOLUTION | ORAL | Status: DC | PRN
  Administered 2016-04-29 – 2016-05-05 (×3): 5 mL via ORAL
  Filled 2016-04-29 (×3): qty 10

## 2016-04-29 MED ORDER — MENTHOL 3 MG MT LOZG
1.0000 | LOZENGE | OROMUCOSAL | Status: DC | PRN
  Filled 2016-04-29: qty 9

## 2016-04-29 MED ORDER — HALOPERIDOL LACTATE 5 MG/ML IJ SOLN
5.0000 mg | Freq: Four times a day (QID) | INTRAMUSCULAR | Status: DC | PRN
  Administered 2016-04-29 – 2016-04-30 (×2): 5 mg via INTRAVENOUS
  Filled 2016-04-29 (×2): qty 1

## 2016-04-29 NOTE — Progress Notes (Signed)
Nutrition Follow-up  DOCUMENTATION CODES:   Not applicable  INTERVENTION:  - Continue Ensure Enlive TID - Continue to encourage PO intakes of meals and supplements - RD will continue to monitor for needs prior to d/c  NUTRITION DIAGNOSIS:   Inadequate oral intake related to acute illness as evidenced by meal completion < 25%. -resolved  No new nutrition dx a this time.  GOAL:   Patient will meet greater than or equal to 90% of their needs -currently met  MONITOR:   PO intake, Supplement acceptance, Weight trends, Labs, I & O's  ASSESSMENT:   55 y.o. female with medical history significant of alcohol abuse, COPD and hep C who presented to the ED because of epigastric abdominal pain, nausea and shakes. Patient is noncompliant with medical advises, showed up to the emergency department 8 times in the past 6 months of 4 time she was admitted. She was recently in the hospital from March 1 till March 20 for alcohol detoxification which required drug-induced coma. Reported 2 days after discharge from the hospital she started to drink alcohol. Reports she drinks 2 pints per day, she just decided yesterday she is sick and tired of drinking and she wants to quit so she drank about 3 in the morning and came into the ED seeking detoxification.  5/10 Per chart review, pt with 4 lb weight gain from 04/20/16-04/25/16 and pt consumed 75-100% of meals the past two days. Pt sleeping at time of RD visit with sitter at bedside who reports pt consumed 100% of breakfast and that Ensure is going to be given once pt wakes up later this AM. Again unable to complete physical assessment as did not ant to arouse pt and she was completely covered in blankets.   Per Dr. Petra Kuba note this AM, pt is a possible d/c within the next 24 hours. Pt meeting needs at this time. Medications reviewed; daily multivitamin with minerals, 1 mg folic acid/day, 371 mg thiamine/day. Labs reviewed; Phos: 6 mg/dL, AST/ALT elevated and  trending up.    5/5 - No intakes documented since previous assessment.  - Pt noted to be agitated and confused since previous assessment and required restraints on 5/3. -  Precedex was started 5/3 and weaning began on 5/4 with drip now off.  - Spoke with RN who reports pt did not consume breakfast this AM related to current state.  - Unable to complete physical assessment this visit related to current state; will monitor for ability to complete this at next follow-up.  - No new weight since admission. - Pt not meeting needs and likely meets criteria for some degree of malnutrition but unable to confirm at this time.   5/2 - No intakes documented since admission and RN reports pt ate a small amount of breakfast but that she is unable to quantify amount that pt ate.  - Pt is currently sitting up in bed eating a hamburger for lunch.  - Pt unable to provide significant information from PTA so will obtain all information at follow-up. - Pt admitted for alcohol detox and notes indicate pt with withdrawal symptoms (shaking, tachycardia) on admission. - Unable to complete physical assessment at this time and will do so at follow-up.  - Per chart review, pt gained 10 lbs from 11/14/15-12/18/15 and subsequently lost 5 lbs (4% body weight) from 12/18/15-04/20/16 which is not significant for time frame.    Diet Order:  Diet regular Room service appropriate?: Yes; Fluid consistency:: Thin  Skin:  Reviewed,  no issues  Last BM:  5/9  Height:   Ht Readings from Last 1 Encounters:  04/20/16 5' (1.524 m)    Weight:   Wt Readings from Last 1 Encounters:  04/25/16 119 lb 0.8 oz (54 kg)    Ideal Body Weight:  45.45 kg (kg)  BMI:  Body mass index is 23.25 kg/(m^2).  Estimated Nutritional Needs:   Kcal:  1315-1580 (25-30 kcal/kg)  Protein:  55-65 grams  Fluid:  >/= 1.5 L/day  EDUCATION NEEDS:   No education needs identified at this time     Jarome Matin, RD, LDN Inpatient  Clinical Dietitian Pager # [phone] After hours/weekend pager # [phone]

## 2016-04-29 NOTE — Consult Note (Signed)
CSW consult for SNF. CSW met with pt who is refusing SNF. Per sitter at bedside, pt walked with RW today. Pt reports she has a RW at home and has a friend who will provide transport at d/c. No other CSW needs reported or identified. CSW signing off.   Wandra Feinstein, MSW, LCSW

## 2016-04-29 NOTE — Progress Notes (Signed)
PROGRESS NOTE  Sherrilee GillesMichelle Sroka UJW:[id number] DOB: [birth year] DOA: 04/20/2016 PCP: No primary care provider on file.  HPI/Recap of past 7324 hours: 55 year old female admitted on 5/1 for alcohol detox. Patient reportedly drinks several pints of alcohol daily. She became quite agitated requiring IV Ativan and eventual Precedex drip. [facility] and patient able to be weaned off of drip and hospitalist reassumed care on 5/5. On night of 5/7, patient came more agitated requiring Precedex drip to be restarted. Able to weaned off by early morning of 5/9.  She is currently off precedex drip and ativan drip.   This morning, patient is more awake, interactive.  Feeling better than on admission, reports pain all over the body.  As per the sitter at bedside, she has walked about half n hour this morning.   Assessment/Plan: Principal Problem:   Alcohol withdrawal (HCC) secondary to alcohol abuse causing acute encephalopathy:  Precedex drip was weaned off. Critical care signed off.  PT to see.  Patient herself encouraged to quit, does not want to live like this. Monitor for another 24 hours and plan for discharge after PT sees the patient.  Active Problems:   Tachycardia: From withdrawals   Chronic hepatitis C without hepatic coma (HCC)   Elevated LFTs   Abdominal pain, vomiting, and diarrhea: appear to have resolved.    Code Status: Full code   Family Communication: Left message for family   Disposition Plan: transfer to telemetry.  Potential discharge in next 24 hours   Consultants:  Critical care   Procedures:  None  Antimicrobials:  None   DVT prophylaxis:  Lovenox Objective: Filed Vitals:   04/29/16 0242 04/29/16 0400 04/29/16 0600 04/29/16 0820  BP: 123/53 117/62 129/66   Pulse: 67 76 66   Temp:    98 F (36.7 C)  TempSrc:    Oral  Resp: 40 25 19   Height:      Weight:      SpO2: 98% 95% 96%     Intake/Output Summary (Last 24 hours) at 04/29/16  0942 Last data filed at 04/29/16 0700  Gross per 24 hour  Intake    620 ml  Output   1525 ml  Net   -905 ml   Filed Weights   04/20/16 1815 04/25/16 0500  Weight: 52.6 kg (115 lb 15.4 oz) 54 kg (119 lb 0.8 oz)    Exam:   General:  Alert and oriented 3, no acute distress  Cardiovascular: Regular rate and rhythm   Respiratory: Clear to auscultation bilaterally   Abdomen: Soft, nontender, nondistended, positive bowel sounds   Musculoskeletal: No clubbing or cyanosis or edema   Skin: No skin breaks, tears or lesions   Data Reviewed: CBC:  Recent Labs Lab 04/23/16 0315 04/25/16 0320 04/26/16 0324 04/27/16 0421  WBC 6.3 7.1 6.9 5.1  NEUTROABS  --  4.6 4.0 2.5  HGB 9.9* 9.2* 8.3* 9.1*  HCT 31.9* 29.9* 26.9* 29.3*  MCV 92.5 94.9 93.4 93.6  PLT 125* 176 258 366   Basic Metabolic Panel:  Recent Labs Lab 04/23/16 0315 04/25/16 0320 04/26/16 0324 04/27/16 0421  NA 138 138 138 142  K 3.7 4.0 4.2 4.0  CL 101 103 102 105  CO2 24 27 26 25   GLUCOSE 83 116* 116* 111*  BUN 17 12 9 9   CREATININE 0.58 0.61 0.64 0.65  CALCIUM 9.0 8.4* 9.2 9.2  MG 1.6* 1.9 1.9 2.0  PHOS 3.7 4.8* 5.3* 6.0*  GFR: Estimated Creatinine Clearance: 57.7 mL/min (by C-G formula based on Cr of 0.65). Liver Function Tests:  Recent Labs Lab 04/23/16 0315 04/26/16 0324  AST 75* 105*  ALT 35 60*  ALKPHOS 59 63  BILITOT 1.0 0.5  PROT 7.1 7.2  ALBUMIN 3.7 3.5    Recent Labs Lab 04/23/16 0315  LIPASE 19   No results for input(s): AMMONIA in the last 168 hours. Coagulation Profile: No results for input(s): INR, PROTIME in the last 168 hours. Cardiac Enzymes: No results for input(s): CKTOTAL, CKMB, CKMBINDEX, TROPONINI in the last 168 hours. BNP (last 3 results) No results for input(s): PROBNP in the last 8760 hours. HbA1C: No results for input(s): HGBA1C in the last 72 hours. CBG: No results for input(s): GLUCAP in the last 168 hours. Lipid Profile: No results for input(s):  CHOL, HDL, LDLCALC, TRIG, CHOLHDL, LDLDIRECT in the last 72 hours. Thyroid Function Tests: No results for input(s): TSH, T4TOTAL, FREET4, T3FREE, THYROIDAB in the last 72 hours. Anemia Panel: No results for input(s): VITAMINB12, FOLATE, FERRITIN, TIBC, IRON, RETICCTPCT in the last 72 hours. Urine analysis:    Component Value Date/Time   COLORURINE YELLOW 04/20/2016 1741   APPEARANCEUR CLEAR 04/20/2016 1741   LABSPEC 1.019 04/20/2016 1741   PHURINE 6.0 04/20/2016 1741   GLUCOSEU NEGATIVE 04/20/2016 1741   HGBUR NEGATIVE 04/20/2016 1741   BILIRUBINUR NEGATIVE 04/20/2016 1741   KETONESUR >80* 04/20/2016 1741   PROTEINUR NEGATIVE 04/20/2016 1741   UROBILINOGEN 1.0 08/14/2015 1140   NITRITE NEGATIVE 04/20/2016 1741   LEUKOCYTESUR NEGATIVE 04/20/2016 1741   Sepsis Labs: (procalcitonin:4,lacticidven:4)  ) Recent Results (from the past 240 hour(s))  Urine culture     Status: None   Collection Time: 04/20/16  5:41 PM  Result Value Ref Range Status   Specimen Description URINE, RANDOM  Final   Special Requests NONE  Final   Culture   Final    NO GROWTH 1 DAY Performed at [facility]    Report Status 04/22/2016 FINAL  Final  C difficile quick scan w PCR reflex     Status: Abnormal   Collection Time: 04/21/16  8:00 PM  Result Value Ref Range Status   C Diff antigen POSITIVE (A) NEGATIVE Final   C Diff toxin NEGATIVE NEGATIVE Final   C Diff interpretation   Final    C. difficile present, but toxin not detected. This indicates colonization. In most cases, this does not require treatment. If patient has signs and symptoms consistent with colitis, consider treatment. Requires ENTERIC precautions.  MRSA PCR Screening     Status: None   Collection Time: 04/22/16 10:32 AM  Result Value Ref Range Status   MRSA by PCR NEGATIVE NEGATIVE Final    Comment:        The GeneXpert MRSA Assay (FDA approved for NASAL specimens only), is one component of a comprehensive MRSA  colonization surveillance program. It is not intended to diagnose MRSA infection nor to guide or monitor treatment for MRSA infections.       Studies: No results found.  Scheduled Meds: . arformoterol  15 mcg Nebulization BID  . benztropine  0.5 mg Oral BID  . budesonide (PULMICORT) nebulizer solution  0.5 mg Nebulization BID  . DULoxetine  30 mg Oral Daily  . enoxaparin (LOVENOX) injection  40 mg Subcutaneous Q24H  . feeding supplement (ENSURE ENLIVE)  237 mL Oral BID BM  . folic acid  1 mg Oral Daily  . gabapentin  300 mg Oral  TID  . haloperidol  2 mg Oral TID  . LORazepam  2 mg Intravenous Q4H  . multivitamin with minerals  1 tablet Oral Daily  . nicotine  14 mg Transdermal Daily  . pantoprazole  40 mg Oral BID AC  . sodium chloride flush  10-40 mL Intracatheter Q12H  . sodium chloride flush  3 mL Intravenous Q12H  . thiamine  100 mg Oral Daily   Or  . thiamine  100 mg Intravenous Daily    Continuous Infusions:     LOS: 8 days   Time spent: 15 minutes  Ishmail Mcmanamon, MD [facility] Pager [phone]   If 7PM-7AM, please contact night-coverage [email or web address] Password [facility] 04/29/2016, 9:42 AM

## 2016-04-30 NOTE — Progress Notes (Signed)
PT Cancellation Note  Patient Details Name: Rhonda Mitchell MRN: [id number] DOB: February 02, 1961   Cancelled Treatment:    Reason Eval/Treat Not Completed: Pain limiting ability to participate . Pt declined participation with PT   Rebeca AlertJannie Ryiah Bellissimo, MPT Pager: [phone]

## 2016-04-30 NOTE — Progress Notes (Signed)
PROGRESS NOTE  Rhonda Mitchell WUJ:[id number] DOB: Aug 30, 1961 DOA: 04/20/2016 PCP: No primary care provider on file.  HPI/Recap of past 7624 hours: 55 year old female admitted on 5/1 for alcohol detox. Patient reportedly drinks several pints of alcohol daily. She became quite agitated requiring IV Ativan and eventual Precedex drip. [facility] patient able to be weaned off of drip and hospitalist reassumed care on 5/5. On night of 5/7, patient came more agitated requiring Precedex drip to be restarted. Able to weaned off by early morning of 5/9.  She is currently off precedex drip and ativan drip.   This morning, patient is more awake, interactive.  Feeling better than on admission, reports pain all over the body.    Assessment/Plan: Principal Problem:   Alcohol withdrawal (HCC) secondary to alcohol abuse causing acute encephalopathy:  Precedex drip was weaned off. Critical care signed off.  Patient herself encouraged to quit, does not want to live like this. Monitor for another 24 hours and plan for discharge after PT sees the patient.  PT to see the patient today. But she refused to participate. She is still very drowsy and we discontinued the IV haldol an IV ativan. Encouraged her to work with PT.      Tachycardia: From withdrawals, resolved.     Chronic hepatitis C without hepatic coma [facility](HCC); outpatient follow up.     Elevated LFTs repeat liver function panel tomorrow.     Abdominal pain, vomiting, and diarrhea: appear to have resolved.   Anemia of chronic disease: Stable around 9.    Code Status: Full code   Family Communication: none at bedside.   Disposition Plan: d/c home in am. Pt refused SNF.    Consultants:  Critical care   Procedures:  None  Antimicrobials:  None   DVT prophylaxis:  Lovenox Objective: Filed Vitals:   04/29/16 1204 04/29/16 1259 04/29/16 1937 04/30/16 0609  BP: 113/53 127/61  125/57  Pulse:  69  77  Temp:   97.3 F (36.3 C)  99.9 F (37.7 C)  TempSrc:  Oral  Oral  Resp:  16  16  Height:      Weight:      SpO2:  100% 96% 94%    Intake/Output Summary (Last 24 hours) at 04/30/16 1355 Last data filed at 04/30/16 1330  Gross per 24 hour  Intake   1320 ml  Output      0 ml  Net   1320 ml   Filed Weights   04/20/16 1815 04/25/16 0500  Weight: 52.6 kg (115 lb 15.4 oz) 54 kg (119 lb 0.8 oz)    Exam:   General:  Alert and oriented 3, no acute distress  Cardiovascular: Regular rate and rhythm   Respiratory: Clear to auscultation bilaterally   Abdomen: Soft, nontender, nondistended, positive bowel sounds   Musculoskeletal: No clubbing or cyanosis or edema   Skin: No skin breaks, tears or lesions   Data Reviewed: CBC:  Recent Labs Lab 04/25/16 0320 04/26/16 0324 04/27/16 0421  WBC 7.1 6.9 5.1  NEUTROABS 4.6 4.0 2.5  HGB 9.2* 8.3* 9.1*  HCT 29.9* 26.9* 29.3*  MCV 94.9 93.4 93.6  PLT 176 258 366   Basic Metabolic Panel:  Recent Labs Lab 04/25/16 0320 04/26/16 0324 04/27/16 0421  NA 138 138 142  K 4.0 4.2 4.0  CL 103 102 105  CO2 27 26 25   GLUCOSE 116* 116* 111*  BUN 12 9 9   CREATININE 0.61 0.64  0.65  CALCIUM 8.4* 9.2 9.2  MG 1.9 1.9 2.0  PHOS 4.8* 5.3* 6.0*   GFR: Estimated Creatinine Clearance: 57.7 mL/min (by C-G formula based on Cr of 0.65). Liver Function Tests:  Recent Labs Lab 04/26/16 0324  AST 105*  ALT 60*  ALKPHOS 63  BILITOT 0.5  PROT 7.2  ALBUMIN 3.5   No results for input(s): LIPASE, AMYLASE in the last 168 hours. No results for input(s): AMMONIA in the last 168 hours. Coagulation Profile: No results for input(s): INR, PROTIME in the last 168 hours. Cardiac Enzymes: No results for input(s): CKTOTAL, CKMB, CKMBINDEX, TROPONINI in the last 168 hours. BNP (last 3 results) No results for input(s): PROBNP in the last 8760 hours. HbA1C: No results for input(s): HGBA1C in the last 72 hours. CBG: No results for input(s): GLUCAP in  the last 168 hours. Lipid Profile: No results for input(s): CHOL, HDL, LDLCALC, TRIG, CHOLHDL, LDLDIRECT in the last 72 hours. Thyroid Function Tests: No results for input(s): TSH, T4TOTAL, FREET4, T3FREE, THYROIDAB in the last 72 hours. Anemia Panel: No results for input(s): VITAMINB12, FOLATE, FERRITIN, TIBC, IRON, RETICCTPCT in the last 72 hours. Urine analysis:    Component Value Date/Time   COLORURINE YELLOW 04/20/2016 1741   APPEARANCEUR CLEAR 04/20/2016 1741   LABSPEC 1.019 04/20/2016 1741   PHURINE 6.0 04/20/2016 1741   GLUCOSEU NEGATIVE 04/20/2016 1741   HGBUR NEGATIVE 04/20/2016 1741   BILIRUBINUR NEGATIVE 04/20/2016 1741   KETONESUR >80* 04/20/2016 1741   PROTEINUR NEGATIVE 04/20/2016 1741   UROBILINOGEN 1.0 08/14/2015 1140   NITRITE NEGATIVE 04/20/2016 1741   LEUKOCYTESUR NEGATIVE 04/20/2016 1741   Sepsis Labs: (procalcitonin:4,lacticidven:4)  ) Recent Results (from the past 240 hour(s))  Urine culture     Status: None   Collection Time: 04/20/16  5:41 PM  Result Value Ref Range Status   Specimen Description URINE, RANDOM  Final   Special Requests NONE  Final   Culture   Final    NO GROWTH 1 DAY Performed at [facility]    Report Status 04/22/2016 FINAL  Final  C difficile quick scan w PCR reflex     Status: Abnormal   Collection Time: 04/21/16  8:00 PM  Result Value Ref Range Status   C Diff antigen POSITIVE (A) NEGATIVE Final   C Diff toxin NEGATIVE NEGATIVE Final   C Diff interpretation   Final    C. difficile present, but toxin not detected. This indicates colonization. In most cases, this does not require treatment. If patient has signs and symptoms consistent with colitis, consider treatment. Requires ENTERIC precautions.  MRSA PCR Screening     Status: None   Collection Time: 04/22/16 10:32 AM  Result Value Ref Range Status   MRSA by PCR NEGATIVE NEGATIVE Final    Comment:        The GeneXpert MRSA Assay (FDA approved for NASAL  specimens only), is one component of a comprehensive MRSA colonization surveillance program. It is not intended to diagnose MRSA infection nor to guide or monitor treatment for MRSA infections.       Studies: No results found.  Scheduled Meds: . arformoterol  15 mcg Nebulization BID  . benztropine  0.5 mg Oral BID  . budesonide (PULMICORT) nebulizer solution  0.5 mg Nebulization BID  . DULoxetine  30 mg Oral Daily  . enoxaparin (LOVENOX) injection  40 mg Subcutaneous Q24H  . feeding supplement (ENSURE ENLIVE)  237 mL Oral BID BM  . folic acid  1  mg Oral Daily  . gabapentin  300 mg Oral TID  . haloperidol  2 mg Oral TID  . LORazepam  2 mg Intravenous Q4H  . multivitamin with minerals  1 tablet Oral Daily  . nicotine  14 mg Transdermal Daily  . pantoprazole  40 mg Oral BID AC  . sodium chloride flush  10-40 mL Intracatheter Q12H  . sodium chloride flush  3 mL Intravenous Q12H  . thiamine  100 mg Oral Daily    Continuous Infusions:     LOS: 9 days   Time spent: 15 minutes  Bonita Brindisi, MD [facility] Pager [phone]   If 7PM-7AM, please contact night-coverage [email or web address] Password [email or web address] 04/30/2016, 1:55 PM

## 2016-05-01 MED ORDER — HALOPERIDOL LACTATE 5 MG/ML IJ SOLN
5.0000 mg | Freq: Four times a day (QID) | INTRAMUSCULAR | Status: DC | PRN
  Administered 2016-05-01 – 2016-05-10 (×17): 5 mg via INTRAVENOUS
  Filled 2016-05-01 (×19): qty 1

## 2016-05-01 MED ORDER — HALOPERIDOL LACTATE 5 MG/ML IJ SOLN: 5.0000 mg | Freq: Once | INTRAMUSCULAR | Status: DC

## 2016-05-01 NOTE — Progress Notes (Signed)
PT Cancellation Note  Patient Details Name: Rhonda Mitchell MRN: [id number] DOB: 07-31-1961   Cancelled Treatment:    Cancel per RN due to earlier event and pt's behavior.     Armando ReichertKropski, Pegge Cumberledge Ann 05/01/2016, 10:51 AM

## 2016-05-01 NOTE — Consult Note (Signed)
[facility] Face-to-Face Psychiatry Consult   Reason for Consult:  Alcohol intoxication and withdrawal and depression Referring Physician:  Dr. Blake Divine Patient Identification: Rhonda Mitchell MRN:  [id number] Principal Diagnosis: Alcohol withdrawal [facility]) Diagnosis:   Patient Active Problem List   Diagnosis Date Noted  . Acute encephalopathy [G93.40]   . Centrilobular emphysema (HCC) [J43.2]   . Encephalopathy acute [G93.40] 04/23/2016  . Alcohol dependence with alcohol-induced mood disorder (HCC) [F10.24] 03/05/2016  . Abnormality of gait [R26.9]   . Lower extremity weakness [R29.898]   . Pressure ulcer [L89.90] 02/26/2016  . Tachypnea [R06.82]   . Depression [F32.9]   . Hypernatremia [E87.0] 02/20/2016  . Anemia [D64.9] 02/20/2016  . UTI (lower urinary tract infection) [N39.0]   . Abdominal pain, vomiting, and diarrhea [R10.9, R11.10, R19.7] 12/18/2015  . Alcohol abuse [F10.10] 12/18/2015  . Abdominal pain [R10.9] 12/18/2015  . Elevated blood pressure [R03.0] 12/18/2015  . Severe recurrent major depression without psychotic features (HCC) [F33.2] 10/28/2015  . Alcohol dependence with uncomplicated withdrawal (HCC) [F10.230] 10/27/2015  . Alcohol-induced mood disorder (HCC) [F10.94] 10/27/2015  . Psychosis [F29] 08/21/2015  . Hypomagnesemia [E83.42]   . Protein-calorie malnutrition (HCC) [E46]   . Hepatitis C antibody test positive [R89.4]   . Elevated LFTs [R79.89]   . Hyperkalemia [E87.5]   . Severe protein-calorie malnutrition (HCC) [E43]   . Acute respiratory failure with hypoxia (HCC) [J96.01] 08/03/2015  . Septic olecranon bursitis of right elbow [M70.21, B96.89] 05/08/2015  . Septic bursitis of elbow [M71.129] 05/07/2015  . Thrombocytopenia (HCC) [D69.6] 05/07/2015  . Chronic hepatitis C without hepatic coma (HCC) [B18.2] 05/07/2015  . Hypokalemia [E87.6] 05/07/2015  . Abscess of bursa of elbow [M71.029] 05/07/2015  . COPD exacerbation (HCC) [J44.1] 03/18/2015  . Hyponatremia  [E87.1] 03/18/2015  . Hyperglycemia [R73.9] 03/18/2015  . Alcohol dependence (HCC) [F10.20] 12/07/2014  . Alcohol withdrawal (HCC) [F10.239] 12/07/2014  . Protein-calorie malnutrition, severe (HCC) [E43] 12/07/2014  . Musculoskeletal chest pain [R07.89] 12/07/2014  . Tobacco abuse [Z72.0] 12/07/2014  . Carbuncle and furuncle [L02.92, L02.93] 12/07/2014  . Macrocytic anemia [D53.9] 12/07/2014  . Chest pain [R07.9] 11/30/2014  . Tachycardia [R00.0] 11/30/2014  . COPD (chronic obstructive pulmonary disease) (HCC) [J44.9] 11/30/2014  . Cellulitis and abscess [L03.90, L02.91] 11/30/2014  . Atypical chest pain [R07.89]   . Dehydration [E86.0]     Total Time spent with patient: 45 minutes  Subjective:   Rhonda Mitchell is a 55 y.o. female patient admitted with abdominal pain, nausea and withdrawal shakes.  HPI:  Rhonda Mitchell is a 55 y.o. female, seen, chart reviewed for the face-to-face psychiatric consultation and evaluation increased agitation and alcohol withdrawal symptoms. Patient is a poor historian and seems to be confused and could not provide linear and goal-directed history even after 10 days being in hospital and seems like it medically stable. Patient is psychiatrically not stable to continue to be irritable, as stated and cursing and yelling at staff members. Review of medical records indicated that patient presented to the emergency department with the epigastric abdominal pain, nausea and withdrawal shakes. Patient is known to the psychiatric services from her multiple hospital encounter some admissions for alcohol withdrawal symptoms and detox treatment. Patient reportedly stopped drinking alcohol a few days ago and seems to be confused does not even know when she came to the hospital. Patient is noncompliant with medical advises, showed up to the emergency department 8 times in the past 6 months of 4 time she was admitted. She was recently in the hospital  from March 1 till March  20 for alcohol detoxification which required drug-induced coma.   Past Psychiatric History: She has multiple hospital admission for alcohol detox treatments.  Risk to Self: Is patient at risk for suicide?: No Risk to Others:   Prior Inpatient Therapy:   Prior Outpatient Therapy:    Past Medical History:  Past Medical History  Diagnosis Date  . COPD (chronic obstructive pulmonary disease) (HCC)   . Anxiety   . Shortness of breath   . Arthritis   . GERD (gastroesophageal reflux disease)   . Full dentures   . Insomnia   . Alcohol abuse   . Hepatitis C   . Alcohol abuse   . History of MRSA infection   . History of encephalopathy     Past Surgical History  Procedure Laterality Date  . Arm surgery    . Hand surgery  1990    both rt/lt carpal tunnels  . Shoulder surgery      right and left-age [age]  . Orif ulnar / radial shaft fracture  1990    right-got infection-had total 10 surgeies 1990  . Orif ulnar fracture Left 05/23/2014    Procedure: OPEN REDUCTION INTERNAL FIXATION (ORIF) LEFT ULNAR FRACTURE;  Surgeon: Harvie Junior, MD;  Location: [facility];  Service: Orthopedics;  Laterality: Left;  . I&d extremity Right 05/07/2015    Procedure: IRRIGATION AND DEBRIDEMENT  ELBOW ;  Surgeon: Dominica Severin, MD;  Location: [facility] OR;  Service: Orthopedics;  Laterality: Right;   Family History:  Family History  Problem Relation Age of Onset  . Breast cancer Mother    Family Psychiatric  History: Unknown Social History:  History  Alcohol Use  . Yes    Comment: 1/5th Vodka every day      History  Drug Use No    Comment: Hx: herion, cocaine 5 yrs ago    Social History   Social History  . Marital Status: Legally Separated    Spouse Name: N/A  . Number of Children: N/A  . Years of Education: N/A   Social History Main Topics  . Smoking status: Current Every Day Smoker -- 1.00 packs/day for 20 years    Types: Cigarettes  . Smokeless tobacco: Never Used  .  Alcohol Use: Yes     Comment: 1/5th Vodka every day   . Drug Use: No     Comment: Hx: herion, cocaine 5 yrs ago  . Sexual Activity: Yes    Birth Control/ Protection: Post-menopausal   Other Topics Concern  . None   Social History Narrative   Additional Social History:    Allergies:  No Known Allergies  Labs: No results found for this or any previous visit (from the past 48 hour(s)).  Current Facility-Administered Medications  Medication Dose Route Frequency Provider Last Rate Last Dose  . acetaminophen (TYLENOL) tablet 650 mg  650 mg Oral Q6H PRN [name], NP   650 mg at 04/30/16 2116  . arformoterol (BROVANA) nebulizer solution 15 mcg  15 mcg Nebulization BID [name], NP   15 mcg at 04/29/16 1937  . benztropine (COGENTIN) tablet 0.5 mg  0.5 mg Oral BID [name], NP   0.5 mg at 05/01/16 0929  . budesonide (PULMICORT) nebulizer solution 0.5 mg  0.5 mg Nebulization BID [name], NP   0.5 mg at 04/29/16 1937  . DULoxetine (CYMBALTA) DR capsule 30 mg  30 mg Oral Daily [name],  NP   30 mg at 05/01/16 0929  . enoxaparin (LOVENOX) injection 40 mg  40 mg Subcutaneous Q24H Maretta Bees, MD   40 mg at 04/30/16 1444  . feeding supplement (ENSURE ENLIVE) (ENSURE ENLIVE) liquid 237 mL  237 mL Oral BID BM Clydia Llano, MD   237 mL at 05/01/16 0928  . folic acid (FOLVITE) tablet 1 mg  1 mg Oral Daily Clydia Llano, MD   1 mg at 05/01/16 0929  . gabapentin (NEURONTIN) capsule 300 mg  300 mg Oral TID [name], NP   300 mg at 05/01/16 0929  . gi cocktail (Maalox,Lidocaine,Donnatal)  30 mL Oral TID PRN Clydia Llano, MD   30 mL at 04/21/16 1650  . guaiFENesin-dextromethorphan (ROBITUSSIN DM) 100-10 MG/5ML syrup 5 mL  5 mL Oral Q4H PRN Kathlen Mody, MD   5 mL at 04/30/16 1009  . haloperidol (HALDOL) tablet 2 mg  2 mg Oral TID [name], NP   2 mg at 05/01/16 0929  . LORazepam (ATIVAN) injection 1 mg  1 mg Intravenous Q4H PRN [name], NP   1 mg  at 04/29/16 1347  . LORazepam (ATIVAN) injection 2-3 mg  2-3 mg Intravenous Q1H PRN Maryann Mikhail, DO   2 mg at 05/01/16 4098  . menthol-cetylpyridinium (CEPACOL) lozenge 3 mg  1 lozenge Oral PRN Kathlen Mody, MD      . metoprolol (LOPRESSOR) injection 10 mg  10 mg Intravenous Q6H PRN Clydia Llano, MD      . multivitamin with minerals tablet 1 tablet  1 tablet Oral Daily Clydia Llano, MD   1 tablet at 05/01/16 0929  . nicotine (NICODERM CQ - dosed in mg/24 hours) patch 14 mg  14 mg Transdermal Daily Roma Kayser Schorr, NP   14 mg at 05/01/16 0930  . ondansetron (ZOFRAN) tablet 4 mg  4 mg Oral Q6H PRN Clydia Llano, MD       Or  . ondansetron (ZOFRAN) injection 4 mg  4 mg Intravenous Q6H PRN Clydia Llano, MD   4 mg at 04/21/16 1621  . oxyCODONE (Oxy IR/ROXICODONE) immediate release tablet 5 mg  5 mg Oral Q6H PRN [name], NP   5 mg at 05/01/16 0932  . pantoprazole (PROTONIX) EC tablet 40 mg  40 mg Oral BID AC Clydia Llano, MD   40 mg at 05/01/16 0928  . sodium chloride flush (NS) 0.9 % injection 10-40 mL  10-40 mL Intracatheter PRN Hollice Espy, MD   10 mL at 04/29/16 2122  . sodium chloride flush (NS) 0.9 % injection 10-40 mL  10-40 mL Intracatheter Q12H Hollice Espy, MD   10 mL at 05/01/16 0930  . sodium chloride flush (NS) 0.9 % injection 3 mL  3 mL Intravenous Q12H Clydia Llano, MD   3 mL at 04/29/16 2200  . thiamine (VITAMIN B-1) tablet 100 mg  100 mg Oral Daily Clydia Llano, MD   100 mg at 05/01/16 1191    Musculoskeletal: Strength & Muscle Tone: decreased Gait & Station: unable to stand Patient leans: N/A  Psychiatric Specialty Exam: ROS  No Fever-chills, No Headache, No changes with Vision or hearing, reports vertigo No problems swallowing food or Liquids, No Chest pain, Cough or Shortness of Breath, No Abdominal pain, No Nausea or Vommitting, Bowel movements are regular, No Blood in stool or Urine, No dysuria, No new skin rashes or bruises, No new joints  pains-aches,  No new weakness, tingling, numbness in  any extremity, No recent weight gain or loss, No polyuria, polydypsia or polyphagia,   A full 10 point Review of Systems was done, except as stated above, all other Review of Systems were negative.  Blood pressure 93/56, pulse 72, temperature 98.6 F (37 C), temperature source Oral, resp. rate 20, height 5' (1.524 m), weight 54 kg (119 lb 0.8 oz), SpO2 94 %.Body mass index is 23.25 kg/(m^2).  General Appearance: Guarded  Eye Contact::  Fair  Speech:  Slurred  Volume:  Decreased  Mood:  Angry, Depressed and Irritable  Affect:  Labile  Thought Process:  Coherent and Loose  Orientation:  Full (Time, Place, and Person)  Thought Content:  Rumination  Suicidal Thoughts:  No  Homicidal Thoughts:  No  Memory:  Immediate;   Fair Recent;   Fair  Judgement:  Impaired  Insight:  Fair  Psychomotor Activity:  Restlessness  Concentration:  Fair  Recall:  Poor  Fund of Knowledge:Fair  Language: Fair  Akathisia:  Negative  Handed:  Right  AIMS (if indicated):     Assets:  Communication Skills Desire for Improvement Financial Resources/Insurance Leisure Time Resilience Social Support Transportation  ADL's:  Impaired  Cognition: Impaired,  Mild  Sleep:      Treatment Plan Summary: Daily contact with patient to assess and evaluate symptoms and progress in treatment and Medication management   Patent attorneyContinue safety sitter as patient is not able to contract for safety.  Continue psychiatric medication Haldol 2 mg 2 times daily and Cogentin 0.5 mg 2 times daily for agitation and aggressive behavior  Continue Cymbalta 30 mg daily for depression and Neurontin 300 mg 3 times daily for mood swings  Refer to the unit social service regarding inpatient psychiatric referrals  Patient needed inpatient psychiatric hospitalization for psychiatric stability has patient does not stable at this time for discharge and outpatient medication  management.  Disposition: Recommend psychiatric Inpatient admission when medically cleared. Supportive therapy provided about ongoing stressors.  Nehemiah SettleJONNALAGADDA,JANARDHAHA R., MD 05/01/2016 10:34 AM

## 2016-05-01 NOTE — Progress Notes (Addendum)
Sitter for pt notified RN that pt had locked her self in the bathroom and would not let sitter in bathroom. When pt opened bathroom door pt was standing and walking out of bathroom. Pt stated that she fell in the bathroom and hit her body on the railing. The fall was unwitnessed. RN asked pt about the fall in the bathroom and the pt stated that "i'm not telling you jack shit." MD was notified of the pt falling and post fall assessment has been done. Rhonda Kabby W Sloane Palmer, RN   05/01/16 0845 05/01/16 0900 05/01/16 0906  What Happened  Was fall witnessed? No --  --   Was patient injured? Yes --  --   Patient found in bathroom (standing) --  --   Found by Staff-comment Careers adviser(RN and sitter) --  --   Stated prior activity ambulating-unassisted --  --   Follow Up  MD notified Akula --  --   Time MD notified [phone] --  --   Family notified No- patient refusal --  --   Additional tests No --  --   Progress note created (see row info) Yes --  --   Adult Fall Risk Assessment  Risk Factor Category (scoring not indicated) Not Applicable Fall has occurred during this admission (document High fall risk) --   Age 55 --  --   Fall History: Fall within 6 months prior to admission 0 --  --   Elimination; Bowel and/or Urine Incontinence 0 --  --   Elimination; Bowel and/or Urine Urgency/Frequency 0 --  --   Medications: includes PCA/Opiates, Anti-convulsants, Anti-hypertensives, Diuretics, Hypnotics, Laxatives, Sedatives, and Psychotropics 5 --  --   Patient Care Equipment 0 --  --   Mobility-Assistance 2 --  --   Mobility-Gait 2 --  --   Mobility-Sensory Deficit 0 --  --   Cognition-Awareness 0 --  --   Cognition-Impulsiveness 2 --  --   Cognition-Limitations 4 --  --   Total Score 15 --  --   Patient's Fall Risk High Fall Risk (>13 points) High Fall Risk (>13 points) --   Adult Fall Risk Interventions  Required Bundle Interventions *See Row Information* High fall risk - low, moderate, and high requirements  implemented High fall risk - low, moderate, and high requirements implemented --   Additional Interventions Fall risk signage;Use of appropriate toileting equipment (bedpan, BSC, etc.) Fall risk signage --   Fall with Injury Screening  Risk For Fall Injury- See Row Information  Nurse judgement --  --   Intervention(s) for 2 or more risk criteria identified Gait Belt --  --   Vitals  Temp --  --  98.6 F (37 C)  Temp Source --  --  Oral  BP --  --  (!) 93/56 mmHg  BP Location --  --  Left Arm  BP Method --  --  Automatic  Patient Position (if appropriate) --  --  Lying  Pulse Rate --  --  72  Pulse Rate Source --  --  Dinamap  Resp --  --  20  Oxygen Therapy  SpO2 --  --  94 %  O2 Device --  --  Room Air  Pain Assessment  Pain Assessment 0-10 --  --   Pain Score 0 --  --   PCA/Epidural/Spinal Assessment  Respiratory Pattern --  Regular;Unlabored;Symmetrical --   Neurological  Neuro (WDL) --  X --   Level of Consciousness --  Alert --  Orientation Level --  Oriented X4 --   Cognition --  Appropriate at baseline;Appropriate judgement;Follows commands --   Speech --  Clear --   R Pupil Size (mm) --  3 --   R Pupil Shape --  Round --   R Pupil Reaction --  Brisk --   L Pupil Size (mm) --  3 --   L Pupil Shape --  Round --   L Pupil Reaction --  Brisk --   Additional Pupil Assessments --  No --   Neuro Symptoms --  Anxiety;Agitation;Forgetful --   Neuro symptoms relieved by --  Anti-anxiety medication --   Neuro Additional Assessments --  No --   Neuro Additional Assessments --  No --   Glasgow Coma Scale  Eye Opening --  4 --   Best Verbal Response (NON-intubated) --  5 --   Best Motor Response --  6 --   Glasgow Coma Scale Score --  15 --   Musculoskeletal  Musculoskeletal (WDL) --  X --   Assistive Device --  Front wheel walker --   Generalized Weakness --  Yes --   Weight Bearing Restrictions --  No --   Integumentary  Integumentary (WDL) --  WDL --   Skin Color --   Appropriate for ethnicity --   Skin Condition --  Dry --   Skin Integrity --  Ecchymosis --   Ecchymosis Location --  Head --   Ecchymosis Location Orientation --  Bilateral;Mid --   Skin Turgor --  Non-tenting --   Pain Screening  Clinical Progression --  Not changed --   Pain Assessment  Work-Related Injury --  No --

## 2016-05-01 NOTE — Progress Notes (Signed)
PROGRESS NOTE  Rhonda Mitchell ZOX:[id number] DOB: 18-Jun-1961 DOA: 04/20/2016 PCP: No primary care provider on file.  HPI/Recap of past 37 hours: 55 year old female admitted on 5/1 for alcohol detox. Patient reportedly drinks several pints of alcohol daily. She became quite agitated requiring IV Ativan and eventual Precedex drip. She was transferred to [facility] service and patient able to be weaned off of drip and hospitalist reassumed care on 5/5. On night of 5/7, patient came more agitated requiring Precedex drip to be restarted. Able to weaned off by early morning of 5/9.  She is currently off precedex drip and ativan drip.   She was trasnferred to the floor on 5/10.    Assessment/Plan: Principal Problem:   Alcohol withdrawal (HCC) secondary to alcohol abuse causing acute encephalopathy:  Precedex drip was weaned off. Critical care signed off.  Patient herself encouraged to quit, does not want to live like this. Monitor for another 24 hours and plan for discharge after PT sees the patient.  PT to see the patient today. But she refused to participate.today she went in to the bathroom and didn't allow the sitter to enter, and she tried to elope on two different occasions. She refused to talk to me and has been verbally abrasive to rest of the staff. . Encouraged her to work with PT, but she refused on many occasions.. Requested psychiatry consult as she doesn t appear to have the insight in to her medical problems and she is behaving irrationally. She would benefit from inpatient psychiatry hospitalization. Tried to get in touch with the family but couldn't leave a message.       Tachycardia: this afternoon. Ordered a 12 lead EKG.     Chronic hepatitis C without hepatic coma [facility]); outpatient follow up.     Elevated LFTs repeat liver function panel tomorrow.     Abdominal pain, vomiting, and diarrhea: appear to have resolved.   Anemia of chronic disease: Stable around 9.    Code Status:  Full code   Family Communication: none at bedside.   Disposition Plan:  Awaiting inpatient psychiatry hospitalization.   Consultants:  Critical care   Procedures:  None  Antimicrobials:  None   DVT prophylaxis:  Lovenox Objective: Filed Vitals:   04/30/16 1500 05/01/16 0552 05/01/16 0906 05/01/16 1524  BP: 112/89 155/87 93/56 106/52  Pulse: 89 90 72 131  Temp: 97.6 F (36.4 C) 98.1 F (36.7 C) 98.6 F (37 C) 98.7 F (37.1 C)  TempSrc: Oral Axillary Oral Oral  Resp: 18 17 20 20   Height:      Weight:      SpO2: 96% 96% 94% 94%    Intake/Output Summary (Last 24 hours) at 05/01/16 1710 Last data filed at 05/01/16 1234  Gross per 24 hour  Intake   1020 ml  Output      0 ml  Net   1020 ml   Filed Weights   04/20/16 1815 04/25/16 0500  Weight: 52.6 kg (115 lb 15.4 oz) 54 kg (119 lb 0.8 oz)    Exam:   General:  Alert  Refused to talk to me, allowed me to examine her.   Cardiovascular: Regular rate and rhythm   Respiratory: Clear to auscultation bilaterally   Abdomen: Soft, nontender, nondistended, positive bowel sounds   Musculoskeletal: No clubbing or cyanosis or edema    Data Reviewed: CBC:  Recent Labs Lab 04/25/16 0320 04/26/16 0324 04/27/16 0421  WBC 7.1 6.9 5.1  NEUTROABS 4.6 4.0 2.5  HGB 9.2* 8.3* 9.1*  HCT 29.9* 26.9* 29.3*  MCV 94.9 93.4 93.6  PLT 176 258 366   Basic Metabolic Panel:  Recent Labs Lab 04/25/16 0320 04/26/16 0324 04/27/16 0421  NA 138 138 142  K 4.0 4.2 4.0  CL 103 102 105  CO2 27 26 25   GLUCOSE 116* 116* 111*  BUN 12 9 9   CREATININE 0.61 0.64 0.65  CALCIUM 8.4* 9.2 9.2  MG 1.9 1.9 2.0  PHOS 4.8* 5.3* 6.0*   GFR: Estimated Creatinine Clearance: 57.7 mL/min (by C-G formula based on Cr of 0.65). Liver Function Tests:  Recent Labs Lab 04/26/16 0324  AST 105*  ALT 60*  ALKPHOS 63  BILITOT 0.5  PROT 7.2  ALBUMIN 3.5   No results for input(s): LIPASE, AMYLASE in the last 168 hours. No results for  input(s): AMMONIA in the last 168 hours. Coagulation Profile: No results for input(s): INR, PROTIME in the last 168 hours. Cardiac Enzymes: No results for input(s): CKTOTAL, CKMB, CKMBINDEX, TROPONINI in the last 168 hours. BNP (last 3 results) No results for input(s): PROBNP in the last 8760 hours. HbA1C: No results for input(s): HGBA1C in the last 72 hours. CBG: No results for input(s): GLUCAP in the last 168 hours. Lipid Profile: No results for input(s): CHOL, HDL, LDLCALC, TRIG, CHOLHDL, LDLDIRECT in the last 72 hours. Thyroid Function Tests: No results for input(s): TSH, T4TOTAL, FREET4, T3FREE, THYROIDAB in the last 72 hours. Anemia Panel: No results for input(s): VITAMINB12, FOLATE, FERRITIN, TIBC, IRON, RETICCTPCT in the last 72 hours. Urine analysis:    Component Value Date/Time   COLORURINE YELLOW 04/20/2016 1741   APPEARANCEUR CLEAR 04/20/2016 1741   LABSPEC 1.019 04/20/2016 1741   PHURINE 6.0 04/20/2016 1741   GLUCOSEU NEGATIVE 04/20/2016 1741   HGBUR NEGATIVE 04/20/2016 1741   BILIRUBINUR NEGATIVE 04/20/2016 1741   KETONESUR >80* 04/20/2016 1741   PROTEINUR NEGATIVE 04/20/2016 1741   UROBILINOGEN 1.0 08/14/2015 1140   NITRITE NEGATIVE 04/20/2016 1741   LEUKOCYTESUR NEGATIVE 04/20/2016 1741   Sepsis Labs: @LABRCNTIP (procalcitonin:4,lacticidven:4)  ) Recent Results (from the past 240 hour(s))  C difficile quick scan w PCR reflex     Status: Abnormal   Collection Time: 04/21/16  8:00 PM  Result Value Ref Range Status   C Diff antigen POSITIVE (A) NEGATIVE Final   C Diff toxin NEGATIVE NEGATIVE Final   C Diff interpretation   Final    C. difficile present, but toxin not detected. This indicates colonization. In most cases, this does not require treatment. If patient has signs and symptoms consistent with colitis, consider treatment. Requires ENTERIC precautions.  MRSA PCR Screening     Status: None   Collection Time: 04/22/16 10:32 AM  Result Value Ref Range  Status   MRSA by PCR NEGATIVE NEGATIVE Final    Comment:        The GeneXpert MRSA Assay (FDA approved for NASAL specimens only), is one component of a comprehensive MRSA colonization surveillance program. It is not intended to diagnose MRSA infection nor to guide or monitor treatment for MRSA infections.       Studies: No results found.  Scheduled Meds: . arformoterol  15 mcg Nebulization BID  . benztropine  0.5 mg Oral BID  . budesonide (PULMICORT) nebulizer solution  0.5 mg Nebulization BID  . DULoxetine  30 mg Oral Daily  . enoxaparin (LOVENOX) injection  40 mg Subcutaneous Q24H  . feeding supplement (ENSURE ENLIVE)  237 mL Oral BID BM  . folic  acid  1 mg Oral Daily  . gabapentin  300 mg Oral TID  . haloperidol  2 mg Oral TID  . multivitamin with minerals  1 tablet Oral Daily  . nicotine  14 mg Transdermal Daily  . pantoprazole  40 mg Oral BID AC  . sodium chloride flush  10-40 mL Intracatheter Q12H  . sodium chloride flush  3 mL Intravenous Q12H  . thiamine  100 mg Oral Daily    Continuous Infusions:     LOS: 10 days   Time spent: 15 minutes  Hillis Mcphatter, MD [facility] Pager [phone]   If 7PM-7AM, please contact night-coverage [email or web address] Password [email or web address] 05/01/2016, 5:10 PM

## 2016-05-02 LAB — CBC
HEMATOCRIT: 27.6 % — AB (ref 36.0–46.0)
Hemoglobin: 8.6 g/dL — ABNORMAL LOW (ref 12.0–15.0)
MCH: 29.3 pg (ref 26.0–34.0)
MCHC: 31.2 g/dL (ref 30.0–36.0)
MCV: 93.9 fL (ref 78.0–100.0)
Platelets: 497 10*3/uL — ABNORMAL HIGH (ref 150–400)
RBC: 2.94 MIL/uL — ABNORMAL LOW (ref 3.87–5.11)
RDW: 19.2 % — AB (ref 11.5–15.5)
WBC: 13.9 10*3/uL — ABNORMAL HIGH (ref 4.0–10.5)

## 2016-05-02 LAB — COMPREHENSIVE METABOLIC PANEL
ALBUMIN: 3.3 g/dL — AB (ref 3.5–5.0)
ALT: 46 U/L (ref 14–54)
AST: 48 U/L — AB (ref 15–41)
Alkaline Phosphatase: 58 U/L (ref 38–126)
Anion gap: 11 (ref 5–15)
BUN: 11 mg/dL (ref 6–20)
CHLORIDE: 103 mmol/L (ref 101–111)
CO2: 27 mmol/L (ref 22–32)
Calcium: 8.9 mg/dL (ref 8.9–10.3)
Creatinine, Ser: 0.6 mg/dL (ref 0.44–1.00)
GFR calc Af Amer: >60 mL/min (ref >60)
GFR calc non Af Amer: >60 mL/min (ref >60)
GLUCOSE: 119 mg/dL — AB (ref 65–99)
POTASSIUM: 4.1 mmol/L (ref 3.5–5.1)
Sodium: 141 mmol/L (ref 135–145)
Total Bilirubin: 0.2 mg/dL — ABNORMAL LOW (ref 0.3–1.2)
Total Protein: 6.6 g/dL (ref 6.5–8.1)

## 2016-05-02 MED ORDER — LORAZEPAM 2 MG/ML IJ SOLN
2.0000 mg | INTRAMUSCULAR | Status: DC | PRN
  Administered 2016-05-02 – 2016-05-03 (×5): 2 mg via INTRAVENOUS
  Filled 2016-05-02 (×6): qty 1

## 2016-05-02 MED ORDER — LORAZEPAM 2 MG/ML IJ SOLN
2.0000 mg | Freq: Once | INTRAMUSCULAR | Status: AC
  Administered 2016-05-02: 2 mg via INTRAVENOUS
  Filled 2016-05-02: qty 1

## 2016-05-02 MED ORDER — NICOTINE 21 MG/24HR TD PT24
21.0000 mg | MEDICATED_PATCH | Freq: Every day | TRANSDERMAL | Status: DC
  Administered 2016-05-03 – 2016-05-05 (×3): 21 mg via TRANSDERMAL
  Filled 2016-05-02 (×3): qty 1

## 2016-05-02 NOTE — Progress Notes (Signed)
PROGRESS NOTE  Rhonda GillesMichelle Mitchell ZOX:[id number] DOB: Jul 07, 1961 DOA: 04/20/2016 PCP: No primary care provider on file.  HPI/Recap of past 8124 hours: 55 year old female admitted on 5/1 for alcohol detox. Patient reportedly drinks several pints of alcohol daily. She became quite agitated requiring IV Ativan and eventual Precedex drip. [facility] and patient able to be weaned off of drip and hospitalist reassumed care on 5/5. On night of 5/7, patient came more agitated requiring Precedex drip to be restarted. Able to weaned off by early morning of 5/9.  She is currently off precedex drip and ativan drip.   She was trasnferred to the floor on 5/10. Patient started getting agitated on 5/12 and she started pulling the tele wires and has fallen and tried to escape. Discussed with the friend at bedside and consulted psychiatry . She will need inpatient psychiatry hospitalization once her delirium improves.  Today she has been verbally abusive to the staff and tried leave the room, she received about 8 mg of IV ativan in the 12 hours and about 5 mg IV haldol and 4 mg po haldol.  She hit her head to the plastic cabinet when she was trying to get up.  She is not following the instructions and has been agitated.  Plan to transfer the patient to step down and requested PCCM to see if she needs to be re started on precedex drip overnight.    Assessment/Plan: Principal Problem:   Alcohol withdrawal (HCC) secondary to alcohol abuse causing acute encephalopathy:  Precedex drip was weaned off. Critical care signed off.  Patient herself encouraged to quit, does not want to live like this. Monitor for another 24 hours and plan for discharge after PT sees the patient.  PT to see the patient today. But she refused to participate.today she went in to the bathroom and didn't allow the sitter to enter, and she tried to elope on two different occasions. She refused to talk to me and has been verbally  abrasive to rest of the staff. . Encouraged her to work with PT, but she refused on many occasions.. Requested psychiatry consult as she doesn t appear to have the insight in to her medical problems and she is behaving irrationally. She would benefit from inpatient psychiatry hospitalization.  Continues to be agitated during theday, has hit her head to the plastic cabinet overhead, and has received 8 mg of IV ativan AND 5 MG iv HALDOL and 4 mg po haldol int he last 12 hours. Plan to transfer to step down.          Chronic hepatitis C without hepatic coma [facility](HCC); outpatient follow up.     Elevated LFTs repeat liver function panel show improvement .     Abdominal pain, vomiting, and diarrhea: appear to have resolved.   Anemia of chronic disease: Stable around 9.    Code Status: Full code   Family Communication: discussed with friend at bedside.   Disposition Plan:  Transfer to step down   Consultants:  Critical care   Procedures:  None  Antimicrobials:  None   DVT prophylaxis:  Lovenox Objective: Filed Vitals:   05/01/16 1726 05/01/16 2147 05/02/16 1430 05/02/16 1711  BP:  138/78 128/65 95/66  Pulse: 76 67 78 81  Temp:  97.6 F (36.4 C) 97.7 F (36.5 C) 98.5 F (36.9 C)  TempSrc:  Oral Oral Oral  Resp:  17 18 22   Height:      Weight:  SpO2:  99% 97% 99%    Intake/Output Summary (Last 24 hours) at 05/02/16 1830 Last data filed at 05/02/16 1510  Gross per 24 hour  Intake    540 ml  Output      0 ml  Net    540 ml   Filed Weights   04/20/16 1815 04/25/16 0500  Weight: 52.6 kg (115 lb 15.4 oz) 54 kg (119 lb 0.8 oz)    Exam:   General:  Alert  Confused. Agitated.   Cardiovascular: Regular rate and rhythm   Respiratory: Clear to auscultation bilaterally   Abdomen: Soft, nontender, nondistended, positive bowel sounds   Musculoskeletal: No clubbing or cyanosis or edema    Data Reviewed: CBC:  Recent Labs Lab 04/26/16 0324 04/27/16 0421  05/02/16 0500  WBC 6.9 5.1 13.9*  NEUTROABS 4.0 2.5  --   HGB 8.3* 9.1* 8.6*  HCT 26.9* 29.3* 27.6*  MCV 93.4 93.6 93.9  PLT 258 366 497*   Basic Metabolic Panel:  Recent Labs Lab 04/26/16 0324 04/27/16 0421 05/02/16 0500  NA 138 142 141  K 4.2 4.0 4.1  CL 102 105 103  CO2 26 25 27   GLUCOSE 116* 111* 119*  BUN 9 9 11   CREATININE 0.64 0.65 0.60  CALCIUM 9.2 9.2 8.9  MG 1.9 2.0  --   PHOS 5.3* 6.0*  --    GFR: Estimated Creatinine Clearance: 57.7 mL/min (by C-G formula based on Cr of 0.6). Liver Function Tests:  Recent Labs Lab 04/26/16 0324 05/02/16 0500  AST 105* 48*  ALT 60* 46  ALKPHOS 63 58  BILITOT 0.5 0.2*  PROT 7.2 6.6  ALBUMIN 3.5 3.3*   No results for input(s): LIPASE, AMYLASE in the last 168 hours. No results for input(s): AMMONIA in the last 168 hours. Coagulation Profile: No results for input(s): INR, PROTIME in the last 168 hours. Cardiac Enzymes: No results for input(s): CKTOTAL, CKMB, CKMBINDEX, TROPONINI in the last 168 hours. BNP (last 3 results) No results for input(s): PROBNP in the last 8760 hours. HbA1C: No results for input(s): HGBA1C in the last 72 hours. CBG: No results for input(s): GLUCAP in the last 168 hours. Lipid Profile: No results for input(s): CHOL, HDL, LDLCALC, TRIG, CHOLHDL, LDLDIRECT in the last 72 hours. Thyroid Function Tests: No results for input(s): TSH, T4TOTAL, FREET4, T3FREE, THYROIDAB in the last 72 hours. Anemia Panel: No results for input(s): VITAMINB12, FOLATE, FERRITIN, TIBC, IRON, RETICCTPCT in the last 72 hours. Urine analysis:    Component Value Date/Time   COLORURINE YELLOW 04/20/2016 1741   APPEARANCEUR CLEAR 04/20/2016 1741   LABSPEC 1.019 04/20/2016 1741   PHURINE 6.0 04/20/2016 1741   GLUCOSEU NEGATIVE 04/20/2016 1741   HGBUR NEGATIVE 04/20/2016 1741   BILIRUBINUR NEGATIVE 04/20/2016 1741   KETONESUR >80* 04/20/2016 1741   PROTEINUR NEGATIVE 04/20/2016 1741   UROBILINOGEN 1.0 08/14/2015  1140   NITRITE NEGATIVE 04/20/2016 1741   LEUKOCYTESUR NEGATIVE 04/20/2016 1741   Sepsis Labs: @LABRCNTIP (procalcitonin:4,lacticidven:4)  ) No results found for this or any previous visit (from the past 240 hour(s)).    Studies: No results found.  Scheduled Meds: . arformoterol  15 mcg Nebulization BID  . benztropine  0.5 mg Oral BID  . budesonide (PULMICORT) nebulizer solution  0.5 mg Nebulization BID  . DULoxetine  30 mg Oral Daily  . enoxaparin (LOVENOX) injection  40 mg Subcutaneous Q24H  . feeding supplement (ENSURE ENLIVE)  237 mL Oral BID BM  . folic acid  1 mg  Oral Daily  . gabapentin  300 mg Oral TID  . haloperidol  2 mg Oral TID  . multivitamin with minerals  1 tablet Oral Daily  . nicotine  14 mg Transdermal Daily  . pantoprazole  40 mg Oral BID AC  . sodium chloride flush  10-40 mL Intracatheter Q12H  . sodium chloride flush  3 mL Intravenous Q12H  . thiamine  100 mg Oral Daily    Continuous Infusions:     LOS: 11 days   Time spent: 45 minutes  [name] Shambaugh, MD [facility] Pager [phone]   If 7PM-7AM, please contact night-coverage [email or web address] Password [email or web address] 05/02/2016, 6:30 PM

## 2016-05-02 NOTE — Progress Notes (Addendum)
MD made aware of patient's increased agitation this shift. Patient refuses help from staff and continues to walk in hallway, yelling at other staff and other patient's family members. Staff has physically caught patient from falling multiple times- patient then yells at staff to "get the hell off of me!" NT was with pt in her room- patient began ripping all bedding off of bed. Patient was yelling at NT and cussing at staff. Per NT, patient then tripped over her bedding in the floor and fell. Per NT, patient hit the side of her head on the outside of the sharps box. Patient denies pain. No signs of injury. VSS- 98.5, 81, 22, 95/66, 99% RA. MD made aware. Primary contact Fayrene FearingJames also made aware of fall and probable transfer. Orders to transfer patient to step-down since all measures of PRN Ativan and Haldol have failed (8mg  IV Ativan, 4mg  PO Haldol and 5mg  IV Haldol administered this shift).

## 2016-05-02 NOTE — Progress Notes (Signed)
Patient transferred to ICU room 1237- patient ambulated with RN and NT assisting. Pt refused to sit down long enough to be transferred via bed or W/C. Patient did hit her forehead on the counter in her room while bending down to pick up her pants- reddened area to mid and upper forehead. Pt also scraped her right forearm on the wall in her bathroom while stumbling around, changing clothes. Patient refused help from all staff members.

## 2016-05-02 NOTE — Progress Notes (Signed)
Pt continues to attempt to ambulate independently. When staff attempts to help pt becomes physically and verbally abusive. When attempting to explain to the patient that she is a high fall risk because of a fall this admission, she still refuses help.

## 2016-05-02 NOTE — Progress Notes (Signed)
Per IV Team RN, this RN is allowed to access RUE PICC line and [facility] in order to administer PRN Ativan. PICC flushed and accessed with no complications. PRN Ativan given as needed.
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
## 2016-05-03 MED ORDER — LORAZEPAM 2 MG/ML IJ SOLN
2.0000 mg | INTRAMUSCULAR | Status: DC | PRN
  Administered 2016-05-03 – 2016-05-08 (×30): 2 mg via INTRAVENOUS
  Filled 2016-05-03 (×30): qty 1

## 2016-05-03 MED ORDER — HALOPERIDOL LACTATE 5 MG/ML IJ SOLN
5.0000 mg | Freq: Once | INTRAMUSCULAR | Status: AC
  Administered 2016-05-04: 5 mg via INTRAVENOUS
  Filled 2016-05-03: qty 1

## 2016-05-03 NOTE — Progress Notes (Signed)
Patient consistently refuses to wear cardiac monitor. MD aware. Will d/c order.
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
## 2016-05-03 NOTE — Clinical Social Work Note (Signed)
[facility], [facility] will review patient and if she is medically clear they may be able to take patient on Monday.  This CSW to update weekday social worker.  Ervin KnackEric R. Villa Burgin, MSW, Theresia MajorsLCSWA [phone] 05/03/2016 4:38 PM

## 2016-05-03 NOTE — Progress Notes (Signed)
PROGRESS NOTE  Rhonda Mitchell ZOX:[id number] DOB: 1961/11/16 DOA: 04/20/2016 PCP: No primary care provider on file.  HPI/Recap of past 65 hours: 55 year old female admitted on 5/1 for alcohol detox. Patient reportedly drinks several pints of alcohol daily. She became quite agitated requiring IV Ativan and eventual Precedex drip. [facility] and patient able to be weaned off of drip and hospitalist reassumed care on 5/5. On night of 5/7, patient came more agitated requiring Precedex drip to be restarted. Able to weaned off by early morning of 5/9.  She is currently off precedex drip and ativan drip.   She was trasnferred to the floor on 5/10. Patient started getting agitated on 5/12 and she started pulling the tele wires and has fallen and tried to escape. Discussed with the friend at bedside and consulted psychiatry . She will need inpatient psychiatry hospitalization once her delirium improves.  On 5/13  she has been verbally abusive to the staff and tried leave the room, she received about 8 mg of IV ativan in the 12 hours and about 5 mg IV haldol and 4 mg po haldol.  She hit her head to the plastic cabinet when she was trying to get up.  She is not following the instructions and has been agitated.  She was transferred to step down and requested PCCM to see if she needs to be re started on precedex drip overnight.    Assessment/Plan: Principal Problem:   Alcohol withdrawal (HCC) secondary to alcohol abuse causing acute encephalopathy:  Precedex drip was weaned off. Critical care signed off.  Patient herself encouraged to quit, does not want to live like this. Monitor for another 24 hours and plan for discharge after PT sees the patient.  PT to see the patient today. But she refused to participate.today she went in to the bathroom and didn't allow the sitter to enter, and she tried to elope on two different occasions. She refused to talk to me and has been verbally  abrasive to rest of the staff. . Encouraged her to work with PT, but she refused on many occasions.. Requested psychiatry consult as she doesn t appear to have the insight in to her medical problems and she is behaving irrationally. She would benefit from inpatient psychiatry hospitalization.  ContinueD to be agitated 5/13, has hit her head to the plastic cabinet overhead, and has received 8 mg of IV ativan AND 5 MG iv HALDOL and 4 mg po haldol int he last 12 hours. She was  transferred to step down.    No more episodes of agitation or maniac episodes soo far.       Chronic hepatitis C without hepatic coma [facility]); outpatient follow up.     Elevated LFTs repeat liver function panel show improvement .     Abdominal pain, vomiting, and diarrhea: appear to have resolved.   Anemia of chronic disease: Stable around 9.    Code Status: Full code   Family Communication: NO FAMILY AT BEDSIDE.  Disposition Plan:  Awaiting bed at [facility].    Consultants:  Critical care   Procedures:  None  Antimicrobials:  None   DVT prophylaxis:  Lovenox Objective: Filed Vitals:   05/03/16 1200 05/03/16 1225 05/03/16 1240 05/03/16 1510  BP:    112/65  Pulse: 73  73   Temp:  98.1 F (36.7 C)    TempSrc:  Oral    Resp:      Height:  Weight:      SpO2: 90%  90%     Intake/Output Summary (Last 24 hours) at 05/03/16 1534 Last data filed at 05/03/16 1500  Gross per 24 hour  Intake    600 ml  Output      0 ml  Net    600 ml   Filed Weights   04/20/16 1815 04/25/16 0500  Weight: 52.6 kg (115 lb 15.4 oz) 54 kg (119 lb 0.8 oz)    Exam:   General:  Sleepy but appears oriented.   Cardiovascular: Regular rate and rhythm   Respiratory: Clear to auscultation bilaterally   Abdomen: Soft, nontender, nondistended, positive bowel sounds   Musculoskeletal: No clubbing or cyanosis or edema    Data Reviewed: CBC:  Recent Labs Lab 04/27/16 0421 05/02/16 0500  WBC 5.1 13.9*  NEUTROABS  2.5  --   HGB 9.1* 8.6*  HCT 29.3* 27.6*  MCV 93.6 93.9  PLT 366 497*   Basic Metabolic Panel:  Recent Labs Lab 04/27/16 0421 05/02/16 0500  NA 142 141  K 4.0 4.1  CL 105 103  CO2 25 27  GLUCOSE 111* 119*  BUN 9 11  CREATININE 0.65 0.60  CALCIUM 9.2 8.9  MG 2.0  --   PHOS 6.0*  --    GFR: Estimated Creatinine Clearance: 57.7 mL/min (by C-G formula based on Cr of 0.6). Liver Function Tests:  Recent Labs Lab 05/02/16 0500  AST 48*  ALT 46  ALKPHOS 58  BILITOT 0.2*  PROT 6.6  ALBUMIN 3.3*   No results for input(s): LIPASE, AMYLASE in the last 168 hours. No results for input(s): AMMONIA in the last 168 hours. Coagulation Profile: No results for input(s): INR, PROTIME in the last 168 hours. Cardiac Enzymes: No results for input(s): CKTOTAL, CKMB, CKMBINDEX, TROPONINI in the last 168 hours. BNP (last 3 results) No results for input(s): PROBNP in the last 8760 hours. HbA1C: No results for input(s): HGBA1C in the last 72 hours. CBG: No results for input(s): GLUCAP in the last 168 hours. Lipid Profile: No results for input(s): CHOL, HDL, LDLCALC, TRIG, CHOLHDL, LDLDIRECT in the last 72 hours. Thyroid Function Tests: No results for input(s): TSH, T4TOTAL, FREET4, T3FREE, THYROIDAB in the last 72 hours. Anemia Panel: No results for input(s): VITAMINB12, FOLATE, FERRITIN, TIBC, IRON, RETICCTPCT in the last 72 hours. Urine analysis:    Component Value Date/Time   COLORURINE YELLOW 04/20/2016 1741   APPEARANCEUR CLEAR 04/20/2016 1741   LABSPEC 1.019 04/20/2016 1741   PHURINE 6.0 04/20/2016 1741   GLUCOSEU NEGATIVE 04/20/2016 1741   HGBUR NEGATIVE 04/20/2016 1741   BILIRUBINUR NEGATIVE 04/20/2016 1741   KETONESUR >80* 04/20/2016 1741   PROTEINUR NEGATIVE 04/20/2016 1741   UROBILINOGEN 1.0 08/14/2015 1140   NITRITE NEGATIVE 04/20/2016 1741   LEUKOCYTESUR NEGATIVE 04/20/2016 1741   Sepsis Labs: @LABRCNTIP (procalcitonin:4,lacticidven:4)  ) No results found for  this or any previous visit (from the past 240 hour(s)).    Studies: No results found.  Scheduled Meds: . arformoterol  15 mcg Nebulization BID  . benztropine  0.5 mg Oral BID  . budesonide (PULMICORT) nebulizer solution  0.5 mg Nebulization BID  . DULoxetine  30 mg Oral Daily  . enoxaparin (LOVENOX) injection  40 mg Subcutaneous Q24H  . feeding supplement (ENSURE ENLIVE)  237 mL Oral BID BM  . folic acid  1 mg Oral Daily  . gabapentin  300 mg Oral TID  . haloperidol  2 mg Oral TID  . multivitamin with  minerals  1 tablet Oral Daily  . nicotine  21 mg Transdermal Daily  . pantoprazole  40 mg Oral BID AC  . sodium chloride flush  10-40 mL Intracatheter Q12H  . sodium chloride flush  3 mL Intravenous Q12H  . thiamine  100 mg Oral Daily    Continuous Infusions:     LOS: 12 days   Time spent: 25 minutes  Kaidence Callaway, MD [facility] Pager [phone]   If 7PM-7AM, please contact night-coverage [email or web address] Password [facility] 05/03/2016, 3:34 PM
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
## 2016-05-04 ENCOUNTER — Inpatient Hospital Stay (HOSPITAL_COMMUNITY): Payer: Medicaid Other

## 2016-05-04 DIAGNOSIS — F10239 Alcohol dependence with withdrawal, unspecified: Secondary | ICD-10-CM

## 2016-05-04 DIAGNOSIS — F1994 Other psychoactive substance use, unspecified with psychoactive substance-induced mood disorder: Secondary | ICD-10-CM

## 2016-05-04 LAB — CBC
HCT: 30.1 % — ABNORMAL LOW (ref 36.0–46.0)
HEMOGLOBIN: 9.2 g/dL — AB (ref 12.0–15.0)
MCH: 28.5 pg (ref 26.0–34.0)
MCHC: 30.6 g/dL (ref 30.0–36.0)
MCV: 93.2 fL (ref 78.0–100.0)
Platelets: 620 10*3/uL — ABNORMAL HIGH (ref 150–400)
RBC: 3.23 MIL/uL — AB (ref 3.87–5.11)
RDW: 18.5 % — AB (ref 11.5–15.5)
WBC: 9.2 10*3/uL (ref 4.0–10.5)
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
## 2016-05-04 NOTE — Progress Notes (Signed)
O2 SATS low 86 - 92% numerous attempts to get pt to wear O2 without sucess

## 2016-05-04 NOTE — Progress Notes (Signed)
PT Cancellation Note  Patient Details Name: Rhonda Mitchell MRN: [id number] DOB: 1961-03-15   Cancelled Treatment:    Reason Eval/Treat Not Completed: Other (comment) (agitation over nght. will check back at another time.)   Rada HayHill, Laketta Soderberg Elizabeth 05/04/2016, 7:21 AM Blanchard KelchKaren March Steyer PT [phone]

## 2016-05-04 NOTE — Progress Notes (Addendum)
LCSW completed IVC paperwork this morning at 10:20am.  [facility] made aware of [facility]. Original Papers placed on patient's chart and awaiting for custody order to be served.Marland Kitchen. LCSW to follow up.   Referrals also sent to:  [facility] and patient under review/review clinicals [facility] : denied due to behaviors and acuity [facility]: denied, patient out of network with insurance and no beds [facility]: denied today due to no beds, can resend tomorrow if beds are available. [facility]: declined due to medical acuity and behaviors [facility]    Awaiting reviews, understand patient is medically stable will continue to follow.  Vivi BarrackNicole Sinclair, Theresia MajorsLCSWA, MSW Clinical Social Worker 5E and Psychiatric Service Line [phone] 05/04/2016  12:00 PM
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
## 2016-05-04 NOTE — Progress Notes (Signed)
Nutrition Follow-up  DOCUMENTATION CODES:   Not applicable  INTERVENTION:  - Continue Ensure Enlive ordered - RD will monitor for needs prior to d/c  NUTRITION DIAGNOSIS:   Inadequate oral intake related to acute illness as evidenced by meal completion < 25%. -resolved  No new nutrition dx at this time.  GOAL:   Patient will meet greater than or equal to 90% of their needs -met on average  MONITOR:   PO intake, Supplement acceptance, Weight trends, Labs, I & O's  ASSESSMENT:   55 y.o. female with medical history significant of alcohol abuse, COPD and hep C who presented to the ED because of epigastric abdominal pain, nausea and shakes. Patient is noncompliant with medical advises, showed up to the emergency department 8 times in the past 6 months of 4 time she was admitted. She was recently in the hospital from March 1 till March 20 for alcohol detoxification which required drug-induced coma. Reported 2 days after discharge from the hospital she started to drink alcohol. Reports she drinks 2 pints per day, she just decided yesterday she is sick and tired of drinking and she wants to quit so she drank about 3 in the morning and came into the ED seeking detoxification.  Per chart review, pt consumed 50% of lunch yesterday, 100% of breakfast this AM, and RN at bedside reports pt had 100% of sandwich and Ensure Enlive this afternoon. RN requests that pt not be aroused at this time; continue to be unable to complete physical assessment. No new weight since 04/25/16.   Pt to d/c to inpatient psych facility per notes. Meeting needs on average with meals and supplements. Medications reviewed. Labs reviewed; AST elevated but trending down.    Diet Order:  Diet regular Room service appropriate?: Yes; Fluid consistency:: Thin  Skin:  Reviewed, no issues  Last BM:  5/11  Height:   Ht Readings from Last 1 Encounters:  04/20/16 5' (1.524 m)    Weight:   Wt Readings from Last 1  Encounters:  04/25/16 119 lb 0.8 oz (54 kg)    Ideal Body Weight:  45.45 kg (kg)  BMI:  Body mass index is 23.25 kg/(m^2).  Estimated Nutritional Needs:   Kcal:  1315-1580 (25-30 kcal/kg)  Protein:  55-65 grams  Fluid:  >/= 1.5 L/day  EDUCATION NEEDS:   No education needs identified at this time     Jarome Matin, RD, LDN Inpatient Clinical Dietitian Pager # [phone] After hours/weekend pager # [phone]
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
## 2016-05-05 NOTE — Progress Notes (Signed)
Date:  May 05, 2016 Chart reviewed for concurrent status and case management needs. Will continue to follow patient for changes and needs: Expected discharge date: [id number] Reviewed at los meeting on [id number]. Marcelle Smilinghonda Davis, BSN, [place]3, [place]CCM   [phone]

## 2016-05-05 NOTE — Progress Notes (Signed)
PROGRESS NOTE  Rhonda Mitchell ZOX:[id number] DOB: 1961/03/07 DOA: 04/20/2016 PCP: No primary care provider on file.  HPI/Recap of past 47 hours: 55 year old female admitted on 5/1 for alcohol detox. Patient reportedly drinks several pints of alcohol daily. She became quite agitated requiring IV Ativan and eventual Precedex drip. She was transferred to [facility] service and patient able to be weaned off of drip and hospitalist reassumed care on 5/5. On night of 5/7, patient came more agitated requiring Precedex drip to be restarted. Able to weaned off by early morning of 5/9.  She is currently off precedex drip and ativan drip.   She was trasnferred to the floor on 5/10. Patient started getting agitated on 5/12 and she started pulling the tele wires and has fallen and tried to escape. Discussed with the friend at bedside and consulted psychiatry . She will need inpatient psychiatry hospitalization once her delirium improves.  On 5/13  she has been verbally abusive to the staff and tried leave the room, she received about 8 mg of IV ativan in the 12 hours and about 5 mg IV haldol and 4 mg po haldol.  She hit her head to the plastic cabinet when she was trying to get up.  She is not following the instructions and has been agitated.  She was transferred to step down and requested PCCM to see if she needs to be re started on precedex drip overnight.  5/14 she continues to be aggressive, and psychiatry following.  5/15 no new complaints.    Assessment/Plan: Principal Problem:   Alcohol withdrawal (HCC) secondary to alcohol abuse causing acute encephalopathy:  Precedex drip was weaned off. Critical care signed off.  . Monitor for another 24 hours in step down. She refused to talk to me and has been verbally abrasive to rest of the staff. . Encouraged her to work with PT, but she refused on many occasions.. Requested psychiatry consult as she doesn t appear to have the insight in to her medical problems and  she is behaving irrationally. She would benefit from inpatient psychiatry hospitalization.  ContinueD to be agitated 5/13, has hit her head to the plastic cabinet overhead, and has received 8 mg of IV ativan AND 5 MG iv HALDOL and 4 mg po haldol int he last 12 hours. She was  transferred to step down.    She continues to be verbally and physically aggressive, though allowed me to examine her initially became combative later on.      Chronic hepatitis C without hepatic coma [facility]); outpatient follow up.     Elevated LFTs repeat liver function panel show improvement .     Abdominal pain, vomiting, and diarrhea: appear to have resolved.   Anemia of chronic disease: Stable around 9.   Hypoxia requiring [place] oxygen, possibly from the sedating medication ativan she has been receiving the last 2 to 3 days.   repeat CXR does not show any active disease.    Code Status: Full code   Family Communication: NO FAMILY AT BEDSIDE.  Disposition Plan:  Awaiting bed at [facility].    Consultants:  Critical care   Psychiatry.   Procedures:  None  Antimicrobials:  None   DVT prophylaxis:  Lovenox Objective: Filed Vitals:   05/05/16 0400 05/05/16 0500 05/05/16 0600 05/05/16 0727  BP:    111/68  Pulse:      Temp:      TempSrc:      Resp: 22 19 22 20   Height:  Weight:      SpO2:        Intake/Output Summary (Last 24 hours) at 05/05/16 1143 Last data filed at 05/04/16 2200  Gross per 24 hour  Intake   1560 ml  Output    400 ml  Net   1160 ml   Filed Weights   04/20/16 1815 04/25/16 0500  Weight: 52.6 kg (115 lb 15.4 oz) 54 kg (119 lb 0.8 oz)    Exam:   General:  Sleepy. Comfortable.   Cardiovascular: Regular rate and rhythm   Respiratory: Clear to auscultation bilaterally   Abdomen: Soft, nontender, nondistended, positive bowel sounds   Musculoskeletal: No clubbing or cyanosis or edema    Data Reviewed: CBC:  Recent Labs Lab 05/02/16 0500 05/04/16 1330  WBC  13.9* 9.2  HGB 8.6* 9.2*  HCT 27.6* 30.1*  MCV 93.9 93.2  PLT 497* 620*   Basic Metabolic Panel:  Recent Labs Lab 05/02/16 0500  NA 141  K 4.1  CL 103  CO2 27  GLUCOSE 119*  BUN 11  CREATININE 0.60  CALCIUM 8.9   GFR: Estimated Creatinine Clearance: 57.7 mL/min (by C-G formula based on Cr of 0.6). Liver Function Tests:  Recent Labs Lab 05/02/16 0500  AST 48*  ALT 46  ALKPHOS 58  BILITOT 0.2*  PROT 6.6  ALBUMIN 3.3*   No results for input(s): LIPASE, AMYLASE in the last 168 hours. No results for input(s): AMMONIA in the last 168 hours. Coagulation Profile: No results for input(s): INR, PROTIME in the last 168 hours. Cardiac Enzymes: No results for input(s): CKTOTAL, CKMB, CKMBINDEX, TROPONINI in the last 168 hours. BNP (last 3 results) No results for input(s): PROBNP in the last 8760 hours. HbA1C: No results for input(s): HGBA1C in the last 72 hours. CBG: No results for input(s): GLUCAP in the last 168 hours. Lipid Profile: No results for input(s): CHOL, HDL, LDLCALC, TRIG, CHOLHDL, LDLDIRECT in the last 72 hours. Thyroid Function Tests: No results for input(s): TSH, T4TOTAL, FREET4, T3FREE, THYROIDAB in the last 72 hours. Anemia Panel: No results for input(s): VITAMINB12, FOLATE, FERRITIN, TIBC, IRON, RETICCTPCT in the last 72 hours. Urine analysis:    Component Value Date/Time   COLORURINE YELLOW 04/20/2016 1741   APPEARANCEUR CLEAR 04/20/2016 1741   LABSPEC 1.019 04/20/2016 1741   PHURINE 6.0 04/20/2016 1741   GLUCOSEU NEGATIVE 04/20/2016 1741   HGBUR NEGATIVE 04/20/2016 1741   BILIRUBINUR NEGATIVE 04/20/2016 1741   KETONESUR >80* 04/20/2016 1741   PROTEINUR NEGATIVE 04/20/2016 1741   UROBILINOGEN 1.0 08/14/2015 1140   NITRITE NEGATIVE 04/20/2016 1741   LEUKOCYTESUR NEGATIVE 04/20/2016 1741   Sepsis Labs: @LABRCNTIP (procalcitonin:4,lacticidven:4)  ) No results found for this or any previous visit (from the past 240 hour(s)).    Studies: Dg  Chest Port 1 View  05/04/2016  CLINICAL DATA:  Hypoxia.  COPD.  History of smoking. EXAM: PORTABLE CHEST 1 VIEW COMPARISON:  04/20/2016 FINDINGS: Cardiac silhouette is normal in size and configuration. Normal mediastinal and hilar contours. Clear lungs.  No pleural effusion or pneumothorax. Right PICC is new from the prior study. Tip lies in the lower superior vena cava. Bony thorax is intact. IMPRESSION: No active disease. Electronically Signed   By: Amie Portlandavid  Ormond M.D.   On: 05/04/2016 15:07    Scheduled Meds: . arformoterol  15 mcg Nebulization BID  . benztropine  0.5 mg Oral BID  . budesonide (PULMICORT) nebulizer solution  0.5 mg Nebulization BID  . DULoxetine  30 mg Oral Daily  .  enoxaparin (LOVENOX) injection  40 mg Subcutaneous Q24H  . feeding supplement (ENSURE ENLIVE)  237 mL Oral BID BM  . folic acid  1 mg Oral Daily  . gabapentin  300 mg Oral TID  . haloperidol  2 mg Oral TID  . multivitamin with minerals  1 tablet Oral Daily  . nicotine  21 mg Transdermal Daily  . pantoprazole  40 mg Oral BID AC  . sodium chloride flush  10-40 mL Intracatheter Q12H  . sodium chloride flush  3 mL Intravenous Q12H  . thiamine  100 mg Oral Daily    Continuous Infusions:     LOS: 14 days   Time spent: 25 minutes  Bartholomew Ramesh, MD [facility] Pager [phone]   If 7PM-7AM, please contact night-coverage [email or web address] Password [facility] 05/05/2016, 11:43 AM

## 2016-05-06 MED ORDER — NICOTINE 21 MG/24HR TD PT24
21.0000 mg | MEDICATED_PATCH | Freq: Every day | TRANSDERMAL | Status: DC
  Administered 2016-05-06 – 2016-05-12 (×8): 21 mg via TRANSDERMAL
  Filled 2016-05-06 (×8): qty 1

## 2016-05-06 NOTE — Progress Notes (Signed)
PROGRESS NOTE  Sherrilee GillesMichelle Fawaz ZOX:[id number] DOB: [birth year] DOA: 04/20/2016 PCP: No primary care provider on file.  HPI/Recap of past 3724 hours: 55 year old female admitted on 5/1 for alcohol detox. Patient reportedly drinks several pints of alcohol daily. She became quite agitated requiring IV Ativan and eventual Precedex drip. [facility] and patient able to be weaned off of drip and hospitalist reassumed care on 5/5. On night of 5/7, patient came more agitated requiring Precedex drip to be restarted. Able to weaned off by early morning of 5/9.  She is currently off precedex drip and ativan drip.   She was trasnferred to the floor on 5/10. Patient started getting agitated on 5/12 and she started pulling the tele wires and has fallen and tried to escape. Discussed with the friend at bedside and consulted psychiatry . She will need inpatient psychiatry hospitalization once her delirium improves.  On 5/13  she has been verbally abusive to the staff and tried leave the room, she received about 8 mg of IV ativan in the 12 hours and about 5 mg IV haldol and 4 mg po haldol.  She hit her head to the plastic cabinet when she was trying to get up.  She is not following the instructions and has been agitated.  She was transferred to step down and requested PCCM to see if she needs to be re started on precedex drip overnight.  5/14 she continues to be aggressive, and psychiatry following.  5/15 no new complaints.    Assessment/Plan: Principal Problem:   Alcohol withdrawal (HCC) secondary to alcohol abuse causing acute encephalopathy:  Precedex drip was weaned off. Critical care signed off.  . Monitor for another 24 hours in step down.  Encouraged her to work with PT, but she refused on many occasions.. Requested psychiatry consult as she doesn t appear to have the insight in to her medical problems and she is behaving irrationally. She would benefit from inpatient psychiatry  hospitalization. She was  transferred to step down.    She continues to be verbally and physically aggressive, abusive to the staff,.     Chronic hepatitis C without hepatic coma [facility](HCC); outpatient follow up.     Elevated LFTs repeat liver function panel show improvement .     Abdominal pain, vomiting, and diarrhea: appear to have resolved.   Anemia of chronic disease: Stable around 9.   Hypoxia requiring [place] oxygen, possibly from the sedating medication ativan she has been receiving the last 2 to 3 days.   repeat CXR does not show any active disease.    Code Status: Full code   Family Communication: NO FAMILY AT BEDSIDE.  [facility]    Consultants:  Critical care   Psychiatry.   Procedures:  None  Antimicrobials:  None   DVT prophylaxis:  Lovenox Objective: Filed Vitals:   05/06/16 0736 05/06/16 0829 05/06/16 1115 05/06/16 1526  BP:   116/61 127/53  Pulse:    72  Temp: 97.8 F (36.6 C)     TempSrc: Oral     Resp:   19 19  Height:      Weight:      SpO2:  98%  92%    Intake/Output Summary (Last 24 hours) at 05/06/16 1718 Last data filed at 05/06/16 1528  Gross per 24 hour  Intake   1000 ml  Output   1450 ml  Net   -450 ml   [employer] PowerFiled Weights  04/20/16 1815 04/25/16 0500  Weight: 52.6 kg (115 lb 15.4 oz) 54 kg (119 lb 0.8 oz)    Exam:   General:  Curled up in the bed, drowsy.   Cardiovascular: Regular rate and rhythm   Respiratory: Clear to auscultation bilaterally   Abdomen: Soft, nontender, nondistended, positive bowel sounds   Musculoskeletal: No clubbing or cyanosis or edema    Data Reviewed: CBC:  Recent Labs Lab 05/02/16 0500 05/04/16 1330  WBC 13.9* 9.2  HGB 8.6* 9.2*  HCT 27.6* 30.1*  MCV 93.9 93.2  PLT 497* 620*   Basic Metabolic Panel:  Recent Labs Lab 05/02/16 0500  NA 141  K 4.1  CL 103  CO2 27  GLUCOSE 119*  BUN 11  CREATININE 0.60  CALCIUM 8.9   GFR: Estimated Creatinine  Clearance: 57.1 mL/min (by C-G formula based on Cr of 0.6). Liver Function Tests:  Recent Labs Lab 05/02/16 0500  AST 48*  ALT 46  ALKPHOS 58  BILITOT 0.2*  PROT 6.6  ALBUMIN 3.3*   No results for input(s): LIPASE, AMYLASE in the last 168 hours. No results for input(s): AMMONIA in the last 168 hours. Coagulation Profile: No results for input(s): INR, PROTIME in the last 168 hours. Cardiac Enzymes: No results for input(s): CKTOTAL, CKMB, CKMBINDEX, TROPONINI in the last 168 hours. BNP (last 3 results) No results for input(s): PROBNP in the last 8760 hours. HbA1C: No results for input(s): HGBA1C in the last 72 hours. CBG: No results for input(s): GLUCAP in the last 168 hours. Lipid Profile: No results for input(s): CHOL, HDL, LDLCALC, TRIG, CHOLHDL, LDLDIRECT in the last 72 hours. Thyroid Function Tests: No results for input(s): TSH, T4TOTAL, FREET4, T3FREE, THYROIDAB in the last 72 hours. Anemia Panel: No results for input(s): VITAMINB12, FOLATE, FERRITIN, TIBC, IRON, RETICCTPCT in the last 72 hours. Urine analysis:    Component Value Date/Time   COLORURINE YELLOW 04/20/2016 1741   APPEARANCEUR CLEAR 04/20/2016 1741   LABSPEC 1.019 04/20/2016 1741   PHURINE 6.0 04/20/2016 1741   GLUCOSEU NEGATIVE 04/20/2016 1741   HGBUR NEGATIVE 04/20/2016 1741   BILIRUBINUR NEGATIVE 04/20/2016 1741   KETONESUR >80* 04/20/2016 1741   PROTEINUR NEGATIVE 04/20/2016 1741   UROBILINOGEN 1.0 08/14/2015 1140   NITRITE NEGATIVE 04/20/2016 1741   LEUKOCYTESUR NEGATIVE 04/20/2016 1741   Sepsis Labs: (procalcitonin:4,lacticidven:4)  ) No results found for this or any previous visit (from the past 240 hour(s)).    Studies: No results found.  Scheduled Meds: . arformoterol  15 mcg Nebulization BID  . benztropine  0.5 mg Oral BID  . budesonide (PULMICORT) nebulizer solution  0.5 mg Nebulization BID  . DULoxetine  30 mg Oral Daily  . enoxaparin (LOVENOX) injection  40 mg  Subcutaneous Q24H  . feeding supplement (ENSURE ENLIVE)  237 mL Oral BID BM  . folic acid  1 mg Oral Daily  . gabapentin  300 mg Oral TID  . haloperidol  2 mg Oral TID  . multivitamin with minerals  1 tablet Oral Daily  . nicotine  21 mg Transdermal Daily  . pantoprazole  40 mg Oral BID AC  . sodium chloride flush  10-40 mL Intracatheter Q12H  . sodium chloride flush  3 mL Intravenous Q12H  . thiamine  100 mg Oral Daily    Continuous Infusions:     LOS: 15 days   Time spent: 25 minutes  Dareen Gutzwiller, MD [facility] Pager [phone]   If 7PM-7AM, please contact night-coverage [email or web address] Password [facility] 05/06/2016, 5:18  PM    

## 2016-05-06 NOTE — Progress Notes (Signed)
Writer was trying to put pt's legs back in bed for safety and Pt kicked Clinical research associatewriter in Engineer, agriculturalstomach and on writer side with both feet. Writer had to push the panic button on Actuarywriter locater. Safety protal was done. RN and TEFL teacherCharge RN and ICU Director are aware.

## 2016-05-06 NOTE — Progress Notes (Signed)
Nursing note late entry:  Pt continues to have long periods of agitation,verbal abuse, getting out of bed without assistance, patient taken to bathroom 3 times without success, ambulated in halls, gait very unsteady, haldol and ativan given, behavior continued to escalate.  Placed back in restraints Dr Blake DivineAkula notified, orders received.  IVC papers served on patient

## 2016-05-06 NOTE — BH Assessment (Signed)
[facility] Assessment Progress Note  At 09:38 this Clinical research associatewriter [facility]  Pt has been declined for admission to their facility due to her acuity.  Doylene Canninghomas Orissa Arreaga, MA Triage Specialist [phone]

## 2016-05-06 NOTE — Progress Notes (Signed)
LCSW following for assistance with psychiatric placement and disposition. At this time patient is NOT MEDICALLY STABLE for placement in psychiatric facility. [facility] and [facility] due to acute delirium secondary from alcohol withdrawal and feel patient is not medically clear.  Patient can be re-referred once cognitive level has improved. At this time she is under IVC.  Case discussed with medical director: Geoffry Paradiseichard Aronson who [facility] and discussed with psychiatrist Dr. Shela CommonsJ.  Recommendations: Continue scheduled medications and routine with patient.   Patient is currently under IVC. Papers served today.  Will continue to follow and assist with disposition.  Rhonda EmoryHannah Cortina Vultaggio LCSW, MSW Clinical Social Work: System [employer] [phone]

## 2016-05-07 MED ORDER — SENNOSIDES-DOCUSATE SODIUM 8.6-50 MG PO TABS
2.0000 | ORAL_TABLET | Freq: Every evening | ORAL | Status: DC | PRN
  Administered 2016-05-07: 2 via ORAL
  Filled 2016-05-07: qty 2

## 2016-05-07 MED ORDER — CLONAZEPAM 1 MG PO TABS
1.0000 mg | ORAL_TABLET | Freq: Two times a day (BID) | ORAL | Status: DC
  Administered 2016-05-07: 1 mg via ORAL
  Filled 2016-05-07: qty 1

## 2016-05-07 MED ORDER — CLONAZEPAM 1 MG PO TABS
1.0000 mg | ORAL_TABLET | Freq: Three times a day (TID) | ORAL | Status: DC | PRN
  Administered 2016-05-07 – 2016-05-09 (×3): 1 mg via ORAL
  Filled 2016-05-07 (×3): qty 1

## 2016-05-07 MED ORDER — PANTOPRAZOLE SODIUM 40 MG PO TBEC
40.0000 mg | DELAYED_RELEASE_TABLET | Freq: Every day | ORAL | Status: DC
  Administered 2016-05-08 – 2016-05-12 (×5): 40 mg via ORAL
  Filled 2016-05-07 (×5): qty 1

## 2016-05-07 MED ORDER — POLYETHYLENE GLYCOL 3350 17 G PO PACK
17.0000 g | PACK | Freq: Every day | ORAL | Status: DC | PRN
  Administered 2016-05-07: 17 g via ORAL
  Filled 2016-05-07: qty 1

## 2016-05-07 NOTE — Consult Note (Signed)
[facility] Face-to-Face Psychiatry Consult Follow Up  Reason for Consult:  Alcohol intoxication and withdrawal and depression Referring Physician:  Dr. Karleen Hampshire Patient Identification: Rhonda Mitchell MRN:  [id number] Principal Diagnosis: Alcohol withdrawal [facility]) Diagnosis:   Patient Active Problem List   Diagnosis Date Noted  . Acute encephalopathy [G93.40]   . Centrilobular emphysema ([place]) [J43.2]   . Encephalopathy acute [G93.40] 04/23/2016  . Alcohol dependence with alcohol-induced mood disorder ([place]) [F10.24] 03/05/2016  . Abnormality of gait [R26.9]   . Lower extremity weakness [R29.898]   . Pressure ulcer [L89.90] 02/26/2016  . Tachypnea [R06.82]   . Depression [F32.9]   . Hypernatremia [E87.0] 02/20/2016  . Anemia [D64.9] 02/20/2016  . UTI (lower urinary tract infection) [N39.0]   . Abdominal pain, vomiting, and diarrhea [R10.9, R11.10, R19.7] 12/18/2015  . Alcohol abuse [F10.10] 12/18/2015  . Abdominal pain [R10.9] 12/18/2015  . Elevated blood pressure [R03.0] 12/18/2015  . Severe recurrent major depression without psychotic features ([place]) [F33.2] 10/28/2015  . Alcohol dependence with uncomplicated withdrawal ([place]) [F10.230] 10/27/2015  . Alcohol-induced mood disorder ([place]) [F10.94] 10/27/2015  . Psychosis [F29] 08/21/2015  . Hypomagnesemia [E83.42]   . Protein-calorie malnutrition ([place]) [E46]   . Hepatitis C antibody test positive [R89.4]   . Elevated LFTs [R79.89]   . Hyperkalemia [E87.5]   . Severe protein-calorie malnutrition ([place]) [E43]   . Acute respiratory failure with hypoxia ([place]) [J96.01] 08/03/2015  . Septic olecranon bursitis of right elbow [M70.21, B96.89] 05/08/2015  . Septic bursitis of elbow [M71.129] 05/07/2015  . Thrombocytopenia ([place]) [D69.6] 05/07/2015  . Chronic hepatitis C without hepatic coma ([place]) [B18.2] 05/07/2015  . Hypokalemia [E87.6] 05/07/2015  . Abscess of bursa of elbow [M71.029] 05/07/2015  . COPD exacerbation ([place]) [J44.1] 03/18/2015  .  Hyponatremia [E87.1] 03/18/2015  . Hyperglycemia [R73.9] 03/18/2015  . Alcohol dependence ([place]) [F10.20] 12/07/2014  . Alcohol withdrawal ([place]) [F10.239] 12/07/2014  . Protein-calorie malnutrition, severe ([place]) [E43] 12/07/2014  . Musculoskeletal chest pain [R07.89] 12/07/2014  . Tobacco abuse [Z72.0] 12/07/2014  . Carbuncle and furuncle [L02.92, L02.93] 12/07/2014  . Macrocytic anemia [D53.9] 12/07/2014  . Chest pain [R07.9] 11/30/2014  . Tachycardia [R00.0] 11/30/2014  . COPD (chronic obstructive pulmonary disease) ([place]) [J44.9] 11/30/2014  . Cellulitis and abscess [L03.90, L02.91] 11/30/2014  . Atypical chest pain [R07.89]   . Dehydration [E86.0]     Total Time spent with patient: 20 minutes  Subjective:   Rhonda Mitchell is a 55 y.o. female patient admitted with abdominal pain, nausea and withdrawal shakes.  HPI:  Rhonda Mitchell is a 55 y.o. female, seen, chart reviewed for the face-to-face psychiatric consultation and evaluation increased agitation and alcohol withdrawal symptoms. Patient is a poor historian and seems to be confused and could not provide linear and goal-directed history even after 10 days being in hospital and seems like it medically stable. Patient is psychiatrically not stable to continue to be irritable, as stated and cursing and yelling at staff members. Review of medical records indicated that patient presented to the emergency department with the epigastric abdominal pain, nausea and withdrawal shakes. Patient is known to the psychiatric services from her multiple hospital encounter some admissions for alcohol withdrawal symptoms and detox treatment. Patient reportedly stopped drinking alcohol a few days ago and seems to be confused does not even know when she came to the hospital. Patient is noncompliant with medical advises, showed up to the emergency department 8 times in the past 6 months of 4 time she was admitted. She was recently in the  hospital from March  1 till March 20 for alcohol detoxification which required drug-induced coma. Past Psychiatric History: She has multiple hospital admission for alcohol detox treatments.  Interval History: Patient seen for psychiatric consultation follow-up today, and case discussed with the staff RN and Dr. Jerral Ralph. Patient appeared lying in her bed, calm and cooperative and requesting to be discharged to home with her friend. Patient has intact cognitions including orientation, concentration and memory functions. Patient has intact language. Patient does get an attitude and irritability when she does not get her way which is her baseline. Patient does not appear to be in delirium any longer and has no known alcohol withdrawal symptoms. Patient hospitalization is complicated with intermittent agitation and aggressive behaviors. Patient was informed she needs to behave at least 24 hours before going home patient agreed. Patient stated I'm not going to be nasty to anybody.   Risk to Self: Is patient at risk for suicide?: No Risk to Others:   Prior Inpatient Therapy:   Prior Outpatient Therapy:    Past Medical History:  Past Medical History  Diagnosis Date  . COPD (chronic obstructive pulmonary disease) (HCC)   . Anxiety   . Shortness of breath   . Arthritis   . GERD (gastroesophageal reflux disease)   . Full dentures   . Insomnia   . Alcohol abuse   . Hepatitis C   . Alcohol abuse   . History of MRSA infection   . History of encephalopathy     Past Surgical History  Procedure Laterality Date  . Arm surgery    . Hand surgery  1990    both rt/lt carpal tunnels  . Shoulder surgery      right and left-age [age]  . Orif ulnar / radial shaft fracture  1990    right-got infection-had total 10 surgeies 1990  . Orif ulnar fracture Left 05/23/2014    Procedure: OPEN REDUCTION INTERNAL FIXATION (ORIF) LEFT ULNAR FRACTURE;  Surgeon: Harvie Junior, MD;  Location: [facility];  Service: Orthopedics;   Laterality: Left;  . I&d extremity Right 05/07/2015    Procedure: IRRIGATION AND DEBRIDEMENT  ELBOW ;  Surgeon: Dominica Severin, MD;  Location: [facility] OR;  Service: Orthopedics;  Laterality: Right;   Family History:  Family History  Problem Relation Age of Onset  . Breast cancer Mother    Family Psychiatric  History: Unknown Social History:  History  Alcohol Use  . Yes    Comment: 1/5th Vodka every day      History  Drug Use No    Comment: Hx: herion, cocaine 5 yrs ago    Social History   Social History  . Marital Status: Legally Separated    Spouse Name: N/A  . Number of Children: N/A  . Years of Education: N/A   Social History Main Topics  . Smoking status: Current Every Day Smoker -- 1.00 packs/day for 20 years    Types: Cigarettes  . Smokeless tobacco: Never Used  . Alcohol Use: Yes     Comment: 1/5th Vodka every day   . Drug Use: No     Comment: Hx: herion, cocaine 5 yrs ago  . Sexual Activity: Yes    Birth Control/ Protection: Post-menopausal   Other Topics Concern  . None   Social History Narrative   Additional Social History:    Allergies:  No Known Allergies  Labs: No results found for this or any previous visit (from the past 48 hour(s)).  Current Facility-Administered Medications  Medication Dose Route Frequency Provider Last Rate Last Dose  . acetaminophen (TYLENOL) tablet 650 mg  650 mg Oral Q6H PRN [name], NP   650 mg at 05/04/16 1816  . arformoterol (BROVANA) nebulizer solution 15 mcg  15 mcg Nebulization BID [name], NP   15 mcg at 05/06/16 1610  . benztropine (COGENTIN) tablet 0.5 mg  0.5 mg Oral BID [name], NP   0.5 mg at 05/07/16 1033  . budesonide (PULMICORT) nebulizer solution 0.5 mg  0.5 mg Nebulization BID [name], NP   0.5 mg at 05/06/16 0829  . DULoxetine (CYMBALTA) DR capsule 30 mg  30 mg Oral Daily [name], NP   30 mg at 05/07/16 1033  . enoxaparin (LOVENOX) injection 40 mg  40 mg Subcutaneous  Q24H Maretta Bees, MD   40 mg at 05/06/16 1520  . feeding supplement (ENSURE ENLIVE) (ENSURE ENLIVE) liquid 237 mL  237 mL Oral BID BM Clydia Llano, MD   Stopped at 05/07/16 1000  . folic acid (FOLVITE) tablet 1 mg  1 mg Oral Daily Clydia Llano, MD   1 mg at 05/07/16 1033  . gabapentin (NEURONTIN) capsule 300 mg  300 mg Oral TID [name], NP   300 mg at 05/07/16 1033  . gi cocktail (Maalox,Lidocaine,Donnatal)  30 mL Oral TID PRN Clydia Llano, MD   30 mL at 05/05/16 1731  . guaiFENesin-dextromethorphan (ROBITUSSIN DM) 100-10 MG/5ML syrup 5 mL  5 mL Oral Q4H PRN Kathlen Mody, MD   5 mL at 05/05/16 2348  . haloperidol (HALDOL) tablet 2 mg  2 mg Oral TID [name], NP   Stopped at 05/07/16 1000  . haloperidol lactate (HALDOL) injection 5 mg  5 mg Intravenous Q6H PRN Kathlen Mody, MD   5 mg at 05/05/16 2208  . LORazepam (ATIVAN) injection 2 mg  2 mg Intravenous Q2H PRN Leda Gauze, NP   2 mg at 05/06/16 2230  . menthol-cetylpyridinium (CEPACOL) lozenge 3 mg  1 lozenge Oral PRN Kathlen Mody, MD      . metoprolol (LOPRESSOR) injection 10 mg  10 mg Intravenous Q6H PRN Clydia Llano, MD      . multivitamin with minerals tablet 1 tablet  1 tablet Oral Daily Clydia Llano, MD   1 tablet at 05/07/16 1034  . nicotine (NICODERM CQ - dosed in mg/24 hours) patch 21 mg  21 mg Transdermal Daily Leda Gauze, NP   21 mg at 05/07/16 1033  . ondansetron (ZOFRAN) tablet 4 mg  4 mg Oral Q6H PRN Clydia Llano, MD       Or  . ondansetron (ZOFRAN) injection 4 mg  4 mg Intravenous Q6H PRN Clydia Llano, MD   4 mg at 05/05/16 0140  . oxyCODONE (Oxy IR/ROXICODONE) immediate release tablet 5 mg  5 mg Oral Q6H PRN [name], NP   5 mg at 05/07/16 0024  . pantoprazole (PROTONIX) EC tablet 40 mg  40 mg Oral BID AC Clydia Llano, MD   40 mg at 05/07/16 0828  . sodium chloride flush (NS) 0.9 % injection 10-40 mL  10-40 mL Intracatheter PRN Hollice Espy, MD   20 mL at 05/02/16 0430  . sodium  chloride flush (NS) 0.9 % injection 10-40 mL  10-40 mL Intracatheter Q12H Hollice Espy, MD   10 mL at 05/07/16 1000  . sodium chloride flush (NS) 0.9 % injection 3 mL  3 mL Intravenous Q12H Clydia LlanoMutaz Elmahi, MD   3 mL at 05/07/16 1000  . thiamine (VITAMIN B-1) tablet 100 mg  100 mg Oral Daily Clydia LlanoMutaz Elmahi, MD   100 mg at 05/07/16 1034    Musculoskeletal: Strength & Muscle Tone: decreased Gait & Station: unable to stand Patient leans: N/A  Psychiatric Specialty Exam: ROS   Blood pressure 127/53, pulse 67, temperature 98.4 F (36.9 C), temperature source Axillary, resp. rate 18, height 5' (1.524 m), weight 54 kg (119 lb 0.8 oz), SpO2 91 %.Body mass index is 23.25 kg/(m^2).  General Appearance: Casual  Eye Contact::  Fair  Speech:  Clear and Coherent  Volume:  Normal  Mood:  Irritable  Affect:  Labile  Thought Process:  Coherent and Loose  Orientation:  Full (Time, Place, and Person)  Thought Content:  WDL  Suicidal Thoughts:  No  Homicidal Thoughts:  No  Memory:  Immediate;   Good Recent;   Good  Judgement:  Fair  Insight:  Fair  Psychomotor Activity:  Normal  Concentration:  Fair  Recall:  Good  Fund of Knowledge:Fair  Language: Fair  Akathisia:  Negative  Handed:  Right  AIMS (if indicated):     Assets:  Communication Skills Desire for Improvement Financial Resources/Insurance Leisure Time Resilience Social Support Transportation  ADL's:  Intact  Cognition: WNL  Sleep:      Treatment Plan Summary: Patient has no alcohol withdrawal symptoms including delirium which was improved during this hospitalization. Patient appeared to be at her baseline during this visit  Case discussed with the Dr. Jerral RalphGhimire Discontinue safety sitter as patient is able to contract for safety. Continue Haldol 2 mg 3 times daily for agitation and aggressive behavior Continue Cogentin 0.5 mg 2 times daily  for EPS  Continue Cymbalta 30 mg daily for depression  Continue Neurontin 300 mg 3  times daily for mood swings  Patient does not meet criteria for acute psychiatric hospitalization as patient has been contracting for safety and seems to be at baseline.   Patient will be discharged to home with her friend and then recommended outpatient medication management.  Disposition: Refer to the unit social service for outpatient medication management.  Patient has improved from alcohol delirium and has intact cognitions  Patient does not meet criteria for psychiatric inpatient admission. Supportive therapy provided about ongoing stressors.  Nehemiah SettleJONNALAGADDA,JANARDHAHA R., MD 05/07/2016 10:56 AM
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
## 2016-05-08 LAB — CBC
HEMATOCRIT: 28.5 % — AB (ref 36.0–46.0)
HEMOGLOBIN: 8.9 g/dL — AB (ref 12.0–15.0)
MCH: 27.7 pg (ref 26.0–34.0)
MCHC: 31.2 g/dL (ref 30.0–36.0)
MCV: 88.8 fL (ref 78.0–100.0)
Platelets: 514 10*3/uL — ABNORMAL HIGH (ref 150–400)
RBC: 3.21 MIL/uL — AB (ref 3.87–5.11)
RDW: 18.2 % — AB (ref 11.5–15.5)
WBC: 8.6 10*3/uL (ref 4.0–10.5)

## 2016-05-08 LAB — COMPREHENSIVE METABOLIC PANEL
ALBUMIN: 3.4 g/dL — AB (ref 3.5–5.0)
ALK PHOS: 60 U/L (ref 38–126)
ALT: 60 U/L — ABNORMAL HIGH (ref 14–54)
ANION GAP: 8 (ref 5–15)
AST: 58 U/L — AB (ref 15–41)
BUN: 19 mg/dL (ref 6–20)
CALCIUM: 8.8 mg/dL — AB (ref 8.9–10.3)
CO2: 27 mmol/L (ref 22–32)
Chloride: 95 mmol/L — ABNORMAL LOW (ref 101–111)
Creatinine, Ser: 0.64 mg/dL (ref 0.44–1.00)
GFR calc Af Amer: >60 mL/min (ref >60)
GFR calc non Af Amer: >60 mL/min (ref >60)
GLUCOSE: 139 mg/dL — AB (ref 65–99)
Potassium: 3.7 mmol/L (ref 3.5–5.1)
SODIUM: 130 mmol/L — AB (ref 135–145)
Total Bilirubin: 0.3 mg/dL (ref 0.3–1.2)
Total Protein: 7.2 g/dL (ref 6.5–8.1)

## 2016-05-08 LAB — MAGNESIUM: Magnesium: 1.9 mg/dL (ref 1.7–2.4)

## 2016-05-08 LAB — PHOSPHORUS: Phosphorus: 5.8 mg/dL — ABNORMAL HIGH (ref 2.5–4.6)

## 2016-05-08 MED ORDER — LORAZEPAM 2 MG/ML IJ SOLN
2.0000 mg | Freq: Four times a day (QID) | INTRAMUSCULAR | Status: DC | PRN
  Administered 2016-05-08 – 2016-05-10 (×6): 2 mg via INTRAVENOUS
  Filled 2016-05-08 (×7): qty 1

## 2016-05-08 MED ORDER — LORAZEPAM 2 MG/ML IJ SOLN: 2.0000 mg | Freq: Once | INTRAMUSCULAR | Status: AC

## 2016-05-08 MED ORDER — LORAZEPAM 2 MG/ML IJ SOLN
INTRAMUSCULAR | Status: AC
  Filled 2016-05-08: qty 1
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
## 2016-05-08 NOTE — Evaluation (Signed)
Physical Therapy Re-Evaluation Patient Details Name: Rhonda Mitchell MRN: [id number] DOB: [birth year] Today's Date: 05/08/2016   History of Present Illness  Rhonda Mitchell is a 55 y.o. female with a history of ETOH Abuse, COPD, Hep C admitted with burning Epigastric ABD Pain and SOB, and Nausea Vomiting and Diarrhea.She drinks 3-4 pints of liquor daily and has done so for many years . Her Alcohol level was found at 298. She has a UTI and placed on ETOH withdrawal protocol  Clinical Impression  Patient reevaluated by Physical Therapy with no further acute PT needs identified. All education has been completed and the patient has no further questions. Pt ambulated around entire unit twice, initially unsteady however improved with distance. Recommended pt use an assistive device (has RW and SPC) at home if feeling poorly or unsteady. Would recommend initial supervision for mobility for safety. PT is signing off. Thank you for this referral.     Follow Up Recommendations No PT follow up;Supervision for mobility/OOB    Equipment Recommendations  None recommended by PT    Recommendations for Other Services       Precautions / Restrictions Precautions Precautions: Fall      Mobility  Bed Mobility Overal bed mobility: Modified Independent                Transfers Overall transfer level: Needs assistance Equipment used: None Transfers: Sit to/from Stand Sit to Stand: Supervision Stand pivot transfers: Supervision       General transfer comment: unsteady upon standing however pt self corrected  Ambulation/Gait Ambulation/Gait assistance: Min guard;Supervision Ambulation Distance (Feet): 400 Feet Assistive device: None Gait Pattern/deviations: Step-through pattern;Decreased stride length;Wide base of support     General Gait Details: very unsteady initially however improved with distance, pt agreeable to use SPC or RW at home if feeling unsteady  Stairs             Wheelchair Mobility    Modified Rankin (Stroke Patients Only)       Balance Overall balance assessment: Needs assistance         Standing balance support: No upper extremity supported Standing balance-Leahy Scale: Fair                               Pertinent Vitals/Pain Pain Assessment: No/denies pain    Home Living Family/patient expects to be discharged to:: Private residence Living Arrangements: Non-relatives/Friends Available Help at Discharge: Available PRN/intermittently Type of Home: House Home Access: Stairs to enter   [employer] of Steps: 2 Home Layout: One level Home Equipment: Environmental consultant - 2 wheels;Cane - single point      Prior Function Level of Independence: Independent         Comments: pt drank 3-4 pints of vodka daily and smokes 2 packs a cigarettes a day     Hand Dominance   Dominant Hand: Right    Extremity/Trunk Assessment               Lower Extremity Assessment: Generalized weakness         Communication   Communication: No difficulties  Cognition Arousal/Alertness: Awake/alert Behavior During Therapy: Impulsive Overall Cognitive Status:  (has been agitated/combative during acute stay however today was cooperative)                      General Comments      Exercises        Assessment/Plan  PT Assessment Patent does not need any further PT services  PT Diagnosis Difficulty walking   PT Problem List    PT Treatment Interventions     PT Goals (Current goals can be found in the Care Plan section) Acute Rehab PT Goals PT Goal Formulation: All assessment and education complete, DC therapy    Frequency     Barriers to discharge        Co-evaluation               End of Session Equipment Utilized During Treatment: Gait belt Activity Tolerance: Patient tolerated treatment well Patient left: with nursing/sitter in room [facility] with safety sitter)           Time:  [id number] PT Time Calculation (min) (ACUTE ONLY): 12 min   Charges:   PT Evaluation $PT Re-evaluation: 1 Procedure     PT G Codes:        Rhonda Mitchell,Rhonda Mitchell 05/08/2016, 3:04 PM Zenovia JarredKati Jaime Mitchell, PT, DPT 05/08/2016 Pager: [phone]

## 2016-05-08 NOTE — Progress Notes (Signed)
LCSW has spoken and reviewed case with psych MD. Patient cleared by psych and not meeting criteria at this time for inpatient acute care. Patient is currently under IVC. LCSW will complete change of commitment form once MD is ready to discharge.  Updated unit Director and AD along with attending RN. Will follow acutely for needs. Will speak to patient once able too (at 9:30am patient would not awake, was given Haldol around 6:30 will reattempts around 11am) and discuss outpatient resources.  No other needs at this time.   Plan: DC home with medication management services.  Deretha EmoryHannah Finis Hendricksen LCSW, MSW Clinical Social Work: [employer] [phone]
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
## 2016-05-08 NOTE — Progress Notes (Signed)
PT Cancellation Note  Patient Details Name: Rhonda GillesMichelle Mitchell MRN: [id number] DOB: 29-Apr-1961   Cancelled Treatment:    Reason Eval/Treat Not Completed: Other (comment) Discussed pt with RN who reports pt is in 5 point restraints.  RN will assist pt with ambulation today and notify PT if more needs are truly required.   Delilah Mulgrew,KATHrine E 05/08/2016, 11:18 AM Zenovia JarredKati Zahniya Zellars, PT, DPT 05/08/2016 Pager: [phone]

## 2016-05-08 NOTE — Progress Notes (Signed)
Patient pulled out PICC line at 2100. Catheter intact, 36cm.  No bleeding at site. Pressure applied and dressing placed. [facility] NP notified. IV team consulted to obtain PIV access. Will continue to monitor.

## 2016-05-09 MED ORDER — HALOPERIDOL 5 MG PO TABS
5.0000 mg | ORAL_TABLET | Freq: Two times a day (BID) | ORAL | Status: DC
  Administered 2016-05-09 – 2016-05-12 (×5): 5 mg via ORAL
  Filled 2016-05-09 (×7): qty 1

## 2016-05-09 MED ORDER — BENZTROPINE MESYLATE 0.5 MG PO TABS
1.0000 mg | ORAL_TABLET | Freq: Two times a day (BID) | ORAL | Status: DC
  Administered 2016-05-09 – 2016-05-12 (×6): 1 mg via ORAL
  Filled 2016-05-09 (×6): qty 2

## 2016-05-09 MED ORDER — CHLORDIAZEPOXIDE HCL 10 MG PO CAPS
10.0000 mg | ORAL_CAPSULE | Freq: Three times a day (TID) | ORAL | Status: DC
  Administered 2016-05-09: 10 mg via ORAL
  Filled 2016-05-09: qty 1

## 2016-05-09 MED ORDER — CHLORDIAZEPOXIDE HCL 25 MG PO CAPS
25.0000 mg | ORAL_CAPSULE | Freq: Two times a day (BID) | ORAL | Status: DC
  Administered 2016-05-09: 25 mg via ORAL
  Filled 2016-05-09: qty 1

## 2016-05-09 MED ORDER — CHLORDIAZEPOXIDE HCL 25 MG PO CAPS
25.0000 mg | ORAL_CAPSULE | Freq: Three times a day (TID) | ORAL | Status: DC
  Administered 2016-05-09: 25 mg via ORAL
  Filled 2016-05-09: qty 1

## 2016-05-09 MED ORDER — LORAZEPAM 2 MG/ML IJ SOLN
2.0000 mg | Freq: Once | INTRAMUSCULAR | Status: AC
  Administered 2016-05-09: 2 mg via INTRAVENOUS

## 2016-05-09 MED ORDER — CHLORDIAZEPOXIDE HCL 25 MG PO CAPS: 25.0000 mg | ORAL_CAPSULE | Freq: Three times a day (TID) | ORAL | Status: DC

## 2016-05-09 MED ORDER — GABAPENTIN 400 MG PO CAPS
400.0000 mg | ORAL_CAPSULE | Freq: Three times a day (TID) | ORAL | Status: DC
  Administered 2016-05-09 – 2016-05-12 (×9): 400 mg via ORAL
  Filled 2016-05-09 (×2): qty 1
  Filled 2016-05-09: qty 4
  Filled 2016-05-09: qty 1
  Filled 2016-05-09: qty 4
  Filled 2016-05-09 (×5): qty 1

## 2016-05-09 MED ORDER — HALOPERIDOL LACTATE 5 MG/ML IJ SOLN
5.0000 mg | Freq: Four times a day (QID) | INTRAMUSCULAR | Status: DC | PRN
  Administered 2016-05-09: 5 mg via INTRAMUSCULAR
  Filled 2016-05-09: qty 1

## 2016-05-09 NOTE — Progress Notes (Signed)
Patient refusing to having morning BP taken.

## 2016-05-09 NOTE — Progress Notes (Signed)
This Clinical research associatewriter completed a chart review for psych consult.       Rhonda Mitchell, MSW, Clare CharonLCSW, LCAS [facility] Triage Specialist [phone]
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
## 2016-05-09 NOTE — Consult Note (Signed)
[facility] Face-to-Face Psychiatry Consult Follow Up  Reason for Consult:  Alcohol intoxication and withdrawal and depression Referring Physician:  Dr. Karleen Hampshire Patient Identification: Rhonda Mitchell MRN:  [id number] Principal Diagnosis: Alcohol withdrawal [facility]) Diagnosis:   Patient Active Problem List   Diagnosis Date Noted  . Acute encephalopathy [G93.40]   . Centrilobular emphysema ([place]) [J43.2]   . Encephalopathy acute [G93.40] 04/23/2016  . Alcohol dependence with alcohol-induced mood disorder ([place]) [F10.24] 03/05/2016  . Abnormality of gait [R26.9]   . Lower extremity weakness [R29.898]   . Pressure ulcer [L89.90] 02/26/2016  . Tachypnea [R06.82]   . Depression [F32.9]   . Hypernatremia [E87.0] 02/20/2016  . Anemia [D64.9] 02/20/2016  . UTI (lower urinary tract infection) [N39.0]   . Abdominal pain, vomiting, and diarrhea [R10.9, R11.10, R19.7] 12/18/2015  . Alcohol abuse [F10.10] 12/18/2015  . Abdominal pain [R10.9] 12/18/2015  . Elevated blood pressure [R03.0] 12/18/2015  . Severe recurrent major depression without psychotic features ([place]) [F33.2] 10/28/2015  . Alcohol dependence with uncomplicated withdrawal ([place]) [F10.230] 10/27/2015  . Alcohol-induced mood disorder ([place]) [F10.94] 10/27/2015  . Psychosis [F29] 08/21/2015  . Hypomagnesemia [E83.42]   . Protein-calorie malnutrition ([place]) [E46]   . Hepatitis C antibody test positive [R89.4]   . Elevated LFTs [R79.89]   . Hyperkalemia [E87.5]   . Severe protein-calorie malnutrition ([place]) [E43]   . Acute respiratory failure with hypoxia ([place]) [J96.01] 08/03/2015  . Septic olecranon bursitis of right elbow [M70.21, B96.89] 05/08/2015  . Septic bursitis of elbow [M71.129] 05/07/2015  . Thrombocytopenia ([place]) [D69.6] 05/07/2015  . Chronic hepatitis C without hepatic coma ([place]) [B18.2] 05/07/2015  . Hypokalemia [E87.6] 05/07/2015  . Abscess of bursa of elbow [M71.029] 05/07/2015  . COPD exacerbation ([place]) [J44.1] 03/18/2015  .  Hyponatremia [E87.1] 03/18/2015  . Hyperglycemia [R73.9] 03/18/2015  . Alcohol dependence ([place]) [F10.20] 12/07/2014  . Alcohol withdrawal ([place]) [F10.239] 12/07/2014  . Protein-calorie malnutrition, severe ([place]) [E43] 12/07/2014  . Musculoskeletal chest pain [R07.89] 12/07/2014  . Tobacco abuse [Z72.0] 12/07/2014  . Carbuncle and furuncle [L02.92, L02.93] 12/07/2014  . Macrocytic anemia [D53.9] 12/07/2014  . Chest pain [R07.9] 11/30/2014  . Tachycardia [R00.0] 11/30/2014  . COPD (chronic obstructive pulmonary disease) ([place]) [J44.9] 11/30/2014  . Cellulitis and abscess [L03.90, L02.91] 11/30/2014  . Atypical chest pain [R07.89]   . Dehydration [E86.0]     Total Time spent with patient: 20 minutes  Subjective:   Rhonda Mitchell is a 55 y.o. female patient admitted with abdominal pain, nausea and withdrawal shakes.  HPI:  Rhonda Mitchell is a 55 y.o. female, seen, chart reviewed for the face-to-face psychiatric consultation and evaluation increased agitation and alcohol withdrawal symptoms. Patient is a poor historian and seems to be confused and could not provide linear and goal-directed history even after 10 days being in hospital and seems like it medically stable. Patient is psychiatrically not stable to continue to be irritable, as stated and cursing and yelling at staff members. Review of medical records indicated that patient presented to the emergency department with the epigastric abdominal pain, nausea and withdrawal shakes. Patient is known to the psychiatric services from her multiple hospital encounter some admissions for alcohol withdrawal symptoms and detox treatment. Patient reportedly stopped drinking alcohol a few days ago and seems to be confused does not even know when she came to the hospital. Patient is noncompliant with medical advises, showed up to the emergency department 8 times in the past 6 months of 4 time she was admitted. She was recently in the  hospital from March  1 till March 20 for alcohol detoxification which required drug-induced coma. Past Psychiatric History: She has multiple hospital admission for alcohol detox treatments.  Interval History: Patient seen for psychiatric consultation follow-up today, and case discussed with the staff RN and Dr. Sloan Leiter. Patient appeared lying in her bed, calm and cooperative and requesting to be discharged to home with her friend. Patient has intact cognitions including orientation, concentration and memory functions. Patient has intact language. Patient does get an attitude and irritability when she does not get her way which is her baseline. Patient does not appear to be in delirium any longer and has no known alcohol withdrawal symptoms. Patient hospitalization is complicated with intermittent agitation and aggressive behaviors. Patient was informed she needs to behave at least 24 hours before going home patient agreed. Patient stated I'm not going to be nasty to anybody.   Risk to Self: Is patient at risk for suicide?: No Risk to Others:   Prior Inpatient Therapy:   Prior Outpatient Therapy:    Past Medical History:  Past Medical History  Diagnosis Date  . COPD (chronic obstructive pulmonary disease) ([place])   . Anxiety   . Shortness of breath   . Arthritis   . GERD (gastroesophageal reflux disease)   . Full dentures   . Insomnia   . Alcohol abuse   . Hepatitis C   . Alcohol abuse   . History of MRSA infection   . History of encephalopathy     Past Surgical History  Procedure Laterality Date  . Arm surgery    . Hand surgery  1990    both rt/lt carpal tunnels  . Shoulder surgery      right and left-age [age]  . Orif ulnar / radial shaft fracture  1990    right-got infection-had total 10 surgeies 1990  . Orif ulnar fracture Left 05/23/2014    Procedure: OPEN REDUCTION INTERNAL FIXATION (ORIF) LEFT ULNAR FRACTURE;  Surgeon: Alta Corning, MD;  Location: [facility];  Service: Orthopedics;   Laterality: Left;  . I&d extremity Right 05/07/2015    Procedure: IRRIGATION AND DEBRIDEMENT  ELBOW ;  Surgeon: Roseanne Kaufman, MD;  Location: [facility];  Service: Orthopedics;  Laterality: Right;   Family History:  Family History  Problem Relation Age of Onset  . Breast cancer Mother    Family Psychiatric  History: Unknown Social History:  History  Alcohol Use  . Yes    Comment: 1/5th Vodka every day      History  Drug Use No    Comment: Hx: herion, cocaine 5 yrs ago    Social History   Social History  . Marital Status: Legally Separated    Spouse Name: N/A  . Number of Children: N/A  . Years of Education: N/A   Social History Main Topics  . Smoking status: Current Every Day Smoker -- 1.00 packs/day for 20 years    Types: Cigarettes  . Smokeless tobacco: Never Used  . Alcohol Use: Yes     Comment: 1/5th Vodka every day   . Drug Use: No     Comment: Hx: herion, cocaine 5 yrs ago  . Sexual Activity: Yes    Birth Control/ Protection: Post-menopausal   Other Topics Concern  . None   Social History Narrative   Additional Social History:    Allergies:  No Known Allergies  Labs:  Results for orders placed or performed during the hospital encounter of 04/20/16 (from the  past 48 hour(s))  CBC     Status: Abnormal   Collection Time: 05/08/16  3:10 AM  Result Value Ref Range   WBC 8.6 4.0 - 10.5 K/uL   RBC 3.21 (L) 3.87 - 5.11 MIL/uL   Hemoglobin 8.9 (L) 12.0 - 15.0 g/dL   HCT 28.5 (L) 36.0 - 46.0 %   MCV 88.8 78.0 - 100.0 fL   MCH 27.7 26.0 - 34.0 pg   MCHC 31.2 30.0 - 36.0 g/dL   RDW 18.2 (H) 11.5 - 15.5 %   Platelets 514 (H) 150 - 400 K/uL  Magnesium     Status: None   Collection Time: 05/08/16  3:10 AM  Result Value Ref Range   Magnesium 1.9 1.7 - 2.4 mg/dL  Comprehensive metabolic panel     Status: Abnormal   Collection Time: 05/08/16  3:10 AM  Result Value Ref Range   Sodium 130 (L) 135 - 145 mmol/L   Potassium 3.7 3.5 - 5.1 mmol/L   Chloride 95 (L) 101  - 111 mmol/L   CO2 27 22 - 32 mmol/L   Glucose, Bld 139 (H) 65 - 99 mg/dL   BUN 19 6 - 20 mg/dL   Creatinine, Ser 0.64 0.44 - 1.00 mg/dL   Calcium 8.8 (L) 8.9 - 10.3 mg/dL   Total Protein 7.2 6.5 - 8.1 g/dL   Albumin 3.4 (L) 3.5 - 5.0 g/dL   AST 58 (H) 15 - 41 U/L   ALT 60 (H) 14 - 54 U/L   Alkaline Phosphatase 60 38 - 126 U/L   Total Bilirubin 0.3 0.3 - 1.2 mg/dL   GFR calc non Af Amer >60 >60 mL/min   GFR calc Af Amer >60 >60 mL/min    Comment: (NOTE) The eGFR has been calculated using the CKD EPI equation. This calculation has not been validated in all clinical situations. eGFR's persistently <60 mL/min signify possible Chronic Kidney Disease.    Anion gap 8 5 - 15  Phosphorus     Status: Abnormal   Collection Time: 05/08/16  3:10 AM  Result Value Ref Range   Phosphorus 5.8 (H) 2.5 - 4.6 mg/dL    Current Facility-Administered Medications  Medication Dose Route Frequency Provider Last Rate Last Dose  . acetaminophen (TYLENOL) tablet 650 mg  650 mg Oral Q6H PRN Erick Colace, NP   650 mg at 05/09/16 1318  . arformoterol (BROVANA) nebulizer solution 15 mcg  15 mcg Nebulization BID Erick Colace, NP   15 mcg at 05/09/16 0804  . benztropine (COGENTIN) tablet 0.5 mg  0.5 mg Oral BID Erick Colace, NP   0.5 mg at 05/09/16 5784  . budesonide (PULMICORT) nebulizer solution 0.5 mg  0.5 mg Nebulization BID Erick Colace, NP   0.5 mg at 05/09/16 0804  . chlordiazePOXIDE (LIBRIUM) capsule 25 mg  25 mg Oral TID Jonetta Osgood, MD      . DULoxetine (CYMBALTA) DR capsule 30 mg  30 mg Oral Daily Erick Colace, NP   30 mg at 05/09/16 6962  . enoxaparin (LOVENOX) injection 40 mg  40 mg Subcutaneous Q24H Jonetta Osgood, MD   40 mg at 05/09/16 1318  . feeding supplement (ENSURE ENLIVE) (ENSURE ENLIVE) liquid 237 mL  237 mL Oral BID BM Verlee Monte, MD   237 mL at 95/28/41 3244  . folic acid (FOLVITE) tablet 1 mg  1 mg Oral Daily Verlee Monte, MD   1 mg at 05/09/16 0102  .  gabapentin (NEURONTIN) capsule 300 mg  300 mg Oral TID Erick Colace, NP   300 mg at 05/09/16 4166  . gi cocktail (Maalox,Lidocaine,Donnatal)  30 mL Oral TID PRN Verlee Monte, MD   30 mL at 05/07/16 2040  . guaiFENesin-dextromethorphan (ROBITUSSIN DM) 100-10 MG/5ML syrup 5 mL  5 mL Oral Q4H PRN Hosie Poisson, MD   5 mL at 05/05/16 2348  . haloperidol (HALDOL) tablet 2 mg  2 mg Oral TID Erick Colace, NP   2 mg at 05/09/16 0630  . haloperidol lactate (HALDOL) injection 5 mg  5 mg Intravenous Q6H PRN Hosie Poisson, MD   5 mg at 05/09/16 1601  . haloperidol lactate (HALDOL) injection 5 mg  5 mg Intramuscular Q6H PRN Jonetta Osgood, MD   5 mg at 05/09/16 1240  . LORazepam (ATIVAN) injection 2 mg  2 mg Intravenous Q6H PRN Jonetta Osgood, MD   2 mg at 05/09/16 1306  . menthol-cetylpyridinium (CEPACOL) lozenge 3 mg  1 lozenge Oral PRN Hosie Poisson, MD      . metoprolol (LOPRESSOR) injection 10 mg  10 mg Intravenous Q6H PRN Verlee Monte, MD      . multivitamin with minerals tablet 1 tablet  1 tablet Oral Daily Verlee Monte, MD   1 tablet at 05/09/16 0921  . nicotine (NICODERM CQ - dosed in mg/24 hours) patch 21 mg  21 mg Transdermal Daily Gardiner Barefoot, NP   21 mg at 05/09/16 0932  . ondansetron (ZOFRAN) tablet 4 mg  4 mg Oral Q6H PRN Verlee Monte, MD       Or  . ondansetron (ZOFRAN) injection 4 mg  4 mg Intravenous Q6H PRN Verlee Monte, MD   4 mg at 05/05/16 0140  . pantoprazole (PROTONIX) EC tablet 40 mg  40 mg Oral Daily Jonetta Osgood, MD   40 mg at 05/09/16 3557  . polyethylene glycol (MIRALAX / GLYCOLAX) packet 17 g  17 g Oral Daily PRN Gardiner Barefoot, NP   17 g at 05/07/16 2146  . senna-docusate (Senokot-S) tablet 2 tablet  2 tablet Oral QHS PRN Gardiner Barefoot, NP   2 tablet at 05/07/16 2103  . sodium chloride flush (NS) 0.9 % injection 10-40 mL  10-40 mL Intracatheter PRN Annita Brod, MD   20 mL at 05/02/16 0430  . sodium chloride flush (NS) 0.9 % injection 10-40  mL  10-40 mL Intracatheter Q12H Annita Brod, MD   10 mL at 05/09/16 3220  . sodium chloride flush (NS) 0.9 % injection 3 mL  3 mL Intravenous Q12H Verlee Monte, MD   3 mL at 05/08/16 2200  . thiamine (VITAMIN B-1) tablet 100 mg  100 mg Oral Daily Verlee Monte, MD   100 mg at 05/09/16 2542    Musculoskeletal: Strength & Muscle Tone: decreased Gait & Station: unable to stand Patient leans: N/A  Psychiatric Specialty Exam: ROS   Blood pressure 98/70, pulse 85, temperature 98.2 F (36.8 C), temperature source Oral, resp. rate 18, height 5' (1.524 m), weight 54 kg (119 lb 0.8 oz), SpO2 97 %.Body mass index is 23.25 kg/(m^2).  General Appearance: Casual  Eye Contact::  Fair  Speech:  Clear and Coherent  Volume:  Normal  Mood:  Irritable  Affect:  Labile  Thought Process:  Coherent and Loose  Orientation:  Full (Time, Place, and Person)  Thought Content:  WDL  Suicidal Thoughts:  No  Homicidal Thoughts:  No  Memory:  Immediate;   Good Recent;   Good  Judgement:  Fair  Insight:  Fair  Psychomotor Activity:  Normal  Concentration:  Fair  Recall:  Good  Fund of Knowledge:Fair  Language: Fair  Akathisia:  Negative  Handed:  Right  AIMS (if indicated):     Assets:  Communication Skills Desire for Improvement Financial Resources/Insurance Leisure Time Resilience Social Support Transportation  ADL's:  Intact  Cognition: WNL  Sleep:      Treatment Plan Summary: Patient has delirium with intermittent agitation and aggressive behavior.  Case discussed with the Dr. Sloan Leiter We start Librium until 5 mg 2 times daily for possible alcohol/benzodiazepine withdrawals Change Haldol 5 mg 2 times daily for agitation and aggressive behavior Continue Cogentin 1 mg mg 2 times daily  for EPS  Discontinue Cymbalta 30 mg daily as patient has no symptoms of depression Increase Neurontin 400 mg 3 times daily for mood swings  Patient does meet criteria for acute psychiatric  hospitalization. Patient will be transferred to psychiatric hospitalization when medically stable.  Disposition: Refer to the unit social service for in patient medication management as patient continued to be intermittently delirious and has agitation and aggressive behaviors.  Patient does not meet criteria for psychiatric inpatient admission. Supportive therapy provided about ongoing stressors.  Durward Parcel., MD 05/09/2016 3:18 PM

## 2016-05-09 NOTE — Progress Notes (Signed)
PROGRESS NOTE        PATIENT DETAILS Name: Rhonda Mitchell Age: 55 y.o. Sex: female Date of Birth: 1961-10-14 Admit Date: 04/20/2016 Admitting Physician Clydia Llano, MD PCP:No primary care provider on file.  Brief Narrative: Patient is a 55 y.o. female admitted on 5/14 delirium tremens/alcohol withdrawal requiring ICU transfer for Precedex infusion, although her DTs have resolved, she continues to have delirium at times. She continues to have delusional/delirious behavior, and has been using profanity, hitting nursing staff.  Subjective: Did well yesterday afternoon off restrains-but again became combative/agressive requiring restrains last night. This am, she asked me what was going on at the "excavation site" that she was at earlier. We have a long talk and explained that we will observer her again without restraints-and that if she was calm,cooperative-we will think about her going home after psych re-evaluation. However earlier this afternoon-she again became aggressive and started abusing/hitting nursing staff. She also is very impulsive  Assessment/Plan: Principal Problem: Alcohol withdrawal with delirium tremens: Resolved. Required precedex infusion and Ativan per protocol.  Active Problems: Delusional/delirium with aggressive behavior: not sure if this is related to alcohol withdrawal-or related to underlying history of depression with psychosis.She also has been on prn Ativan-but has received it consistently multiple times a day-so not sure if we are also dealing with some Benzo withdrawal. She seems to have ongoing delirium and some hallucinations. Spoke with Dr Elsie Saas, he advised we start Librium, and continue  Haldol, Cymbalta and Neurontin. IVC remains in place. Unfortunately not safe for discharge at this point given persistent delirium/hallucinations, Dr Elsie Saas advises continued monitoring over the weekend. He will continue to follow and  provide further recommendations.   Alcoholic hepatitis: LFTs have almost normalized with supportive care. Follow periodically  Anemia: Normocytic-suspected related to underlying chronic disease and hep C. Follow periodically  History of chronic hepatitis C: Stable for outpatient follow-up.  COPD: Lungs clear, continue bronchodilators.  DVT Prophylaxis: Prophylactic Lovenox   Code Status: Full code   Family Communication: None at bedside  Disposition Plan: Remain inpatient-home when cleared by psychiatry  Antimicrobial agents: None  Procedures: None  CONSULTS:  pulmonary/intensive care and psychiatry  Time spent: 25 minutes-Greater than 50% of this time was spent in counseling, explanation of diagnosis, planning of further management, and coordination of care.  MEDICATIONS: Anti-infectives    None      Scheduled Meds: . arformoterol  15 mcg Nebulization BID  . benztropine  0.5 mg Oral BID  . budesonide (PULMICORT) nebulizer solution  0.5 mg Nebulization BID  . chlordiazePOXIDE  25 mg Oral TID  . DULoxetine  30 mg Oral Daily  . enoxaparin (LOVENOX) injection  40 mg Subcutaneous Q24H  . feeding supplement (ENSURE ENLIVE)  237 mL Oral BID BM  . folic acid  1 mg Oral Daily  . gabapentin  300 mg Oral TID  . haloperidol  2 mg Oral TID  . multivitamin with minerals  1 tablet Oral Daily  . nicotine  21 mg Transdermal Daily  . pantoprazole  40 mg Oral Daily  . sodium chloride flush  10-40 mL Intracatheter Q12H  . sodium chloride flush  3 mL Intravenous Q12H  . thiamine  100 mg Oral Daily   Continuous Infusions:  PRN Meds:.acetaminophen, gi cocktail, guaiFENesin-dextromethorphan, haloperidol lactate, haloperidol lactate, LORazepam, menthol-cetylpyridinium, metoprolol, ondansetron **OR** ondansetron (ZOFRAN) IV, polyethylene glycol, senna-docusate,  sodium chloride flush   PHYSICAL EXAM: Vital signs: Filed Vitals:   05/09/16 0040 05/09/16 0630 05/09/16 0845  05/09/16 1318  BP: 106/45 115/65 114/64 98/70  Pulse:  68 62 85  Temp: 97.5 F (36.4 C)  97.6 F (36.4 C) 98.2 F (36.8 C)  TempSrc: Axillary  Axillary   Resp:   18   Height:      Weight:      SpO2:   96% 97%   Filed Weights   04/20/16 1815 04/25/16 0500  Weight: 52.6 kg (115 lb 15.4 oz) 54 kg (119 lb 0.8 oz)   Body mass index is 23.25 kg/(m^2).   Gen Exam: Awake alert-has some pressure like speech-follows commands.  Neck: Supple, No JVD.   Chest: B/L Clear.   CVS: S1 S2 Regular Abdomen: soft, BS +, non tender, non distended.  Extremities: no edema, lower extremities warm to touch. Neurologic: Moves all 4 ext Skin: No Rash or lesions   Wounds: N/A.    LABORATORY DATA: CBC:  Recent Labs Lab 05/04/16 1330 05/08/16 0310  WBC 9.2 8.6  HGB 9.2* 8.9*  HCT 30.1* 28.5*  MCV 93.2 88.8  PLT 620* 514*    Basic Metabolic Panel:  Recent Labs Lab 05/08/16 0310  NA 130*  K 3.7  CL 95*  CO2 27  GLUCOSE 139*  BUN 19  CREATININE 0.64  CALCIUM 8.8*  MG 1.9  PHOS 5.8*    GFR: Estimated Creatinine Clearance: 57.1 mL/min (by C-G formula based on Cr of 0.64).  Liver Function Tests:  Recent Labs Lab 05/08/16 0310  AST 58*  ALT 60*  ALKPHOS 60  BILITOT 0.3  PROT 7.2  ALBUMIN 3.4*   No results for input(s): LIPASE, AMYLASE in the last 168 hours. No results for input(s): AMMONIA in the last 168 hours.  Coagulation Profile: No results for input(s): INR, PROTIME in the last 168 hours.  Cardiac Enzymes: No results for input(s): CKTOTAL, CKMB, CKMBINDEX, TROPONINI in the last 168 hours.  BNP (last 3 results) No results for input(s): PROBNP in the last 8760 hours.  HbA1C: No results for input(s): HGBA1C in the last 72 hours.  CBG: No results for input(s): GLUCAP in the last 168 hours.  Lipid Profile: No results for input(s): CHOL, HDL, LDLCALC, TRIG, CHOLHDL, LDLDIRECT in the last 72 hours.  Thyroid Function Tests: No results for input(s): TSH,  T4TOTAL, FREET4, T3FREE, THYROIDAB in the last 72 hours.  Anemia Panel: No results for input(s): VITAMINB12, FOLATE, FERRITIN, TIBC, IRON, RETICCTPCT in the last 72 hours.  Urine analysis:    Component Value Date/Time   COLORURINE YELLOW 04/20/2016 1741   APPEARANCEUR CLEAR 04/20/2016 1741   LABSPEC 1.019 04/20/2016 1741   PHURINE 6.0 04/20/2016 1741   GLUCOSEU NEGATIVE 04/20/2016 1741   HGBUR NEGATIVE 04/20/2016 1741   BILIRUBINUR NEGATIVE 04/20/2016 1741   KETONESUR >80* 04/20/2016 1741   PROTEINUR NEGATIVE 04/20/2016 1741   UROBILINOGEN 1.0 08/14/2015 1140   NITRITE NEGATIVE 04/20/2016 1741   LEUKOCYTESUR NEGATIVE 04/20/2016 1741    Sepsis Labs: Lactic Acid, Venous No results found for: LATICACIDVEN  MICROBIOLOGY: No results found for this or any previous visit (from the past 240 hour(s)).  RADIOLOGY STUDIES/RESULTS: Dg Chest 2 View  04/20/2016  CLINICAL DATA:  Chest pain and shortness of breath, chronic EXAM: CHEST  2 VIEW COMPARISON:  February 19, 2016 FINDINGS: There is mild scarring in the lingula. Lungs elsewhere are clear. The heart size and pulmonary vascularity are normal. No adenopathy. No  pneumothorax. No bone lesions. IMPRESSION: No edema or consolidation.  Slight scarring in the lingula. Electronically Signed   By: Bretta BangWilliam  Woodruff III M.D.   On: 04/20/2016 13:37   Dg Chest Port 1 View  05/04/2016  CLINICAL DATA:  Hypoxia.  COPD.  History of smoking. EXAM: PORTABLE CHEST 1 VIEW COMPARISON:  04/20/2016 FINDINGS: Cardiac silhouette is normal in size and configuration. Normal mediastinal and hilar contours. Clear lungs.  No pleural effusion or pneumothorax. Right PICC is new from the prior study. Tip lies in the lower superior vena cava. Bony thorax is intact. IMPRESSION: No active disease. Electronically Signed   By: Amie Portlandavid  Ormond M.D.   On: 05/04/2016 15:07     LOS: 18 days   Jeoffrey MassedGHIMIRE,Elbert Polyakov, MD  [facility] Pager:[phone]  If 7PM-7AM, please contact  night-coverage [email or web address] Password [email or web address] 05/09/2016, 1:59 PM

## 2016-05-10 MED ORDER — LORAZEPAM 1 MG PO TABS
2.0000 mg | ORAL_TABLET | Freq: Four times a day (QID) | ORAL | Status: DC | PRN
  Administered 2016-05-10 – 2016-05-12 (×6): 2 mg via ORAL
  Filled 2016-05-10 (×6): qty 2

## 2016-05-10 MED ORDER — ALBUTEROL SULFATE (2.5 MG/3ML) 0.083% IN NEBU
2.5000 mg | INHALATION_SOLUTION | RESPIRATORY_TRACT | Status: DC | PRN
Start: 2016-05-10 — End: 2016-05-12

## 2016-05-10 MED ORDER — LORAZEPAM 2 MG/ML IJ SOLN
2.0000 mg | Freq: Four times a day (QID) | INTRAMUSCULAR | Status: DC | PRN
Start: 2016-05-10 — End: 2016-05-12

## 2016-05-10 MED ORDER — METOPROLOL TARTRATE 5 MG/5ML IV SOLN: 5.0000 mg | Freq: Four times a day (QID) | INTRAVENOUS | Status: DC | PRN

## 2016-05-10 MED ORDER — CHLORDIAZEPOXIDE HCL 25 MG PO CAPS
25.0000 mg | ORAL_CAPSULE | Freq: Three times a day (TID) | ORAL | Status: DC
  Administered 2016-05-10 – 2016-05-12 (×7): 25 mg via ORAL
  Filled 2016-05-10 (×7): qty 1

## 2016-05-10 NOTE — Progress Notes (Signed)
Patient up in chair today, ambulated around unit numerous times with walker.  Needs prompting to walk slower and keep walker in front of her. Appetite good today.  Remains impulsive and uses foul language with most interactions with staff.

## 2016-05-10 NOTE — Progress Notes (Addendum)
LCSW has reviewed chart and aware patient is now remaining in medical acute ICU as she is not stable for transfer. Patient is remaining under IVC (which expires on 05/13/16)  [facility] but denied due to not being medically stable. Psych MD re-consulted and patient to be recommended for [facility] again at this time.  [facility] until 05/12/16 LCSW has re-sent [facility] referral after speaking with MD of medical clearance at this time. [facility] and if patient appropriate to be placed on wait list. Phone assessment completed for referral. Referral is under review.  Unit CSW to follow up on Monday.  Deretha EmoryHannah Verbie Babic LCSW, MSW Clinical Social Work: [employer] [phone]

## 2016-05-10 NOTE — Progress Notes (Signed)
After awakening at 0500, patient became extremely agitated and combative. Patient cursing at, hitting, and kicking staff, despite soft wrist restraints and posey belt.  2mg  IV ativan administered at 0521. 5mg  IV haldol administered at 0542.  Patient continued to physically and verbally abuse staff. [facility] paged. Orders for soft ankle restraints given. Patient currently in bed, cursing at staff.

## 2016-05-10 NOTE — Progress Notes (Signed)
PROGRESS NOTE        PATIENT DETAILS Name: Rhonda Mitchell Age: 55 y.o. Sex: female Date of Birth: September 09, 1961 Admit Date: 04/20/2016 Admitting Physician Clydia Llano, MD PCP:No primary care provider on file.  Brief Narrative: Patient is a 55 y.o. female admitted on 5/14 delirium tremens/alcohol withdrawal requiring ICU transfer for Precedex infusion, although her DTs have resolved, she continues to have delirium at times. She continues to have delusional/delirious behavior, and has been using profanity, hitting nursing staff.  Subjective: Seeing a dabble grey horse outside her room this morning, also has seen a fireplace in the adjoining room.  Assessment/Plan: Principal Problem: Alcohol withdrawal with delirium tremens: Resolved. Required precedex infusion and Ativan per protocol.  Active Problems: Delusional/delirium with aggressive behavior:doubt that this is related to alcohol withdrawal, suspect more related to her  underlying history of depression with psychosis, and probably exacerbated by benzo withdrawal-as she also has been on prn Ativan-but has received it consistently multiple times a day. She continues to have hallucinations-mostly visual this morning. Continue librium, haldol, cymbalta and neurontin.Psych now recommending transfer to inpatient psych-she is medically stable at this point to transfer were a bed to become available. IVC remains in place.  Alcoholic hepatitis: LFTs have almost normalized with supportive care. Follow periodically  Anemia: Normocytic-suspected related to underlying chronic disease and hep C. Follow periodically  History of chronic hepatitis C: Stable for outpatient follow-up.  COPD: Lungs clear, continue bronchodilators.  DVT Prophylaxis: Prophylactic Lovenox   Code Status: Full code   Family Communication: None at bedside  Disposition Plan: Remain inpatient-inpatient psych when bed  available.  Antimicrobial agents: None  Procedures: None  CONSULTS:  pulmonary/intensive care and psychiatry  Time spent: 25 minutes-Greater than 50% of this time was spent in counseling, explanation of diagnosis, planning of further management, and coordination of care.  MEDICATIONS: Anti-infectives    None      Scheduled Meds: . arformoterol  15 mcg Nebulization BID  . benztropine  1 mg Oral BID  . budesonide (PULMICORT) nebulizer solution  0.5 mg Nebulization BID  . chlordiazePOXIDE  25 mg Oral TID  . enoxaparin (LOVENOX) injection  40 mg Subcutaneous Q24H  . feeding supplement (ENSURE ENLIVE)  237 mL Oral BID BM  . folic acid  1 mg Oral Daily  . gabapentin  400 mg Oral TID  . haloperidol  5 mg Oral BID  . multivitamin with minerals  1 tablet Oral Daily  . nicotine  21 mg Transdermal Daily  . pantoprazole  40 mg Oral Daily  . sodium chloride flush  10-40 mL Intracatheter Q12H  . sodium chloride flush  3 mL Intravenous Q12H  . thiamine  100 mg Oral Daily   Continuous Infusions:  PRN Meds:.acetaminophen, gi cocktail, guaiFENesin-dextromethorphan, haloperidol lactate, haloperidol lactate, LORazepam, menthol-cetylpyridinium, metoprolol, ondansetron **OR** ondansetron (ZOFRAN) IV, polyethylene glycol, senna-docusate, sodium chloride flush   PHYSICAL EXAM: Vital signs: Filed Vitals:   05/09/16 2306 05/10/16 0441 05/10/16 0738 05/10/16 0739  BP: 130/69 118/71 126/68   Pulse: 68 68  66  Temp: 98.4 F (36.9 C)     TempSrc: Oral     Resp: 16 16    Height:      Weight:      SpO2: 94% 95%  100%   Filed Weights   04/20/16 1815 04/25/16 0500  Weight: 52.6 kg (115 lb  15.4 oz) 54 kg (119 lb 0.8 oz)   Body mass index is 23.25 kg/(m^2).   Gen Exam: Awake alert-has some pressure like speech-follows commands.  Neck: Supple, No JVD.   Chest: B/L Clear.   CVS: S1 S2 Regular Abdomen: soft, BS +, non tender, non distended.  Extremities: no edema, lower extremities warm to  touch. Neurologic: Moves all 4 ext Skin: No Rash or lesions   Wounds: N/A.    LABORATORY DATA: CBC:  Recent Labs Lab 05/04/16 1330 05/08/16 0310  WBC 9.2 8.6  HGB 9.2* 8.9*  HCT 30.1* 28.5*  MCV 93.2 88.8  PLT 620* 514*    Basic Metabolic Panel:  Recent Labs Lab 05/08/16 0310  NA 130*  K 3.7  CL 95*  CO2 27  GLUCOSE 139*  BUN 19  CREATININE 0.64  CALCIUM 8.8*  MG 1.9  PHOS 5.8*    GFR: Estimated Creatinine Clearance: 57.1 mL/min (by C-G formula based on Cr of 0.64).  Liver Function Tests:  Recent Labs Lab 05/08/16 0310  AST 58*  ALT 60*  ALKPHOS 60  BILITOT 0.3  PROT 7.2  ALBUMIN 3.4*   No results for input(s): LIPASE, AMYLASE in the last 168 hours. No results for input(s): AMMONIA in the last 168 hours.  Coagulation Profile: No results for input(s): INR, PROTIME in the last 168 hours.  Cardiac Enzymes: No results for input(s): CKTOTAL, CKMB, CKMBINDEX, TROPONINI in the last 168 hours.  BNP (last 3 results) No results for input(s): PROBNP in the last 8760 hours.  HbA1C: No results for input(s): HGBA1C in the last 72 hours.  CBG: No results for input(s): GLUCAP in the last 168 hours.  Lipid Profile: No results for input(s): CHOL, HDL, LDLCALC, TRIG, CHOLHDL, LDLDIRECT in the last 72 hours.  Thyroid Function Tests: No results for input(s): TSH, T4TOTAL, FREET4, T3FREE, THYROIDAB in the last 72 hours.  Anemia Panel: No results for input(s): VITAMINB12, FOLATE, FERRITIN, TIBC, IRON, RETICCTPCT in the last 72 hours.  Urine analysis:    Component Value Date/Time   COLORURINE YELLOW 04/20/2016 1741   APPEARANCEUR CLEAR 04/20/2016 1741   LABSPEC 1.019 04/20/2016 1741   PHURINE 6.0 04/20/2016 1741   GLUCOSEU NEGATIVE 04/20/2016 1741   HGBUR NEGATIVE 04/20/2016 1741   BILIRUBINUR NEGATIVE 04/20/2016 1741   KETONESUR >80* 04/20/2016 1741   PROTEINUR NEGATIVE 04/20/2016 1741   UROBILINOGEN 1.0 08/14/2015 1140   NITRITE NEGATIVE 04/20/2016  1741   LEUKOCYTESUR NEGATIVE 04/20/2016 1741    Sepsis Labs: Lactic Acid, Venous No results found for: LATICACIDVEN  MICROBIOLOGY: No results found for this or any previous visit (from the past 240 hour(s)).  RADIOLOGY STUDIES/RESULTS: Dg Chest 2 View  04/20/2016  CLINICAL DATA:  Chest pain and shortness of breath, chronic EXAM: CHEST  2 VIEW COMPARISON:  February 19, 2016 FINDINGS: There is mild scarring in the lingula. Lungs elsewhere are clear. The heart size and pulmonary vascularity are normal. No adenopathy. No pneumothorax. No bone lesions. IMPRESSION: No edema or consolidation.  Slight scarring in the lingula. Electronically Signed   By: Bretta Bang III M.D.   On: 04/20/2016 13:37   Dg Chest Port 1 View  05/04/2016  CLINICAL DATA:  Hypoxia.  COPD.  History of smoking. EXAM: PORTABLE CHEST 1 VIEW COMPARISON:  04/20/2016 FINDINGS: Cardiac silhouette is normal in size and configuration. Normal mediastinal and hilar contours. Clear lungs.  No pleural effusion or pneumothorax. Right PICC is new from the prior study. Tip lies in the lower superior  vena cava. Bony thorax is intact. IMPRESSION: No active disease. Electronically Signed   By: Amie Portlandavid  Ormond M.D.   On: 05/04/2016 15:07     LOS: 19 days   Jeoffrey MassedGHIMIRE,SHANKER, MD  [facility] Pager:[phone]  If 7PM-7AM, please contact night-coverage [email or web address] Password [email or web address] 05/10/2016, 7:53 AM

## 2016-05-10 NOTE — Progress Notes (Signed)
Patient woke up from sleep at 2130 and started to walking in the hallway looking for a way out. She stated she is looking for her father. Patient had multiple falls this admit. Patient escorted back to room and applied 4 point restraints and posey belt with help of security and AC.  MD notified, orders received.  Safety sitter at bedside. Continued to monitor

## 2016-05-11 DIAGNOSIS — F23 Brief psychotic disorder: Secondary | ICD-10-CM

## 2016-05-11 NOTE — Progress Notes (Addendum)
UPDATE:  [facility] denied patient. Reported no admission at this time due to: IV Ativan  Continues to have delirium  [facility].    [facility]WA called [facility], patient admission is currently in pending status for inpatient bed.  Will continue to  follow-up with [facility].   Vivi BarrackNicole Bran Aldridge, Theresia MajorsLCSWA, MSW Clinical Social Worker 5E and Psychiatric Service Line [phone] 05/11/2016  10:30 AM

## 2016-05-11 NOTE — Consult Note (Signed)
[facility] Face-to-Face Psychiatry Consult Follow Up  Reason for Consult:  Alcohol intoxication and withdrawal and depression Referring Physician:  Dr. Karleen Hampshire Patient Identification: Rhonda Mitchell MRN:  [id number] Principal Diagnosis: Alcohol withdrawal [facility]) Diagnosis:   Patient Active Problem List   Diagnosis Date Noted  . Acute encephalopathy [G93.40]   . Centrilobular emphysema ([place]) [J43.2]   . Encephalopathy acute [G93.40] 04/23/2016  . Alcohol dependence with alcohol-induced mood disorder ([place]) [F10.24] 03/05/2016  . Abnormality of gait [R26.9]   . Lower extremity weakness [R29.898]   . Pressure ulcer [L89.90] 02/26/2016  . Tachypnea [R06.82]   . Depression [F32.9]   . Hypernatremia [E87.0] 02/20/2016  . Anemia [D64.9] 02/20/2016  . UTI (lower urinary tract infection) [N39.0]   . Abdominal pain, vomiting, and diarrhea [R10.9, R11.10, R19.7] 12/18/2015  . Alcohol abuse [F10.10] 12/18/2015  . Abdominal pain [R10.9] 12/18/2015  . Elevated blood pressure [R03.0] 12/18/2015  . Severe recurrent major depression without psychotic features ([place]) [F33.2] 10/28/2015  . Alcohol dependence with uncomplicated withdrawal ([place]) [F10.230] 10/27/2015  . Alcohol-induced mood disorder ([place]) [F10.94] 10/27/2015  . Psychosis [F29] 08/21/2015  . Hypomagnesemia [E83.42]   . Protein-calorie malnutrition ([place]) [E46]   . Hepatitis C antibody test positive [R89.4]   . Elevated LFTs [R79.89]   . Hyperkalemia [E87.5]   . Severe protein-calorie malnutrition ([place]) [E43]   . Acute respiratory failure with hypoxia ([place]) [J96.01] 08/03/2015  . Septic olecranon bursitis of right elbow [M70.21, B96.89] 05/08/2015  . Septic bursitis of elbow [M71.129] 05/07/2015  . Thrombocytopenia ([place]) [D69.6] 05/07/2015  . Chronic hepatitis C without hepatic coma ([place]) [B18.2] 05/07/2015  . Hypokalemia [E87.6] 05/07/2015  . Abscess of bursa of elbow [M71.029] 05/07/2015  . COPD exacerbation ([place]) [J44.1] 03/18/2015  .  Hyponatremia [E87.1] 03/18/2015  . Hyperglycemia [R73.9] 03/18/2015  . Alcohol dependence ([place]) [F10.20] 12/07/2014  . Alcohol withdrawal ([place]) [F10.239] 12/07/2014  . Protein-calorie malnutrition, severe ([place]) [E43] 12/07/2014  . Musculoskeletal chest pain [R07.89] 12/07/2014  . Tobacco abuse [Z72.0] 12/07/2014  . Carbuncle and furuncle [L02.92, L02.93] 12/07/2014  . Macrocytic anemia [D53.9] 12/07/2014  . Chest pain [R07.9] 11/30/2014  . Tachycardia [R00.0] 11/30/2014  . COPD (chronic obstructive pulmonary disease) ([place]) [J44.9] 11/30/2014  . Cellulitis and abscess [L03.90, L02.91] 11/30/2014  . Atypical chest pain [R07.89]   . Dehydration [E86.0]     Total Time spent with patient: 20 minutes  Subjective:   Rhonda Mitchell is a 55 y.o. female patient admitted with abdominal pain, nausea and withdrawal shakes.  HPI:  Rhonda Mitchell is a 55 y.o. female, seen, chart reviewed for the face-to-face psychiatric consultation and evaluation increased agitation and alcohol withdrawal symptoms. Patient is a poor historian and seems to be confused and could not provide linear and goal-directed history even after 10 days being in hospital and seems like it medically stable. Patient is psychiatrically not stable to continue to be irritable, as stated and cursing and yelling at staff members. Review of medical records indicated that patient presented to the emergency department with the epigastric abdominal pain, nausea and withdrawal shakes. Patient is known to the psychiatric services from her multiple hospital encounter some admissions for alcohol withdrawal symptoms and detox treatment. Patient reportedly stopped drinking alcohol a few days ago and seems to be confused does not even know when she came to the hospital. Patient is noncompliant with medical advises, showed up to the emergency department 8 times in the past 6 months of 4 time she was admitted. She was recently in the  hospital from March  1 till March 20 for alcohol detoxification which required drug-induced coma. Past Psychiatric History: She has multiple hospital admission for alcohol detox treatments.  Interval History: Patient seen for psychiatric consultation follow-up today, and case discussed with the staff RN. Patient appeared lying in her bed, calm and cooperative and requesting to be discharged to home with her husband. Staff RN reported patient has been behaving good since this morning but also elevated of intermittent explosive outbursts. Patient promises she can behave well for the next 24 hours and can be reassessed for appropriate treatment plan. Patient has intact cognitions including orientation, concentration and memory functions. Patient seems to be having the need to attitude and impulsive behaviors at her baseline. Patient hospitalization is complicated with intermittent agitation and aggressive behaviors. Patient was informed she needs to behave at least 24 hours before going home, patient agreed. Patient stated I'm not going to be nasty to anybody.   Risk to Self: Is patient at risk for suicide?: No Risk to Others:   Prior Inpatient Therapy:   Prior Outpatient Therapy:    Past Medical History:  Past Medical History  Diagnosis Date  . COPD (chronic obstructive pulmonary disease) (HCC)   . Anxiety   . Shortness of breath   . Arthritis   . GERD (gastroesophageal reflux disease)   . Full dentures   . Insomnia   . Alcohol abuse   . Hepatitis C   . Alcohol abuse   . History of MRSA infection   . History of encephalopathy     Past Surgical History  Procedure Laterality Date  . Arm surgery    . Hand surgery  1990    both rt/lt carpal tunnels  . Shoulder surgery      right and left-age [age]  . Orif ulnar / radial shaft fracture  1990    right-got infection-had total 10 surgeies 1990  . Orif ulnar fracture Left 05/23/2014    Procedure: OPEN REDUCTION INTERNAL FIXATION (ORIF) LEFT ULNAR FRACTURE;   Surgeon: Harvie JuniorJohn L Graves, MD;  Location: [facility];  Service: Orthopedics;  Laterality: Left;  . I&d extremity Right 05/07/2015    Procedure: IRRIGATION AND DEBRIDEMENT  ELBOW ;  Surgeon: Dominica SeverinWilliam Gramig, MD;  Location: [facility] OR;  Service: Orthopedics;  Laterality: Right;   Family History:  Family History  Problem Relation Age of Onset  . Breast cancer Mother    Family Psychiatric  History: Unknown Social History:  History  Alcohol Use  . Yes    Comment: 1/5th Vodka every day      History  Drug Use No    Comment: Hx: herion, cocaine 5 yrs ago    Social History   Social History  . Marital Status: Legally Separated    Spouse Name: N/A  . Number of Children: N/A  . Years of Education: N/A   Social History Main Topics  . Smoking status: Current Every Day Smoker -- 1.00 packs/day for 20 years    Types: Cigarettes  . Smokeless tobacco: Never Used  . Alcohol Use: Yes     Comment: 1/5th Vodka every day   . Drug Use: No     Comment: Hx: herion, cocaine 5 yrs ago  . Sexual Activity: Yes    Birth Control/ Protection: Post-menopausal   Other Topics Concern  . None   Social History Narrative   Additional Social History:    Allergies:  No Known Allergies  Labs:  No results found for  this or any previous visit (from the past 48 hour(s)).  Current Facility-Administered Medications  Medication Dose Route Frequency Provider Last Rate Last Dose  . acetaminophen (TYLENOL) tablet 650 mg  650 mg Oral Q6H PRN [name], NP   650 mg at 05/10/16 1810  . albuterol (PROVENTIL) (2.5 MG/3ML) 0.083% nebulizer solution 2.5 mg  2.5 mg Nebulization Q2H PRN Maretta Bees, MD      . arformoterol [facility]) nebulizer solution 15 mcg  15 mcg Nebulization BID [name], NP   15 mcg at 05/09/16 2233  . benztropine (COGENTIN) tablet 1 mg  1 mg Oral BID Leata Mouse, MD   1 mg at 05/11/16 0947  . budesonide (PULMICORT) nebulizer solution 0.5 mg  0.5 mg Nebulization  BID [name], NP   0.5 mg at 05/09/16 2232  . chlordiazePOXIDE (LIBRIUM) capsule 25 mg  25 mg Oral TID Maretta Bees, MD   25 mg at 05/11/16 0948  . enoxaparin (LOVENOX) injection 40 mg  40 mg Subcutaneous Q24H Maretta Bees, MD   40 mg at 05/10/16 1349  . feeding supplement (ENSURE ENLIVE) (ENSURE ENLIVE) liquid 237 mL  237 mL Oral BID BM Clydia Llano, MD   237 mL at 05/11/16 1003  . folic acid (FOLVITE) tablet 1 mg  1 mg Oral Daily Clydia Llano, MD   1 mg at 05/11/16 0949  . gabapentin (NEURONTIN) capsule 400 mg  400 mg Oral TID Leata Mouse, MD   400 mg at 05/11/16 0948  . gi cocktail (Maalox,Lidocaine,Donnatal)  30 mL Oral TID PRN Clydia Llano, MD   30 mL at 05/09/16 2315  . guaiFENesin-dextromethorphan (ROBITUSSIN DM) 100-10 MG/5ML syrup 5 mL  5 mL Oral Q4H PRN Kathlen Mody, MD   5 mL at 05/05/16 2348  . haloperidol (HALDOL) tablet 5 mg  5 mg Oral BID Leata Mouse, MD   5 mg at 05/11/16 0948  . haloperidol lactate (HALDOL) injection 5 mg  5 mg Intravenous Q6H PRN Kathlen Mody, MD   5 mg at 05/10/16 0542  . haloperidol lactate (HALDOL) injection 5 mg  5 mg Intramuscular Q6H PRN Maretta Bees, MD   5 mg at 05/09/16 1240  . LORazepam (ATIVAN) injection 2 mg  2 mg Intravenous Q6H PRN Maretta Bees, MD   2 mg at 05/10/16 0521  . LORazepam (ATIVAN) tablet 2 mg  2 mg Oral Q6H PRN Maretta Bees, MD   2 mg at 05/10/16 2357   Or  . LORazepam (ATIVAN) injection 2 mg  2 mg Intramuscular Q6H PRN Shanker Levora Dredge, MD      . menthol-cetylpyridinium (CEPACOL) lozenge 3 mg  1 lozenge Oral PRN Kathlen Mody, MD      . metoprolol (LOPRESSOR) injection 5 mg  5 mg Intravenous Q6H PRN Shanker Levora Dredge, MD      . multivitamin with minerals tablet 1 tablet  1 tablet Oral Daily Clydia Llano, MD   1 tablet at 05/11/16 0949  . nicotine (NICODERM CQ - dosed in mg/24 hours) patch 21 mg  21 mg Transdermal Daily Leda Gauze, NP   21 mg at 05/11/16 0949  .  ondansetron (ZOFRAN) tablet 4 mg  4 mg Oral Q6H PRN Clydia Llano, MD       Or  . ondansetron (ZOFRAN) injection 4 mg  4 mg Intravenous Q6H PRN Clydia Llano, MD   4 mg at 05/05/16 0140  . pantoprazole (PROTONIX) EC tablet 40 mg  40 mg Oral Daily Maretta Bees, MD   40 mg at 05/11/16 0949  . polyethylene glycol (MIRALAX / GLYCOLAX) packet 17 g  17 g Oral Daily PRN Leda Gauze, NP   17 g at 05/07/16 2146  . senna-docusate (Senokot-S) tablet 2 tablet  2 tablet Oral QHS PRN Leda Gauze, NP   2 tablet at 05/07/16 2103  . thiamine (VITAMIN B-1) tablet 100 mg  100 mg Oral Daily Clydia Llano, MD   100 mg at 05/11/16 1610    Musculoskeletal: Strength & Muscle Tone: decreased Gait & Station: unable to stand Patient leans: N/A  Psychiatric Specialty Exam: ROS   Blood pressure 125/63, pulse 85, temperature 98.1 F (36.7 C), temperature source Axillary, resp. rate 20, height 5' (1.524 m), weight 54 kg (119 lb 0.8 oz), SpO2 97 %.Body mass index is 23.25 kg/(m^2).  General Appearance: Casual  Eye Contact::  Fair  Speech:  Clear and Coherent  Volume:  Normal  Mood:  Irritable  Affect:  Labile  Thought Process:  Coherent and Loose  Orientation:  Full (Time, Place, and Person)  Thought Content:  WDL  Suicidal Thoughts:  No  Homicidal Thoughts:  No  Memory:  Immediate;   Good Recent;   Good  Judgement:  Fair  Insight:  Fair  Psychomotor Activity:  Normal  Concentration:  Fair  Recall:  Good  Fund of Knowledge:Fair  Language: Fair  Akathisia:  Negative  Handed:  Right  AIMS (if indicated):     Assets:  Communication Skills Desire for Improvement Financial Resources/Insurance Leisure Time Resilience Social Support Transportation  ADL's:  Intact  Cognition: WNL  Sleep:      Treatment Plan Summary: Patient has delirium with intermittent agitation and aggressive behavior.  Continue Librium 25 mg 3 times daily for possible alcohol/benzodiazepine withdrawals Continue  Haldol 5 mg 2 times daily for agitation and aggressive behavior Continue Cogentin 1 mg mg 2 times daily  for EPS  Continue Neurontin 400 mg 3 times daily for mood swings  Patient does meet criteria for acute psychiatric hospitalization unless she can show improvement in her irritability, agitation and aggressive behaviors during next 24 hours with the recently ingested medication.  Patient has been visited her in the hospital yesterday and seems to be supportive to her.   Disposition: Refer to the unit social service for as patient continued to be intermittently delirious with agitation and aggressive behaviors.  Patient does not meet criteria for psychiatric inpatient admission. Supportive therapy provided about ongoing stressors.  Nehemiah Settle., MD 05/11/2016 10:06 AM

## 2016-05-11 NOTE — Progress Notes (Signed)
PROGRESS NOTE        PATIENT DETAILS Name: Rhonda Mitchell Age: 55 y.o. Sex: female Date of Birth: 01/23/1961 Admit Date: 04/20/2016 Admitting Physician Clydia Llano, MD PCP:No primary care provider on file.  Brief Narrative: Patient is a 55 y.o. female admitted on 5/14 delirium tremens/alcohol withdrawal requiring ICU transfer for Precedex infusion, although her DTs have resolved, she continues to have delirium at times. She continues to have delusional/delirious behavior, and has been using profanity, hitting nursing staff.  Subjective: Tearful this am-seems more alert and awake this am.   Assessment/Plan: Principal Problem: Alcohol withdrawal with delirium tremens: Resolved. Her current mental status is not from alcohol withdrawal. Required precedex infusion and Ativan per protocol.  Active Problems: Depression/Psychoses:Continues to have delusions/hallucinations/aggressive behaviour requiring prn restraints. This is not related to alcohol withdrawal, suspect more related to her underlying history of depression with psychosis, and probably exacerbated by benzo withdrawal-as she also has been on prn Ativan-but has received it consistently multiple times a day. Continue librium, haldol, cymbalta and neurontin.Psych now recommending transfer to inpatient psych-she is medically stable at this point to transfer were a bed to become available. IVC remains in place.  Alcoholic hepatitis: LFTs have almost normalized with supportive care. Follow periodically  Anemia: Normocytic-suspected related to underlying chronic disease and hep C. Follow periodically  History of chronic hepatitis C: Stable for outpatient follow-up.  COPD: Lungs clear, continue bronchodilators.  DVT Prophylaxis: Prophylactic Lovenox   Code Status: Full code   Family Communication: None at bedside  Disposition Plan: Remain inpatient-ok to transfer to inpatient psych when bed  available.  Antimicrobial agents: None  Procedures: None  CONSULTS:  pulmonary/intensive care and psychiatry  Time spent: 25 minutes-Greater than 50% of this time was spent in counseling, explanation of diagnosis, planning of further management, and coordination of care.  MEDICATIONS: Anti-infectives    None      Scheduled Meds: . arformoterol  15 mcg Nebulization BID  . benztropine  1 mg Oral BID  . budesonide (PULMICORT) nebulizer solution  0.5 mg Nebulization BID  . chlordiazePOXIDE  25 mg Oral TID  . enoxaparin (LOVENOX) injection  40 mg Subcutaneous Q24H  . feeding supplement (ENSURE ENLIVE)  237 mL Oral BID BM  . folic acid  1 mg Oral Daily  . gabapentin  400 mg Oral TID  . haloperidol  5 mg Oral BID  . multivitamin with minerals  1 tablet Oral Daily  . nicotine  21 mg Transdermal Daily  . pantoprazole  40 mg Oral Daily  . thiamine  100 mg Oral Daily   Continuous Infusions:  PRN Meds:.acetaminophen, albuterol, gi cocktail, guaiFENesin-dextromethorphan, haloperidol lactate, haloperidol lactate, LORazepam, LORazepam **OR** LORazepam, menthol-cetylpyridinium, metoprolol, ondansetron **OR** ondansetron (ZOFRAN) IV, polyethylene glycol, senna-docusate   PHYSICAL EXAM: Vital signs: Filed Vitals:   05/10/16 1116 05/10/16 1117 05/11/16 0000 05/11/16 0635  BP: 128/101 146/88 144/84 125/63  Pulse: 83 85    Temp:   98.3 F (36.8 C) 98.1 F (36.7 C)  TempSrc:   Oral Axillary  Resp:   18 20  Height:      Weight:      SpO2: 97% 97%     Filed Weights   04/20/16 1815 04/25/16 0500  Weight: 52.6 kg (115 lb 15.4 oz) 54 kg (119 lb 0.8 oz)   Body mass index is 23.25 kg/(m^2).  Gen Exam: Awake alert-but tearful this am.  Neck: Supple, No JVD.   Chest: B/L Clear.   CVS: S1 S2 Regular Abdomen: soft, BS +, non tender, non distended.  Extremities: no edema, lower extremities warm to touch. Neurologic: Moves all 4 ext Skin: No Rash or lesions   Wounds: N/A.     LABORATORY DATA: CBC:  Recent Labs Lab 05/04/16 1330 05/08/16 0310  WBC 9.2 8.6  HGB 9.2* 8.9*  HCT 30.1* 28.5*  MCV 93.2 88.8  PLT 620* 514*    Basic Metabolic Panel:  Recent Labs Lab 05/08/16 0310  NA 130*  K 3.7  CL 95*  CO2 27  GLUCOSE 139*  BUN 19  CREATININE 0.64  CALCIUM 8.8*  MG 1.9  PHOS 5.8*    GFR: Estimated Creatinine Clearance: 57.1 mL/min (by C-G formula based on Cr of 0.64).  Liver Function Tests:  Recent Labs Lab 05/08/16 0310  AST 58*  ALT 60*  ALKPHOS 60  BILITOT 0.3  PROT 7.2  ALBUMIN 3.4*   No results for input(s): LIPASE, AMYLASE in the last 168 hours. No results for input(s): AMMONIA in the last 168 hours.  Coagulation Profile: No results for input(s): INR, PROTIME in the last 168 hours.  Cardiac Enzymes: No results for input(s): CKTOTAL, CKMB, CKMBINDEX, TROPONINI in the last 168 hours.  BNP (last 3 results) No results for input(s): PROBNP in the last 8760 hours.  HbA1C: No results for input(s): HGBA1C in the last 72 hours.  CBG: No results for input(s): GLUCAP in the last 168 hours.  Lipid Profile: No results for input(s): CHOL, HDL, LDLCALC, TRIG, CHOLHDL, LDLDIRECT in the last 72 hours.  Thyroid Function Tests: No results for input(s): TSH, T4TOTAL, FREET4, T3FREE, THYROIDAB in the last 72 hours.  Anemia Panel: No results for input(s): VITAMINB12, FOLATE, FERRITIN, TIBC, IRON, RETICCTPCT in the last 72 hours.  Urine analysis:    Component Value Date/Time   COLORURINE YELLOW 04/20/2016 1741   APPEARANCEUR CLEAR 04/20/2016 1741   LABSPEC 1.019 04/20/2016 1741   PHURINE 6.0 04/20/2016 1741   GLUCOSEU NEGATIVE 04/20/2016 1741   HGBUR NEGATIVE 04/20/2016 1741   BILIRUBINUR NEGATIVE 04/20/2016 1741   KETONESUR >80* 04/20/2016 1741   PROTEINUR NEGATIVE 04/20/2016 1741   UROBILINOGEN 1.0 08/14/2015 1140   NITRITE NEGATIVE 04/20/2016 1741   LEUKOCYTESUR NEGATIVE 04/20/2016 1741    Sepsis Labs: Lactic Acid,  Venous No results found for: LATICACIDVEN  MICROBIOLOGY: No results found for this or any previous visit (from the past 240 hour(s)).  RADIOLOGY STUDIES/RESULTS: Dg Chest 2 View  04/20/2016  CLINICAL DATA:  Chest pain and shortness of breath, chronic EXAM: CHEST  2 VIEW COMPARISON:  February 19, 2016 FINDINGS: There is mild scarring in the lingula. Lungs elsewhere are clear. The heart size and pulmonary vascularity are normal. No adenopathy. No pneumothorax. No bone lesions. IMPRESSION: No edema or consolidation.  Slight scarring in the lingula. Electronically Signed   By: Bretta BangWilliam  Woodruff III M.D.   On: 04/20/2016 13:37   Dg Chest Port 1 View  05/04/2016  CLINICAL DATA:  Hypoxia.  COPD.  History of smoking. EXAM: PORTABLE CHEST 1 VIEW COMPARISON:  04/20/2016 FINDINGS: Cardiac silhouette is normal in size and configuration. Normal mediastinal and hilar contours. Clear lungs.  No pleural effusion or pneumothorax. Right PICC is new from the prior study. Tip lies in the lower superior vena cava. Bony thorax is intact. IMPRESSION: No active disease. Electronically Signed   By: Renard Hamperavid  Ormond M.D.  On: 05/04/2016 15:07     LOS: 20 days   Jeoffrey Massed, MD  [facility] Pager:[phone]  If 7PM-7AM, please contact night-coverage [email or web address] Password [email or web address] 05/11/2016, 9:07 AM

## 2016-05-11 NOTE — Progress Notes (Signed)
Nutrition Follow-up  DOCUMENTATION CODES:   Not applicable  INTERVENTION:  - Continue Ensure Enlive BID - Continue to encourage PO intakes  NUTRITION DIAGNOSIS:   Inadequate oral intake related to acute illness as evidenced by meal completion < 25%. -resolved  No new nutrition dx.   GOAL:   Patient will meet greater than or equal to 90% of their needs -met  MONITOR:   PO intake, Supplement acceptance, Weight trends, Labs, I & O's  REASON FOR ASSESSMENT:   Malnutrition Screening Tool    ASSESSMENT:   55 y.o. female with medical history significant of alcohol abuse, COPD and hep C who presented to the ED because of epigastric abdominal pain, nausea and shakes. Patient is noncompliant with medical advises, showed up to the emergency department 8 times in the past 6 months of 4 time she was admitted. She was recently in the hospital from March 1 till March 20 for alcohol detoxification which required drug-induced coma. Reported 2 days after discharge from the hospital she started to drink alcohol. Reports she drinks 2 pints per day, she just decided yesterday she is sick and tired of drinking and she wants to quit so she drank about 3 in the morning and came into the ED seeking detoxification.  Pt continues with 50-100% intakes of meals. Ensure Enlive order remains in place. No new weight since 04/25/26. Pt is meeting needs. Medications reviewed. Labs reviewed; Na: 130 mmol/L, Cl: 95 mmol/L, Ca: 8.8 mg/dL, Phos: 5.8 mg/dL, AST/ALT elevated.  Diet Order:  Diet regular Room service appropriate?: Yes; Fluid consistency:: Thin  Skin:  Reviewed, no issues  Last BM:  5/21  Height:   Ht Readings from Last 1 Encounters:  04/20/16 5' (1.524 m)    Weight:   Wt Readings from Last 1 Encounters:  04/25/16 119 lb 0.8 oz (54 kg)    Ideal Body Weight:  45.45 kg (kg)  BMI:  Body mass index is 23.25 kg/(m^2).  Estimated Nutritional Needs:   Kcal:  1315-1580 (25-30  kcal/kg)  Protein:  55-65 grams  Fluid:  >/= 1.5 L/day  EDUCATION NEEDS:   No education needs identified at this time     Jarome Matin, RD, LDN Inpatient Clinical Dietitian Pager # [phone] After hours/weekend pager # [phone]

## 2016-05-12 ENCOUNTER — Emergency Department (HOSPITAL_COMMUNITY)
Admission: EM | Admit: 2016-05-12 | Discharge: 2016-05-12 | Discharge status: Against Medical Advice | Payer: Medicaid Other

## 2016-05-12 DIAGNOSIS — R443 Hallucinations, unspecified: Secondary | ICD-10-CM

## 2016-05-12 MED ORDER — THIAMINE HCL 100 MG PO TABS: 100.0000 mg | ORAL_TABLET | Freq: Every day | ORAL | Status: AC

## 2016-05-12 MED ORDER — BUDESONIDE-FORMOTEROL FUMARATE 160-4.5 MCG/ACT IN AERO: 2.0000 | INHALATION_SPRAY | Freq: Two times a day (BID) | RESPIRATORY_TRACT | Status: AC

## 2016-05-12 MED ORDER — HALOPERIDOL 5 MG PO TABS: 5.0000 mg | ORAL_TABLET | Freq: Two times a day (BID) | ORAL | Status: AC

## 2016-05-12 MED ORDER — GABAPENTIN 400 MG PO CAPS: 400.0000 mg | ORAL_CAPSULE | Freq: Three times a day (TID) | ORAL | Status: AC

## 2016-05-12 MED ORDER — BENZTROPINE MESYLATE 1 MG PO TABS: 1.0000 mg | ORAL_TABLET | Freq: Two times a day (BID) | ORAL | Status: AC

## 2016-05-12 MED ORDER — ADULT MULTIVITAMIN W/MINERALS CH: 1.0000 | ORAL_TABLET | Freq: Every day | ORAL | Status: AC

## 2016-05-12 MED ORDER — FOLIC ACID 1 MG PO TABS: 1.0000 mg | ORAL_TABLET | Freq: Every day | ORAL | Status: AC

## 2016-05-12 MED ORDER — TIOTROPIUM BROMIDE MONOHYDRATE 18 MCG IN CAPS: 18.0000 ug | ORAL_CAPSULE | Freq: Every day | RESPIRATORY_TRACT | Status: AC

## 2016-05-12 MED ORDER — ENSURE ENLIVE PO LIQD: 237.0000 mL | Freq: Two times a day (BID) | ORAL | Status: AC

## 2016-05-12 MED ORDER — ALBUTEROL SULFATE HFA 108 (90 BASE) MCG/ACT IN AERS: 2.0000 | INHALATION_SPRAY | RESPIRATORY_TRACT | Status: AC | PRN

## 2016-05-12 MED ORDER — CHLORDIAZEPOXIDE HCL 25 MG PO CAPS: 25.0000 mg | ORAL_CAPSULE | Freq: Two times a day (BID) | ORAL | Status: AC

## 2016-05-12 NOTE — ED Notes (Signed)
Pt called for triage room, no response.

## 2016-05-12 NOTE — Discharge Instructions (Signed)
Follow appointment on [phone] at 5pm/call [phone] if problems./Elissia Spiewak Sharlet SalinaDavis, BSN,RN3,CCM.

## 2016-05-12 NOTE — Progress Notes (Signed)
[id number]/Auda Finfrock,BSN,RN3,CCM:  [facility] made for tomorrow at 5pm and given with instructions to patient.  Verbal understanding received from patient.

## 2016-05-12 NOTE — Progress Notes (Signed)
Calm cooperative. Answers questions appropriately. No hallucinations today. No longer on any restraints  Ambulating in the hallway-with tech Per RN has been cooperative, following commands and no aggressive behaviour Dr Elsie SaasJonnalagadda called me-and has cleared the patient to go home Patient does NOT want to go to inpatient long term rehab directly from the hospital, she claims that she will call a "couple of places" when she gets home.  Will ask social work to rescind the IVC order per Dr Elsie SaasJonnalagadda  See d/c summary for details.

## 2016-05-12 NOTE — Consult Note (Signed)
[facility] Face-to-Face Psychiatry Consult Follow Up  Reason for Consult:  Alcohol intoxication and withdrawal and depression Referring Physician:  Dr. Karleen Hampshire Patient Identification: Rhonda Mitchell MRN:  [id number] Principal Diagnosis: Alcohol withdrawal [facility]) Diagnosis:   Patient Active Problem List   Diagnosis Date Noted  . Acute encephalopathy [G93.40]   . Centrilobular emphysema ([place]) [J43.2]   . Encephalopathy acute [G93.40] 04/23/2016  . Alcohol dependence with alcohol-induced mood disorder ([place]) [F10.24] 03/05/2016  . Abnormality of gait [R26.9]   . Lower extremity weakness [R29.898]   . Pressure ulcer [L89.90] 02/26/2016  . Tachypnea [R06.82]   . Depression [F32.9]   . Hypernatremia [E87.0] 02/20/2016  . Anemia [D64.9] 02/20/2016  . UTI (lower urinary tract infection) [N39.0]   . Abdominal pain, vomiting, and diarrhea [R10.9, R11.10, R19.7] 12/18/2015  . Alcohol abuse [F10.10] 12/18/2015  . Abdominal pain [R10.9] 12/18/2015  . Elevated blood pressure [R03.0] 12/18/2015  . Severe recurrent major depression without psychotic features ([place]) [F33.2] 10/28/2015  . Alcohol dependence with uncomplicated withdrawal ([place]) [F10.230] 10/27/2015  . Alcohol-induced mood disorder ([place]) [F10.94] 10/27/2015  . Psychosis [F29] 08/21/2015  . Hypomagnesemia [E83.42]   . Protein-calorie malnutrition ([place]) [E46]   . Hepatitis C antibody test positive [R89.4]   . Elevated LFTs [R79.89]   . Hyperkalemia [E87.5]   . Severe protein-calorie malnutrition ([place]) [E43]   . Acute respiratory failure with hypoxia ([place]) [J96.01] 08/03/2015  . Septic olecranon bursitis of right elbow [M70.21, B96.89] 05/08/2015  . Septic bursitis of elbow [M71.129] 05/07/2015  . Thrombocytopenia ([place]) [D69.6] 05/07/2015  . Chronic hepatitis C without hepatic coma ([place]) [B18.2] 05/07/2015  . Hypokalemia [E87.6] 05/07/2015  . Abscess of bursa of elbow [M71.029] 05/07/2015  . COPD exacerbation ([place]) [J44.1] 03/18/2015  .  Hyponatremia [E87.1] 03/18/2015  . Hyperglycemia [R73.9] 03/18/2015  . Alcohol dependence ([place]) [F10.20] 12/07/2014  . Alcohol withdrawal ([place]) [F10.239] 12/07/2014  . Protein-calorie malnutrition, severe ([place]) [E43] 12/07/2014  . Musculoskeletal chest pain [R07.89] 12/07/2014  . Tobacco abuse [Z72.0] 12/07/2014  . Carbuncle and furuncle [L02.92, L02.93] 12/07/2014  . Macrocytic anemia [D53.9] 12/07/2014  . Chest pain [R07.9] 11/30/2014  . Tachycardia [R00.0] 11/30/2014  . COPD (chronic obstructive pulmonary disease) ([place]) [J44.9] 11/30/2014  . Cellulitis and abscess [L03.90, L02.91] 11/30/2014  . Atypical chest pain [R07.89]   . Dehydration [E86.0]     Total Time spent with patient: 20 minutes  Subjective:   Rhonda Mitchell is a 55 y.o. female patient admitted with abdominal pain, nausea and withdrawal shakes.  HPI:  Rhonda Mitchell is a 55 y.o. female, seen, chart reviewed for the face-to-face psychiatric consultation and evaluation increased agitation and alcohol withdrawal symptoms. Patient is a poor historian and seems to be confused and could not provide linear and goal-directed history even after 10 days being in hospital and seems like it medically stable. Patient is psychiatrically not stable to continue to be irritable, as stated and cursing and yelling at staff members. Review of medical records indicated that patient presented to the emergency department with the epigastric abdominal pain, nausea and withdrawal shakes. Patient is known to the psychiatric services from her multiple hospital encounter some admissions for alcohol withdrawal symptoms and detox treatment. Patient reportedly stopped drinking alcohol a few days ago and seems to be confused does not even know when she came to the hospital. Patient is noncompliant with medical advises, showed up to the emergency department 8 times in the past 6 months of 4 time she was admitted. She was recently in the  hospital from March  1 till March 20 for alcohol detoxification which required drug-induced coma. Past Psychiatric History: She has multiple hospital admission for alcohol detox treatments.  Interval History: Patient has no complaints today and was excited about going home with her husband cousin. Patient reported no symptoms of irritability, agitation, aggressive behavior, depression and denied active suicidal/homicidal ideation and hallucinations. Patient is willing to participate [facility] and also encouraged to be stay sober from drinking alcohol. Spoke with patient husband on phone and husband cousin who came to the hospital to pick her up. He also reported he can stay at least 2 days at home and monitor her and also willing to remove the alcohol containers from home.  Patient husband is a truck driver reportedly stay out of home most of the time. Patient has intact cognitions including orientation, concentration and memory functions. Patient is having mild attitude and behaviors at her baseline. Patient hospitalization is complicated with intermittent agitation and aggressive behaviors. Patient is considered medically and psychiatrically stable to go to the outpatient psychiatric care at this time.  Risk to Self: Is patient at risk for suicide?: No Risk to Others:   Prior Inpatient Therapy:   Prior Outpatient Therapy:    Past Medical History:  Past Medical History  Diagnosis Date  . COPD (chronic obstructive pulmonary disease) (HCC)   . Anxiety   . Shortness of breath   . Arthritis   . GERD (gastroesophageal reflux disease)   . Full dentures   . Insomnia   . Alcohol abuse   . Hepatitis C   . Alcohol abuse   . History of MRSA infection   . History of encephalopathy     Past Surgical History  Procedure Laterality Date  . Arm surgery    . Hand surgery  1990    both rt/lt carpal tunnels  . Shoulder surgery      right and left-age [age]  . Orif ulnar / radial shaft  fracture  1990    right-got infection-had total 10 surgeies 1990  . Orif ulnar fracture Left 05/23/2014    Procedure: OPEN REDUCTION INTERNAL FIXATION (ORIF) LEFT ULNAR FRACTURE;  Surgeon: Harvie JuniorJohn L Graves, MD;  Location: [facility];  Service: Orthopedics;  Laterality: Left;  . I&d extremity Right 05/07/2015    Procedure: IRRIGATION AND DEBRIDEMENT  ELBOW ;  Surgeon: Dominica SeverinWilliam Gramig, MD;  Location: [facility] OR;  Service: Orthopedics;  Laterality: Right;   Family History:  Family History  Problem Relation Age of Onset  . Breast cancer Mother    Family Psychiatric  History: Unknown Social History:  History  Alcohol Use  . Yes    Comment: 1/5th Vodka every day      History  Drug Use No    Comment: Hx: herion, cocaine 5 yrs ago    Social History   Social History  . Marital Status: Legally Separated    Spouse Name: N/A  . Number of Children: N/A  . Years of Education: N/A   Social History Main Topics  . Smoking status: Current Every Day Smoker -- 1.00 packs/day for 20 years    Types: Cigarettes  . Smokeless tobacco: Never Used  . Alcohol Use: Yes     Comment: 1/5th Vodka every day   . Drug Use: No     Comment: Hx: herion, cocaine 5 yrs ago  . Sexual Activity: Yes    Birth Control/ Protection: Post-menopausal   Other Topics Concern  .  None   Social History Narrative   Additional Social History:    Allergies:  No Known Allergies  Labs:  No results found for this or any previous visit (from the past 48 hour(s)).  Current Facility-Administered Medications  Medication Dose Route Frequency Provider Last Rate Last Dose  . acetaminophen (TYLENOL) tablet 650 mg  650 mg Oral Q6H PRN [name], NP   650 mg at 05/12/16 0807  . albuterol (PROVENTIL) (2.5 MG/3ML) 0.083% nebulizer solution 2.5 mg  2.5 mg Nebulization Q2H PRN Maretta Bees, MD      . arformoterol [facility]) nebulizer solution 15 mcg  15 mcg Nebulization BID [name], NP   15 mcg at 05/09/16  2233  . benztropine (COGENTIN) tablet 1 mg  1 mg Oral BID Leata Mouse, MD   1 mg at 05/12/16 0904  . budesonide (PULMICORT) nebulizer solution 0.5 mg  0.5 mg Nebulization BID [name], NP   0.5 mg at 05/09/16 2232  . chlordiazePOXIDE (LIBRIUM) capsule 25 mg  25 mg Oral TID Maretta Bees, MD   25 mg at 05/12/16 0904  . enoxaparin (LOVENOX) injection 40 mg  40 mg Subcutaneous Q24H Maretta Bees, MD   40 mg at 05/11/16 1433  . feeding supplement (ENSURE ENLIVE) (ENSURE ENLIVE) liquid 237 mL  237 mL Oral BID BM Clydia Llano, MD   237 mL at 05/12/16 1000  . folic acid (FOLVITE) tablet 1 mg  1 mg Oral Daily Clydia Llano, MD   1 mg at 05/12/16 0904  . gabapentin (NEURONTIN) capsule 400 mg  400 mg Oral TID Leata Mouse, MD   400 mg at 05/12/16 0905  . gi cocktail (Maalox,Lidocaine,Donnatal)  30 mL Oral TID PRN Clydia Llano, MD   30 mL at 05/09/16 2315  . guaiFENesin-dextromethorphan (ROBITUSSIN DM) 100-10 MG/5ML syrup 5 mL  5 mL Oral Q4H PRN Kathlen Mody, MD   5 mL at 05/05/16 2348  . haloperidol (HALDOL) tablet 5 mg  5 mg Oral BID Leata Mouse, MD   5 mg at 05/12/16 0015  . haloperidol lactate (HALDOL) injection 5 mg  5 mg Intravenous Q6H PRN Kathlen Mody, MD   5 mg at 05/10/16 0542  . haloperidol lactate (HALDOL) injection 5 mg  5 mg Intramuscular Q6H PRN Maretta Bees, MD   5 mg at 05/09/16 1240  . LORazepam (ATIVAN) injection 2 mg  2 mg Intravenous Q6H PRN Maretta Bees, MD   2 mg at 05/10/16 0521  . LORazepam (ATIVAN) tablet 2 mg  2 mg Oral Q6H PRN Maretta Bees, MD   2 mg at 05/12/16 0910   Or  . LORazepam (ATIVAN) injection 2 mg  2 mg Intramuscular Q6H PRN Shanker Levora Dredge, MD      . menthol-cetylpyridinium (CEPACOL) lozenge 3 mg  1 lozenge Oral PRN Kathlen Mody, MD      . metoprolol (LOPRESSOR) injection 5 mg  5 mg Intravenous Q6H PRN Shanker Levora Dredge, MD      . multivitamin with minerals tablet 1 tablet  1 tablet Oral Daily Clydia Llano, MD   1 tablet at 05/12/16 0904  . nicotine (NICODERM CQ - dosed in mg/24 hours) patch 21 mg  21 mg Transdermal Daily Leda Gauze, NP   21 mg at 05/12/16 0906  . ondansetron (ZOFRAN) tablet 4 mg  4 mg Oral Q6H PRN Clydia Llano, MD       Or  . ondansetron (ZOFRAN) injection 4  mg  4 mg Intravenous Q6H PRN Clydia Llano, MD   4 mg at 05/05/16 0140  . pantoprazole (PROTONIX) EC tablet 40 mg  40 mg Oral Daily Maretta Bees, MD   40 mg at 05/12/16 0904  . polyethylene glycol (MIRALAX / GLYCOLAX) packet 17 g  17 g Oral Daily PRN Leda Gauze, NP   17 g at 05/07/16 2146  . senna-docusate (Senokot-S) tablet 2 tablet  2 tablet Oral QHS PRN Leda Gauze, NP   2 tablet at 05/07/16 2103  . thiamine (VITAMIN B-1) tablet 100 mg  100 mg Oral Daily Clydia Llano, MD   100 mg at 05/12/16 0905    Musculoskeletal: Strength & Muscle Tone: decreased Gait & Station: unable to stand Patient leans: N/A  Psychiatric Specialty Exam: ROS   Blood pressure 144/101, pulse 82, temperature 98 F (36.7 C), temperature source Oral, resp. rate 18, height 5' (1.524 m), weight 54 kg (119 lb 0.8 oz), SpO2 97 %.Body mass index is 23.25 kg/(m^2).  General Appearance: Casual  Eye Contact::  Fair  Speech:  Clear and Coherent  Volume:  Normal  Mood:  Anxious  Affect:  Appropriate and Congruent  Thought Process:  Coherent and Loose  Orientation:  Full (Time, Place, and Person)  Thought Content:  WDL  Suicidal Thoughts:  No  Homicidal Thoughts:  No  Memory:  Immediate;   Good Recent;   Good  Judgement:  Fair  Insight:  Fair  Psychomotor Activity:  Normal  Concentration:  Fair  Recall:  Good  Fund of Knowledge:Fair  Language: Fair  Akathisia:  Negative  Handed:  Right  AIMS (if indicated):     Assets:  Communication Skills Desire for Improvement Financial Resources/Insurance Leisure Time Resilience Social Support Transportation  ADL's:  Intact  Cognition: WNL  Sleep:       Treatment Plan Summary: Patient has Improved from alcohol induced delirium, alcohol withdrawal symptoms and intermittent agitation and aggressive behavior with current medication management.  Continue Librium 25 mg 2-3 times daily for possible alcohol/benzodiazepine withdrawals Continue Haldol 5 mg 2 times daily for agitation and aggressive behavior Continue Cogentin 1 mg mg 2 times daily  for EPS  Continue Neurontin 400 mg 3 times daily for mood swings Encourage to stay sober from drinking alcohol to prevent further detox treatments.  Disposition: Patient is determined psychiatrically stable to be discharged to outpatient psychiatric services at [facility] and patient husband cousin will monitor her at home for couple of days. Patient does not meet criteria for psychiatric inpatient admission. Supportive therapy provided about ongoing stressors.  Nehemiah Settle., MD 05/12/2016 10:53 AM

## 2016-05-12 NOTE — Progress Notes (Signed)
Patient has been medically cleared by attending. Patient DC home. LCSWA completed change of commitment with MD signature. Faxed to [employer]. Patient brother contacted by RN for transport.  DC home. Rhonda BarrackNicole Domenick Quebedeaux, LCSWA

## 2016-05-12 NOTE — Progress Notes (Signed)
[facility] Sitter present until in vehicle and accompanying patient by wheelchair.

## 2016-05-12 NOTE — Progress Notes (Signed)
Pt will be discharged with [facility] to home. AVS given and all questions answered.

## 2016-05-12 NOTE — Progress Notes (Signed)
Pt was calm and cooperative throughout night. Only ativan PO and tylenol given.

## 2016-05-12 NOTE — Discharge Summary (Signed)
PATIENT DETAILS Name: Rhonda Mitchell Age: 55 y.o. Sex: female Date of Birth: [birth year] MRN: [id number]. Admitting Physician: Clydia Llano, MD PCP:No primary care provider on file.  Admit Date: 04/20/2016 Discharge date: 05/12/2016  Recommendations for Outpatient Follow-up:  1. Please repeat CBC/BMET at next visit 2. Please ensure follow up with Psychiatry  PRIMARY DISCHARGE DIAGNOSIS:  Principal Problem:   Alcohol withdrawal (HCC) Active Problems:   Tachycardia   Chronic hepatitis C without hepatic coma (HCC)   Elevated LFTs   Abdominal pain, vomiting, and diarrhea   Encephalopathy acute   Acute encephalopathy   Centrilobular emphysema (HCC)      PAST MEDICAL HISTORY: Past Medical History  Diagnosis Date  . COPD (chronic obstructive pulmonary disease) (HCC)   . Anxiety   . Shortness of breath   . Arthritis   . GERD (gastroesophageal reflux disease)   . Full dentures   . Insomnia   . Alcohol abuse   . Hepatitis C   . Alcohol abuse   . History of MRSA infection   . History of encephalopathy     DISCHARGE MEDICATIONS: Current Discharge Medication List    START taking these medications   Details  chlordiazePOXIDE (LIBRIUM) 25 MG capsule Take 1 capsule (25 mg total) by mouth 2 (two) times daily. Qty: 60 capsule, Refills: 0    feeding supplement, ENSURE ENLIVE, (ENSURE ENLIVE) LIQD Take 237 mLs by mouth 2 (two) times daily between meals. Qty: 60 Bottle, Refills: 0    folic acid (FOLVITE) 1 MG tablet Take 1 tablet (1 mg total) by mouth daily. Qty: 30 tablet, Refills: 0    Multiple Vitamin (MULTIVITAMIN WITH MINERALS) TABS tablet Take 1 tablet by mouth daily. Qty: 30 tablet, Refills: 0      CONTINUE these medications which have CHANGED   Details  albuterol (PROVENTIL HFA;VENTOLIN HFA) 108 (90 Base) MCG/ACT inhaler Inhale 2 puffs into the lungs every 4 (four) hours as needed for wheezing or shortness of breath. Qty: 1 Inhaler, Refills: 0    benztropine  (COGENTIN) 1 MG tablet Take 1 tablet (1 mg total) by mouth 2 (two) times daily. Qty: 60 tablet, Refills: 0    budesonide-formoterol (SYMBICORT) 160-4.5 MCG/ACT inhaler Inhale 2 puffs into the lungs 2 (two) times daily. Reported on 02/20/2016 Qty: 1 Inhaler, Refills: 0    gabapentin (NEURONTIN) 400 MG capsule Take 1 capsule (400 mg total) by mouth 3 (three) times daily. Qty: 120 capsule, Refills: 0    haloperidol (HALDOL) 5 MG tablet Take 1 tablet (5 mg total) by mouth 2 (two) times daily. Qty: 60 tablet, Refills: 0    thiamine 100 MG tablet Take 1 tablet (100 mg total) by mouth daily. Qty: 30 tablet, Refills: 0    tiotropium (SPIRIVA) 18 MCG inhalation capsule Place 1 capsule (18 mcg total) into inhaler and inhale daily. Qty: 30 capsule, Refills: 0      STOP taking these medications     clonazePAM (KLONOPIN) 0.5 MG tablet      DULoxetine (CYMBALTA) 30 MG capsule      nicotine (NICODERM CQ - DOSED IN MG/24 HOURS) 21 mg/24hr patch      oxyCODONE (OXY IR/ROXICODONE) 5 MG immediate release tablet      traZODone (DESYREL) 50 MG tablet         ALLERGIES:  No Known Allergies  BRIEF HPI:  See H&P, Labs, Consult and Test reports for all details in brief, Patient is a 55 y.o. female admitted on 5/14 delirium tremens/alcohol  withdrawal   CONSULTATIONS:   pulmonary/intensive care and psychiatry  PERTINENT RADIOLOGIC STUDIES: Dg Chest 2 View  04/20/2016  CLINICAL DATA:  Chest pain and shortness of breath, chronic EXAM: CHEST  2 VIEW COMPARISON:  February 19, 2016 FINDINGS: There is mild scarring in the lingula. Lungs elsewhere are clear. The heart size and pulmonary vascularity are normal. No adenopathy. No pneumothorax. No bone lesions. IMPRESSION: No edema or consolidation.  Slight scarring in the lingula. Electronically Signed   By: Bretta Bang III M.D.   On: 04/20/2016 13:37   Dg Chest Port 1 View  05/04/2016  CLINICAL DATA:  Hypoxia.  COPD.  History of smoking. EXAM: PORTABLE  CHEST 1 VIEW COMPARISON:  04/20/2016 FINDINGS: Cardiac silhouette is normal in size and configuration. Normal mediastinal and hilar contours. Clear lungs.  No pleural effusion or pneumothorax. Right PICC is new from the prior study. Tip lies in the lower superior vena cava. Bony thorax is intact. IMPRESSION: No active disease. Electronically Signed   By: Amie Portland M.D.   On: 05/04/2016 15:07     PERTINENT LAB RESULTS: CBC: No results for input(s): WBC, HGB, HCT, PLT in the last 72 hours. CMET CMP     Component Value Date/Time   NA 130* 05/08/2016 0310   K 3.7 05/08/2016 0310   CL 95* 05/08/2016 0310   CO2 27 05/08/2016 0310   GLUCOSE 139* 05/08/2016 0310   BUN 19 05/08/2016 0310   CREATININE 0.64 05/08/2016 0310   CALCIUM 8.8* 05/08/2016 0310   PROT 7.2 05/08/2016 0310   ALBUMIN 3.4* 05/08/2016 0310   AST 58* 05/08/2016 0310   ALT 60* 05/08/2016 0310   ALKPHOS 60 05/08/2016 0310   BILITOT 0.3 05/08/2016 0310   GFRNONAA >60 05/08/2016 0310   GFRAA >60 05/08/2016 0310    GFR Estimated Creatinine Clearance: 57.1 mL/min (by C-G formula based on Cr of 0.64). No results for input(s): LIPASE, AMYLASE in the last 72 hours. No results for input(s): CKTOTAL, CKMB, CKMBINDEX, TROPONINI in the last 72 hours. Invalid input(s): POCBNP No results for input(s): DDIMER in the last 72 hours. No results for input(s): HGBA1C in the last 72 hours. No results for input(s): CHOL, HDL, LDLCALC, TRIG, CHOLHDL, LDLDIRECT in the last 72 hours. No results for input(s): TSH, T4TOTAL, T3FREE, THYROIDAB in the last 72 hours.  Invalid input(s): FREET3 No results for input(s): VITAMINB12, FOLATE, FERRITIN, TIBC, IRON, RETICCTPCT in the last 72 hours. Coags: No results for input(s): INR in the last 72 hours.  Invalid input(s): PT Microbiology: No results found for this or any previous visit (from the past 240 hour(s)).   BRIEF HOSPITAL COURSE:  Alcohol withdrawal with delirium tremens: Resolved  with precedex infusion and Ativan per protocol.[facility] KitchenHospital course complicated by delusion/hallucinations-that was felt to not secondary to alcohol but rather from underlying Depression  Active Problems: Depression/Psychoses:Hospital course complicated by delusions/hallucinations/aggressive behaviour requiring prn restraints. This is not related to alcohol withdrawal, suspect more related to her underlying history of depression with psychosis, and probably exacerbated by benzo withdrawal-as she also has been on prn Ativan-but had received it consistently multiple times a day. She was followed by closely by psychiatry, medications were adjusted-she slowly has started to improve and is now less impulsive. She has not required restraints since yesterday, and is calm and quiet this am. She is following commands, ambulated in the morning, and does not have any hallucinations this morning. Spoke with Dr Dr Elsie Saas who has cleared the patient for discharge, IVC  has been rescinded. Patient does not want to pursue inpatient substance abuse rehab right away, she wants to go home first and she claims that she will then call a couple of inpatient substance abuse centers from home. She will be discharged home in the baove noted medications, and has been encouraged to follow up with [facility] for further psych care in the outpatient setting.   Alcoholic hepatitis: LFTs have almost normalized with supportive care. Follow periodically in the outpatient setting  Anemia: Normocytic-suspected related to underlying chronic disease and hep C. Follow periodically  in the outpatient setting  History of chronic hepatitis C: Stable for outpatient follow-up.  COPD: Lungs clear, continue bronchodilators.  TODAY-DAY OF DISCHARGE:  Subjective:   [name] today has no headache,no chest abdominal pain,no new weakness tingling or numbness, feels much better wants to go home today. She ambulated in the hallway with the tech  with no issues at all.  Objective:   Blood pressure 144/101, pulse 82, temperature 98 F (36.7 C), temperature source Oral, resp. rate 18, height 5' (1.524 m), weight 54 kg (119 lb 0.8 oz), SpO2 97 %.  Intake/Output Summary (Last 24 hours) at 05/12/16 1002 Last data filed at 05/12/16 0811  Gross per 24 hour  Intake    840 ml  Output      0 ml  Net    840 ml   Filed Weights   04/20/16 1815 04/25/16 0500  Weight: 52.6 kg (115 lb 15.4 oz) 54 kg (119 lb 0.8 oz)    Exam Awake Alert, Oriented *3, No new F.N deficits, Normal affect [place].AT,PERRAL Supple Neck,No JVD, No cervical lymphadenopathy appriciated.  Symmetrical Chest wall movement, Good air movement bilaterally, CTAB RRR,No Gallops,Rubs or new Murmurs, No Parasternal Heave +ve B.Sounds, Abd Soft, Non tender, No organomegaly appriciated, No rebound -guarding or rigidity. No Cyanosis, Clubbing or edema, No new Rash or bruise  DISCHARGE CONDITION: Stable  DISPOSITION: Home  DISCHARGE INSTRUCTIONS:    Activity:  As tolerated   Get Medicines reviewed and adjusted: Please take all your medications with you for your next visit with your Primary MD  Please request your Primary MD to go over all hospital tests and procedure/radiological results at the follow up, please ask your Primary MD to get all Hospital records sent to his/her office.  If you experience worsening of your admission symptoms, develop shortness of breath, life threatening emergency, suicidal or homicidal thoughts you must seek medical attention immediately by calling 911 or calling your MD immediately  if symptoms less severe.  You must read complete instructions/literature along with all the possible adverse reactions/side effects for all the Medicines you take and that have been prescribed to you. Take any new Medicines after you have completely understood and accpet all the possible adverse reactions/side effects.   Do not drive when taking Pain medications.    Do not take more than prescribed Pain, Sleep and Anxiety Medications  Special Instructions: If you have smoked or chewed Tobacco  in the last 2 yrs please stop smoking, stop any regular Alcohol  and or any Recreational drug use.  Wear Seat belts while driving.  Please note  You were cared for by a hospitalist during your hospital stay. Once you are discharged, your primary care physician will handle any further medical issues. Please note that NO REFILLS for any discharge medications will be authorized once you are discharged, as it is imperative that you return to your primary care physician (or establish a relationship  with a primary care physician if you do not have one) for your aftercare needs so that they can reassess your need for medications and monitor your lab values.   Diet recommendation: Regular Diet  Discharge Instructions    Call MD for:  difficulty breathing, headache or visual disturbances    Complete by:  As directed      Call MD for:  severe uncontrolled pain    Complete by:  As directed      Diet general    Complete by:  As directed      Increase activity slowly    Complete by:  As directed            Follow-up Information    Follow up with [facility]. Go on 05/11/2016.   Why:  [facility]. You can walk in M-F 8am-3:30pm. Please bring hospital dc paperwork   Contact information:   [address], [place], [place] [address] [phone]     Total Time spent on discharge equals 45 minutes.  SignedJeoffrey Massed: Anajah Sterbenz 05/12/2016 10:02 AM

## 2016-05-13 ENCOUNTER — Encounter (HOSPITAL_COMMUNITY): Payer: Self-pay | Admitting: Emergency Medicine

## 2016-05-13 ENCOUNTER — Emergency Department (HOSPITAL_COMMUNITY)
Admission: EM | Admit: 2016-05-13 | Discharge: 2016-05-13 | Disposition: A | Discharge status: Home / Self Care | Payer: Medicaid Other | Attending: Emergency Medicine | Admitting: Emergency Medicine

## 2016-05-13 DIAGNOSIS — S0181XA Laceration without foreign body of other part of head, initial encounter: Secondary | ICD-10-CM | POA: Insufficient documentation

## 2016-05-13 DIAGNOSIS — Y999 Unspecified external cause status: Secondary | ICD-10-CM | POA: Insufficient documentation

## 2016-05-13 DIAGNOSIS — Y939 Activity, unspecified: Secondary | ICD-10-CM | POA: Insufficient documentation

## 2016-05-13 DIAGNOSIS — Z5321 Procedure and treatment not carried out due to patient leaving prior to being seen by health care provider: Secondary | ICD-10-CM | POA: Diagnosis not present

## 2016-05-13 DIAGNOSIS — Y929 Unspecified place or not applicable: Secondary | ICD-10-CM | POA: Insufficient documentation

## 2016-05-13 NOTE — ED Notes (Signed)
Pt states that she has waited too long to see the doctor and would like to leave; pt informed that she is leaving against medical advice and assumes responsibility; pt verbalized understanding
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
## 2016-05-13 NOTE — ED Notes (Signed)
Bed: WA06 Expected date:  Expected time:  Means of arrival:  Comments: EMS 

## 2016-06-27 ENCOUNTER — Encounter (HOSPITAL_COMMUNITY): Payer: Self-pay | Admitting: Emergency Medicine

## 2016-06-27 ENCOUNTER — Emergency Department (HOSPITAL_COMMUNITY): Payer: Medicaid Other

## 2016-06-27 ENCOUNTER — Emergency Department (HOSPITAL_COMMUNITY)
Admission: EM | Admit: 2016-06-27 | Discharge: 2016-06-27 | Disposition: A | Discharge status: Home / Self Care | Payer: Medicaid Other | Attending: Emergency Medicine | Admitting: Emergency Medicine

## 2016-06-27 DIAGNOSIS — S63290A Dislocation of distal interphalangeal joint of right index finger, initial encounter: Secondary | ICD-10-CM | POA: Insufficient documentation

## 2016-06-27 DIAGNOSIS — S161XXA Strain of muscle, fascia and tendon at neck level, initial encounter: Secondary | ICD-10-CM

## 2016-06-27 DIAGNOSIS — F17210 Nicotine dependence, cigarettes, uncomplicated: Secondary | ICD-10-CM | POA: Diagnosis not present

## 2016-06-27 DIAGNOSIS — W1830XA Fall on same level, unspecified, initial encounter: Secondary | ICD-10-CM | POA: Diagnosis not present

## 2016-06-27 DIAGNOSIS — F10920 Alcohol use, unspecified with intoxication, uncomplicated: Secondary | ICD-10-CM

## 2016-06-27 DIAGNOSIS — Y939 Activity, unspecified: Secondary | ICD-10-CM | POA: Insufficient documentation

## 2016-06-27 DIAGNOSIS — Y929 Unspecified place or not applicable: Secondary | ICD-10-CM | POA: Diagnosis not present

## 2016-06-27 DIAGNOSIS — F10129 Alcohol abuse with intoxication, unspecified: Secondary | ICD-10-CM | POA: Insufficient documentation

## 2016-06-27 DIAGNOSIS — I6203 Nontraumatic chronic subdural hemorrhage: Secondary | ICD-10-CM | POA: Insufficient documentation

## 2016-06-27 DIAGNOSIS — S0083XA Contusion of other part of head, initial encounter: Secondary | ICD-10-CM | POA: Insufficient documentation

## 2016-06-27 DIAGNOSIS — J449 Chronic obstructive pulmonary disease, unspecified: Secondary | ICD-10-CM | POA: Diagnosis not present

## 2016-06-27 DIAGNOSIS — S61209A Unspecified open wound of unspecified finger without damage to nail, initial encounter: Secondary | ICD-10-CM

## 2016-06-27 DIAGNOSIS — S63259A Unspecified dislocation of unspecified finger, initial encounter: Secondary | ICD-10-CM

## 2016-06-27 DIAGNOSIS — Y999 Unspecified external cause status: Secondary | ICD-10-CM | POA: Diagnosis not present

## 2016-06-27 DIAGNOSIS — S0990XA Unspecified injury of head, initial encounter: Secondary | ICD-10-CM

## 2016-06-27 DIAGNOSIS — S6991XA Unspecified injury of right wrist, hand and finger(s), initial encounter: Secondary | ICD-10-CM | POA: Diagnosis present

## 2016-06-27 LAB — CBC WITH DIFFERENTIAL/PLATELET
BASOS PCT: 0 %
Basophils Absolute: 0 10*3/uL (ref 0.0–0.1)
EOS ABS: 0.1 10*3/uL (ref 0.0–0.7)
EOS PCT: 1 %
HEMATOCRIT: 35 % — AB (ref 36.0–46.0)
Hemoglobin: 11.2 g/dL — ABNORMAL LOW (ref 12.0–15.0)
Lymphocytes Relative: 34 %
Lymphs Abs: 2.3 10*3/uL (ref 0.7–4.0)
MCH: 28.3 pg (ref 26.0–34.0)
MCHC: 32 g/dL (ref 30.0–36.0)
MCV: 88.4 fL (ref 78.0–100.0)
MONO ABS: 0.3 10*3/uL (ref 0.1–1.0)
MONOS PCT: 5 %
Neutro Abs: 4.1 10*3/uL (ref 1.7–7.7)
Neutrophils Relative %: 60 %
Platelets: 350 10*3/uL (ref 150–400)
RBC: 3.96 MIL/uL (ref 3.87–5.11)
RDW: 16.1 % — AB (ref 11.5–15.5)
WBC: 6.9 10*3/uL (ref 4.0–10.5)

## 2016-06-27 LAB — BASIC METABOLIC PANEL
Anion gap: 10 (ref 5–15)
CALCIUM: 8.9 mg/dL (ref 8.9–10.3)
CO2: 22 mmol/L (ref 22–32)
CREATININE: 0.67 mg/dL (ref 0.44–1.00)
Chloride: 103 mmol/L (ref 101–111)
GFR calc Af Amer: >60 mL/min (ref >60)
GFR calc non Af Amer: >60 mL/min (ref >60)
GLUCOSE: 103 mg/dL — AB (ref 65–99)
Potassium: 3.6 mmol/L (ref 3.5–5.1)
Sodium: 135 mmol/L (ref 135–145)

## 2016-06-27 LAB — ETHANOL: ALCOHOL ETHYL (B): 206 mg/dL — AB (ref <5)

## 2016-06-27 MED ORDER — CEFTRIAXONE SODIUM 1 G IJ SOLR
1.0000 g | Freq: Once | INTRAMUSCULAR | Status: AC
  Administered 2016-06-27: 1 g via INTRAMUSCULAR
  Filled 2016-06-27: qty 10

## 2016-06-27 MED ORDER — HYDROCODONE-ACETAMINOPHEN 5-325 MG PO TABS: 1.0000 | ORAL_TABLET | Freq: Four times a day (QID) | ORAL | Status: AC | PRN

## 2016-06-27 MED ORDER — LIDOCAINE HCL (PF) 2 % IJ SOLN
10.0000 mL | Freq: Once | INTRAMUSCULAR | Status: AC
  Administered 2016-06-27: 10 mL via INTRADERMAL
  Filled 2016-06-27: qty 10

## 2016-06-27 MED ORDER — CEPHALEXIN 500 MG PO CAPS: 500.0000 mg | ORAL_CAPSULE | Freq: Four times a day (QID) | ORAL | Status: AC

## 2016-06-27 MED ORDER — CEFAZOLIN IN D5W 1 GM/50ML IV SOLN: 1.0000 g | Freq: Once | INTRAVENOUS | Status: DC

## 2016-06-27 MED ORDER — MORPHINE SULFATE (PF) 4 MG/ML IV SOLN
4.0000 mg | Freq: Once | INTRAVENOUS | Status: AC
  Administered 2016-06-27: 4 mg via INTRAVENOUS
  Filled 2016-06-27: qty 1

## 2016-06-27 NOTE — Progress Notes (Signed)
Orthopedic Tech Progress Note Patient Details:  Sherrilee GillesMichelle Sacra 05/27/1961 [id number]  Ortho Devices Type of Ortho Device: Finger splint Ortho Device/Splint Location: RUE index finger Ortho Device/Splint Interventions: Ordered, Application   Jennye MoccasinHughes, Mekiyah Gladwell Craig 06/27/2016, 5:42 PM

## 2016-06-27 NOTE — ED Notes (Signed)
md suturing pt

## 2016-06-27 NOTE — ED Notes (Signed)
MD at bedside. 

## 2016-06-27 NOTE — Discharge Instructions (Signed)
Keflex as prescribed.  Hydrocodone as prescribed as needed for pain.  Follow-up with Dr. Merlyn LotKuzma in the hand surgery office this week. Call on Monday to make these arrangements. His contact information has been provided in this discharge summary.   Finger Dislocation Finger dislocation is the displacement of bones in your finger at the joints. Most commonly, finger dislocation occurs at the proximal interphalangeal joint (the joint closest to your knuckle). Very strong, fibrous tissues (ligaments) and joint capsules connect the three bones of your fingers.  CAUSES Dislocation is caused by a forceful impact. This impact moves these bones off the joint and often tears your ligaments.  SYMPTOMS Symptoms of finger dislocation include:  Deformity of your finger.  Pain, with loss of movement. DIAGNOSIS  Finger dislocation is diagnosed with a physical exam. Often, X-ray exams are done to see if you have associated injuries, such as bone fractures. TREATMENT  Finger dislocations are treated by putting your bones back into position (reduction) either by manually moving the bones back into place or through surgery. Your finger is then kept in a fixed position (immobilized) with the use of a dressing or splint for a brief period. When your ligament has to be surgically repaired, it needs to be kept in a fixed position with a dressing or splint for 1 to 2 weeks. Because joint stiffness is a long-term complication of finger dislocation, hand exercises or physical therapy to increase the range of motion and to regain strength is usually started as soon as the ligament is healed. Exercises and therapy generally last no more than 3 months. HOME CARE INSTRUCTIONS The following measures can help to reduce pain and speed up the healing process:  Rest your injured joint. Do not move until instructed otherwise by your caregiver. Avoid activities similar to the one that caused your injury.  Apply ice to your  injured joint for the first day or 2 after your reduction or as directed by your caregiver. Applying ice helps to reduce inflammation and pain.  Put ice in a plastic bag.  Place a towel between your skin and the bag.  Leave the ice on for 15-20 minutes at a time, every 2 hours while you are awake.  Elevate your hand above your heart as directed by your caregiver to reduce swelling.  Take over-the-counter or prescription medicine for pain as your caregiver instructs you. SEEK IMMEDIATE MEDICAL CARE IF:  Your dressing or splint becomes damaged.  Your pain becomes worse rather than better.  You lose feeling in your finger, or it becomes cold and white. MAKE SURE YOU:  Understand these instructions.  Will watch your condition.  Will get help right away if you are not doing well or get worse.   This information is not intended to replace advice given to you by your health care provider. Make sure you discuss any questions you have with your health care provider.   Document Released: 12/04/2000 Document Revised: 12/28/2014 Document Reviewed: 05/03/2015 Elsevier Interactive Patient Education [employer]2016 Elsevier Inc.

## 2016-06-27 NOTE — ED Provider Notes (Signed)
CSN: [id number]     Arrival date & time 06/27/16  1503 History   First MD [id number] Contact with Patient 06/27/16 1508     Chief Complaint  Patient presents with  . Fall  . Hand Injury  . Alcohol Intoxication     (Consider location/radiation/quality/duration/timing/severity/associated sxs/prior Treatment) HPI Comments: Patient is a 55 year old female with history of COPD, anxiety, and chronic alcoholism. She is brought by EMS after a fall. She was apparently intoxicated when she fell and injured her right index finger. There is a laceration with obvious deformity.  Patient is a 55 y.o. female presenting with fall, hand injury, and intoxication. The history is provided by the patient.  Fall This is a new problem. The current episode started less than 1 hour ago. The problem occurs constantly. The problem has not changed since onset.Nothing aggravates the symptoms. Nothing relieves the symptoms. She has tried nothing for the symptoms. The treatment provided no relief.  Hand Injury Alcohol Intoxication    Past Medical History  Diagnosis Date  . COPD (chronic obstructive pulmonary disease) (HCC)   . Anxiety   . Shortness of breath   . Arthritis   . GERD (gastroesophageal reflux disease)   . Full dentures   . Insomnia   . Alcohol abuse   . Hepatitis C   . Alcohol abuse   . History of MRSA infection   . History of encephalopathy    Past Surgical History  Procedure Laterality Date  . Arm surgery    . Hand surgery  1990    both rt/lt carpal tunnels  . Shoulder surgery      right and left-age [age]  . Orif ulnar / radial shaft fracture  1990    right-got infection-had total 10 surgeies 1990  . Orif ulnar fracture Left 05/23/2014    Procedure: OPEN REDUCTION INTERNAL FIXATION (ORIF) LEFT ULNAR FRACTURE;  Surgeon: Harvie JuniorJohn L Graves, MD;  Location: [facility];  Service: Orthopedics;  Laterality: Left;  . I&d extremity Right 05/07/2015    Procedure: IRRIGATION AND  DEBRIDEMENT  ELBOW ;  Surgeon: Dominica SeverinWilliam Gramig, MD;  Location: [facility] OR;  Service: Orthopedics;  Laterality: Right;   Family History  Problem Relation Age of Onset  . Breast cancer Mother    Social History  Substance Use Topics  . Smoking status: Current Every Day Smoker -- 1.00 packs/day for 20 years    Types: Cigarettes  . Smokeless tobacco: Never Used  . Alcohol Use: Yes     Comment: 1/5th Vodka every day    OB History    No data available     Review of Systems  All other systems reviewed and are negative.     Allergies  Review of patient's allergies indicates no known allergies.  Home Medications   Prior to Admission medications   Medication Sig Start Date End Date Taking? Authorizing Provider  albuterol (PROVENTIL HFA;VENTOLIN HFA) 108 (90 Base) MCG/ACT inhaler Inhale 2 puffs into the lungs every 4 (four) hours as needed for wheezing or shortness of breath. 05/12/16   Maretta BeesShanker M Ghimire, MD  benztropine (COGENTIN) 1 MG tablet Take 1 tablet (1 mg total) by mouth 2 (two) times daily. 05/12/16   Shanker Levora DredgeM Ghimire, MD  budesonide-formoterol (SYMBICORT) 160-4.5 MCG/ACT inhaler Inhale 2 puffs into the lungs 2 (two) times daily. Reported on 02/20/2016 05/12/16   Maretta BeesShanker M Ghimire, MD  chlordiazePOXIDE (LIBRIUM) 25 MG capsule Take 1 capsule (25 mg total) by mouth 2 (two) times daily.  05/12/16   Shanker Levora Dredge, MD  feeding supplement, ENSURE ENLIVE, (ENSURE ENLIVE) LIQD Take 237 mLs by mouth 2 (two) times daily between meals. 05/12/16   Shanker Levora Dredge, MD  folic acid (FOLVITE) 1 MG tablet Take 1 tablet (1 mg total) by mouth daily. 05/12/16   Shanker Levora Dredge, MD  gabapentin (NEURONTIN) 400 MG capsule Take 1 capsule (400 mg total) by mouth 3 (three) times daily. 05/12/16   Shanker Levora Dredge, MD  haloperidol (HALDOL) 5 MG tablet Take 1 tablet (5 mg total) by mouth 2 (two) times daily. 05/12/16   Shanker Levora Dredge, MD  Multiple Vitamin (MULTIVITAMIN WITH MINERALS) TABS tablet Take 1 tablet by  mouth daily. 05/12/16   Shanker Levora Dredge, MD  thiamine 100 MG tablet Take 1 tablet (100 mg total) by mouth daily. 05/12/16   Shanker Levora Dredge, MD  tiotropium (SPIRIVA) 18 MCG inhalation capsule Place 1 capsule (18 mcg total) into inhaler and inhale daily. 05/12/16   Shanker Levora Dredge, MD   SpO2 95% Physical Exam  Constitutional: She is oriented to person, place, and time. She appears well-developed and well-nourished. No distress.  Patient is a 55 year old female in no acute distress. She is clearly intoxicated with a strong odor of alcohol.  HENT:  Head: Normocephalic and atraumatic.  Neck: Normal range of motion. Neck supple.  Cardiovascular: Normal rate and regular rhythm.  Exam reveals no gallop and no friction rub.   No murmur heard. Pulmonary/Chest: Effort normal and breath sounds normal. No respiratory distress. She has no wheezes.  Abdominal: Soft. Bowel sounds are normal. She exhibits no distension. There is no tenderness.  Musculoskeletal: Normal range of motion.  There is obvious deformity to the right index finger. There is a 1.5 cm laceration to the flexor aspect.  Neurological: She is alert and oriented to person, place, and time.  Skin: Skin is warm and dry. She is not diaphoretic.  Nursing note and vitals reviewed.   ED Course  Procedures (including critical care time) Labs Review Labs Reviewed - No data to display  Imaging Review No results found. I have personally reviewed and evaluated these images and lab results as part of my medical decision-making.  LACERATION REPAIR Performed by: Geoffery Lyons Authorized by: Geoffery Lyons Consent: Verbal consent obtained. Risks and benefits: risks, benefits and alternatives were discussed Consent given by: patient Patient identity confirmed: provided demographic data Prepped and Draped in normal sterile fashion Wound explored  Laceration Location: Right index finger  Laceration Length: 1.5 cm  No Foreign Bodies seen  or palpated  Anesthesia: local infiltration, digital block   Local anesthetic: lidocaine 2 % without epinephrine  Anesthetic total: 8 ml  Irrigation method: syringe Amount of cleaning: standard  Skin closure: 4-0 Prolene   Number of sutures: 3   Technique: Simple interrupted   Patient tolerance: Patient tolerated the procedure well with no immediate complications.   MDM   Final diagnoses:  None    Patient presents after a fall while intoxicated. She has an open dislocation of the right index finger. A digital block was performed and the dislocation was reduced. I then spoke with Dr. Merlyn Lot from hand surgery. He is recommending irrigating copiously, antibiotics, splinting, and follow-up in the office. This was performed as above.  The patient is intoxicated and also has a contusion to her forehead. She is complaining of pain in her neck. 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 Cervical spine is unremarkable. She  has requested pain medication on multiple occasions. She was given 1 dose of morphine. She will be discharged with Keflex, pain medication, and follow-up next week with hand surgery.    Geoffery Lyons, MD 06/27/16 [phone]

## 2016-06-27 NOTE — ED Notes (Signed)
Pt arrives from home via [facility] reporting fall today with deformity to R hand.  Pt states, "I fell because I was drunk.: Pt unable to describe fall.  Hematoma noted to R forehead, swelling noted to R wrist, deformity noted to R index finger.

## 2016-06-30 ENCOUNTER — Encounter (HOSPITAL_COMMUNITY): Payer: Self-pay | Admitting: Emergency Medicine

## 2016-06-30 ENCOUNTER — Emergency Department (HOSPITAL_COMMUNITY)
Admission: EM | Admit: 2016-06-30 | Discharge: 2016-06-30 | Disposition: A | Discharge status: Home / Self Care | Payer: Medicaid Other | Attending: Emergency Medicine | Admitting: Emergency Medicine

## 2016-06-30 DIAGNOSIS — J449 Chronic obstructive pulmonary disease, unspecified: Secondary | ICD-10-CM | POA: Insufficient documentation

## 2016-06-30 DIAGNOSIS — F10129 Alcohol abuse with intoxication, unspecified: Secondary | ICD-10-CM | POA: Insufficient documentation

## 2016-06-30 DIAGNOSIS — M1990 Unspecified osteoarthritis, unspecified site: Secondary | ICD-10-CM | POA: Insufficient documentation

## 2016-06-30 DIAGNOSIS — F17210 Nicotine dependence, cigarettes, uncomplicated: Secondary | ICD-10-CM | POA: Insufficient documentation

## 2016-06-30 DIAGNOSIS — F10920 Alcohol use, unspecified with intoxication, uncomplicated: Secondary | ICD-10-CM

## 2016-06-30 LAB — URINE MICROSCOPIC-ADD ON

## 2016-06-30 LAB — CBC WITH DIFFERENTIAL/PLATELET
BASOS ABS: 0 10*3/uL (ref 0.0–0.1)
Basophils Relative: 0 %
EOS PCT: 0 %
Eosinophils Absolute: 0 10*3/uL (ref 0.0–0.7)
HEMATOCRIT: 35.7 % — AB (ref 36.0–46.0)
Hemoglobin: 11.7 g/dL — ABNORMAL LOW (ref 12.0–15.0)
LYMPHS ABS: 1.4 10*3/uL (ref 0.7–4.0)
LYMPHS PCT: 8 %
MCH: 29 pg (ref 26.0–34.0)
MCHC: 32.8 g/dL (ref 30.0–36.0)
MCV: 88.6 fL (ref 78.0–100.0)
MONO ABS: 1.3 10*3/uL — AB (ref 0.1–1.0)
Monocytes Relative: 7 %
NEUTROS ABS: 15.8 10*3/uL — AB (ref 1.7–7.7)
Neutrophils Relative %: 85 %
Platelets: 426 10*3/uL — ABNORMAL HIGH (ref 150–400)
RBC: 4.03 MIL/uL (ref 3.87–5.11)
RDW: 16.7 % — AB (ref 11.5–15.5)
WBC: 18.6 10*3/uL — ABNORMAL HIGH (ref 4.0–10.5)

## 2016-06-30 LAB — URINALYSIS, ROUTINE W REFLEX MICROSCOPIC
Bilirubin Urine: NEGATIVE
GLUCOSE, UA: NEGATIVE mg/dL
Ketones, ur: >80 mg/dL — AB
LEUKOCYTES UA: NEGATIVE
Nitrite: NEGATIVE
PH: 5.5 (ref 5.0–8.0)
Protein, ur: NEGATIVE mg/dL
Specific Gravity, Urine: 1.017 (ref 1.005–1.030)

## 2016-06-30 MED ORDER — SODIUM CHLORIDE 0.9 % IV BOLUS (SEPSIS): 1000.0000 mL | Freq: Once | INTRAVENOUS | Status: DC

## 2016-06-30 NOTE — ED Notes (Signed)
Multiple attempts made with bedpan. Patient could not produce sample. Bladder scanned and Cathed.

## 2016-06-30 NOTE — ED Notes (Signed)
Food and  Beverage was given to patient but she refused.

## 2016-06-30 NOTE — Discharge Instructions (Signed)
Alcohol Intoxication °Alcohol intoxication occurs when the amount of alcohol that a person has consumed impairs his or her ability to mentally and physically function. Alcohol directly impairs the normal chemical activity of the brain. Drinking large amounts of alcohol can lead to changes in mental function and behavior, and it can cause many physical effects that can be harmful.  °Alcohol intoxication can range in severity from mild to very severe. Various factors can affect the level of intoxication that occurs, such as the person's age, gender, weight, frequency of alcohol consumption, and the presence of other medical conditions (such as diabetes, seizures, or heart conditions). Dangerous levels of alcohol intoxication may occur when people drink large amounts of alcohol in a short period (binge drinking). Alcohol can also be especially dangerous when combined with certain prescription medicines or "recreational" drugs. °SIGNS AND SYMPTOMS °Some common signs and symptoms of mild alcohol intoxication include: °· Loss of coordination. °· Changes in mood and behavior. °· Impaired judgment. °· Slurred speech. °As alcohol intoxication progresses to more severe levels, other signs and symptoms will appear. These may include: °· Vomiting. °· Confusion and impaired memory. °· Slowed breathing. °· Seizures. °· Loss of consciousness. °DIAGNOSIS  °Your health care provider will take a medical history and perform a physical exam. You will be asked about the amount and type of alcohol you have consumed. Blood tests will be done to measure the concentration of alcohol in your blood. In many places, your blood alcohol level must be lower than 80 mg/dL (0.08%) to legally drive. However, many dangerous effects of alcohol can occur at much lower levels.  °TREATMENT  °People with alcohol intoxication often do not require treatment. Most of the effects of alcohol intoxication are temporary, and they go away as the alcohol naturally  leaves the body. Your health care provider will monitor your condition until you are stable enough to go home. Fluids are sometimes given through an IV access tube to help prevent dehydration.  °HOME CARE INSTRUCTIONS °· Do not drive after drinking alcohol. °· Stay hydrated. Drink enough water and fluids to keep your urine clear or pale yellow. Avoid caffeine.   °· Only take over-the-counter or prescription medicines as directed by your health care provider.   °SEEK MEDICAL CARE IF:  °· You have persistent vomiting.   °· You do not feel better after a few days. °· You have frequent alcohol intoxication. Your health care provider can help determine if you should see a substance use treatment counselor. °SEEK IMMEDIATE MEDICAL CARE IF:  °· You become shaky or tremble when you try to stop drinking.   °· You shake uncontrollably (seizure).   °· You throw up (vomit) blood. This may be bright red or may look like black coffee grounds.   °· You have blood in your stool. This may be bright red or may appear as a black, tarry, bad smelling stool.   °· You become lightheaded or faint.   °MAKE SURE YOU:  °· Understand these instructions. °· Will watch your condition. °· Will get help right away if you are not doing well or get worse. °  °This information is not intended to replace advice given to you by your health care provider. Make sure you discuss any questions you have with your health care provider. °  °Document Released: 09/16/2005 Document Revised: 08/09/2013 Document Reviewed: 05/12/2013 °Elsevier Interactive Patient Education ©2016 Elsevier Inc. °Community Resource Guide Outpatient Counseling/Substance Abuse Adult °The United Way’s “211” is a great source of information about community services available.    Access by dialing 2-1-1 from anywhere in [place], or by website -  www.nc211.org.  ° °Other Local Resources (Updated 12/2015) ° °Crisis Hotlines °  °Services  ° °  °Area Served  °[facility] • Crisis Hotline, available 24 hours a day, 7 days a week: 800-939-5911 [place], [place]  ° [facility] • Crisis Hotline, available 24 hours a day, 7 days a week: 866-275-9552 [place], [place]  °[facility] • Suicide Prevention Hotline, available 24 hours a day, 7 days a week: 800-273-8255 [place], [place]  °[facility] ° • Crisis Hotline, available 24 hours a day, 7 days a week: 336-676-6840 [place], [place] °  °[facility] • Crisis Hotline, available 24 hours a day, 7 days a week: 800-256-2452 All °  °[facility] • Crisis Hotline, available 24 hours a day, 7 days a week: 877-626-1772 All  ° °Other Local Resources (Updated 12/2015) ° °Outpatient Counseling/ Substance Abuse Programs  °Services  ° °  °Address and Phone Number  °[facility] ° • Options include Individual counseling, group counseling, intensive outpatient program (several hours a day, several days a week) °• Offers depression assessments °• Provides methadone maintenance program 336-333-6860 °[address], [address] °[place], [place] [address] °  °[facility] ° • Offers partial hospitalization/day treatment and DUI/DWI programs °• Accepts Medicare, private insurance 336-299-4655 °[address], [address] °[place], [place] [address]  °[facility] ° ° • Services include intensive outpatient program (several hours a day, several days a week), outpatient treatment, DUI/DWI services, family education °• Also has some services specifically for Veterans °• Offers transitional housing  336-886-5594 °[address] °[place], [place] [address] °  °  ° [facility] • Accepts Medicare, private pay, and private insurance 336-272-0855 °[address] °[place], [place] [address]  °[facility] • Services include individual counseling, substance abuse intensive outpatient program (several hours a day, several  days a week), day treatment °• Accepts Medicare, Medicaid, private insurance 336-271-5888 °[address] °[place], [place] [address]  °[facility] ° • Offers substance abuse intensive outpatient program (several hours a day, several days a week), partial hospitalization program 336-832-9800 °[address] °[place], [place] [address] ° °336-349-4454 °[address] °[place], [place] [address] ° °336-386-3795 °[address] °[place], [place] [address] ° °336-993-6120 °[address] °[place], [place] [address]  °[facility] • Individual counseling only °• Accepts private insurance only 336-292-1510 °[address] °[place], [place] [address]  °[facility] • Methadone maintenance program 800-805-6989 °[address] °[place], [place] [address]  °[facility] • [facility] Clinic providing substance abuse and mental health counseling °• Accepts Medicaid, Medicare, private insurance °• Offers sliding scale for uninsured 336-342-8316 °[address] °[place], [place]   °[facility] • Offers individual counseling, and intensive in-home services 336-347-7415 °[address] °[place], [place] [address]  °[facility] • Offers individual counseling, family counseling, group therapy, domestic violence counseling, consumer credit counseling °• Accepts Medicare, Medicaid, private insurance °• Offers sliding scale for uninsured 336-387-6161 °[address] °[place], [place] [address] ° °336-889-6161 °[facility], [address] °[place], [place] [address]  °[facility] • Offers individual, family and group counseling °• 3 locations - [place], [place], and [place] ° 336-899-8800 ° °[address] °[place],  [address] ° °  [address] °[place], [place] [address] ° °[address] °[place], [place] [address]  °[facility]  ° • Offers psychiatric assessment, 8-week  Intensive Outpatient Program (several hours a day, several times a week, daytime or evenings), early recovery group, family Program, medication management °• Private pay or private insurance only 336 -621-3381, or  °800-659-3381 °[address] °[place], [place] [address]  °[facility] • Offers individual, couples and family counseling °• Accepts Medicaid, private insurance, and sliding scale for uninsured 336-542-2076 °[address] °[place], [place] [address]  °David Fuller, MD • Individual counseling °• Private insurance 336-852-4051 °[address] °[place], [place] [address]  °[facility] ° • Offers assessment, substance abuse treatment, and behavioral health treatment 336-878-6098 °[address] °[place], [place] [address]  °[facility] • Individual counseling °• Accepts private insurance 336-272-1972 °[address] °[place], [place] [address]  °[facility] • Individual counseling °• Accepts Medicare, private insurance 336-547-1574 °[address] °[place], [place] [address]  °[facility]  ° • Offers intensive outpatient program (several hours a day, several times a week) °• Private pay, private insurance [phone] °[address] °[place], [place]  °[facility] • Individual counseling °• Medicare, private insurance 336-505-9494 °[address] °[place], [place] [address]  °[facility]  ° • Offers intensive outpatient program (several hours a day, several times a week) and partial hospitalization program 336-794-3550 °[address] °[place], [place] [address]  °Parrish McKinney, MD • Individual counseling 336-282-1251 °[address] °[place], [place] [address]  °[facility] • Offers Christian counseling to individuals, couples, and families °• Accepts Medicare and private insurance; offers sliding scale for  uninsured 336-288-1484 °[address] °[place], [place] [address]  °[facility] • Christian counseling 336-542-2060 °[address] °[place], [place] [address]  °[facility] ° • Offers crisis counseling, individual counseling, group therapy, in-home therapy, domestic violence services, day treatment, DWI services, Community Support Team (CST), Assertive Community Treatment Team (ACTT), substance abuse Intensive Outpatient Program (several hours a day, several times a week) °• 2 locations - [place] and [place] 336-229-5905 °[address] °[place], [place] [address] ° °336-694-1777 °[address] °[place], [place] [address]  °[facility]  ° ° • Individual counseling and group therapy °• Accepts private insurance, Medicare, Medicaid 336-379-7146 °[address] °[place], [place]  °[facility] • Offers individual and family counseling °• Offers LGBTQ services °• Accepts private insurance and private pay 336-288-9190 °[address] °[place], [place] [address]  °[facility]  ° • Offers individual counseling, group therapy, and outpatient detox °• Accepts private insurance 336-389-1413 °[address] °[place], [place]  °[facility] • Individual counseling °• Accepts Medicare, private insurance 336-632-3505 °[address] °[place], [place] [address]  °[facility] • Individual counseling °• Accepts Medicare, private insurance 336-570-0104 °[address] °[place], [place] [address]  °[facility] ° • Offers substance abuse Intensive Outpatient Program (several hours a day, several times a week) [phone], or °[phone] °, [place]  ° °

## 2016-06-30 NOTE — ED Notes (Signed)
Pt ambulated to restroom with assistance.  

## 2016-06-30 NOTE — ED Notes (Signed)
IV team unable to get an IV.  MD and PA was informed.

## 2016-06-30 NOTE — ED Notes (Signed)
Spoke to [place], do not call when patient is discharged.  She will not pick up this patient. [phone]

## 2016-06-30 NOTE — ED Notes (Signed)
Lab to come and draw blood.  Several attempts were made by the ER staff.  MD made aware of delay in blood draws.

## 2016-06-30 NOTE — ED Notes (Signed)
Pt attempted to ambulate with assistance but was wobbly on feet; assisted back into bed

## 2016-06-30 NOTE — ED Notes (Signed)
i attempted to collect labs and was unsuccessful 

## 2016-06-30 NOTE — Progress Notes (Addendum)
Medicaid [place] access response hx indicates the assigned pcp is [facility] [place] [address] [place], [place] [id number] [phone]  Entered in d/c instructions  [facility] [place] Schedule an appointment as soon as possible for a visit As needed This is your assigned Medicaid [place] access doctor If you prefer to see another Medicaid doctor other than the one on your Medicaid card PLEASE CALL DSS [phone] or [phone]  Medicaid [place] access response hx indicates the [facility] [place] [address] [place] [phone]   medicaid covered patient  On 06/30/2016 Call your local medicaid/DSS office for [place] call [phone] As a Medicaid client you MUST contact DSS/SSI each time you change address, move to another [place] county or another state to keep your address updated  [place] Medicaid Transportation to Dr appts if you are have full Medicaid

## 2016-06-30 NOTE — ED Provider Notes (Signed)
CSN: [id number]     Arrival date & time 06/30/16  1117 History   First MD Initiated Contact with Patient 06/30/16 1202     Chief Complaint  Patient presents with  . Alcohol Intoxication     (Consider location/radiation/quality/duration/timing/severity/associated sxs/prior Treatment) Patient is a 55 y.o. female presenting with intoxication. The history is provided by the patient and medical records.  Alcohol Intoxication    LEVEL V CAVEAT:  INTOXICATION History provided by EMS and neighbor.  55 year old female with history of COPD, anxiety, arthritis, GERD, alcohol abuse, hepatitis C, presenting to the ED for acute intoxication. Patient's neighbor called EMS and reported that patient has been drinking for the past 4 days. She was seen in the ED 3 days ago for similar complaints and had a head and neck CT as well as repair of right index finger open dislocation.  Patient does have a hematoma of her right forhead which EMS reports is from her previous fall a few days ago. On arrival patient is drowsy but arousable to verbal and tactile stimuli. She does smell of alcohol.  Past Medical History  Diagnosis Date  . COPD (chronic obstructive pulmonary disease) (HCC)   . Anxiety   . Shortness of breath   . Arthritis   . GERD (gastroesophageal reflux disease)   . Full dentures   . Insomnia   . Alcohol abuse   . Hepatitis C   . Alcohol abuse   . History of MRSA infection   . History of encephalopathy    Past Surgical History  Procedure Laterality Date  . Arm surgery    . Hand surgery  1990    both rt/lt carpal tunnels  . Shoulder surgery      right and left-age [age]  . Orif ulnar / radial shaft fracture  1990    right-got infection-had total 10 surgeies 1990  . Orif ulnar fracture Left 05/23/2014    Procedure: OPEN REDUCTION INTERNAL FIXATION (ORIF) LEFT ULNAR FRACTURE;  Surgeon: Harvie JuniorJohn L Graves, MD;  Location: [facility];  Service: Orthopedics;  Laterality: Left;  . I&d  extremity Right 05/07/2015    Procedure: IRRIGATION AND DEBRIDEMENT  ELBOW ;  Surgeon: Dominica SeverinWilliam Gramig, MD;  Location: [facility];  Service: Orthopedics;  Laterality: Right;   Family History  Problem Relation Age of Onset  . Breast cancer Mother    Social History  Substance Use Topics  . Smoking status: Current Every Day Smoker -- 1.00 packs/day for 20 years    Types: Cigarettes  . Smokeless tobacco: Never Used  . Alcohol Use: Yes     Comment: 1/5th Vodka every day 06/27/16: "I drink 4 pints a day of vodka"   OB History    No data available     Review of Systems  Unable to perform ROS: Other      Allergies  Review of patient's allergies indicates no known allergies.  Home Medications   Prior to Admission medications   Medication Sig Start Date End Date Taking? Authorizing Provider  albuterol (PROVENTIL HFA;VENTOLIN HFA) 108 (90 Base) MCG/ACT inhaler Inhale 2 puffs into the lungs every 4 (four) hours as needed for wheezing or shortness of breath. 05/12/16   Maretta BeesShanker M Ghimire, MD  benztropine (COGENTIN) 1 MG tablet Take 1 tablet (1 mg total) by mouth 2 (two) times daily. 05/12/16   Shanker Levora DredgeM Ghimire, MD  budesonide-formoterol (SYMBICORT) 160-4.5 MCG/ACT inhaler Inhale 2 puffs into the lungs 2 (two) times daily. Reported on 02/20/2016 05/12/16  Shanker Levora Dredge, MD  cephALEXin (KEFLEX) 500 MG capsule Take 1 capsule (500 mg total) by mouth 4 (four) times daily. 06/27/16   Geoffery Lyons, MD  chlordiazePOXIDE (LIBRIUM) 25 MG capsule Take 1 capsule (25 mg total) by mouth 2 (two) times daily. 05/12/16   Shanker Levora Dredge, MD  feeding supplement, ENSURE ENLIVE, (ENSURE ENLIVE) LIQD Take 237 mLs by mouth 2 (two) times daily between meals. 05/12/16   Shanker Levora Dredge, MD  folic acid (FOLVITE) 1 MG tablet Take 1 tablet (1 mg total) by mouth daily. 05/12/16   Shanker Levora Dredge, MD  gabapentin (NEURONTIN) 400 MG capsule Take 1 capsule (400 mg total) by mouth 3 (three) times daily. 05/12/16   Shanker Levora Dredge, MD  haloperidol (HALDOL) 5 MG tablet Take 1 tablet (5 mg total) by mouth 2 (two) times daily. 05/12/16   Shanker Levora Dredge, MD  HYDROcodone-acetaminophen (NORCO) 5-325 MG tablet Take 1-2 tablets by mouth every 6 (six) hours as needed. 06/27/16   Geoffery Lyons, MD  Multiple Vitamin (MULTIVITAMIN WITH MINERALS) TABS tablet Take 1 tablet by mouth daily. 05/12/16   Shanker Levora Dredge, MD  thiamine 100 MG tablet Take 1 tablet (100 mg total) by mouth daily. 05/12/16   Shanker Levora Dredge, MD  tiotropium (SPIRIVA) 18 MCG inhalation capsule Place 1 capsule (18 mcg total) into inhaler and inhale daily. 05/12/16   Shanker Levora Dredge, MD   BP 115/69 mmHg  Pulse 94  Resp 24  SpO2 100%   Physical Exam  Constitutional: She appears well-developed and well-nourished.  Smells of EtOH  HENT:  Head: Normocephalic and atraumatic.  Mouth/Throat: Oropharynx is clear and moist.  Small hematoma and bruising of right forehead which appears old and in various stages of healing; mid-face stable; dentition overall intact; no hemotympanum  Eyes: Conjunctivae and EOM are normal. Pupils are equal, round, and reactive to light.  Neck: Normal range of motion and full passive range of motion without pain.  Cardiovascular: Normal rate, regular rhythm and normal heart sounds.   Pulmonary/Chest: Effort normal and breath sounds normal.  Abdominal: Soft. Bowel sounds are normal.  Musculoskeletal: Normal range of motion.  Right index finger in splint Multiple abraisons noted to feet and lower legs which appear old; no swelling or bony deformities  Neurological: She is alert.  Appears drowsy but arousable to verbal and tactile stimuli  Skin: Skin is warm and dry.  Psychiatric: She has a normal mood and affect.  Nursing note and vitals reviewed.   ED Course  Procedures (including critical care time) Labs Review Labs Reviewed  CBC WITH DIFFERENTIAL/PLATELET - Abnormal; Notable for the following:    WBC 18.6 (*)     Hemoglobin 11.7 (*)    HCT 35.7 (*)    RDW 16.7 (*)    Platelets 426 (*)    Neutro Abs 15.8 (*)    Monocytes Absolute 1.3 (*)    All other components within normal limits  URINALYSIS, ROUTINE W REFLEX MICROSCOPIC (NOT AT [facility]) - Abnormal; Notable for the following:    Hgb urine dipstick SMALL (*)    Ketones, ur >80 (*)    All other components within normal limits  URINE MICROSCOPIC-ADD ON - Abnormal; Notable for the following:    Squamous Epithelial / LPF 0-5 (*)    Bacteria, UA RARE (*)    All other components within normal limits    Imaging Review No results found. I have personally reviewed and evaluated these images and lab  results as part of my medical decision-making.   EKG Interpretation None      MDM   Final diagnoses:  Alcohol intoxication, uncomplicated (HCC)   56 y.o. F here acutely intoxicated.  She appears drowsy on arrival and smells of EtOH.  She is arousable to verbal and tactile stimuli.  Hematoma and bruising of her right forehead appears old and in various stages of healing.  I have reviewed her head CT from 3 days ago-- no acute findings, chronic subdural hematoma.  She has splint on her right index finger from prior injury.  Will obtain labs.  Allow to sober.  5:45 PM Multiple attempts have been made for blood draw by NT, RN, and phlebotomy.  We were able to run a CBC but all other blood samples clot in the tubes and cannot be run.  Patient has been stuck a total of 9 times so far.  Based on her CBC results i suspect this is due to dehydration and hemoconcentration.  Attempted to start and IV for IVF, however IV team unsuccessful.  Patient's VS remain stable.  U/a without signs of infection.  She does not appear septic.  She continues to awake intermittently and is more coherent at this time but is not able to walk without assistance and still appears intoxicated.  We have called her family, however no one will come to pick her up.  Will be allowed to sober  here.  Offered fluids and food here.  8:51 PM Patient remains clinically intoxicated at this time.  Still cannot ambulate with steady gait.  Will continue to allow to sober.  Care signed out to oncoming provider.  Anticipate discharge once clinically sober.  Case discussed with attending physician, Dr. Radford Pax, who evaluated patient and agrees with assessment and plan of care.  Garlon Hatchet, PA-C 06/30/16 2052  Nelva Nay, MD 07/01/16 [phone]

## 2016-06-30 NOTE — ED Notes (Signed)
Bed: [facility] Expected date:  Expected time:  Means of arrival:  Comments: EMS/ETOH 

## 2016-06-30 NOTE — ED Provider Notes (Signed)
11:15 PM Patient has been ambulatory in the ED without assistance without difficulty. She has been observed in the ED for approximately 10 hours. Vitals stable. No indication for further work up. Will discharge with return precautions.   Filed Vitals:   06/30/16 1845 06/30/16 2107 06/30/16 2248 06/30/16 2248  BP: 116/69 111/75 117/71 117/71  Pulse: 98 97 103 103  Temp: 97.9 F (36.6 C) 98.3 F (36.8 C)    TempSrc: Oral Oral    Resp: 18 16    SpO2: 99% 98% 98%      Antony MaduraKelly Blaine Guiffre, PA-C 07/01/16 [id number]  Benjiman CoreNathan Pickering, MD 07/02/16 [phone]

## 2016-06-30 NOTE — ED Notes (Addendum)
Per EMS, neighbor called and said patient has been drinking for the past 4 days.  Patient has some slurred speech.  Patient was up and walking waiting for EMS to arrive with neighbor.  Patient is from home. Patient denies any recent falls.  However, she has a raised knot on forehead.   BP:90/60   Sitting but lying was 112/70 HR:90 96% on room air   CBG:80

## 2016-07-06 ENCOUNTER — Emergency Department (HOSPITAL_BASED_OUTPATIENT_CLINIC_OR_DEPARTMENT_OTHER)
Admission: EM | Admit: 2016-07-06 | Discharge: 2016-07-06 | Disposition: A | Discharge status: Home / Self Care | Payer: Medicaid Other | Attending: Dermatology | Admitting: Dermatology

## 2016-07-06 ENCOUNTER — Encounter (HOSPITAL_BASED_OUTPATIENT_CLINIC_OR_DEPARTMENT_OTHER): Payer: Self-pay | Admitting: *Deleted

## 2016-07-06 DIAGNOSIS — J449 Chronic obstructive pulmonary disease, unspecified: Secondary | ICD-10-CM | POA: Insufficient documentation

## 2016-07-06 DIAGNOSIS — F17210 Nicotine dependence, cigarettes, uncomplicated: Secondary | ICD-10-CM | POA: Diagnosis not present

## 2016-07-06 DIAGNOSIS — M79641 Pain in right hand: Secondary | ICD-10-CM | POA: Insufficient documentation

## 2016-07-06 DIAGNOSIS — M1990 Unspecified osteoarthritis, unspecified site: Secondary | ICD-10-CM | POA: Diagnosis not present

## 2016-07-06 DIAGNOSIS — Z5321 Procedure and treatment not carried out due to patient leaving prior to being seen by health care provider: Secondary | ICD-10-CM | POA: Diagnosis not present

## 2016-07-06 NOTE — ED Notes (Signed)
Pain in her right index finger. She fell on 7/8 and had sutures to her finger. She feels it is infected.

## 2016-07-08 ENCOUNTER — Encounter (HOSPITAL_BASED_OUTPATIENT_CLINIC_OR_DEPARTMENT_OTHER): Payer: Self-pay | Admitting: *Deleted

## 2016-07-08 ENCOUNTER — Emergency Department (HOSPITAL_BASED_OUTPATIENT_CLINIC_OR_DEPARTMENT_OTHER): Payer: Medicaid Other

## 2016-07-08 ENCOUNTER — Emergency Department (HOSPITAL_BASED_OUTPATIENT_CLINIC_OR_DEPARTMENT_OTHER)
Admission: EM | Admit: 2016-07-08 | Discharge: 2016-07-08 | Disposition: A | Discharge status: Home / Self Care | Payer: Medicaid Other | Attending: Emergency Medicine | Admitting: Emergency Medicine

## 2016-07-08 DIAGNOSIS — R410 Disorientation, unspecified: Secondary | ICD-10-CM | POA: Insufficient documentation

## 2016-07-08 DIAGNOSIS — Z5189 Encounter for other specified aftercare: Secondary | ICD-10-CM

## 2016-07-08 DIAGNOSIS — Z4801 Encounter for change or removal of surgical wound dressing: Secondary | ICD-10-CM | POA: Insufficient documentation

## 2016-07-08 DIAGNOSIS — J449 Chronic obstructive pulmonary disease, unspecified: Secondary | ICD-10-CM | POA: Insufficient documentation

## 2016-07-08 DIAGNOSIS — F17210 Nicotine dependence, cigarettes, uncomplicated: Secondary | ICD-10-CM | POA: Diagnosis not present

## 2016-07-08 NOTE — ED Notes (Signed)
Pt reports laceration to her right second digit on July 8th, [facility]. Bandaid removed, finger is swollen, small amount yellow drainage noted on bandaid. Pt noted with strong odor of etoh, speech is slurred, pt repeats in elevated voice tones "I need to see a doctor right now!" explained that md will be in to see her soon, pt instructed to remain in bed and lower her voice. Pt denies any other c/o today.

## 2016-07-08 NOTE — ED Notes (Signed)
Pt verbalized her understanding of splinting and instructions on following up with her PCP in order to receive a referral to the hand surgeon. All instructions Hight Lighted for the patient in the AVS.

## 2016-07-08 NOTE — ED Notes (Signed)
MD at bedside. 

## 2016-07-08 NOTE — Discharge Instructions (Signed)
Follow-up again for recheck of the finger in 2-4 days. Sutures may be ready to come out at that time. Hand surgery one she did make an appointment to follow-up with your primary care doctor. Do that right away so that you can have a referral to them. Keep the splint in place. Return for any new or worse symptoms.

## 2016-07-08 NOTE — ED Notes (Signed)
Pt continues to need redirection back into her room. States she wants to leave. Xrays have been done awaiting results. EMT with patient for safety.

## 2016-07-08 NOTE — ED Provider Notes (Signed)
CSN: [id number]     Arrival date & time 07/08/16  [id number] History   First MD Initiated Contact with Patient 07/08/16 [phone]     Chief Complaint  Patient presents with  . Wound Check     (Consider location/radiation/quality/duration/timing/severity/associated sxs/prior Treatment) Patient is a 55 y.o. female presenting with wound check. The history is provided by the patient.  Wound Check Pertinent negatives include no chest pain, no abdominal pain, no headaches and no shortness of breath.  Patient brought in by friend for recheck of her finger injury. Patient status post fall on July 8 while intoxicated with open dislocation of her right second digit at the PIP joint. According to the notes it was the irrigated, sutures placed, discussion with on-call hand surgeon Dr. Merlyn LotKuzma, and they were going to followed up in the office. Patient states that they will not see her. Patient was started on Keflex and had a splint placed. Patient appears intoxicated today she is functional but requires a lot of redirection. Patient does not have her splint on.  Past Medical History  Diagnosis Date  . COPD (chronic obstructive pulmonary disease) (HCC)   . Anxiety   . Shortness of breath   . Arthritis   . GERD (gastroesophageal reflux disease)   . Full dentures   . Insomnia   . Alcohol abuse   . Hepatitis C   . Alcohol abuse   . History of MRSA infection   . History of encephalopathy    Past Surgical History  Procedure Laterality Date  . Arm surgery    . Hand surgery  1990    both rt/lt carpal tunnels  . Shoulder surgery      right and left-age [age]  . Orif ulnar / radial shaft fracture  1990    right-got infection-had total 10 surgeies 1990  . Orif ulnar fracture Left 05/23/2014    Procedure: OPEN REDUCTION INTERNAL FIXATION (ORIF) LEFT ULNAR FRACTURE;  Surgeon: Harvie JuniorJohn L Graves, MD;  Location: [facility];  Service: Orthopedics;  Laterality: Left;  . I&d extremity Right 05/07/2015   Procedure: IRRIGATION AND DEBRIDEMENT  ELBOW ;  Surgeon: Dominica SeverinWilliam Gramig, MD;  Location: [facility] OR;  Service: Orthopedics;  Laterality: Right;   Family History  Problem Relation Age of Onset  . Breast cancer Mother    Social History  Substance Use Topics  . Smoking status: Current Every Day Smoker -- 1.00 packs/day for 20 years    Types: Cigarettes  . Smokeless tobacco: Never Used  . Alcohol Use: Yes     Comment: 1/5th Vodka every day 06/27/16: "I drink 4 pints a day of vodka"   OB History    No data available     Review of Systems  Constitutional: Negative for fever.  HENT: Negative for congestion.   Eyes: Negative for visual disturbance.  Respiratory: Negative for shortness of breath.   Cardiovascular: Negative for chest pain.  Gastrointestinal: Negative for abdominal pain.  Genitourinary: Negative for dysuria.  Musculoskeletal: Negative for neck pain.  Skin: Positive for wound.  Neurological: Negative for headaches.  Hematological: Does not bruise/bleed easily.  Psychiatric/Behavioral: Positive for confusion.      Allergies  Review of patient's allergies indicates no known allergies.  Home Medications   Prior to Admission medications   Medication Sig Start Date End Date Taking? Authorizing Provider  albuterol (PROVENTIL HFA;VENTOLIN HFA) 108 (90 Base) MCG/ACT inhaler Inhale 2 puffs into the lungs every 4 (four) hours as needed for wheezing or shortness  of breath. 05/12/16   Maretta Bees, MD  benztropine (COGENTIN) 1 MG tablet Take 1 tablet (1 mg total) by mouth 2 (two) times daily. 05/12/16   Shanker Levora Dredge, MD  budesonide-formoterol (SYMBICORT) 160-4.5 MCG/ACT inhaler Inhale 2 puffs into the lungs 2 (two) times daily. Reported on 02/20/2016 05/12/16   Maretta Bees, MD  cephALEXin (KEFLEX) 500 MG capsule Take 1 capsule (500 mg total) by mouth 4 (four) times daily. 06/27/16   Geoffery Lyons, MD  chlordiazePOXIDE (LIBRIUM) 25 MG capsule Take 1 capsule (25 mg total) by  mouth 2 (two) times daily. 05/12/16   Shanker Levora Dredge, MD  feeding supplement, ENSURE ENLIVE, (ENSURE ENLIVE) LIQD Take 237 mLs by mouth 2 (two) times daily between meals. 05/12/16   Shanker Levora Dredge, MD  folic acid (FOLVITE) 1 MG tablet Take 1 tablet (1 mg total) by mouth daily. 05/12/16   Shanker Levora Dredge, MD  gabapentin (NEURONTIN) 400 MG capsule Take 1 capsule (400 mg total) by mouth 3 (three) times daily. 05/12/16   Shanker Levora Dredge, MD  haloperidol (HALDOL) 5 MG tablet Take 1 tablet (5 mg total) by mouth 2 (two) times daily. 05/12/16   Shanker Levora Dredge, MD  HYDROcodone-acetaminophen (NORCO) 5-325 MG tablet Take 1-2 tablets by mouth every 6 (six) hours as needed. 06/27/16   Geoffery Lyons, MD  Multiple Vitamin (MULTIVITAMIN WITH MINERALS) TABS tablet Take 1 tablet by mouth daily. 05/12/16   Shanker Levora Dredge, MD  thiamine 100 MG tablet Take 1 tablet (100 mg total) by mouth daily. 05/12/16   Shanker Levora Dredge, MD  tiotropium (SPIRIVA) 18 MCG inhalation capsule Place 1 capsule (18 mcg total) into inhaler and inhale daily. 05/12/16   Shanker Levora Dredge, MD   BP 101/66 mmHg  Pulse 66  Temp(Src) 97.6 F (36.4 C) (Oral)  Resp 20  Ht 5' (1.524 m)  Wt 50.803 kg  BMI 21.87 kg/m2 Physical Exam  Constitutional: She appears well-developed and well-nourished.  HENT:  Head: Normocephalic.  Left Ear: External ear normal.  Eyes: Conjunctivae and EOM are normal. Pupils are equal, round, and reactive to light.  Neck: Normal range of motion. Neck supple.  Cardiovascular: Normal rate, regular rhythm and normal heart sounds.   No murmur heard. Pulmonary/Chest: Effort normal and breath sounds normal. No respiratory distress.  Abdominal: Soft. Bowel sounds are normal. There is no tenderness.  Musculoskeletal: She exhibits edema and tenderness.  Normal except for right second finger. Sutures on the palmar side near the PIP joint. Swelling to that area no erythema. Good cap refill sensation appears to be intact  reasonable range of motion at the DIP and PIP and and MP joint. Sutures in place wound slightly dehisced but sealed some superficial of exudate but no evidence of any deep pus. No evidence of any  Teno synovitis.  Neurological: She is alert. No cranial nerve deficit. She exhibits normal muscle tone. Coordination normal.  Skin: No erythema.  Nursing note and vitals reviewed.   ED Course  Procedures (including critical care time) Labs Review Labs Reviewed - No data to display  Imaging Review Dg Hand Complete Right  07/08/2016  CLINICAL DATA:  Persistent pain after injury 1 month prior EXAM: RIGHT HAND - COMPLETE 3+ VIEW COMPARISON:  June 27, 2016 FINDINGS: Frontal, oblique, and lateral views obtained. The previous dislocation at the second PIP joint has been reduced successfully since prior study. Currently no fracture or dislocation is evident. There is extensive osteoarthritic change throughout all PIP  and DIP joints as well as the first MCP joint. No new arthropathy noted. No erosive change. No radiopaque foreign body. IMPRESSION: Interval reduction of second PIP joint dislocation. Currently no acute fracture or dislocation. Multifocal osteoarthritic change. No radiopaque foreign body. No bony destruction. Electronically Signed   By: Bretta Bang III M.D.   On: 07/08/2016 09:04   I have personally reviewed and evaluated these images and lab results as part of my medical decision-making.   EKG Interpretation None      MDM   Final diagnoses:  Encounter for wound re-check    Patient with injury to her right second digit open dislocation laceration on July 8. Taken care of by Dr. Judd Lien. Discussed with Dr. Merlyn Lot hand surgery at that time. Recommended washing it out very stents oblique closing up and they're to follow it up in the office. Patient never made it to follow-up. She claims that they would not see her. Sutures are still in place. However the wound looks as if it's not completely  healed. She was splinted she arrived here today without a splint. Since that time patient has been seen on July 11 and July 17 she left without being seen on July 17.  X-rays here today show no further dislocation. There is swelling at the PIP joint where the dislocation occurred. Stitches are intact. No erythema no significant swelling reasonable range of motion of the finger. Cap refill intact.  Due to the way the wound edges look do not recommend removing the sutures at this time yet even though it's been 10 days. Would recommend giving it 2-4 more days have patient come back for wound recheck.  Contacted Dr. Merrilee Seashore office of the slotted her for an appointment but they insist that she has to be seen by her primary care doctor because she's a Medicaid patient and needs a formal referral from them. Patient given these instructions.  Each of these visits starting on July 8 patient has been reported to be intoxicated. Patient appears to be intoxicated today but is functional.  As stated above no evidence of any significant deep space infection no evidence of any Tina synovitis.   Patient also was started on Keflex. Not clear whether she took any of that at all.    Vanetta Mulders, MD 07/08/16 [phone]

## 2016-07-08 NOTE — ED Notes (Signed)
Pt continues to yell out "Doctor Doctor" and has multiple attempts to walk out of room. Pt redirected. Security at the bedside. Will move pt to room 12 for better visualization.

## 2016-07-27 ENCOUNTER — Encounter (HOSPITAL_COMMUNITY): Payer: Self-pay | Admitting: Emergency Medicine

## 2016-07-27 ENCOUNTER — Emergency Department (HOSPITAL_COMMUNITY)
Admission: EM | Admit: 2016-07-27 | Discharge: 2016-08-21 | Disposition: E | Payer: Medicaid Other | Attending: Emergency Medicine | Admitting: Emergency Medicine

## 2016-07-27 DIAGNOSIS — J441 Chronic obstructive pulmonary disease with (acute) exacerbation: Secondary | ICD-10-CM | POA: Diagnosis not present

## 2016-07-27 DIAGNOSIS — IMO0001 Reserved for inherently not codable concepts without codable children: Secondary | ICD-10-CM

## 2016-07-27 DIAGNOSIS — F17210 Nicotine dependence, cigarettes, uncomplicated: Secondary | ICD-10-CM | POA: Diagnosis not present

## 2016-07-27 DIAGNOSIS — K920 Hematemesis: Secondary | ICD-10-CM | POA: Diagnosis not present

## 2016-07-27 DIAGNOSIS — Z79899 Other long term (current) drug therapy: Secondary | ICD-10-CM | POA: Insufficient documentation

## 2016-07-27 DIAGNOSIS — I469 Cardiac arrest, cause unspecified: Secondary | ICD-10-CM | POA: Insufficient documentation

## 2016-07-27 DIAGNOSIS — F1010 Alcohol abuse, uncomplicated: Secondary | ICD-10-CM | POA: Diagnosis not present

## 2016-07-27 DIAGNOSIS — F1020 Alcohol dependence, uncomplicated: Secondary | ICD-10-CM

## 2016-07-27 DIAGNOSIS — K922 Gastrointestinal hemorrhage, unspecified: Secondary | ICD-10-CM

## 2016-07-27 LAB — I-STAT CHEM 8, ED
BUN: 23 mg/dL — ABNORMAL HIGH (ref 6–20)
CALCIUM ION: 1.03 mmol/L — AB (ref 1.13–1.30)
Chloride: 94 mmol/L — ABNORMAL LOW (ref 101–111)
Creatinine, Ser: 1 mg/dL (ref 0.44–1.00)
GLUCOSE: 202 mg/dL — AB (ref 65–99)
HEMATOCRIT: 16 % — AB (ref 36.0–46.0)
Hemoglobin: 5.4 g/dL — CL (ref 12.0–15.0)
Potassium: 4.2 mmol/L (ref 3.5–5.1)
SODIUM: 127 mmol/L — AB (ref 135–145)
TCO2: 9 mmol/L (ref 0–100)

## 2016-07-27 LAB — I-STAT TROPONIN, ED: Troponin i, poc: 0.05 ng/mL (ref 0.00–0.08)

## 2016-07-27 LAB — I-STAT CG4 LACTIC ACID, ED

## 2016-07-27 MED ORDER — SODIUM CHLORIDE 0.9 % IV BOLUS (SEPSIS)
2000.0000 mL | Freq: Once | INTRAVENOUS | Status: AC
  Administered 2016-07-27: 2000 mL via INTRAVENOUS

## 2016-07-27 MED ORDER — EPINEPHRINE HCL 0.1 MG/ML IJ SOSY
PREFILLED_SYRINGE | INTRAMUSCULAR | Status: AC | PRN
  Administered 2016-07-27: 20 ug via INTRAVENOUS

## 2016-07-27 MED ORDER — SODIUM CHLORIDE 0.9 % IV SOLN
Freq: Once | INTRAVENOUS | Status: AC
  Administered 2016-07-27: 15:00:00 via INTRAVENOUS

## 2016-08-21 NOTE — Progress Notes (Signed)
   May 23, 2016 1703  Clinical Encounter Type  Visited With Family  Visit Type Death;ED  Referral From Physician  Consult/Referral To Chaplain  Spiritual Encounters  Spiritual Needs Grief support  Stress Factors  Family Stress Factors Loss  Chaplain responded to page, patient deceased, escorted back to consult with MD, provided spiritual presence, emotional support to staff and family, funeral preparation resources given.

## 2016-08-21 NOTE — ED Notes (Signed)
No palpable pulse, unable to get blood pressure reading. Pupils blown.

## 2016-08-21 NOTE — ED Triage Notes (Signed)
Pt in [facility] after being found down, pulseless by neighbor. EMS performed CPR x 1hr, gave 8 of Epi, 1 bolus cool fluid, with reported PEA and GCS of 3. Pt with Upper & Lower GIB per EMS. King airway present. Faint femoral pulses present on arrival, HR of 72, no BP present.

## 2016-08-21 NOTE — ED Provider Notes (Signed)
[facility]-EMERGENCY DEPT Provider Note   CSN: [id number] Arrival date & time: 07/31/2016  1506  First Provider Contact:  None       History   Chief Complaint Chief Complaint  Patient presents with  . Cardiac Arrest    HPI Rhonda Mitchell is a 55 y.o. female.  HPI  This 55 year old patient has a history of chronic hepatitis C, COPD, alcohol use and dependence, who presented in cardiac arrest.  The history is provided by EMS. She is reported to have been found, covered in blood, with bloody vomit by another person.  She was taken to the toilet, where she became unresponsive.  There was a downtime of 8 minutes between the patient becoming unresponsive and CPR being initiated.  EMS arrived to find her with GCS3, pulseless, and with an initial rhythm of PEA.  They secured provided CPR, including  of Epi x7, 1L of fluid, and airway secured with a King airway.  She received about 1 hour of CPR by EMS.  EMS states that they had a return of a faint pulse just before arriving to the Ed.  Upon arrival to the resuscitation bay, the patient was pulseless.  Past Medical History:  Diagnosis Date  . Alcohol abuse   . Alcohol abuse   . Anxiety   . Arthritis   . COPD (chronic obstructive pulmonary disease) (HCC)   . Full dentures   . GERD (gastroesophageal reflux disease)   . Hepatitis C   . History of encephalopathy   . History of MRSA infection   . Insomnia   . Shortness of breath     Patient Active Problem List   Diagnosis Date Noted  . Acute encephalopathy   . Centrilobular emphysema (HCC)   . Encephalopathy acute 04/23/2016  . Alcohol dependence with alcohol-induced mood disorder (HCC) 03/05/2016  . Abnormality of gait   . Lower extremity weakness   . Pressure ulcer 02/26/2016  . Tachypnea   . Depression   . Hypernatremia 02/20/2016  . Anemia 02/20/2016  . UTI (lower urinary tract infection)   . Abdominal pain, vomiting, and diarrhea 12/18/2015  . Alcohol abuse 12/18/2015  .  Abdominal pain 12/18/2015  . Elevated blood pressure 12/18/2015  . Severe recurrent major depression without psychotic features (HCC) 10/28/2015  . Alcohol dependence with uncomplicated withdrawal (HCC) 10/27/2015  . Alcohol-induced mood disorder (HCC) 10/27/2015  . Psychosis 08/21/2015  . Hypomagnesemia   . Protein-calorie malnutrition (HCC)   . Hepatitis C antibody test positive   . Elevated LFTs   . Hyperkalemia   . Severe protein-calorie malnutrition (HCC)   . Acute respiratory failure with hypoxia (HCC) 08/03/2015  . Septic olecranon bursitis of right elbow 05/08/2015  . Septic bursitis of elbow 05/07/2015  . Thrombocytopenia (HCC) 05/07/2015  . Chronic hepatitis C without hepatic coma (HCC) 05/07/2015  . Hypokalemia 05/07/2015  . Abscess of bursa of elbow 05/07/2015  . COPD exacerbation (HCC) 03/18/2015  . Hyponatremia 03/18/2015  . Hyperglycemia 03/18/2015  . Alcohol dependence (HCC) 12/07/2014  . Alcohol withdrawal (HCC) 12/07/2014  . Protein-calorie malnutrition, severe (HCC) 12/07/2014  . Musculoskeletal chest pain 12/07/2014  . Tobacco abuse 12/07/2014  . Carbuncle and furuncle 12/07/2014  . Macrocytic anemia 12/07/2014  . Chest pain 11/30/2014  . Tachycardia 11/30/2014  . COPD (chronic obstructive pulmonary disease) (HCC) 11/30/2014  . Cellulitis and abscess 11/30/2014  . Atypical chest pain   . Dehydration     Past Surgical History:  Procedure Laterality Date  .  arm surgery    . HAND SURGERY  1990   both rt/lt carpal tunnels  . I&D EXTREMITY Right 05/07/2015   Procedure: IRRIGATION AND DEBRIDEMENT  ELBOW ;  Surgeon: Dominica SeverinWilliam Gramig, MD;  Location: [facility] OR;  Service: Orthopedics;  Laterality: Right;  . ORIF ULNAR / RADIAL SHAFT FRACTURE  1990   right-got infection-had total 10 surgeies 1990  . ORIF ULNAR FRACTURE Left 05/23/2014   Procedure: OPEN REDUCTION INTERNAL FIXATION (ORIF) LEFT ULNAR FRACTURE;  Surgeon: Harvie JuniorJohn L Graves, MD;  Location: [facility];  Service: Orthopedics;  Laterality: Left;  . SHOULDER SURGERY     right and left-age [age]    OB History    No data available       Home Medications    Prior to Admission medications   Medication Sig Start Date End Date Taking? Authorizing Provider  albuterol (PROVENTIL HFA;VENTOLIN HFA) 108 (90 Base) MCG/ACT inhaler Inhale 2 puffs into the lungs every 4 (four) hours as needed for wheezing or shortness of breath. 05/12/16   Maretta BeesShanker M Ghimire, MD  benztropine (COGENTIN) 1 MG tablet Take 1 tablet (1 mg total) by mouth 2 (two) times daily. 05/12/16   Shanker Levora DredgeM Ghimire, MD  budesonide-formoterol (SYMBICORT) 160-4.5 MCG/ACT inhaler Inhale 2 puffs into the lungs 2 (two) times daily. Reported on 02/20/2016 05/12/16   Maretta BeesShanker M Ghimire, MD  cephALEXin (KEFLEX) 500 MG capsule Take 1 capsule (500 mg total) by mouth 4 (four) times daily. 06/27/16   Geoffery Lyonsouglas Delo, MD  chlordiazePOXIDE (LIBRIUM) 25 MG capsule Take 1 capsule (25 mg total) by mouth 2 (two) times daily. 05/12/16   Shanker Levora DredgeM Ghimire, MD  feeding supplement, ENSURE ENLIVE, (ENSURE ENLIVE) LIQD Take 237 mLs by mouth 2 (two) times daily between meals. 05/12/16   Shanker Levora DredgeM Ghimire, MD  folic acid (FOLVITE) 1 MG tablet Take 1 tablet (1 mg total) by mouth daily. 05/12/16   Shanker Levora DredgeM Ghimire, MD  gabapentin (NEURONTIN) 400 MG capsule Take 1 capsule (400 mg total) by mouth 3 (three) times daily. 05/12/16   Shanker Levora DredgeM Ghimire, MD  haloperidol (HALDOL) 5 MG tablet Take 1 tablet (5 mg total) by mouth 2 (two) times daily. 05/12/16   Shanker Levora DredgeM Ghimire, MD  HYDROcodone-acetaminophen (NORCO) 5-325 MG tablet Take 1-2 tablets by mouth every 6 (six) hours as needed. 06/27/16   Geoffery Lyonsouglas Delo, MD  Multiple Vitamin (MULTIVITAMIN WITH MINERALS) TABS tablet Take 1 tablet by mouth daily. 05/12/16   Shanker Levora DredgeM Ghimire, MD  thiamine 100 MG tablet Take 1 tablet (100 mg total) by mouth daily. 05/12/16   Shanker Levora DredgeM Ghimire, MD  tiotropium (SPIRIVA) 18 MCG inhalation capsule Place  1 capsule (18 mcg total) into inhaler and inhale daily. 05/12/16   Shanker Levora DredgeM Ghimire, MD    Family History Family History  Problem Relation Age of Onset  . Breast cancer Mother     Social History Social History  Substance Use Topics  . Smoking status: Current Every Day Smoker    Packs/day: 1.00    Years: 20.00    Types: Cigarettes  . Smokeless tobacco: Never Used  . Alcohol use Yes     Comment: 1/5th Vodka every day 06/27/16: "I drink 4 pints a day of vodka"     Allergies   Review of patient's allergies indicates no known allergies.   Review of Systems Review of Systems  Unable to perform ROS: Patient unresponsive     Physical Exam Updated Vital Signs BP (!) 38/20  Pulse 72 Comment: R Femoral  Resp (!) 9   Physical Exam  Constitutional: She has a sickly appearance. She appears distressed. She is intubated.  HENT:  Head: Normocephalic.  Gross blood covers the face and scalp and oropharynx, no noted trauma  Eyes: Right pupil is not reactive. Right pupil is round. Left pupil is not reactive. Left pupil is round. Pupils are equal.  Dilated  Cardiovascular: Normal rate and regular rhythm.   No murmur heard. Pulmonary/Chest: Effort normal. She is intubated.  No respiratory effort by the patient  Abdominal: Soft. She exhibits no distension.  Musculoskeletal: She exhibits no edema.  Neurological: She is unresponsive. GCS eye subscore is 1. GCS verbal subscore is 1. GCS motor subscore is 1.  No gag  Skin: There is pallor.  Cold skin   Nursing note and vitals reviewed.    ED Treatments / Results  Labs (all labs ordered are listed, but only abnormal results are displayed) Labs Reviewed  I-STAT CHEM 8, ED - Abnormal; Notable for the following:       Result Value   Sodium 127 (*)    Chloride 94 (*)    BUN 23 (*)    Glucose, Bld 202 (*)    Calcium, Ion 1.03 (*)    Hemoglobin 5.4 (*)    HCT 16.0 (*)    All other components within normal limits  I-STAT CG4  LACTIC ACID, ED - Abnormal; Notable for the following:    Lactic Acid, Venous >17.00 (*)    All other components within normal limits  I-STAT TROPOININ, ED  TYPE AND SCREEN    EKG  EKG Interpretation None       Radiology No results found.  Procedures Procedures (including critical care time)  Medications Ordered in ED Medications  sodium chloride 0.9 % bolus 2,000 mL (2,000 mLs Intravenous New Bag/Given Aug 20, 2016 1532)  0.9 %  sodium chloride infusion ( Intravenous New Bag/Given 08-20-2016 1520)  EPINEPHrine (ADRENALIN) 0.1 MG/ML injection (20 mcg Intravenous Given August 20, 2016 1523)     Initial Impression / Assessment and Plan / ED Course  I have reviewed the triage vital signs and the nursing notes.  Pertinent labs & imaging results that were available during my care of the patient were reviewed by me and considered in my medical decision making (see chart for details).  Clinical Course    Upon arrival to the resuscitation bay, the patient was found to be pulseless, and CPR was resumed.  Her exam was very concerning, with fixed and dilated pupils, cool extremities, and GCS of 3.  Her airway was noted to be secured with a King airway, that ventillated easily. There was no gag response to the king airway, and no sedative drugs had been administered to the patient Blood was ordered given concern for GI bleed.  ACLS was provided until a faint pulse was regained.  Cardiac activity on bedside ultrasound was agonal.  Labs were resulted and showed a hemoglobin of 5, which likely underestimates her real Hgb, and an intial lactic acid of >17.  She continued to be GCS 3, with dilated and unresponsive pupils, and blood pressure that was undetectable.  Pulses were lost.  Given the severity of her condition, length of downtime (8 minutes), length of resuscitative measures, and poor neurologic exam, further resuscitation would be futile.  Resuscitative measures were terminated, and the patient passed away  at 1555.  Final Clinical Impressions(s) / ED Diagnoses   Final diagnoses:  None  New Prescriptions New Prescriptions   No medications on file     Marcelina Morel, MD August 17, 2016 1618    Laurence Spates, MD 2016/08/17 [phone]

## 2016-08-21 DEATH — deceased

## 2017-05-11 IMAGING — CT CT HEAD W/O CM
1 of 2 series · 15 of 30 positions shown, 19 images · non-contrast
Comparison: CT of the head performed 03/15/2015

CLINICAL DATA: Acute onset of syncope. Loss of consciousness.
Multiple recent falls. Initial encounter.

EXAM:
CT HEAD WITHOUT CONTRAST
TECHNIQUE: Contiguous axial images were obtained from the base of the skull
through the vertex without intravenous contrast.

[Series 2: headseq 4.8 h45s · axial · 0.39mm/px · z∈[+965,+1120]mm · 15 of 36 slices shown, 19 images]
[im 2/36  brain]
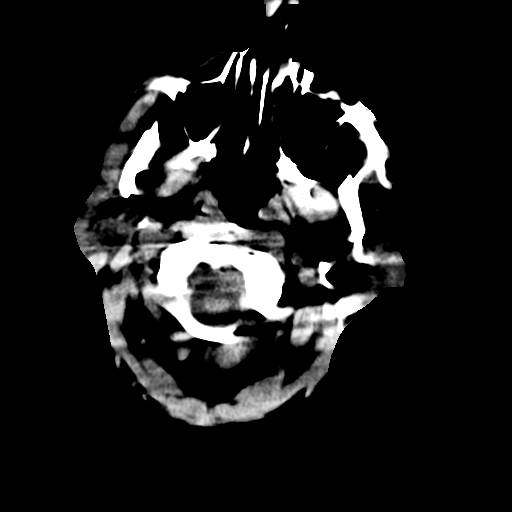
[im 2/36  bone]
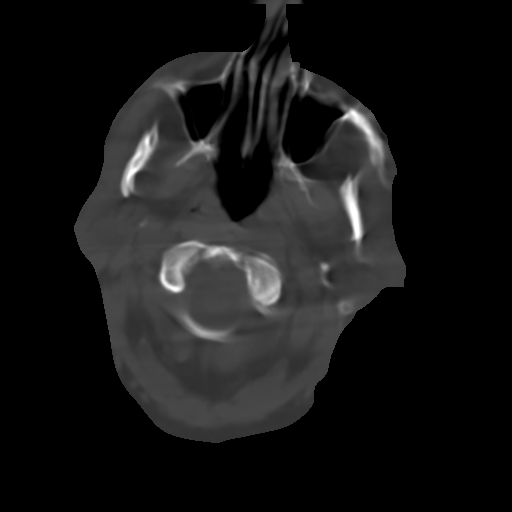
[im 6/36  brain]
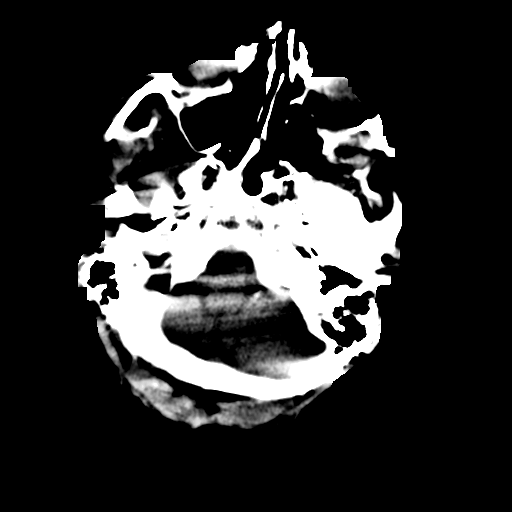
[im 7/36  brain]
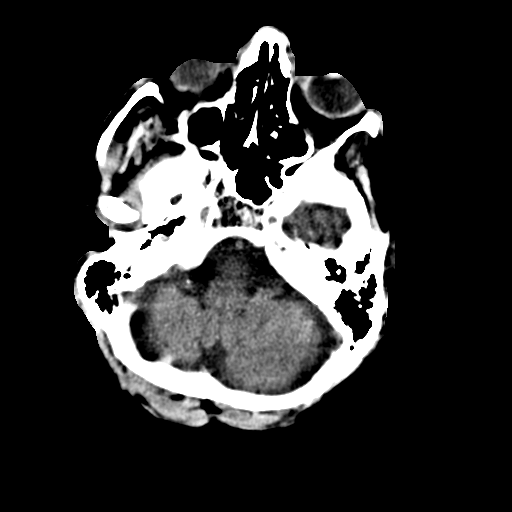
[im 9/36  brain]
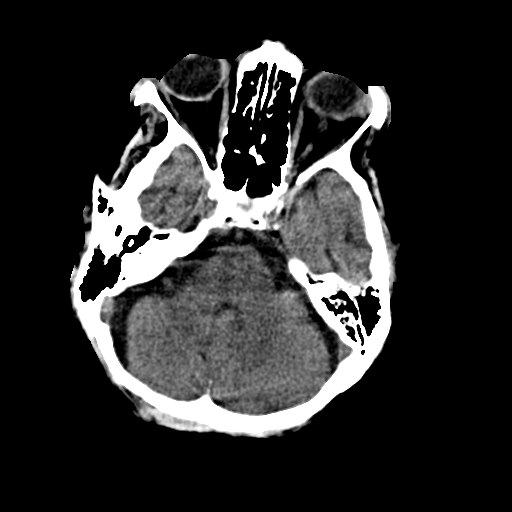
[im 12/36  brain]
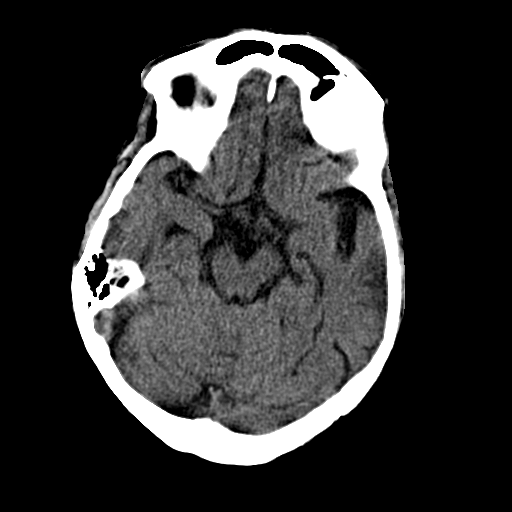
[im 12/36  bone]
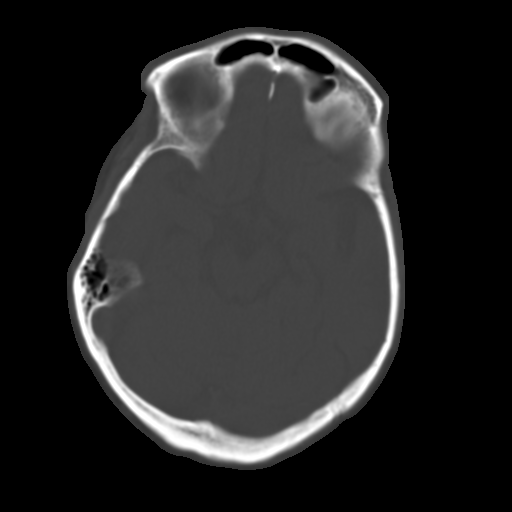
[im 14/36  brain]
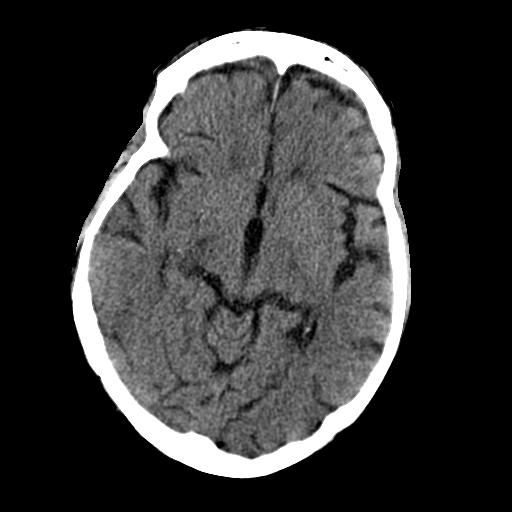
[im 16/36  brain]
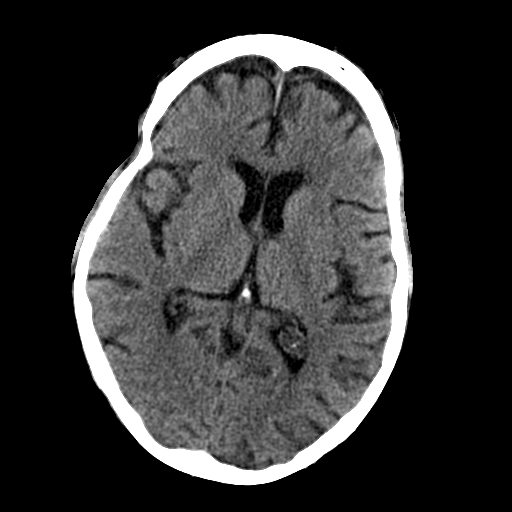
[im 19/36  brain]
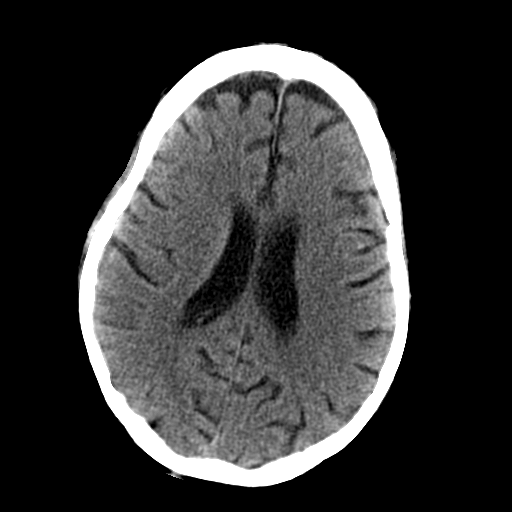
[im 21/36  brain]
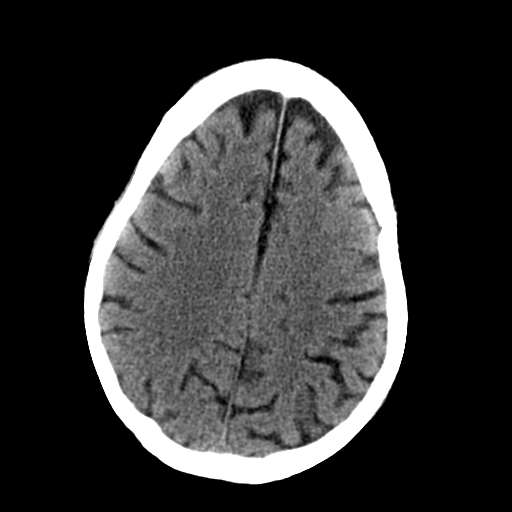
[im 21/36  bone]
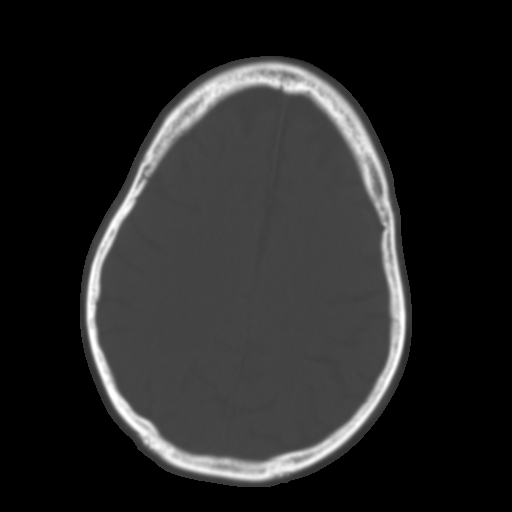
[im 22/36  brain]
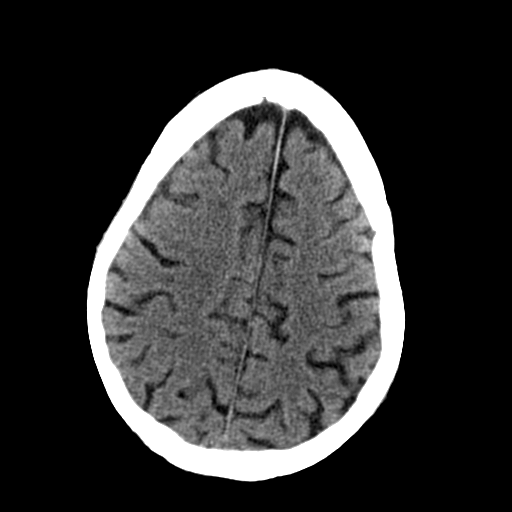
[im 26/36  brain]
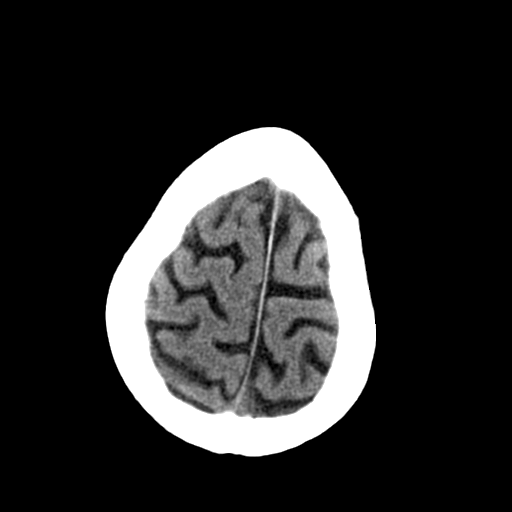
[im 27/36  brain]
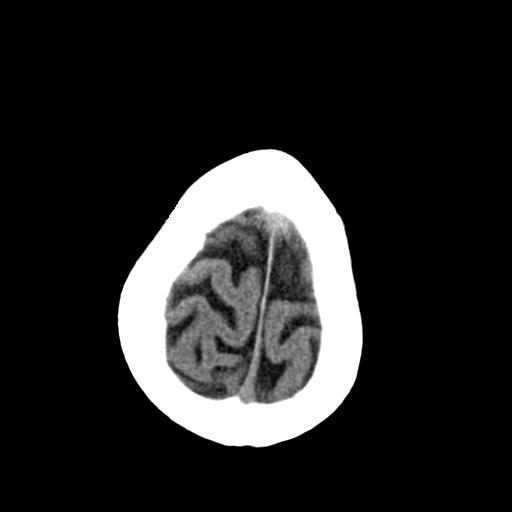
[im 29/36  brain]
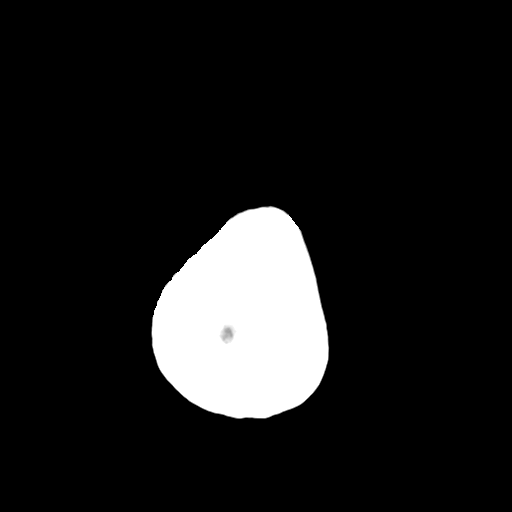
[im 29/36  bone]
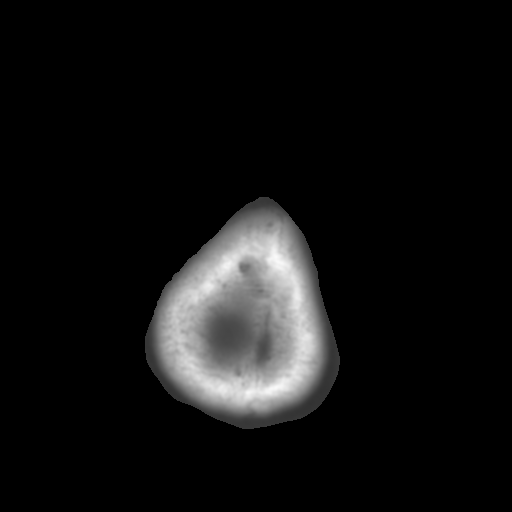
[im 32/36  brain]
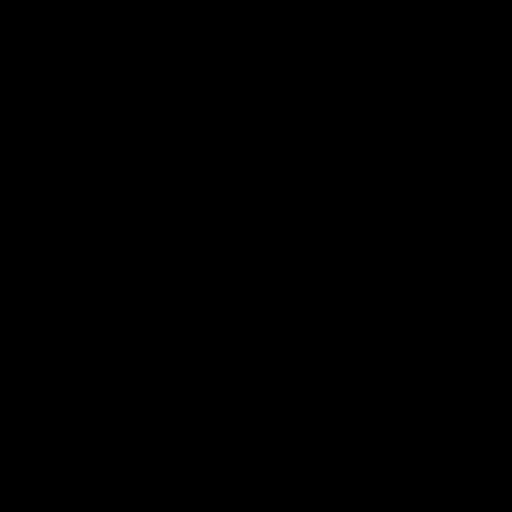
[im 34/36  brain]
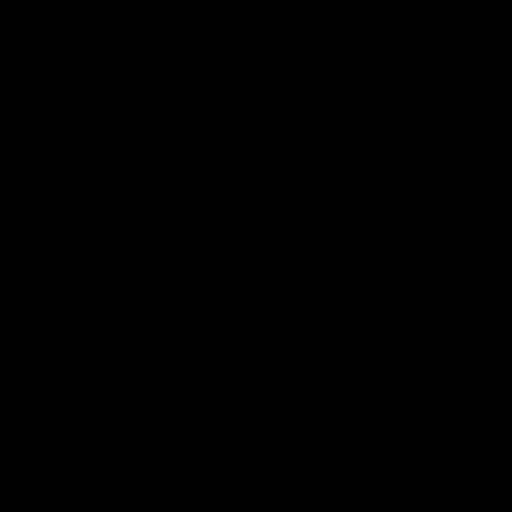

[15 of 30 positions shown; findings below may reference images not displayed]

FINDINGS: There is no evidence of acute infarction, mass lesion, or intra- or
extra-axial hemorrhage on CT.

Prominence of the ventricles and sulci reflects mild to moderate
cortical volume loss. Cerebellar atrophy is noted. Scattered
periventricular white matter change likely reflects small vessel
ischemic microangiopathy.

The brainstem and fourth ventricle are within normal limits. The
basal ganglia are unremarkable in appearance. The cerebral
hemispheres demonstrate grossly normal gray-white differentiation.
No mass effect or midline shift is seen.

There is no evidence of fracture; visualized osseous structures are
unremarkable in appearance. The orbits are within normal limits. The
paranasal sinuses and mastoid air cells are well-aerated. No
significant soft tissue abnormalities are seen.
IMPRESSION: 1. No evidence of traumatic intracranial injury or fracture.
2. Mild to moderate cortical volume loss and scattered small vessel
ischemic microangiopathy.
